# Patient Record
Sex: Male | Born: 1945 | Race: White | Hispanic: No | Marital: Married | Smoking: Current every day smoker
Health system: Southern US, Community
[De-identification: ages and names within clinical notes are randomized; demographics above are authoritative.]

## PROBLEM LIST (undated history)

## (undated) DIAGNOSIS — G4733 Obstructive sleep apnea (adult) (pediatric): Secondary | ICD-10-CM

## (undated) DIAGNOSIS — I10 Essential (primary) hypertension: Secondary | ICD-10-CM

## (undated) DIAGNOSIS — G250 Essential tremor: Secondary | ICD-10-CM

## (undated) DIAGNOSIS — E785 Hyperlipidemia, unspecified: Secondary | ICD-10-CM

## (undated) DIAGNOSIS — J441 Chronic obstructive pulmonary disease with (acute) exacerbation: Secondary | ICD-10-CM

## (undated) DIAGNOSIS — R809 Proteinuria, unspecified: Secondary | ICD-10-CM

## (undated) DIAGNOSIS — C159 Malignant neoplasm of esophagus, unspecified: Secondary | ICD-10-CM

## (undated) DIAGNOSIS — F32A Depression, unspecified: Secondary | ICD-10-CM

## (undated) DIAGNOSIS — M1990 Unspecified osteoarthritis, unspecified site: Secondary | ICD-10-CM

## (undated) DIAGNOSIS — C801 Malignant (primary) neoplasm, unspecified: Secondary | ICD-10-CM

## (undated) DIAGNOSIS — F329 Major depressive disorder, single episode, unspecified: Secondary | ICD-10-CM

## (undated) DIAGNOSIS — I2510 Atherosclerotic heart disease of native coronary artery without angina pectoris: Secondary | ICD-10-CM

## (undated) DIAGNOSIS — R0602 Shortness of breath: Secondary | ICD-10-CM

## (undated) DIAGNOSIS — F439 Reaction to severe stress, unspecified: Secondary | ICD-10-CM

## (undated) DIAGNOSIS — E291 Testicular hypofunction: Secondary | ICD-10-CM

## (undated) DIAGNOSIS — E119 Type 2 diabetes mellitus without complications: Secondary | ICD-10-CM

## (undated) DIAGNOSIS — J438 Other emphysema: Secondary | ICD-10-CM

## (undated) DIAGNOSIS — E7800 Pure hypercholesterolemia, unspecified: Secondary | ICD-10-CM

## (undated) HISTORY — DX: Obstructive sleep apnea (adult) (pediatric): G47.33

## (undated) HISTORY — DX: Type 2 diabetes mellitus without complications: E11.9

## (undated) HISTORY — DX: Depression, unspecified: F32.A

## (undated) HISTORY — DX: Testicular hypofunction: E29.1

## (undated) HISTORY — DX: Essential (primary) hypertension: I10

## (undated) HISTORY — DX: Hyperlipidemia, unspecified: E78.5

## (undated) HISTORY — PX: TONSILLECTOMY: SUR1361

## (undated) HISTORY — DX: Essential tremor: G25.0

## (undated) HISTORY — PX: CARDIAC CATHETERIZATION: SHX172

## (undated) HISTORY — DX: Shortness of breath: R06.02

## (undated) HISTORY — DX: Pure hypercholesterolemia, unspecified: E78.00

## (undated) HISTORY — PX: TONSILLECTOMY AND ADENOIDECTOMY: SHX28

## (undated) HISTORY — DX: Chronic obstructive pulmonary disease with (acute) exacerbation: J44.1

## (undated) HISTORY — DX: Atherosclerotic heart disease of native coronary artery without angina pectoris: I25.10

## (undated) HISTORY — PX: OTHER SURGICAL HISTORY: SHX169

## (undated) HISTORY — DX: Other emphysema: J43.8

## (undated) HISTORY — DX: Reaction to severe stress, unspecified: F43.9

---
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
## 2000-01-11 ENCOUNTER — Encounter: Admission: RE | Admit: 2000-01-11 | Discharge: 2000-01-11 | Payer: Self-pay | Admitting: Orthopedic Surgery

## 2000-01-11 ENCOUNTER — Encounter: Payer: Self-pay | Admitting: Orthopedic Surgery

## 2000-05-10 ENCOUNTER — Encounter: Payer: Self-pay | Admitting: Family Medicine

## 2000-05-10 ENCOUNTER — Encounter: Admission: RE | Admit: 2000-05-10 | Discharge: 2000-05-10 | Payer: Self-pay | Admitting: Family Medicine

## 2000-08-26 ENCOUNTER — Inpatient Hospital Stay (HOSPITAL_COMMUNITY): Admission: RE | Admit: 2000-08-26 | Discharge: 2000-08-29 | Payer: Self-pay | Admitting: Orthopedic Surgery

## 2000-09-13 ENCOUNTER — Ambulatory Visit (HOSPITAL_COMMUNITY): Admission: RE | Admit: 2000-09-13 | Discharge: 2000-09-13 | Payer: Self-pay | Admitting: Family Medicine

## 2002-07-03 ENCOUNTER — Inpatient Hospital Stay (HOSPITAL_COMMUNITY): Admission: EM | Admit: 2002-07-03 | Discharge: 2002-07-07 | Payer: Self-pay

## 2002-09-14 ENCOUNTER — Encounter: Admission: RE | Admit: 2002-09-14 | Discharge: 2002-09-14 | Payer: Self-pay | Admitting: Family Medicine

## 2002-09-14 ENCOUNTER — Encounter: Payer: Self-pay | Admitting: Family Medicine

## 2003-09-03 ENCOUNTER — Encounter: Admission: RE | Admit: 2003-09-03 | Discharge: 2003-09-03 | Payer: Self-pay | Admitting: Orthopedic Surgery

## 2003-10-02 ENCOUNTER — Inpatient Hospital Stay (HOSPITAL_COMMUNITY): Admission: EM | Admit: 2003-10-02 | Discharge: 2003-10-07 | Payer: Self-pay | Admitting: Emergency Medicine

## 2003-12-29 ENCOUNTER — Inpatient Hospital Stay (HOSPITAL_COMMUNITY): Admission: RE | Admit: 2003-12-29 | Discharge: 2004-01-01 | Payer: Self-pay | Admitting: Orthopedic Surgery

## 2005-12-17 ENCOUNTER — Observation Stay (HOSPITAL_COMMUNITY): Admission: EM | Admit: 2005-12-17 | Discharge: 2005-12-18 | Payer: Self-pay | Admitting: Emergency Medicine

## 2005-12-17 ENCOUNTER — Ambulatory Visit: Payer: Self-pay | Admitting: Cardiology

## 2005-12-31 ENCOUNTER — Ambulatory Visit: Payer: Self-pay | Admitting: Cardiology

## 2006-01-30 ENCOUNTER — Ambulatory Visit: Payer: Self-pay | Admitting: Gastroenterology

## 2006-02-13 ENCOUNTER — Ambulatory Visit (HOSPITAL_COMMUNITY): Admission: RE | Admit: 2006-02-13 | Discharge: 2006-02-13 | Payer: Self-pay | Admitting: Gastroenterology

## 2006-08-24 ENCOUNTER — Encounter: Admission: RE | Admit: 2006-08-24 | Discharge: 2006-08-24 | Payer: Self-pay | Admitting: Orthopedic Surgery

## 2006-11-06 ENCOUNTER — Encounter: Admission: RE | Admit: 2006-11-06 | Discharge: 2006-11-06 | Payer: Self-pay | Admitting: Family Medicine

## 2007-01-01 ENCOUNTER — Ambulatory Visit: Payer: Self-pay | Admitting: Cardiology

## 2008-02-24 ENCOUNTER — Ambulatory Visit: Payer: Self-pay

## 2008-03-31 ENCOUNTER — Ambulatory Visit: Payer: Self-pay | Admitting: Cardiology

## 2008-04-01 ENCOUNTER — Ambulatory Visit: Payer: Self-pay

## 2008-04-12 ENCOUNTER — Encounter: Admission: RE | Admit: 2008-04-12 | Discharge: 2008-05-10 | Payer: Self-pay | Admitting: Family Medicine

## 2008-07-07 ENCOUNTER — Encounter: Admission: RE | Admit: 2008-07-07 | Discharge: 2008-07-07 | Payer: Self-pay | Admitting: Family Medicine

## 2009-01-31 ENCOUNTER — Encounter (INDEPENDENT_AMBULATORY_CARE_PROVIDER_SITE_OTHER): Payer: Self-pay | Admitting: *Deleted

## 2009-08-08 ENCOUNTER — Encounter: Payer: Self-pay | Admitting: Pulmonary Disease

## 2009-08-31 ENCOUNTER — Ambulatory Visit: Payer: Self-pay | Admitting: Pulmonary Disease

## 2009-08-31 DIAGNOSIS — E119 Type 2 diabetes mellitus without complications: Secondary | ICD-10-CM | POA: Insufficient documentation

## 2009-08-31 DIAGNOSIS — I10 Essential (primary) hypertension: Secondary | ICD-10-CM | POA: Insufficient documentation

## 2009-08-31 DIAGNOSIS — E118 Type 2 diabetes mellitus with unspecified complications: Secondary | ICD-10-CM | POA: Insufficient documentation

## 2009-08-31 DIAGNOSIS — E785 Hyperlipidemia, unspecified: Secondary | ICD-10-CM | POA: Insufficient documentation

## 2009-08-31 DIAGNOSIS — J441 Chronic obstructive pulmonary disease with (acute) exacerbation: Secondary | ICD-10-CM | POA: Insufficient documentation

## 2009-08-31 DIAGNOSIS — G4733 Obstructive sleep apnea (adult) (pediatric): Secondary | ICD-10-CM | POA: Insufficient documentation

## 2009-08-31 DIAGNOSIS — R0602 Shortness of breath: Secondary | ICD-10-CM | POA: Insufficient documentation

## 2009-09-21 ENCOUNTER — Ambulatory Visit: Payer: Self-pay | Admitting: Pulmonary Disease

## 2009-09-21 ENCOUNTER — Encounter: Payer: Self-pay | Admitting: Pulmonary Disease

## 2009-09-21 DIAGNOSIS — J438 Other emphysema: Secondary | ICD-10-CM | POA: Insufficient documentation

## 2009-09-29 DIAGNOSIS — I2510 Atherosclerotic heart disease of native coronary artery without angina pectoris: Secondary | ICD-10-CM | POA: Insufficient documentation

## 2009-10-20 ENCOUNTER — Ambulatory Visit: Payer: Self-pay | Admitting: Cardiology

## 2009-10-20 DIAGNOSIS — F17200 Nicotine dependence, unspecified, uncomplicated: Secondary | ICD-10-CM | POA: Insufficient documentation

## 2009-12-28 ENCOUNTER — Telehealth (INDEPENDENT_AMBULATORY_CARE_PROVIDER_SITE_OTHER): Payer: Self-pay | Admitting: *Deleted

## 2009-12-28 ENCOUNTER — Telehealth: Payer: Self-pay | Admitting: Cardiology

## 2010-01-23 ENCOUNTER — Telehealth (INDEPENDENT_AMBULATORY_CARE_PROVIDER_SITE_OTHER): Payer: Self-pay | Admitting: *Deleted

## 2010-01-26 ENCOUNTER — Ambulatory Visit (HOSPITAL_COMMUNITY)
Admission: RE | Admit: 2010-01-26 | Discharge: 2010-01-27 | Payer: Self-pay | Source: Home / Self Care | Admitting: Orthopedic Surgery

## 2010-03-03 ENCOUNTER — Encounter: Admission: RE | Admit: 2010-03-03 | Discharge: 2010-05-10 | Payer: Self-pay | Admitting: Orthopedic Surgery

## 2010-07-04 ENCOUNTER — Telehealth: Payer: Self-pay | Admitting: Cardiology

## 2010-07-04 ENCOUNTER — Encounter: Payer: Self-pay | Admitting: Cardiology

## 2010-07-12 ENCOUNTER — Ambulatory Visit: Payer: Self-pay | Admitting: Cardiology

## 2010-07-12 DIAGNOSIS — R0789 Other chest pain: Secondary | ICD-10-CM | POA: Insufficient documentation

## 2010-07-17 ENCOUNTER — Telehealth (INDEPENDENT_AMBULATORY_CARE_PROVIDER_SITE_OTHER): Payer: Self-pay

## 2010-07-18 ENCOUNTER — Encounter: Payer: Self-pay | Admitting: Cardiovascular Disease

## 2010-07-18 ENCOUNTER — Encounter (HOSPITAL_COMMUNITY): Admission: RE | Admit: 2010-07-18 | Payer: Self-pay | Admitting: Cardiology

## 2010-07-18 ENCOUNTER — Ambulatory Visit: Payer: Self-pay | Admitting: Cardiovascular Disease

## 2010-07-18 ENCOUNTER — Ambulatory Visit: Payer: Self-pay

## 2010-07-20 ENCOUNTER — Telehealth: Payer: Self-pay | Admitting: Cardiology

## 2010-09-02 ENCOUNTER — Encounter: Payer: Self-pay | Admitting: Cardiology

## 2010-09-04 ENCOUNTER — Encounter: Payer: Self-pay | Admitting: Physician Assistant

## 2010-09-04 ENCOUNTER — Ambulatory Visit: Payer: Self-pay | Admitting: Physician Assistant

## 2010-09-05 ENCOUNTER — Ambulatory Visit: Payer: Self-pay | Admitting: Pulmonary Disease

## 2010-09-06 ENCOUNTER — Telehealth: Payer: Self-pay | Admitting: Cardiology

## 2010-09-12 LAB — CONVERTED CEMR LAB
BUN: 17 mg/dL (ref 6–23)
CO2: 27 meq/L (ref 19–32)
Calcium: 9.7 mg/dL (ref 8.4–10.5)
Chloride: 96 meq/L (ref 96–112)
Creatinine, Ser: 0.9 mg/dL (ref 0.4–1.5)
GFR calc non Af Amer: 93.44 mL/min (ref >60.00)
Glucose, Bld: 255 mg/dL — ABNORMAL HIGH (ref 70–99)
Potassium: 4.2 meq/L (ref 3.5–5.1)
Pro B Natriuretic peptide (BNP): 18.3 pg/mL (ref 0.0–100.0)
Sodium: 134 meq/L — ABNORMAL LOW (ref 135–145)

## 2010-09-13 ENCOUNTER — Ambulatory Visit (HOSPITAL_COMMUNITY)
Admission: RE | Admit: 2010-09-13 | Discharge: 2010-09-13 | Payer: Self-pay | Source: Home / Self Care | Attending: Cardiology | Admitting: Cardiology

## 2010-09-13 ENCOUNTER — Ambulatory Visit: Payer: Self-pay

## 2010-09-13 ENCOUNTER — Encounter: Payer: Self-pay | Admitting: Cardiology

## 2010-09-15 ENCOUNTER — Encounter: Payer: Self-pay | Admitting: Physician Assistant

## 2010-09-20 ENCOUNTER — Ambulatory Visit: Admit: 2010-09-20 | Payer: Self-pay | Admitting: Physician Assistant

## 2010-09-21 ENCOUNTER — Telehealth: Payer: Self-pay | Admitting: Cardiology

## 2010-09-26 ENCOUNTER — Ambulatory Visit
Admission: RE | Admit: 2010-09-26 | Discharge: 2010-09-26 | Payer: Self-pay | Source: Home / Self Care | Attending: Adult Health | Admitting: Adult Health

## 2010-10-08 ENCOUNTER — Encounter: Payer: Self-pay | Admitting: Gastroenterology

## 2010-10-19 NOTE — Progress Notes (Signed)
Summary: Nuc. Pre-Procedure  Phone Note Outgoing Call Call back at [facility] Phone [phone]   Call placed by: Irean Hong, RN,  July 17, 2010 2:39 PM Summary of Call: Left message with information on Myov[employer] Information Sheet (see scanned document for details).      Nuclear Med Background Indications for Stress Test: Evaluation for Ischemia   History: COPD, Emphysema, Heart Catheterization, Myocardial Perfusion Study  History Comments: 4/07 Cath: N/O CAD, EF NL. 7/09 MPS: Low risk, EF=57%.  Symptoms: Chest Pressure  Symptoms Comments: Radiates to neck.   Nuclear Pre-Procedure Cardiac Risk Factors: Family History - CAD, Hypertension, Lipids, NIDDM, Smoker Height (in): 67

## 2010-10-19 NOTE — Miscellaneous (Signed)
  Clinical Lists Changes  Observations: Added new observation of PAST MED HX: CAD, NATIVE VESSEL (ICD-414.01) EMPHYSEMA (ICD-492.8) DYSPNEA (ICD-786.05) CHRONIC OBSTRUCTIVE PULMONARY DISEASE, ACUTE EXACERBATION (ICD-491.21) OBSTRUCTIVE SLEEP APNEA (ICD-327.23) DM (ICD-250.00) HYPERLIPIDEMIA (ICD-272.4) HYPERTENSION (ICD-401.9) Echo 08/2010: EF 55-60%; Grade 1 Diast. Dysfxn; mild BAE  (09/15/2010 9:10)       Past History:  Past Medical History: CAD, NATIVE VESSEL (ICD-414.01) EMPHYSEMA (ICD-492.8) DYSPNEA (ICD-786.05) CHRONIC OBSTRUCTIVE PULMONARY DISEASE, ACUTE EXACERBATION (ICD-491.21) OBSTRUCTIVE SLEEP APNEA (ICD-327.23) DM (ICD-250.00) HYPERLIPIDEMIA (ICD-272.4) HYPERTENSION (ICD-401.9) Echo 08/2010: EF 55-60%; Grade 1 Diast. Dysfxn; mild BAE

## 2010-10-19 NOTE — Progress Notes (Signed)
Summary: surgical clearance  Phone Note Call from Patient Call back at Home Phone [phone] Call back at [phone]   Caller: Patient Call For: clance Reason for Call: Talk to Nurse Summary of Call: pt needs surgical clearance for shoulder surgery sent to [facility].  Dr. Rennis Chris  -  Fax # [phone] Initial call taken by: Eugene Gavia,  December 28, 2009 3:14 PM  Follow-up for Phone Call        Pt last seen in 09/2009. Pt was to return in 4 months. Please advise if pt will need a sooner appt to get surgical clearance.Michel Bickers [facility]  December 28, 2009 4:09 PM  Additional Follow-up for Phone Call Additional follow up Details #1::        pt has minimal lung disease, and no reason he can't have surgery.  needs to quit smoking 2 weeks prior to general anesthesia. doesn't neeed ov for clearance. Additional Follow-up by: Barbaraann Share MD,  December 28, 2009 5:34 PM    Additional Follow-up for Phone Call Additional follow up Details #2::    This phone note faxed to Dr Tami Lin with Dr Teddy Spike opinion. Follow-up by: Abigail Miyamoto RN,  December 29, 2009 9:21 AM

## 2010-10-19 NOTE — Assessment & Plan Note (Signed)
Summary: rov to review pfts for w/u of doe   Copy to:  Justin Trujillo Primary Provider/Referring Provider:  Nadyne Trujillo  CC:  Pt is here for a f/u appt to discuss PFT results.  Pt states breathing has improved but unsure if it's the Spiriva or Symbicort that has helped.  Pt states he is not needing to use his nebulizer as much.  Pt states he has decreased his smoking to 1/4 ppd or less. Justin Trujillo  History of Present Illness: The pt comes in today for review of his full pfts.  At the last visit, his bronchodilator regimen was intensifed, he was treated with a course of prednisone, and was urged to stop smoking.  He has cut back since the last visit, but not quit.  His pfts show no obstruction by FEV1%, but he did have mild airtrapping on his lung volumes.  There was no restriction, and his DLCO was normal.  I have gone over the study with him in detail, and answered all of his questions.  Current Medications (verified): 1)  Wellbutrin Sr 150 Mg Xr12h-Tab (Bupropion Hcl) .... Take 1 Tablet By Mouth Two Times A Day 2)  Hydrochlorothiazide 25 Mg Tabs (Hydrochlorothiazide) .... Take 1 Tablet By Mouth Once A Day 3)  Crestor 10 Mg  Tabs (Rosuvastatin Calcium) .... Take 1 Tablet By Mouth Once A Day 4)  Lotensin 10 Mg Tabs (Benazepril Hcl) .... Take 1 Tablet By Mouth Once A Day 5)  Actoplus Met 15-850 Mg Tabs (Pioglitazone Hcl-Metformin Hcl) .... Take 1 Tablet By Mouth Two Times A Day 6)  Spiriva Handihaler 18 Mcg  Caps (Tiotropium Bromide Monohydrate) .... Inhale Contents of 1 Capsule Once A Day 7)  Albuterol Sulfate (2.5 Mg/62ml) 0.083% Nebu (Albuterol Sulfate) .Justin Trujillo.. 1 Vial in Nebulizer As Needed 8)  Mucinex 600 Mg Xr12h-Tab (Guaifenesin) 9)  Flonase 50 Mcg/act  Susp (Fluticasone Propionate) .Justin Trujillo.. 1 Spray in Each Nostril Once Daily 10)  Vicodin .... As Needed 11)  Percocet .... As Needed 12)  Symbicort 160-4.5 Mcg/act  Aero (Budesonide-Formoterol Fumarate) .... Two Puffs Twice Daily 13)  Mobic 15 Mg Tabs  (Meloxicam) .... As Needed 14)  Flexeril 5 Mg Tabs (Cyclobenzaprine Hcl) .... As Needed 15)  Combivent 103-18 Mcg/act Aero (Ipratropium-Albuterol) .... As Needed  Allergies (verified): 1)  ! * Ivp Dye 2)  ! * Latex 3)  ! Adhesive Tape  Social History: Patient is a current smoker.  started 1966.  prev smoked 3 ppd.  currently at 1/4 ppd or less. pt is married. pt works as a Engineer, maintenance (IT).  Vital Signs:  Patient profile:   65 year old male Height:      67 inches Weight:      268 pounds BMI:     42.13 O2 Sat:      93 % on Room air Temp:     97.5 degrees F oral Pulse rate:   92 / minute BP sitting:   124 / 82  (left arm) Cuff size:   large  Vitals Entered By: Arman Filter LPN (September 21, 2009 10:14 AM)  O2 Flow:  Room air CC: Pt is here for a f/u appt to discuss PFT results.  Pt states breathing has improved but unsure if it's the Spiriva or Symbicort that has helped.  Pt states he is not needing to use his nebulizer as much.  Pt states he has decreased his smoking to 1/4 ppd or less.  Comments Medications reviewed with patient Aundra Millet  Reynolds LPN  September 21, 2009 10:15 AM    Physical Exam  General:  obese male in nad   Impression & Recommendations:  Problem # 1:  DYSPNEA (ICD-786.05)  the pt surprisingly has only mild airflow obstruction manifested as airtrapping.  I suspect he does have some emphysema, but the majority of the problem is ongoing airway inflammation from his smoking.  I have stressed to him the importance of total smoking cessation, and will keep him on symbicort for airway inflammation until he can quit smoking.  Regarding his sob, his symptoms are out of proportion to his objective findings on pfts.  Therefore, either his weight and conditioning are playing major roles, or could he have underlying cardiac disease that is contributing?  I have asked him to make an appt with Dr. Daleen Squibb for a checkup, and will leave to his discretion whether to pursue  noninvasive testing.  Time spent with pt today discussing the above was  Medications Added to Medication List This Visit: 1)  Albuterol Sulfate (2.5 Mg/66ml) 0.083% Nebu (Albuterol sulfate) .Justin Trujillo.. 1 vial in nebulizer as needed 2)  Symbicort 160-4.5 Mcg/act Aero (Budesonide-formoterol fumarate) .... Two puffs twice daily 3)  Mobic 15 Mg Tabs (Meloxicam) .... As needed 4)  Flexeril 5 Mg Tabs (Cyclobenzaprine hcl) .... As needed 5)  Combivent 103-18 Mcg/act Aero (Ipratropium-albuterol) .... As needed  Other Orders: Est. Patient Level III (16109)  Patient Instructions: 1)  stay on symbicort and combivent for rescue 2)  stop spiriva 3)  stop smoking 4)  work on weight loss and conditioning. 5)  make an apptm with Dr. Daleen Squibb to check on your cardiac condition 6)  Please schedule a follow-up appointment in 4 months.   Prescriptions: SYMBICORT 160-4.5 MCG/ACT  AERO (BUDESONIDE-FORMOTEROL FUMARATE) Two puffs twice daily  #1 x 6   Entered and Authorized by:   Barbaraann Share MD   Signed by:   Barbaraann Share MD on 09/21/2009   Method used:   Print then Give to Patient   RxID:   [id number]

## 2010-10-19 NOTE — Assessment & Plan Note (Signed)
Summary: Acute NP office visit - bronchitis   Copy to:  Nadyne Coombes Primary Provider/Referring Provider:  Nadyne Coombes  CC:  went to UC x3 w/ URI symptoms: laryngitis, prod cough with yellow/white mucus, wheezing, DOE, and tightness in chest x4-6weeks - was given zpak and pred taper from UC.  History of Present Illness: 64 yo with known hx of COPD   09/2009--The pt comes in today for review of his full pfts.  At the last visit, his bronchodilator regimen was intensifed, he was treated with a course of prednisone, and was urged to stop smoking.  He has cut back since the last visit, but not quit.  His pfts show no obstruction by FEV1%, but he did have mild airtrapping on his lung volumes.  There was no restriction, and his DLCO was normal.  I have gone over the study with him in detail, and answered all of his questions.  September 05, 2010--Presents for an acute office visit. Complains of cough x 6 weeks. Has  went to UC x3 w/ URI symptoms: laryngitis, prod cough with yellow/white mucus, wheezing, DOE, tightness in chest x4-6weeks - was given zpak and pred taper from UC. Reports he had 2 xray with no PNA or acute findings. No records are available.  Has couple of days left of steroids and zpack.  Spiriva was stopped in 09/2009, however pt restarted on own 1 month ago.  Has decreased smoking 3/4 ppd (prev. has been as much as 3 ppd).  Denies chest pain,  orthopnea, hemoptysis, fever, n/v/d, edema, headache.   Medications Prior to Update: 1)  Hydrochlorothiazide 25 Mg Tabs (Hydrochlorothiazide) .... Take 1 Tablet By Mouth Once A Day 2)  Crestor 10 Mg  Tabs (Rosuvastatin Calcium) .... Take 1 Tablet By Mouth Once A Day 3)  Lotensin 10 Mg Tabs (Benazepril Hcl) .... Take 1 Tablet By Mouth Once A Day 4)  Actoplus Met 15-850 Mg Tabs (Pioglitazone Hcl-Metformin Hcl) .... Take 1 Tablet By Mouth Two Times A Day 5)  Albuterol Sulfate (2.5 Mg/59ml) 0.083% Nebu (Albuterol Sulfate) .Marland Kitchen.. 1 Vial in Nebulizer  As Needed 6)  Vicodin Es 7.5-750 Mg Tabs (Hydrocodone-Acetaminophen) .... As Needed 7)  Percocet 10-325 Mg Tabs (Oxycodone-Acetaminophen) .... As Needed 8)  Mobic 15 Mg Tabs (Meloxicam) .... As Needed 9)  Flexeril 10 Mg Tabs (Cyclobenzaprine Hcl) .... As Needed 10)  Combivent 103-18 Mcg/act Aero (Ipratropium-Albuterol) .... As Needed 11)  Androgel Pump 1.25 Gm/act (1%) Gel (Testosterone) .... As Directed 12)  Cialis 5 Mg Tabs (Tadalafil) .Marland Kitchen.. 1 Tab Once Daily 13)  Symbicort 80-4.5 Mcg/act Aero (Budesonide-Formoterol Fumarate) .... Daily 14)  Spiriva Handihaler 18 Mcg Caps (Tiotropium Bromide Monohydrate) .... Daily 15)  Prednisone (Pak) 10 Mg Tabs (Prednisone) .... Uad  Current Medications (verified): 1)  Hydrochlorothiazide 25 Mg Tabs (Hydrochlorothiazide) .... Take 1 Tablet By Mouth Once A Day 2)  Crestor 10 Mg  Tabs (Rosuvastatin Calcium) .... Take 1 Tablet By Mouth Once A Day 3)  Lotensin 10 Mg Tabs (Benazepril Hcl) .... Take 1 Tablet By Mouth Once A Day 4)  Actoplus Met 15-850 Mg Tabs (Pioglitazone Hcl-Metformin Hcl) .... Take 1 Tablet By Mouth Two Times A Day 5)  Albuterol Sulfate (2.5 Mg/25ml) 0.083% Nebu (Albuterol Sulfate) .Marland Kitchen.. 1 Vial in Nebulizer As Needed 6)  Vicodin Es 7.5-750 Mg Tabs (Hydrocodone-Acetaminophen) .... As Needed 7)  Percocet 10-325 Mg Tabs (Oxycodone-Acetaminophen) .... As Needed 8)  Mobic 15 Mg Tabs (Meloxicam) .... As Needed 9)  Flexeril 10 Mg Tabs (Cyclobenzaprine  Hcl) .... As Needed 10)  Combivent 103-18 Mcg/act Aero (Ipratropium-Albuterol) .... As Needed 11)  Androgel Pump 1.25 Gm/act (1%) Gel (Testosterone) .... As Directed 12)  Symbicort 80-4.5 Mcg/act Aero (Budesonide-Formoterol Fumarate) .... Daily 13)  Spiriva Handihaler 18 Mcg Caps (Tiotropium Bromide Monohydrate) .... Daily 14)  Prednisone (Pak) 10 Mg Tabs (Prednisone) .... Uad  Allergies (verified): 1)  ! * Ivp Dye 2)  ! * Avelox 3)  ! * Latex 4)  ! Adhesive Tape  Past History:  Past Medical  History: Last updated: 09/29/2009 CAD, NATIVE VESSEL (ICD-414.01) EMPHYSEMA (ICD-492.8) DYSPNEA (ICD-786.05) CHRONIC OBSTRUCTIVE PULMONARY DISEASE, ACUTE EXACERBATION (ICD-491.21) OBSTRUCTIVE SLEEP APNEA (ICD-327.23) DM (ICD-250.00) HYPERLIPIDEMIA (ICD-272.4) HYPERTENSION (ICD-401.9)    Past Surgical History: Last updated: 09/29/2009 L lens implant vascectomy R knee replacement x 2 tonsillectomy and adenoids  Family History: Last updated: 08/31/2009 emphysema: father, mother heart disease: father (m.i.), paternal side of family clotting disorders: maternal side of family   Social History: Last updated: 09/21/2009 Patient is a current smoker.  started 1966.  prev smoked 3 ppd.  currently at 1/4 ppd or less. pt is married. pt works as a Engineer, maintenance (IT).  Risk Factors: Smoking Status: current (08/31/2009)  Review of Systems      See HPI  Vital Signs:  Patient profile:   65 year old male Height:      67 inches Weight:      265.13 pounds BMI:     41.68 O2 Sat:      96 % on Room air Temp:     97.1 degrees F oral Pulse rate:   111 / minute BP sitting:   140 / 90  (right arm) Cuff size:   large  Vitals Entered By: Boone Master CNA/MA (September 05, 2010 4:05 PM)  O2 Flow:  Room air CC: went to UC x3 w/ URI symptoms: laryngitis, prod cough with yellow/white mucus, wheezing, DOE, tightness in chest x4-6weeks - was given zpak and pred taper from UC Is Patient Diabetic? No Comments Medications reviewed with patient Daytime contact number verified with patient. Boone Master CNA/MA  September 05, 2010 4:07 PM    Physical Exam  Additional Exam:  GEN: A/Ox3; pleasant , NAD HEENT:  [place]/AT, , EACs-clear, TMs-wnl, NOSE-clear, THROAT-clear NECK:  Supple w/ fair ROM; no JVD; normal carotid impulses w/o bruits; no thyromegaly or nodules palpated; no lymphadenopathy. RESP  Coarse BS w/ upper airway psuedowheezing  CARD:  RRR, no m/r/g   GI:   Soft & nt; nml bowel  sounds; no organomegaly or masses detected. 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  ace inhibitor may be making the cough worse.  will hold for now, consider restart on return if cough free or refer back to PCP.  ACE inhibitors may not be good choice for him if he continues to smoke which increases  his risk for URI/bronchitis.  Plan:  Stop spiriva  Stop Lotensin  Begin Diovan 80mg  once daily  Mucinex DM two times a day for cough  Tessalon three times a day as needed cough  Zyrtec 10mg  at bedtime -this is for sinus drainge Begin Prilosec 20mg  once daily before meal-this protects upper airway from reflux.  Try not to cough, or clear throat. Use sugarless candy, ice chips, water---NO MINTS.  follow up  3 weeks and as needed  Please contact office for sooner follow up if symptoms do not improve or worsen  finiish antibitoics and prednisone as directed.   Orders: Est. Patient Level IV (04540)  Medications Added to Medication List This Visit: 1)  Diovan 80 Mg Tabs (Valsartan) .Marland Kitchen.. 1 by mouth once daily 2)  Tessalon 200 Mg Caps (Benzonatate) .Marland Kitchen.. 1 by mouth three times a day as needed cough  Patient Instructions: 1)  Stop Spiriva  2)  Stop Lotensin  3)  Begin Diovan 80mg  once daily  4)  Mucinex DM two times a day for cough  5)  Tessalon three times a day as needed cough  6)  Zyrtec 10mg  at bedtime -this is for sinus drainge 7)  Begin Prilosec 20mg  once daily before meal-this protects upper airway from reflux.  8)  Try not to cough, or clear throat. Use sugarless candy, ice chips, water---NO MINTS.  9)  follow up 3 weeks and as needed  10)  Please contact office for sooner follow up if symptoms do not improve or worsen  11)  finiish antibitoics and  prednisone as directed.  Prescriptions: TESSALON 200 MG CAPS (BENZONATATE) 1 by mouth three times a day as needed cough  #45 x 1   Entered and Authorized by:   Rubye Oaks NP   Signed by:   Christinia Lambeth NP on 09/05/2010   Method used:   Electronically to        [id number] (retail)       [address] [place], [place]  [address]       Ph: [phone]       Fax: [phone]   RxID:   [id number]    Immunization History:  Influenza Immunization History:    Influenza:  historical (06/17/2010)  Pneumovax Immunization History:    Pneumovax:  historical (06/17/2010)   Appended Document: Acute NP office visit - bronchitis advise on smoking cesstation

## 2010-10-19 NOTE — Assessment & Plan Note (Signed)
Summary: rov./ dod called on 10/18 by dr. Myrtis Ser. gd   Visit Type:  rov Referring Provider:  Nadyne Coombes Primary Provider:  Nadyne Coombes  CC:  pt states he has had some "unusual chest discomfort" says this is like a numbness at times and radiates to his carotid artery at times says this usually happens in the morning...denies any sob or edema.Marland KitchenMarland Kitchenpt has lost 14 lb since 10/2009.Marland Kitchen  History of Present Illness: Justin Trujillo with chest pressure radiating to his left carotid. He has a history of nonobstructive coronary disease by cath in 2003.  He was seen by primary care where his EKG showed no acute changes.  It is clearly not exertional. He describes a heaviness and can radiate to the left neck. No nausea vomiting or diaphoresis. He's has had a lot of stress which precipitates it.  Last stress test was in 2009 which was low risk. He is a high-risk candidate however for coronary event with his risk factors.  Clinical Reports Reviewed:  Cardiac Cath:  12/18/2005: Cardiac Cath Findings:   CONCLUSIONS:  Nonobstructive coronary artery disease with 20% narrowing of  the ostium of the left main coronary artery, 40% narrowing in the mid left  anterior descending artery with 40% narrowing of the first diagonal branch,  20% narrowing in the proximal circumflex artery and 50% narrowing in the  distal circumflex artery, no significant disease in the right coronary  artery, and normal left ventricular function.   RECOMMENDATIONS:  The patient has only mild nonobstructive coronary artery  disease which has not progressed from the prior study and in some areas  looks a little better.  We will plan on continuing secondary risk factor  modification and reassurance.         ______________________________  Charlies Constable, M.D. [facility]  07/06/2002: Cardiac Cath Findings:   CONCLUSIONS:  1. Normal left ventricular systolic function with evidence of elevated left     ventricular end-diastolic  pressure.  2. Hypertension in the laboratory.  3. Scattered coronary lesions with moderate mid left anterior descending     disease, significant disease of the small diagonal and significant distal     circumflex disease as noted above.   DISPOSITION:  I have discussed the case with Dr. Andrey Campanile and Dr. Daleen Squibb, who  will recommend aggressive medical therapy and the addition of a statin,  appropriate blood pressure control, discontinuation of smoking, and weight  reduction would all be warranted given the patient's risk factors.  CXR:  11/06/2006: CXR Results:    Clinical Data:  Cough.  Smoking history.   CHEST - 2 VIEW:   Comparison:  12/17/05.   Findings:  Two views of the chest show no pneumonia.  There is mild   peribronchial thickening.  The heart is within normal limits in size.    IMPRESSION:   No pneumonia.  Mild peribronchial thickening.    Read By:  Juline Patch,  M.D.   Released By:  Juline Patch,  M.D.  12/17/2005: CXR Results:    Clinical Data:  Cough.  Smoking history.   CHEST - 2 VIEW:   Comparison:  12/17/05.   Findings:  Two views of the chest show no pneumonia.  There is mild   peribronchial thickening.  The heart is within normal limits in size.    IMPRESSION:   No pneumonia.  Mild peribronchial thickening.    Read By:  Juline Patch,  M.D.   Released By:  Gery Pray,  Colon Flattery,  M.D.  Nuclear Study:  04/01/2008:  Excerise capacity: Adenosine study with no exercise  Blood Pressure response:  Clinical symptoms:  ECG impression: No significant ST segment change suggestive of ischemia  Overall impression: Low risk adenosine nuclear study with mild apical thinning and very mild apical ischemia.  Olga Millers, MD   Current Medications (verified): 1)  Hydrochlorothiazide 25 Mg Tabs (Hydrochlorothiazide) .... Take 1 Tablet By Mouth Once A Day 2)  Crestor 10 Mg  Tabs (Rosuvastatin Calcium) .... Take 1 Tablet By Mouth Once A Day 3)  Lotensin 10 Mg Tabs  (Benazepril Hcl) .... Take 1 Tablet By Mouth Once A Day 4)  Actoplus Met 15-850 Mg Tabs (Pioglitazone Hcl-Metformin Hcl) .... Take 1 Tablet By Mouth Two Times A Day 5)  Albuterol Sulfate (2.5 Mg/74ml) 0.083% Nebu (Albuterol Sulfate) .Marland Kitchen.. 1 Vial in Nebulizer As Needed 6)  Vicodin Es 7.5-750 Mg Tabs (Hydrocodone-Acetaminophen) .... As Needed 7)  Percocet 10-325 Mg Tabs (Oxycodone-Acetaminophen) .... As Needed 8)  Mobic 15 Mg Tabs (Meloxicam) .... As Needed 9)  Flexeril 10 Mg Tabs (Cyclobenzaprine Hcl) .... As Needed 10)  Combivent 103-18 Mcg/act Aero (Ipratropium-Albuterol) .... As Needed 11)  Androgel Pump 1.25 Gm/act (1%) Gel (Testosterone) .... As Directed 12)  Cialis 5 Mg Tabs (Tadalafil) .Marland Kitchen.. 1 Tab Once Daily  Allergies: 1)  ! * Ivp Dye 2)  ! * Latex 3)  ! Adhesive Tape  Past History:  Past Medical History: Last updated: 09/29/2009 CAD, NATIVE VESSEL (ICD-414.01) EMPHYSEMA (ICD-492.8) DYSPNEA (ICD-786.05) CHRONIC OBSTRUCTIVE PULMONARY DISEASE, ACUTE EXACERBATION (ICD-491.21) OBSTRUCTIVE SLEEP APNEA (ICD-327.23) DM (ICD-250.00) HYPERLIPIDEMIA (ICD-272.4) HYPERTENSION (ICD-401.9)    Past Surgical History: Last updated: 09/29/2009 L lens implant vascectomy R knee replacement x 2 tonsillectomy and adenoids  Family History: Last updated: 08/31/2009 emphysema: father, mother heart disease: father (m.i.), paternal side of family clotting disorders: maternal side of family   Social History: Last updated: 09/21/2009 Patient is a current smoker.  started 1966.  prev smoked 3 ppd.  currently at 1/4 ppd or less. pt is married. pt works as a Engineer, maintenance (IT).  Risk Factors: Smoking Status: current (08/31/2009)  Review of Systems       negative other than history of present illness  Vital Signs:  Patient profile:   65 year old male Height:      67 inches Weight:      258.4 pounds BMI:     40.62 Pulse rate:   92 / minute Pulse rhythm:   irregular BP sitting:    138 / 82  (left arm) Cuff size:   large  Vitals Entered By: Danielle Rankin, CMA (July 12, 2010 8:59 AM)  Physical Exam  General:  obese.   Head:  normocephalic and atraumatic Eyes:  PERRLA/EOM intact; conjunctiva and lids normal. Neck:  Neck supple, no JVD. No masses, thyromegaly or abnormal cervical nodes. Chest Wall:  no deformities or breast masses noted Lungs:  Clear bilaterally to auscultation and percussion. Heart:  MI nondisplaced, regular rate and rhythm, normal S1-S2, no carotid bruits Msk:  Back normal, normal gait. Muscle strength and tone normal. Pulses:  pulses normal in all 4 extremities Extremities:  No clubbing or cyanosis. Neurologic:  Alert and oriented x 3. Skin:  Intact without lesions or rashes. Psych:  Normal affect.   EKG  Procedure date:  07/12/2010  Findings:      sinus rhythm, left leg enlargement, and no ST segment changes.  Impression & Recommendations:  Problem # 1:  CHEST PAIN, ATYPICAL (ICD-786.59) Assessment New high risk for progressive coronary disease since 2009. We'll repeat stress Myoview His updated medication list for this problem includes:    Lotensin 10 Mg Tabs (Benazepril hcl) .Marland Kitchen... Take 1 tablet by mouth once a day  Orders: Nuclear Stress Test (Nuc Stress Test)  Other Orders: EKG w/ Interpretation (93000)  Patient Instructions: 1)  Your physician recommends that you schedule a follow-up appointment in: 1 year with Dr. wall 2)  Your physician recommends that you continue on your current medications as directed. Please refer to the Current Medication list given to you Trujillo. 3)  Your physician has requested that you have an lexiscan myoview.  For further information please visit [email or web address]  Please follow instruction sheet, as given.

## 2010-10-19 NOTE — Progress Notes (Signed)
Summary: returned call to debbie  Phone Note Call from Patient   Caller: Patient [phone] Reason for Call: Talk to Nurse Summary of Call: pt returning call to debbie Initial call taken by: Glynda Jaeger,  July 20, 2010 3:51 PM  Follow-up for Phone Call        Justin Trujillo is aware of stress test results. Mylo Red RN

## 2010-10-19 NOTE — Progress Notes (Signed)
Summary: cholesterol med increased per your call-fyi  Phone Note Call from Patient   Caller: Patient Reason for Call: Talk to Nurse Summary of Call: pt returned call-pt crestor has been increased to 40 mg per your call Initial call taken by: Glynda Jaeger,  September 21, 2010 4:25 PM  Follow-up for Phone Call        Mr. Gopal is aware of lab results.  He will call back to schedule lab work- fasting lipid & liver in 6 weeks. Mylo Red RN    New/Updated Medications: CRESTOR 40 MG TABS (ROSUVASTATIN CALCIUM) Take 1 tablet at bedtime Prescriptions: CRESTOR 40 MG TABS (ROSUVASTATIN CALCIUM) Take 1 tablet at bedtime  #30 x 11   Entered by:   Lisabeth Devoid RN   Authorized by:   Gaylord Shih, MD, [facility]   Signed by:   Lisabeth Devoid RN on 09/21/2010   Method used:   Electronically to        [facility] [id number]* (retail)       [address] [place], [place]  [address]       Ph: [phone]       Fax: [phone]   RxID:   [id number]   Appended Document: cholesterol med increased per your call-fyi  Reviewed Juanito Doom, MD

## 2010-10-19 NOTE — Assessment & Plan Note (Signed)
Summary: rov per pt call/jss   Visit Type:  rov Referring Provider:  Nadyne Coombes Primary Provider:  Nadyne Coombes  CC:  sob always, some cp when he was coughing, and edema/abdomen.  History of Present Illness: Mr Schiff times a day for evaluation and management of his coronary disease. He continues to suffer from dyspnea on exertion but this is clearly related to his smoking which is ongoing and to his weight. He recently hadt to go on steroids for acute bronchitis and gained 25 pounds. I pointed out to him today this is continuing to be a progressive cycle and will continue to be so as long as he smoking.  He's had no angina or ischemic symptoms otherwise. He did not return for his stress test on his last visit  Current Medications (verified): 1)  Wellbutrin Sr 150 Mg Xr12h-Tab (Bupropion Hcl) .... Take 1 Tablet By Mouth Two Times A Day 2)  Hydrochlorothiazide 25 Mg Tabs (Hydrochlorothiazide) .... Take 1 Tablet By Mouth Once A Day 3)  Crestor 10 Mg  Tabs (Rosuvastatin Calcium) .... Take 1 Tablet By Mouth Once A Day 4)  Lotensin 10 Mg Tabs (Benazepril Hcl) .... Take 1 Tablet By Mouth Once A Day 5)  Actoplus Met 15-850 Mg Tabs (Pioglitazone Hcl-Metformin Hcl) .... Take 1 Tablet By Mouth Two Times A Day 6)  Albuterol Sulfate (2.5 Mg/2ml) 0.083% Nebu (Albuterol Sulfate) .Marland Kitchen.. 1 Vial in Nebulizer As Needed 7)  Flonase 50 Mcg/act  Susp (Fluticasone Propionate) .Marland Kitchen.. 1 Spray in Each Nostril Once Daily 8)  Vicodin Es 7.5-750 Mg Tabs (Hydrocodone-Acetaminophen) .... As Needed 9)  Percocet 10-325 Mg Tabs (Oxycodone-Acetaminophen) .... As Needed 10)  Symbicort 160-4.5 Mcg/act  Aero (Budesonide-Formoterol Fumarate) .... Two Puffs Twice Daily 11)  Mobic 15 Mg Tabs (Meloxicam) .... As Needed 12)  Flexeril 10 Mg Tabs (Cyclobenzaprine Hcl) .... As Needed 13)  Combivent 103-18 Mcg/act Aero (Ipratropium-Albuterol) .... As Needed  Allergies: 1)  ! * Ivp Dye 2)  ! * Latex 3)  ! Adhesive Tape  Past  History:  Past Medical History: Last updated: 09/29/2009 CAD, NATIVE VESSEL (ICD-414.01) EMPHYSEMA (ICD-492.8) DYSPNEA (ICD-786.05) CHRONIC OBSTRUCTIVE PULMONARY DISEASE, ACUTE EXACERBATION (ICD-491.21) OBSTRUCTIVE SLEEP APNEA (ICD-327.23) DM (ICD-250.00) HYPERLIPIDEMIA (ICD-272.4) HYPERTENSION (ICD-401.9)    Past Surgical History: Last updated: 09/29/2009 L lens implant vascectomy R knee replacement x 2 tonsillectomy and adenoids  Family History: Last updated: 08/31/2009 emphysema: father, mother heart disease: father (m.i.), paternal side of family clotting disorders: maternal side of family   Social History: Last updated: 09/21/2009 Patient is a current smoker.  started 1966.  prev smoked 3 ppd.  currently at 1/4 ppd or less. pt is married. pt works as a Engineer, maintenance (IT).  Risk Factors: Smoking Status: current (08/31/2009)  Review of Systems       negative other than history of present illness  Vital Signs:  Patient profile:   65 year old male Height:      67 inches Weight:      272 pounds BMI:     42.76 Pulse rate:   88 / minute Pulse rhythm:   regular BP sitting:   118 / 80  (left arm) Cuff size:   large  Vitals Entered By: Danielle Rankin, CMA (October 20, 2009 9:39 AM)  Physical Exam  General:  obese, pleasant, humerous Head:  normocephalic and atraumatic Eyes:  PERRLA/EOM intact; conjunctiva and lids normal. Neck:  Neck supple, no JVD. No masses, thyromegaly or abnormal cervical nodes. Lungs:  decreased  breath sounds throughout with expiratory rhonchi Heart:  Non-displaced PMI, chest non-tender; regular rate and rhythm, S1, S2 without murmurs, rubs or gallops. Carotid upstroke normal, no bruit. Normal abdominal aortic size, no bruits. Femorals normal pulses, no bruits. Pedals normal pulses. No edema, no varicosities. Msk:  decreased ROM.   Pulses:  pulses normal in all 4 extremities Extremities:  No clubbing or cyanosis. Neurologic:  Alert and  oriented x 3. Skin:  Intact without lesions or rashes. Psych:  Normal affect.   Impression & Recommendations:  Problem # 1:  CAD, NATIVE VESSEL (ICD-414.01) He was again told to stop smoking, lose weight, and to followup closely with his primary care concerning his blood pressure and lipids. I have made no changes medical therapy today. He is having no symptoms of angina or ischemia but his dyspnea on exertion which is probably related to his weight and smoking. His updated medication list for this problem includes:    Lotensin 10 Mg Tabs (Benazepril hcl) .Marland Kitchen... Take 1 tablet by mouth once a day  Problem # 2:  TOBACCO USER (ICD-305.1) Assessment: Unchanged  Patient Instructions: 1)  Your physician recommends that you schedule a follow-up appointment in: 12 MONTHS WITH DR [place] Breeding 2)  Your physician recommends that you continue on your current medications as directed. Please refer to the Current Medication list given to you today.

## 2010-10-19 NOTE — Miscellaneous (Signed)
Summary: Orders Update-PFT CHARGES   Clinical Lists Changes  Orders: Added new Service order of Spirometry (Pre & Post) (94060) - Signed Added new Service order of Lung Volumes (94240) - Signed Added new Service order of Carbon Monoxide diffusing w/capacity (94720) - Signed 

## 2010-10-19 NOTE — Miscellaneous (Signed)
  Clinical Lists Changes  Observations: Added new observation of CARDIO HPI: Nadyne Coombes called today and I spoke to her and has the office DOD.  She explained that the patient had had a numb sensation in his neck that was fleeting over the last several weeks.  There was no EKG change.  It was her understanding that the patient does have some coronary artery disease.  Based on this she was concerned about his symptoms.  She wanted to be sure that we made plans for followup.  This was not urgent. (07/04/2010 13:49) Added new observation of REFERRING MD: Nadyne Coombes (07/04/2010 13:49) Added new observation of PRIMARY MD: Nadyne Coombes (07/04/2010 13:49)      Referring Provider:  Nadyne Coombes Primary Provider:  Nadyne Coombes   History of Present Illness: Nadyne Coombes called today and I spoke to her and has the office DOD.  She explained that the patient had had a numb sensation in his neck that was fleeting over the last several weeks.  There was no EKG change.  It was her understanding that the patient does have some coronary artery disease.  Based on this she was concerned about his symptoms.  She wanted to be sure that we made plans for followup.  This was not urgent.

## 2010-10-19 NOTE — Assessment & Plan Note (Signed)
Summary: rov   Referring Provider:  Nadyne Coombes Primary Provider:  Nadyne Coombes   History of Present Illness: Primary Cardiologist:  Dr. Valera Castle  Justin Trujillo is a 65 year old male with history of nonobstructive CAD, COPD, DM2, HTN and hyperlipidemia.  His last heart cath. in April 2007 demonstrated 40% LAD, 40% D1, 20% proximal and 50% distal CFX and 20% ostial LM stenosis with a normal EF.   His most recent Myoview study was done in November 2011 for chest pain.  This demonstrated an ejection fraction of 53% and no ischemia.  He was seen at urgent care over the weekend with shortness of breath and bronchitic symptoms.  He was placed on antibiotics and prednisone.  An EKG demonstrated T wave inversions in leads 3 and aVF.  He was added onto my schedule today for further evaluation.  The patient has had increased cough and shortness of breath over the last 4-6 weeks.  At some point in the past he was seen by pulmonology and placed on Spiriva and Symbicort.  He stopped using these medicines when his symptoms improved.  He started back on these medicines a couple of weeks ago with his current bout of coughing and shortness of breath.  He denies chest pain.  He's had some discomfort in his chest after coughing.  He does describe some throat tightness that  is constant.  He notes congestion as a cause for this.  He denies fevers.  His sputum production is scant and yellow.  He says that he's had a couple of chest x-rays which have not demonstrated pneumonia or congestive heart failure.  He denies syncope.  Since his symptoms began several weeks ago he's been sleeping in a recliner.  He denies PND.  He has not noticed any lower extremity edema.  His weight at home has been fairly stable over the last several weeks.  Current Medications (verified): 1)  Hydrochlorothiazide 25 Mg Tabs (Hydrochlorothiazide) .... Take 1 Tablet By Mouth Once A Day 2)  Crestor 10 Mg  Tabs (Rosuvastatin Calcium) .... Take  1 Tablet By Mouth Once A Day 3)  Lotensin 10 Mg Tabs (Benazepril Hcl) .... Take 1 Tablet By Mouth Once A Day 4)  Actoplus Met 15-850 Mg Tabs (Pioglitazone Hcl-Metformin Hcl) .... Take 1 Tablet By Mouth Two Times A Day 5)  Albuterol Sulfate (2.5 Mg/65ml) 0.083% Nebu (Albuterol Sulfate) .Marland Kitchen.. 1 Vial in Nebulizer As Needed 6)  Vicodin Es 7.5-750 Mg Tabs (Hydrocodone-Acetaminophen) .... As Needed 7)  Percocet 10-325 Mg Tabs (Oxycodone-Acetaminophen) .... As Needed 8)  Mobic 15 Mg Tabs (Meloxicam) .... As Needed 9)  Flexeril 10 Mg Tabs (Cyclobenzaprine Hcl) .... As Needed 10)  Combivent 103-18 Mcg/act Aero (Ipratropium-Albuterol) .... As Needed 11)  Androgel Pump 1.25 Gm/act (1%) Gel (Testosterone) .... As Directed 12)  Cialis 5 Mg Tabs (Tadalafil) .Marland Kitchen.. 1 Tab Once Daily 13)  Symbicort 80-4.5 Mcg/act Aero (Budesonide-Formoterol Fumarate) .... Daily 14)  Spiriva Handihaler 18 Mcg Caps (Tiotropium Bromide Monohydrate) .... Daily 15)  Prednisone (Pak) 10 Mg Tabs (Prednisone) .... Uad  Allergies (verified): 1)  ! * Ivp Dye 2)  ! * Latex 3)  ! Adhesive Tape  Past History:  Past Medical History: Last updated: 09/29/2009 CAD, NATIVE VESSEL (ICD-414.01) EMPHYSEMA (ICD-492.8) DYSPNEA (ICD-786.05) CHRONIC OBSTRUCTIVE PULMONARY DISEASE, ACUTE EXACERBATION (ICD-491.21) OBSTRUCTIVE SLEEP APNEA (ICD-327.23) DM (ICD-250.00) HYPERLIPIDEMIA (ICD-272.4) HYPERTENSION (ICD-401.9)    Social History: Reviewed history from 09/21/2009 and no changes required. Patient is a current smoker.  started 1966.  prev smoked 3 ppd.  currently at 1/4 ppd or less. pt is married. pt works as a Engineer, maintenance (IT).  Review of Systems       As per  the HPI.  All other systems reviewed and negative.   Vital Signs:  Patient profile:   65 year old male Height:      67 inches Weight:      261 pounds BMI:     41.03 Pulse rate:   108 / minute Resp:     18 per minute BP sitting:   143 / 77  (left arm)  Vitals  Entered By: Marrion Coy, CNA (September 04, 2010 11:03 AM)  Physical Exam  General:  Well nourished, well developed, in no acute distress HEENT: normal Neck: no JVD or HJR Cardiac:  normal S1, S2; rapid RRR; no murmur Lungs:  decreased breath sounds bilat.; exp wheezes throughout; no rales Abd: soft, nontender, no hepatomegaly Ext: trace edema Skin: warm and dry Neuro:  CNs 2-12 intact, no focal abnormalities noted    EKG  Procedure date:  09/04/2010  Findings:      NSR HR 95 right axis NSSTTW changes   Impression & Recommendations:  Problem # 1:  CHRONIC OBSTRUCTIVE PULMONARY DISEASE, ACUTE EXACERBATION (ICD-491.21)  Slow progress with several ICS, bronchodilators, neb tx. and antibiotics. Now on oral prednisone. Suspect all symptoms are related to this.   I suggest he go back and follow up with pulmonary.   We will set him up to follow up with Dr. Shelle Iron who he saw in the past.  Problem # 2:  CAD, NATIVE VESSEL (ICD-414.01)  Nonobs. CAD by cath in 07 and neg. myoview last month. Do not think his symptoms are related to CAD. EKG changes do not persist today. Discussed with Dr. Tenny Craw.  No further ischemic workup at this time. Continue ASA.  Orders: EKG w/ Interpretation (93000) TLB-BMP (Basic Metabolic Panel-BMET) (80048-METABOL) TLB-BNP (B-Natriuretic Peptide) (83880-BNPR) Echocardiogram (Echo)  Problem # 3:  DYSPNEA (ICD-786.05)  Suspect all related to COPD. See #1. Will get a BNP to r/o CHF. Will get an echo to assess for Diastolic dysfxn and to check pulmonary pressures.  Orders: EKG w/ Interpretation (93000) TLB-BMP (Basic Metabolic Panel-BMET) (80048-METABOL) TLB-BNP (B-Natriuretic Peptide) (83880-BNPR) Echocardiogram (Echo)  Problem # 4:  HYPERLIPIDEMIA (ICD-272.4) LDL 99 in Oct. 2011  His updated medication list for this problem includes:    Crestor 10 Mg Tabs (Rosuvastatin calcium) .Marland Kitchen... Take 1 tablet by mouth once a day  Problem # 5:   HYPERTENSION (ICD-401.9)  Slightly elevated. Will hold off on any adjustments for now.  Problem # 6:  TOBACCO USER (ICD-305.1) Discussed need for cessation. He is currently not willing to quit.  Patient Instructions: 1)  Labwork today: bmet/bnp (786.05;414.01). 2)  Your physician has requested that you have an echocardiogram.  Echocardiography is a painless test that uses sound waves to create images of your heart. It provides your doctor with information about the size and shape of your heart and how well your heart's chambers and valves are working.  This procedure takes approximately one hour. There are no restrictions for this procedure. 3)  We will call to get you an appointment to followup with Dr. Shelle Iron. 4)  Your physician recommends that you schedule a follow-up appointment in: 2 weeks with Dr. Daleen Squibb or Lilian Coma.

## 2010-10-19 NOTE — Letter (Signed)
Summary: [facility]  [facility]   Imported By: Marylou Mccoy 09/20/2010 10:11:16  _____________________________________________________________________  External Attachment:    Type:   Image     Comment:   External Document

## 2010-10-19 NOTE — Progress Notes (Signed)
Summary: Need clearance to have surgery on shoulder.  Phone Note Call from Patient Call back at Home Phone [phone]   Caller: Patient Summary of Call: Pt having surgery on his shoulder and meed clearance it can be faxed to Dr.Supple  at [facility]. [phone]  pt was last seen in 10/20/09 Initial call taken by: Judie Grieve,  December 28, 2009 3:09 PM  Follow-up for Phone Call        Cleared for surgery. Just saw in office 2/11 and not having any angina or instability. Follow-up by: Gaylord Shih, MD, [facility],  December 29, 2009 1:39 PM     Appended Document: Need clearance to have surgery on shoulder. Dr. Vern Claude surgical clearance faxed to Dr. Kirtland Bouchard. Supple MD. Pt. and wife aware.

## 2010-10-19 NOTE — Progress Notes (Signed)
  Faxed Stress over to Jean/Pre-Admission [phone] [facility]  Jan 23, 2010 10:31 AM

## 2010-10-19 NOTE — Progress Notes (Signed)
Summary: chest discomfort  Phone Note Call from Patient Call back at Home Phone [phone] Call back at [phone]   Caller: Patient Reason for Call: Talk to Nurse Complaint: Chest Pain Details of Complaint: discomfort.  Initial call taken by: Lorne Skeens,  July 04, 2010 10:37 AM  Follow-up for Phone Call        Justin Trujillo calls today b/c he has had "a little bit of left carotid tightness and almost a numbness in my left arm from time to time".  He describes on occasion a  chest tightness,numbness, or heavy feeling.  He says this is not exertional.  It usually happens in the morning and it has been going on for about 1 week.  He said that it also occured yesterday late afternoon.  He does smoke 3/4 ppd. He is having no discomfort at this time.  He is aware to call 911 if this reoccurs.  He plans to follow-up today with his pcp. I will forward this to Dr. Daleen Squibb. Mylo Red RN

## 2010-10-19 NOTE — Assessment & Plan Note (Signed)
Summary: Cardiology Nuclear Testing  Nuclear Med Background Indications for Stress Test: Evaluation for Ischemia   History: COPD, Emphysema, Heart Catheterization, Myocardial Perfusion Study  History Comments: 4/07 Cath: N/O CAD, EF NL. 7/09 MPS: Low risk, EF=57%.  Symptoms: Chest Pressure, DOE, Palpitations, SOB  Symptoms Comments: Radiates to neck.   Nuclear Pre-Procedure Cardiac Risk Factors: Family History - CAD, Hypertension, Lipids, NIDDM, Smoker Caffeine/Decaff Intake: none NPO After: 7:30 PM Lungs: clear IV 0.9% NS with Angio Cath: 20g     IV Site: R Antecubital IV Started by: Milana Na, EMT-P Chest Size (in) 56     Height (in): 67 Weight (lb): 256 BMI: 40.24  Nuclear Med Study 1 or 2 day study:  1 day     Stress Test Type:  Treadmill/Lexiscan Reading MD:  Kristeen Miss, MD     Referring MD:  T.Wall Resting Radionuclide:  Technetium 39m Tetrofosmin     Resting Radionuclide Dose:  11.0 mCi  Stress Radionuclide:  Technetium 48m Tetrofosmin     Stress Radionuclide Dose:  33.0 mCi   Stress Protocol  Max Systolic BP: 145 mm Hg Lexiscan: 0.4 mg   Stress Test Technologist:  Milana Na, EMT-P     Nuclear Technologist:  Domenic Polite, CNMT  Rest Procedure  Myocardial perfusion imaging was performed at rest 45 minutes following the intravenous administration of Technetium 4m Tetrofosmin.  Stress Procedure  The patient received IV Lexiscan 0.4 mg over 15-seconds with concurrent low level exercise and then Technetium 75m Tetrofosmin was injected at 30-seconds while the patient continued walking one more minute.  There was sob and abdominal pain but no significant changes with Lexiscan.  Quantitative spect images were obtained after a 45 minute delay.  QPS Raw Data Images:  Normal; no motion artifact; normal heart/lung ratio. Stress Images:  Normal homogeneous uptake in all areas of the myocardium. Rest Images:  Normal homogeneous uptake in all areas of the  myocardium. Subtraction (SDS):  No evidence of ischemia. Transient Ischemic Dilatation:  1.11  (Normal <1.22)  Lung/Heart Ratio:  .35  (Normal <0.45)  Quantitative Gated Spect Images QGS EDV:  114 ml QGS ESV:  54 ml QGS EF:  53 % QGS cine images:  The LV function is normal.  Findings Normal nuclear study      Overall Impression  Exercise Capacity: Lexiscan with low level exercise. BP Response: Normal blood pressure response. Clinical Symptoms: No chest pain ECG Impression: No significant ST segment change suggestive of ischemia. Overall Impression: Normal stress nuclear study. Overall Impression Comments: There is no evidence of ischemia.  The LV systolic function is normal.  Appended Document: Cardiology Nuclear Testing excellent, no change in treatment.  Appended Document: Cardiology Nuclear Testing [facility] DebbieGray RN  Appended Document: Cardiology Nuclear Testing Pt aware of results. Mylo Red RN

## 2010-10-19 NOTE — Progress Notes (Signed)
Summary: pt rtn call  Phone Note Call from Patient Call back at [phone]   Caller: Patient Reason for Call: Talk to Nurse, Talk to Doctor Summary of Call: rtn a call from [facility] Initial call taken by: Omer Jack,  September 06, 2010 8:17 AM  Follow-up for Phone Call        Pt aware of recommendations and will return for Echo 12/28 Mylo Red RN

## 2010-10-19 NOTE — Assessment & Plan Note (Signed)
Summary: NP follow up - COPD   Copy to:  Nadyne Coombes Primary Provider/Referring Provider:  Nadyne Coombes  CC:  3 week follow up for COPD - still having weak/raspy voice, wheezing, DOE, and PND that causes prod cough with yellow mucus.  states saw PCP yesterday and was told is mostly sinus related.Marland Kitchen  History of Present Illness: 65 yo with known hx of COPD   09/2009--The pt comes in today for review of his full pfts.  At the last visit, his bronchodilator regimen was intensifed, he was treated with a course of prednisone, and was urged to stop smoking.  He has cut back since the last visit, but not quit.  His pfts show no obstruction by FEV1%, but he did have mild airtrapping on his lung volumes.  There was no restriction, and his DLCO was normal.  I have gone over the study with him in detail, and answered all of his questions.  September 05, 2010--Presents for an acute office visit. Complains of cough x 6 weeks. Has  went to UC x3 w/ URI symptoms: laryngitis, prod cough with yellow/white mucus, wheezing, DOE, tightness in chest x4-6weeks - was given zpak and pred taper from UC. Reports he had 2 xray with no PNA or acute findings. No records are available.  Has couple of days left of steroids and zpack.  Spiriva was stopped in 09/2009, however pt restarted on own 1 month ago.  Has decreased smoking 3/4 ppd (prev. has been as much as 3 ppd).    September 26, 2010 --Presents for 3 week follow up for COPD . Last ov with slow to resolve bronchitis-he was tx for cyclical cough and ACE was stopped. He returns today improved. He is  still having weak/raspy voice. He is feeling much better, cough is better and dyspnea is resolved.  Unfortunately he is still smoking. He is tolerating Diovan. He does have alot of sinus drainage and trhoat clearing.  Denies chest pain, dyspnea, orthopnea, hemoptysis, fever, n/v/d, edema, headache.   Medications Prior to Update: 1)  Hydrochlorothiazide 25 Mg Tabs  (Hydrochlorothiazide) .... Take 1 Tablet By Mouth Once A Day 2)  Crestor 40 Mg Tabs (Rosuvastatin Calcium) .... Take 1 Tablet At Bedtime 3)  Diovan 80 Mg Tabs (Valsartan) .Marland Kitchen.. 1 By Mouth Once Daily 4)  Actoplus Met 15-850 Mg Tabs (Pioglitazone Hcl-Metformin Hcl) .... Take 1 Tablet By Mouth Two Times A Day 5)  Albuterol Sulfate (2.5 Mg/10ml) 0.083% Nebu (Albuterol Sulfate) .Marland Kitchen.. 1 Vial in Nebulizer As Needed 6)  Vicodin Es 7.5-750 Mg Tabs (Hydrocodone-Acetaminophen) .... As Needed 7)  Percocet 10-325 Mg Tabs (Oxycodone-Acetaminophen) .... As Needed 8)  Mobic 15 Mg Tabs (Meloxicam) .... As Needed 9)  Flexeril 10 Mg Tabs (Cyclobenzaprine Hcl) .... As Needed 10)  Combivent 103-18 Mcg/act Aero (Ipratropium-Albuterol) .... As Needed 11)  Androgel Pump 1.25 Gm/act (1%) Gel (Testosterone) .... As Directed 12)  Symbicort 80-4.5 Mcg/act Aero (Budesonide-Formoterol Fumarate) .... Daily 13)  Spiriva Handihaler 18 Mcg Caps (Tiotropium Bromide Monohydrate) .... Daily 14)  Prednisone (Pak) 10 Mg Tabs (Prednisone) .... Uad 15)  Tessalon 200 Mg Caps (Benzonatate) .Marland Kitchen.. 1 By Mouth Three Times A Day As Needed Cough  Current Medications (verified): 1)  Hydrochlorothiazide 25 Mg Tabs (Hydrochlorothiazide) .... Take 1 Tablet By Mouth Once A Day 2)  Crestor 40 Mg Tabs (Rosuvastatin Calcium) .... Take 1 Tablet At Bedtime 3)  Diovan 80 Mg Tabs (Valsartan) .Marland Kitchen.. 1 By Mouth Once Daily 4)  Actoplus Met 15-850 Mg Tabs (  Pioglitazone Hcl-Metformin Hcl) .... Take 1 Tablet By Mouth Two Times A Day 5)  Albuterol Sulfate (2.5 Mg/55ml) 0.083% Nebu (Albuterol Sulfate) .Marland Kitchen.. 1 Vial in Nebulizer As Needed 6)  Vicodin Es 7.5-750 Mg Tabs (Hydrocodone-Acetaminophen) .... As Needed 7)  Percocet 10-325 Mg Tabs (Oxycodone-Acetaminophen) .... As Needed 8)  Mobic 15 Mg Tabs (Meloxicam) .... As Needed 9)  Flexeril 10 Mg Tabs (Cyclobenzaprine Hcl) .... As Needed 10)  Combivent 103-18 Mcg/act Aero (Ipratropium-Albuterol) .... As Needed 11)   Symbicort 80-4.5 Mcg/act Aero (Budesonide-Formoterol Fumarate) .... Inhale 2 Puffs Two Times A Day 12)  Tessalon 200 Mg Caps (Benzonatate) .Marland Kitchen.. 1 By Mouth Three Times A Day As Needed Cough 13)  Mucinex Dm 30-600 Mg Xr12h-Tab (Dextromethorphan-Guaifenesin) .... Take 1-2 Tablets Every 12 Hours As Needed 14)  Zyrtec Allergy 10 Mg Tabs (Cetirizine Hcl) .... Take 1 Tab By Mouth At Bedtime As Needed Drainage 15)  Prilosec Otc 20 Mg Tbec (Omeprazole Magnesium) .... Take 1 Tablet By Mouth Once A Day Before First Meal of The Day  Allergies (verified): 1)  ! * Ivp Dye 2)  ! * Avelox 3)  ! * Latex 4)  ! Adhesive Tape  Past History:  Past Medical History: Last updated: 09/15/2010 CAD, NATIVE VESSEL (ICD-414.01) EMPHYSEMA (ICD-492.8) DYSPNEA (ICD-786.05) CHRONIC OBSTRUCTIVE PULMONARY DISEASE, ACUTE EXACERBATION (ICD-491.21) OBSTRUCTIVE SLEEP APNEA (ICD-327.23) DM (ICD-250.00) HYPERLIPIDEMIA (ICD-272.4) HYPERTENSION (ICD-401.9) Echo 08/2010: EF 55-60%; Grade 1 Diast. Dysfxn; mild BAE  Past Surgical History: Last updated: 09/29/2009 L lens implant vascectomy R knee replacement x 2 tonsillectomy and adenoids  Family History: Last updated: 08/31/2009 emphysema: father, mother heart disease: father (m.i.), paternal side of family clotting disorders: maternal side of family   Social History: Last updated: 09/21/2009 Patient is a current smoker.  started 1966.  prev smoked 3 ppd.  currently at 1/4 ppd or less. pt is married. pt works as a Engineer, maintenance (IT).  Risk Factors: Smoking Status: current (08/31/2009)  Review of Systems      See HPI  Vital Signs:  Patient profile:   65 year old male Height:      67 inches Weight:      265.38 pounds BMI:     41.71 O2 Sat:      97 % on Room air Temp:     97 degrees F oral Pulse rate:   80 / minute BP sitting:   134 / 82  (left arm) Cuff size:   large  Vitals Entered By: Boone Master CNA/MA (September 26, 2010 9:25  AM)  O2 Flow:  Room air CC: 3 week follow up for COPD - still having weak/raspy voice, wheezing, DOE, PND that causes prod cough with yellow mucus.  states saw PCP yesterday and was told is mostly sinus related. Is Patient Diabetic? No Comments Medications reviewed with patient Daytime contact number verified with patient. Boone Master CNA/MA  September 26, 2010 9:25 AM    Physical Exam  Additional Exam:  GEN: A/Ox3; pleasant , NAD HEENT:  [place]/AT, , EACs-clear, TMs-wnl, NOSE-clear, THROAT-clear NECK:  Supple w/ fair ROM; no JVD; normal carotid impulses w/o bruits; no thyromegaly or nodules palpated; no lymphadenopathy. RESP  CTA no wheezing (psuedowheezing resolved) CARD:  RRR, no m/r/g   GI:   Soft & nt; nml bowel sounds; no organomegaly or masses detected. [place]: Warm bil,  no calf tenderness edema, clubbing, pulses intact Neuro:intact w/ no focal deficits    Impression & Recommendations:  Problem # 1:  CHRONIC OBSTRUCTIVE PULMONARY DISEASE,  ACUTE EXACERBATION (ICD-491.21)  Slow to resolve flare suspect cough is multifactoral related to ACE inhibitor, GERD, rhinitis, smoking would avoid ace inhibitors in future Plan:  Remain off Lotensin.  Continue on  Diovan 80mg  once daily  Mucinex DM two times a day for cough  Tessalon three times a day as needed cough  Zyrtec 10mg  at bedtime -this is for sinus drainge Prilosec 20mg  once daily before meal-this protects upper airway from reflux. for 3 weeks  Try not to cough, or clear throat. Use sugarless candy, ice chips, water---NO MINTS.  Saline nasal rinse/spray two times a day  Patanase 2 puffs at bedtime  MOST IMPORTANT GOAL IS TO STOP SMOKING.  follow up Dr. Shelle Iron in 3-4 months   Orders: Est. Patient Level III ([address])  Problem # 2:  HYPERTENSION (ICD-401.9) controlled on changes  His updated medication list for this problem includes:    Hydrochlorothiazide 25 Mg Tabs (Hydrochlorothiazide) .Marland Kitchen... Take 1 tablet by mouth once a  day    Diovan 80 Mg Tabs (Valsartan) .Marland Kitchen... 1 by mouth once daily  Medications Added to Medication List This Visit: 1)  Symbicort 80-4.5 Mcg/act Aero (Budesonide-formoterol fumarate) .... Inhale 2 puffs two times a day 2)  Mucinex Dm 30-600 Mg Xr12h-tab (Dextromethorphan-guaifenesin) .... Take 1-2 tablets every 12 hours as needed 3)  Zyrtec Allergy 10 Mg Tabs (Cetirizine hcl) .... Take 1 tab by mouth at bedtime as needed drainage 4)  Prilosec Otc 20 Mg Tbec (Omeprazole magnesium) .... Take 1 tablet by mouth once a day before first meal of the day 5)  Patanase 0.6 % Soln (Olopatadine hcl) .... 2 puffs at bedtime  Patient Instructions: 1)  Remain off Lotensin.  2)  Continue on  Diovan 80mg  once daily  3)  Mucinex DM two times a day for cough  4)  Tessalon three times a day as needed cough  5)  Zyrtec 10mg  at bedtime -this is for sinus drainge 6)  Prilosec 20mg  once daily before meal-this protects upper airway from reflux. for 3 weeks  7)  Try not to cough, or clear throat. Use sugarless candy, ice chips, water---NO MINTS.  8)  Saline nasal rinse/spray two times a day  9)  Patanase 2 puffs at bedtime  10)  MOST IMPORTANT GOAL IS TO STOP SMOKING.  11)  follow up Dr. Shelle Iron in 3-4 months  Prescriptions: PATANASE 0.6 % SOLN (OLOPATADINE HCL) 2 puffs at bedtime  #1 x 5   Entered and Authorized by:   Rubye Oaks NP   Signed by:   Caitlen Worth NP on 09/26/2010   Method used:   Electronically to        [id number] (retail)       [address] [place], [place]  [address]       Ph: [phone]       Fax: [phone]   RxID:   [id number]   Appended Document: NP follow up - COPD ov note faxed to Dr. Nadyne Coombes, pt's PCP via biscom per TP's request.

## 2010-12-05 LAB — URINE MICROSCOPIC-ADD ON

## 2010-12-05 LAB — COMPREHENSIVE METABOLIC PANEL
ALT: 19 U/L (ref 0–53)
AST: 23 U/L (ref 0–37)
Albumin: 4.3 g/dL (ref 3.5–5.2)
Alkaline Phosphatase: 69 U/L (ref 39–117)
BUN: 11 mg/dL (ref 6–23)
CO2: 29 mEq/L (ref 19–32)
Calcium: 10 mg/dL (ref 8.4–10.5)
Chloride: 101 mEq/L (ref 96–112)
Creatinine, Ser: 0.85 mg/dL (ref 0.4–1.5)
GFR calc Af Amer: >60 mL/min (ref >60)
GFR calc non Af Amer: >60 mL/min (ref >60)
Glucose, Bld: 189 mg/dL — ABNORMAL HIGH (ref 70–99)
Potassium: 4.6 mEq/L (ref 3.5–5.1)
Sodium: 139 mEq/L (ref 135–145)
Total Bilirubin: 0.7 mg/dL (ref 0.3–1.2)
Total Protein: 6.7 g/dL (ref 6.0–8.3)

## 2010-12-05 LAB — URINALYSIS, ROUTINE W REFLEX MICROSCOPIC
Bilirubin Urine: NEGATIVE
Glucose, UA: 250 mg/dL — AB
Hgb urine dipstick: NEGATIVE
Ketones, ur: NEGATIVE mg/dL
Leukocytes, UA: NEGATIVE
Nitrite: NEGATIVE
Protein, ur: 100 mg/dL — AB
Specific Gravity, Urine: 1.027 (ref 1.005–1.030)
Urobilinogen, UA: 0.2 mg/dL (ref 0.0–1.0)
pH: 6 (ref 5.0–8.0)

## 2010-12-05 LAB — CBC
HCT: 44.4 % (ref 39.0–52.0)
Hemoglobin: 15.1 g/dL (ref 13.0–17.0)
MCHC: 34 g/dL (ref 30.0–36.0)
MCV: 87.4 fL (ref 78.0–100.0)
Platelets: 165 10*3/uL (ref 150–400)
RBC: 5.08 MIL/uL (ref 4.22–5.81)
RDW: 13.5 % (ref 11.5–15.5)
WBC: 8 10*3/uL (ref 4.0–10.5)

## 2010-12-05 LAB — PROTIME-INR
INR: 0.95 (ref 0.00–1.49)
Prothrombin Time: 12.6 seconds (ref 11.6–15.2)

## 2010-12-05 LAB — GLUCOSE, CAPILLARY
Glucose-Capillary: 184 mg/dL — ABNORMAL HIGH (ref 70–99)
Glucose-Capillary: 228 mg/dL — ABNORMAL HIGH (ref 70–99)
Glucose-Capillary: 238 mg/dL — ABNORMAL HIGH (ref 70–99)
Glucose-Capillary: 250 mg/dL — ABNORMAL HIGH (ref 70–99)

## 2010-12-05 LAB — APTT: aPTT: 24 seconds (ref 24–37)

## 2011-01-30 NOTE — Assessment & Plan Note (Signed)
[facility]                            CARDIOLOGY OFFICE NOTE   NAME:Flemmer, JARRIN STALEY                       MRN:          [id number]  DATE:03/31/2008                            DOB:          1946/08/29    Mr. Spease comes in today.  He has been having increased dyspnea on  exertion and some chest tightness over his left chest.  He says that the  chest tightness does not get worse with exertion, but is sort of  spontaneous.   He has nonobstructive coronary artery disease by cath in 2007.  He is a  high-risk individual this he and I have discussed because of his ongoing  risk factors with hypertension not well controlled, diabetes, tobacco  use, and hyperlipidemia.  He does take his medications.   MEDICATIONS:  He is currently on:  1. Enteric-coated aspirin 325 mg a day.  2. Crestor 10 mg a day.  3. Lotensin HCTZ 12.5/20 daily.  4. Avandamet 2/500 b.i.d.  5. He carries sublingual nitroglycerin.   PHYSICAL EXAMINATION:  His blood pressure today was elevated at 167/102,  his pulse is 91 and regular.  His weight is 255.  He is in his usual  jovial mood.  Alert and oriented.  Skin is warm and dry.  HEENT  unchanged.  Carotid upstrokes were equal bilaterally without bruits.  No  JVD.  Thyroid is not enlarged.  Trachea is midline.  Lungs are clear.  Heart reveals a nondisplaced PMI.  Normal S1 and S2.  Abdominal exam is  protuberant, good bowel sounds.  Organomegaly could not be assessed.  Extremities reveal no significant edema.  Pulses are intact.  Neuro exam  is intact.   Mr. Salceda symptoms are worrisome for progressive coronary artery  disease.  Again, he is a high-risk individual with his ongoing risk  factor particularly tobacco use.   RECOMMENDATIONS:  1. Adenosine stress Myoview to rule out obstructive coronary artery      disease.  2. I have asked him to talk to Dr. Artis Flock about switching his Avandamet      to Actos and metformin.  There has  been associated risk in      literature of increased myocardial infarction with Avandia.   Assuming that his stress test is stable, I will see him back in 6  months.  He knows the symptoms of an acute coronary syndrome and how to  react urgently.    Thomas C. Daleen Squibb, MD, [facility]  Electronically Signed   TCW/MedQ  DD: 03/31/2008  DT: 04/01/2008  Job #: [id number]   cc:   Quita Skye. Artis Flock, M.D.

## 2011-01-30 NOTE — Assessment & Plan Note (Signed)
[facility]                            CARDIOLOGY OFFICE NOTE   NAME:Justin Trujillo, Justin Trujillo                       MRN:          [id number]  DATE:02/24/2008                            DOB:          Dec 09, 1945    Justin Trujillo returns today for further management of the following issues:  1. Nonobstructive coronary disease.  He is having no angina or      ischemic symptoms.  2. Hyperlipidemia, followed by Dr. Artis Flock.  3. Type 2 diabetes.  4. Hypertension.  5. Sedentary lifestyle.  6. Obesity.   He continues to take very poor care of himself.  I think he is still  smoking.  He is still quite humorous and is extremely bright.  We had a  very lively conversation today.  It is always a delight to see him in  the office.   He denies any orthopnea, PND, peripheral edema, angina, or ischemic  equivalents.  He has had no palpitations, presyncope, or syncope.   CURRENT MEDICATIONS:  1. Enteric-coated aspirin 325 mg a day.  2. Avandia 4 mg a day.  3. Crestor 10 mg a day.  4. Lotensin/hydrochlorothiazide 12.5/20 daily.  5. Metformin 500 b.i.d.   PHYSICAL EXAMINATION:  VITAL SIGNS:  His blood pressure is 160/90 and  his pulse is 85 and regular.  HEENT:  He wears glasses.  Normocephalic and atraumatic.  PERRLA.  Extraocular movements are intact.  Sclerae are slightly injected.  Facial symmetry is normal.  Dentition is satisfactory.  NECK:  Supple.  Carotid upstrokes are equal without bilateral bruits.  No JVD.  Thyroid is not enlarged.  Trachea is midline.  LUNGS:  Clear.  HEART:  Soft S1 and S2.  PMI is poorly appreciated.  ABDOMEN:  Protuberant.  Good bowel sounds.  No midline bruit.  EXTREMITIES:  No cyanosis, clubbing, or edema.  Pulses are intact.  NEURO:  Intact.  SKIN:  Unremarkable.   His EKG shows sinus rhythm with a rightward axis.  No changes.   Justin Trujillo is stable from our standpoint.  I have encouraged him to  practice better secondary risk factor  modification.  I do think he takes  his meds, however.  I will plan on seeing him back in a year.     Thomas C. Daleen Squibb, MD, [facility]  Electronically Signed    TCW/MedQ  DD: 02/24/2008  DT: 02/25/2008  Job #: [id number]   cc:   Quita Skye. Artis Flock, M.D.

## 2011-02-02 NOTE — H&P (Signed)
NAME:  Justin Trujillo, Justin Trujillo NO.:  [id number]   MEDICAL RECORD NO.:  [id number]          PATIENT TYPE:  EMS   LOCATION:  MAJO                         FACILITY:  [facility]   PHYSICIAN:  Jonelle Sidle, M.D. LHCDATE OF BIRTH:  Mar 07, 1946   DATE OF ADMISSION:  12/17/2005  DATE OF DISCHARGE:                                HISTORY & PHYSICAL   PRIMARY CARE PHYSICIAN:  Dr. Margrett Rud   PRIMARY CARDIOLOGIST:  Dr. Valera Castle   PATIENT PROFILE:  A 65 year old married white male with prior history of non-  obstructive CAD who presents with approximately three-day history of  constant chest pain.   PROBLEM LIST:  1.  Chest pain.  2.  Non-obstructive coronary artery disease.      1.  July 06, 2002:  Cardiac catheterization, ejection fraction 57%,          left main 20%, left anterior descending 50%, mid D1 small vessel          with 80% ostial and mid disease, ramus intermedius normal, left          circumflex 30% proximal, 60-70% distal, right coronary artery          greater than 4 mm vessel which was normal.  3.  Hypertension.  4.  Hyperlipidemia.  5.  Tobacco abuse.  6.  Obesity.  7.  Borderline diabetes mellitus.  8.  Chronic obstructive pulmonary disease.  9.  Osteoarthritis status post right TKA in 2001.  10. History of right periprosthetic tib-fib fracture secondary to a fall in      January 2005.  11. History of headache.  12. Abscessed tooth.   HISTORY OF PRESENT ILLNESS:  65 year old married white male with history non-  obstructive CAD and risk factors including hypertension, hyperlipidemia,  diabetes, obesity, and tobacco abuse.  Over the past year he has noted  progressive decrease in exercise tolerance with dyspnea on exertion (100  yards walking on the flat surface or any inclines) and fatigue.  Since  Friday, March 30 he has had constant mid sternal 1-2/10 chest tightness with  greater than 20 episodes per day of 4-5/10 rest and exertional mid  sternal  stabbing/shooting pain without associated symptoms lasting less than five  seconds and resolving spontaneously.  These episodes prompted him to present  to the ED today where he has had at least 12 episodes since arriving.  Cardiac markers are currently pending.  ECG is without acute changes.   ALLERGIES:  IV CONTRAST and AMOXICILLIN.   HOME MEDICATIONS:  1.  Aspirin 325 mg daily.  2.  Crestor 10 mg q.h.s.  3.  Amoxicillin 875 mg b.i.d. (patient thinks he had an allergic reaction to      this with tightness in his throat and nausea and so stopped it early).   FAMILY HISTORY:  Mother is age 82, currently alive and well.  She has  suffered a stroke in her lifetime.  Father died of an MI at 5.  He has a  brother who died of homicide and sister who is alive and well.  SOCIAL HISTORY:  Lives in [place] with his wife.  He works as an Theatre stage manager.  He is married with three grown children.  He  has an 80-pack-year history of tobacco abuse smoking on average of two packs  a day over the past 40 years, currently smoking about three-quarters of a  pack a day.  He rarely has an alcoholic beverage and has not used any drugs  and does not routinely exercise.   REVIEW OF SYSTEMS:  Positive for fevers March 29 at which time he was  diagnosed with a tooth abscess and initiated on amoxicillin as above.  He  has had right tooth pain.  Positive for chest pain, shortness of breath,  dyspnea on exertion, mild lower extremity edema, nausea.  All other systems  reviewed and negative.   PHYSICAL EXAMINATION:  VITAL SIGNS:  Temperature 98.1, heart rate 79,  respirations 22, blood pressure 155/98, pulse ox 94% on room air.  GENERAL:  Pleasant white male in no acute distress.  Awake, alert, and  oriented x3.  NECK:  Obese with no bruits.  LUNGS:  Respirations regular and unlabored, clear to auscultation.  CARDIAC:  Regular S1, S2.  No S3, S4 with a 2/6 systolic ejection  murmur at  the right upper sternal border.  ABDOMEN:  Round, soft, nontender, nondistended.  Bowel sounds present x4.  EXTREMITIES:  Warm, dry, pink.  No clubbing, cyanosis, edema.  Dorsalis  pedis, posterior tibial pulses 2+ and equal bilaterally.  No femoral bruits  are noted.   Chest x-ray is pending.  EKG shows sinus rhythm with a rate of 78, normal  axis, and no acute ST-T changes.   LABORATORIES:  Hemoglobin 16, hematocrit 47.  Sodium 134, potassium 3.7,  chloride 104, CO2 26, BUN 12, creatinine 27, glucose 143.   ASSESSMENT/PLAN:  1.  Chest pain/dyspnea on exertion.  Chest pain is fairly atypical with a      constant component for the past three days as well as a sharp-shooting      component occurring more than 20 times per day.  Will continue to cycle      cardiac markers.  More concerning is his progressive dyspnea on exertion      and fatigue in setting of multiple risk factors.  Discussed this case      with Dr. Diona Browner.  Plan is to admit for cardiac catheterization.      Because of his prior history of questionable dye allergy will pre treat      with intravenous Benadryl, prednisone, and Pepcid.  Will continue      aspirin, Statin, and add low-dose beta blocker as well as heparin.  Will      have to watch for wheezing.  2.  Hypertension.  Patient says this has been borderline in the past and      never been on antihypertensives.  His pressure today is 155/98.  We will      add low-dose beta blocker.  If he does not have significant coronary      artery disease and does have normal left ventricular function, likely      could discontinue beta blocker in favor of an ACE inhibitor for      treatment of hypertension in this diabetic gentleman.  3.  Hyperlipidemia.  Will check lipids and LFTs.  Continue Crestor.  4.  Type 2 diabetes mellitus.  Per patient this is borderline, does not take  anything for it.  His non-fasting sugar is 143.  Will check CBGs and add       sliding scale insulin.  Also check hemoglobin A1c.  5.  Tobacco abuse.  Smoking cessation strongly advised.  We will obtain a      smoking cessation consult.  6.  Tooth abscess.  Questionable allergy to amoxicillin.  Will change this      to clindamycin.  Will follow up white      count.  He is currently afebrile.  7.  Chronic obstructive pulmonary disease.  He is not currently wheezing.      Will have to watch this closely as we initiate the low-dose beta      blocker.      Ok Anis, NP    ______________________________  Jonelle Sidle, M.D. [facility]    CRB/MEDQ  D:  12/17/2005  T:  12/18/2005  Job:  [id number]

## 2011-02-02 NOTE — Consult Note (Signed)
NAME:  Justin Trujillo, Justin Trujillo                          ACCOUNT NO.:  [id number]   MEDICAL RECORD NO.:  [id number]                   PATIENT TYPE:  INP   LOCATION:  0455                                 FACILITY:  [facility]   PHYSICIAN:  Luis A. Delaney Meigs, M.D.                DATE OF BIRTH:  08/04/1946   DATE OF CONSULTATION:  10/03/2003  DATE OF DISCHARGE:                                   CONSULTATION   REASON FOR CONSULTATION:  Management of medical issues.   CLINICAL HISTORY:  This is a pleasant 65 year old white male with a past  medical history of hypertension, diabetes mellitus, chronic pulmonary  obstructive disease, nonobstructive coronary artery disease, who is admitted  for fracture of right tibia and fibula.  The patient unfortunately does not  check blood sugars regularly and continues to smoke two packs of cigarettes  per day.  He denies any polyuria, polydipsia, chest pain, shortness of  breath or any other symptoms.   CURRENT MEDICATIONS:  He states is some type of anti-diabetes medication but  does not know the name of the dose.  He is also uncertain about his blood  pressure medications as far as names and doses.   ALLERGIES:  No known drug allergies.   SOCIAL HISTORY:  Married.  Smokes a pack of cigarettes a day.   PHYSICAL EXAMINATION:  GENERAL:  A moderately obese white male in moderate  distress.  VITAL SIGNS:  Blood pressure 170/102, heart rate of 84, respiratory rate of  18, temperature 97.  HEENT:  Pupils are equal and reactive to light.  Extraocular movements are  intact.  Oropharynx examination is unremarkable.  NECK:  Without bruits or JVD.  CARDIOVASCULAR:  Exam reveals S1, S2 without any murmurs, rubs, gallops or  clicks.  Heart sounds appear distant.  LUNGS:  There is bilateral rhonchi.  ABDOMEN:  Obese.  EXTREMITIES:  Are without clubbing, cyanosis or edema.   RECOMMENDATIONS:  1. Hypertension.  This patient has known coronary disease and diabetes.     Will  start on beta block, Metoprolol 25 mg p.o. b.i.d. and ACE inhibitor,     Enalapril 2.5 mg p.o. daily and titrate up as necessary.  2. Hypertension as described above.  3. Diabetes.  Will check a hemoglobin A1C, a fasting lipid panel, liver     function tests, urinalysis and     microalbumin.  Will start Metformin 500 mg p.o. b.i.d. and check blood     glucose q.6h.  4. Chronic pulmonary obstructive disease.  Albuterol and Atrovent     nebulizations p.r.n.   Thank you for the consult on this pleasant gentleman.  I will continue to  follow with you.  Luis A. Delaney Meigs, M.D.    LAT/MEDQ  D:  10/03/2003  T:  10/04/2003  Job:  [id number]   cc:   Almedia Balls. Ranell Patrick, M.D.  [address]  [place]  [place] [address]  Fax: [phone]

## 2011-02-02 NOTE — Cardiovascular Report (Signed)
NAME:  Justin Trujillo, Justin Trujillo NO.:  [id number]   MEDICAL RECORD NO.:  [id number]          PATIENT TYPE:  INP   LOCATION:  3733                         FACILITY:  [facility]   PHYSICIAN:  Charlies Constable, M.D. [facility] DATE OF BIRTH:  02/18/1946   DATE OF PROCEDURE:  12/18/2005  DATE OF DISCHARGE:                              CARDIAC CATHETERIZATION   CLINICAL HISTORY:  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  He has a documented nonobstructive coronary disease at  catheterization in 2003.  He is a smoker although he has cut back.  Recently, he has had symptoms of increasing dyspnea on exertion and  developed some substernal chest tightness which was persistent.  He is  admitted to the hospital for diagnosis of possible unstable angina.   PROCEDURE:  The procedure was performed via the right femoral artery using  an arterial sheath and 6-French preformed coronary catheters.  A femoral  wall arterial puncture was performed and Omnipaque contrast was used.  Distal aortogram was performed to rule out renal vascular causes for  hypertension.  The right femoral artery was closed with Angio-Seal at the  end of the procedure.  The patient tolerated the procedure well and left the  laboratory in satisfactory condition.   RESULTS:  The left main coronary artery had a 20% ostial lesion.   The left anterior descending artery gave rise to a very large optional  diagonal branch, a smaller second diagonal branch, and a septal perforator.  There was 40% narrowing in the mid LAD after the septal perforator which was  segmental.  There was 40% narrowing near the ostium of the second diagonal  branch.   The circumflex artery gave rise to a small and large posterolateral branch.  There was 20% narrowing in the proximal circumflex artery and 50% narrowing  in the distal circumflex artery.   The right coronary artery was a large dominant vessel that gave rise to a  posterior  descending branch and three  posterolateral branches.  These  vessels were free of significant disease.   The left ventriculogram performed in the RAO projection showed good wall  motion with no areas of hypokinesis.  The estimated ejection fraction was  60%.   A distal aortogram was performed which showed patent renal arteries and no  significant aortoiliac obstruction.   HEMODYNAMIC DATA:  The left ventricular pressure was 177/32 and the aortic  pressure was 177/98 with a mean of 132.   CONCLUSIONS:  Nonobstructive coronary artery disease with 20% narrowing of  the ostium of the left main coronary artery, 40% narrowing in the mid left  anterior descending artery with 40% narrowing of the first diagonal branch,  20% narrowing in the proximal circumflex artery and 50% narrowing in the  distal circumflex artery, no significant disease in the right coronary  artery, and normal left ventricular function.   RECOMMENDATIONS:  The patient has only mild nonobstructive coronary artery  disease which has not progressed from the prior study and in some areas  looks a little better.  We  will plan on continuing secondary risk factor  modification and reassurance.           ______________________________  Charlies Constable, M.D. [facility]     BB/MEDQ  D:  12/18/2005  T:  12/19/2005  Job:  [id number]   cc:   Vale Haven. Andrey Campanile, M.D.  Fax: [phone]   Jesse Sans. Wall, M.D.  [address]  [address]  [place]  [place] [address]   Cardiopulmonary Lab

## 2011-02-02 NOTE — Op Note (Signed)
NAME:  Justin, Trujillo                          ACCOUNT NO.:  [id number]   MEDICAL RECORD NO.:  [id number]                   PATIENT TYPE:  INP   LOCATION:  0467                                 FACILITY:  [facility]   PHYSICIAN:  Ollen Gross, M.D.                 DATE OF BIRTH:  Jan 12, 1946   DATE OF PROCEDURE:  12/29/2003  DATE OF DISCHARGE:                                 OPERATIVE REPORT   PREOPERATIVE DIAGNOSES:  1. Loose unstable right total knee arthroplasty.  2. Right shoulder impingement syndrome.   POSTOPERATIVE DIAGNOSES:  1. Loose unstable right total knee arthroplasty.  2. Right shoulder impingement syndrome.   PROCEDURE:  1. Right total knee arthroplasty revision.  2. Cortizone injection, right shoulder.   SURGEON:  Ollen Gross, M.D.   ASSISTANT:  Alexzandrew L. Julien Girt, P.A.   ANESTHESIA:  General.   ESTIMATED BLOOD LOSS:  Minimal.   DRAINS:  Hemovac x1.   TOURNIQUET TIME:  48 minutes at 300 mmHg and then down 12 minutes and up an  additional 22 minutes at 300 mmHg.   COMPLICATIONS:  None.   CONDITION:  Stable to recovery.   BRIEF CLINICAL NOTE:  Justin Trujillo is a 65 year old male who had a right total  knee arthroplasty performed in 2001.  He presented to me in the fall of 2004  with a loose unstable right total knee.  He was scheduled for total knee  arthroplasty revision in February but sustained a periprosthetic tibial  fracture a few weeks prior to then. We have allowed the periprosthetic  tibial fracture to heal and he subsequently presents now for total knee  arthroplasty revision.   DESCRIPTION OF PROCEDURE:  After successful administration of general  anesthetic, a tourniquet was placed high on the right thigh and right lower  extremity prepped and draped in the usual sterile fashion.  Extremity was  wrapped in esmarch, knee flexed, tourniquet inflated to 300 mmHg.  A  standard midline incision was made with a 10 blade through the subcutaneous  tissue to the level of the extensor mechanism. A fresh blade was used to  make a medial parapatellar arthrotomy. Upon entering the joint, there was  minimal fluid but there was a tremendous amount of hypertrophic synovial  tissue stained with apparent metal debris.  The synovium is excised in the  medial and lateral gutters and then the soft tissue over the proximal and  medial tibia subperiosteally elevated to the joint line with a knife and  into the semimembranosus bursa with a Cobb elevator. I was able to evert the  patella and I flexed the knee 90 degrees and removed the synovium in the  lateral gutter. Once we had thoroughly cleared the hypertrophic synovial  tissue, I subsequently used osteotomes to disrupt the interface between the  femoral prosthesis and bone and removed the femoral prosthesis with minimal  bone loss.  We then  subluxed the tibia forward and removed the LCS 17.5 mm  tibial polyethylene.  The tibial component was grossly loose and easily  removed.  The cement is then removed from the tibial canal to allow for  access to the __________ canal.  The reaming is then performed up to 14 mm  on the tibia and 18 mm on the femur to allow for a 14 mm x 115 Press-Fit  stem on the tibia and 18 x 125 Press-Fit stem on the femur.   The intramedullary alignment guide is placed into the tibia and the 3 degree  cutting block is placed. We pinned the block to remove about a millimeter  below the cement bone margin.  Once this is resected, we did have healthy  bone. There is a cyst present anteriorly which is curetted and all the  cystic contents removed.  There is also a small one posterolaterally and  this is removed. We placed the 14 x 115 stem extension with a size 3 tibial  component which was offset laterally such that the stem is medial and the  component lateral so it would fit centrally on the tibia.  We then placed  the 18 mm stem into the femoral canal and placed the distal  cutting block to  skin bone off of the distal femur.  It is in a 5 degree right valgus  alignment position.  We need to go up to 4 mm on each side and thus placed 4  mm augments distally, medially and laterally.  I placed a T cutting block  for the size 3.  With the spacer block in place, we marked the rotation  which corresponded with the epicondylar axis. The anterior and posterior  cuts are made in the plus 2 position to effectively raise the stem and lower  the components so they would rest on the anterior tibial cortex.  Posteromedially, we had to use a 4 mm augment to get to bone. We  intercondylar and chamfer cuts were then completed for the size 3. A trial  was placed on the femoral side, 18 x 125 stem, size 3 femur in the plus 2  position with a 5 degree right valgus and with 4 mm distal augments medially  and laterally and a 4 mm posteromedial augment.  Given that he had such a  large flexion and extension gap preoperatively, I decided to place 10 mm  medial and lateral augments under the tibial tray. We did that with the 14 x  115 stem.  The trials were placed and with a 10 mm size 3 TC3 fixed bearing  insert full extension is achieved with a slight bit of varus valgus  imbalance and I placed a 12.5 which had full extension with excellent varus  valgus balance throughout full range of motion.  At this time, I released  the tourniquet with a total time of 48 minutes. While the tourniquet was  down, we removed the patellar component, measured the thickness of the  patella to be 14 mm, did a free hand resection to approximately 12 mm,  placed a 28 template, lug holes were drilled, trial patella was placed, and  it tracks normally.  The composite thickness of the patella was subsequently  22 mm.  Once the tourniquet was down for 12 minutes, we rewrapped the leg in  esmarch and during that time assembled the prostheses on the back table. The sizes and augments are as listed above.   Once  the tourniquet was reinflated  then all the trials were removed and the cut bone surfaces are prepared with  pulsatile lavage. Cement is mixed and once ready for implantation, the  tibial component is cemented into placed proximally with the Press-Fit stem  distally.  It was excellent fit on the tibial component and we matched the  rotation such that the center of the tray would correspond to the medial  aspect of the tibial tubercle.  On the femoral side, we also cemented  distally with the Press-Fit stem.  The trial 12.5 mm insert is placed, knee  held in full extension, all extruded cement removed.  The 38 patella was  also cemented in place and held with a clamp.  Once the cement is fully  hardened then a permanent 12.5 mm TC3 insert is impacted into the tibial  tray. The knee is reduced with excellent stability throughout full range of  motion.  The wound is copiously irrigated with antibiotic solution and  extensor mechanism closed over a Hemovac drain with interrupted #1 PDS.  Tourniquet was released with a second time of 22 minutes.  Flexion against  gravity is about 130 degrees. The subcu is closed with interrupted 2-0  Vicryl, skin with staples. The incision is cleaned and dried, drain hooked  to suction and a bulky sterile dressing  applied.  I then sterilely prepped his right shoulder and did a subacromial  injection with lidocaine and Depo-Medrol, 4 mL of lidocaine and 40 mg of  Depo-Medrol.  The patient was subsequently awakened and transported to  recovery in stable condition.                                               Ollen Gross, M.D.    FA/MEDQ  D:  12/29/2003  T:  12/29/2003  Job:  [id number]

## 2011-02-02 NOTE — Procedures (Signed)
[place]  Patient:    Justin Trujillo, Justin Trujillo                         MRN: [id number] Proc. Date: 08/26/00 Adm. Date:  [id number] Attending:  Carolan Shiver Ii CC:         Carlisle Beers. Dorothyann Gibbs, M.D.   Procedure Report  PROCEDURE PERFORMED:  Intraoperative transesophageal echocardiogram.  ANESTHESIOLOGIST:  Edwin Cap. Zoila Shutter, M.D.  HISTORY:  Mr. Haggar is a 65 year old white male with a diagnosis of severe djd scheduled for total knee replacement and for whom epidural analgesia in the postoperative period has been requested, explained, understood and accepted preoperatively.  DESCRIPTION OF PROCEDURE:  At the termination of surgery while still under general anesthesia, Mr. Taney was turned to the left lateral decubitus position.  His back was prepped with Betadine and draped in the usual sterile fashion.  Using midline approach and loss of resistance technique, a peridural tap was accomplished at the L1-L2 interspace with a 17 gauge Tuohy needle. this was followed by the injection of a total of 15 cc of 1/3% lidocaine with 100 mcg of fentanyl.  This was followed in turn by the passage of a peridural catheter 10 cm cephalad which was then checked for patency and affixed to the patients back.  The patient was then returned to the supine position, awakened and brought to the post anesthesia care unit where his peridural catheter will be connected to the infusion pump with a mixture of fentanyl 5 mcg per cc and Marcaine 1/16% at an initial rate of 12 cc per hour to be adjusted as needed.  There were no complications.  He did well and will be followed in the usual fashion. DD:  08/26/00 TD:  08/26/00 Job: [id number]

## 2011-02-02 NOTE — Discharge Summary (Signed)
Justin Trujillo, KROTZER                ACCOUNT NO.:  [id number]   MEDICAL RECORD NO.:  [id number]          PATIENT TYPE:  INP   LOCATION:  3733                         FACILITY:  [facility]   PHYSICIAN:  Barth Kirks, M.D.  DATE OF BIRTH:  October 02, 1945   DATE OF ADMISSION:  12/17/2005  DATE OF DISCHARGE:                                 DISCHARGE SUMMARY   No dictation.      Barth Kirks, M.D.     MB/MEDQ  D:  12/18/2005  T:  12/19/2005  Job:  [id number]

## 2011-03-15 ENCOUNTER — Encounter: Payer: Self-pay | Admitting: Cardiology

## 2011-09-26 ENCOUNTER — Ambulatory Visit (INDEPENDENT_AMBULATORY_CARE_PROVIDER_SITE_OTHER): Payer: PRIVATE HEALTH INSURANCE

## 2011-09-26 DIAGNOSIS — I10 Essential (primary) hypertension: Secondary | ICD-10-CM

## 2011-09-26 DIAGNOSIS — E785 Hyperlipidemia, unspecified: Secondary | ICD-10-CM

## 2011-09-26 DIAGNOSIS — E119 Type 2 diabetes mellitus without complications: Secondary | ICD-10-CM

## 2011-10-02 ENCOUNTER — Encounter: Payer: Self-pay | Admitting: Family Medicine

## 2011-10-08 ENCOUNTER — Encounter (INDEPENDENT_AMBULATORY_CARE_PROVIDER_SITE_OTHER): Payer: PRIVATE HEALTH INSURANCE | Admitting: Family Medicine

## 2011-10-08 ENCOUNTER — Encounter: Payer: Self-pay | Admitting: Family Medicine

## 2011-10-08 DIAGNOSIS — J449 Chronic obstructive pulmonary disease, unspecified: Secondary | ICD-10-CM

## 2011-10-08 DIAGNOSIS — G4700 Insomnia, unspecified: Secondary | ICD-10-CM

## 2011-10-08 DIAGNOSIS — N529 Male erectile dysfunction, unspecified: Secondary | ICD-10-CM

## 2011-10-08 DIAGNOSIS — E109 Type 1 diabetes mellitus without complications: Secondary | ICD-10-CM

## 2011-11-07 ENCOUNTER — Telehealth: Payer: Self-pay

## 2011-11-07 NOTE — Telephone Encounter (Signed)
Dr. Hal Hope, please advise: Pt Was put on low dose of insulin. Pt takes if he loses weight, his glucose goes up. And vice versa. He can't get it leveled out. The more insulin he uses to get his sugar down, his weight increases. He wants to know if he should be referred to the diabetic nutrition center at [facility].

## 2011-11-07 NOTE — Telephone Encounter (Signed)
Pt would like to discuss with Dr Hal Hope to see if she thinks its beneficial to him to be referred to [place] [facility].

## 2011-11-12 ENCOUNTER — Ambulatory Visit (INDEPENDENT_AMBULATORY_CARE_PROVIDER_SITE_OTHER): Payer: MEDICARE | Admitting: Internal Medicine

## 2011-11-12 VITALS — BP 103/69 | HR 90 | Temp 98.5°F | Resp 16 | Ht 65.5 in | Wt 241.0 lb

## 2011-11-12 DIAGNOSIS — J45909 Unspecified asthma, uncomplicated: Secondary | ICD-10-CM

## 2011-11-12 DIAGNOSIS — J329 Chronic sinusitis, unspecified: Secondary | ICD-10-CM

## 2011-11-12 DIAGNOSIS — J9801 Acute bronchospasm: Secondary | ICD-10-CM

## 2011-11-12 MED ORDER — BENZONATATE 200 MG PO CAPS: 200.0000 mg | ORAL_CAPSULE | Freq: Three times a day (TID) | ORAL | Status: DC | PRN

## 2011-11-12 MED ORDER — AZITHROMYCIN 500 MG PO TABS: 500.0000 mg | ORAL_TABLET | Freq: Every day | ORAL | Status: AC

## 2011-11-12 NOTE — Progress Notes (Signed)
  Subjective:    Patient ID: ACEL NATZKE, male    DOB: 10/19/1945, 66 y.o.   MRN: [id number]  HPINext-year-old male well known to [place] who presents with a four-day history of increased cough and nasal congestion. No fever but occasional night sweats. Cough is occasionally productive. Sinus discharge is purulent. His long history of smoking and has COPD with bronchospasm frequently. He has not used his inhalers this week. He is trying to avoid them with a mistaken thought that that will help his diabetes control.    Review of SystemsSee last visit     Objective:   Physical ExamVital signs stable TMs clear Nares congested with purulent discharge/tender maxillary to percussion Throat is clear without anterior cervical nodes The lungs reveal bilateral wheezing on forced expiration but no areas of consolidation.        Assessment & Plan:  Problem #1 sinusitis Problem #2 COPD exacerbation  Zithromax 500 daily for 5 days Tessalon Perles refilled To followup in 5 days if not well 2 followup with Dr. Hal Hope as planned for diabetes

## 2011-11-12 NOTE — Patient Instructions (Signed)
Restart inhalers 

## 2012-01-14 ENCOUNTER — Encounter: Payer: Self-pay | Admitting: Family Medicine

## 2012-01-14 ENCOUNTER — Ambulatory Visit (INDEPENDENT_AMBULATORY_CARE_PROVIDER_SITE_OTHER): Payer: MEDICARE | Admitting: Family Medicine

## 2012-01-14 VITALS — BP 117/72 | HR 42 | Temp 97.7°F | Resp 16 | Ht 66.0 in | Wt 232.0 lb

## 2012-01-14 DIAGNOSIS — M12812 Other specific arthropathies, not elsewhere classified, left shoulder: Secondary | ICD-10-CM

## 2012-01-14 DIAGNOSIS — G4733 Obstructive sleep apnea (adult) (pediatric): Secondary | ICD-10-CM

## 2012-01-14 DIAGNOSIS — E291 Testicular hypofunction: Secondary | ICD-10-CM

## 2012-01-14 DIAGNOSIS — K589 Irritable bowel syndrome without diarrhea: Secondary | ICD-10-CM

## 2012-01-14 DIAGNOSIS — E785 Hyperlipidemia, unspecified: Secondary | ICD-10-CM

## 2012-01-14 DIAGNOSIS — E109 Type 1 diabetes mellitus without complications: Secondary | ICD-10-CM

## 2012-01-14 DIAGNOSIS — J449 Chronic obstructive pulmonary disease, unspecified: Secondary | ICD-10-CM

## 2012-01-14 DIAGNOSIS — Z0000 Encounter for general adult medical examination without abnormal findings: Secondary | ICD-10-CM

## 2012-01-14 DIAGNOSIS — I10 Essential (primary) hypertension: Secondary | ICD-10-CM

## 2012-01-14 DIAGNOSIS — M1990 Unspecified osteoarthritis, unspecified site: Secondary | ICD-10-CM

## 2012-01-14 DIAGNOSIS — E119 Type 2 diabetes mellitus without complications: Secondary | ICD-10-CM

## 2012-01-14 DIAGNOSIS — K219 Gastro-esophageal reflux disease without esophagitis: Secondary | ICD-10-CM

## 2012-01-14 DIAGNOSIS — Z6841 Body Mass Index (BMI) 40.0 and over, adult: Secondary | ICD-10-CM

## 2012-01-14 DIAGNOSIS — Z720 Tobacco use: Secondary | ICD-10-CM

## 2012-01-14 DIAGNOSIS — G4700 Insomnia, unspecified: Secondary | ICD-10-CM

## 2012-01-14 DIAGNOSIS — I2510 Atherosclerotic heart disease of native coronary artery without angina pectoris: Secondary | ICD-10-CM

## 2012-01-14 LAB — COMPREHENSIVE METABOLIC PANEL
ALT: 16 U/L (ref 0–53)
Albumin: 4.8 g/dL (ref 3.5–5.2)
CO2: 28 mEq/L (ref 19–32)
Chloride: 101 mEq/L (ref 96–112)
Potassium: 4.4 mEq/L (ref 3.5–5.3)
Sodium: 139 mEq/L (ref 135–145)
Total Bilirubin: 0.4 mg/dL (ref 0.3–1.2)
Total Protein: 6.7 g/dL (ref 6.0–8.3)

## 2012-01-14 LAB — TSH: TSH: 0.8 u[IU]/mL (ref 0.350–4.500)

## 2012-01-14 LAB — LIPID PANEL
Cholesterol: 129 mg/dL (ref 0–200)
VLDL: 18 mg/dL (ref 0–40)

## 2012-01-14 MED ORDER — HYOSCYAMINE SULFATE ER 0.375 MG PO TB12: 0.3750 mg | ORAL_TABLET | Freq: Two times a day (BID) | ORAL | Status: DC | PRN

## 2012-01-14 NOTE — Progress Notes (Signed)
  Subjective:    Patient ID: Justin Trujillo, male    DOB: Sep 26, 1945, 66 y.o.   MRN: [id number]  HPI  Patient presents to clinic for CPE  IDDM-  Blood sugars are fluctuating Exercising 2-3 times a week " I don't know whats causing my blood sugars to flucuate"  Missed medication last night Dietary indescretions Interested in referral to diabetologist and endocrinologist. 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 Ewing Schlein  Review of Systems   Arthralgias Objective:   Physical Exam  Constitutional: He appears well-developed.  HENT:  Head: Normocephalic and atraumatic.  Right Ear: External ear normal.  Left Ear: External ear normal.  Nose: Nose normal.  Mouth/Throat: Oropharynx is clear and moist.  Eyes: Conjunctivae and EOM are normal. Pupils are equal, round, and reactive to light.  Neck: Normal range of motion. Neck supple. No thyromegaly present.  Cardiovascular: Normal rate, regular rhythm, normal heart sounds and intact distal pulses.   Pulmonary/Chest: Effort normal. No respiratory distress. He has no wheezes.       bronchiolar BS  Abdominal: Soft. Bowel sounds are normal. There is no hepatosplenomegaly.  Genitourinary: Enlarged: declined exam.  Neurological:       Early neuropathic changes (B) LE  Skin: No lesion noted.           HgA1C- 5.9 Assessment & Plan:   1. DM type 2 (diabetes mellitus, type 2)  POCT glycosylated hemoglobin (Hb A1C), Referral to [facility], Ambulatory referral to Endocrinology  2. Routine general medical examination at a health care facility    3. CAD (coronary artery disease)    4. HTN (hypertension)  Comprehensive metabolic panel, TSH  5. Dyslipidemia  Lipid panel, POCT  glycosylated hemoglobin (Hb A1C)  6. COPD (chronic obstructive pulmonary disease); chronic bronchitis    7. Tobacco abuse    8. BMI 40.0-44.9, adult    9. OSA (obstructive sleep apnea)    10. Insomnia    11. IBS (irritable bowel syndrome)  hyoscyamine (LEVBID) 0.375 MG 12 hr tablet  12. Hypogonadism male    106. DJD (degenerative joint disease)    14. Rotator cuff arthropathy, left    15. GERD (gastroesophageal reflux disease)     Zostavax prescription provided

## 2012-01-15 ENCOUNTER — Encounter: Payer: Self-pay | Admitting: Family Medicine

## 2012-01-15 DIAGNOSIS — E119 Type 2 diabetes mellitus without complications: Secondary | ICD-10-CM | POA: Insufficient documentation

## 2012-01-15 DIAGNOSIS — E785 Hyperlipidemia, unspecified: Secondary | ICD-10-CM | POA: Insufficient documentation

## 2012-01-15 DIAGNOSIS — K219 Gastro-esophageal reflux disease without esophagitis: Secondary | ICD-10-CM | POA: Insufficient documentation

## 2012-01-15 DIAGNOSIS — K589 Irritable bowel syndrome without diarrhea: Secondary | ICD-10-CM | POA: Insufficient documentation

## 2012-01-15 DIAGNOSIS — I2510 Atherosclerotic heart disease of native coronary artery without angina pectoris: Secondary | ICD-10-CM | POA: Insufficient documentation

## 2012-01-15 DIAGNOSIS — M12812 Other specific arthropathies, not elsewhere classified, left shoulder: Secondary | ICD-10-CM | POA: Insufficient documentation

## 2012-01-15 DIAGNOSIS — I10 Essential (primary) hypertension: Secondary | ICD-10-CM | POA: Insufficient documentation

## 2012-01-15 DIAGNOSIS — Z720 Tobacco use: Secondary | ICD-10-CM | POA: Insufficient documentation

## 2012-01-15 DIAGNOSIS — M1990 Unspecified osteoarthritis, unspecified site: Secondary | ICD-10-CM | POA: Insufficient documentation

## 2012-01-15 DIAGNOSIS — Z6841 Body Mass Index (BMI) 40.0 and over, adult: Secondary | ICD-10-CM | POA: Insufficient documentation

## 2012-01-15 DIAGNOSIS — G4700 Insomnia, unspecified: Secondary | ICD-10-CM | POA: Insufficient documentation

## 2012-01-15 DIAGNOSIS — G4733 Obstructive sleep apnea (adult) (pediatric): Secondary | ICD-10-CM | POA: Insufficient documentation

## 2012-01-15 DIAGNOSIS — E291 Testicular hypofunction: Secondary | ICD-10-CM | POA: Insufficient documentation

## 2012-01-15 DIAGNOSIS — J449 Chronic obstructive pulmonary disease, unspecified: Secondary | ICD-10-CM | POA: Insufficient documentation

## 2012-01-17 ENCOUNTER — Encounter: Payer: Self-pay | Admitting: Internal Medicine

## 2012-01-30 ENCOUNTER — Encounter: Payer: MEDICARE | Attending: Family Medicine | Admitting: *Deleted

## 2012-01-30 VITALS — Ht 66.75 in | Wt 234.9 lb

## 2012-01-30 DIAGNOSIS — E119 Type 2 diabetes mellitus without complications: Secondary | ICD-10-CM | POA: Insufficient documentation

## 2012-01-30 DIAGNOSIS — Z713 Dietary counseling and surveillance: Secondary | ICD-10-CM | POA: Insufficient documentation

## 2012-01-31 ENCOUNTER — Encounter: Payer: Self-pay | Admitting: *Deleted

## 2012-01-31 NOTE — Patient Instructions (Signed)
Patient will attend Core Diabetes Courses II and III as scheduled or follow up prn.  

## 2012-01-31 NOTE — Progress Notes (Signed)
  Patient was seen on 01/30/12 for the first of a series of three diabetes self-management courses [facility]. The following learning objectives were met by the patient during this course:   Defines the role of glucose and insulin  Identifies type of diabetes and pathophysiology  Defines the diagnostic criteria for diabetes and prediabetes  States the risk factors for Type 2 Diabetes  States the symptoms of Type 2 Diabetes  Defines Type 2 Diabetes treatment goals  Defines Type 2 Diabetes treatment options  States the rationale for glucose monitoring  Identifies A1C, glucose targets, and testing times  Identifies proper sharps disposal  Defines the purpose of a diabetes food plan  Identifies carbohydrate food groups  Defines effects of carbohydrate foods on glucose levels  Identifies carbohydrate choices/grams/food labels  States benefits of physical activity and effect on glucose  Review of suggested activity guidelines  Lab Results  Component Value Date   HGBA1C 5.9 01/14/2012    Handouts given during class include:  Type 2 Diabetes: Basics Book  My Food Plan Book  Food and Activity Log  Patient has established the following initial goals:  Increase exercise.  Follow a diabetes meal plan.  Monitor blood glucose.  Lose weight.  Follow-Up Plan: Patient will attend Core Diabetes Courses II and III as scheduled or follow up prn.

## 2012-02-21 ENCOUNTER — Telehealth: Payer: Self-pay | Admitting: Family Medicine

## 2012-02-21 MED ORDER — TRAZODONE HCL 50 MG PO TABS: 25.0000 mg | ORAL_TABLET | Freq: Every evening | ORAL | Status: DC | PRN

## 2012-02-21 MED ORDER — SILDENAFIL CITRATE 50 MG PO TABS: 50.0000 mg | ORAL_TABLET | Freq: Every day | ORAL | Status: DC | PRN

## 2012-02-21 NOTE — Telephone Encounter (Signed)
Patient called and requested refill of Trazodone and Viagra. Medications e prescribed to [facility] on [facility]

## 2012-02-23 NOTE — Progress Notes (Signed)
This encounter was created in error - please disregard.

## 2012-02-28 ENCOUNTER — Ambulatory Visit: Payer: MEDICARE

## 2012-03-06 ENCOUNTER — Ambulatory Visit: Payer: MEDICARE

## 2012-06-29 ENCOUNTER — Other Ambulatory Visit: Payer: Self-pay | Admitting: Internal Medicine

## 2012-07-23 ENCOUNTER — Telehealth: Payer: Self-pay

## 2012-07-23 DIAGNOSIS — G4700 Insomnia, unspecified: Secondary | ICD-10-CM

## 2012-07-23 MED ORDER — ATORVASTATIN CALCIUM 40 MG PO TABS: 40.0000 mg | ORAL_TABLET | Freq: Every day | ORAL | Status: AC

## 2012-07-23 MED ORDER — METFORMIN HCL 1000 MG PO TABS: 1000.0000 mg | ORAL_TABLET | Freq: Two times a day (BID) | ORAL | Status: DC

## 2012-07-23 MED ORDER — INSULIN NPH (HUMAN) (ISOPHANE) 100 UNIT/ML ~~LOC~~ SUSP: 15.0000 [IU] | Freq: Every day | SUBCUTANEOUS | Status: DC

## 2012-07-23 MED ORDER — TRAZODONE HCL 50 MG PO TABS: 25.0000 mg | ORAL_TABLET | Freq: Every evening | ORAL | Status: DC | PRN

## 2012-07-23 MED ORDER — LISINOPRIL-HYDROCHLOROTHIAZIDE 20-12.5 MG PO TABS: 1.0000 | ORAL_TABLET | Freq: Every day | ORAL | Status: DC

## 2012-07-23 NOTE — Telephone Encounter (Signed)
Pt came into 102 with a list of medications he is taking and asked that we get RFs sent to his pharmacy. He stated that he had been in to see a provider at appt center in July/Aug/Sept after he saw Dr Hal Hope in April and the provider was going to send in Rxs for him w/RFs so that he wouldn't have to call each month. I advised pt that I do not see a record of that visit, but that I will check to see if I can find a record of that OV. Checked for duplicate records and in pt's paper chart to make sure it wasn't IA. Also spoke with Marylene Land in Billing and she verified that there have been no charges posted to pt's acct since April.  Pt requested RFs on lisinopril/HCTZ 20-12.5, lipitor 40, Metformin 1000 BID, Novolin 15 units QD, and Trazodone 50 mg. Can we RF these? How many times, just once and then pt needs OV?

## 2012-07-23 NOTE — Telephone Encounter (Signed)
Patient advised. He is going to call tomorrow and make appt.

## 2012-07-23 NOTE — Telephone Encounter (Signed)
I looked at [facility] and he has not had labs since 4/13, I wonder if he came in with his wife and that is what he remembers.  I can send in 1 month of meds and then we need to see patient.  If he makes an appt and he will not have enough meds to last until that appt I can extend refills until then.

## 2012-07-30 ENCOUNTER — Telehealth: Payer: Self-pay

## 2012-07-30 NOTE — Telephone Encounter (Signed)
Pt states he would like copies of his records to pick up, due to his health illnesses he is not able to set for long periods of time in our waiting room and we do not schedule appointments for illness,   Please call [phone]  When ready to pick up

## 2012-07-30 NOTE — Telephone Encounter (Signed)
Records ready for pickup. Patient notified. °

## 2013-05-26 ENCOUNTER — Ambulatory Visit (INDEPENDENT_AMBULATORY_CARE_PROVIDER_SITE_OTHER): Payer: MEDICARE | Admitting: Internal Medicine

## 2013-05-26 ENCOUNTER — Encounter: Payer: Self-pay | Admitting: Internal Medicine

## 2013-05-26 VITALS — BP 126/92 | HR 80 | Wt 244.0 lb

## 2013-05-26 DIAGNOSIS — F17200 Nicotine dependence, unspecified, uncomplicated: Secondary | ICD-10-CM

## 2013-05-26 DIAGNOSIS — I2510 Atherosclerotic heart disease of native coronary artery without angina pectoris: Secondary | ICD-10-CM

## 2013-05-26 DIAGNOSIS — R0789 Other chest pain: Secondary | ICD-10-CM

## 2013-05-26 DIAGNOSIS — Z720 Tobacco use: Secondary | ICD-10-CM

## 2013-05-26 DIAGNOSIS — Z6841 Body Mass Index (BMI) 40.0 and over, adult: Secondary | ICD-10-CM

## 2013-05-26 DIAGNOSIS — E119 Type 2 diabetes mellitus without complications: Secondary | ICD-10-CM

## 2013-05-26 NOTE — Assessment & Plan Note (Signed)
The patient continues to have chest pain which has both typical as well as atypical components. Because of his multiple cardiac risk factors, including diabetes, and because he is unable to exercise due to his prior knee replacement surgery x2, I've recommended that the patient undergo exercise stress testing utilizing lexiscan. Depending on the results, we will determine what other evaluation is warranted if any. No change in medical therapy today.

## 2013-05-26 NOTE — Assessment & Plan Note (Signed)
I discussed the importance of stopping smoking, the patient states that he will try.

## 2013-05-26 NOTE — Assessment & Plan Note (Signed)
He is morbidly obese, and we discussed the importance of weight loss.

## 2013-05-26 NOTE — Patient Instructions (Signed)
Your physician recommends that you schedule a follow-up appointment in: 3 months with Dr Ladona Ridgel  Your physician has requested that you have a lexiscan myoview. For further information please visit [email or web address] Please follow instruction sheet, as given.

## 2013-05-26 NOTE — Progress Notes (Signed)
HPI Justin Trujillo returns today to establish after the retirement of Dr. Daleen Squibb. Justin Trujillo is a very pleasant 67 year old man with multiple cardiac risk factors, hypertension, diabetes, dyslipidemia, and ongoing tobacco abuse. The patient has chest tightness which occurs with emotional stress. He has undergone catheterization in her low past, demonstrating nonobstructive coronary disease. The patient denies syncope. The patient is very sedentary. 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 Allergies  Allergen Reactions  . Latex   . Moxifloxacin     REACTION: GI upset, throat "tightened up"  . Penicillins      Current Outpatient Prescriptions  Medication Sig Dispense Refill  . albuterol (PROVENTIL) (2.5 MG/3ML) 0.083% nebulizer solution Take 2.5 mg by nebulization every 6 (six) hours as needed.        Marland Kitchen albuterol-ipratropium (COMBIVENT) 18-103 MCG/ACT inhaler Inhale 2 puffs into the lungs every 6 (six) hours as needed.        Marland Kitchen aspirin 81 MG tablet Take 81 mg by mouth daily.      Marland Kitchen atorvastatin (LIPITOR) 40 MG tablet Take 1 tablet (40 mg total) by mouth daily.  30 tablet  0  . benzonatate (TESSALON) 200 MG capsule Take 1 capsule (200 mg total) by mouth 3 (three) times daily as needed.  30 capsule  0  . budesonide-formoterol (SYMBICORT) 80-4.5 MCG/ACT inhaler Inhale 2 puffs into the lungs 2 (two) times daily.        . cetirizine (ZYRTEC) 10 MG tablet Take 10 mg by mouth at bedtime.        . Cholecalciferol (VITAMIN D-3) 5000 UNITS TABS Take 1 tablet by mouth daily.      . cyclobenzaprine (FLEXERIL) 10 MG tablet Take 10 mg by mouth 3 (three) times daily as needed.        Marland Kitchen dextromethorphan-guaiFENesin (MUCINEX DM) 30-600 MG per 12 hr tablet Take 1 tablet by mouth every 12 (twelve) hours.        . hydrochlorothiazide 25 MG tablet Take 25 mg by mouth daily.        Marland Kitchen HYDROcodone-acetaminophen (VICODIN ES) 7.5-750 MG per tablet Take 1 tablet by mouth every 6 (six)  hours as needed.        . insulin NPH (HUMULIN N,NOVOLIN N) 100 UNIT/ML injection Inject 15 Units into the skin daily before breakfast. Pt to increase insulin by 2 units every 4 days until fasting bs less than 120  1 vial  0  . lisinopril-hydrochlorothiazide (PRINZIDE,ZESTORETIC) 20-12.5 MG per tablet Take 1 tablet by mouth daily.  30 tablet  0  . meloxicam (MOBIC) 15 MG tablet Take 15 mg by mouth daily as needed.        . metFORMIN (GLUCOPHAGE) 1000 MG tablet Take 1 tablet (1,000 mg total) by mouth 2 (two) times daily with a meal.  60 tablet  0  . Multiple Vitamins-Minerals (CENTRUM SILVER PO) Take 1 tablet by mouth daily.      . Olopatadine HCl (PATANASE) 0.6 % SOLN Place 2 puffs into the nose at bedtime.        Marland Kitchen omeprazole (PRILOSEC OTC) 20 MG tablet Take 20 mg by mouth daily before breakfast.        . oxyCODONE-acetaminophen (PERCOCET) 10-325 MG per tablet Take 1 tablet by mouth every 4 (four) hours as needed.        . pioglitazone-metformin (ACTOPLUS MET) 15-850 MG per tablet Take 1 tablet by mouth 2 (two) times daily with a meal.        .  rosuvastatin (CRESTOR) 40 MG tablet Take 40 mg by mouth at bedtime.        . valsartan (DIOVAN) 80 MG tablet Take 80 mg by mouth daily.        . hyoscyamine (LEVBID) 0.375 MG 12 hr tablet Take 1 tablet (0.375 mg total) by mouth every 12 (twelve) hours as needed for cramping.  60 tablet  0  . sildenafil (VIAGRA) 50 MG tablet Take 1 tablet (50 mg total) by mouth daily as needed for erectile dysfunction.  10 tablet  0  . traZODone (DESYREL) 50 MG tablet Take 0.5-1 tablets (25-50 mg total) by mouth at bedtime as needed for sleep.  30 tablet  0   No current facility-administered medications for this visit.     Past Medical History  Diagnosis Date  . Coronary atherosclerosis of native coronary artery   . Other emphysema   . Shortness of breath   . Obstructive chronic bronchitis with exacerbation   . Obstructive sleep apnea (adult) (pediatric)   . Type II  or unspecified type diabetes mellitus without mention of complication, not stated as uncontrolled   . Other and unspecified hyperlipidemia   . Unspecified essential hypertension   . Hypogonadism male     ROS:   All systems reviewed and negative except as noted in the HPI.   Past Surgical History  Procedure Laterality Date  . L lens implant    . Vascectomy    . R knee replacement x2    . Tonsillectomy and adenoidectomy       Family History  Problem Relation Age of Onset  . Emphysema Father   . Heart disease Father   . Clotting disorder Mother      History   Social History  . Marital Status: Married    Spouse Name: N/A    Number of Children: N/A  . Years of Education: N/A   Occupational History  . legal Print production planner    Social History Main Topics  . Smoking status: Current Every Day Smoker -- 0.30 packs/day    Types: Cigarettes  . Smokeless tobacco: Not on file  . Alcohol Use: Not on file  . Drug Use: Not on file  . Sexual Activity: Not on file   Other Topics Concern  . Not on file   Social History Narrative  . No narrative on file     BP 126/92  Pulse 80  Wt 244 lb (110.678 kg)  BMI 38.52 kg/m2  Physical Exam:  obese appearing 68 year old man, NAD HEENT: Unremarkable Neck:  7 cm JVD, no thyromegally, no obvious bruit Lymphatics:  No adenopathy Back:  No CVA tenderness Lungs:  Clear with no wheezes, rales, or rhonchi. HEART:  Regular rate rhythm, no murmurs, no rubs, no clicks Abd:  soft, obese, positive bowel sounds, no organomegally, no rebound, no guarding Ext:  2 plus pulses, no edema, no cyanosis, no clubbing Skin:  No rashes no nodules Neuro:  CN II through XII intact, motor grossly intact  EKG Normal sinus rhythm with incomplete right bundle branch block, and left posterior fascicular block  Assess/Plan:

## 2013-05-27 ENCOUNTER — Encounter: Payer: MEDICARE | Admitting: Cardiology

## 2013-06-08 ENCOUNTER — Encounter: Payer: MEDICARE | Admitting: Cardiology

## 2013-06-08 ENCOUNTER — Ambulatory Visit (HOSPITAL_COMMUNITY): Payer: Medicare Other | Attending: Cardiology | Admitting: Radiology

## 2013-06-08 VITALS — BP 118/72 | Ht 67.0 in | Wt 241.0 lb

## 2013-06-08 DIAGNOSIS — E119 Type 2 diabetes mellitus without complications: Secondary | ICD-10-CM

## 2013-06-08 DIAGNOSIS — I2510 Atherosclerotic heart disease of native coronary artery without angina pectoris: Secondary | ICD-10-CM

## 2013-06-08 DIAGNOSIS — R0602 Shortness of breath: Secondary | ICD-10-CM

## 2013-06-08 DIAGNOSIS — F17200 Nicotine dependence, unspecified, uncomplicated: Secondary | ICD-10-CM | POA: Insufficient documentation

## 2013-06-08 DIAGNOSIS — Z8249 Family history of ischemic heart disease and other diseases of the circulatory system: Secondary | ICD-10-CM | POA: Insufficient documentation

## 2013-06-08 DIAGNOSIS — I4510 Unspecified right bundle-branch block: Secondary | ICD-10-CM | POA: Insufficient documentation

## 2013-06-08 DIAGNOSIS — R0789 Other chest pain: Secondary | ICD-10-CM

## 2013-06-08 DIAGNOSIS — Z794 Long term (current) use of insulin: Secondary | ICD-10-CM | POA: Insufficient documentation

## 2013-06-08 DIAGNOSIS — R0609 Other forms of dyspnea: Secondary | ICD-10-CM | POA: Insufficient documentation

## 2013-06-08 DIAGNOSIS — J438 Other emphysema: Secondary | ICD-10-CM | POA: Insufficient documentation

## 2013-06-08 DIAGNOSIS — R002 Palpitations: Secondary | ICD-10-CM | POA: Insufficient documentation

## 2013-06-08 DIAGNOSIS — I10 Essential (primary) hypertension: Secondary | ICD-10-CM | POA: Insufficient documentation

## 2013-06-08 DIAGNOSIS — R0989 Other specified symptoms and signs involving the circulatory and respiratory systems: Secondary | ICD-10-CM | POA: Insufficient documentation

## 2013-06-08 DIAGNOSIS — R5381 Other malaise: Secondary | ICD-10-CM | POA: Insufficient documentation

## 2013-06-08 DIAGNOSIS — R079 Chest pain, unspecified: Secondary | ICD-10-CM | POA: Insufficient documentation

## 2013-06-08 DIAGNOSIS — Z720 Tobacco use: Secondary | ICD-10-CM

## 2013-06-08 MED ORDER — TECHNETIUM TC 99M SESTAMIBI GENERIC - CARDIOLITE
33.0000 | Freq: Once | INTRAVENOUS | Status: AC | PRN
  Administered 2013-06-08: 33 via INTRAVENOUS

## 2013-06-08 MED ORDER — REGADENOSON 0.4 MG/5ML IV SOLN
0.4000 mg | Freq: Once | INTRAVENOUS | Status: AC
  Administered 2013-06-08: 0.4 mg via INTRAVENOUS

## 2013-06-08 MED ORDER — TECHNETIUM TC 99M SESTAMIBI GENERIC - CARDIOLITE
11.0000 | Freq: Once | INTRAVENOUS | Status: AC | PRN
  Administered 2013-06-08: 11 via INTRAVENOUS

## 2013-06-08 NOTE — Progress Notes (Signed)
 [facility] SITE 3 NUCLEAR MED [address] [place], [place] [address] [phone]    Cardiology Nuclear Med Study  Justin Trujillo is a 67 y.o. male     MRN : [id number]     DOB: 1946/04/11  Procedure Date: 06/08/2013  Nuclear Med Background Indication for Stress Test:  Evaluation for Ischemia History:  COPD, Emphysema and 4/07 Heart Cath: N/O CAD NL EF, 07/18/10 MPS: NL EF: 53%, 08/2010 ECHO: EF: 55-60%  Cardiac Risk Factors: Family History - CAD, Hypertension, IDDM Type 2, Lipids, RBBB and Smoker  Symptoms:  Chest Pain, Chest Pressure, DOE, Fatigue and Palpitations   Nuclear Pre-Procedure Caffeine/Decaff Intake:  None > 12 hrs NPO After: 7:00pm   Lungs:  clear O2 Sat: 96% on room air. IV 0.9% NS with Angio Cath:  22g  IV Site: R Hand x 1, tolerated well IV Started by:  Irean Hong, RN  Chest Size (in):  48 Cup Size: n/a  Height: 5\' 7"  (1.702 m)  Weight:  241 lb (109.317 kg)  BMI:  Body mass index is 37.74 kg/(m^2). Tech Comments:  Fasting CBG was 149 at 0500 today. No insulin for one week per patient. Irean Hong, RN    Nuclear Med Study 1 or 2 day study: 1 day  Stress Test Type:  Treadmill/Lexiscan  Reading MD: Marca Ancona, MD  Order Authorizing Provider:  Lewayne Bunting, MD  Resting Radionuclide: Technetium 53m Sestamibi  Resting Radionuclide Dose: 11.0 mCi   Stress Radionuclide:  Technetium 19m Sestamibi  Stress Radionuclide Dose: 33.0 mCi           Stress Protocol Rest HR: 79 Stress HR: 108  Rest BP: 118/72 Stress BP: 126/72  Exercise Time (min): n/a METS: n/a   Predicted Max HR: 152 bpm % Max HR: 71.05 bpm Rate Pressure Product: 25366   Dose of Adenosine (mg):  n/a Dose of Lexiscan: 0.4 mg  Dose of Atropine (mg): n/a Dose of Dobutamine: n/a mcg/kg/min (at max HR)  Stress Test Technologist: Milana Na, EMT-P  Nuclear Technologist:  Doyne Keel, CNMT     Rest Procedure:  Myocardial perfusion imaging was performed at rest 45 minutes  following the intravenous administration of Technetium 91m Sestamibi. Rest ECG: NSR - Normal EKG  Stress Procedure:  The patient received IV Lexiscan 0.4 mg over 15-seconds with concurrent low level exercise and then Technetium 68m Sestamibi was injected at 30-seconds while the patient continued walking one more minute. This patient was sob, occ pvcs, and warm the Lexiscan injection. Quantitative spect images were obtained after a 45-minute delay. Stress ECG: No significant change from baseline ECG  QPS Raw Data Images:  Normal; no motion artifact; normal heart/lung ratio. Stress Images:  Normal homogeneous uptake in all areas of the myocardium. Rest Images:  Normal homogeneous uptake in all areas of the myocardium. Subtraction (SDS):  There is no evidence of scar or ischemia. Transient Ischemic Dilatation (Normal <1.22):  n/a Lung/Heart Ratio (Normal <0.45):  0.45  Quantitative Gated Spect Images QGS EDV:  n/a QGS ESV:  n/a  Impression Exercise Capacity:  Lexiscan with no exercise. BP Response:  Normal blood pressure response. Clinical Symptoms:  Short of breath. ECG Impression:  No significant ST segment change suggestive of ischemia. Comparison with Prior Nuclear Study: No significant change from previous study  Overall Impression:  Normal stress nuclear study.  LV Ejection Fraction: Study not gated.  LV Wall Motion:  Study not gated  [employer] 06/08/2013

## 2013-06-15 ENCOUNTER — Telehealth: Payer: Self-pay | Admitting: *Deleted

## 2013-06-15 NOTE — Telephone Encounter (Signed)
Stress test is normal.  Patient is aware He will continue all his medications as prescribed and follow up in 3 months as directed

## 2013-08-25 ENCOUNTER — Encounter: Payer: Self-pay | Admitting: Internal Medicine

## 2013-08-25 ENCOUNTER — Ambulatory Visit (INDEPENDENT_AMBULATORY_CARE_PROVIDER_SITE_OTHER): Payer: Medicare Other | Admitting: Internal Medicine

## 2013-08-25 ENCOUNTER — Encounter (INDEPENDENT_AMBULATORY_CARE_PROVIDER_SITE_OTHER): Payer: Self-pay

## 2013-08-25 VITALS — BP 124/78 | HR 88 | Ht 67.0 in | Wt 233.1 lb

## 2013-08-25 DIAGNOSIS — I2510 Atherosclerotic heart disease of native coronary artery without angina pectoris: Secondary | ICD-10-CM

## 2013-08-25 DIAGNOSIS — I10 Essential (primary) hypertension: Secondary | ICD-10-CM

## 2013-08-25 DIAGNOSIS — F17200 Nicotine dependence, unspecified, uncomplicated: Secondary | ICD-10-CM

## 2013-08-25 NOTE — Progress Notes (Signed)
HPI [name] returns today to establish after the retirement of Dr. Daleen Squibb. [name] is a very pleasant 67 year old man with multiple cardiac risk factors, hypertension, diabetes, dyslipidemia, and ongoing tobacco abuse. The patient has chest tightness which occurs with emotional stress. He has undergone catheterization in the past, demonstrating nonobstructive coronary disease. The patient denies syncope. Approximately 3 months ago, he had had worsening chest tightness and he underwent stress testing with normal perfusion demonstrated. In the interim he has done well. He notes that his stress is improved. He has had only one episode of chest discomfort. Allergies  Allergen Reactions  . Latex   . Moxifloxacin     REACTION: GI upset, throat "tightened up"  . Penicillins      Current Outpatient Prescriptions  Medication Sig Dispense Refill  . albuterol (PROVENTIL) (2.5 MG/3ML) 0.083% nebulizer solution Take 2.5 mg by nebulization every 6 (six) hours as needed.        Marland Kitchen albuterol-ipratropium (COMBIVENT) 18-103 MCG/ACT inhaler Inhale 2 puffs into the lungs every 6 (six) hours as needed.        . ALPRAZolam (XANAX) 0.5 MG tablet Take 0.5 mg by mouth as needed for anxiety.      Marland Kitchen aspirin 81 MG tablet Take 81 mg by mouth daily.      Marland Kitchen atorvastatin (LIPITOR) 40 MG tablet Take 1 tablet (40 mg total) by mouth daily.  30 tablet  0  . insulin NPH (HUMULIN N,NOVOLIN N) 100 UNIT/ML injection Inject 15 Units into the skin daily before breakfast. Pt to increase insulin by 2 units every 4 days until fasting bs less than 120  1 vial  0  . lisinopril-hydrochlorothiazide (PRINZIDE,ZESTORETIC) 20-12.5 MG per tablet Take 1 tablet by mouth daily.  30 tablet  0  . metFORMIN (GLUCOPHAGE) 1000 MG tablet Take 1 tablet (1,000 mg total) by mouth 2 (two) times daily with a meal.  60 tablet  0  . Multiple Vitamins-Minerals (CENTRUM SILVER PO) Take 1 tablet by mouth daily.      Marland Kitchen NITROSTAT 0.3 MG SL tablet Place 0.3 mg under  the tongue every 5 (five) minutes as needed.       . traZODone (DESYREL) 50 MG tablet Take 0.5-1 tablets (25-50 mg total) by mouth at bedtime as needed for sleep.  30 tablet  0   No current facility-administered medications for this visit.     Past Medical History  Diagnosis Date  . Coronary atherosclerosis of native coronary artery   . Other emphysema   . Shortness of breath   . Obstructive chronic bronchitis with exacerbation   . Obstructive sleep apnea (adult) (pediatric)   . Type II or unspecified type diabetes mellitus without mention of complication, not stated as uncontrolled   . Other and unspecified hyperlipidemia   . Unspecified essential hypertension   . Hypogonadism male     ROS:   All systems reviewed and negative except as noted in the HPI.   Past Surgical History  Procedure Laterality Date  . L lens implant    . Vascectomy    . R knee replacement x2    . Tonsillectomy and adenoidectomy       Family History  Problem Relation Age of Onset  . Emphysema Father   . Heart disease Father   . Clotting disorder Mother      History   Social History  . Marital Status: Married    Spouse Name: N/A    Number of Children: N/A  .  Years of Education: N/A   Occupational History  . legal Print production planner    Social History Main Topics  . Smoking status: Current Every Day Smoker -- 0.30 packs/day    Types: Cigarettes  . Smokeless tobacco: Not on file  . Alcohol Use: Not on file  . Drug Use: Not on file  . Sexual Activity: Not on file   Other Topics Concern  . Not on file   Social History Narrative  . No narrative on file     BP 124/78  Pulse 88  Ht 5\' 7"  (1.702 m)  Wt 233 lb 1.9 oz (105.743 kg)  BMI 36.50 kg/m2  Physical Exam:  obese appearing 67 year old man, NAD HEENT: Unremarkable Neck:  7 cm JVD, no thyromegally, no obvious bruit Back:  No CVA tenderness Lungs:  Clear with no wheezes, rales, or rhonchi.  HEART:  Regular rate  rhythm, no murmurs, no rubs, no clicks Abd:  soft, obese, positive bowel sounds, no organomegally, no rebound, no guarding Ext:  2 plus pulses, no edema, no cyanosis, no clubbing Skin:  No rashes no nodules Neuro:  CN II through XII intact, motor grossly intact  EKG Normal sinus rhythm with incomplete right bundle branch block, and left posterior fascicular block  Assess/Plan:

## 2013-08-25 NOTE — Patient Instructions (Signed)
Your physician wants you to follow-up in: 12 months with Dr. Taylor. You will receive a reminder letter in the mail two months in advance. If you don't receive a letter, please call our office to schedule the follow-up appointment.    

## 2013-08-25 NOTE — Assessment & Plan Note (Signed)
He denies anginal symptoms at the current time. He'll continue his current medical therapy, and I've encouraged the patient to increase his physical activity.

## 2013-08-25 NOTE — Assessment & Plan Note (Signed)
I've encouraged the patient to stop smoking. He is down to three quarters pack of cigarettes daily. Hopefully he will continue to reduce his tobacco consumption.

## 2013-08-25 NOTE — Assessment & Plan Note (Signed)
His blood pressure is well controlled. No change in medical therapy. He'll maintain a low-sodium diet, and I've encouraged the patient to lose weight.

## 2014-08-25 ENCOUNTER — Encounter: Payer: Self-pay | Admitting: Internal Medicine

## 2014-08-25 ENCOUNTER — Ambulatory Visit (INDEPENDENT_AMBULATORY_CARE_PROVIDER_SITE_OTHER): Payer: Medicare Other | Admitting: Internal Medicine

## 2014-08-25 VITALS — BP 124/78 | HR 88 | Ht 67.0 in | Wt 232.4 lb

## 2014-08-25 DIAGNOSIS — Z720 Tobacco use: Secondary | ICD-10-CM

## 2014-08-25 DIAGNOSIS — I2510 Atherosclerotic heart disease of native coronary artery without angina pectoris: Secondary | ICD-10-CM

## 2014-08-25 DIAGNOSIS — I10 Essential (primary) hypertension: Secondary | ICD-10-CM

## 2014-08-25 NOTE — Progress Notes (Signed)
HPI Justin Trujillo returns today for followup. He is a 68 year old man with multiple cardiac risk factors, hypertension, diabetes, dyslipidemia, and ongoing tobacco abuse. The patient has chest tightness which occurs with emotional stress. He has undergone catheterization in the past, demonstrating nonobstructive coronary disease. The patient denies syncope. In the interim he has done well.  He denies chest pressure or peripheral edema. He gets rare discomfort in his neck with exertion which goes away with rest. He has complained of knee and back discomfort. The patient continues to smoke cigarettes, and drink caffeine in excess. He has lost approximately 10 pounds.  Allergies  Allergen Reactions  . Iodinated Diagnostic Agents Anaphylaxis  . Penicillins Anaphylaxis  . Shellfish-Derived Products Anaphylaxis  . Moxifloxacin     REACTION: GI upset, throat "tightened up"  . Latex Rash    Over long periods of time     Current Outpatient Prescriptions  Medication Sig Dispense Refill  . albuterol (PROVENTIL) (2.5 MG/3ML) 0.083% nebulizer solution Take 2.5 mg by nebulization every 6 (six) hours as needed for wheezing or shortness of breath.     Marland Kitchen albuterol-ipratropium (COMBIVENT) 18-103 MCG/ACT inhaler Inhale 2 puffs into the lungs every 6 (six) hours as needed for wheezing.     Marland Kitchen ALPRAZolam (XANAX) 0.5 MG tablet Take 0.5 mg by mouth daily as needed for anxiety.     Marland Kitchen aspirin 81 MG tablet Take 81 mg by mouth daily.    Marland Kitchen atorvastatin (LIPITOR) 40 MG tablet Take 1 tablet (40 mg total) by mouth daily. 30 tablet 0  . gabapentin (NEURONTIN) 300 MG capsule Take 1 tablet by mouth in the morning and take 2 tablets by mouth at night    . guaiFENesin-codeine (ROBITUSSIN AC) 100-10 MG/5ML syrup Take 10 mLs by mouth daily as needed. cough    . insulin NPH (HUMULIN N,NOVOLIN N) 100 UNIT/ML injection Inject 15 Units into the skin daily before breakfast. Pt to increase insulin by 2 units every 4 days until fasting bs  less than 120 (Patient taking differently: If fasting blood sugar is over 150 in the morning inject 10 units into the skin, if it goes over 200 inject 15 units into the skin) 1 vial 0  . lisinopril-hydrochlorothiazide (ZESTORETIC) 10-12.5 MG per tablet Take 1 tablet by mouth daily.    . metFORMIN (GLUCOPHAGE) 1000 MG tablet Take 1 tablet (1,000 mg total) by mouth 2 (two) times daily with a meal. 60 tablet 0  . NITROSTAT 0.3 MG SL tablet Place 0.3 mg under the tongue every 5 (five) minutes as needed for chest pain (MAX 3 TABLETS).     . traZODone (DESYREL) 50 MG tablet Take 50 mg by mouth at bedtime as needed for sleep.     No current facility-administered medications for this visit.     Past Medical History  Diagnosis Date  . Coronary atherosclerosis of native coronary artery   . Other emphysema   . Shortness of breath   . Obstructive chronic bronchitis with exacerbation   . Obstructive sleep apnea (adult) (pediatric)   . Type II or unspecified type diabetes mellitus without mention of complication, not stated as uncontrolled   . Other and unspecified hyperlipidemia   . Unspecified essential hypertension   . Hypogonadism male     ROS:   All systems reviewed and negative except as noted in the HPI.   Past Surgical History  Procedure Laterality Date  . L lens implant    . Vascectomy    . R  knee replacement x2    . Tonsillectomy and adenoidectomy       Family History  Problem Relation Age of Onset  . Emphysema Father   . Heart disease Father   . Clotting disorder Mother      History   Social History  . Marital Status: Married    Spouse Name: N/A    Number of Children: N/A  . Years of Education: N/A   Occupational History  . legal Therapist, nutritional    Social History Main Topics  . Smoking status: Current Every Day Smoker -- 0.30 packs/day    Types: Cigarettes  . Smokeless tobacco: Not on file  . Alcohol Use: Not on file  . Drug Use: Not on file  .  Sexual Activity: Not on file   Other Topics Concern  . Not on file   Social History Narrative  . No narrative on file     BP 124/78 mmHg  Pulse 88  Ht 5\' 7"  (1.702 m)  Wt 232 lb 6.4 oz (105.416 kg)  BMI 36.39 kg/m2  Physical Exam:  obese appearing 68 year old man, NAD HEENT: Unremarkable Neck:  7 cm JVD, no thyromegally, no obvious bruit Back:  No CVA tenderness Lungs:  Clear with no wheezes, rales, or rhonchi.  HEART:  Regular rate rhythm, no murmurs, no rubs, no clicks Abd:  soft, obese, positive bowel sounds, no organomegally, no rebound, no guarding Ext:  2 plus pulses, no edema, no cyanosis, no clubbing Skin:  No rashes no nodules Neuro:  CN II through XII intact, motor grossly intact  EKG Normal sinus rhythm with right axis deviation.  Assess/Plan:

## 2014-08-25 NOTE — Assessment & Plan Note (Signed)
The patient has had minimal if any anginal symptoms. I've asked that he continue to increase his physical activity, stop smoking, and lose weight. He will continue his current medications.

## 2014-08-25 NOTE — Assessment & Plan Note (Signed)
We discussed the importance of stopping smoking. He states that he will try to cut back. He is currently smoking a pack a day.

## 2014-08-25 NOTE — Patient Instructions (Signed)
Your physician wants you to follow-up in: 12 months with Dr. Lovena Le  reminder letter in the mail two months in advance. If you don't receive a letter, please call our office to schedule the follow-up appointment.

## 2014-08-25 NOTE — Assessment & Plan Note (Signed)
His blood pressure is well controlled. He will continue his current medication. I've encouraged the patient to lose weight and reduce his salt intake.

## 2016-08-03 ENCOUNTER — Encounter: Payer: Self-pay | Admitting: Internal Medicine

## 2016-08-03 ENCOUNTER — Ambulatory Visit (INDEPENDENT_AMBULATORY_CARE_PROVIDER_SITE_OTHER): Payer: Medicare Other | Admitting: Internal Medicine

## 2016-08-03 VITALS — BP 110/70 | HR 92 | Ht 67.0 in | Wt 237.2 lb

## 2016-08-03 DIAGNOSIS — I10 Essential (primary) hypertension: Secondary | ICD-10-CM | POA: Diagnosis not present

## 2016-08-03 DIAGNOSIS — I2510 Atherosclerotic heart disease of native coronary artery without angina pectoris: Secondary | ICD-10-CM

## 2016-08-03 NOTE — Patient Instructions (Addendum)

## 2016-08-03 NOTE — Progress Notes (Signed)
HPI [name] returns today for followup. He is a 70 year old man with multiple cardiac risk factors, hypertension, diabetes, dyslipidemia, and ongoing tobacco abuse. The patient has chest tightness which occurs with emotional stress. He has undergone catheterization in the past, demonstrating nonobstructive coronary disease. The patient denies syncope. In the interim he has done well.  He has reduced his tobacco use down to a half pack a day and has lost about 20 lbs. He denies sob. Allergies  Allergen Reactions  . Iodinated Diagnostic Agents Anaphylaxis  . Penicillins Anaphylaxis  . Shellfish-Derived Products Anaphylaxis  . Moxifloxacin     REACTION: GI upset, throat "tightened up"  . Latex Rash    Over long periods of time     Current Outpatient Prescriptions  Medication Sig Dispense Refill  . albuterol (PROVENTIL) (2.5 MG/3ML) 0.083% nebulizer solution Take 2.5 mg by nebulization every 6 (six) hours as needed for wheezing or shortness of breath.     Marland Kitchen albuterol-ipratropium (COMBIVENT) 18-103 MCG/ACT inhaler Inhale 2 puffs into the lungs every 6 (six) hours as needed for wheezing.     Marland Kitchen ALPRAZolam (XANAX) 0.5 MG tablet Take 0.5 mg by mouth daily as needed for anxiety.     Marland Kitchen aspirin 81 MG tablet Take 81 mg by mouth daily.    Marland Kitchen atorvastatin (LIPITOR) 40 MG tablet Take 1 tablet (40 mg total) by mouth daily. 30 tablet 0  . gabapentin (NEURONTIN) 300 MG capsule Take 1 tablet by mouth in the morning and take 2 tablets by mouth at night    . insulin aspart (NOVOLOG) 100 UNIT/ML injection Injection depends on SS    . metFORMIN (GLUCOPHAGE) 1000 MG tablet Take 1 tablet (1,000 mg total) by mouth 2 (two) times daily with a meal. 60 tablet 0  . naproxen sodium (ANAPROX) 220 MG tablet Take 220 mg by mouth 2 (two) times daily with a meal.    . oxyCODONE-acetaminophen (PERCOCET/ROXICET) 5-325 MG tablet Take 1-2 tablets by mouth 2 (two) times daily.    Marland Kitchen lisinopril-hydrochlorothiazide (ZESTORETIC)  10-12.5 MG per tablet Take 1 tablet by mouth daily.     No current facility-administered medications for this visit.      Past Medical History:  Diagnosis Date  . Coronary atherosclerosis of native coronary artery   . Hypogonadism male   . Obstructive chronic bronchitis with exacerbation ([place])   . Obstructive sleep apnea (adult) (pediatric)   . Other and unspecified hyperlipidemia   . Other emphysema ([place])   . Shortness of breath   . Type II or unspecified type diabetes mellitus without mention of complication, not stated as uncontrolled   . Unspecified essential hypertension     ROS:   All systems reviewed and negative except as noted in the HPI.   Past Surgical History:  Procedure Laterality Date  . L lens implant    . R knee replacement x2    . TONSILLECTOMY AND ADENOIDECTOMY    . vascectomy       Family History  Problem Relation Age of Onset  . Emphysema Father   . Heart disease Father   . Clotting disorder Mother      Social History   Social History  . Marital status: Married    Spouse name: N/A  . Number of children: N/A  . Years of education: N/A   Occupational History  . legal Therapist, nutritional    Social History Main Topics  . Smoking status: Current Every Day Smoker    Packs/day: 0.30  Types: Cigarettes  . Smokeless tobacco: Never Used  . Alcohol use Not on file  . Drug use: Unknown  . Sexual activity: Not on file   Other Topics Concern  . Not on file   Social History Narrative  . No narrative on file     BP 110/70   Pulse 92   Ht 5\' 7"  (1.702 m)   Wt 237 lb 3.2 oz (107.6 kg)   SpO2 96%   BMI 37.15 kg/m   Physical Exam:  obese appearing 70 year old man, NAD HEENT: Unremarkable Neck:  7 cm JVD, no thyromegally, no obvious bruit Back:  No CVA tenderness Lungs:  Clear with no wheezes, rales, or rhonchi.  HEART:  Regular rate rhythm, no murmurs, no rubs, no clicks Abd:  soft, obese, positive bowel sounds, no  organomegally, no rebound, no guarding Ext:  2 plus pulses, no edema, no cyanosis, no clubbing Skin:  No rashes no nodules Neuro:  CN II through XII intact, motor grossly intact  EKG Normal sinus rhythm with right axis deviation.  Assess/Plan: 1. HTN heart disease- his blood pressure is well controlled. Will follow. 2. Dyslipidemia - he needs to have his lipids rechecked  LFTS' and TSH. 3. CAD - he denies anginal symptoms.  4. Tobacco abuse - we discussed the importance of smoking cessation. He states he will try and stop.  Mikle Bosworth.D.

## 2016-11-26 ENCOUNTER — Ambulatory Visit: Payer: Medicare Other | Admitting: Podiatry

## 2016-12-12 ENCOUNTER — Ambulatory Visit (INDEPENDENT_AMBULATORY_CARE_PROVIDER_SITE_OTHER): Payer: Medicare Other | Admitting: Podiatry

## 2016-12-12 ENCOUNTER — Encounter: Payer: Self-pay | Admitting: Podiatry

## 2016-12-12 DIAGNOSIS — L608 Other nail disorders: Secondary | ICD-10-CM

## 2016-12-12 DIAGNOSIS — M79609 Pain in unspecified limb: Secondary | ICD-10-CM

## 2016-12-12 DIAGNOSIS — B351 Tinea unguium: Secondary | ICD-10-CM | POA: Diagnosis not present

## 2016-12-12 DIAGNOSIS — L603 Nail dystrophy: Secondary | ICD-10-CM | POA: Diagnosis not present

## 2016-12-12 DIAGNOSIS — E0843 Diabetes mellitus due to underlying condition with diabetic autonomic (poly)neuropathy: Secondary | ICD-10-CM | POA: Diagnosis not present

## 2016-12-15 NOTE — Progress Notes (Signed)
   SUBJECTIVE Patient with a history of diabetes mellitus presents to office today complaining of elongated, thickened nails. Pain while ambulating in shoes. Patient is unable to trim their own nails.   OBJECTIVE General Patient is awake, alert, and oriented x 3 and in no acute distress. Derm Skin is dry and supple bilateral. Negative open lesions or macerations. Remaining integument unremarkable. Nails are tender, long, thickened and dystrophic with subungual debris, consistent with onychomycosis, 1-5 bilateral. No signs of infection noted. Vasc  DP and PT pedal pulses palpable bilaterally. Temperature gradient within normal limits.  Neuro Epicritic and protective threshold sensation diminished bilaterally.  Musculoskeletal Exam No symptomatic pedal deformities noted bilateral. Muscular strength within normal limits.  ASSESSMENT 1. Diabetes Mellitus w/ peripheral neuropathy 2. Onychomycosis of nail due to dermatophyte bilateral 3. Pain in foot bilateral  PLAN OF CARE 1. Patient evaluated today. 2. Instructed to maintain good pedal hygiene and foot care. Stressed importance of controlling blood sugar.  3. Mechanical debridement of nails 1-5 bilaterally performed using a nail nipper. Filed with dremel without incident.  4. Return to clinic in 3 mos.     Brent M. Evans, DPM [facility]  Dr. Brent M. Evans, DPM    [address]                                        [place], [place] [address]                Office [phone]  Fax [phone]       

## 2017-08-20 ENCOUNTER — Encounter: Payer: Self-pay | Admitting: Internal Medicine

## 2017-08-20 ENCOUNTER — Ambulatory Visit: Payer: Medicare Other | Admitting: Internal Medicine

## 2017-08-20 VITALS — BP 136/92 | HR 90 | Ht 67.0 in | Wt 226.0 lb

## 2017-08-20 DIAGNOSIS — E785 Hyperlipidemia, unspecified: Secondary | ICD-10-CM

## 2017-08-20 DIAGNOSIS — I2510 Atherosclerotic heart disease of native coronary artery without angina pectoris: Secondary | ICD-10-CM | POA: Diagnosis not present

## 2017-08-20 DIAGNOSIS — I10 Essential (primary) hypertension: Secondary | ICD-10-CM | POA: Diagnosis not present

## 2017-08-20 MED ORDER — NITROGLYCERIN 0.4 MG SL SUBL: 0.4000 mg | SUBLINGUAL_TABLET | SUBLINGUAL | 3 refills | Status: DC | PRN

## 2017-08-20 NOTE — Patient Instructions (Signed)

## 2017-08-20 NOTE — Progress Notes (Signed)
HPI Justin Trujillo returns today for ongoing evaluation of coronary artery disease, hypertension, dyslipidemia, and ongoing tobacco abuse.  I saw him last a year ago.  In the interim, he has done well with no exertional chest pain. He is still smoking 3/4 pack a day. He has non-exertional chest pain occaisionally. He has gained over 10 lbs in the past month or two.  Allergies  Allergen Reactions  . Iodinated Diagnostic Agents Anaphylaxis  . Penicillins Anaphylaxis  . Shellfish-Derived Products Anaphylaxis  . Moxifloxacin     REACTION: GI upset, throat "tightened up"  . Latex Rash    Over long periods of time     Current Outpatient Medications  Medication Sig Dispense Refill  . ALPRAZolam (XANAX) 0.5 MG tablet Take 0.5 mg by mouth daily as needed for anxiety.     Marland Kitchen aspirin 81 MG tablet Take 81 mg by mouth daily.    Marland Kitchen atorvastatin (LIPITOR) 40 MG tablet Take 1 tablet (40 mg total) by mouth daily. 30 tablet 0  . gabapentin (NEURONTIN) 300 MG capsule Take 300 mg by mouth 2 (two) times daily.     . insulin aspart (NOVOLOG) 100 UNIT/ML injection Injection depends on SS    . lisinopril-hydrochlorothiazide (ZESTORETIC) 10-12.5 MG per tablet Take 1 tablet by mouth daily.    . metFORMIN (GLUCOPHAGE) 1000 MG tablet Take 1 tablet (1,000 mg total) by mouth 2 (two) times daily with a meal. 60 tablet 0  . naproxen sodium (ANAPROX) 220 MG tablet Take 220 mg by mouth 2 (two) times daily with a meal.    . oxyCODONE-acetaminophen (PERCOCET/ROXICET) 5-325 MG tablet Take 1-2 tablets by mouth 3 (three) times daily.     Marland Kitchen testosterone cypionate (DEPOTESTOSTERONE CYPIONATE) 200 MG/ML injection as directed.     Marland Kitchen tiZANidine (ZANAFLEX) 4 MG tablet as directed.  0  . nitroGLYCERIN (NITROSTAT) 0.4 MG SL tablet Place 1 tablet (0.4 mg total) under the tongue every 5 (five) minutes as needed for chest pain. 30 tablet 3   No current facility-administered medications for this visit.      Past Medical History:    Diagnosis Date  . Coronary atherosclerosis of native coronary artery   . Hypogonadism male   . Obstructive chronic bronchitis with exacerbation ([place])   . Obstructive sleep apnea (adult) (pediatric)   . Other and unspecified hyperlipidemia   . Other emphysema ([place])   . Shortness of breath   . Type II or unspecified type diabetes mellitus without mention of complication, not stated as uncontrolled   . Unspecified essential hypertension     ROS:   All systems reviewed and negative except as noted in the HPI.   Past Surgical History:  Procedure Laterality Date  . L lens implant    . R knee replacement x2    . TONSILLECTOMY AND ADENOIDECTOMY    . vascectomy       Family History  Problem Relation Age of Onset  . Emphysema Father   . Heart disease Father   . Clotting disorder Mother      Social History   Socioeconomic History  . Marital status: Married    Spouse name: Not on file  . Number of children: Not on file  . Years of education: Not on file  . Highest education level: Not on file  Social Needs  . Financial resource strain: Not on file  . Food insecurity - worry: Not on file  . Food insecurity - inability: Not  on file  . Transportation needs - medical: Not on file  . Transportation needs - non-medical: Not on file  Occupational History  . Occupation: Landscape architect  Tobacco Use  . Smoking status: Current Every Day Smoker    Packs/day: 0.30    Types: Cigarettes  . Smokeless tobacco: Never Used  Substance and Sexual Activity  . Alcohol use: Not on file  . Drug use: Not on file  . Sexual activity: Not on file  Other Topics Concern  . Not on file  Social History Narrative  . Not on file     BP (!) 136/92   Pulse 90   Ht 5\' 7"  (1.702 m)   Wt 226 lb (102.5 kg)   SpO2 97%   BMI 35.40 kg/m   Physical Exam:  Well appearing obese 71 year old man, NAD HEENT: Unremarkable Neck: 6 cm JVD, no thyromegally Lymphatics:  No  adenopathy Back:  No CVA tenderness Lungs:  Clear, with no wheezes, rales, or rhonchi HEART:  Regular rate rhythm, no murmurs, no rubs, no clicks Abd:  soft, positive bowel sounds, no organomegally, no rebound, no guarding Ext:  2 plus pulses, no edema, no cyanosis, no clubbing Skin:  No rashes no nodules Neuro:  CN II through XII intact, motor grossly intact  ECG -sinus rhythm with right axis deviation  Assess/Plan: 1.  Coronary artery disease -he denies anginal symptoms.  He does have nonexertional chest pain which is very minimal.  He is encouraged to lose weight, increase his physical activity, and stop smoking 2.  Tobacco abuse -he is down to less than a pack a day.  I have strongly encouraged the patient to stop smoking. 3.  Obesity -we discussed ways to lose weight.  He admits to dietary indiscretion. 4.  Hypertension -his blood pressure is elevated today.  The patient checks his blood pressure at home and notes that it is almost never over 130.  I encouraged the patient to lose weight and to reduce his salt intake.  Justin Sickles, MD

## 2017-09-17 DIAGNOSIS — R809 Proteinuria, unspecified: Secondary | ICD-10-CM

## 2017-09-17 HISTORY — DX: Proteinuria, unspecified: R80.9

## 2017-10-01 DIAGNOSIS — M5136 Other intervertebral disc degeneration, lumbar region: Secondary | ICD-10-CM | POA: Diagnosis not present

## 2017-10-01 DIAGNOSIS — M48061 Spinal stenosis, lumbar region without neurogenic claudication: Secondary | ICD-10-CM | POA: Diagnosis not present

## 2017-10-01 DIAGNOSIS — Z79891 Long term (current) use of opiate analgesic: Secondary | ICD-10-CM | POA: Diagnosis not present

## 2017-10-15 DIAGNOSIS — M5416 Radiculopathy, lumbar region: Secondary | ICD-10-CM | POA: Diagnosis not present

## 2017-11-07 DIAGNOSIS — N5201 Erectile dysfunction due to arterial insufficiency: Secondary | ICD-10-CM | POA: Diagnosis not present

## 2017-11-07 DIAGNOSIS — E291 Testicular hypofunction: Secondary | ICD-10-CM | POA: Diagnosis not present

## 2017-12-26 DIAGNOSIS — G894 Chronic pain syndrome: Secondary | ICD-10-CM | POA: Diagnosis not present

## 2018-01-09 DIAGNOSIS — E785 Hyperlipidemia, unspecified: Secondary | ICD-10-CM | POA: Diagnosis not present

## 2018-01-09 DIAGNOSIS — N182 Chronic kidney disease, stage 2 (mild): Secondary | ICD-10-CM | POA: Diagnosis not present

## 2018-01-09 DIAGNOSIS — Z720 Tobacco use: Secondary | ICD-10-CM | POA: Diagnosis not present

## 2018-01-09 DIAGNOSIS — R809 Proteinuria, unspecified: Secondary | ICD-10-CM | POA: Diagnosis not present

## 2018-01-09 DIAGNOSIS — E1122 Type 2 diabetes mellitus with diabetic chronic kidney disease: Secondary | ICD-10-CM | POA: Diagnosis not present

## 2018-01-09 DIAGNOSIS — I129 Hypertensive chronic kidney disease with stage 1 through stage 4 chronic kidney disease, or unspecified chronic kidney disease: Secondary | ICD-10-CM | POA: Diagnosis not present

## 2018-01-21 DIAGNOSIS — Z96651 Presence of right artificial knee joint: Secondary | ICD-10-CM | POA: Diagnosis not present

## 2018-01-21 DIAGNOSIS — Z471 Aftercare following joint replacement surgery: Secondary | ICD-10-CM | POA: Diagnosis not present

## 2018-01-21 DIAGNOSIS — M1711 Unilateral primary osteoarthritis, right knee: Secondary | ICD-10-CM | POA: Diagnosis not present

## 2018-01-23 DIAGNOSIS — M5416 Radiculopathy, lumbar region: Secondary | ICD-10-CM | POA: Diagnosis not present

## 2018-02-12 DIAGNOSIS — M48061 Spinal stenosis, lumbar region without neurogenic claudication: Secondary | ICD-10-CM | POA: Diagnosis not present

## 2018-02-12 DIAGNOSIS — Z79899 Other long term (current) drug therapy: Secondary | ICD-10-CM | POA: Diagnosis not present

## 2018-02-12 DIAGNOSIS — M545 Low back pain: Secondary | ICD-10-CM | POA: Diagnosis not present

## 2018-02-12 DIAGNOSIS — Z79891 Long term (current) use of opiate analgesic: Secondary | ICD-10-CM | POA: Diagnosis not present

## 2018-02-12 DIAGNOSIS — M5136 Other intervertebral disc degeneration, lumbar region: Secondary | ICD-10-CM | POA: Diagnosis not present

## 2018-02-12 DIAGNOSIS — M5416 Radiculopathy, lumbar region: Secondary | ICD-10-CM | POA: Diagnosis not present

## 2018-02-25 ENCOUNTER — Telehealth: Payer: Self-pay | Admitting: *Deleted

## 2018-02-25 NOTE — Telephone Encounter (Signed)
   [facility] Pre-operative Risk Assessment    Request for surgical clearance: TKA revision  1. What type of surgery is being performed? Right TKA revision  2. When is this surgery scheduled?  05/21/18  3. What type of clearance is required (medical clearance vs. Pharmacy clearance to hold med vs. Both)?  Medical clearance.  4. Are there any medications that need to be held prior to surgery and how long? none  5. Practice name and name of physician performing surgery? Dr. Wynelle Link  6. What is your office phone number [phone]    7.   What is your office fax number [phone] ATTN: Fabio Asa. 8.   Anesthesia type (None, local, MAC, general) ? None specified.   Carollee Sires 04/02/2018, 10:53 AM  _________________________________________________________________   (provider comments below)  See  Dr. Lovena Le note . "Yes. He may proceed with Surgery'.

## 2018-02-26 NOTE — Telephone Encounter (Signed)
Spoke with pt re: surgical clearance. Pt has been scheduled for Donnajean Lopes, PA-C 03/28/18.

## 2018-02-26 NOTE — Telephone Encounter (Signed)
   Primary Cardiologist:Gregg Lovena Le, MD  Chart reviewed as part of pre-operative protocol coverage. Because of Justin Trujillo's past medical history and time since last visit, he/she will require a follow-up visit in order to better assess preoperative cardiovascular risk.  Pre-op covering staff: - Please schedule appointment and call patient to inform them. - Please contact requesting surgeon's office via preferred method (i.e, phone, fax) to inform them of need for appointment prior to surgery.  Lyda Jester, PA-C  02/26/2018, 3:30 PM

## 2018-03-07 ENCOUNTER — Encounter: Payer: Self-pay | Admitting: Internal Medicine

## 2018-03-07 ENCOUNTER — Ambulatory Visit: Payer: PPO | Admitting: Internal Medicine

## 2018-03-07 VITALS — BP 124/74 | HR 85 | Ht 65.0 in | Wt 229.0 lb

## 2018-03-07 DIAGNOSIS — I10 Essential (primary) hypertension: Secondary | ICD-10-CM

## 2018-03-07 DIAGNOSIS — E785 Hyperlipidemia, unspecified: Secondary | ICD-10-CM | POA: Diagnosis not present

## 2018-03-07 DIAGNOSIS — I2510 Atherosclerotic heart disease of native coronary artery without angina pectoris: Secondary | ICD-10-CM

## 2018-03-07 NOTE — Patient Instructions (Signed)
Medication Instructions:  Your physician recommends that you continue on your current medications as directed. Please refer to the Current Medication list given to you today.  Labwork: None ordered.  Testing/Procedures: Your physician has requested that you have a lexiscan myoview. For further information please visit [email or web address]. Please follow instruction sheet, as given.  Please schedule for lexiscan myoview  Follow-Up: Your physician wants you to follow-up in: one year with Dr. Taylor.   You will receive a reminder letter in the mail two months in advance. If you don't receive a letter, please call our office to schedule the follow-up appointment.   Any Other Special Instructions Will Be Listed Below (If Applicable).  If you need a refill on your cardiac medications before your next appointment, please call your pharmacy.   

## 2018-03-07 NOTE — Progress Notes (Signed)
HPI Mr. Ohms returns today for followup. He is a 72 year old man with multiple cardiac risk factors, hypertension, diabetes, dyslipidemia, and ongoing tobacco abuse. The patient has chest tightness in the past which occurs with emotional stress. He has undergone catheterization in the past, demonstrating nonobstructive coronary disease. The patient has developed worsening pain in his knee and is pending knee surgery. He is very sedentary. He does not walk any significant distance. He is very sedentary.   Allergies  Allergen Reactions  . Iodinated Diagnostic Agents Anaphylaxis  . Penicillins Anaphylaxis  . Shellfish-Derived Products Anaphylaxis  . Moxifloxacin     REACTION: GI upset, throat "tightened up"  . Latex Rash    Over long periods of time     Current Outpatient Medications  Medication Sig Dispense Refill  . ALPRAZolam (XANAX) 0.5 MG tablet Take 0.5 mg by mouth daily as needed for anxiety.     Marland Kitchen aspirin 81 MG tablet Take 81 mg by mouth daily.    Marland Kitchen atorvastatin (LIPITOR) 40 MG tablet Take 1 tablet (40 mg total) by mouth daily. 30 tablet 0  . gabapentin (NEURONTIN) 300 MG capsule Take 300 mg by mouth 2 (two) times daily.     . insulin aspart (NOVOLOG) 100 UNIT/ML injection Injection depends on SS    . metFORMIN (GLUCOPHAGE) 1000 MG tablet Take 1 tablet (1,000 mg total) by mouth 2 (two) times daily with a meal. 60 tablet 0  . oxyCODONE-acetaminophen (PERCOCET/ROXICET) 5-325 MG tablet Take 1-2 tablets by mouth 3 (three) times daily.     Marland Kitchen testosterone cypionate (DEPOTESTOSTERONE CYPIONATE) 200 MG/ML injection as directed.     Marland Kitchen tiZANidine (ZANAFLEX) 4 MG tablet as directed.  0  . lisinopril-hydrochlorothiazide (ZESTORETIC) 10-12.5 MG per tablet Take 1 tablet by mouth daily.     No current facility-administered medications for this visit.      Past Medical History:  Diagnosis Date  . Coronary atherosclerosis of native coronary artery   . Hypogonadism male   .  Obstructive chronic bronchitis with exacerbation ([place])   . Obstructive sleep apnea (adult) (pediatric)   . Other and unspecified hyperlipidemia   . Other emphysema ([place])   . Shortness of breath   . Type II or unspecified type diabetes mellitus without mention of complication, not stated as uncontrolled   . Unspecified essential hypertension     ROS:   All systems reviewed and negative except as noted in the HPI.   Past Surgical History:  Procedure Laterality Date  . L lens implant    . R knee replacement x2    . TONSILLECTOMY AND ADENOIDECTOMY    . vascectomy       Family History  Problem Relation Age of Onset  . Emphysema Father   . Heart disease Father   . Clotting disorder Mother      Social History   Socioeconomic History  . Marital status: Married    Spouse name: Not on file  . Number of children: Not on file  . Years of education: Not on file  . Highest education level: Not on file  Occupational History  . Occupation: Landscape architect  Social Needs  . Financial resource strain: Not on file  . Food insecurity:    Worry: Not on file    Inability: Not on file  . Transportation needs:    Medical: Not on file    Non-medical: Not on file  Tobacco Use  . Smoking status: Current Every Day  Smoker    Packs/day: 0.30    Types: Cigarettes  . Smokeless tobacco: Never Used  Substance and Sexual Activity  . Alcohol use: Not on file  . Drug use: Not on file  . Sexual activity: Not on file  Lifestyle  . Physical activity:    Days per week: Not on file    Minutes per session: Not on file  . Stress: Not on file  Relationships  . Social connections:    Talks on phone: Not on file    Gets together: Not on file    Attends religious service: Not on file    Active member of club or organization: Not on file    Attends meetings of clubs or organizations: Not on file    Relationship status: Not on file  . Intimate partner violence:    Fear of current  or ex partner: Not on file    Emotionally abused: Not on file    Physically abused: Not on file    Forced sexual activity: Not on file  Other Topics Concern  . Not on file  Social History Narrative  . Not on file     BP 124/74   Pulse 85   Ht 5\' 5"  (1.651 m)   Wt 229 lb (103.9 kg)   SpO2 96%   BMI 38.11 kg/m   Physical Exam:  Well appearing 72 yo man, overweight but in NAD HEENT: Unremarkable Neck:  6 cm JVD, no thyromegally Lymphatics:  No adenopathy Back:  No CVA tenderness Lungs:  Clear with no wheezes HEART:  Regular rate rhythm, no murmurs, no rubs, no clicks Abd:  soft, positive bowel sounds, no organomegally, no rebound, no guarding Ext:  2 plus pulses, no edema, no cyanosis, no clubbing Skin:  No rashes no nodules Neuro:  CN II through XII intact, motor grossly intact  EKG - NSR with right axis   Assess/Plan: 1. Preoperative evaluation - the patient is moderate risk for major cardiac complications. We cannot assess his level of functional abilty because of his bad knee. He will undergo lexiscan myoview for preoperative risk stratification in this very sedentary individual.  2. Obesity - he has lost weight but remains morbidly obese.  3. CAD - he has known disease with a low risk stress test 5 years ago. He will repeat his lexiscan. 4. HTN - his blood pressure is well controlled. No change in meds.  Mikle Bosworth.D.

## 2018-03-12 ENCOUNTER — Telehealth (HOSPITAL_COMMUNITY): Payer: Self-pay | Admitting: *Deleted

## 2018-03-12 NOTE — Telephone Encounter (Signed)
Patient given detailed instructions per Myocardial Perfusion Study Information Sheet for the test on 03/18/18. Patient notified to arrive 15 minutes early and that it is imperative to arrive on time for appointment to keep from having the test rescheduled.  If you need to cancel or reschedule your appointment, please call the office within 24 hours of your appointment. . Patient verbalized understanding. Jericho Cieslik Jacqueline    

## 2018-03-18 ENCOUNTER — Ambulatory Visit (HOSPITAL_COMMUNITY): Payer: PPO | Attending: Internal Medicine

## 2018-03-18 DIAGNOSIS — R9439 Abnormal result of other cardiovascular function study: Secondary | ICD-10-CM | POA: Insufficient documentation

## 2018-03-18 DIAGNOSIS — Z794 Long term (current) use of insulin: Secondary | ICD-10-CM | POA: Diagnosis not present

## 2018-03-18 DIAGNOSIS — I10 Essential (primary) hypertension: Secondary | ICD-10-CM | POA: Diagnosis not present

## 2018-03-18 DIAGNOSIS — E785 Hyperlipidemia, unspecified: Secondary | ICD-10-CM | POA: Diagnosis not present

## 2018-03-18 DIAGNOSIS — Z01818 Encounter for other preprocedural examination: Secondary | ICD-10-CM | POA: Insufficient documentation

## 2018-03-18 DIAGNOSIS — I2510 Atherosclerotic heart disease of native coronary artery without angina pectoris: Secondary | ICD-10-CM | POA: Diagnosis not present

## 2018-03-18 DIAGNOSIS — Z6838 Body mass index (BMI) 38.0-38.9, adult: Secondary | ICD-10-CM | POA: Insufficient documentation

## 2018-03-18 DIAGNOSIS — E119 Type 2 diabetes mellitus without complications: Secondary | ICD-10-CM | POA: Insufficient documentation

## 2018-03-18 LAB — MYOCARDIAL PERFUSION IMAGING
CHL CUP NUCLEAR SDS: 2
CSEPPHR: 100 {beats}/min
LVDIAVOL: 104 mL (ref 62–150)
LVSYSVOL: 61 mL
RATE: 0.34
Rest HR: 85 {beats}/min
SRS: 3
SSS: 5
TID: 1.16

## 2018-03-18 MED ORDER — TECHNETIUM TC 99M TETROFOSMIN IV KIT
9.0000 | PACK | Freq: Once | INTRAVENOUS | Status: AC | PRN
  Administered 2018-03-18: 9 via INTRAVENOUS
  Filled 2018-03-18: qty 9

## 2018-03-18 MED ORDER — TECHNETIUM TC 99M TETROFOSMIN IV KIT
30.6000 | PACK | Freq: Once | INTRAVENOUS | Status: AC | PRN
  Administered 2018-03-18: 30.6 via INTRAVENOUS
  Filled 2018-03-18: qty 31

## 2018-03-18 MED ORDER — REGADENOSON 0.4 MG/5ML IV SOLN
0.4000 mg | Freq: Once | INTRAVENOUS | Status: AC
  Administered 2018-03-18: 0.4 mg via INTRAVENOUS

## 2018-03-25 DIAGNOSIS — Z79899 Other long term (current) drug therapy: Secondary | ICD-10-CM | POA: Diagnosis not present

## 2018-03-25 DIAGNOSIS — M5136 Other intervertebral disc degeneration, lumbar region: Secondary | ICD-10-CM | POA: Diagnosis not present

## 2018-03-25 DIAGNOSIS — Z79891 Long term (current) use of opiate analgesic: Secondary | ICD-10-CM | POA: Diagnosis not present

## 2018-03-25 DIAGNOSIS — M5416 Radiculopathy, lumbar region: Secondary | ICD-10-CM | POA: Diagnosis not present

## 2018-03-25 DIAGNOSIS — M545 Low back pain: Secondary | ICD-10-CM | POA: Diagnosis not present

## 2018-03-25 DIAGNOSIS — M48061 Spinal stenosis, lumbar region without neurogenic claudication: Secondary | ICD-10-CM | POA: Diagnosis not present

## 2018-03-26 DIAGNOSIS — M62838 Other muscle spasm: Secondary | ICD-10-CM | POA: Diagnosis not present

## 2018-03-26 DIAGNOSIS — I10 Essential (primary) hypertension: Secondary | ICD-10-CM | POA: Diagnosis not present

## 2018-03-26 DIAGNOSIS — T148XXA Other injury of unspecified body region, initial encounter: Secondary | ICD-10-CM | POA: Diagnosis not present

## 2018-03-26 DIAGNOSIS — G4700 Insomnia, unspecified: Secondary | ICD-10-CM | POA: Diagnosis not present

## 2018-03-26 DIAGNOSIS — E785 Hyperlipidemia, unspecified: Secondary | ICD-10-CM | POA: Diagnosis not present

## 2018-03-26 DIAGNOSIS — E1121 Type 2 diabetes mellitus with diabetic nephropathy: Secondary | ICD-10-CM | POA: Diagnosis not present

## 2018-03-26 DIAGNOSIS — Z6835 Body mass index (BMI) 35.0-35.9, adult: Secondary | ICD-10-CM | POA: Diagnosis not present

## 2018-03-26 DIAGNOSIS — Z794 Long term (current) use of insulin: Secondary | ICD-10-CM | POA: Diagnosis not present

## 2018-03-28 ENCOUNTER — Ambulatory Visit: Payer: Medicare Other | Admitting: Physician Assistant

## 2018-04-02 ENCOUNTER — Encounter: Payer: Self-pay | Admitting: *Deleted

## 2018-04-02 ENCOUNTER — Telehealth: Payer: Self-pay

## 2018-04-02 NOTE — Telephone Encounter (Signed)
error 

## 2018-04-02 NOTE — Telephone Encounter (Signed)
Pt completed a stress test for work up for surgical clearance.    Per Dr. Acquanetta Sit, Champ Mungo, MD sent to Damian Leavell, RN        Yes. He may proceed with surgery. GT    I will forward his office note and his stress test results.

## 2018-04-10 DIAGNOSIS — M5416 Radiculopathy, lumbar region: Secondary | ICD-10-CM | POA: Diagnosis not present

## 2018-04-10 DIAGNOSIS — Z79899 Other long term (current) drug therapy: Secondary | ICD-10-CM | POA: Diagnosis not present

## 2018-04-21 ENCOUNTER — Telehealth: Payer: Self-pay | Admitting: *Deleted

## 2018-04-21 ENCOUNTER — Telehealth: Payer: Self-pay | Admitting: Internal Medicine

## 2018-04-21 NOTE — Telephone Encounter (Signed)
Walk In pt Form-Surgical Clearance dropped off. Placed in Triage box to be addressed.

## 2018-04-21 NOTE — H&P (Signed)
TOTAL KNEE REVISION ADMISSION H&P  Patient is being admitted for right knee polyethylene versus total knee arthroplasty revision.  Subjective:  Chief Complaint:right knee pain.  HPI: Justin Trujillo, 72 y.o. male, has a history of pain and functional disability in the right knee(s) due to failed previous arthroplasty and patient has failed non-surgical conservative treatments for greater than 12 weeks to include NSAID's and/or analgesics, use of assistive devices and activity modification. The indications for the revision of the total knee arthroplasty are instability in the right knee. Onset of symptoms was abrupt starting 1 year ago with stable course since that time.  Prior procedures on the right knee(s) include arthroplasty.  Patient currently rates pain in the right knee(s) at 3 out of 10 with activity. There is pain that interferes with activities of daily living and instability.  Patient has evidence of prosthesis in good position with no signs of loosening by imaging studies. This condition presents safety issues increasing the risk of falls. There is no current active infection.  Patient Active Problem List   Diagnosis Date Noted  . DM type 2 (diabetes mellitus, type 2) ([place]) 01/15/2012  . CAD (coronary artery disease) 01/15/2012  . HTN (hypertension) 01/15/2012  . Dyslipidemia 01/15/2012  . COPD (chronic obstructive pulmonary disease); chronic bronchitis 01/15/2012  . Tobacco abuse 01/15/2012  . BMI 40.0-44.9, adult ([place]) 01/15/2012  . OSA (obstructive sleep apnea) 01/15/2012  . Insomnia 01/15/2012  . IBS (irritable bowel syndrome) 01/15/2012  . Hypogonadism male 01/15/2012  . DJD (degenerative joint disease) 01/15/2012  . Rotator cuff arthropathy, left 01/15/2012  . GERD (gastroesophageal reflux disease) 01/15/2012  . CHEST PAIN, ATYPICAL 07/12/2010  . TOBACCO USER 10/20/2009  . CAD, NATIVE VESSEL 09/29/2009  . EMPHYSEMA 09/21/2009  . DM 08/31/2009  . HYPERLIPIDEMIA  08/31/2009  . OBSTRUCTIVE SLEEP APNEA 08/31/2009  . Essential hypertension 08/31/2009  . CHRONIC OBSTRUCTIVE PULMONARY DISEASE, ACUTE EXACERBATION 08/31/2009  . DYSPNEA 08/31/2009   Past Medical History:  Diagnosis Date  . Coronary atherosclerosis of native coronary artery   . Hypogonadism male   . Obstructive chronic bronchitis with exacerbation ([place])   . Obstructive sleep apnea (adult) (pediatric)   . Other and unspecified hyperlipidemia   . Other emphysema ([place])   . Shortness of breath   . Type II or unspecified type diabetes mellitus without mention of complication, not stated as uncontrolled   . Unspecified essential hypertension     Past Surgical History:  Procedure Laterality Date  . L lens implant    . R knee replacement x2    . TONSILLECTOMY AND ADENOIDECTOMY    . vascectomy      No current facility-administered medications for this encounter.    Current Outpatient Medications  Medication Sig Dispense Refill Last Dose  . ALPRAZolam (XANAX) 0.5 MG tablet Take 0.5 mg by mouth daily as needed for anxiety.    Taking  . aspirin 81 MG tablet Take 81 mg by mouth daily.   Taking  . atorvastatin (LIPITOR) 40 MG tablet Take 1 tablet (40 mg total) by mouth daily. 30 tablet 0 Taking  . gabapentin (NEURONTIN) 300 MG capsule Take 300 mg by mouth 2 (two) times daily.    Taking  . insulin aspart (NOVOLOG) 100 UNIT/ML injection Injection depends on SS   Taking  . lisinopril-hydrochlorothiazide (ZESTORETIC) 10-12.5 MG per tablet Take 1 tablet by mouth daily.   Taking  . metFORMIN (GLUCOPHAGE) 1000 MG tablet Take 1 tablet (1,000 mg total) by mouth 2 (two)  times daily with a meal. 60 tablet 0 Taking  . oxyCODONE-acetaminophen (PERCOCET/ROXICET) 5-325 MG tablet Take 1-2 tablets by mouth 3 (three) times daily.    Taking  . testosterone cypionate (DEPOTESTOSTERONE CYPIONATE) 200 MG/ML injection as directed.    Taking  . tiZANidine (ZANAFLEX) 4 MG tablet as directed.  0 Taking   Allergies    Allergen Reactions  . Iodinated Diagnostic Agents Anaphylaxis  . Penicillins Anaphylaxis  . Shellfish-Derived Products Anaphylaxis  . Moxifloxacin     REACTION: GI upset, throat "tightened up"  . Latex Rash    Over long periods of time    Social History   Tobacco Use  . Smoking status: Current Every Day Smoker    Packs/day: 0.30    Types: Cigarettes  . Smokeless tobacco: Never Used  Substance Use Topics  . Alcohol use: Not on file    Family History  Problem Relation Age of Onset  . Emphysema Father   . Heart disease Father   . Clotting disorder Mother       Review of Systems  Constitutional: Negative for chills and fever.  HENT: Negative for congestion, sore throat and tinnitus.   Eyes: Negative for double vision, photophobia and pain.  Respiratory: Negative for cough, shortness of breath and wheezing.   Cardiovascular: Negative for chest pain, palpitations and orthopnea.  Gastrointestinal: Negative for heartburn, nausea and vomiting.  Genitourinary: Negative for dysuria, frequency and urgency.  Musculoskeletal: Positive for joint pain.  Neurological: Negative for dizziness, weakness and headaches.  Psychiatric/Behavioral: Negative for depression.     Objective:  Physical Exam  Overweight and well developed. General: Alert and oriented x3, cooperative and pleasant, no acute distress. Head: normocephalic, atraumatic, neck supple. Eyes: EOMI. Respiratory: breath sounds clear in all fields, no wheezing, rales, or rhonchi. Cardiovascular: Regular rate and rhythm, no murmurs, gallops or rubs.  Abdomen: non-tender to palpation and soft, normoactive bowel sounds. Musculoskeletal: Right Knee Exam: Mild tenderness to palpation about the medial and lateral joint line of the right knee. ROM right knee 5-125 degrees. No effusion noted in the right knee but moderate soft tissue swelling. AP instability about the right knee. Incision healed with no signs of infection.  Normal  painless motion of the hips. Calves soft and nontender. Motor function intact in LE. Strength 5/5 LE bilaterally. Neuro: Distal pulses 2+. Sensation to light touch intact in LE.  Vital signs in last 24 hours: Blood pressure: 130/86 mmHg Pulse: 84 bpm  Labs:  Estimated body mass index is 38.11 kg/m as calculated from the following:   Height as of 03/18/18: 5\' 5"  (1.651 m).   Weight as of 03/18/18: 103.9 kg (229 lb).  Imaging Review Plain radiographs demonstrates prosthesis in good position with no signs of loosening.There is no evidence of loosening of the femoral, tibial or patellar components. The bone quality appears to be adequate for age and reported activity level.    Preoperative templating of the joint replacement has been completed, documented, and submitted to the Operating Room personnel in order to optimize intra-operative equipment management.   Assessment/Plan:  End stage arthritis, right knee(s) with failed previous arthroplasty.   The patient history, physical examination, clinical judgment of the provider and imaging studies are consistent with end stage degenerative joint disease of the right knee(s), previous total knee arthroplasty. Revision total knee arthroplasty is deemed medically necessary. The treatment options including medical management, injection therapy, arthroscopy and revision arthroplasty were discussed at length. The risks and benefits of revision total  knee arthroplasty were presented and reviewed. The risks due to aseptic loosening, infection, stiffness, patella tracking problems, thromboembolic complications and other imponderables were discussed. The patient acknowledged the explanation, agreed to proceed with the plan and consent was signed. Patient is being admitted for inpatient treatment for surgery, pain control, PT, OT, prophylactic antibiotics, VTE prophylaxis, progressive ambulation and ADL's and discharge planning.The patient is planning to be  discharged home with outpatient physical therapy.   Therapy Plans: outpatient physical therapy at [facility] Disposition: Home with wife Planned DVT Prophylaxis: Aspirin 325 mg BID DME needed: None PCP: Dr. Jeremy Johann Cardiologist: Dr. Cristopher Peru  TXA: IV Allergies: PCN (anaphylaxis) Other: Pt has recently been seen by Dr. Lovena Le but does not have cardiac clearance. Form provided to patient to take by office.   - Patient was instructed on what medications to stop prior to surgery. - Follow-up visit in 2 weeks with Dr. Wynelle Link - Begin physical therapy following surgery - Pre-operative lab work as pre-surgical testing - Prescriptions will be provided in hospital at time of discharge  Theresa Duty, PA-C Orthopedic Surgery [facility] [place]

## 2018-04-21 NOTE — Telephone Encounter (Signed)
   [facility] Pre-operative Risk Assessment    Request for surgical clearance:  1. What type of surgery is being performed? Right Polyethylene Revision vs. TKA revision   2. When is this surgery scheduled? 05/21/18   3. What type of clearance is required (medical clearance vs. Pharmacy clearance to hold med vs. Both)? Medical  4. Are there any medications that need to be held prior to surgery and how long? ASA 81 mg  5. Practice name and name of physician performing surgery? [facility]   6. What is your office phone number [phone]    7.   What is your office fax number [phone] Atn: Claiborne Billings [place]  8.   Anesthesia type (None, local, MAC, general) ? Choice   Janan Halter 04/21/2018, 12:46 PM  _________________________________________________________________   (provider comments below)

## 2018-04-22 DIAGNOSIS — H25011 Cortical age-related cataract, right eye: Secondary | ICD-10-CM | POA: Diagnosis not present

## 2018-04-22 DIAGNOSIS — H2511 Age-related nuclear cataract, right eye: Secondary | ICD-10-CM | POA: Diagnosis not present

## 2018-04-22 DIAGNOSIS — H02831 Dermatochalasis of right upper eyelid: Secondary | ICD-10-CM | POA: Diagnosis not present

## 2018-04-22 DIAGNOSIS — H25041 Posterior subcapsular polar age-related cataract, right eye: Secondary | ICD-10-CM | POA: Diagnosis not present

## 2018-04-22 DIAGNOSIS — H18413 Arcus senilis, bilateral: Secondary | ICD-10-CM | POA: Diagnosis not present

## 2018-04-23 NOTE — Telephone Encounter (Signed)
Dr Lovena Le to address when he returns from vacation early next week

## 2018-04-23 NOTE — Telephone Encounter (Signed)
Routing to Dr. Lovena Le for discussion of the recent Myoview - intermediate risk and if cleared - how long to hold aspirin.  Dr. Lovena Le,  Please address the above and route back to the CV DIV PREOP pool.   Burtis Junes, RN, [facility] [address] [place], [place]  [phone]

## 2018-05-01 NOTE — Telephone Encounter (Signed)
Per Dr. Lovena Le -  "May proceed.  May hold ASA 5 days"  Will send to preop pool.

## 2018-05-01 NOTE — Telephone Encounter (Signed)
   Primary Cardiologist: Cristopher Peru, MD  Chart reviewed as part of pre-operative protocol coverage. Given past medical history and time since last visit, based on ACC/AHA guidelines, Justin Trujillo would be at acceptable risk for the planned procedure without further cardiovascular testing. Per Dr. Lovena Le, he may proceed with surgery and may hold aspirin for 5 days prior to surgery.  I will route this recommendation to the requesting party via Epic fax function and remove from pre-op pool.  Please call with questions.  Tami Lin Aaran Enberg, PA 05/01/2018, 1:40 PM

## 2018-05-07 DIAGNOSIS — M48061 Spinal stenosis, lumbar region without neurogenic claudication: Secondary | ICD-10-CM | POA: Diagnosis not present

## 2018-05-07 DIAGNOSIS — Z79899 Other long term (current) drug therapy: Secondary | ICD-10-CM | POA: Diagnosis not present

## 2018-05-07 DIAGNOSIS — M5416 Radiculopathy, lumbar region: Secondary | ICD-10-CM | POA: Diagnosis not present

## 2018-05-13 DIAGNOSIS — M545 Low back pain: Secondary | ICD-10-CM | POA: Diagnosis not present

## 2018-05-15 ENCOUNTER — Other Ambulatory Visit (HOSPITAL_COMMUNITY): Payer: PPO

## 2018-05-16 DIAGNOSIS — M4186 Other forms of scoliosis, lumbar region: Secondary | ICD-10-CM | POA: Diagnosis not present

## 2018-05-16 DIAGNOSIS — M48061 Spinal stenosis, lumbar region without neurogenic claudication: Secondary | ICD-10-CM | POA: Diagnosis not present

## 2018-05-16 DIAGNOSIS — M545 Low back pain: Secondary | ICD-10-CM | POA: Diagnosis not present

## 2018-05-16 DIAGNOSIS — M4310 Spondylolisthesis, site unspecified: Secondary | ICD-10-CM | POA: Diagnosis not present

## 2018-05-21 ENCOUNTER — Inpatient Hospital Stay (HOSPITAL_COMMUNITY): Admission: RE | Admit: 2018-05-21 | Payer: PPO | Source: Ambulatory Visit | Admitting: Orthopedic Surgery

## 2018-05-21 ENCOUNTER — Encounter (HOSPITAL_COMMUNITY): Admission: RE | Payer: Self-pay | Source: Ambulatory Visit

## 2018-05-21 SURGERY — TOTAL KNEE REVISION
Anesthesia: Choice | Site: Knee | Laterality: Right

## 2018-05-26 ENCOUNTER — Other Ambulatory Visit: Payer: Self-pay | Admitting: Family Medicine

## 2018-05-26 ENCOUNTER — Ambulatory Visit: Payer: PPO

## 2018-05-26 DIAGNOSIS — S32009D Unspecified fracture of unspecified lumbar vertebra, subsequent encounter for fracture with routine healing: Secondary | ICD-10-CM

## 2018-05-27 ENCOUNTER — Telehealth (HOSPITAL_COMMUNITY): Payer: Self-pay

## 2018-05-27 NOTE — Telephone Encounter (Signed)
Called to schedule consult for L4 fracture, no answer, left vm. AW

## 2018-05-28 ENCOUNTER — Ambulatory Visit
Admission: RE | Admit: 2018-05-28 | Discharge: 2018-05-28 | Disposition: A | Discharge status: Home / Self Care | Payer: PPO | Source: Ambulatory Visit | Attending: Family Medicine | Admitting: Family Medicine

## 2018-05-28 DIAGNOSIS — S32009D Unspecified fracture of unspecified lumbar vertebra, subsequent encounter for fracture with routine healing: Secondary | ICD-10-CM

## 2018-05-28 DIAGNOSIS — M8589 Other specified disorders of bone density and structure, multiple sites: Secondary | ICD-10-CM | POA: Diagnosis not present

## 2018-05-30 DIAGNOSIS — S32000A Wedge compression fracture of unspecified lumbar vertebra, initial encounter for closed fracture: Secondary | ICD-10-CM | POA: Diagnosis not present

## 2018-06-02 ENCOUNTER — Other Ambulatory Visit (HOSPITAL_COMMUNITY): Payer: Self-pay | Admitting: Interventional Radiology

## 2018-06-02 DIAGNOSIS — S32040A Wedge compression fracture of fourth lumbar vertebra, initial encounter for closed fracture: Secondary | ICD-10-CM

## 2018-06-05 ENCOUNTER — Ambulatory Visit (HOSPITAL_COMMUNITY)
Admission: RE | Admit: 2018-06-05 | Discharge: 2018-06-05 | Disposition: A | Discharge status: Home / Self Care | Payer: PPO | Source: Ambulatory Visit | Attending: Interventional Radiology | Admitting: Interventional Radiology

## 2018-06-05 DIAGNOSIS — S32040A Wedge compression fracture of fourth lumbar vertebra, initial encounter for closed fracture: Secondary | ICD-10-CM | POA: Diagnosis not present

## 2018-06-05 NOTE — Consult Note (Addendum)
Chief Complaint: Patient was seen in consultation today for back pain, L4 compression fracture  Referring Physician(s): Dr. Nelva Bush  Supervising Physician: Luanne Bras  Patient Status: [facility] - Out-pt  History of Present Illness: Justin Trujillo is a 72 y.o. male with past medical history of CAD, HTN, COPD, tobacco abuse, DM 2, HLD, DJD, spinal stenosis presents with acute-on-chronic back pain.  Patient states he has had severe, debilitating back pain for the past 3+ years, however has noticed an acute worsening over the past 2-3 months.  He and wife state there is no associated trauma or injury.  He does not remember a specific date or activity that lead to worsening of his back pain. However, he has progressively been able to do less of his usual activities and reports inability to perform simple tasks around the home due to pain.  He rates the pain as an 8-9/10 at its worst.  He has been taking dilaudid without relief. He presents to [facility] clinic today for discussion re: treatment of his acute back pain.  He did recently undergo MR of the spine which showed an acute compression fracture at L4.   Past Medical History:  Diagnosis Date  . Coronary atherosclerosis of native coronary artery   . Hypogonadism male   . Obstructive chronic bronchitis with exacerbation ([place])   . Obstructive sleep apnea (adult) (pediatric)   . Other and unspecified hyperlipidemia   . Other emphysema ([place])   . Shortness of breath   . Type II or unspecified type diabetes mellitus without mention of complication, not stated as uncontrolled   . Unspecified essential hypertension     Past Surgical History:  Procedure Laterality Date  . L lens implant    . R knee replacement x2    . TONSILLECTOMY AND ADENOIDECTOMY    . vascectomy      Allergies: Iodinated diagnostic agents; Penicillins; Shellfish-derived products; Moxifloxacin; and Latex  Medications: Prior to Admission medications   Medication Sig  Start Date End Date Taking? Authorizing Provider  ALPRAZolam Duanne Moron) 0.5 MG tablet Take 0.5 mg by mouth daily as needed for anxiety.     [provider]  aspirin 81 MG tablet Take 81 mg by mouth daily.    [provider]  atorvastatin (LIPITOR) 40 MG tablet Take 1 tablet (40 mg total) by mouth daily. 07/23/12   Weber, Damaris Hippo, PA-C  gabapentin (NEURONTIN) 300 MG capsule Take 300 mg by mouth 2 (two) times daily.  07/05/14   [provider]  insulin aspart (NOVOLOG) 100 UNIT/ML injection Injection depends on SS    [provider]  lisinopril-hydrochlorothiazide (ZESTORETIC) 10-12.5 MG per tablet Take 1 tablet by mouth daily. 07/05/14 08/20/17  [provider]  metFORMIN (GLUCOPHAGE) 1000 MG tablet Take 1 tablet (1,000 mg total) by mouth 2 (two) times daily with a meal. 07/23/12   Weber, Damaris Hippo, PA-C  oxyCODONE-acetaminophen (PERCOCET/ROXICET) 5-325 MG tablet Take 1-2 tablets by mouth 3 (three) times daily.     [provider]  testosterone cypionate (DEPOTESTOSTERONE CYPIONATE) 200 MG/ML injection as directed.  10/24/16   [provider]  tiZANidine (ZANAFLEX) 4 MG tablet as directed. 06/27/17   [provider]     Family History  Problem Relation Age of Onset  . Emphysema Father   . Heart disease Father   . Clotting disorder Mother     Social History   Socioeconomic History  . Marital status: Married    Spouse name: Not on file  .  Number of children: Not on file  . Years of education: Not on file  . Highest education level: Not on file  Occupational History  . Occupation: Landscape architect  Social Needs  . Financial resource strain: Not on file  . Food insecurity:    Worry: Not on file    Inability: Not on file  . Transportation needs:    Medical: Not on file    Non-medical: Not on file  Tobacco Use  . Smoking status: Current Every Day Smoker    Packs/day: 0.30    Types: Cigarettes  . Smokeless  tobacco: Never Used  Substance and Sexual Activity  . Alcohol use: Not on file  . Drug use: Not on file  . Sexual activity: Not on file  Lifestyle  . Physical activity:    Days per week: Not on file    Minutes per session: Not on file  . Stress: Not on file  Relationships  . Social connections:    Talks on phone: Not on file    Gets together: Not on file    Attends religious service: Not on file    Active member of club or organization: Not on file    Attends meetings of clubs or organizations: Not on file    Relationship status: Not on file  Other Topics Concern  . Not on file  Social History Narrative  . Not on file     Review of Systems: A 12 point ROS discussed and pertinent positives are indicated in the HPI above.  All other systems are negative.  Review of Systems  Constitutional: Negative for fatigue and fever.  Respiratory: Negative for cough, shortness of breath and wheezing.   Cardiovascular: Negative for chest pain.  Gastrointestinal: Negative for abdominal pain.  Musculoskeletal: Positive for back pain (lower back, right hip, right gluteal region, right leg to mid calf) and gait problem.  Psychiatric/Behavioral: Negative for behavioral problems and confusion.    Vital Signs: There were no vitals taken for this visit.  Physical Exam  Constitutional: He is oriented to person, place, and time. He appears well-developed. No distress.  Cardiovascular: Normal rate, regular rhythm and normal heart sounds. Exam reveals no gallop and no friction rub.  No murmur heard. Pulmonary/Chest: Effort normal and breath sounds normal. No respiratory distress.  Abdominal: Soft. He exhibits no distension. There is no tenderness.  Musculoskeletal: He exhibits tenderness.  No point tenderness, however generalized moderate pain with exam over lower back and tail bone. Pain lateralizes to the right and into the right gluteal region.   Neurological: He is alert and oriented to  person, place, and time.  Skin: Skin is warm and dry. He is not diaphoretic.  Psychiatric: He has a normal mood and affect. His behavior is normal. Judgment and thought content normal.  Nursing note and vitals reviewed.        Imaging: Dg Bone Density (dxa)  Result Date: 05/28/2018 EXAM: DUAL X-RAY ABSORPTIOMETRY (DXA) FOR BONE MINERAL DENSITY IMPRESSION: Referring Physician:  Carol Ada Your patient completed a BMD test using Lunar IDXA DXA system ( analysis version: 16 ) manufactured by [employer]. PATIENT: Name: Justin Trujillo, Justin Trujillo Patient ID: [id number] Birth Date: May 25, 1946 Height: 65.0 in. Sex: Male Measured: 05/28/2018 Weight: 208.4 lbs. Indications: Advanced Age, Caucasian, Depotestosterone shot, Height Loss (781.91), History of Fracture (Adult) (V15.51), Low Calcium Intake (269.3) Fractures: vertebrae Treatments: None ASSESSMENT: The BMD measured at Forearm Radius 33% is 0.858 g/cm2 with a T-score of -1.4. This  patient is considered osteopenic according to the [facility]) criteria. The scan quality is limited by patient limited mobility. Lumbar spine was not utilized due to advanced degenerative changes. Site Region Measured Date Measured Age YA BMD Significant CHANGE T-score Left Forearm Radius 33% 05/28/2018 72.2 -1.4 0.858 g/cm2 DualFemur Neck Right 05/28/2018 73.2 -1.1 0.887 g/cm2 DualFemur Total Mean 05/28/2018 73.2 -0.1 1.000 g/cm2 World Health Organization [facility]) criteria for post-menopausal, Caucasian Women: Normal       T-score at or above -1 SD Osteopenia   T-score between -1 and -2.5 SD Osteoporosis T-score at or below -2.5 SD RECOMMENDATION: 1. All patients should optimize calcium and vitamin D intake. 2. Consider FDA approved medical therapies in postmenopausal women and men aged 32 years and older, based on the following: a. A hip or vertebral (clinical or morphometric) fracture b. T- score < or = -2.5 at the femoral neck or spine after appropriate evaluation  to exclude secondary causes c. Low bone mass (T-score between -1.0 and -2.5 at the femoral neck or spine) and a 10 year probability of a hip fracture > or = 3% or a 10 year probability of a major osteoporosis-related fracture > or = 20% based on the [place]-adapted WHO algorithm d. Clinician judgment and/or patient preferences may indicate treatment for people with 10-year fracture probabilities above or below these levels FOLLOW-UP: People with diagnosed cases of osteoporosis or osteopenia should be regularly tested for bone mineral density. For patients eligible for Medicare, routine testing is allowed once every 2 years. The testing frequency can be increased to one year for patients who have rapidly progressing disease, or for those who are receiving medical therapy to restore bone mass. I have reviewed this report and agree with the above findings. [facility] FRAX* 10-year Probability of Fracture Based on femoral neck BMD: DualFemur (Right) Major Osteoporotic Fracture: 9.3% Hip Fracture:                2.1% Population:                  [place] (Caucasian) Risk Factors:                History of Fracture (Adult) (V15.51) *FRAX is a Materials engineer of the [employer] for Metabolic Bone Disease, a World Pharmacologist (WHO) [facility]. ASSESSMENT: The probability of a major osteoporotic fracture is 9.3 % within the next ten years. The probability of hip fracture is  2.1 % within the next 10 years. Electronically Signed   By: Rolm Baptise M.D.   On: 05/28/2018 11:18    Labs:  CBC: No results for input(s): WBC, HGB, HCT, PLT in the last 8760 hours.  COAGS: No results for input(s): INR, APTT in the last 8760 hours.  BMP: No results for input(s): NA, K, CL, CO2, GLUCOSE, BUN, CALCIUM, CREATININE, GFRNONAA, GFRAA in the last 8760 hours.  Invalid input(s): CMP  LIVER FUNCTION TESTS: No results for input(s): BILITOT, AST, ALT, ALKPHOS, PROT, ALBUMIN in the  last 8760 hours.  TUMOR MARKERS: No results for input(s): AFPTM, CEA, CA199, CHROMGRNA in the last 8760 hours.  Assessment and Plan: L4 compression fracture Patients to [facility] clinic today accompanied by his wife for discussion of treatment of his L4 compression fracture.  He does not know when the fracture occurred, but has noticed severe worsening of his pain for the past 2-3 months and progressive debility.  He is taking dilaudid up to 5 times  daily without relief.  Dr. Bronson Curb has met with patient and his wife to discuss vertebroplasty/kyphoplasty. All risks and potential benefits were clearly explained to the patient and his wife.  All questions answered to their satisfaction.  He does have a cardiac history, but is followed by a Cardiologist.  He was recently cleared for knee surgery but unfortunately was unable to undergo due to his back pain.  They elect to proceed.  Will have schedulers contact them for arranging date/time of the procedure.   Risks and benefits were discussed with the patient including, but not limited to education regarding the natural healing process of compression fractures without intervention, bleeding, infection, cement migration which may cause spinal cord damage, paralysis, pulmonary embolism or even death.  This interventional procedure involves the use of X-rays and because of the nature of the planned procedure, it is possible that we will have prolonged use of X-ray fluoroscopy.  Potential radiation risks to you include (but are not limited to) the following: - A slightly elevated risk for cancer several years later in life. This risk is typically less than 0.5% percent. This risk is low in comparison to the normal incidence of human cancer, which is 33% for women and 50% for men according to the [place]. - Radiation induced injury can include skin redness, resembling a rash, tissue breakdown / ulcers and hair loss (which can be temporary or  permanent).   The likelihood of either of these occurring depends on the difficulty of the procedure and whether you are sensitive to radiation due to previous procedures, disease, or genetic conditions.   IF your procedure requires a prolonged use of radiation, you will be notified and given written instructions for further action.  It is your responsibility to monitor the irradiated area for the 2 weeks following the procedure and to notify your physician if you are concerned that you have suffered a radiation induced injury.    All of the patient's questions were answered, patient is agreeable to proceed.  Consent signed and in chart.  Thank you for this interesting consult.  I greatly enjoyed meeting Justin Trujillo and look forward to participating in their care.  A copy of this report was sent to the requesting provider on this date.  Electronically Signed: Docia Barrier, PA 06/05/2018, 1:50 PM   I spent a total of  30 Minutes   in face to face in clinical consultation, greater than 50% of which was counseling/coordinating care for L4 compression fracture.

## 2018-06-09 ENCOUNTER — Other Ambulatory Visit (HOSPITAL_COMMUNITY): Payer: Self-pay | Admitting: Interventional Radiology

## 2018-06-09 DIAGNOSIS — S32040A Wedge compression fracture of fourth lumbar vertebra, initial encounter for closed fracture: Secondary | ICD-10-CM

## 2018-06-12 ENCOUNTER — Other Ambulatory Visit: Payer: Self-pay | Admitting: Physician Assistant

## 2018-06-12 ENCOUNTER — Other Ambulatory Visit: Payer: Self-pay | Admitting: Radiology

## 2018-06-13 ENCOUNTER — Ambulatory Visit (HOSPITAL_COMMUNITY)
Admission: RE | Admit: 2018-06-13 | Discharge: 2018-06-13 | Disposition: A | Discharge status: Home / Self Care | Payer: PPO | Source: Ambulatory Visit | Attending: Interventional Radiology | Admitting: Interventional Radiology

## 2018-06-13 DIAGNOSIS — Z881 Allergy status to other antibiotic agents status: Secondary | ICD-10-CM | POA: Diagnosis not present

## 2018-06-13 DIAGNOSIS — E785 Hyperlipidemia, unspecified: Secondary | ICD-10-CM | POA: Diagnosis not present

## 2018-06-13 DIAGNOSIS — Z880 Allergy status to penicillin: Secondary | ICD-10-CM | POA: Diagnosis not present

## 2018-06-13 DIAGNOSIS — Z91013 Allergy to seafood: Secondary | ICD-10-CM | POA: Diagnosis not present

## 2018-06-13 DIAGNOSIS — Z96651 Presence of right artificial knee joint: Secondary | ICD-10-CM | POA: Insufficient documentation

## 2018-06-13 DIAGNOSIS — X58XXXA Exposure to other specified factors, initial encounter: Secondary | ICD-10-CM | POA: Insufficient documentation

## 2018-06-13 DIAGNOSIS — I10 Essential (primary) hypertension: Secondary | ICD-10-CM | POA: Insufficient documentation

## 2018-06-13 DIAGNOSIS — S32040A Wedge compression fracture of fourth lumbar vertebra, initial encounter for closed fracture: Secondary | ICD-10-CM

## 2018-06-13 DIAGNOSIS — Z794 Long term (current) use of insulin: Secondary | ICD-10-CM | POA: Diagnosis not present

## 2018-06-13 DIAGNOSIS — J449 Chronic obstructive pulmonary disease, unspecified: Secondary | ICD-10-CM | POA: Insufficient documentation

## 2018-06-13 DIAGNOSIS — Z79899 Other long term (current) drug therapy: Secondary | ICD-10-CM | POA: Diagnosis not present

## 2018-06-13 DIAGNOSIS — Z836 Family history of other diseases of the respiratory system: Secondary | ICD-10-CM | POA: Diagnosis not present

## 2018-06-13 DIAGNOSIS — Z91040 Latex allergy status: Secondary | ICD-10-CM | POA: Diagnosis not present

## 2018-06-13 DIAGNOSIS — Z7982 Long term (current) use of aspirin: Secondary | ICD-10-CM | POA: Diagnosis not present

## 2018-06-13 DIAGNOSIS — Z9852 Vasectomy status: Secondary | ICD-10-CM | POA: Insufficient documentation

## 2018-06-13 DIAGNOSIS — E119 Type 2 diabetes mellitus without complications: Secondary | ICD-10-CM | POA: Insufficient documentation

## 2018-06-13 DIAGNOSIS — G4733 Obstructive sleep apnea (adult) (pediatric): Secondary | ICD-10-CM | POA: Diagnosis not present

## 2018-06-13 DIAGNOSIS — I2510 Atherosclerotic heart disease of native coronary artery without angina pectoris: Secondary | ICD-10-CM | POA: Diagnosis not present

## 2018-06-13 DIAGNOSIS — Z8249 Family history of ischemic heart disease and other diseases of the circulatory system: Secondary | ICD-10-CM | POA: Insufficient documentation

## 2018-06-13 DIAGNOSIS — M4856XA Collapsed vertebra, not elsewhere classified, lumbar region, initial encounter for fracture: Secondary | ICD-10-CM | POA: Diagnosis not present

## 2018-06-13 DIAGNOSIS — F17210 Nicotine dependence, cigarettes, uncomplicated: Secondary | ICD-10-CM | POA: Insufficient documentation

## 2018-06-13 HISTORY — PX: IR KYPHO LUMBAR INC FX REDUCE BONE BX UNI/BIL CANNULATION INC/IMAGING: IMG5519

## 2018-06-13 LAB — PROTIME-INR
INR: 1.04
Prothrombin Time: 13.5 seconds (ref 11.4–15.2)

## 2018-06-13 LAB — CBC
HCT: 45.6 % (ref 39.0–52.0)
HEMOGLOBIN: 14.9 g/dL (ref 13.0–17.0)
MCH: 29.3 pg (ref 26.0–34.0)
MCHC: 32.7 g/dL (ref 30.0–36.0)
MCV: 89.8 fL (ref 78.0–100.0)
PLATELETS: 202 10*3/uL (ref 150–400)
RBC: 5.08 MIL/uL (ref 4.22–5.81)
RDW: 12.9 % (ref 11.5–15.5)
WBC: 15.9 10*3/uL — ABNORMAL HIGH (ref 4.0–10.5)

## 2018-06-13 LAB — BASIC METABOLIC PANEL
Anion gap: 12 (ref 5–15)
BUN: 23 mg/dL (ref 8–23)
CHLORIDE: 101 mmol/L (ref 98–111)
CO2: 22 mmol/L (ref 22–32)
CREATININE: 1.18 mg/dL (ref 0.61–1.24)
Calcium: 9.7 mg/dL (ref 8.9–10.3)
GFR calc Af Amer: >60 mL/min (ref >60)
GFR calc non Af Amer: 59 mL/min — ABNORMAL LOW (ref >60)
Glucose, Bld: 188 mg/dL — ABNORMAL HIGH (ref 70–99)
Potassium: 4.2 mmol/L (ref 3.5–5.1)
SODIUM: 135 mmol/L (ref 135–145)

## 2018-06-13 MED ORDER — SODIUM CHLORIDE 0.9 % IV SOLN: INTRAVENOUS | Status: DC

## 2018-06-13 MED ORDER — GELATIN ABSORBABLE 12-7 MM EX MISC
CUTANEOUS | Status: AC
  Filled 2018-06-13: qty 1

## 2018-06-13 MED ORDER — FENTANYL CITRATE (PF) 100 MCG/2ML IJ SOLN
INTRAMUSCULAR | Status: AC | PRN
  Administered 2018-06-13 (×3): 25 ug via INTRAVENOUS

## 2018-06-13 MED ORDER — HYDRALAZINE HCL 20 MG/ML IJ SOLN: 5.0000 mg | INTRAMUSCULAR | Status: DC | PRN

## 2018-06-13 MED ORDER — VANCOMYCIN HCL IN DEXTROSE 1-5 GM/200ML-% IV SOLN
INTRAVENOUS | Status: AC
  Filled 2018-06-13: qty 200

## 2018-06-13 MED ORDER — MIDAZOLAM HCL 2 MG/2ML IJ SOLN
INTRAMUSCULAR | Status: AC
  Filled 2018-06-13: qty 2

## 2018-06-13 MED ORDER — SODIUM CHLORIDE 0.9 % IV SOLN
INTRAVENOUS | Status: AC | PRN
  Administered 2018-06-13: 10 mL/h via INTRAVENOUS

## 2018-06-13 MED ORDER — HYDRALAZINE HCL 20 MG/ML IJ SOLN: 5.0000 mg | Freq: Once | INTRAMUSCULAR | Status: DC

## 2018-06-13 MED ORDER — HYDROMORPHONE HCL 1 MG/ML IJ SOLN
INTRAMUSCULAR | Status: AC | PRN
  Administered 2018-06-13: 1 mg via INTRAVENOUS

## 2018-06-13 MED ORDER — FENTANYL CITRATE (PF) 100 MCG/2ML IJ SOLN
INTRAMUSCULAR | Status: AC
  Filled 2018-06-13: qty 2

## 2018-06-13 MED ORDER — VANCOMYCIN HCL IN DEXTROSE 1-5 GM/200ML-% IV SOLN: 1000.0000 mg | INTRAVENOUS | Status: DC

## 2018-06-13 MED ORDER — HYDROMORPHONE HCL 1 MG/ML IJ SOLN
INTRAMUSCULAR | Status: AC
  Filled 2018-06-13: qty 1

## 2018-06-13 MED ORDER — IOPAMIDOL (ISOVUE-300) INJECTION 61%
INTRAVENOUS | Status: AC
  Administered 2018-06-13: 5 mL
  Filled 2018-06-13: qty 50

## 2018-06-13 MED ORDER — SODIUM CHLORIDE 0.9 % IV SOLN: INTRAVENOUS | Status: AC

## 2018-06-13 MED ORDER — BUPIVACAINE HCL (PF) 0.5 % IJ SOLN
INTRAMUSCULAR | Status: AC | PRN
  Administered 2018-06-13: 20 mL

## 2018-06-13 MED ORDER — BUPIVACAINE HCL (PF) 0.5 % IJ SOLN
INTRAMUSCULAR | Status: AC
  Filled 2018-06-13: qty 30

## 2018-06-13 MED ORDER — TOBRAMYCIN SULFATE 1.2 G IJ SOLR
INTRAMUSCULAR | Status: AC
  Filled 2018-06-13: qty 1.2

## 2018-06-13 MED ORDER — HYDRALAZINE HCL 20 MG/ML IJ SOLN
INTRAMUSCULAR | Status: AC | PRN
  Administered 2018-06-13: 5 mg via INTRAVENOUS

## 2018-06-13 MED ORDER — HYDRALAZINE HCL 20 MG/ML IJ SOLN
INTRAMUSCULAR | Status: AC
  Filled 2018-06-13: qty 1

## 2018-06-13 MED ORDER — MIDAZOLAM HCL 2 MG/2ML IJ SOLN
INTRAMUSCULAR | Status: AC | PRN
  Administered 2018-06-13 (×3): 1 mg via INTRAVENOUS

## 2018-06-13 NOTE — H&P (Signed)
Chief Complaint: Patient was seen in consultation today for L4 compression fracture  Referring Physician(s): Dr. Nelva Bush  Supervising Physician: Luanne Bras  Patient Status: [facility] - Out-pt  History of Present Illness: Justin Trujillo is a 72 y.o. male with past medical history of CAD, HTN, COPD, tobacco abuse, DM 2, HLD, DJD, spinal stenosis presents with acute-on-chronic back pain.  He recently met with Dr. Estanislado Pandy in consultation to discuss treatment of his L4 compression fracture.  He has elected to proceed and presents to radiology for procedure today.    He has been NPO.  He presents in his usual state of health.  Denies changes to condition or new symptoms since last seen.  He does not take blood thinners.   Past Medical History:  Diagnosis Date  . Coronary atherosclerosis of native coronary artery   . Hypogonadism male   . Obstructive chronic bronchitis with exacerbation ([place])   . Obstructive sleep apnea (adult) (pediatric)   . Other and unspecified hyperlipidemia   . Other emphysema ([place])   . Shortness of breath   . Type II or unspecified type diabetes mellitus without mention of complication, not stated as uncontrolled   . Unspecified essential hypertension     Past Surgical History:  Procedure Laterality Date  . L lens implant    . R knee replacement x2    . TONSILLECTOMY AND ADENOIDECTOMY    . vascectomy      Allergies: Iodinated diagnostic agents; Penicillins; Shellfish-derived products; Moxifloxacin; and Latex  Medications: Prior to Admission medications   Medication Sig Start Date End Date Taking? Authorizing Provider  ALPRAZolam Duanne Moron) 0.5 MG tablet Take 0.5 mg by mouth daily as needed for anxiety.    Yes [provider]  aspirin 81 MG tablet Take 81 mg by mouth daily.   Yes [provider]  atorvastatin (LIPITOR) 40 MG tablet Take 1 tablet (40 mg total) by mouth daily. 07/23/12  Yes Weber, Damaris Hippo, PA-C    lisinopril-hydrochlorothiazide (ZESTORETIC) 20-12.5 MG tablet Take 1 tablet by mouth daily.  07/05/14  Yes [provider]  metFORMIN (GLUCOPHAGE) 1000 MG tablet Take 1 tablet (1,000 mg total) by mouth 2 (two) times daily with a meal. 07/23/12  Yes Weber, Sarah L, PA-C  naproxen (NAPROSYN) 500 MG tablet Take 500 mg by mouth 2 (two) times daily as needed for moderate pain.   Yes [provider]  rOPINIRole (REQUIP) 0.5 MG tablet Take 0.5 mg by mouth at bedtime as needed (LEG CRAMPING).   Yes [provider]  tapentadol (NUCYNTA) 50 MG tablet Take 50 mg by mouth every 4 (four) hours as needed for moderate pain.   Yes [provider]  testosterone cypionate (DEPOTESTOSTERONE CYPIONATE) 200 MG/ML injection Inject 200 mg into the muscle every 14 (fourteen) days.  10/24/16  Yes [provider]  tiZANidine (ZANAFLEX) 4 MG tablet Take 4 mg by mouth 3 (three) times daily as needed for muscle spasms.  06/27/17  Yes [provider]  trolamine salicylate (ASPERCREME) 10 % cream Apply 1 application topically as needed for muscle pain.   Yes [provider]  insulin NPH-regular Human (NOVOLIN 70/30) (70-30) 100 UNIT/ML injection Inject 10-15 Units into the skin daily as needed (ONLY IF BLOOD SUGAR GOES OVER 150).    [provider]     Family History  Problem Relation Age of Onset  . Emphysema Father   . Heart disease Father   . Clotting disorder Mother  Social History   Socioeconomic History  . Marital status: Married    Spouse name: Not on file  . Number of children: Not on file  . Years of education: Not on file  . Highest education level: Not on file  Occupational History  . Occupation: Landscape architect  Social Needs  . Financial resource strain: Not on file  . Food insecurity:    Worry: Not on file    Inability: Not on file  . Transportation needs:    Medical: Not on file    Non-medical: Not on file   Tobacco Use  . Smoking status: Current Every Day Smoker    Packs/day: 0.30    Types: Cigarettes  . Smokeless tobacco: Never Used  Substance and Sexual Activity  . Alcohol use: Not on file  . Drug use: Not on file  . Sexual activity: Not on file  Lifestyle  . Physical activity:    Days per week: Not on file    Minutes per session: Not on file  . Stress: Not on file  Relationships  . Social connections:    Talks on phone: Not on file    Gets together: Not on file    Attends religious service: Not on file    Active member of club or organization: Not on file    Attends meetings of clubs or organizations: Not on file    Relationship status: Not on file  Other Topics Concern  . Not on file  Social History Narrative  . Not on file    Review of Systems: A 12 point ROS discussed and pertinent positives are indicated in the HPI above.  All other systems are negative.  Review of Systems  Constitutional: Negative for fatigue and fever.  Respiratory: Negative for cough and shortness of breath.   Cardiovascular: Negative for chest pain.  Gastrointestinal: Negative for abdominal pain.  Genitourinary: Negative for dysuria, frequency and urgency.  Musculoskeletal: Positive for back pain.  Psychiatric/Behavioral: Negative for behavioral problems and confusion.    Vital Signs: BP (!) 156/90   Pulse 99   Temp 97.9 F (36.6 C) (Oral)   Resp 20   Ht _0  (1.676 m)   Wt 220 lb (99.8 kg)   SpO2 94%   BMI 35.51 kg/m   Physical Exam  Constitutional: He is oriented to person, place, and time. He appears well-developed. No distress.  Neck: Normal range of motion. Neck supple.  Cardiovascular: Normal rate, regular rhythm and normal heart sounds. Exam reveals no gallop and no friction rub.  No murmur heard. Pulmonary/Chest: Effort normal and breath sounds normal. No respiratory distress.  Abdominal: Soft.  Musculoskeletal: He exhibits tenderness (lower back pain).  Neurological: He  is alert and oriented to person, place, and time.  Skin: Skin is warm and dry. He is not diaphoretic.  Psychiatric: He has a normal mood and affect. His behavior is normal. Judgment and thought content normal.  Nursing note and vitals reviewed.    MD Evaluation Airway: WNL Heart: WNL Abdomen: WNL Chest/ Lungs: WNL ASA  Classification: 3 Mallampati/Airway Score: Two   Imaging: Dg Bone Density (dxa)  Result Date: 05/28/2018 EXAM: DUAL X-RAY ABSORPTIOMETRY (DXA) FOR BONE MINERAL DENSITY IMPRESSION: Referring Physician:  Carol Ada Your patient completed a BMD test using Lunar IDXA DXA system ( analysis version: 16 ) manufactured by [employer]. PATIENT: Name: Shenouda, Genova Patient ID: [id number] Birth Date: 1945/12/06 Height: 65.0 in. Sex: Male Measured: 05/28/2018 Weight: 208.4 lbs. Indications:  Advanced Age, Caucasian, Depotestosterone shot, Height Loss (781.91), History of Fracture (Adult) (V15.51), Low Calcium Intake (269.3) Fractures: vertebrae Treatments: None ASSESSMENT: The BMD measured at Forearm Radius 33% is 0.858 g/cm2 with a T-score of -1.4. This patient is considered osteopenic according to the [facility]) criteria. The scan quality is limited by patient limited mobility. Lumbar spine was not utilized due to advanced degenerative changes. Site Region Measured Date Measured Age [age]A BMD Significant CHANGE T-score Left Forearm Radius 33% 05/28/2018 [age] -1.4 0.858 g/cm2 DualFemur Neck Right 05/28/2018 72.2 -1.1 0.887 g/cm2 DualFemur Total Mean 05/28/2018 73.2 -0.1 1.000 g/cm2 World Health Organization [facility]) criteria for post-menopausal, Caucasian Women: Normal       T-score at or above -1 SD Osteopenia   T-score between -1 and -2.5 SD Osteoporosis T-score at or below -2.5 SD RECOMMENDATION: 1. All patients should optimize calcium and vitamin D intake. 2. Consider FDA approved medical therapies in postmenopausal women and men aged 88 years and older, based on the  following: a. A hip or vertebral (clinical or morphometric) fracture b. T- score < or = -2.5 at the femoral neck or spine after appropriate evaluation to exclude secondary causes c. Low bone mass (T-score between -1.0 and -2.5 at the femoral neck or spine) and a 10 year probability of a hip fracture > or = 3% or a 10 year probability of a major osteoporosis-related fracture > or = 20% based on the [place]-adapted WHO algorithm d. Clinician judgment and/or patient preferences may indicate treatment for people with 10-year fracture probabilities above or below these levels FOLLOW-UP: People with diagnosed cases of osteoporosis or osteopenia should be regularly tested for bone mineral density. For patients eligible for Medicare, routine testing is allowed once every 2 years. The testing frequency can be increased to one year for patients who have rapidly progressing disease, or for those who are receiving medical therapy to restore bone mass. I have reviewed this report and agree with the above findings. [place] [facility] FRAX* 10-year Probability of Fracture Based on femoral neck BMD: DualFemur (Right) Major Osteoporotic Fracture: 9.3% Hip Fracture:                2.1% Population:                  [place] (Caucasian) Risk Factors:                History of Fracture (Adult) (V15.51) *FRAX is a Materials engineer of the [employer] for Metabolic Bone Disease, a World Pharmacologist (WHO) [facility]. ASSESSMENT: The probability of a major osteoporotic fracture is 9.3 % within the next ten years. The probability of hip fracture is  2.1 % within the next 10 years. Electronically Signed   By: Rolm Baptise M.D.   On: 05/28/2018 11:18    Labs:  CBC: Recent Labs    06/13/18 0646  WBC 15.9*  HGB 14.9  HCT 45.6  PLT 202    COAGS: Recent Labs    06/13/18 0646  INR 1.04    BMP: Recent Labs    06/13/18 0646  NA 135  K 4.2  CL 101  CO2 22  GLUCOSE 188*  BUN 23    CALCIUM 9.7  CREATININE 1.18  GFRNONAA 59*  GFRAA >60    LIVER FUNCTION TESTS: No results for input(s): BILITOT, AST, ALT, ALKPHOS, PROT, ALBUMIN in the last 8760 hours.  TUMOR MARKERS: No results for input(s): AFPTM, CEA, CA199, CHROMGRNA  in the last 8760 hours.  Assessment and Plan: Patient with past medical history of chronic back pain presents with complaint of acute L4 compression fracture.  IR consulted for vertebroplasty/kyphoplasty at the request of Dr. Nelva Bush. Case reviewed by Dr. Estanislado Pandy who approves patient for procedure and has met with patient and wife to discuss risk, benefits, expectation.  Patient presents today in their usual state of health.  He has been NPO and is not currently on blood thinners.   Risks and benefits were discussed with the patient including, but not limited to education regarding the natural healing process of compression fractures without intervention, bleeding, infection, cement migration which may cause spinal cord damage, paralysis, pulmonary embolism or even death.  This interventional procedure involves the use of X-rays and because of the nature of the planned procedure, it is possible that we will have prolonged use of X-ray fluoroscopy.  Potential radiation risks to you include (but are not limited to) the following: - A slightly elevated risk for cancer  several years later in life. This risk is typically less than 0.5% percent. This risk is low in comparison to the normal incidence of human cancer, which is 33% for women and 50% for men according to the [place]. - Radiation induced injury can include skin redness, resembling a rash, tissue breakdown / ulcers and hair loss (which can be temporary or permanent).   The likelihood of either of these occurring depends on the difficulty of the procedure and whether you are sensitive to radiation due to previous procedures, disease, or genetic conditions.   IF your procedure  requires a prolonged use of radiation, you will be notified and given written instructions for further action.  It is your responsibility to monitor the irradiated area for the 2 weeks following the procedure and to notify your physician if you are concerned that you have suffered a radiation induced injury.    All of the patient's questions were answered, patient is agreeable to proceed.  Consent signed and in chart.  Thank you for this interesting consult.  I greatly enjoyed meeting ARCH METHOT and look forward to participating in their care.  A copy of this report was sent to the requesting provider on this date.  Electronically Signed: Docia Barrier, PA 06/13/2018, 7:52 AM   I spent a total of    15 Minutes in face to face in clinical consultation, greater than 50% of which was counseling/coordinating care for L4 compression fracture.

## 2018-06-13 NOTE — Procedures (Signed)
S/P L 5 balloon  KP

## 2018-06-13 NOTE — Discharge Instructions (Signed)
1. No stooping,bending or lifting more than 10 lbs for 2 weeks  2.Use walker to ambulate for 2 weeks  3. No driving for 2 weeks. 4.RTC PRN 2 weeks    Balloon Kyphoplasty, Care After Refer to this sheet in the next few weeks. These instructions provide you with information about caring for yourself after your procedure. Your health care provider may also give you more specific instructions. Your treatment has been planned according to current medical practices, but problems sometimes occur. Call your health care provider if you have any problems or questions after your procedure. What can I expect after the procedure? After your procedure, it is common to have back pain. Follow these instructions at home: Incision care  Follow instructions from your health care provider about how to take care of your incisions. Make sure you: ? Wash your hands with soap and water before you remove your bandage (dressing). If soap and water are not available, use hand sanitizer. ? Leave stitches (sutures), skin glue, or adhesive strips in place. These skin closures may need to be in place for 2 weeks or longer. If adhesive strip edges start to loosen and curl up, you may trim the loose edges. Do not remove adhesive strips completely unless your health care provider tells you to do that.  Check your incision area every day for signs of infection. Watch for: ? Redness, swelling, or pain. ? Fluid, blood, or pus.  Keep your dressing dry until your health care provider says that it can be removed.  Dressing may be removed in 24-48 hours. Wait until removal to shower.  Do not soak (baths, hot tubs, etc) until site has healed. Activity  Rest your back and avoid intense physical activity for as long as told by your health care provider.  Return to your normal activities as told by your health care provider. Ask your health care provider what activities are safe for you.  Do not lift anything that is heavier  than 10 lb (4.5 kg). This is about the weight of a gallon of milk.You may need to avoid heavy lifting for several weeks. General instructions  Take over-the-counter and prescription medicines only as told by your health care provider.  If directed, apply ice to the painful area: ? Put ice in a plastic bag. ? Place a towel between your skin and the bag. ? Leave the ice on for 20 minutes, 2-3 times per day.  Do not use tobacco products, including cigarettes, chewing tobacco, or e-cigarettes. If you need help quitting, ask your health care provider.  Keep all follow-up visits as told by your health care provider. This is important. Contact a health care provider if:  You have a fever.  You have redness, swelling, or pain at the site of your incisions.  You have fluid, blood, or pus coming from your incisions.  You have pain that gets worse or does not get better with medicine.  You develop numbness or weakness in any part of your body. Get help right away if:  You have chest pain.  You have difficulty breathing.  You cannot move your legs.  You cannot control your bladder or bowel movements.  You suddenly become weak or numb on one side of your body.  You become very confused.  You have trouble speaking or understanding, or both. This information is not intended to replace advice given to you by your health care provider. Make sure you discuss any questions you have with your  health care provider. Document Released: 05/25/2015 Document Revised: 02/09/2016 Document Reviewed: 12/27/2014 Elsevier Interactive Patient Education  [employer].

## 2018-06-16 ENCOUNTER — Encounter (HOSPITAL_COMMUNITY): Payer: Self-pay | Admitting: Interventional Radiology

## 2018-06-20 DIAGNOSIS — K5903 Drug induced constipation: Secondary | ICD-10-CM | POA: Diagnosis not present

## 2018-06-20 DIAGNOSIS — M48061 Spinal stenosis, lumbar region without neurogenic claudication: Secondary | ICD-10-CM | POA: Diagnosis not present

## 2018-06-20 DIAGNOSIS — Z79899 Other long term (current) drug therapy: Secondary | ICD-10-CM | POA: Diagnosis not present

## 2018-06-20 DIAGNOSIS — G894 Chronic pain syndrome: Secondary | ICD-10-CM | POA: Diagnosis not present

## 2018-06-30 DIAGNOSIS — S32000A Wedge compression fracture of unspecified lumbar vertebra, initial encounter for closed fracture: Secondary | ICD-10-CM | POA: Diagnosis not present

## 2018-06-30 DIAGNOSIS — Z79899 Other long term (current) drug therapy: Secondary | ICD-10-CM | POA: Diagnosis not present

## 2018-06-30 DIAGNOSIS — M5416 Radiculopathy, lumbar region: Secondary | ICD-10-CM | POA: Diagnosis not present

## 2018-06-30 DIAGNOSIS — M48061 Spinal stenosis, lumbar region without neurogenic claudication: Secondary | ICD-10-CM | POA: Diagnosis not present

## 2018-07-17 DIAGNOSIS — M48061 Spinal stenosis, lumbar region without neurogenic claudication: Secondary | ICD-10-CM | POA: Diagnosis not present

## 2018-07-17 DIAGNOSIS — M5136 Other intervertebral disc degeneration, lumbar region: Secondary | ICD-10-CM | POA: Diagnosis not present

## 2018-08-04 DIAGNOSIS — M4187 Other forms of scoliosis, lumbosacral region: Secondary | ICD-10-CM | POA: Diagnosis not present

## 2018-08-04 DIAGNOSIS — M4310 Spondylolisthesis, site unspecified: Secondary | ICD-10-CM | POA: Diagnosis not present

## 2018-08-04 DIAGNOSIS — M5416 Radiculopathy, lumbar region: Secondary | ICD-10-CM | POA: Diagnosis not present

## 2018-08-04 DIAGNOSIS — M5136 Other intervertebral disc degeneration, lumbar region: Secondary | ICD-10-CM | POA: Diagnosis not present

## 2018-08-04 DIAGNOSIS — M48061 Spinal stenosis, lumbar region without neurogenic claudication: Secondary | ICD-10-CM | POA: Diagnosis not present

## 2018-08-28 DIAGNOSIS — M4316 Spondylolisthesis, lumbar region: Secondary | ICD-10-CM | POA: Diagnosis not present

## 2018-08-28 DIAGNOSIS — M4150 Other secondary scoliosis, site unspecified: Secondary | ICD-10-CM | POA: Diagnosis not present

## 2018-08-28 DIAGNOSIS — M419 Scoliosis, unspecified: Secondary | ICD-10-CM | POA: Diagnosis not present

## 2018-09-03 ENCOUNTER — Other Ambulatory Visit: Payer: Self-pay | Admitting: Neurological Surgery

## 2018-09-03 ENCOUNTER — Other Ambulatory Visit (HOSPITAL_COMMUNITY): Payer: Self-pay | Admitting: Neurological Surgery

## 2018-09-03 DIAGNOSIS — M4150 Other secondary scoliosis, site unspecified: Secondary | ICD-10-CM

## 2018-09-08 MED ORDER — SODIUM CHLORIDE 0.9 % IV SOLN: 4.0000 mg | Freq: Four times a day (QID) | INTRAVENOUS | Status: DC | PRN

## 2018-09-22 DIAGNOSIS — Z0000 Encounter for general adult medical examination without abnormal findings: Secondary | ICD-10-CM | POA: Diagnosis not present

## 2018-09-22 DIAGNOSIS — G4700 Insomnia, unspecified: Secondary | ICD-10-CM | POA: Diagnosis not present

## 2018-09-22 DIAGNOSIS — E291 Testicular hypofunction: Secondary | ICD-10-CM | POA: Diagnosis not present

## 2018-09-22 DIAGNOSIS — I10 Essential (primary) hypertension: Secondary | ICD-10-CM | POA: Diagnosis not present

## 2018-09-22 DIAGNOSIS — Z794 Long term (current) use of insulin: Secondary | ICD-10-CM | POA: Diagnosis not present

## 2018-09-22 DIAGNOSIS — E785 Hyperlipidemia, unspecified: Secondary | ICD-10-CM | POA: Diagnosis not present

## 2018-09-22 DIAGNOSIS — Z1389 Encounter for screening for other disorder: Secondary | ICD-10-CM | POA: Diagnosis not present

## 2018-09-22 DIAGNOSIS — E1121 Type 2 diabetes mellitus with diabetic nephropathy: Secondary | ICD-10-CM | POA: Diagnosis not present

## 2018-09-29 ENCOUNTER — Ambulatory Visit (HOSPITAL_COMMUNITY)
Admission: RE | Admit: 2018-09-29 | Discharge: 2018-09-29 | Disposition: A | Discharge status: Home / Self Care | Payer: PPO | Source: Ambulatory Visit | Attending: Neurological Surgery | Admitting: Neurological Surgery

## 2018-09-29 ENCOUNTER — Other Ambulatory Visit: Payer: Self-pay

## 2018-09-29 ENCOUNTER — Ambulatory Visit (HOSPITAL_COMMUNITY): Payer: PPO

## 2018-09-29 ENCOUNTER — Encounter (HOSPITAL_COMMUNITY): Payer: Self-pay

## 2018-09-29 DIAGNOSIS — M5136 Other intervertebral disc degeneration, lumbar region: Secondary | ICD-10-CM | POA: Insufficient documentation

## 2018-09-29 DIAGNOSIS — M4804 Spinal stenosis, thoracic region: Secondary | ICD-10-CM | POA: Insufficient documentation

## 2018-09-29 DIAGNOSIS — M48062 Spinal stenosis, lumbar region with neurogenic claudication: Secondary | ICD-10-CM | POA: Insufficient documentation

## 2018-09-29 DIAGNOSIS — M9953 Intervertebral disc stenosis of neural canal of lumbar region: Secondary | ICD-10-CM | POA: Diagnosis not present

## 2018-09-29 DIAGNOSIS — M4150 Other secondary scoliosis, site unspecified: Secondary | ICD-10-CM | POA: Diagnosis not present

## 2018-09-29 DIAGNOSIS — M47894 Other spondylosis, thoracic region: Secondary | ICD-10-CM | POA: Diagnosis not present

## 2018-09-29 LAB — GLUCOSE, CAPILLARY: Glucose-Capillary: 191 mg/dL — ABNORMAL HIGH (ref 70–99)

## 2018-09-29 MED ORDER — LIDOCAINE HCL (PF) 1 % IJ SOLN
5.0000 mL | Freq: Once | INTRAMUSCULAR | Status: AC
  Administered 2018-09-29: 5 mL via INTRADERMAL

## 2018-09-29 MED ORDER — DIAZEPAM 5 MG PO TABS
10.0000 mg | ORAL_TABLET | Freq: Once | ORAL | Status: AC
  Administered 2018-09-29: 10 mg via ORAL

## 2018-09-29 MED ORDER — IOPAMIDOL (ISOVUE-M 300) INJECTION 61%
15.0000 mL | Freq: Once | INTRAMUSCULAR | Status: AC | PRN
  Administered 2018-09-29: 15 mL via INTRATHECAL

## 2018-09-29 MED ORDER — HYDROCODONE-ACETAMINOPHEN 5-325 MG PO TABS
1.0000 | ORAL_TABLET | ORAL | Status: DC | PRN
  Administered 2018-09-29: 1 via ORAL

## 2018-09-29 MED ORDER — ONDANSETRON HCL 4 MG/2ML IJ SOLN: 4.0000 mg | Freq: Four times a day (QID) | INTRAMUSCULAR | Status: DC | PRN

## 2018-09-29 MED ORDER — HYDROCODONE-ACETAMINOPHEN 5-325 MG PO TABS
ORAL_TABLET | ORAL | Status: AC
  Filled 2018-09-29: qty 1

## 2018-09-29 MED ORDER — DIAZEPAM 5 MG PO TABS
ORAL_TABLET | ORAL | Status: AC
  Administered 2018-09-29: 10 mg via ORAL
  Filled 2018-09-29: qty 2

## 2018-09-29 NOTE — Procedures (Signed)
Justin Trujillo is a 73 year old individual whose had severe progressive weakness in his legs with unrelenting back pain that has been causing him increasing problems he has evidence of a degenerative scoliosis on his plain x-rays and a suggestion of a high-grade stenosis at the level of L3-4 and perhaps at L4-5 is been advised regarding myelography to better evaluate this in the thoracolumbar junction.  Pre op Dx: Degenerative scoliosis, lumbar stenosis Post op Dx: Same Procedure: Thoracic and lumbar myelogram Surgeon: Stamatia Masri Puncture level: Isovue-300, 7 mL Fluid color: Clear colorless Injection: L2-L3 Findings: Severe stenosis L3-4 and L4-5 with degenerative scoliosis relative stenosis at the L1-L2 level.  Further evaluation with CT scanning.

## 2018-09-29 NOTE — Discharge Instructions (Signed)
Hold Metformin for 48 hours  Myelogram, Care After Refer to this sheet in the next few weeks. These instructions provide you with information about caring for yourself after your procedure. Your health care provider may also give you more specific instructions. Your treatment has been planned according to current medical practices, but problems sometimes occur. Call your health care provider if you have any problems or questions after your procedure. What can I expect after the procedure? After the procedure, it is common to have:  Soreness at your injection site.  A mild headache. Follow these instructions at home:  Drink enough fluid to keep your urine clear or pale yellow. This will help flush out the dye (contrast material) from your spine.  Rest as told by your health care provider. Lie flat with your head slightly raised (elevated) to reduce the risk of headache.  Do not bend, lift, or do any strenuous activity for 24-48 hours or as told by your health care provider.  Take over-the-counter and prescription medicines only as told by your health care provider.  Take care of and remove your bandage (dressing) as told by your health care provider.  Bathe or shower as told by your health care provider. Contact a health care provider if:  You have a fever.  You have a headache that lasts longer than 24 hours.  You feel nauseous or vomit.  You have a stiff neck or numbness in your legs.  You are unable to urinate or have a bowel movement.  You develop a rash, itching, or sneezing. Get help right away if:  You have new symptoms or your symptoms get worse.  You have a seizure.  You have trouble breathing. This information is not intended to replace advice given to you by your health care provider. Make sure you discuss any questions you have with your health care provider. Document Released: 09/30/2015 Document Revised: 02/09/2016 Document Reviewed: 06/16/2015 Elsevier  Interactive Patient Education  2019 [employer].

## 2018-09-29 NOTE — Progress Notes (Signed)
Pt took his premeds at 2000 09-28-2018 and 0200 and 0800 Benedryl and prednisone

## 2018-10-01 DIAGNOSIS — M4150 Other secondary scoliosis, site unspecified: Secondary | ICD-10-CM | POA: Diagnosis not present

## 2018-10-01 DIAGNOSIS — M48062 Spinal stenosis, lumbar region with neurogenic claudication: Secondary | ICD-10-CM | POA: Diagnosis not present

## 2018-10-02 DIAGNOSIS — M545 Low back pain: Secondary | ICD-10-CM | POA: Diagnosis not present

## 2018-10-02 DIAGNOSIS — Z79891 Long term (current) use of opiate analgesic: Secondary | ICD-10-CM | POA: Diagnosis not present

## 2018-10-06 ENCOUNTER — Other Ambulatory Visit: Payer: Self-pay | Admitting: Neurological Surgery

## 2018-10-17 NOTE — Pre-Procedure Instructions (Signed)
JARRETTE DEHNER  10/17/2018      [facility] - [place], [name] - [address] [name] [address] Phone: [phone] Fax: [phone]    Your procedure is scheduled on February 11  Report to [facility] Admitting at 0900 A.M.  Call this number if you have problems the morning of surgery:  [phone]   Remember:  Do not eat or drink after midnight.    Take these medicines the morning of surgery with A SIP OF WATER  ALPRAZolam (XANAX)  gabapentin (NEURONTIN) linaclotide (LINZESS)  oxyCODONE (OXYCONTIN)  tiZANidine (ZANAFLEX)  7 days prior to surgery STOP taking any Aspirin (unless otherwise instructed by your surgeon), Aleve, Naproxen, Ibuprofen, Motrin, Advil, Goody's, BC's, all herbal medications, fish oil, and all vitamins.  Follow your surgeon's instructions on when to stop Asprin.  If no instructions were given by your surgeon then you will need to call the office to get those instructions.     WHAT DO I DO ABOUT MY DIABETES MEDICATION?   Marland Kitchen Do not take oral diabetes medicines (pills) the morning of surgery. metFORMIN (GLUCOPHAGE)   . If your CBG is greater than 220 mg/dL, you may take  of your sliding scale (correction) dose of insulin. insulin NPH-regular Human (NOVOLIN 70/30)   How to Manage Your Diabetes Before and After Surgery  Why is it important to control my blood sugar before and after surgery? . Improving blood sugar levels before and after surgery helps healing and can limit problems. . A way of improving blood sugar control is eating a healthy diet by: o  Eating less sugar and carbohydrates o  Increasing activity/exercise o  Talking with your doctor about reaching your blood sugar goals . High blood sugars (greater than 180 mg/dL) can raise your risk of infections and slow your recovery, so you will need to focus on controlling your diabetes during the weeks before surgery. . Make sure that the  doctor who takes care of your diabetes knows about your planned surgery including the date and location.  How do I manage my blood sugar before surgery? . Check your blood sugar at least 4 times a day, starting 2 days before surgery, to make sure that the level is not too high or low. o Check your blood sugar the morning of your surgery when you wake up and every 2 hours until you get to the Short Stay unit. . If your blood sugar is less than 70 mg/dL, you will need to treat for low blood sugar: o Do not take insulin. o Treat a low blood sugar (less than 70 mg/dL) with  cup of clear juice (cranberry or apple), 4 glucose tablets, OR glucose gel. o Recheck blood sugar in 15 minutes after treatment (to make sure it is greater than 70 mg/dL). If your blood sugar is not greater than 70 mg/dL on recheck, call [phone] for further instructions. . Report your blood sugar to the short stay nurse when you get to Short Stay.  . If you are admitted to the hospital after surgery: o Your blood sugar will be checked by the staff and you will probably be given insulin after surgery (instead of oral diabetes medicines) to make sure you have good blood sugar levels. o The goal for blood sugar control after surgery is 80-180 mg/dL.      Do not wear jewelry  Do not wear lotions, powders, or cologne, or deodorant.  Men may shave face and neck.  Do not bring valuables to the hospital.  [facility] is not responsible for any belongings or valuables.  Contacts, dentures or bridgework may not be worn into surgery.  Leave your suitcase in the car.  After surgery it may be brought to your room.  For patients admitted to the hospital, discharge time will be determined by your treatment team.  Patients discharged the day of surgery will not be allowed to drive home.    Special instructions:   [place]- Preparing For Surgery  Before surgery, you can play an important role. Because skin is not sterile,  your skin needs to be as free of germs as possible. You can reduce the number of germs on your skin by washing with CHG (chlorahexidine gluconate) Soap before surgery.  CHG is an antiseptic cleaner which kills germs and bonds with the skin to continue killing germs even after washing.    Oral Hygiene is also important to reduce your risk of infection.  Remember - BRUSH YOUR TEETH THE MORNING OF SURGERY WITH YOUR REGULAR TOOTHPASTE  Please do not use if you have an allergy to CHG or antibacterial soaps. If your skin becomes reddened/irritated stop using the CHG.  Do not shave (including legs and underarms) for at least 48 hours prior to first CHG shower. It is OK to shave your face.  Please follow these instructions carefully.   1. Shower the NIGHT BEFORE SURGERY and the MORNING OF SURGERY with CHG.   2. If you chose to wash your hair, wash your hair first as usual with your normal shampoo.  3. After you shampoo, rinse your hair and body thoroughly to remove the shampoo.  4. Use CHG as you would any other liquid soap. You can apply CHG directly to the skin and wash gently with a scrungie or a clean washcloth.   5. Apply the CHG Soap to your body ONLY FROM THE NECK DOWN.  Do not use on open wounds or open sores. Avoid contact with your eyes, ears, mouth and genitals (private parts). Wash Face and genitals (private parts)  with your normal soap.  6. Wash thoroughly, paying special attention to the area where your surgery will be performed.  7. Thoroughly rinse your body with warm water from the neck down.  8. DO NOT shower/wash with your normal soap after using and rinsing off the CHG Soap.  9. Pat yourself dry with a CLEAN TOWEL.  10. Wear CLEAN PAJAMAS to bed the night before surgery, wear comfortable clothes the morning of surgery  11. Place CLEAN SHEETS on your bed the night of your first shower and DO NOT SLEEP WITH PETS.    Day of Surgery:  Do not apply any deodorants/lotions.   Please wear clean clothes to the hospital/surgery center.   Remember to brush your teeth WITH YOUR REGULAR TOOTHPASTE.    Please read over the following fact sheets that you were given.

## 2018-10-20 ENCOUNTER — Other Ambulatory Visit: Payer: Self-pay

## 2018-10-20 ENCOUNTER — Encounter (HOSPITAL_COMMUNITY)
Admission: RE | Admit: 2018-10-20 | Discharge: 2018-10-20 | Disposition: A | Discharge status: Home / Self Care | Payer: PPO | Source: Ambulatory Visit | Attending: Neurological Surgery | Admitting: Neurological Surgery

## 2018-10-20 ENCOUNTER — Encounter (HOSPITAL_COMMUNITY): Payer: Self-pay

## 2018-10-20 DIAGNOSIS — Z01812 Encounter for preprocedural laboratory examination: Secondary | ICD-10-CM | POA: Diagnosis not present

## 2018-10-20 HISTORY — DX: Proteinuria, unspecified: R80.9

## 2018-10-20 HISTORY — DX: Unspecified osteoarthritis, unspecified site: M19.90

## 2018-10-20 LAB — HEMOGLOBIN A1C
Hgb A1c MFr Bld: 6.8 % — ABNORMAL HIGH (ref 4.8–5.6)
Mean Plasma Glucose: 148.46 mg/dL

## 2018-10-20 LAB — TYPE AND SCREEN
ABO/RH(D): A POS
ANTIBODY SCREEN: NEGATIVE

## 2018-10-20 LAB — BASIC METABOLIC PANEL
ANION GAP: 16 — AB (ref 5–15)
BUN: 22 mg/dL (ref 8–23)
CALCIUM: 9.8 mg/dL (ref 8.9–10.3)
CO2: 19 mmol/L — ABNORMAL LOW (ref 22–32)
Chloride: 103 mmol/L (ref 98–111)
Creatinine, Ser: 1.03 mg/dL (ref 0.61–1.24)
GFR calc Af Amer: >60 mL/min (ref >60)
GFR calc non Af Amer: >60 mL/min (ref >60)
Glucose, Bld: 90 mg/dL (ref 70–99)
Potassium: 4.9 mmol/L (ref 3.5–5.1)
Sodium: 138 mmol/L (ref 135–145)

## 2018-10-20 LAB — GLUCOSE, CAPILLARY: Glucose-Capillary: 120 mg/dL — ABNORMAL HIGH (ref 70–99)

## 2018-10-20 LAB — SURGICAL PCR SCREEN
MRSA, PCR: NEGATIVE
Staphylococcus aureus: NEGATIVE

## 2018-10-20 LAB — CBC
HCT: 46.7 % (ref 39.0–52.0)
HEMOGLOBIN: 14.7 g/dL (ref 13.0–17.0)
MCH: 29.2 pg (ref 26.0–34.0)
MCHC: 31.5 g/dL (ref 30.0–36.0)
MCV: 92.7 fL (ref 80.0–100.0)
Platelets: 208 10*3/uL (ref 150–400)
RBC: 5.04 MIL/uL (ref 4.22–5.81)
RDW: 12.7 % (ref 11.5–15.5)
WBC: 11.3 10*3/uL — ABNORMAL HIGH (ref 4.0–10.5)
nRBC: 0 % (ref 0.0–0.2)

## 2018-10-20 LAB — ABO/RH: ABO/RH(D): A POS

## 2018-10-20 NOTE — Progress Notes (Signed)
PCP - Dr. Carol Ada Cardiologist - Dr. Crissie Sickles; cardiac clearance in Epic 04/21/18.   Chest x-ray - N/A EKG - 03/07/18 Stress Test - 03/18/18 ECHO - 2011 Cardiac Cath -2003? No stents placed.    Sleep Study - OSA +; denies using a CPAP.  Fasting Blood Sugar - 90-110 Checks Blood Sugar 1 time a day  Aspirin Instructions:Stop ASA 10/21/18 until after surgery.   Anesthesia review: Yes, hx of CAD  Patient denies shortness of breath, fever, cough and chest pain at PAT appointment   Patient verbalized understanding of instructions that were given to them at the PAT appointment. Patient was also instructed that they will need to review over the PAT instructions again at home before surgery.

## 2018-10-21 NOTE — Progress Notes (Signed)
Anesthesia Chart Review:  Case:  [id number] Date/Time:  10/28/18 1045   Procedure:  Lumbar 3-4 Posterior lumbar interbody fusion with Infuse (N/A Back) - Lumbar 3-4 Posterior lumbar interbody fusion with Infuse   Anesthesia type:  General   Pre-op diagnosis:  Lumbar stenosis with neurogenic claudication   Location:  [facility] OR ROOM 20 / [facility]   Surgeon:  Kristeen Miss, MD      DISCUSSION:  73 yo male current smoker. Pertinent hx includes COPD, DOE, OSA not on cpap IDDMII.  Pt was seen 03/07/2018 by Dr. Lovena Le for preop risk assessment for TKA. Per his note the pt has multiple cardiac risk factors and a history of chest tightness with emotional stress. Prior cardiac cath with nonobstructive coronary disease. Due to pt being sedentary 2/2 knee pain, it was recommended he undergo nuclear stress.   Nuclear stress 03/18/2018 showed: Small size, mild intensity fixed apical perfusion defect, likely attenuation artifact. No reversible ischemia. LVEF 42% with moderate global hypokinesis. This is an intermediate risk study.  Pt was cleared by Dr. Lovena Le to undergo TKA.  It appears that the pt then developed progressive low back pain and LE weakness secondary to lumbar compression fracture and progressive spinal stenosis and is now scheduled for lumbar fusion.  Given recent cardiac clearance and nonischemic nuclear stress test, anticipate he can proceed as planned barring acute status change  VS: BP 120/69   Pulse 90   Temp 36.7 C (Oral)   Resp 20   Ht 5\' 5"  (1.651 m)   Wt 99.3 kg   SpO2 100%   BMI 36.43 kg/m   PROVIDERS: Carol Ada, MD is PCP  Cristopher Peru, MD is Cardiologist  LABS: Labs reviewed: Acceptable for surgery. (all labs ordered are listed, but only abnormal results are displayed)  Labs Reviewed  GLUCOSE, CAPILLARY - Abnormal; Notable for the following components:      Result Value   Glucose-Capillary 120 (*)    All other components within normal limits  BASIC METABOLIC PANEL -  Abnormal; Notable for the following components:   CO2 19 (*)    Anion gap 16 (*)    All other components within normal limits  HEMOGLOBIN A1C - Abnormal; Notable for the following components:   Hgb A1c MFr Bld 6.8 (*)    All other components within normal limits  CBC - Abnormal; Notable for the following components:   WBC 11.3 (*)    All other components within normal limits  SURGICAL PCR SCREEN  TYPE AND SCREEN  ABO/RH     IMAGES: TL Myelogram 09/29/2018: IMPRESSION: 1. Severe lower thoracic facet arthrosis with moderate neural foraminal stenosis on the left at T10-11 and on the right at T11-12. 2. Borderline spinal stenosis at T11-12 with a small right-sided synovial cyst contributing. 3. Advanced lumbar disc and facet degeneration, most notable at L3-4 where there is severe spinal stenosis and moderate bilateral neural foraminal stenosis. 4. Mild spinal stenosis and moderate to severe right neural foraminal stenosis at L4-5. 5. Mild spinal stenosis and moderate left neural foraminal stenosis at L1-2 and L2-3. 6.  Aortic Atherosclerosis (ICD10-I70.0).   EKG: 03/07/2018: NSR. RAD. Rate 85.  CV: Nuclear stress 03/18/2018:  Nuclear stress EF: 42%.  No T wave inversion was noted during stress.  There was no ST segment deviation noted during stress.  Defect 1: There is a small defect of mild severity.  This is an intermediate risk study.   Small size, mild intensity fixed apical perfusion  defect, likely attenuation artifact. No reversible ischemia. LVEF 42% with moderate global hypokinesis. This is an intermediate risk study.  Past Medical History:  Diagnosis Date  . Arthritis   . Coronary atherosclerosis of native coronary artery   . Hypogonadism male   . Obstructive chronic bronchitis with exacerbation ([place])   . Obstructive sleep apnea (adult) (pediatric)   . Other and unspecified hyperlipidemia   . Other emphysema ([place])   . Proteinuria 2019   has seen a  nephrologist  . Shortness of breath   . Type II or unspecified type diabetes mellitus without mention of complication, not stated as uncontrolled   . Unspecified essential hypertension     Past Surgical History:  Procedure Laterality Date  . CARDIAC CATHETERIZATION    . IR KYPHO LUMBAR INC FX REDUCE BONE BX UNI/BIL CANNULATION INC/IMAGING  06/13/2018  . L lens implant    . R knee replacement x2    . TONSILLECTOMY    . TONSILLECTOMY AND ADENOIDECTOMY    . vascectomy      MEDICATIONS: . atorvastatin (LIPITOR) 40 MG tablet  . gabapentin (NEURONTIN) 300 MG capsule  . insulin NPH-regular Human (NOVOLIN 70/30) (70-30) 100 UNIT/ML injection  . linaclotide (LINZESS) 145 MCG CAPS capsule  . lisinopril-hydrochlorothiazide (ZESTORETIC) 20-12.5 MG tablet  . magnesium oxide (MAG-OX) 400 MG tablet  . metFORMIN (GLUCOPHAGE) 1000 MG tablet  . naproxen (NAPROSYN) 500 MG tablet  . oxyCODONE (OXYCONTIN) 30 MG 12 hr tablet  . rOPINIRole (REQUIP) 0.5 MG tablet  . testosterone cypionate (DEPOTESTOSTERONE CYPIONATE) 200 MG/ML injection  . tiZANidine (ZANAFLEX) 4 MG tablet  . trolamine salicylate (ASPERCREME) 10 % cream   No current facility-administered medications for this encounter.      Wynonia Musty [facility] Short Stay Center/Anesthesiology Phone [phone] 10/21/2018 4:58 PM

## 2018-10-21 NOTE — Anesthesia Preprocedure Evaluation (Addendum)
Anesthesia Evaluation  Patient identified by MRN, date of birth, ID band Patient awake    Reviewed: Allergy & Precautions, NPO status , Patient's Chart, lab work & pertinent test results  Airway Mallampati: II  TM Distance: >3 FB     Dental  (+) Dental Advisory Given, Teeth Intact   Pulmonary shortness of breath, sleep apnea , COPD, Current Smoker,    breath sounds clear to auscultation       Cardiovascular hypertension, + CAD   Rhythm:Regular Rate:Normal     Neuro/Psych    GI/Hepatic Neg liver ROS, GERD  ,  Endo/Other  diabetes  Renal/GU      Musculoskeletal   Abdominal   Peds  Hematology   Anesthesia Other Findings   Reproductive/Obstetrics                          Anesthesia Physical Anesthesia Plan  ASA: III  Anesthesia Plan: General   Post-op Pain Management:    Induction: Intravenous  PONV Risk Score and Plan: Ondansetron  Airway Management Planned: Oral ETT  Additional Equipment:   Intra-op Plan:   Post-operative Plan:   Informed Consent: I have reviewed the patients History and Physical, chart, labs and discussed the procedure including the risks, benefits and alternatives for the proposed anesthesia with the patient or authorized representative who has indicated his/her understanding and acceptance.     Dental advisory given  Plan Discussed with: CRNA and Anesthesiologist  Anesthesia Plan Comments: (See PAT note by Karoline Caldwell, PA-C )       Anesthesia Quick Evaluation

## 2018-10-27 MED ORDER — VANCOMYCIN HCL 10 G IV SOLR
1500.0000 mg | INTRAVENOUS | Status: AC
  Administered 2018-10-28: 1500 mg via INTRAVENOUS
  Filled 2018-10-27: qty 1500

## 2018-10-28 ENCOUNTER — Inpatient Hospital Stay (HOSPITAL_COMMUNITY): Payer: PPO

## 2018-10-28 ENCOUNTER — Encounter (HOSPITAL_COMMUNITY): Payer: Self-pay | Admitting: *Deleted

## 2018-10-28 ENCOUNTER — Encounter (HOSPITAL_COMMUNITY)
Admission: RE | Disposition: A | Discharge status: Home / Self Care | Payer: Self-pay | Source: Home / Self Care | Attending: Neurological Surgery

## 2018-10-28 ENCOUNTER — Inpatient Hospital Stay (HOSPITAL_COMMUNITY)
Admission: RE | Admit: 2018-10-28 | Discharge: 2018-11-01 | DRG: 455 | Disposition: A | Discharge status: Home / Self Care | Payer: PPO | Attending: Neurological Surgery | Admitting: Neurological Surgery

## 2018-10-28 ENCOUNTER — Inpatient Hospital Stay (HOSPITAL_COMMUNITY): Payer: PPO | Admitting: Certified Registered Nurse Anesthetist

## 2018-10-28 ENCOUNTER — Inpatient Hospital Stay (HOSPITAL_COMMUNITY): Payer: PPO | Admitting: Physician Assistant

## 2018-10-28 DIAGNOSIS — M47816 Spondylosis without myelopathy or radiculopathy, lumbar region: Secondary | ICD-10-CM | POA: Diagnosis not present

## 2018-10-28 DIAGNOSIS — M4150 Other secondary scoliosis, site unspecified: Secondary | ICD-10-CM | POA: Diagnosis present

## 2018-10-28 DIAGNOSIS — F17210 Nicotine dependence, cigarettes, uncomplicated: Secondary | ICD-10-CM | POA: Diagnosis not present

## 2018-10-28 DIAGNOSIS — G4733 Obstructive sleep apnea (adult) (pediatric): Secondary | ICD-10-CM | POA: Diagnosis not present

## 2018-10-28 DIAGNOSIS — M4156 Other secondary scoliosis, lumbar region: Secondary | ICD-10-CM | POA: Diagnosis not present

## 2018-10-28 DIAGNOSIS — M5116 Intervertebral disc disorders with radiculopathy, lumbar region: Secondary | ICD-10-CM | POA: Diagnosis not present

## 2018-10-28 DIAGNOSIS — I2510 Atherosclerotic heart disease of native coronary artery without angina pectoris: Secondary | ICD-10-CM | POA: Diagnosis present

## 2018-10-28 DIAGNOSIS — Z91041 Radiographic dye allergy status: Secondary | ICD-10-CM

## 2018-10-28 DIAGNOSIS — Z880 Allergy status to penicillin: Secondary | ICD-10-CM | POA: Diagnosis not present

## 2018-10-28 DIAGNOSIS — Z6836 Body mass index (BMI) 36.0-36.9, adult: Secondary | ICD-10-CM

## 2018-10-28 DIAGNOSIS — Z09 Encounter for follow-up examination after completed treatment for conditions other than malignant neoplasm: Secondary | ICD-10-CM

## 2018-10-28 DIAGNOSIS — K5900 Constipation, unspecified: Secondary | ICD-10-CM | POA: Diagnosis not present

## 2018-10-28 DIAGNOSIS — Z96651 Presence of right artificial knee joint: Secondary | ICD-10-CM | POA: Diagnosis present

## 2018-10-28 DIAGNOSIS — M48062 Spinal stenosis, lumbar region with neurogenic claudication: Secondary | ICD-10-CM | POA: Diagnosis not present

## 2018-10-28 DIAGNOSIS — E785 Hyperlipidemia, unspecified: Secondary | ICD-10-CM | POA: Diagnosis present

## 2018-10-28 DIAGNOSIS — M48061 Spinal stenosis, lumbar region without neurogenic claudication: Secondary | ICD-10-CM | POA: Diagnosis not present

## 2018-10-28 DIAGNOSIS — Z825 Family history of asthma and other chronic lower respiratory diseases: Secondary | ICD-10-CM

## 2018-10-28 DIAGNOSIS — M4316 Spondylolisthesis, lumbar region: Secondary | ICD-10-CM | POA: Diagnosis present

## 2018-10-28 DIAGNOSIS — M4726 Other spondylosis with radiculopathy, lumbar region: Secondary | ICD-10-CM | POA: Diagnosis not present

## 2018-10-28 DIAGNOSIS — M5416 Radiculopathy, lumbar region: Secondary | ICD-10-CM | POA: Diagnosis not present

## 2018-10-28 DIAGNOSIS — Z981 Arthrodesis status: Secondary | ICD-10-CM | POA: Diagnosis not present

## 2018-10-28 DIAGNOSIS — J439 Emphysema, unspecified: Secondary | ICD-10-CM | POA: Diagnosis present

## 2018-10-28 DIAGNOSIS — Z794 Long term (current) use of insulin: Secondary | ICD-10-CM

## 2018-10-28 DIAGNOSIS — Z888 Allergy status to other drugs, medicaments and biological substances status: Secondary | ICD-10-CM | POA: Diagnosis not present

## 2018-10-28 DIAGNOSIS — E119 Type 2 diabetes mellitus without complications: Secondary | ICD-10-CM | POA: Diagnosis present

## 2018-10-28 DIAGNOSIS — Z8249 Family history of ischemic heart disease and other diseases of the circulatory system: Secondary | ICD-10-CM

## 2018-10-28 DIAGNOSIS — Z79899 Other long term (current) drug therapy: Secondary | ICD-10-CM | POA: Diagnosis not present

## 2018-10-28 DIAGNOSIS — Z87892 Personal history of anaphylaxis: Secondary | ICD-10-CM

## 2018-10-28 DIAGNOSIS — M1990 Unspecified osteoarthritis, unspecified site: Secondary | ICD-10-CM | POA: Diagnosis present

## 2018-10-28 DIAGNOSIS — I10 Essential (primary) hypertension: Secondary | ICD-10-CM | POA: Diagnosis not present

## 2018-10-28 DIAGNOSIS — Z419 Encounter for procedure for purposes other than remedying health state, unspecified: Secondary | ICD-10-CM

## 2018-10-28 DIAGNOSIS — Z91040 Latex allergy status: Secondary | ICD-10-CM

## 2018-10-28 DIAGNOSIS — Z91013 Allergy to seafood: Secondary | ICD-10-CM

## 2018-10-28 LAB — GLUCOSE, CAPILLARY
Glucose-Capillary: 91 mg/dL (ref 70–99)
Glucose-Capillary: 87 mg/dL (ref 70–99)
Glucose-Capillary: 79 mg/dL (ref 70–99)
Glucose-Capillary: 206 mg/dL — ABNORMAL HIGH (ref 70–99)

## 2018-10-28 SURGERY — POSTERIOR LUMBAR FUSION 1 LEVEL
Anesthesia: General | Site: Back

## 2018-10-28 MED ORDER — GABAPENTIN 300 MG PO CAPS
300.0000 mg | ORAL_CAPSULE | Freq: Three times a day (TID) | ORAL | Status: DC
  Administered 2018-10-28 – 2018-10-31 (×8): 300 mg via ORAL
  Filled 2018-10-28 (×8): qty 1

## 2018-10-28 MED ORDER — LIDOCAINE-EPINEPHRINE 1 %-1:100000 IJ SOLN
INTRAMUSCULAR | Status: DC | PRN
  Administered 2018-10-28: 10 mL

## 2018-10-28 MED ORDER — ATORVASTATIN CALCIUM 40 MG PO TABS
40.0000 mg | ORAL_TABLET | Freq: Every day | ORAL | Status: DC
  Administered 2018-10-28 – 2018-10-31 (×4): 40 mg via ORAL
  Filled 2018-10-28 (×4): qty 1

## 2018-10-28 MED ORDER — PROPOFOL 10 MG/ML IV BOLUS
INTRAVENOUS | Status: AC
  Filled 2018-10-28: qty 40

## 2018-10-28 MED ORDER — THROMBIN 5000 UNITS EX SOLR
OROMUCOSAL | Status: DC | PRN
  Administered 2018-10-28: 11:00:00 via TOPICAL

## 2018-10-28 MED ORDER — ACETAMINOPHEN 650 MG RE SUPP: 650.0000 mg | RECTAL | Status: DC | PRN

## 2018-10-28 MED ORDER — METFORMIN HCL 500 MG PO TABS: 1000.0000 mg | ORAL_TABLET | Freq: Two times a day (BID) | ORAL | Status: DC

## 2018-10-28 MED ORDER — POLYETHYLENE GLYCOL 3350 17 G PO PACK
17.0000 g | PACK | Freq: Every day | ORAL | Status: DC | PRN
  Administered 2018-10-31: 17 g via ORAL
  Filled 2018-10-28: qty 1

## 2018-10-28 MED ORDER — ONDANSETRON HCL 4 MG PO TABS: 4.0000 mg | ORAL_TABLET | Freq: Four times a day (QID) | ORAL | Status: DC | PRN

## 2018-10-28 MED ORDER — ROCURONIUM BROMIDE 50 MG/5ML IV SOSY
PREFILLED_SYRINGE | INTRAVENOUS | Status: AC
  Filled 2018-10-28: qty 5

## 2018-10-28 MED ORDER — OXYCODONE-ACETAMINOPHEN 5-325 MG PO TABS
1.0000 | ORAL_TABLET | ORAL | Status: DC | PRN
  Administered 2018-10-28 – 2018-11-01 (×16): 2 via ORAL
  Filled 2018-10-28 (×17): qty 2

## 2018-10-28 MED ORDER — 0.9 % SODIUM CHLORIDE (POUR BTL) OPTIME
TOPICAL | Status: DC | PRN
  Administered 2018-10-28: 1000 mL

## 2018-10-28 MED ORDER — PHENYLEPHRINE 40 MCG/ML (10ML) SYRINGE FOR IV PUSH (FOR BLOOD PRESSURE SUPPORT)
PREFILLED_SYRINGE | INTRAVENOUS | Status: DC | PRN
  Administered 2018-10-28 (×2): 120 ug via INTRAVENOUS

## 2018-10-28 MED ORDER — CHLORHEXIDINE GLUCONATE CLOTH 2 % EX PADS: 6.0000 | MEDICATED_PAD | Freq: Once | CUTANEOUS | Status: DC

## 2018-10-28 MED ORDER — SODIUM CHLORIDE 0.9 % IV SOLN
INTRAVENOUS | Status: DC | PRN
  Administered 2018-10-28: 25 ug/min via INTRAVENOUS

## 2018-10-28 MED ORDER — OXYCODONE HCL ER 15 MG PO T12A
30.0000 mg | EXTENDED_RELEASE_TABLET | Freq: Two times a day (BID) | ORAL | Status: DC
  Administered 2018-10-29 – 2018-10-31 (×6): 30 mg via ORAL
  Filled 2018-10-28 (×6): qty 2

## 2018-10-28 MED ORDER — PHENOL 1.4 % MT LIQD: 1.0000 | OROMUCOSAL | Status: DC | PRN

## 2018-10-28 MED ORDER — THROMBIN 5000 UNITS EX SOLR
CUTANEOUS | Status: AC
  Filled 2018-10-28: qty 5000

## 2018-10-28 MED ORDER — LACTATED RINGERS IV SOLN
INTRAVENOUS | Status: DC
  Administered 2018-10-28: 10:00:00 via INTRAVENOUS

## 2018-10-28 MED ORDER — SODIUM CHLORIDE 0.9% FLUSH: 3.0000 mL | INTRAVENOUS | Status: DC | PRN

## 2018-10-28 MED ORDER — ONDANSETRON HCL 4 MG/2ML IJ SOLN
INTRAMUSCULAR | Status: AC
  Filled 2018-10-28: qty 2

## 2018-10-28 MED ORDER — PROPOFOL 10 MG/ML IV BOLUS
INTRAVENOUS | Status: DC | PRN
  Administered 2018-10-28: 200 mg via INTRAVENOUS

## 2018-10-28 MED ORDER — FENTANYL CITRATE (PF) 250 MCG/5ML IJ SOLN
INTRAMUSCULAR | Status: AC
  Filled 2018-10-28: qty 5

## 2018-10-28 MED ORDER — FENTANYL CITRATE (PF) 100 MCG/2ML IJ SOLN
25.0000 ug | INTRAMUSCULAR | Status: DC | PRN
  Administered 2018-10-28 (×2): 50 ug via INTRAVENOUS

## 2018-10-28 MED ORDER — TIZANIDINE HCL 4 MG PO TABS
4.0000 mg | ORAL_TABLET | Freq: Three times a day (TID) | ORAL | Status: DC | PRN
  Administered 2018-10-30 – 2018-10-31 (×4): 4 mg via ORAL
  Filled 2018-10-28 (×4): qty 1

## 2018-10-28 MED ORDER — LIDOCAINE-EPINEPHRINE 1 %-1:100000 IJ SOLN
INTRAMUSCULAR | Status: AC
  Filled 2018-10-28: qty 1

## 2018-10-28 MED ORDER — MORPHINE SULFATE (PF) 4 MG/ML IV SOLN
4.0000 mg | INTRAVENOUS | Status: DC | PRN
  Administered 2018-10-29: 4 mg via INTRAVENOUS
  Filled 2018-10-28: qty 1

## 2018-10-28 MED ORDER — ACETAMINOPHEN 325 MG PO TABS: 650.0000 mg | ORAL_TABLET | ORAL | Status: DC | PRN

## 2018-10-28 MED ORDER — LINACLOTIDE 145 MCG PO CAPS
145.0000 ug | ORAL_CAPSULE | Freq: Every day | ORAL | Status: DC | PRN
  Administered 2018-10-31: 145 ug via ORAL
  Filled 2018-10-28 (×3): qty 1

## 2018-10-28 MED ORDER — LISINOPRIL-HYDROCHLOROTHIAZIDE 20-12.5 MG PO TABS: 1.0000 | ORAL_TABLET | Freq: Every day | ORAL | Status: DC

## 2018-10-28 MED ORDER — BUPIVACAINE HCL (PF) 0.5 % IJ SOLN
INTRAMUSCULAR | Status: DC | PRN
  Administered 2018-10-28: 25 mL

## 2018-10-28 MED ORDER — METHOCARBAMOL 500 MG PO TABS
ORAL_TABLET | ORAL | Status: AC
  Filled 2018-10-28: qty 1

## 2018-10-28 MED ORDER — METHOCARBAMOL 1000 MG/10ML IJ SOLN
500.0000 mg | Freq: Four times a day (QID) | INTRAVENOUS | Status: DC | PRN
  Filled 2018-10-28: qty 5

## 2018-10-28 MED ORDER — FENTANYL CITRATE (PF) 100 MCG/2ML IJ SOLN
INTRAMUSCULAR | Status: DC | PRN
  Administered 2018-10-28: 100 ug via INTRAVENOUS
  Administered 2018-10-28 (×4): 50 ug via INTRAVENOUS
  Administered 2018-10-28: 100 ug via INTRAVENOUS

## 2018-10-28 MED ORDER — ONDANSETRON HCL 4 MG/2ML IJ SOLN
INTRAMUSCULAR | Status: DC | PRN
  Administered 2018-10-28: 4 mg via INTRAVENOUS

## 2018-10-28 MED ORDER — DOCUSATE SODIUM 100 MG PO CAPS
100.0000 mg | ORAL_CAPSULE | Freq: Two times a day (BID) | ORAL | Status: DC
  Administered 2018-10-28 – 2018-11-01 (×8): 100 mg via ORAL
  Filled 2018-10-28 (×8): qty 1

## 2018-10-28 MED ORDER — LIDOCAINE 2% (20 MG/ML) 5 ML SYRINGE
INTRAMUSCULAR | Status: AC
  Filled 2018-10-28: qty 5

## 2018-10-28 MED ORDER — ALUM & MAG HYDROXIDE-SIMETH 200-200-20 MG/5ML PO SUSP: 30.0000 mL | Freq: Four times a day (QID) | ORAL | Status: DC | PRN

## 2018-10-28 MED ORDER — LISINOPRIL 20 MG PO TABS
20.0000 mg | ORAL_TABLET | Freq: Every day | ORAL | Status: DC
  Administered 2018-10-29 – 2018-11-01 (×4): 20 mg via ORAL
  Filled 2018-10-28 (×4): qty 1

## 2018-10-28 MED ORDER — ROPINIROLE HCL 1 MG PO TABS
0.5000 mg | ORAL_TABLET | Freq: Every evening | ORAL | Status: DC | PRN
  Administered 2018-10-31: 0.5 mg via ORAL
  Filled 2018-10-28: qty 1

## 2018-10-28 MED ORDER — BISACODYL 10 MG RE SUPP
10.0000 mg | Freq: Every day | RECTAL | Status: DC | PRN
  Administered 2018-10-31: 10 mg via RECTAL
  Filled 2018-10-28: qty 1

## 2018-10-28 MED ORDER — LIDOCAINE 2% (20 MG/ML) 5 ML SYRINGE
INTRAMUSCULAR | Status: DC | PRN
  Administered 2018-10-28: 60 mg via INTRAVENOUS

## 2018-10-28 MED ORDER — ROCURONIUM BROMIDE 10 MG/ML (PF) SYRINGE
PREFILLED_SYRINGE | INTRAVENOUS | Status: DC | PRN
  Administered 2018-10-28: 20 mg via INTRAVENOUS
  Administered 2018-10-28 (×3): 10 mg via INTRAVENOUS
  Administered 2018-10-28: 20 mg via INTRAVENOUS
  Administered 2018-10-28: 50 mg via INTRAVENOUS

## 2018-10-28 MED ORDER — OXYCODONE-ACETAMINOPHEN 5-325 MG PO TABS
ORAL_TABLET | ORAL | Status: AC
  Filled 2018-10-28: qty 2

## 2018-10-28 MED ORDER — FENTANYL CITRATE (PF) 100 MCG/2ML IJ SOLN
INTRAMUSCULAR | Status: AC
  Filled 2018-10-28: qty 2

## 2018-10-28 MED ORDER — SUGAMMADEX SODIUM 200 MG/2ML IV SOLN
INTRAVENOUS | Status: DC | PRN
  Administered 2018-10-28: 200 mg via INTRAVENOUS

## 2018-10-28 MED ORDER — HYDROCHLOROTHIAZIDE 12.5 MG PO CAPS
12.5000 mg | ORAL_CAPSULE | Freq: Every day | ORAL | Status: DC
  Administered 2018-10-29 – 2018-11-01 (×4): 12.5 mg via ORAL
  Filled 2018-10-28 (×6): qty 1

## 2018-10-28 MED ORDER — INSULIN ASPART 100 UNIT/ML ~~LOC~~ SOLN: 0.0000 [IU] | Freq: Three times a day (TID) | SUBCUTANEOUS | Status: DC

## 2018-10-28 MED ORDER — METHOCARBAMOL 500 MG PO TABS
500.0000 mg | ORAL_TABLET | Freq: Four times a day (QID) | ORAL | Status: DC | PRN
  Administered 2018-10-28 – 2018-10-31 (×5): 500 mg via ORAL
  Filled 2018-10-28 (×4): qty 1

## 2018-10-28 MED ORDER — ONDANSETRON HCL 4 MG/2ML IJ SOLN: 4.0000 mg | Freq: Four times a day (QID) | INTRAMUSCULAR | Status: DC | PRN

## 2018-10-28 MED ORDER — LACTATED RINGERS IV SOLN
INTRAVENOUS | Status: DC | PRN
  Administered 2018-10-28 (×2): via INTRAVENOUS

## 2018-10-28 MED ORDER — SODIUM CHLORIDE 0.9 % IV SOLN
INTRAVENOUS | Status: DC | PRN
  Administered 2018-10-28: 11:00:00

## 2018-10-28 MED ORDER — DEXAMETHASONE SODIUM PHOSPHATE 10 MG/ML IJ SOLN
INTRAMUSCULAR | Status: AC
  Filled 2018-10-28: qty 1

## 2018-10-28 MED ORDER — KETOROLAC TROMETHAMINE 15 MG/ML IJ SOLN
INTRAMUSCULAR | Status: AC
  Filled 2018-10-28: qty 1

## 2018-10-28 MED ORDER — BUPIVACAINE HCL (PF) 0.5 % IJ SOLN
INTRAMUSCULAR | Status: AC
  Filled 2018-10-28: qty 30

## 2018-10-28 MED ORDER — VANCOMYCIN HCL IN DEXTROSE 1-5 GM/200ML-% IV SOLN
1000.0000 mg | Freq: Once | INTRAVENOUS | Status: AC
  Administered 2018-10-28: 1000 mg via INTRAVENOUS
  Filled 2018-10-28: qty 200

## 2018-10-28 MED ORDER — MENTHOL 3 MG MT LOZG: 1.0000 | LOZENGE | OROMUCOSAL | Status: DC | PRN

## 2018-10-28 MED ORDER — MAGNESIUM OXIDE 400 (241.3 MG) MG PO TABS
400.0000 mg | ORAL_TABLET | Freq: Every day | ORAL | Status: DC | PRN
  Administered 2018-10-31: 400 mg via ORAL
  Filled 2018-10-28: qty 1

## 2018-10-28 MED ORDER — SODIUM CHLORIDE 0.9% FLUSH
3.0000 mL | Freq: Two times a day (BID) | INTRAVENOUS | Status: DC
  Administered 2018-10-29 – 2018-10-30 (×2): 3 mL via INTRAVENOUS

## 2018-10-28 MED ORDER — KETOROLAC TROMETHAMINE 15 MG/ML IJ SOLN
7.5000 mg | Freq: Four times a day (QID) | INTRAMUSCULAR | Status: AC
  Administered 2018-10-28 – 2018-10-29 (×4): 7.5 mg via INTRAVENOUS
  Filled 2018-10-28 (×3): qty 1

## 2018-10-28 MED ORDER — SENNA 8.6 MG PO TABS
1.0000 | ORAL_TABLET | Freq: Two times a day (BID) | ORAL | Status: DC
  Administered 2018-10-28 – 2018-11-01 (×8): 8.6 mg via ORAL
  Filled 2018-10-28 (×8): qty 1

## 2018-10-28 MED ORDER — SODIUM CHLORIDE 0.9 % IV SOLN: 250.0000 mL | INTRAVENOUS | Status: DC

## 2018-10-28 MED ORDER — FLEET ENEMA 7-19 GM/118ML RE ENEM: 1.0000 | ENEMA | Freq: Once | RECTAL | Status: DC | PRN

## 2018-10-28 MED ORDER — LACTATED RINGERS IV SOLN
INTRAVENOUS | Status: DC
  Administered 2018-10-28 – 2018-10-29 (×3): via INTRAVENOUS

## 2018-10-28 SURGICAL SUPPLY — 66 items
BAG DECANTER FOR FLEXI CONT (MISCELLANEOUS) ×2 IMPLANT
BASKET BONE COLLECTION (BASKET) ×2 IMPLANT
BLADE CLIPPER SURG (BLADE) IMPLANT
BONE CANC CHIPS 20CC PCAN1/4 (Bone Implant) ×2 IMPLANT
BUR MATCHSTICK NEURO 3.0 LAGG (BURR) ×2 IMPLANT
CAGE COROENT 10X9X23 (Cage) ×2 IMPLANT
CANISTER SUCT 3000ML PPV (MISCELLANEOUS) ×2 IMPLANT
CHIPS CANC BONE 20CC PCAN1/4 (Bone Implant) ×1 IMPLANT
CONT SPEC 4OZ CLIKSEAL STRL BL (MISCELLANEOUS) ×2 IMPLANT
COVER BACK TABLE 60X90IN (DRAPES) ×2 IMPLANT
COVER WAND RF STERILE (DRAPES) ×2 IMPLANT
DECANTER SPIKE VIAL GLASS SM (MISCELLANEOUS) ×2 IMPLANT
DERMABOND ADVANCED (GAUZE/BANDAGES/DRESSINGS) ×1
DERMABOND ADVANCED .7 DNX12 (GAUZE/BANDAGES/DRESSINGS) ×1 IMPLANT
DEVICE DISSECT PLASMABLAD 3.0S (MISCELLANEOUS) ×1 IMPLANT
DRAPE C-ARM 42X72 X-RAY (DRAPES) ×4 IMPLANT
DRAPE HALF SHEET 40X57 (DRAPES) IMPLANT
DRAPE LAPAROTOMY 100X72X124 (DRAPES) ×2 IMPLANT
DRSG OPSITE POSTOP 4X6 (GAUZE/BANDAGES/DRESSINGS) ×2 IMPLANT
DURAPREP 26ML APPLICATOR (WOUND CARE) ×2 IMPLANT
DURASEAL APPLICATOR TIP (TIP) IMPLANT
DURASEAL SPINE SEALANT 3ML (MISCELLANEOUS) IMPLANT
ELECT REM PT RETURN 9FT ADLT (ELECTROSURGICAL) ×2
ELECTRODE REM PT RTRN 9FT ADLT (ELECTROSURGICAL) ×1 IMPLANT
GAUZE 4X4 16PLY RFD (DISPOSABLE) IMPLANT
GAUZE SPONGE 4X4 12PLY STRL (GAUZE/BANDAGES/DRESSINGS) ×2 IMPLANT
GLOVE BIOGEL PI IND STRL 8.5 (GLOVE) IMPLANT
GLOVE BIOGEL PI INDICATOR 8.5 (GLOVE)
GLOVE ECLIPSE 8.5 STRL (GLOVE) ×4 IMPLANT
GLOVE SS PI 9.0 STRL (GLOVE) ×2 IMPLANT
GLOVE SURG SS PI 8.0 STRL IVOR (GLOVE) ×6 IMPLANT
GLOVE SURG SS PI 8.5 STRL IVOR (GLOVE) ×2
GLOVE SURG SS PI 8.5 STRL STRW (GLOVE) ×2 IMPLANT
GOWN STRL REUS W/ TWL LRG LVL3 (GOWN DISPOSABLE) ×1 IMPLANT
GOWN STRL REUS W/ TWL XL LVL3 (GOWN DISPOSABLE) ×1 IMPLANT
GOWN STRL REUS W/TWL 2XL LVL3 (GOWN DISPOSABLE) ×4 IMPLANT
GOWN STRL REUS W/TWL LRG LVL3 (GOWN DISPOSABLE) ×1
GOWN STRL REUS W/TWL XL LVL3 (GOWN DISPOSABLE) ×1
HEMOSTAT POWDER KIT SURGIFOAM (HEMOSTASIS) ×2 IMPLANT
KIT BASIN OR (CUSTOM PROCEDURE TRAY) ×2 IMPLANT
KIT INFUSE SMALL (Orthopedic Implant) ×2 IMPLANT
KIT TURNOVER KIT B (KITS) ×2 IMPLANT
MILL MEDIUM DISP (BLADE) ×2 IMPLANT
NEEDLE HYPO 22GX1.5 SAFETY (NEEDLE) ×2 IMPLANT
NS IRRIG 1000ML POUR BTL (IV SOLUTION) ×2 IMPLANT
PACK LAMINECTOMY NEURO (CUSTOM PROCEDURE TRAY) ×2 IMPLANT
PAD ARMBOARD 7.5X6 YLW CONV (MISCELLANEOUS) ×6 IMPLANT
PATTIES SURGICAL .5 X1 (DISPOSABLE) ×2 IMPLANT
PLASMABLADE 3.0S (MISCELLANEOUS) ×2
ROD RELINE-O LORD 5.5X40 (Rod) ×4 IMPLANT
SCREW LOCK RELINE 5.5 TULIP (Screw) ×8 IMPLANT
SCREW RELINE-O POLY 6.5X45 (Screw) ×8 IMPLANT
SPONGE LAP 4X18 RFD (DISPOSABLE) IMPLANT
SPONGE SURGIFOAM ABS GEL 100 (HEMOSTASIS) IMPLANT
SUT PROLENE 6 0 BV (SUTURE) IMPLANT
SUT VIC AB 1 CT1 18XBRD ANBCTR (SUTURE) ×1 IMPLANT
SUT VIC AB 1 CT1 8-18 (SUTURE) ×1
SUT VIC AB 2-0 CP2 18 (SUTURE) ×2 IMPLANT
SUT VIC AB 3-0 SH 8-18 (SUTURE) ×4 IMPLANT
SYR 3ML LL SCALE MARK (SYRINGE) ×8 IMPLANT
TOWEL GREEN STERILE (TOWEL DISPOSABLE) ×2 IMPLANT
TOWEL GREEN STERILE FF (TOWEL DISPOSABLE) ×2 IMPLANT
TRAY FOL W/BAG SLVR 16FR STRL (SET/KITS/TRAYS/PACK) ×1 IMPLANT
TRAY FOLEY MTR SLVR 16FR STAT (SET/KITS/TRAYS/PACK) IMPLANT
TRAY FOLEY W/BAG SLVR 16FR LF (SET/KITS/TRAYS/PACK) ×1
WATER STERILE IRR 1000ML POUR (IV SOLUTION) ×2 IMPLANT

## 2018-10-28 NOTE — Progress Notes (Signed)
Pharmacy Antibiotic Note  EMAURI KRYGIER is a 73 y.o. male admitted on 10/28/2018 for planned spinal surgery.  Pharmacy has been consulted for Vancomycin dosing post-op.  Pre-op dose of 1500 mg given at 0951 today. Per discussion with RN, there is NO drain in place.   Plan: - Vancomycin 1g IV x 1 dose post-op at 2200 - Pharmacy will sign off as no further doses are expected at this time  Height: 5\' 5"  (165.1 cm) Weight: 220 lb (99.8 kg) IBW/kg (Calculated) : 61.5  Temp (24hrs), Avg:97.9 F (36.6 C), Min:97.8 F (36.6 C), Max:98 F (36.7 C)  No results for input(s): WBC, CREATININE, LATICACIDVEN, VANCOTROUGH, VANCOPEAK, VANCORANDOM, GENTTROUGH, GENTPEAK, GENTRANDOM, TOBRATROUGH, TOBRAPEAK, TOBRARND, AMIKACINPEAK, AMIKACINTROU, AMIKACIN in the last 168 hours.  Estimated Creatinine Clearance: 69.4 mL/min (by C-G formula based on SCr of 1.03 mg/dL).    Allergies  Allergen Reactions  . Iodinated Diagnostic Agents Anaphylaxis  . Moxifloxacin Swelling    REACTION: GI upset, throat "tightened up"  . Penicillins Anaphylaxis  . Shellfish-Derived Products Anaphylaxis  . Latex Rash    Over long periods of time    Alycia Rossetti, PharmD, BCPS Pager: [phone] 7:25 PM

## 2018-10-28 NOTE — Anesthesia Procedure Notes (Signed)
Procedure Name: Intubation Date/Time: 10/28/2018 12:32 PM Performed by: Leonor Liv, CRNA Pre-anesthesia Checklist: Patient identified, Emergency Drugs available, Suction available and Patient being monitored Patient Re-evaluated:Patient Re-evaluated prior to induction Oxygen Delivery Method: Circle System Utilized Preoxygenation: Pre-oxygenation with 100% oxygen Induction Type: IV induction Ventilation: Mask ventilation without difficulty Laryngoscope Size: Mac and 4 Grade View: Grade II Tube type: Oral Tube size: 7.5 mm Number of attempts: 1 Airway Equipment and Method: Stylet and Oral airway Placement Confirmation: ETT inserted through vocal cords under direct vision,  positive ETCO2 and breath sounds checked- equal and bilateral Secured at: 23 cm Tube secured with: Tape Dental Injury: Teeth and Oropharynx as per pre-operative assessment

## 2018-10-28 NOTE — Transfer of Care (Signed)
Immediate Anesthesia Transfer of Care Note  Patient: Justin Trujillo  Procedure(s) Performed: Lumbar three-four  Posterior lumbar interbody fusion with Infuse (N/A Back)  Patient Location: PACU  Anesthesia Type:General  Level of Consciousness: awake, alert  and oriented  Airway & Oxygen Therapy: Patient Spontanous Breathing and Patient connected to nasal cannula oxygen  Post-op Assessment: Report given to RN, Post -op Vital signs reviewed and stable and Patient moving all extremities  Post vital signs: Reviewed and stable  Last Vitals:  Vitals Value Taken Time  BP 118/79 10/28/2018  4:07 PM  Temp    Pulse 87 10/28/2018  4:10 PM  Resp 13 10/28/2018  4:10 PM  SpO2 98 % 10/28/2018  4:10 PM  Vitals shown include unvalidated device data.  Last Pain:  Vitals:   10/28/18 0941  TempSrc:   PainSc: 7          Complications: No apparent anesthesia complications

## 2018-10-28 NOTE — H&P (Signed)
Justin Trujillo is an 73 y.o. male.   Chief Complaint: back bilateral leg pain and weakness HPI: Justin Trujillo is a 73 year old individual who has had significant back and bilateral leg pain with weakness.  He was seen and evaluated and having undergone considerable efforts at conservative treatment myelogram was performed.  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  Is been advised regarding the need for surgical decompression and stabilization at the level of L3-L4.  He is now admitted for that procedure.  Past Medical History:  Diagnosis Date  . Arthritis   . Coronary atherosclerosis of native coronary artery   . Hypogonadism male   . Obstructive chronic bronchitis with exacerbation ([place])   . Obstructive sleep apnea (adult) (pediatric)   . Other and unspecified hyperlipidemia   . Other emphysema ([place])   . Proteinuria 2019   has seen a nephrologist  . Shortness of breath   . Type II or unspecified type diabetes mellitus without mention of complication, not stated as uncontrolled   . Unspecified essential hypertension     Past Surgical History:  Procedure Laterality Date  . CARDIAC CATHETERIZATION    . IR KYPHO LUMBAR INC FX REDUCE BONE BX UNI/BIL CANNULATION INC/IMAGING  06/13/2018  . L lens implant    . R knee replacement x2    . TONSILLECTOMY    . TONSILLECTOMY AND ADENOIDECTOMY    . vascectomy      Family History  Problem Relation Age of Onset  . Emphysema Father   . Heart disease Father   . Clotting disorder Mother    Social History:  reports that he has been smoking cigarettes. He has been smoking about 0.50 packs per day. He has never used smokeless tobacco. He reports that he does not drink alcohol or use drugs.  Allergies:  Allergies  Allergen Reactions  . Iodinated Diagnostic Agents Anaphylaxis  . Moxifloxacin Swelling    REACTION: GI upset, throat "tightened up"  . Penicillins Anaphylaxis   . Shellfish-Derived Products Anaphylaxis  . Latex Rash    Over long periods of time    Medications Prior to Admission  Medication Sig Dispense Refill  . atorvastatin (LIPITOR) 40 MG tablet Take 1 tablet (40 mg total) by mouth daily. 30 tablet 0  . gabapentin (NEURONTIN) 300 MG capsule Take 300 mg by mouth 3 (three) times daily.    Marland Kitchen linaclotide (LINZESS) 145 MCG CAPS capsule Take 145-290 mcg by mouth daily as needed (constipation).     Marland Kitchen lisinopril-hydrochlorothiazide (ZESTORETIC) 20-12.5 MG tablet Take 1 tablet by mouth daily.     . magnesium oxide (MAG-OX) 400 MG tablet Take 400-1,200 mg by mouth daily as needed (cramps).    . metFORMIN (GLUCOPHAGE) 1000 MG tablet Take 1 tablet (1,000 mg total) by mouth 2 (two) times daily with a meal. 60 tablet 0  . naproxen (NAPROSYN) 500 MG tablet Take 500 mg by mouth daily as needed for moderate pain.     Marland Kitchen oxyCODONE (OXYCONTIN) 30 MG 12 hr tablet Take 30 mg by mouth 2 (two) times daily.    Marland Kitchen rOPINIRole (REQUIP) 0.5 MG tablet Take 0.5 mg by mouth at bedtime as needed (LEG CRAMPING).    Marland Kitchen testosterone cypionate (DEPOTESTOSTERONE CYPIONATE) 200 MG/ML injection Inject 200 mg into the muscle every 14 (fourteen) days.     Marland Kitchen tiZANidine (ZANAFLEX) 4 MG tablet Take 4 mg by mouth 3 (three) times daily  as needed for muscle spasms.   0  . trolamine salicylate (ASPERCREME) 10 % cream Apply 1 application topically as needed for muscle pain.    Marland Kitchen insulin NPH-regular Human (NOVOLIN 70/30) (70-30) 100 UNIT/ML injection Inject 10-15 Units into the skin daily as needed (ONLY WHEN FASTING BLOOD SUGAR 150 OR MORE).       Results for orders placed or performed during the hospital encounter of 10/28/18 (from the past 48 hour(s))  Glucose, capillary     Status: None   Collection Time: 10/28/18  9:10 AM  Result Value Ref Range   Glucose-Capillary 91 70 - 99 mg/dL   Comment 1 Notify RN    Comment 2 Document in Chart    No results found.  Review of Systems   Constitutional: Negative.   HENT: Negative.   Eyes: Negative.   Respiratory: Negative.   Cardiovascular: Negative.   Gastrointestinal: Negative.   Genitourinary: Negative.   Musculoskeletal: Negative.        Extreme back pain with leg weakness  Skin: Negative.   Neurological: Positive for tingling and weakness.  Endo/Heme/Allergies: Negative.   Psychiatric/Behavioral: Negative.     Blood pressure 132/81, pulse 85, temperature 97.8 F (36.6 C), temperature source Oral, resp. rate 18, height 5\' 5"  (1.651 m), weight 99.8 kg, SpO2 95 %. Physical Exam  Constitutional: He is oriented to person, place, and time. He appears well-developed and well-nourished.  Moderately obese  HENT:  Head: Normocephalic and atraumatic.  Eyes: Pupils are equal, round, and reactive to light. Conjunctivae and EOM are normal.  Neck: Normal range of motion. Neck supple.  Cardiovascular: Normal rate and regular rhythm.  Respiratory: Effort normal and breath sounds normal.  GI: Soft. Bowel sounds are normal.  Musculoskeletal:     Comments: Positive straight leg raising bilaterally.  Patrick's maneuver is negative bilaterally.  Straight leg raising positive at 15  Neurological: He is alert and oriented to person, place, and time.  Weakness in both tibialis anterior gastrocs at 4 out of 5 weakness in both quadriceps at 4-5 absent reflexes in patella and Achilles.  Cranial nerve examination upper strength examination is within limits of normal.  Patient walks with a wide-based gait and significant antalgia.  Skin: Skin is warm and dry.  Psychiatric: He has a normal mood and affect. His behavior is normal. Judgment and thought content normal.     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  Plan: Patient is admitted to undergo surgical decompression and stabilization at L3-L4.  Earleen Newport, MD 10/28/2018, 10:22 AM

## 2018-10-28 NOTE — Op Note (Signed)
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  Degenerative scoliosis Postoperative diagnosis: Same Procedure: Bilateral laminectomy L3-L4 with decompression of the L3 and L4 nerve roots.  Posterior lumbar interbody arthrodesis L3-L4 with peek spacer local autograft allograft and infuse.  Pedicle screw fixation L3-L4 with posterior lateral arthrodesis L3-L4. Surgeon: Kristeen Miss First assistant: Deri Fuelling, MD Anesthesia: General endotracheal Indications: Justin Trujillo is a 73 year old individuals had significant back and substantial leg pain with high-grade stenosis due to a centrally herniated disc at L3-L4 there is advanced spondylosis with a degenerative scoliosis also that is present.  He was advised regarding the need for surgical intervention.  Procedure: Patient was brought to the operating room supine on a stretcher.  After the smooth induction of general endotracheal anesthesia he was carefully turned prone.  The bony prominences were appropriately padded and protected.  The back was prepped with alcohol DuraPrep and draped in a sterile fashion.  A midline incision was created in the center of the low back and carried down to the lumbodorsal fascia.  For spinous processes were noted to be that of L3 and L4 on the radiograph.  In the interlaminar spaces were cleared in the subperiosteal fashion and this allowed placement of a McCullough retractor.  The facets at L3-L4 were uncovered and a transverse process at L2-3 and L4 were decorticated and packed away for  later use in grafting.  Laminectomy was then created removing the inferior margin lamina of L3 out to and including the entirety of the facet at L3-4.  This was done on the right side and then on the left side.  There is noted to be a fair amount of rotational force between L3 and L4.  The right side was close down  completely.  Carefully the thickened redundant yellow ligament and hypertrophied facet was taken down to expose the common dural tube and the takeoff of the L3 nerve root superiorly and the L4 nerve root inferiorly this was first done on the right side and then on done on the left side.  On the left side then a discectomy was performed at L3-4 removing a substantial quantity of severely degenerated disc material the side was much more open than the other side.  Ultimately the right side had a discectomy performed and the side was decorticated both medially and laterally superiorly and inferiorly.  A self-retaining disc spreader placed on one side and then the other allowed opening of the space to a 9 mm height ultimately a 10 mm long by 9 mm wide and 23 mm long 4 degree lordotic spacer was placed into the interspace.  This was packed with autograft allograft and infuse.  This was done only on the right side the left side was wide open and here a total of 9 cc of bone graft was packed into the interspace both medially and laterally after decorticating the bony endplates.  Once the bone was packed pedicle entry sites were chosen at L3 and L4 and 6.5 x 45 mm pedicle screws were placed into each of the 4 pedicles.  The lateral gutters were then packed with bone graft particularly on the right side.  Total of 6 cc on the right was packed 3 cc on the left.  Then short precontoured 40 mm rods were used to connect the pedicle screws a bit of compression was placed on the left-sided screws.  This reduced the degree of rotatory component  in the scoliosis that was present.  With this the system was locked down to final position final radiographs were obtained.  Hemostasis in the soft tissues was obtained meticulously and care was taken to make sure that the L3 and the L4 nerve roots bilaterally were decompressed.  Lumbodorsal fascia was then closed and then a total of 25 cc of half percent Marcaine was injected into the  paraspinous fascia.  The subcutaneous tissue was closed with 2-0 Vicryl and 3-0 Vicryl was used subcuticularly.  Dermabond was placed on the skin.

## 2018-10-28 NOTE — Anesthesia Postprocedure Evaluation (Signed)
Anesthesia Post Note  Patient: Justin Trujillo  Procedure(s) Performed: Lumbar three-four  Posterior lumbar interbody fusion with Infuse (N/A Back)     Patient location during evaluation: PACU Anesthesia Type: General Level of consciousness: awake Pain management: pain level controlled Vital Signs Assessment: post-procedure vital signs reviewed and stable Respiratory status: spontaneous breathing Cardiovascular status: stable Postop Assessment: no apparent nausea or vomiting Anesthetic complications: no    Last Vitals:  Vitals:   10/28/18 1715 10/28/18 1737  BP:  (!) 156/86  Pulse:  87  Resp:  18  Temp: 36.7 C 36.7 C  SpO2:      Last Pain:  Vitals:   10/28/18 1737  TempSrc: Oral  PainSc: 4                  [name]

## 2018-10-28 NOTE — Progress Notes (Signed)
Patient ID: Justin Trujillo, male   DOB: 11/13/1945, 73 y.o.   MRN: [id number] Vital signs are stable Patient complains of moderate back pain Motor function appears intact Dressing is clean and dry Stable

## 2018-10-29 ENCOUNTER — Other Ambulatory Visit: Payer: Self-pay

## 2018-10-29 LAB — GLUCOSE, CAPILLARY
Glucose-Capillary: 96 mg/dL (ref 70–99)
Glucose-Capillary: 133 mg/dL — ABNORMAL HIGH (ref 70–99)
Glucose-Capillary: 119 mg/dL — ABNORMAL HIGH (ref 70–99)
Glucose-Capillary: 163 mg/dL — ABNORMAL HIGH (ref 70–99)

## 2018-10-29 LAB — BASIC METABOLIC PANEL
Anion gap: 7 (ref 5–15)
BUN: 15 mg/dL (ref 8–23)
CO2: 29 mmol/L (ref 22–32)
Calcium: 8.9 mg/dL (ref 8.9–10.3)
Chloride: 102 mmol/L (ref 98–111)
Creatinine, Ser: 0.92 mg/dL (ref 0.61–1.24)
GFR calc Af Amer: >60 mL/min (ref >60)
GFR calc non Af Amer: >60 mL/min (ref >60)
Glucose, Bld: 99 mg/dL (ref 70–99)
POTASSIUM: 4 mmol/L (ref 3.5–5.1)
Sodium: 138 mmol/L (ref 135–145)

## 2018-10-29 LAB — CBC
HCT: 36.2 % — ABNORMAL LOW (ref 39.0–52.0)
Hemoglobin: 11.6 g/dL — ABNORMAL LOW (ref 13.0–17.0)
MCH: 28.8 pg (ref 26.0–34.0)
MCHC: 32 g/dL (ref 30.0–36.0)
MCV: 89.8 fL (ref 80.0–100.0)
Platelets: 159 10*3/uL (ref 150–400)
RBC: 4.03 MIL/uL — ABNORMAL LOW (ref 4.22–5.81)
RDW: 12.7 % (ref 11.5–15.5)
WBC: 10.3 10*3/uL (ref 4.0–10.5)
nRBC: 0 % (ref 0.0–0.2)

## 2018-10-29 NOTE — Evaluation (Signed)
Physical Therapy Evaluation Patient Details Name: Justin Trujillo MRN: [id number] DOB: June 30, 1946 Today's Date: 10/29/2018   History of Present Illness  Justin Trujillo is a 73 year old individual who has had significant back and bilateral leg pain with weakness.  He was seen and evaluated and having undergone considerable efforts at conservative treatment myelogram was performed.  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  Is been advised regarding the need for surgical decompression and stabilization at the level of L3-L4.  He is now admitted for that procedure.     Clinical Impression  Patient is s/p above surgery resulting in functional limitations due to the deficits listed below (see PT Problem List). Patient agreeable to participating in PT evaluation. Wife present throughout session. Patient slow to roll and transition from supine to sitting, each with use of bedrail. Patient required an elevated surface for sit to stand transfers with min assist. Patient was able to transfer stand to sit to standard height chair with min assist using UEs on armrests. Patient ambulated with RW in a trunk flexed position and short equal length steps with complaints of bilateral calf tightness. Patient does have 5 stairs with left handrail to enter his home. He does have an alternate entrance with 2 stairs but would need to be able to walk a further distance to reach that set of stairs to enter house. Patient needs to practice stairs next session if able.  Patient will benefit from skilled PT to increase their independence and safety with mobility to allow discharge to the venue listed below.       Follow Up Recommendations SNF;Supervision for mobility/OOB;Home health PT    Equipment Recommendations  None recommended by PT    Recommendations for Other Services       Precautions / Restrictions Precautions Precautions:  Fall Restrictions Weight Bearing Restrictions: No      Mobility  Bed Mobility Overal bed mobility: Needs Assistance Bed Mobility: Rolling;Sidelying to Sit Rolling: Modified independent (Device/Increase time) Sidelying to sit: Min guard       General bed mobility comments: utilized bed rail to push up; extra time  Transfers Overall transfer level: Needs assistance Equipment used: Rolling walker (2 wheeled) Transfers: Sit to/from [facility] Sit to Stand: Min assist;From elevated surface Stand pivot transfers: Min guard       General transfer comment: unable to stand from standard height bed  Ambulation/Gait Ambulation/Gait assistance: Min guard Gait Distance (Feet): 20 Feet Assistive device: Rolling walker (2 wheeled) Gait Pattern/deviations: Step-through pattern;Decreased step length - right;Decreased step length - left;Decreased stride length;Trunk flexed;Antalgic Gait velocity: decreased   General Gait Details: limited by pain, weakness and fatigue; complained of tightness in bilateral calves upon standing; wife reported patient standing and walking up straighter than prior to surgery.  Stairs            Wheelchair Mobility    Modified Rankin (Stroke Patients Only)       Balance Overall balance assessment: Needs assistance Sitting-balance support: Bilateral upper extremity supported;Feet supported Sitting balance-Leahy Scale: Fair     Standing balance support: Bilateral upper extremity supported;During functional activity Standing balance-Leahy Scale: Fair                               Pertinent Vitals/Pain Pain Assessment: 0-10 Pain Score: 4  Pain Location: low back  Pain Descriptors /  Indicators: Aching Pain Intervention(s): Limited activity within patient's tolerance;Monitored during session;Repositioned    Home Living Family/patient expects to be discharged to:: Private residence Living Arrangements:  Spouse/significant other Available Help at Discharge: Family;Available 24 hours/day Type of Home: House Home Access: Stairs to enter Entrance Stairs-Rails: Left Entrance Stairs-Number of Steps: 5 Home Layout: Two level;Able to live on main level with bedroom/bathroom Home Equipment: Shower seat - built in;Shower seat;Bedside commode;Walker - 4 wheels;Walker - standard;Crutches;Cane - single point;Grab bars - tub/shower;Hand held shower head      Prior Function Level of Independence: Needs assistance   Gait / Transfers Assistance Needed: could stnad 3-5 minutes limited by pain in low back and legs; some weakness; has walked with Rollator for last 8 months prior to surgery.   ADL's / Homemaking Assistance Needed: a little help washing back        Hand Dominance   Dominant Hand: Left    Extremity/Trunk Assessment   Upper Extremity Assessment Upper Extremity Assessment: Overall WFL for tasks assessed    Lower Extremity Assessment Lower Extremity Assessment: Generalized weakness    Cervical / Trunk Assessment Cervical / Trunk Assessment: Kyphotic  Communication   Communication: No difficulties  Cognition Arousal/Alertness: Awake/alert Behavior During Therapy: WFL for tasks assessed/performed Overall Cognitive Status: Within Functional Limits for tasks assessed                                        General Comments      Exercises     Assessment/Plan    PT Assessment Patient needs continued PT services  PT Problem List Decreased strength;Decreased mobility;Decreased activity tolerance;Decreased balance;Pain;Decreased knowledge of use of DME       PT Treatment Interventions DME instruction;Therapeutic activities;Therapeutic exercise;Gait training;Patient/family education;Stair training;Balance training    PT Goals (Current goals can be found in the Care Plan section)  Acute Rehab PT Goals Patient Stated Goal: Go home today or tomorrow. PT Goal  Formulation: With patient/family Time For Goal Achievement: 11/12/18 Potential to Achieve Goals: Fair    Frequency Min 5X/week   Barriers to discharge        Co-evaluation               AM-PAC PT "6 Clicks" Mobility  Outcome Measure Help needed turning from your back to your side while in a flat bed without using bedrails?: A Little Help needed moving from lying on your back to sitting on the side of a flat bed without using bedrails?: A Lot Help needed moving to and from a bed to a chair (including a wheelchair)?: A Lot Help needed standing up from a chair using your arms (e.g., wheelchair or bedside chair)?: A Lot Help needed to walk in hospital room?: A Little Help needed climbing 3-5 steps with a railing? : A Lot 6 Click Score: 14    End of Session Equipment Utilized During Treatment: Gait belt Activity Tolerance: Patient tolerated treatment well;Patient limited by pain;Patient limited by fatigue Patient left: in chair;with family/visitor present;with call bell/phone within reach Nurse Communication: Mobility status PT Visit Diagnosis: Muscle weakness (generalized) (M62.81);Other abnormalities of gait and mobility (R26.89);Unsteadiness on feet (R26.81);Difficulty in walking, not elsewhere classified (R26.2)    Time: 7124-5809 PT Time Calculation (min) (ACUTE ONLY): 30 min   Charges:   PT Evaluation $PT Eval Moderate Complexity: 1 Mod PT Treatments $Gait Training: 8-22 mins  Floria Raveling. Hartnett-Rands, MS, PT Per [facility] #[id number] 10/29/2018, 12:11 PM

## 2018-10-29 NOTE — Care Management Note (Signed)
Case Management Note  Patient Details  Name: Justin Trujillo MRN: [id number] Date of Birth: 12-Jul-1946  Subjective/Objective:       Pt s/p lumbar surgery. He is from home with his spouse. DME: rollator, walker, 3 in 1, shower seat, cane No issues with obtaining home meds.  Wife can provide transportation.             Action/Plan: PT recommending SNF. CM following for d/c disposition.  Expected Discharge Date:                  Expected Discharge Plan:  Skilled Nursing Facility  In-House Referral:  Clinical Social Work  Discharge planning Services     Post Acute Care Choice:    Choice offered to:     DME Arranged:    DME Agency:     HH Arranged:    [facility] Agency:     Status of Service:  In process, will continue to follow  If discussed at Long Length of Stay Meetings, dates discussed:    Additional Comments:  Pollie Friar, RN 10/29/2018, 2:08 PM

## 2018-10-29 NOTE — Progress Notes (Signed)
Patient ID: Justin Trujillo, male   DOB: 02-08-1946, 73 y.o.   MRN: [id number] Vital signs are stable Motor function appears good in lower extremities Dressing is clean and intact We will ambulate today Stable postop

## 2018-10-29 NOTE — Evaluation (Signed)
Occupational Therapy Evaluation Patient Details Name: Justin Trujillo MRN: [id number] DOB: [birth year] Today's Date: 10/29/2018    History of Present Illness Justin Trujillo is a 73 year old individual who has had significant back and bilateral leg pain with weakness.  He was seen and evaluated and having undergone considerable efforts at conservative treatment myelogram was performed.  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  Is been advised regarding the need for surgical decompression and stabilization at the level of L3-L4.  He is now admitted for that procedure.    Clinical Impression   Pt was using a rollator for ambulation and receiving help to wash his back and feet prior to admission. He presents with post operative pain and impaired standing balance. He requires set up to max assist for ADL and min to min guard assist for mobility. Educated pt in back precautions and compensatory strategies for ADL. Pt is unsure if he wants further education in use of AE or to rely on his wife's assist. Will follow acutely.    Follow Up Recommendations  No OT follow up    Equipment Recommendations  None recommended by OT    Recommendations for Other Services       Precautions / Restrictions Precautions Precautions: Fall Restrictions Weight Bearing Restrictions: No      Mobility Bed Mobility Overal bed mobility: Needs Assistance Bed Mobility: Rolling;Sidelying to Sit Rolling: Min assist Sidelying to sit: Min assist       General bed mobility comments: min assist for trunk, cues for log roll, increased time  Transfers Overall transfer level: Needs assistance Equipment used: Rolling walker (2 wheeled) Transfers: Sit to/from Stand Sit to Stand: Min guard Stand pivot transfers: Min guard       General transfer comment: cues for hand placement, elevated bed    Balance Overall balance assessment: Needs  assistance Sitting-balance support: Bilateral upper extremity supported;Feet supported Sitting balance-Leahy Scale: Fair     Standing balance support: Bilateral upper extremity supported;During functional activity Standing balance-Leahy Scale: Fair Standing balance comment: able to release walker in static standing                           ADL either performed or assessed with clinical judgement   ADL Overall ADL's : Needs assistance/impaired Eating/Feeding: Independent;Sitting   Grooming: Wash/dry hands;Standing;Min guard Grooming Details (indicate cue type and reason): instructed in 2 cup method for oral care Upper Body Bathing: Minimal assistance;Sitting Upper Body Bathing Details (indicate cue type and reason): recommended long handled bath sponge for back Lower Body Bathing: Maximal assistance;Sit to/from stand Lower Body Bathing Details (indicate cue type and reason): recommended use of long handled bath sponge and reacher Upper Body Dressing : Set up;Sitting Upper Body Dressing Details (indicate cue type and reason): including back brace Lower Body Dressing: Maximal assistance;Sit to/from stand   Toilet Transfer: Min guard;Ambulation;RW   Toileting- Clothing Manipulation and Hygiene: Moderate assistance;Sit to/from stand Toileting - Clothing Manipulation Details (indicate cue type and reason): educated in avoiding twisting with pericare     Functional mobility during ADLs: Min guard;Rolling walker General ADL Comments: Pt unsure if he wants education in AE or to rely on his wife.     Vision Baseline Vision/History: Wears glasses Patient Visual Report: No change from baseline       Perception     Praxis  Pertinent Vitals/Pain Pain Assessment: Faces Faces Pain Scale: Hurts even more Pain Location: low back  Pain Descriptors / Indicators: Aching Pain Intervention(s): Monitored during session;Repositioned     Hand Dominance     Extremity/Trunk  Assessment Upper Extremity Assessment Upper Extremity Assessment: Overall WFL for tasks assessed   Lower Extremity Assessment Lower Extremity Assessment: Generalized weakness   Cervical / Trunk Assessment Cervical / Trunk Assessment: Kyphotic   Communication Communication Communication: No difficulties   Cognition Arousal/Alertness: Awake/alert Behavior During Therapy: WFL for tasks assessed/performed Overall Cognitive Status: Within Functional Limits for tasks assessed                                     General Comments       Exercises     Shoulder Instructions      Home Living Family/patient expects to be discharged to:: Private residence Living Arrangements: Spouse/significant other Available Help at Discharge: Family;Available 24 hours/day Type of Home: House Home Access: Stairs to enter [employer] of Steps: 5 Entrance Stairs-Rails: Left Home Layout: Two level;Able to live on main level with bedroom/bathroom Alternate Level Stairs-Number of Steps: 2 - alternate route 2 steps but long distance to walk to get there Alternate Level Stairs-Rails: Left Bathroom Shower/Tub: Occupational psychologist: Handicapped height     Home Equipment: Shower seat - built in;Shower seat;Bedside commode;Walker - 4 wheels;Walker - standard;Crutches;Cane - single point;Grab bars - tub/shower;Hand held shower head          Prior Functioning/Environment Level of Independence: Needs assistance  Gait / Transfers Assistance Needed: could stnad 3-5 minutes limited by pain in low back and legs; some weakness; has walked with Rollator for last 8 months prior to surgery.  ADL's / Homemaking Assistance Needed: a little help washing back and feet            OT Problem List: Impaired balance (sitting and/or standing);Decreased knowledge of use of DME or AE;Decreased knowledge of precautions;Pain;Obesity      OT Treatment/Interventions: Self-care/ADL  training;DME and/or AE instruction;Therapeutic activities;Patient/family education    OT Goals(Current goals can be found in the care plan section) Acute Rehab OT Goals Patient Stated Goal: Go home today or tomorrow. OT Goal Formulation: With patient Time For Goal Achievement: 11/12/18 Potential to Achieve Goals: Good ADL Goals Pt Will Perform Grooming: with supervision;standing Pt Will Perform Lower Body Bathing: with supervision;with adaptive equipment;with caregiver independent in assisting;sit to/from stand Pt Will Perform Lower Body Dressing: with supervision;with caregiver independent in assisting;with adaptive equipment;sit to/from stand Pt Will Transfer to Toilet: with supervision;ambulating;bedside commode(over toilet) Pt Will Perform Toileting - Clothing Manipulation and hygiene: with supervision;sit to/from stand Pt Will Perform Tub/Shower Transfer: Shower transfer;with supervision;ambulating;shower seat;rolling walker Additional ADL Goal #1: Pt will generalize back precautions in ADL and mobility independently.  OT Frequency: Min 2X/week   Barriers to D/C:            Co-evaluation              AM-PAC OT "6 Clicks" Daily Activity     Outcome Measure Help from another person eating meals?: None Help from another person taking care of personal grooming?: A Little Help from another person toileting, which includes using toliet, bedpan, or urinal?: A Lot Help from another person bathing (including washing, rinsing, drying)?: A Lot Help from another person to put on and taking off regular upper body clothing?: A  Little Help from another person to put on and taking off regular lower body clothing?: A Lot 6 Click Score: 16   End of Session Equipment Utilized During Treatment: Rolling walker;Back brace;Gait belt  Activity Tolerance: Patient tolerated treatment well Patient left: in chair;with call bell/phone within reach  OT Visit Diagnosis: Unsteadiness on feet  (R26.81);Other abnormalities of gait and mobility (R26.89);Pain                Time: 1510-1545 OT Time Calculation (min): 35 min Charges:  OT General Charges $OT Visit: 1 Visit OT Evaluation $OT Eval Moderate Complexity: 1 Mod OT Treatments $Self Care/Home Management : 8-22 mins  Justin Trujillo 10/29/2018, 4:02 PM  Nestor Lewandowsky, OTR/L Acute Rehabilitation Services Pager: [phone] Office: [phone]

## 2018-10-29 NOTE — Anesthesia Postprocedure Evaluation (Signed)
Anesthesia Post Note  Patient: Justin Trujillo  Procedure(s) Performed: Lumbar three-four  Posterior lumbar interbody fusion with Infuse (N/A Back)     Patient location during evaluation: PACU Anesthesia Type: General Level of consciousness: awake Pain management: pain level controlled Vital Signs Assessment: post-procedure vital signs reviewed and stable Cardiovascular status: stable Postop Assessment: no apparent nausea or vomiting    Last Vitals:  Vitals:   10/29/18 0759 10/29/18 1232  BP: (!) 147/92 139/84  Pulse: 94 97  Resp: 16   Temp: 36.9 C 37.2 C  SpO2: 97% 97%    Last Pain:  Vitals:   10/29/18 1232  TempSrc: Oral  PainSc:                  Gavyn Zoss

## 2018-10-30 ENCOUNTER — Inpatient Hospital Stay (HOSPITAL_COMMUNITY): Payer: PPO

## 2018-10-30 LAB — GLUCOSE, CAPILLARY
Glucose-Capillary: 119 mg/dL — ABNORMAL HIGH (ref 70–99)
Glucose-Capillary: 128 mg/dL — ABNORMAL HIGH (ref 70–99)
Glucose-Capillary: 167 mg/dL — ABNORMAL HIGH (ref 70–99)
Glucose-Capillary: 179 mg/dL — ABNORMAL HIGH (ref 70–99)
Glucose-Capillary: 160 mg/dL — ABNORMAL HIGH (ref 70–99)

## 2018-10-30 NOTE — Progress Notes (Signed)
PT Cancellation Note  Patient Details Name: WINNER VALERIANO MRN: [id number] DOB: 24-Sep-1945   Cancelled Treatment:    Reason Eval/Treat Not Completed: Other (comment); checked back twice with pt for another session for ambulation in hallway; first time, pt just out of bathroom and needing pain meds reporting difficulty getting into bathroom with walker.  Demonstrated side stepping with walker due to limited size between toilet and door frame.  Second time, pt in bed reports had just walked with wife in hallway.  Will attempt tomorrow and try to initiate stair training.    Reginia Naas 10/30/2018, 4:09 PM  Magda Kiel, [place] [phone] 10/30/2018

## 2018-10-30 NOTE — Progress Notes (Signed)
Patient ID: Justin Trujillo, male   DOB: [birth year], 73 y.o.   MRN: [id number] Vital signs are stable Motor function appears good not complaining of much leg pain Thought his mobility would be better with less back pain but given his situation (morbid obesity and preoperative deconditioning) I am not surprised by his perceptions. Patient would like to be discharged home when ready is not at the point where he would like to accept skilled nursing facility as an option We will see how he does with mobility today and if possible he may be a candidate for home discharge with home health physical therapy, otherwise he may have to accept short-term SNF placement

## 2018-10-30 NOTE — Progress Notes (Signed)
Physical Therapy Treatment Patient Details Name: Justin Trujillo MRN: [id number] DOB: 06/27/1946 Today's Date: 10/30/2018    History of Present Illness Justin Trujillo is a 73 year old individual who has had significant back and bilateral leg pain with weakness.  He was seen and evaluated and having undergone considerable efforts at conservative treatment myelogram was performed.  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  Is been advised regarding the need for surgical decompression and stabilization at the level of L3-L4.  He is now admitted for that procedure.     PT Comments    Patient mobilizing better than yesterday, but with full of complaints about mobilizing too much in his opinion as staff not responding when he needs assistance.  Feel he is progressing despite pain and note he is not interested in SNF so feel he will need follow up HHPT at d.c.  Wife states has rollator and standard walker so will need RW for home.    Follow Up Recommendations  Home health PT;Supervision for mobility/OOB     Equipment Recommendations  Rolling walker with 5" wheels    Recommendations for Other Services       Precautions / Restrictions Precautions Precautions: Fall    Mobility  Bed Mobility Overal bed mobility: Needs Assistance Bed Mobility: Sit to Sidelying Rolling: Supervision Sidelying to sit: Supervision     Sit to sidelying: Min assist General bed mobility comments: use of rails, educated not to use elevated HOB if able since not available at home, to side guided legs in bed  Transfers Overall transfer level: Needs assistance Equipment used: Rolling walker (2 wheeled) Transfers: Sit to/from Stand Sit to Stand: Min guard         General transfer comment: elevated height of bed to simulate home equipment  Ambulation/Gait Ambulation/Gait assistance: Min guard Gait Distance (Feet): 80 Feet Assistive  device: Rolling walker (2 wheeled) Gait Pattern/deviations: Step-through pattern;Decreased stride length;Antalgic;Trunk flexed     General Gait Details: cues for posture, trunk extension, less UE support on walker    Stairs             Wheelchair Mobility    Modified Rankin (Stroke Patients Only)       Balance Overall balance assessment: Needs assistance   Sitting balance-Leahy Scale: Fair     Standing balance support: Bilateral upper extremity supported Standing balance-Leahy Scale: Poor Standing balance comment: needs UE support due to pain more than balance                            Cognition Arousal/Alertness: Awake/alert Behavior During Therapy: WFL for tasks assessed/performed Overall Cognitive Status: Within Functional Limits for tasks assessed                                 General Comments: frustrated with multiple complaints about having to move a lot to take care of himself and staff not responsive enough. RN asst director informed      Exercises      General Comments General comments (skin integrity, edema, etc.): wife in room       Pertinent Vitals/Pain Pain Assessment: Faces Pain Score: 3  Pain Location: R leg in sidelying, increased with mobility Pain Descriptors / Indicators: Sharp;Sore Pain Intervention(s): Monitored during session;Repositioned;Ice applied    Home Living  Prior Function            PT Goals (current goals can now be found in the care plan section) Progress towards PT goals: Progressing toward goals    Frequency    Min 5X/week      PT Plan Discharge plan needs to be updated    Co-evaluation              AM-PAC PT "6 Clicks" Mobility   Outcome Measure  Help needed turning from your back to your side while in a flat bed without using bedrails?: A Little Help needed moving from lying on your back to sitting on the side of a flat bed without using  bedrails?: A Little Help needed moving to and from a bed to a chair (including a wheelchair)?: A Little Help needed standing up from a chair using your arms (e.g., wheelchair or bedside chair)?: A Little Help needed to walk in hospital room?: A Little Help needed climbing 3-5 steps with a railing? : A Little 6 Click Score: 18    End of Session Equipment Utilized During Treatment: Back brace Activity Tolerance: Patient limited by pain Patient left: in bed;with call bell/phone within reach;with family/visitor present   PT Visit Diagnosis: Pain;Other abnormalities of gait and mobility (R26.89) Pain - Right/Left: Right Pain - part of body: Hip     Time: 1130-1147 PT Time Calculation (min) (ACUTE ONLY): 17 min  Charges:  $Gait Training: 8-22 mins                     Magda Kiel, [place] [phone] 10/30/2018    Reginia Naas 10/30/2018, 12:57 PM

## 2018-10-30 NOTE — Progress Notes (Signed)
Patient pulled out IV in his sleep.  Dr. Ellene Route is aware and approved the patient to go without an IV.

## 2018-10-30 NOTE — Anesthesia Postprocedure Evaluation (Signed)
Anesthesia Post Note  Patient: Justin Trujillo  Procedure(s) Performed: Lumbar three-four  Posterior lumbar interbody fusion with Infuse (N/A Back)     Patient location during evaluation: PACU Anesthesia Type: General Level of consciousness: awake Pain management: pain level controlled Vital Signs Assessment: post-procedure vital signs reviewed and stable Respiratory status: spontaneous breathing Cardiovascular status: stable Anesthetic complications: no    Last Vitals:  Vitals:   10/30/18 1457 10/30/18 2014  BP: 116/85 (!) 142/81  Pulse: 93 (!) 104  Resp: 18 20  Temp:  36.8 C  SpO2:  96%    Last Pain:  Vitals:   10/30/18 2014  TempSrc: Oral  PainSc:                  [name]

## 2018-10-31 LAB — GLUCOSE, CAPILLARY
GLUCOSE-CAPILLARY: 140 mg/dL — AB (ref 70–99)
Glucose-Capillary: 107 mg/dL — ABNORMAL HIGH (ref 70–99)
Glucose-Capillary: 162 mg/dL — ABNORMAL HIGH (ref 70–99)
Glucose-Capillary: 165 mg/dL — ABNORMAL HIGH (ref 70–99)

## 2018-10-31 MED ORDER — OXYCODONE-ACETAMINOPHEN 10-325 MG PO TABS: 1.0000 | ORAL_TABLET | ORAL | 0 refills | Status: DC | PRN

## 2018-10-31 MED ORDER — GABAPENTIN 300 MG PO CAPS: 600.0000 mg | ORAL_CAPSULE | Freq: Three times a day (TID) | ORAL | 5 refills | Status: DC

## 2018-10-31 MED ORDER — OXYCODONE HCL ER 30 MG PO T12A: 30.0000 mg | EXTENDED_RELEASE_TABLET | Freq: Three times a day (TID) | ORAL | 0 refills | Status: DC

## 2018-10-31 MED ORDER — OXYCODONE HCL ER 15 MG PO T12A
30.0000 mg | EXTENDED_RELEASE_TABLET | Freq: Three times a day (TID) | ORAL | Status: DC
  Administered 2018-10-31 – 2018-11-01 (×2): 30 mg via ORAL
  Filled 2018-10-31 (×3): qty 2

## 2018-10-31 MED ORDER — GABAPENTIN 300 MG PO CAPS
600.0000 mg | ORAL_CAPSULE | Freq: Three times a day (TID) | ORAL | Status: DC
  Administered 2018-10-31 – 2018-11-01 (×4): 600 mg via ORAL
  Filled 2018-10-31 (×4): qty 2

## 2018-10-31 NOTE — Care Management Note (Signed)
Case Management Note  Patient Details  Name: Justin Trujillo MRN: [id number] Date of Birth: [birth year]  Subjective/Objective:                    Action/Plan: Pt discharging home with orders for [facility] services. CM provided him and wife choice and they selected [place]. Butch Penny with [facility] notified and accepted the referral.  Pt with orders for walker. Jermaine with [facility] DME notified and equipment delivered to the room. Wife to provide transport home when medically ready.  Expected Discharge Date:  10/31/18               Expected Discharge Plan:  [place]  In-House Referral:  Clinical Social Work  Discharge planning Services  CM Consult  Post Acute Care Choice:  Home Health, Durable Medical Equipment Choice offered to:  Patient, Spouse  DME Arranged:  Walker rolling DME Agency:  [place] Arranged:  PT, OT [place] Agency:  [place]  Status of Service:  Completed, signed off  If discussed at [place] of Stay Meetings, dates discussed:    Additional Comments:  Pollie Friar, RN 10/31/2018, 12:59 PM

## 2018-10-31 NOTE — Care Management Important Message (Signed)
Important Message  Patient Details  Name: Justin Trujillo MRN: [id number] Date of Birth: 04-28-1946   Medicare Important Message Given:  Yes    Orbie Pyo 10/31/2018, 4:03 PM

## 2018-10-31 NOTE — Progress Notes (Signed)
Physical Therapy Treatment Patient Details Name: Justin Trujillo MRN: [id number] DOB: 05-22-1946 Today's Date: 10/31/2018    History of Present Illness Cahlil Psalm Schappell is a 73 year old individual who has had significant back and bilateral leg pain with weakness.  He was seen and evaluated and having undergone considerable efforts at conservative treatment myelogram was performed.  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  Is been advised regarding the need for surgical decompression and stabilization at the level of L3-L4.  He is now admitted for that procedure.     PT Comments    Pt performed gait training and stair training to prepare for return home with support from spouse. On arrival patient with RW delivered and PTA adjusted RW for height and fit.  Pt performed 120 ft of gait and continues to present with flexed posture.  Pt is slow and guarded and as activity increased he complained of pain in B HS with L>R.  Plan for return home remains appropriate with HHPT follow up.  Pt able to recall 3/3 precautions and spouse educated on cueing patient to slow down as he remains impulsive and unsafe with pain increases.    Follow Up Recommendations  Home health PT;Supervision for mobility/OOB     Equipment Recommendations  Rolling walker with 5" wheels    Recommendations for Other Services       Precautions / Restrictions Precautions Precautions: Back;Fall Precaution Booklet Issued: Yes (comment) Precaution Comments: educated wife in back precautions, reinforced with pt Required Braces or Orthoses: Spinal Brace Restrictions Weight Bearing Restrictions: No Other Position/Activity Restrictions: Pt able to recall 3/3 precautions    Mobility  Bed Mobility Overal bed mobility: Needs Assistance Bed Mobility: Rolling;Sidelying to Sit Rolling: Supervision Sidelying to sit: Supervision     Sit to sidelying: Min  assist General bed mobility comments: No assistance need, + rail and HOB elevated.    Transfers Overall transfer level: Needs assistance Equipment used: Rolling walker (2 wheeled) Transfers: Sit to/from Stand Sit to Stand: Min guard Stand pivot transfers: Min guard       General transfer comment: Pt unable to achieve standing from low bed.  But once bed height was elevated he was able to rise from bed.  Poor hand placement noted despite cues to correct.  Pt performed additional transfer from standard chair with armrests and able to perform with min gaurd.    Ambulation/Gait Ambulation/Gait assistance: Min guard Gait Distance (Feet): 120 Feet(after trialing stairs he was in too much pain to ambulate back to room.  ) Assistive device: Rolling walker (2 wheeled) Gait Pattern/deviations: Step-through pattern;Decreased stride length;Antalgic;Trunk flexed Gait velocity: decreased   General Gait Details: cues for posture, trunk extension, less UE support on walker.  Adjusted RW height for improved fit.  Pt fatigues quickly with increased pain.    Stairs Stairs: Yes Stairs assistance: Min guard Stair Management: One rail Left Number of Stairs: 2 General stair comments: Cues for sequencing and hand placement on railing.  Pt flexed over railing and refused to climb past 2 stairs due to pain.     Wheelchair Mobility    Modified Rankin (Stroke Patients Only)       Balance Overall balance assessment: Needs assistance   Sitting balance-Leahy Scale: Fair     Standing balance support: Bilateral upper extremity supported Standing balance-Leahy Scale: Poor Standing balance comment: needs UE support due to pain more than balance  Cognition Arousal/Alertness: Awake/alert Behavior During Therapy: WFL for tasks assessed/performed;Agitated Overall Cognitive Status: Within Functional Limits for tasks assessed                                  General Comments: Pt agitated once in pain but apologetic upin return to his room.        Exercises      General Comments        Pertinent Vitals/Pain Pain Assessment: Faces Faces Pain Scale: Hurts even more Pain Location: B hamstrings L>R Pain Descriptors / Indicators: Tightness Pain Intervention(s): Monitored during session;Repositioned;Heat applied    Home Living                      Prior Function            PT Goals (current goals can now be found in the care plan section) Acute Rehab PT Goals Patient Stated Goal: Go home today or tomorrow. Potential to Achieve Goals: Fair Progress towards PT goals: Progressing toward goals    Frequency    Min 5X/week      PT Plan Discharge plan needs to be updated    Co-evaluation              AM-PAC PT "6 Clicks" Mobility   Outcome Measure  Help needed turning from your back to your side while in a flat bed without using bedrails?: A Little Help needed moving from lying on your back to sitting on the side of a flat bed without using bedrails?: A Little Help needed moving to and from a bed to a chair (including a wheelchair)?: A Little Help needed standing up from a chair using your arms (e.g., wheelchair or bedside chair)?: A Little Help needed to walk in hospital room?: A Little Help needed climbing 3-5 steps with a railing? : A Little 6 Click Score: 18    End of Session Equipment Utilized During Treatment: Back brace Activity Tolerance: Patient limited by pain Patient left: in bed;with call bell/phone within reach;with family/visitor present Nurse Communication: Mobility status PT Visit Diagnosis: Pain;Other abnormalities of gait and mobility (R26.89) Pain - Right/Left: Right Pain - part of body: Hip     Time: 1191-4782 PT Time Calculation (min) (ACUTE ONLY): 17 min  Charges:  $Gait Training: 8-22 mins                     Governor Rooks, PTA Acute Rehabilitation Services Pager  [phone] Office [phone]     Debroh Sieloff Eli Hose 10/31/2018, 1:55 PM

## 2018-10-31 NOTE — Progress Notes (Signed)
Occupational Therapy Treatment Patient Details Name: Justin Trujillo MRN: [id number] DOB: 07-16-1946 Today's Date: 10/31/2018    History of present illness Justin Trujillo is a 73 year old individual who has had significant back and bilateral leg pain with weakness.  He was seen and evaluated and having undergone considerable efforts at conservative treatment myelogram was performed.  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  Is been advised regarding the need for surgical decompression and stabilization at the level of L3-L4.  He is now admitted for that procedure.    OT comments  Wife available for education in back precautions related to ADL and IADL. Instructed in compensatory strategies and use of reacher. Wife will assist with socks and washing feet and back as she did prior to admission. Educated in how to transport items safely with RW. Wife verbalizing understanding. Pt declined remaining up in chair.  Follow Up Recommendations  No OT follow up    Equipment Recommendations  None recommended by OT    Recommendations for Other Services      Precautions / Restrictions Precautions Precautions: Back;Fall Precaution Booklet Issued: Yes (comment) Precaution Comments: educated wife in back precautions, reinforced with pt Required Braces or Orthoses: Spinal Brace       Mobility Bed Mobility   Bed Mobility: Sidelying to Sit;Sit to Sidelying   Sidelying to sit: Supervision     Sit to sidelying: Min assist General bed mobility comments: assisted for LEs back into bed only  Transfers Overall transfer level: Needs assistance Equipment used: Rolling walker (2 wheeled) Transfers: Sit to/from Stand Sit to Stand: Min guard         General transfer comment: slow to rise, cues for hand placement    Balance Overall balance assessment: Needs assistance   Sitting balance-Leahy Scale: Fair     Standing  balance support: Bilateral upper extremity supported Standing balance-Leahy Scale: Poor                             ADL either performed or assessed with clinical judgement   ADL Overall ADL's : Needs assistance/impaired     Grooming: Wash/dry hands;Oral care;Standing;Min guard Grooming Details (indicate cue type and reason): educated wife in two cup method and to use chair at sink for activities requiring extended standing         Upper Body Dressing : Set up;Sitting   Lower Body Dressing: Maximal assistance;Sit to/from stand Lower Body Dressing Details (indicate cue type and reason): educated in use of reacher for LB dressing, will rely on wife for socks, aware of availability of sock aid Toilet Transfer: Min guard;Ambulation;RW   Toileting- Clothing Manipulation and Hygiene: Minimal assistance(standing) Toileting - Clothing Manipulation Details (indicate cue type and reason): educated wife in use of tongs to extend reach and avoid twisting     Functional mobility during ADLs: Min Youth worker     Praxis      Cognition Arousal/Alertness: Awake/alert Behavior During Therapy: WFL for tasks assessed/performed Overall Cognitive Status: Within Functional Limits for tasks assessed                                          Exercises  Shoulder Instructions       General Comments      Pertinent Vitals/ Pain       Pain Assessment: Faces Faces Pain Scale: Hurts even more Pain Location: back Pain Descriptors / Indicators: Operative site guarding;Grimacing;Guarding Pain Intervention(s): RN gave pain meds during session  Home Living                                          Prior Functioning/Environment              Frequency  Min 2X/week        Progress Toward Goals  OT Goals(current goals can now be found in the care plan section)  Progress towards OT goals:  Progressing toward goals  Acute Rehab OT Goals Patient Stated Goal: Go home today or tomorrow. OT Goal Formulation: With patient Time For Goal Achievement: 11/12/18 Potential to Achieve Goals: Good  Plan Discharge plan remains appropriate    Co-evaluation                 AM-PAC OT "6 Clicks" Daily Activity     Outcome Measure   Help from another person eating meals?: None Help from another person taking care of personal grooming?: A Little Help from another person toileting, which includes using toliet, bedpan, or urinal?: A Little Help from another person bathing (including washing, rinsing, drying)?: A Lot Help from another person to put on and taking off regular upper body clothing?: None Help from another person to put on and taking off regular lower body clothing?: A Lot 6 Click Score: 18    End of Session Equipment Utilized During Treatment: Rolling walker;Back brace;Gait belt  OT Visit Diagnosis: Unsteadiness on feet (R26.81);Other abnormalities of gait and mobility (R26.89);Pain   Activity Tolerance Patient limited by pain   Patient Left in bed;with call bell/phone within reach;with family/visitor present   Nurse Communication          Time: 1001-1033 OT Time Calculation (min): 32 min  Charges: OT General Charges $OT Visit: 1 Visit OT Treatments $Self Care/Home Management : 23-37 mins  Nestor Lewandowsky, OTR/L Acute Rehabilitation Services Pager: [phone] Office: [phone]   Malka So 10/31/2018, 10:41 AM

## 2018-10-31 NOTE — Discharge Summary (Signed)
Physician Discharge Summary  Patient ID: DAMIONE ROBIDEAU MRN: [id number] DOB/AGE: [birth year] 73 y.o.  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  Morbid obesity.  Diabetes mellitus.  Moderate deconditioning.  Complex pain management, severe constipation Active Problems:   Lumbar stenosis with neurogenic claudication   Discharged Condition: fair  Hospital Course: Patient was admitted to undergo surgical decompression and stabilization at L3-L4 which she initially tolerated well.  Is been having some difficulty with mobilization.  Pain control has been also an issue and he has had to have some increase in the degree of OxyContin that he is taking in addition to oxycodone.  He has had constipation which is been overcome.  Consults: None  Significant Diagnostic Studies: None  Treatments: surgery: Laminectomy and decompression L3-L4 with posterior lumbar interbody arthrodesis.  Pedicle screw fixation L3-L4.  Discharge Exam: Blood pressure (!) 151/95, pulse 93, temperature 98.1 F (36.7 C), temperature source Oral, resp. rate 20, height 5\' 5"  (1.651 m), weight 99.8 kg, SpO2 97 %. Incision is clean and dry.  Station and gait are intact with an assistive device such as a walker.  Patient's mobility is hampered by morbid obesity.  He is also severely deconditioned and becomes easily short of breath.  Disposition: Discharge disposition: 01-Home or Self Care       Discharge Instructions    Call MD for:  redness, tenderness, or signs of infection (pain, swelling, redness, odor or green/yellow discharge around incision site)   Complete by:  As directed    Call MD for:  severe uncontrolled pain   Complete by:  As directed    Call MD for:  temperature >100.4   Complete by:  As  directed    Diet - low sodium heart healthy   Complete by:  As directed    Diet - low sodium heart healthy   Complete by:  As directed    Incentive spirometry RT   Complete by:  As directed    Increase activity slowly   Complete by:  As directed    Increase activity slowly   Complete by:  As directed      Allergies as of 10/31/2018      Reactions   Iodinated Diagnostic Agents Anaphylaxis   Moxifloxacin Swelling   REACTION: GI upset, throat "tightened up"   Penicillins Anaphylaxis   Shellfish-derived Products Anaphylaxis   Latex Rash   Over long periods of time      Medication List    TAKE these medications   atorvastatin 40 MG tablet Commonly known as:  LIPITOR Take 1 tablet (40 mg total) by mouth daily.   gabapentin 300 MG capsule Commonly known as:  NEURONTIN Take 2 capsules (600 mg total) by mouth 3 (three) times daily. What changed:  how much to take   insulin NPH-regular Human (70-30) 100 UNIT/ML injection Inject 10-15 Units into the skin daily as needed (ONLY WHEN FASTING BLOOD SUGAR 150 OR MORE).   LINZESS 145 MCG Caps capsule Generic drug:  linaclotide Take 145-290 mcg by mouth daily as needed (constipation).   magnesium oxide 400 MG tablet Commonly known as:  MAG-OX Take 400-1,200 mg by mouth daily as needed (cramps).   metFORMIN 1000 MG tablet Commonly known as:  GLUCOPHAGE Take 1 tablet (1,000 mg total) by mouth 2 (two) times daily with a meal.   naproxen  500 MG tablet Commonly known as:  NAPROSYN Take 500 mg by mouth daily as needed for moderate pain.   oxyCODONE 30 MG 12 hr tablet Take 1 tablet (30 mg total) by mouth every 8 (eight) hours. What changed:  when to take this   oxyCODONE-acetaminophen 10-325 MG tablet Commonly known as:  PERCOCET Take 1 tablet by mouth every 4 (four) hours as needed for pain.   rOPINIRole 0.5 MG tablet Commonly known as:  REQUIP Take 0.5 mg by mouth at bedtime as needed (LEG CRAMPING).   testosterone  cypionate 200 MG/ML injection Commonly known as:  DEPOTESTOSTERONE CYPIONATE Inject 200 mg into the muscle every 14 (fourteen) days.   tiZANidine 4 MG tablet Commonly known as:  ZANAFLEX Take 4 mg by mouth 3 (three) times daily as needed for muscle spasms.   trolamine salicylate 10 % cream Commonly known as:  ASPERCREME Apply 1 application topically as needed for muscle pain.   ZESTORETIC 20-12.5 MG tablet Generic drug:  lisinopril-hydrochlorothiazide Take 1 tablet by mouth daily.        Signed: Blanchie Dessert Naylene Foell 10/31/2018, 11:28 AM

## 2018-11-01 LAB — GLUCOSE, CAPILLARY: Glucose-Capillary: 109 mg/dL — ABNORMAL HIGH (ref 70–99)

## 2018-11-01 NOTE — Plan of Care (Signed)
   Problem: Clinical Measurements: Goal: Respiratory complications will improve Outcome: Progressing   Problem: Clinical Measurements: Goal: Will remain free from infection 11/01/2018 0204 by Marcos Eke, RN Outcome: Progressing 11/01/2018 0057 by Carin Hock I, RN Outcome: Progressing   Problem: Clinical Measurements: Goal: Ability to maintain clinical measurements within normal limits will improve Outcome: Progressing

## 2018-11-01 NOTE — Plan of Care (Signed)
  Problem: Clinical Measurements: Goal: Will remain free from infection 11/01/2018 0204 by Marcos Eke, RN Outcome: Progressing 11/01/2018 0057 by Carin Hock I, RN Outcome: Progressing   Problem: Clinical Measurements: Goal: Ability to maintain clinical measurements within normal limits will improve Outcome: Progressing   Problem: Health Behavior/Discharge Planning: Goal: Ability to manage health-related needs will improve 11/01/2018 0330 by Marcos Eke, RN Outcome: Progressing 11/01/2018 0057 by Marcos Eke, RN Outcome: Progressing

## 2018-11-01 NOTE — Progress Notes (Signed)
Subjective: The patient is alert and pleasant.  He wants to go home.  His wife is at the bedside.  Objective: Vital signs in last 24 hours: Temp:  [97.9 F (36.6 C)-99.1 F (37.3 C)] 98 F (36.7 C) (02/15 0737) Pulse Rate:  [78-102] 78 (02/15 0737) Resp:  [16-20] 20 (02/15 0737) BP: (117-155)/(68-87) 138/84 (02/15 0737) SpO2:  [96 %-98 %] 96 % (02/15 0737) Estimated body mass index is 36.61 kg/m as calculated from the following:   Height as of this encounter: 5\' 5"  (1.651 m).   Weight as of this encounter: 99.8 kg.   Intake/Output from previous day: 02/14 0701 - 02/15 0700 In: 120 [P.O.:120] Out: 400 [Urine:400] Intake/Output this shift: Total I/O In: 240 [P.O.:240] Out: 280 [Urine:280]  Physical exam the patient is alert and pleasant.  His wound is healing well.  He is moving his lower extremities well.  Lab Results: No results for input(s): WBC, HGB, HCT, PLT in the last 72 hours. BMET No results for input(s): NA, K, CL, CO2, GLUCOSE, BUN, CREATININE, CALCIUM in the last 72 hours.  Studies/Results: Dg Lumbar Spine 2-3 Views  Result Date: 10/30/2018 CLINICAL DATA:  Post-op check. Hx of lumbar fusion, laminectomy, kyphoplasty. Pt c/o back pain since surgery on 2/11. Pain worse with standing. EXAM: LUMBAR SPINE - 2-3 VIEW COMPARISON:  Operative images, 10/28/2018. FINDINGS: Status post L3-L4 posterior lumbar spine fusion. The bilateral pedicle screws appear well seated well-positioned. There is radiolucent disc spacer along the central the posterior aspect of the L3-L4 disc interspace. Chronic fracture of L4 with previous treatment with vertebroplasty, stable prior exams. No other fractures. Slight retrolisthesis, L2 on L3 and anterolisthesis, L1 and L2, stable. Marked loss of disc height at L1-L2. Moderate to marked loss of disc height at L2-L3. Mild loss of disc height at L4-L5. 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 Soft tissues are unremarkable. IMPRESSION: 1. Well-positioned  orthopedic hardware fusing L3-L5. 2. No evidence of an operative complication. 3. No acute findings. 4. Degenerative changes and dextroscoliosis as described. Electronically Signed   By: Lajean Manes M.D.   On: 10/30/2018 11:40    Assessment/Plan: Postop day #4: The patient is doing well.  I gave them his discharge instructions and answered all their questions.  LOS: 4 days     Ophelia Charter 11/01/2018, 9:35 AM

## 2018-11-01 NOTE — Progress Notes (Signed)
Occupational Therapy Treatment Patient Details Name: Justin Trujillo MRN: [id number] DOB: 02/27/1946 Today's Date: 11/01/2018    History of present illness Justin Trujillo is a 73 year old individual who has had significant back and bilateral leg pain with weakness.  He was seen and evaluated and having undergone considerable efforts at conservative treatment myelogram was performed.  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  Is been advised regarding the need for surgical decompression and stabilization at the level of L3-L4.  He is now admitted for that procedure.    OT comments  Upon arrival, pt sidelying in bed with pillow between knees. Pt agreeable to attempt dressing and OOB for breakfast. Once at EOB, pt reporting significant increase in pain "jumping from 4 to 6 and now it is a 8", and pt leaning side to side. Pt donning underwear at EOB with Min Guard A, and then returning to supine due to pain. RN arriving and providing pain medication. Educating pt on "no twisting" as he turns and twists with pain while in bed; educating to either position himself in supine or sidelying for back precautions. Reviewed back precautions. Pt planning for dc home today. Will continue to follow acutely to continue reviewing back precautions and compensatory techniques for ADLs.    Follow Up Recommendations  No OT follow up    Equipment Recommendations  None recommended by OT    Recommendations for Other Services      Precautions / Restrictions Precautions Precautions: Back;Fall Precaution Booklet Issued: Yes (comment) Precaution Comments: Pt requiring cues throughout on no twist as pt has tendency to twist when in pain and change positions Required Braces or Orthoses: Spinal Brace Spinal Brace: Lumbar corset Restrictions Weight Bearing Restrictions: No Other Position/Activity Restrictions: Pt able to recall 3/3 precautions        Mobility Bed Mobility Overal bed mobility: Needs Assistance Bed Mobility: Rolling;Sidelying to Sit;Sit to Sidelying Rolling: Supervision Sidelying to sit: Supervision     Sit to sidelying: Min guard General bed mobility comments: Supervision-Min Guard A for safety  Transfers                 General transfer comment: Declined due to pain    Balance Overall balance assessment: Needs assistance Sitting-balance support: Bilateral upper extremity supported;Feet supported Sitting balance-Leahy Scale: Fair                                     ADL either performed or assessed with clinical judgement   ADL Overall ADL's : Needs assistance/impaired                     Lower Body Dressing: Min guard;Sitting/lateral leans;Cueing for back precautions Lower Body Dressing Details (indicate cue type and reason): Pt able to bring LEs up to underwear and then laterally lean to slide over hips. During session, pt leaning side to side and reporting sudden increase in pain.                General ADL Comments: Pt agreeable to dressing and then OOB for breakfast. once EOB, pt reporting a increrase in pain level and began leaning side to side and attempting to twist for repositioning with pain. Requiring cues to no twist. Pt returning to bed and saying he will need to be in a "horizontal position" for awhile.  RN providing pain med during session.      Vision       Perception     Praxis      Cognition Arousal/Alertness: Awake/alert Behavior During Therapy: WFL for tasks assessed/performed;Agitated Overall Cognitive Status: Within Functional Limits for tasks assessed                                 General Comments: Agitated by pain and stating "it was a 4 then quickly moved to a 6 and now it is an 8. And I just can't do anthying at an 8." Focused on pain level. RN providing pain meds during session        Exercises     Shoulder  Instructions       General Comments RN present    Pertinent Vitals/ Pain       Pain Assessment: 0-10 Pain Score: 8  Pain Location: Back Pain Descriptors / Indicators: Tightness;Constant;Discomfort;Grimacing;Guarding;Moaning Pain Intervention(s): Monitored during session;Limited activity within patient's tolerance;Repositioned;RN gave pain meds during session  Home Living                                          Prior Functioning/Environment              Frequency  Min 2X/week        Progress Toward Goals  OT Goals(current goals can now be found in the care plan section)  Progress towards OT goals: Not progressing toward goals - comment(Limted by pain this session)  Acute Rehab OT Goals Patient Stated Goal: Go home today or tomorrow. OT Goal Formulation: With patient Time For Goal Achievement: 11/12/18 Potential to Achieve Goals: Good ADL Goals Pt Will Perform Grooming: with supervision;standing Pt Will Perform Lower Body Bathing: with supervision;with adaptive equipment;with caregiver independent in assisting;sit to/from stand Pt Will Perform Lower Body Dressing: with supervision;with caregiver independent in assisting;with adaptive equipment;sit to/from stand Pt Will Transfer to Toilet: with supervision;ambulating;bedside commode(over toilet) Pt Will Perform Toileting - Clothing Manipulation and hygiene: with supervision;sit to/from stand Pt Will Perform Tub/Shower Transfer: Shower transfer;with supervision;ambulating;shower seat;rolling walker Additional ADL Goal #1: Pt will generalize back precautions in ADL and mobility independently.  Plan Discharge plan remains appropriate    Co-evaluation                 AM-PAC OT "6 Clicks" Daily Activity     Outcome Measure   Help from another person eating meals?: None Help from another person taking care of personal grooming?: A Little Help from another person toileting, which includes using  toliet, bedpan, or urinal?: A Little Help from another person bathing (including washing, rinsing, drying)?: A Lot Help from another person to put on and taking off regular upper body clothing?: None Help from another person to put on and taking off regular lower body clothing?: A Little 6 Click Score: 19    End of Session    OT Visit Diagnosis: Unsteadiness on feet (R26.81);Other abnormalities of gait and mobility (R26.89);Pain Pain - part of body: (Back)   Activity Tolerance Patient limited by pain   Patient Left in bed;with call bell/phone within reach   Nurse Communication Mobility status;Precautions        Time: 6503-5465 OT Time Calculation (min): 20 min  Charges: OT General Charges $OT Visit: 1 Visit OT Treatments $  Self Care/Home Management : 8-22 mins  [place], OTR/L Acute Rehab Pager: [phone] Office: [place] 11/01/2018, 8:42 AM

## 2018-11-01 NOTE — Progress Notes (Signed)
NURSING PROGRESS NOTE  AVYON HERENDEEN [id number] Discharge Data: 11/01/2018 9:53 AM Attending Provider: Kristeen Miss, MD YJE:HUDJS, Hal Hope, MD     Durene Fruits to be D/C'd Home per MD order.  Discussed with the patient the After Visit Summary and all questions fully answered. All IV's discontinued with no bleeding noted. All belongings returned to patient for patient to take home.   Last Vital Signs:  Blood pressure 138/84, pulse 78, temperature 98 F (36.7 C), temperature source Oral, resp. rate 20, height 5\' 5"  (1.651 m), weight 99.8 kg, SpO2 96 %.  Discharge Medication List Allergies as of 11/01/2018      Reactions   Iodinated Diagnostic Agents Anaphylaxis   Moxifloxacin Swelling   REACTION: GI upset, throat "tightened up"   Penicillins Anaphylaxis   Shellfish-derived Products Anaphylaxis   Latex Rash   Over long periods of time      Medication List    TAKE these medications   atorvastatin 40 MG tablet Commonly known as:  LIPITOR Take 1 tablet (40 mg total) by mouth daily.   gabapentin 300 MG capsule Commonly known as:  NEURONTIN Take 2 capsules (600 mg total) by mouth 3 (three) times daily. What changed:  how much to take   insulin NPH-regular Human (70-30) 100 UNIT/ML injection Inject 10-15 Units into the skin daily as needed (ONLY WHEN FASTING BLOOD SUGAR 150 OR MORE).   LINZESS 145 MCG Caps capsule Generic drug:  linaclotide Take 145-290 mcg by mouth daily as needed (constipation).   magnesium oxide 400 MG tablet Commonly known as:  MAG-OX Take 400-1,200 mg by mouth daily as needed (cramps).   metFORMIN 1000 MG tablet Commonly known as:  GLUCOPHAGE Take 1 tablet (1,000 mg total) by mouth 2 (two) times daily with a meal.   naproxen 500 MG tablet Commonly known as:  NAPROSYN Take 500 mg by mouth daily as needed for moderate pain.   oxyCODONE 30 MG 12 hr tablet Take 1 tablet (30 mg total) by mouth every 8 (eight) hours. What changed:  when to take  this   oxyCODONE-acetaminophen 10-325 MG tablet Commonly known as:  PERCOCET Take 1 tablet by mouth every 4 (four) hours as needed for pain.   rOPINIRole 0.5 MG tablet Commonly known as:  REQUIP Take 0.5 mg by mouth at bedtime as needed (LEG CRAMPING).   testosterone cypionate 200 MG/ML injection Commonly known as:  DEPOTESTOSTERONE CYPIONATE Inject 200 mg into the muscle every 14 (fourteen) days.   tiZANidine 4 MG tablet Commonly known as:  ZANAFLEX Take 4 mg by mouth 3 (three) times daily as needed for muscle spasms.   trolamine salicylate 10 % cream Commonly known as:  ASPERCREME Apply 1 application topically as needed for muscle pain.   ZESTORETIC 20-12.5 MG tablet Generic drug:  lisinopril-hydrochlorothiazide Take 1 tablet by mouth daily.

## 2018-11-01 NOTE — Plan of Care (Signed)
  Problem: Health Behavior/Discharge Planning: Goal: Ability to manage health-related needs will improve Outcome: Progressing   Problem: Clinical Measurements: Goal: Will remain free from infection Outcome: Progressing   Problem: Education: Goal: Knowledge of General Education information will improve Description Including pain rating scale, medication(s)/side effects and non-pharmacologic comfort measures Outcome: Progressing

## 2018-11-03 DIAGNOSIS — I10 Essential (primary) hypertension: Secondary | ICD-10-CM | POA: Diagnosis not present

## 2018-11-03 DIAGNOSIS — M48062 Spinal stenosis, lumbar region with neurogenic claudication: Secondary | ICD-10-CM | POA: Diagnosis not present

## 2018-11-03 DIAGNOSIS — M1990 Unspecified osteoarthritis, unspecified site: Secondary | ICD-10-CM | POA: Diagnosis not present

## 2018-11-03 DIAGNOSIS — Z79891 Long term (current) use of opiate analgesic: Secondary | ICD-10-CM | POA: Diagnosis not present

## 2018-11-03 DIAGNOSIS — Z96651 Presence of right artificial knee joint: Secondary | ICD-10-CM | POA: Diagnosis not present

## 2018-11-03 DIAGNOSIS — E785 Hyperlipidemia, unspecified: Secondary | ICD-10-CM | POA: Diagnosis not present

## 2018-11-03 DIAGNOSIS — Z4789 Encounter for other orthopedic aftercare: Secondary | ICD-10-CM | POA: Diagnosis not present

## 2018-11-03 DIAGNOSIS — Z981 Arthrodesis status: Secondary | ICD-10-CM | POA: Diagnosis not present

## 2018-11-03 DIAGNOSIS — E119 Type 2 diabetes mellitus without complications: Secondary | ICD-10-CM | POA: Diagnosis not present

## 2018-11-03 DIAGNOSIS — Z794 Long term (current) use of insulin: Secondary | ICD-10-CM | POA: Diagnosis not present

## 2018-11-03 DIAGNOSIS — F17210 Nicotine dependence, cigarettes, uncomplicated: Secondary | ICD-10-CM | POA: Diagnosis not present

## 2018-11-03 DIAGNOSIS — G8918 Other acute postprocedural pain: Secondary | ICD-10-CM | POA: Diagnosis not present

## 2018-11-03 DIAGNOSIS — I2510 Atherosclerotic heart disease of native coronary artery without angina pectoris: Secondary | ICD-10-CM | POA: Diagnosis not present

## 2018-11-03 DIAGNOSIS — J438 Other emphysema: Secondary | ICD-10-CM | POA: Diagnosis not present

## 2018-11-04 MED FILL — Heparin Sodium (Porcine) Inj 1000 Unit/ML: INTRAMUSCULAR | Qty: 30 | Status: AC

## 2018-11-04 MED FILL — Sodium Chloride IV Soln 0.9%: INTRAVENOUS | Qty: 1000 | Status: AC

## 2018-11-13 DIAGNOSIS — M48062 Spinal stenosis, lumbar region with neurogenic claudication: Secondary | ICD-10-CM | POA: Diagnosis not present

## 2018-11-14 DIAGNOSIS — E785 Hyperlipidemia, unspecified: Secondary | ICD-10-CM | POA: Diagnosis not present

## 2018-11-14 DIAGNOSIS — G8918 Other acute postprocedural pain: Secondary | ICD-10-CM | POA: Diagnosis not present

## 2018-11-14 DIAGNOSIS — Z794 Long term (current) use of insulin: Secondary | ICD-10-CM | POA: Diagnosis not present

## 2018-11-14 DIAGNOSIS — E119 Type 2 diabetes mellitus without complications: Secondary | ICD-10-CM | POA: Diagnosis not present

## 2018-11-14 DIAGNOSIS — Z981 Arthrodesis status: Secondary | ICD-10-CM | POA: Diagnosis not present

## 2018-11-14 DIAGNOSIS — Z4789 Encounter for other orthopedic aftercare: Secondary | ICD-10-CM | POA: Diagnosis not present

## 2018-11-14 DIAGNOSIS — M1990 Unspecified osteoarthritis, unspecified site: Secondary | ICD-10-CM | POA: Diagnosis not present

## 2018-11-20 DIAGNOSIS — Z96651 Presence of right artificial knee joint: Secondary | ICD-10-CM | POA: Diagnosis not present

## 2018-11-20 DIAGNOSIS — J438 Other emphysema: Secondary | ICD-10-CM | POA: Diagnosis not present

## 2018-11-20 DIAGNOSIS — E785 Hyperlipidemia, unspecified: Secondary | ICD-10-CM | POA: Diagnosis not present

## 2018-11-20 DIAGNOSIS — I2510 Atherosclerotic heart disease of native coronary artery without angina pectoris: Secondary | ICD-10-CM | POA: Diagnosis not present

## 2018-11-20 DIAGNOSIS — Z794 Long term (current) use of insulin: Secondary | ICD-10-CM | POA: Diagnosis not present

## 2018-11-20 DIAGNOSIS — G8918 Other acute postprocedural pain: Secondary | ICD-10-CM | POA: Diagnosis not present

## 2018-11-20 DIAGNOSIS — M48062 Spinal stenosis, lumbar region with neurogenic claudication: Secondary | ICD-10-CM | POA: Diagnosis not present

## 2018-11-20 DIAGNOSIS — Z981 Arthrodesis status: Secondary | ICD-10-CM | POA: Diagnosis not present

## 2018-11-20 DIAGNOSIS — Z79891 Long term (current) use of opiate analgesic: Secondary | ICD-10-CM | POA: Diagnosis not present

## 2018-11-20 DIAGNOSIS — F17210 Nicotine dependence, cigarettes, uncomplicated: Secondary | ICD-10-CM | POA: Diagnosis not present

## 2018-11-20 DIAGNOSIS — I10 Essential (primary) hypertension: Secondary | ICD-10-CM | POA: Diagnosis not present

## 2018-11-20 DIAGNOSIS — E119 Type 2 diabetes mellitus without complications: Secondary | ICD-10-CM | POA: Diagnosis not present

## 2018-11-20 DIAGNOSIS — M1990 Unspecified osteoarthritis, unspecified site: Secondary | ICD-10-CM | POA: Diagnosis not present

## 2018-11-20 DIAGNOSIS — Z4789 Encounter for other orthopedic aftercare: Secondary | ICD-10-CM | POA: Diagnosis not present

## 2018-12-03 DIAGNOSIS — M6281 Muscle weakness (generalized): Secondary | ICD-10-CM | POA: Diagnosis not present

## 2018-12-03 DIAGNOSIS — M2560 Stiffness of unspecified joint, not elsewhere classified: Secondary | ICD-10-CM | POA: Diagnosis not present

## 2018-12-03 DIAGNOSIS — M48061 Spinal stenosis, lumbar region without neurogenic claudication: Secondary | ICD-10-CM | POA: Diagnosis not present

## 2018-12-03 DIAGNOSIS — M545 Low back pain: Secondary | ICD-10-CM | POA: Diagnosis not present

## 2018-12-05 DIAGNOSIS — M6281 Muscle weakness (generalized): Secondary | ICD-10-CM | POA: Diagnosis not present

## 2018-12-05 DIAGNOSIS — M545 Low back pain: Secondary | ICD-10-CM | POA: Diagnosis not present

## 2018-12-05 DIAGNOSIS — M2560 Stiffness of unspecified joint, not elsewhere classified: Secondary | ICD-10-CM | POA: Diagnosis not present

## 2018-12-05 DIAGNOSIS — M48061 Spinal stenosis, lumbar region without neurogenic claudication: Secondary | ICD-10-CM | POA: Diagnosis not present

## 2018-12-10 DIAGNOSIS — M48062 Spinal stenosis, lumbar region with neurogenic claudication: Secondary | ICD-10-CM | POA: Diagnosis not present

## 2019-01-07 DIAGNOSIS — M2560 Stiffness of unspecified joint, not elsewhere classified: Secondary | ICD-10-CM | POA: Diagnosis not present

## 2019-01-07 DIAGNOSIS — M48061 Spinal stenosis, lumbar region without neurogenic claudication: Secondary | ICD-10-CM | POA: Diagnosis not present

## 2019-01-07 DIAGNOSIS — M545 Low back pain: Secondary | ICD-10-CM | POA: Diagnosis not present

## 2019-01-07 DIAGNOSIS — M6281 Muscle weakness (generalized): Secondary | ICD-10-CM | POA: Diagnosis not present

## 2019-01-12 DIAGNOSIS — M2560 Stiffness of unspecified joint, not elsewhere classified: Secondary | ICD-10-CM | POA: Diagnosis not present

## 2019-01-12 DIAGNOSIS — M48061 Spinal stenosis, lumbar region without neurogenic claudication: Secondary | ICD-10-CM | POA: Diagnosis not present

## 2019-01-12 DIAGNOSIS — M545 Low back pain: Secondary | ICD-10-CM | POA: Diagnosis not present

## 2019-01-12 DIAGNOSIS — M6281 Muscle weakness (generalized): Secondary | ICD-10-CM | POA: Diagnosis not present

## 2019-01-15 DIAGNOSIS — M6281 Muscle weakness (generalized): Secondary | ICD-10-CM | POA: Diagnosis not present

## 2019-01-15 DIAGNOSIS — M545 Low back pain: Secondary | ICD-10-CM | POA: Diagnosis not present

## 2019-01-15 DIAGNOSIS — M48061 Spinal stenosis, lumbar region without neurogenic claudication: Secondary | ICD-10-CM | POA: Diagnosis not present

## 2019-01-15 DIAGNOSIS — M2560 Stiffness of unspecified joint, not elsewhere classified: Secondary | ICD-10-CM | POA: Diagnosis not present

## 2019-01-19 DIAGNOSIS — M7542 Impingement syndrome of left shoulder: Secondary | ICD-10-CM | POA: Diagnosis not present

## 2019-01-19 DIAGNOSIS — M25812 Other specified joint disorders, left shoulder: Secondary | ICD-10-CM | POA: Diagnosis not present

## 2019-01-22 DIAGNOSIS — M6281 Muscle weakness (generalized): Secondary | ICD-10-CM | POA: Diagnosis not present

## 2019-01-22 DIAGNOSIS — M48061 Spinal stenosis, lumbar region without neurogenic claudication: Secondary | ICD-10-CM | POA: Diagnosis not present

## 2019-01-22 DIAGNOSIS — M545 Low back pain: Secondary | ICD-10-CM | POA: Diagnosis not present

## 2019-01-22 DIAGNOSIS — M2560 Stiffness of unspecified joint, not elsewhere classified: Secondary | ICD-10-CM | POA: Diagnosis not present

## 2019-01-26 DIAGNOSIS — M48061 Spinal stenosis, lumbar region without neurogenic claudication: Secondary | ICD-10-CM | POA: Diagnosis not present

## 2019-01-26 DIAGNOSIS — M545 Low back pain: Secondary | ICD-10-CM | POA: Diagnosis not present

## 2019-01-26 DIAGNOSIS — M6281 Muscle weakness (generalized): Secondary | ICD-10-CM | POA: Diagnosis not present

## 2019-01-26 DIAGNOSIS — M2560 Stiffness of unspecified joint, not elsewhere classified: Secondary | ICD-10-CM | POA: Diagnosis not present

## 2019-02-02 DIAGNOSIS — M6281 Muscle weakness (generalized): Secondary | ICD-10-CM | POA: Diagnosis not present

## 2019-02-02 DIAGNOSIS — M48061 Spinal stenosis, lumbar region without neurogenic claudication: Secondary | ICD-10-CM | POA: Diagnosis not present

## 2019-02-02 DIAGNOSIS — M545 Low back pain: Secondary | ICD-10-CM | POA: Diagnosis not present

## 2019-02-02 DIAGNOSIS — M2560 Stiffness of unspecified joint, not elsewhere classified: Secondary | ICD-10-CM | POA: Diagnosis not present

## 2019-02-16 DIAGNOSIS — M545 Low back pain: Secondary | ICD-10-CM | POA: Diagnosis not present

## 2019-02-16 DIAGNOSIS — M2560 Stiffness of unspecified joint, not elsewhere classified: Secondary | ICD-10-CM | POA: Diagnosis not present

## 2019-02-16 DIAGNOSIS — M48061 Spinal stenosis, lumbar region without neurogenic claudication: Secondary | ICD-10-CM | POA: Diagnosis not present

## 2019-02-16 DIAGNOSIS — M6281 Muscle weakness (generalized): Secondary | ICD-10-CM | POA: Diagnosis not present

## 2019-02-19 DIAGNOSIS — M545 Low back pain: Secondary | ICD-10-CM | POA: Diagnosis not present

## 2019-02-19 DIAGNOSIS — M6281 Muscle weakness (generalized): Secondary | ICD-10-CM | POA: Diagnosis not present

## 2019-02-19 DIAGNOSIS — M2560 Stiffness of unspecified joint, not elsewhere classified: Secondary | ICD-10-CM | POA: Diagnosis not present

## 2019-02-19 DIAGNOSIS — M48061 Spinal stenosis, lumbar region without neurogenic claudication: Secondary | ICD-10-CM | POA: Diagnosis not present

## 2019-03-04 DIAGNOSIS — M25812 Other specified joint disorders, left shoulder: Secondary | ICD-10-CM | POA: Diagnosis not present

## 2019-03-04 DIAGNOSIS — M25512 Pain in left shoulder: Secondary | ICD-10-CM | POA: Diagnosis not present

## 2019-03-11 DIAGNOSIS — M48062 Spinal stenosis, lumbar region with neurogenic claudication: Secondary | ICD-10-CM | POA: Diagnosis not present

## 2019-03-11 DIAGNOSIS — Z6835 Body mass index (BMI) 35.0-35.9, adult: Secondary | ICD-10-CM | POA: Diagnosis not present

## 2019-03-11 DIAGNOSIS — M4150 Other secondary scoliosis, site unspecified: Secondary | ICD-10-CM | POA: Diagnosis not present

## 2019-03-19 NOTE — Patient Instructions (Addendum)
YOU NEED TO HAVE A COVID 19 TEST ON_______ @_______ , THIS TEST MUST BE DONE BEFORE SURGERY, COME TO [place] ENTRANCE. ONCE YOUR COVID TEST IS COMPLETED, PLEASE BEGIN THE QUARANTINE INSTRUCTIONS AS OUTLINED IN YOUR HANDOUT.                Justin Trujillo    Your procedure is scheduled on: 03-26-2019  Report to [facility] Main  Entrance  Report to admitting at 735  AM      Call this number if you have problems the morning of surgery [phone]    Remember: [place], NO [place].   NO SOLID FOOD AFTER MIDNIGHT THE NIGHT PRIOR TO SURGERY. NOTHING BY MOUTH EXCEPT CLEAR LIQUIDS UNTIL 705 AM. PLEASE FINISH G2 DRINK PER SURGEON ORDER 3 HOURS PRIOR TO SCHEDULED SURGERY TIME WHICH NEEDS TO BE COMPLETED AT 705 AM.   CLEAR LIQUID DIET   Foods Allowed                                                                     Foods Excluded  Coffee and tea, regular and decaf                             liquids that you cannot  Plain Jell-O in any flavor                                             see through such as: Fruit ices (not with fruit pulp)                                     milk, soups, orange juice  Iced Popsicles                                    All solid food Carbonated beverages, regular and diet                                    Cranberry, grape and apple juices Sports drinks like Gatorade Lightly seasoned clear broth or consume(fat free) Sugar, honey syrup  Sample Menu Breakfast                                Lunch                                     Supper Cranberry juice                    Beef broth  Chicken broth Jell-O                                     Grape juice                           Apple juice Coffee or tea                        Jell-O                                      Popsicle  Coffee or tea                        Coffee or tea  _____________________________________________________________________     Take these medicines the morning of surgery with A SIP OF WATER: zanaflex if needed, gabapentin, lipitor, tylenol  DO NOT TAKE ANY DIABETIC MEDICATIONS DAY OF YOUR SURGERY     How to Manage Your Diabetes Before and After Surgery  Why is it important to control my blood sugar before and after surgery? . Improving blood sugar levels before and after surgery helps healing and can limit problems. . A way of improving blood sugar control is eating a healthy diet by: o  Eating less sugar and carbohydrates o  Increasing activity/exercise o  Talking with your doctor about reaching your blood sugar goals . High blood sugars (greater than 180 mg/dL) can raise your risk of infections and slow your recovery, so you will need to focus on controlling your diabetes during the weeks before surgery. . Make sure that the doctor who takes care of your diabetes knows about your planned surgery including the date and location.  How do I manage my blood sugar before surgery? . Check your blood sugar at least 4 times a day, starting 2 days before surgery, to make sure that the level is not too high or low. o Check your blood sugar the morning of your surgery when you wake up and every 2 hours until you get to the Short Stay unit. . If your blood sugar is less than 70 mg/dL, you will need to treat for low blood sugar: o Do not take insulin. o Treat a low blood sugar (less than 70 mg/dL) with  cup of clear juice (cranberry or apple), 4 glucose tablets, OR glucose gel. o Recheck blood sugar in 15 minutes after treatment (to make sure it is greater than 70 mg/dL). If your blood sugar is not greater than 70 mg/dL on recheck, call [phone] for further instructions. . Report your blood sugar to the short stay nurse when you get to Short Stay.  . If you are admitted to the hospital after  surgery: o Your blood sugar will be checked by the staff and you will probably be given insulin after surgery (instead of oral diabetes medicines) to make sure you have good blood sugar levels. o The goal for blood sugar control after surgery is 80-180 mg/dL.   WHAT DO I DO ABOUT MY DIABETES MEDICATION?  Marland Kitchen Do not take oral diabetes medicines (pills) the morning of surgery.  . THE  BEFORE SURGERY, take 70/30 if needed am of surgery        . THE MORNING  OF SURGERY, take 0  units of   insulin.  . The day of surgery, do not take other diabetes injectables, including Byetta (exenatide), Bydureon (exenatide ER), Victoza (liraglutide), or Trulicity (dulaglutide).  . If your CBG is greater than 220 mg/dL, you may take  of your sliding scale  . (correction) dose of insulin.   Reviewed and Endorsed by [facility] Patient Education Committee, August 2015                           You may not have any metal on your body including hair pins and              piercings  Do not wear jewelry, make-up, lotions, powders or perfumes, deodorant                        Men may shave face and neck.   Do not bring valuables to the hospital. [place].  Contacts, dentures or bridgework may not be worn into surgery. .    _____________________________________________________________________             [facility] - Preparing for Surgery Before surgery, you can play an important role.  Because skin is not sterile, your skin needs to be as free of germs as possible.  You can reduce the number of germs on your skin by washing with CHG (chlorahexidine gluconate) soap before surgery.  CHG is an antiseptic cleaner which kills germs and bonds with the skin to continue killing germs even after washing. Please DO NOT use if you have an allergy to CHG or antibacterial soaps.  If your skin becomes reddened/irritated stop using the CHG and inform your nurse when you  arrive at Short Stay. Do not shave (including legs and underarms) for at least 48 hours prior to the first CHG shower.  You may shave your face/neck. Please follow these instructions carefully:  1.  Shower with CHG Soap the night before surgery and the  morning of Surgery.  2.  If you choose to wash your hair, wash your hair first as usual with your  normal  shampoo.  3.  After you shampoo, rinse your hair and body thoroughly to remove the  shampoo.                           4.  Use CHG as you would any other liquid soap.  You can apply chg directly  to the skin and wash                       Gently with a scrungie or clean washcloth.  5.  Apply the CHG Soap to your body ONLY FROM THE NECK DOWN.   Do not use on face/ open                           Wound or open sores. Avoid contact with eyes, ears mouth and genitals (private parts).                       Wash face,  Genitals (private parts) with your normal soap.             6.  Wash thoroughly, paying special attention to the area where your surgery  will be performed.  7.  Thoroughly rinse your body with warm water from the neck down.  8.  DO NOT shower/wash with your normal soap after using and rinsing off  the CHG Soap.                9.  Pat yourself dry with a clean towel.            10.  Wear clean pajamas.            11.  Place clean sheets on your bed the night of your first shower and do not  sleep with pets. Day of Surgery : Do not apply any lotions/deodorants the morning of surgery.  Please wear clean clothes to the hospital/surgery center.  FAILURE TO FOLLOW THESE INSTRUCTIONS MAY RESULT IN THE CANCELLATION OF YOUR SURGERY PATIENT SIGNATURE_________________________________  NURSE SIGNATURE__________________________________  ________________________________________________________________________   Adam Phenix  An incentive spirometer is a tool that can help keep your lungs clear and active. This tool measures how  well you are filling your lungs with each breath. Taking long deep breaths may help reverse or decrease the chance of developing breathing (pulmonary) problems (especially infection) following:  A long period of time when you are unable to move or be active. BEFORE THE PROCEDURE   If the spirometer includes an indicator to show your best effort, your nurse or respiratory therapist will set it to a desired goal.  If possible, sit up straight or lean slightly forward. Try not to slouch.  Hold the incentive spirometer in an upright position. INSTRUCTIONS FOR USE  1. Sit on the edge of your bed if possible, or sit up as far as you can in bed or on a chair. 2. Hold the incentive spirometer in an upright position. 3. Breathe out normally. 4. Place the mouthpiece in your mouth and seal your lips tightly around it. 5. Breathe in slowly and as deeply as possible, raising the piston or the ball toward the top of the column. 6. Hold your breath for 3-5 seconds or for as long as possible. Allow the piston or ball to fall to the bottom of the column. 7. Remove the mouthpiece from your mouth and breathe out normally. 8. Rest for a few seconds and repeat Steps 1 through 7 at least 10 times every 1-2 hours when you are awake. Take your time and take a few normal breaths between deep breaths. 9. The spirometer may include an indicator to show your best effort. Use the indicator as a goal to work toward during each repetition. 10. After each set of 10 deep breaths, practice coughing to be sure your lungs are clear. If you have an incision (the cut made at the time of surgery), support your incision when coughing by placing a pillow or rolled up towels firmly against it. Once you are able to get out of bed, walk around indoors and cough well. You may stop using the incentive spirometer when instructed by your caregiver.  RISKS AND COMPLICATIONS  Take your time so you do not get dizzy or light-headed.  If you  are in pain, you may need to take or ask for pain medication before doing incentive spirometry. It is harder to take a deep breath if you are having pain. AFTER USE  Rest and breathe slowly and easily.  It can be helpful to keep track of a log of your progress.  Your caregiver can provide you with a simple table to help with this. If you are using the spirometer at home, follow these instructions: [place] IF:   You are having difficultly using the spirometer.  You have trouble using the spirometer as often as instructed.  Your pain medication is not giving enough relief while using the spirometer.  You develop fever of 100.5 F (38.1 C) or higher. SEEK IMMEDIATE MEDICAL CARE IF:   You cough up bloody sputum that had not been present before.  You develop fever of 102 F (38.9 C) or greater.  You develop worsening pain at or near the incision site. MAKE SURE YOU:   Understand these instructions.  Will watch your condition.  Will get help right away if you are not doing well or get worse. Document Released: 01/14/2007 Document Revised: 11/26/2011 Document Reviewed: 03/17/2007 [facility] Patient Information 2014 [place], [place].   ________________________________________________________________________

## 2019-03-23 ENCOUNTER — Encounter (HOSPITAL_COMMUNITY)
Admission: RE | Admit: 2019-03-23 | Discharge: 2019-03-23 | Disposition: A | Discharge status: Home / Self Care | Payer: PPO | Source: Ambulatory Visit | Attending: Orthopedic Surgery | Admitting: Orthopedic Surgery

## 2019-03-23 ENCOUNTER — Other Ambulatory Visit: Payer: Self-pay

## 2019-03-23 ENCOUNTER — Encounter (HOSPITAL_COMMUNITY): Payer: Self-pay

## 2019-03-23 ENCOUNTER — Other Ambulatory Visit (HOSPITAL_COMMUNITY)
Admission: RE | Admit: 2019-03-23 | Discharge: 2019-03-23 | Disposition: A | Discharge status: Home / Self Care | Payer: PPO | Source: Ambulatory Visit | Attending: Orthopedic Surgery | Admitting: Orthopedic Surgery

## 2019-03-23 DIAGNOSIS — E669 Obesity, unspecified: Secondary | ICD-10-CM | POA: Diagnosis present

## 2019-03-23 DIAGNOSIS — F17200 Nicotine dependence, unspecified, uncomplicated: Secondary | ICD-10-CM | POA: Diagnosis not present

## 2019-03-23 DIAGNOSIS — M545 Low back pain: Secondary | ICD-10-CM | POA: Diagnosis not present

## 2019-03-23 DIAGNOSIS — Z794 Long term (current) use of insulin: Secondary | ICD-10-CM | POA: Diagnosis not present

## 2019-03-23 DIAGNOSIS — Z91040 Latex allergy status: Secondary | ICD-10-CM | POA: Diagnosis not present

## 2019-03-23 DIAGNOSIS — Z91041 Radiographic dye allergy status: Secondary | ICD-10-CM | POA: Diagnosis not present

## 2019-03-23 DIAGNOSIS — E119 Type 2 diabetes mellitus without complications: Secondary | ICD-10-CM | POA: Diagnosis present

## 2019-03-23 DIAGNOSIS — G8918 Other acute postprocedural pain: Secondary | ICD-10-CM | POA: Diagnosis not present

## 2019-03-23 DIAGNOSIS — Z880 Allergy status to penicillin: Secondary | ICD-10-CM | POA: Diagnosis not present

## 2019-03-23 DIAGNOSIS — Z96651 Presence of right artificial knee joint: Secondary | ICD-10-CM | POA: Diagnosis present

## 2019-03-23 DIAGNOSIS — Z791 Long term (current) use of non-steroidal anti-inflammatories (NSAID): Secondary | ICD-10-CM | POA: Diagnosis not present

## 2019-03-23 DIAGNOSIS — Z881 Allergy status to other antibiotic agents status: Secondary | ICD-10-CM | POA: Diagnosis not present

## 2019-03-23 DIAGNOSIS — F17210 Nicotine dependence, cigarettes, uncomplicated: Secondary | ICD-10-CM | POA: Diagnosis present

## 2019-03-23 DIAGNOSIS — I2510 Atherosclerotic heart disease of native coronary artery without angina pectoris: Secondary | ICD-10-CM | POA: Diagnosis present

## 2019-03-23 DIAGNOSIS — M1990 Unspecified osteoarthritis, unspecified site: Secondary | ICD-10-CM | POA: Diagnosis present

## 2019-03-23 DIAGNOSIS — M25512 Pain in left shoulder: Secondary | ICD-10-CM | POA: Diagnosis present

## 2019-03-23 DIAGNOSIS — M25812 Other specified joint disorders, left shoulder: Secondary | ICD-10-CM | POA: Diagnosis not present

## 2019-03-23 DIAGNOSIS — I10 Essential (primary) hypertension: Secondary | ICD-10-CM | POA: Diagnosis present

## 2019-03-23 DIAGNOSIS — Z91013 Allergy to seafood: Secondary | ICD-10-CM | POA: Diagnosis not present

## 2019-03-23 DIAGNOSIS — Z7984 Long term (current) use of oral hypoglycemic drugs: Secondary | ICD-10-CM | POA: Diagnosis not present

## 2019-03-23 DIAGNOSIS — E1121 Type 2 diabetes mellitus with diabetic nephropathy: Secondary | ICD-10-CM | POA: Diagnosis not present

## 2019-03-23 DIAGNOSIS — Z8249 Family history of ischemic heart disease and other diseases of the circulatory system: Secondary | ICD-10-CM | POA: Diagnosis not present

## 2019-03-23 DIAGNOSIS — Z7982 Long term (current) use of aspirin: Secondary | ICD-10-CM | POA: Diagnosis not present

## 2019-03-23 DIAGNOSIS — J438 Other emphysema: Secondary | ICD-10-CM | POA: Diagnosis present

## 2019-03-23 DIAGNOSIS — Z6837 Body mass index (BMI) 37.0-37.9, adult: Secondary | ICD-10-CM | POA: Diagnosis not present

## 2019-03-23 DIAGNOSIS — Z825 Family history of asthma and other chronic lower respiratory diseases: Secondary | ICD-10-CM | POA: Diagnosis not present

## 2019-03-23 DIAGNOSIS — G4733 Obstructive sleep apnea (adult) (pediatric): Secondary | ICD-10-CM | POA: Diagnosis present

## 2019-03-23 DIAGNOSIS — Z1159 Encounter for screening for other viral diseases: Secondary | ICD-10-CM | POA: Diagnosis not present

## 2019-03-23 DIAGNOSIS — E785 Hyperlipidemia, unspecified: Secondary | ICD-10-CM | POA: Diagnosis present

## 2019-03-23 DIAGNOSIS — E291 Testicular hypofunction: Secondary | ICD-10-CM | POA: Diagnosis present

## 2019-03-23 DIAGNOSIS — M75102 Unspecified rotator cuff tear or rupture of left shoulder, not specified as traumatic: Secondary | ICD-10-CM | POA: Diagnosis present

## 2019-03-23 LAB — BASIC METABOLIC PANEL
Anion gap: 8 (ref 5–15)
BUN: 23 mg/dL (ref 8–23)
CO2: 26 mmol/L (ref 22–32)
Calcium: 9.5 mg/dL (ref 8.9–10.3)
Chloride: 104 mmol/L (ref 98–111)
Creatinine, Ser: 0.91 mg/dL (ref 0.61–1.24)
GFR calc Af Amer: >60 mL/min (ref >60)
GFR calc non Af Amer: >60 mL/min (ref >60)
Glucose, Bld: 94 mg/dL (ref 70–99)
Potassium: 4.7 mmol/L (ref 3.5–5.1)
Sodium: 138 mmol/L (ref 135–145)

## 2019-03-23 LAB — CBC
HCT: 45 % (ref 39.0–52.0)
Hemoglobin: 14.8 g/dL (ref 13.0–17.0)
MCH: 30.3 pg (ref 26.0–34.0)
MCHC: 32.9 g/dL (ref 30.0–36.0)
MCV: 92.2 fL (ref 80.0–100.0)
Platelets: 194 10*3/uL (ref 150–400)
RBC: 4.88 MIL/uL (ref 4.22–5.81)
RDW: 13.8 % (ref 11.5–15.5)
WBC: 10.7 10*3/uL — ABNORMAL HIGH (ref 4.0–10.5)
nRBC: 0 % (ref 0.0–0.2)

## 2019-03-23 LAB — SURGICAL PCR SCREEN
MRSA, PCR: NEGATIVE
Staphylococcus aureus: NEGATIVE

## 2019-03-23 LAB — HEMOGLOBIN A1C
Hgb A1c MFr Bld: 7.5 % — ABNORMAL HIGH (ref 4.8–5.6)
Mean Plasma Glucose: 168.55 mg/dL

## 2019-03-23 LAB — GLUCOSE, CAPILLARY: Glucose-Capillary: 110 mg/dL — ABNORMAL HIGH (ref 70–99)

## 2019-03-23 NOTE — Progress Notes (Signed)
Dr. Smitty Cords 03-07-18   ekg 03-08-19  Stress 03-18-18

## 2019-03-24 LAB — SARS CORONAVIRUS 2 (TAT 6-24 HRS): SARS Coronavirus 2: NEGATIVE

## 2019-03-24 NOTE — Progress Notes (Signed)
Anesthesia Chart Review   Case: [id number] Date/Time: 03/26/19 0953   Procedure: REVERSE SHOULDER ARTHROPLASTY (Left ) - 168min   Anesthesia type: General   Pre-op diagnosis: left shoulder rotator cuff tear arthropathy   Location: [facility] ROOM 06 / [facility]   Surgeon: Justice Britain, MD      DISCUSSION:73 yo male current smoker. Pertinent hx includes non obstructive CAD, HTN, HLD, COPD, DOE, OSA not on cpap, IDDMII, left shoulder rotator cuff tear scheduled for above proceudre 03/26/2019 with Dr. Justice Britain.    Pt was seen 03/07/2018 by Dr. Lovena Le for preop risk assessment for TKA. Per his note the pt has multiple cardiac risk factors and a history of chest tightness with emotional stress. Prior cardiac cath with nonobstructive coronary disease. Due to pt being sedentary 2/2 knee pain, it was recommended he undergo nuclear stress.   Nuclear stress 03/18/2018 showed: Small size, mild intensity fixed apical perfusion defect, likely attenuation artifact. No reversible ischemia. LVEF 42% with moderate global hypokinesis. This is an intermediate risk study.  Pt was cleared by Dr. Lovena Le to undergo TKA.  S/p L 3-4 fusion 10/28/2018 with no anesthesia complications noted.  Discussed with Dr. Kalman Shan.  Anticipate pt can proceed with planned procedure barring acute status change.   VS: BP 136/85 (BP Location: Right Arm)   Pulse 96   Temp 37.2 C (Oral)   Resp 18   Ht 5\' 6"  (1.676 m)   Wt 103.1 kg   SpO2 100%   BMI 36.70 kg/m   PROVIDERS: Carol Ada, MD  Cristopher Peru, MD is Cardiologist  LABS: Labs reviewed: Acceptable for surgery. (all labs ordered are listed, but only abnormal results are displayed)  Labs Reviewed  GLUCOSE, CAPILLARY - Abnormal; Notable for the following components:      Result Value   Glucose-Capillary 110 (*)    All other components within normal limits  HEMOGLOBIN A1C - Abnormal; Notable for the following components:   Hgb A1c MFr Bld 7.5 (*)    All other components  within normal limits  CBC - Abnormal; Notable for the following components:   WBC 10.7 (*)    All other components within normal limits  SURGICAL PCR SCREEN  BASIC METABOLIC PANEL     IMAGES:   EKG: 03/23/2019 Rate 89 bpm Unusual P axis, possible ectopic atrial rhythm Right axis deviation Incomplete right bundle branch block Abnormal ECG  CV: Stress Test 03/18/2018  Nuclear stress EF: 42%.  No T wave inversion was noted during stress.  There was no ST segment deviation noted during stress.  Defect 1: There is a small defect of mild severity.  This is an intermediate risk study.   Small size, mild intensity fixed apical perfusion defect, likely attenuation artifact. No reversible ischemia. LVEF 42% with moderate global hypokinesis. This is an intermediate risk study. Past Medical History:  Diagnosis Date  . Arthritis   . Coronary atherosclerosis of native coronary artery   . Hypogonadism male   . Obstructive chronic bronchitis with exacerbation ([place])   . Obstructive sleep apnea (adult) (pediatric)    no cpap  . Other and unspecified hyperlipidemia   . Other emphysema ([place])   . Proteinuria 2019   has seen a nephrologist  . Shortness of breath    with excertion  . Type II or unspecified type diabetes mellitus without mention of complication, not stated as uncontrolled    type 2  . Unspecified essential hypertension     Past Surgical History:  Procedure Laterality Date  . CARDIAC CATHETERIZATION    . IR KYPHO LUMBAR INC FX REDUCE BONE BX UNI/BIL CANNULATION INC/IMAGING  06/13/2018  . L lens implant    . R knee replacement x2    . TONSILLECTOMY    . TONSILLECTOMY AND ADENOIDECTOMY    . vascectomy      MEDICATIONS: . acetaminophen (TYLENOL) 500 MG tablet  . ALPRAZolam (XANAX) 0.5 MG tablet  . aspirin EC 81 MG tablet  . atorvastatin (LIPITOR) 40 MG tablet  . Calcium Carb-Cholecalciferol (CALCIUM 600+D3 PO)  . celecoxib (CELEBREX) 200 MG capsule  .  Cholecalciferol (VITAMIN D) 50 MCG (2000 UT) tablet  . gabapentin (NEURONTIN) 300 MG capsule  . insulin NPH-regular Human (NOVOLIN 70/30) (70-30) 100 UNIT/ML injection  . linaclotide (LINZESS) 145 MCG CAPS capsule  . lisinopril-hydrochlorothiazide (ZESTORETIC) 20-12.5 MG tablet  . metFORMIN (GLUCOPHAGE) 1000 MG tablet  . oxyCODONE 30 MG 12 hr tablet  . oxyCODONE-acetaminophen (PERCOCET) 10-325 MG tablet  . rOPINIRole (REQUIP) 0.5 MG tablet  . testosterone cypionate (DEPOTESTOSTERONE CYPIONATE) 200 MG/ML injection  . tiZANidine (ZANAFLEX) 4 MG tablet  . trolamine salicylate (ASPERCREME) 10 % cream   No current facility-administered medications for this encounter.     Maia Plan [facility] Pre-Surgical Testing [phone] 03/24/19  3:13 PM

## 2019-03-24 NOTE — Anesthesia Preprocedure Evaluation (Addendum)
Anesthesia Evaluation  Patient identified by MRN, date of birth, ID band Patient awake    Reviewed: Allergy & Precautions, NPO status , Patient's Chart, lab work & pertinent test results  Airway Mallampati: IV  TM Distance: >3 FB Neck ROM: Full    Dental no notable dental hx.    Pulmonary sleep apnea , COPD, Current Smoker,    Pulmonary exam normal breath sounds clear to auscultation       Cardiovascular hypertension, Pt. on medications + CAD  Normal cardiovascular exam Rhythm:Regular Rate:Normal  ECG: rate 89. Unusual P axis, possible ectopic atrial rhythm. Right axis deviation Incomplete right bundle branch block   Nuclear stress EF: 42%. No T wave inversion was noted during stress. There was no ST segment deviation noted during stress. Defect 1: There is a small defect of mild severity. This is an intermediate risk study.   Small size, mild intensity fixed apical perfusion defect, likely attenuation artifact. No reversible ischemia. LVEF 42% with moderate global hypokinesis. This is an intermediate risk study.  Sees cardiologist Lovena Le)   Neuro/Psych negative neurological ROS  negative psych ROS   GI/Hepatic negative GI ROS, Neg liver ROS,   Endo/Other  diabetes, Oral Hypoglycemic Agents  Renal/GU negative Renal ROS     Musculoskeletal negative musculoskeletal ROS (+)   Abdominal (+) + obese,   Peds  Hematology HLD   Anesthesia Other Findings left shoulder rotator cuff tear arthropathy  Reproductive/Obstetrics                           Anesthesia Physical Anesthesia Plan  ASA: III  Anesthesia Plan: General and Regional   Post-op Pain Management: GA combined w/ Regional for post-op pain   Induction: Intravenous  PONV Risk Score and Plan: 1 and Ondansetron, Dexamethasone and Treatment may vary due to age or medical condition  Airway Management Planned: Oral ETT  Additional  Equipment:   Intra-op Plan:   Post-operative Plan: Extubation in OR  Informed Consent: I have reviewed the patients History and Physical, chart, labs and discussed the procedure including the risks, benefits and alternatives for the proposed anesthesia with the patient or authorized representative who has indicated his/her understanding and acceptance.     Dental advisory given  Plan Discussed with: CRNA  Anesthesia Plan Comments: (Reviewed PAT note 03/23/2019 by Konrad Felix, PA-C)       Anesthesia Quick Evaluation

## 2019-03-26 ENCOUNTER — Inpatient Hospital Stay (HOSPITAL_COMMUNITY): Payer: PPO | Admitting: Certified Registered Nurse Anesthetist

## 2019-03-26 ENCOUNTER — Other Ambulatory Visit: Payer: Self-pay

## 2019-03-26 ENCOUNTER — Inpatient Hospital Stay (HOSPITAL_COMMUNITY)
Admission: RE | Admit: 2019-03-26 | Discharge: 2019-03-27 | DRG: 483 | Disposition: A | Discharge status: Home / Self Care | Payer: PPO | Attending: Orthopedic Surgery | Admitting: Orthopedic Surgery

## 2019-03-26 ENCOUNTER — Encounter (HOSPITAL_COMMUNITY): Payer: Self-pay | Admitting: *Deleted

## 2019-03-26 ENCOUNTER — Inpatient Hospital Stay (HOSPITAL_COMMUNITY): Payer: PPO | Admitting: Physician Assistant

## 2019-03-26 ENCOUNTER — Encounter (HOSPITAL_COMMUNITY)
Admission: RE | Disposition: A | Discharge status: Home / Self Care | Payer: Self-pay | Source: Home / Self Care | Attending: Orthopedic Surgery

## 2019-03-26 DIAGNOSIS — Z79899 Other long term (current) drug therapy: Secondary | ICD-10-CM

## 2019-03-26 DIAGNOSIS — Z880 Allergy status to penicillin: Secondary | ICD-10-CM

## 2019-03-26 DIAGNOSIS — M75102 Unspecified rotator cuff tear or rupture of left shoulder, not specified as traumatic: Principal | ICD-10-CM | POA: Diagnosis present

## 2019-03-26 DIAGNOSIS — E669 Obesity, unspecified: Secondary | ICD-10-CM | POA: Diagnosis present

## 2019-03-26 DIAGNOSIS — Z91040 Latex allergy status: Secondary | ICD-10-CM

## 2019-03-26 DIAGNOSIS — I2510 Atherosclerotic heart disease of native coronary artery without angina pectoris: Secondary | ICD-10-CM | POA: Diagnosis present

## 2019-03-26 DIAGNOSIS — F17210 Nicotine dependence, cigarettes, uncomplicated: Secondary | ICD-10-CM | POA: Diagnosis present

## 2019-03-26 DIAGNOSIS — Z96651 Presence of right artificial knee joint: Secondary | ICD-10-CM | POA: Diagnosis present

## 2019-03-26 DIAGNOSIS — Z7982 Long term (current) use of aspirin: Secondary | ICD-10-CM | POA: Diagnosis not present

## 2019-03-26 DIAGNOSIS — I10 Essential (primary) hypertension: Secondary | ICD-10-CM | POA: Diagnosis present

## 2019-03-26 DIAGNOSIS — Z91013 Allergy to seafood: Secondary | ICD-10-CM

## 2019-03-26 DIAGNOSIS — Z794 Long term (current) use of insulin: Secondary | ICD-10-CM | POA: Diagnosis not present

## 2019-03-26 DIAGNOSIS — Z91041 Radiographic dye allergy status: Secondary | ICD-10-CM | POA: Diagnosis not present

## 2019-03-26 DIAGNOSIS — Z1159 Encounter for screening for other viral diseases: Secondary | ICD-10-CM | POA: Diagnosis not present

## 2019-03-26 DIAGNOSIS — M1990 Unspecified osteoarthritis, unspecified site: Secondary | ICD-10-CM | POA: Diagnosis present

## 2019-03-26 DIAGNOSIS — G4733 Obstructive sleep apnea (adult) (pediatric): Secondary | ICD-10-CM | POA: Diagnosis present

## 2019-03-26 DIAGNOSIS — Z881 Allergy status to other antibiotic agents status: Secondary | ICD-10-CM

## 2019-03-26 DIAGNOSIS — J438 Other emphysema: Secondary | ICD-10-CM | POA: Diagnosis present

## 2019-03-26 DIAGNOSIS — Z791 Long term (current) use of non-steroidal anti-inflammatories (NSAID): Secondary | ICD-10-CM

## 2019-03-26 DIAGNOSIS — Z8249 Family history of ischemic heart disease and other diseases of the circulatory system: Secondary | ICD-10-CM

## 2019-03-26 DIAGNOSIS — E291 Testicular hypofunction: Secondary | ICD-10-CM | POA: Diagnosis present

## 2019-03-26 DIAGNOSIS — Z6837 Body mass index (BMI) 37.0-37.9, adult: Secondary | ICD-10-CM | POA: Diagnosis not present

## 2019-03-26 DIAGNOSIS — E785 Hyperlipidemia, unspecified: Secondary | ICD-10-CM | POA: Diagnosis present

## 2019-03-26 DIAGNOSIS — Z825 Family history of asthma and other chronic lower respiratory diseases: Secondary | ICD-10-CM

## 2019-03-26 DIAGNOSIS — M25512 Pain in left shoulder: Secondary | ICD-10-CM | POA: Diagnosis present

## 2019-03-26 DIAGNOSIS — Z96612 Presence of left artificial shoulder joint: Secondary | ICD-10-CM

## 2019-03-26 DIAGNOSIS — E119 Type 2 diabetes mellitus without complications: Secondary | ICD-10-CM | POA: Diagnosis present

## 2019-03-26 HISTORY — PX: REVERSE SHOULDER ARTHROPLASTY: SHX5054

## 2019-03-26 LAB — GLUCOSE, CAPILLARY
Glucose-Capillary: 146 mg/dL — ABNORMAL HIGH (ref 70–99)
Glucose-Capillary: 147 mg/dL — ABNORMAL HIGH (ref 70–99)
Glucose-Capillary: 208 mg/dL — ABNORMAL HIGH (ref 70–99)
Glucose-Capillary: 171 mg/dL — ABNORMAL HIGH (ref 70–99)

## 2019-03-26 SURGERY — ARTHROPLASTY, SHOULDER, TOTAL, REVERSE
Anesthesia: Regional | Laterality: Left

## 2019-03-26 MED ORDER — BUPIVACAINE HCL (PF) 0.5 % IJ SOLN
INTRAMUSCULAR | Status: DC | PRN
  Administered 2019-03-26: 15 mL via PERINEURAL

## 2019-03-26 MED ORDER — FENTANYL CITRATE (PF) 100 MCG/2ML IJ SOLN: 25.0000 ug | INTRAMUSCULAR | Status: DC | PRN

## 2019-03-26 MED ORDER — FENTANYL CITRATE (PF) 100 MCG/2ML IJ SOLN
50.0000 ug | Freq: Once | INTRAMUSCULAR | Status: AC
  Administered 2019-03-26: 50 ug via INTRAVENOUS
  Filled 2019-03-26: qty 2

## 2019-03-26 MED ORDER — ONDANSETRON HCL 4 MG/2ML IJ SOLN: 4.0000 mg | Freq: Once | INTRAMUSCULAR | Status: DC | PRN

## 2019-03-26 MED ORDER — INSULIN ASPART 100 UNIT/ML ~~LOC~~ SOLN
0.0000 [IU] | Freq: Three times a day (TID) | SUBCUTANEOUS | Status: DC
  Administered 2019-03-26: 5 [IU] via SUBCUTANEOUS

## 2019-03-26 MED ORDER — OXYCODONE HCL 5 MG PO TABS: 10.0000 mg | ORAL_TABLET | ORAL | Status: DC | PRN

## 2019-03-26 MED ORDER — ASPIRIN EC 81 MG PO TBEC
81.0000 mg | DELAYED_RELEASE_TABLET | Freq: Every day | ORAL | Status: DC
  Administered 2019-03-27: 81 mg via ORAL
  Filled 2019-03-26: qty 1

## 2019-03-26 MED ORDER — LIDOCAINE 2% (20 MG/ML) 5 ML SYRINGE
INTRAMUSCULAR | Status: DC | PRN
  Administered 2019-03-26: 100 mg via INTRAVENOUS

## 2019-03-26 MED ORDER — CLINDAMYCIN PHOSPHATE 900 MG/50ML IV SOLN
900.0000 mg | INTRAVENOUS | Status: AC
  Administered 2019-03-26: 900 mg via INTRAVENOUS
  Filled 2019-03-26: qty 50

## 2019-03-26 MED ORDER — METOCLOPRAMIDE HCL 5 MG PO TABS: 5.0000 mg | ORAL_TABLET | Freq: Three times a day (TID) | ORAL | Status: DC | PRN

## 2019-03-26 MED ORDER — LISINOPRIL 20 MG PO TABS
20.0000 mg | ORAL_TABLET | Freq: Every day | ORAL | Status: DC
  Filled 2019-03-26: qty 1

## 2019-03-26 MED ORDER — ACETAMINOPHEN 325 MG PO TABS: 325.0000 mg | ORAL_TABLET | Freq: Four times a day (QID) | ORAL | Status: DC | PRN

## 2019-03-26 MED ORDER — MENTHOL 3 MG MT LOZG: 1.0000 | LOZENGE | OROMUCOSAL | Status: DC | PRN

## 2019-03-26 MED ORDER — HYDROCHLOROTHIAZIDE 12.5 MG PO CAPS
12.5000 mg | ORAL_CAPSULE | Freq: Every day | ORAL | Status: DC
  Filled 2019-03-26: qty 1

## 2019-03-26 MED ORDER — ALPRAZOLAM 0.5 MG PO TABS
0.5000 mg | ORAL_TABLET | Freq: Every day | ORAL | Status: DC
  Administered 2019-03-26: 0.5 mg via ORAL
  Filled 2019-03-26: qty 1

## 2019-03-26 MED ORDER — MIDAZOLAM HCL 2 MG/2ML IJ SOLN
1.0000 mg | Freq: Once | INTRAMUSCULAR | Status: AC
  Administered 2019-03-26: 2 mg via INTRAVENOUS
  Filled 2019-03-26: qty 2

## 2019-03-26 MED ORDER — SUCCINYLCHOLINE CHLORIDE 200 MG/10ML IV SOSY
PREFILLED_SYRINGE | INTRAVENOUS | Status: AC
  Filled 2019-03-26: qty 10

## 2019-03-26 MED ORDER — GABAPENTIN 300 MG PO CAPS
600.0000 mg | ORAL_CAPSULE | Freq: Three times a day (TID) | ORAL | Status: DC
  Administered 2019-03-26 – 2019-03-27 (×3): 600 mg via ORAL
  Filled 2019-03-26 (×4): qty 2

## 2019-03-26 MED ORDER — CHLORHEXIDINE GLUCONATE 4 % EX LIQD: 60.0000 mL | Freq: Once | CUTANEOUS | Status: DC

## 2019-03-26 MED ORDER — OXYCODONE HCL 5 MG PO TABS: 5.0000 mg | ORAL_TABLET | ORAL | Status: DC | PRN

## 2019-03-26 MED ORDER — ALBUTEROL SULFATE HFA 108 (90 BASE) MCG/ACT IN AERS
INHALATION_SPRAY | RESPIRATORY_TRACT | Status: AC
  Filled 2019-03-26: qty 6.7

## 2019-03-26 MED ORDER — SUCCINYLCHOLINE CHLORIDE 200 MG/10ML IV SOSY
PREFILLED_SYRINGE | INTRAVENOUS | Status: DC | PRN
  Administered 2019-03-26: 130 mg via INTRAVENOUS

## 2019-03-26 MED ORDER — LACTATED RINGERS IV SOLN
INTRAVENOUS | Status: DC
  Administered 2019-03-26: 08:00:00 via INTRAVENOUS

## 2019-03-26 MED ORDER — LIDOCAINE 2% (20 MG/ML) 5 ML SYRINGE
INTRAMUSCULAR | Status: AC
  Filled 2019-03-26: qty 5

## 2019-03-26 MED ORDER — ALBUTEROL SULFATE HFA 108 (90 BASE) MCG/ACT IN AERS
INHALATION_SPRAY | RESPIRATORY_TRACT | Status: DC | PRN
  Administered 2019-03-26: 4 via RESPIRATORY_TRACT

## 2019-03-26 MED ORDER — SODIUM CHLORIDE 0.9 % IR SOLN
Status: DC | PRN
  Administered 2019-03-26: 1000 mL

## 2019-03-26 MED ORDER — ROPINIROLE HCL 0.5 MG PO TABS
0.5000 mg | ORAL_TABLET | Freq: Every day | ORAL | Status: DC
  Administered 2019-03-26: 0.5 mg via ORAL
  Filled 2019-03-26: qty 1

## 2019-03-26 MED ORDER — TRANEXAMIC ACID-NACL 1000-0.7 MG/100ML-% IV SOLN
1000.0000 mg | INTRAVENOUS | Status: AC
  Administered 2019-03-26: 1000 mg via INTRAVENOUS
  Filled 2019-03-26: qty 100

## 2019-03-26 MED ORDER — LACTATED RINGERS IV SOLN
INTRAVENOUS | Status: DC
  Administered 2019-03-26 – 2019-03-27 (×2): via INTRAVENOUS

## 2019-03-26 MED ORDER — LISINOPRIL 20 MG PO TABS
20.0000 mg | ORAL_TABLET | Freq: Every day | ORAL | Status: DC
  Administered 2019-03-26: 22:00:00 20 mg via ORAL
  Filled 2019-03-26: qty 1

## 2019-03-26 MED ORDER — SODIUM CHLORIDE 0.9 % IV SOLN
INTRAVENOUS | Status: DC | PRN
  Administered 2019-03-26: 40 ug/min via INTRAVENOUS

## 2019-03-26 MED ORDER — BUPIVACAINE LIPOSOME 1.3 % IJ SUSP
INTRAMUSCULAR | Status: DC | PRN
  Administered 2019-03-26: 10 mL via PERINEURAL

## 2019-03-26 MED ORDER — METHOCARBAMOL 500 MG IVPB - SIMPLE MED
500.0000 mg | Freq: Four times a day (QID) | INTRAVENOUS | Status: DC | PRN
  Filled 2019-03-26: qty 50

## 2019-03-26 MED ORDER — DIPHENHYDRAMINE HCL 12.5 MG/5ML PO ELIX: 12.5000 mg | ORAL_SOLUTION | ORAL | Status: DC | PRN

## 2019-03-26 MED ORDER — ONDANSETRON HCL 4 MG/2ML IJ SOLN: 4.0000 mg | Freq: Four times a day (QID) | INTRAMUSCULAR | Status: DC | PRN

## 2019-03-26 MED ORDER — METOCLOPRAMIDE HCL 5 MG/ML IJ SOLN: 5.0000 mg | Freq: Three times a day (TID) | INTRAMUSCULAR | Status: DC | PRN

## 2019-03-26 MED ORDER — SUGAMMADEX SODIUM 200 MG/2ML IV SOLN
INTRAVENOUS | Status: DC | PRN
  Administered 2019-03-26: 200 mg via INTRAVENOUS

## 2019-03-26 MED ORDER — LISINOPRIL-HYDROCHLOROTHIAZIDE 20-12.5 MG PO TABS: 1.0000 | ORAL_TABLET | Freq: Every day | ORAL | Status: DC

## 2019-03-26 MED ORDER — ROCURONIUM BROMIDE 50 MG/5ML IV SOSY
PREFILLED_SYRINGE | INTRAVENOUS | Status: DC | PRN
  Administered 2019-03-26: 60 mg via INTRAVENOUS

## 2019-03-26 MED ORDER — ONDANSETRON HCL 4 MG PO TABS: 4.0000 mg | ORAL_TABLET | Freq: Four times a day (QID) | ORAL | Status: DC | PRN

## 2019-03-26 MED ORDER — HYDROMORPHONE HCL 1 MG/ML IJ SOLN: 0.5000 mg | INTRAMUSCULAR | Status: DC | PRN

## 2019-03-26 MED ORDER — PROPOFOL 10 MG/ML IV BOLUS
INTRAVENOUS | Status: DC | PRN
  Administered 2019-03-26: 190 mg via INTRAVENOUS

## 2019-03-26 MED ORDER — ONDANSETRON HCL 4 MG/2ML IJ SOLN
INTRAMUSCULAR | Status: DC | PRN
  Administered 2019-03-26: 4 mg via INTRAVENOUS

## 2019-03-26 MED ORDER — POLYETHYLENE GLYCOL 3350 17 G PO PACK: 17.0000 g | PACK | Freq: Every day | ORAL | Status: DC | PRN

## 2019-03-26 MED ORDER — PROPOFOL 10 MG/ML IV BOLUS
INTRAVENOUS | Status: AC
  Filled 2019-03-26: qty 20

## 2019-03-26 MED ORDER — KETOROLAC TROMETHAMINE 15 MG/ML IJ SOLN
7.5000 mg | Freq: Four times a day (QID) | INTRAMUSCULAR | Status: AC
  Administered 2019-03-26 – 2019-03-27 (×4): 7.5 mg via INTRAVENOUS
  Filled 2019-03-26 (×4): qty 1

## 2019-03-26 MED ORDER — FENTANYL CITRATE (PF) 100 MCG/2ML IJ SOLN
INTRAMUSCULAR | Status: DC | PRN
  Administered 2019-03-26 (×2): 50 ug via INTRAVENOUS

## 2019-03-26 MED ORDER — FENTANYL CITRATE (PF) 100 MCG/2ML IJ SOLN
INTRAMUSCULAR | Status: AC
  Filled 2019-03-26: qty 2

## 2019-03-26 MED ORDER — BISACODYL 5 MG PO TBEC: 5.0000 mg | DELAYED_RELEASE_TABLET | Freq: Every day | ORAL | Status: DC | PRN

## 2019-03-26 MED ORDER — ROCURONIUM BROMIDE 10 MG/ML (PF) SYRINGE
PREFILLED_SYRINGE | INTRAVENOUS | Status: AC
  Filled 2019-03-26: qty 10

## 2019-03-26 MED ORDER — METHOCARBAMOL 500 MG PO TABS: 500.0000 mg | ORAL_TABLET | Freq: Four times a day (QID) | ORAL | Status: DC | PRN

## 2019-03-26 MED ORDER — ACETAMINOPHEN 500 MG PO TABS
1000.0000 mg | ORAL_TABLET | Freq: Once | ORAL | Status: DC
  Filled 2019-03-26: qty 2

## 2019-03-26 MED ORDER — ONDANSETRON HCL 4 MG/2ML IJ SOLN
INTRAMUSCULAR | Status: AC
  Filled 2019-03-26: qty 2

## 2019-03-26 MED ORDER — PHENYLEPHRINE HCL (PRESSORS) 10 MG/ML IV SOLN
INTRAVENOUS | Status: AC
  Filled 2019-03-26: qty 1

## 2019-03-26 MED ORDER — DEXAMETHASONE SODIUM PHOSPHATE 10 MG/ML IJ SOLN
INTRAMUSCULAR | Status: DC | PRN
  Administered 2019-03-26: 4 mg via INTRAVENOUS

## 2019-03-26 MED ORDER — PHENOL 1.4 % MT LIQD
1.0000 | OROMUCOSAL | Status: DC | PRN
  Filled 2019-03-26: qty 177

## 2019-03-26 MED ORDER — DOCUSATE SODIUM 100 MG PO CAPS
100.0000 mg | ORAL_CAPSULE | Freq: Two times a day (BID) | ORAL | Status: DC
  Administered 2019-03-26 – 2019-03-27 (×2): 100 mg via ORAL
  Filled 2019-03-26 (×2): qty 1

## 2019-03-26 MED ORDER — MAGNESIUM CITRATE PO SOLN: 1.0000 | Freq: Once | ORAL | Status: DC | PRN

## 2019-03-26 MED ORDER — ALBUTEROL SULFATE (2.5 MG/3ML) 0.083% IN NEBU: 2.5000 mg | INHALATION_SOLUTION | Freq: Four times a day (QID) | RESPIRATORY_TRACT | Status: DC | PRN

## 2019-03-26 MED ORDER — HYDROCHLOROTHIAZIDE 12.5 MG PO CAPS
12.5000 mg | ORAL_CAPSULE | Freq: Every day | ORAL | Status: DC
  Administered 2019-03-26: 12.5 mg via ORAL
  Filled 2019-03-26: qty 1

## 2019-03-26 MED ORDER — ALUM & MAG HYDROXIDE-SIMETH 200-200-20 MG/5ML PO SUSP: 30.0000 mL | ORAL | Status: DC | PRN

## 2019-03-26 SURGICAL SUPPLY — 62 items
BAG ZIPLOCK 12X15 (MISCELLANEOUS) ×2 IMPLANT
BLADE SAW SGTL 83.5X18.5 (BLADE) ×2 IMPLANT
COOLER ICEMAN CLASSIC (MISCELLANEOUS) IMPLANT
COVER SURGICAL LIGHT HANDLE (MISCELLANEOUS) ×2 IMPLANT
COVER WAND RF STERILE (DRAPES) IMPLANT
CUP SUT UNIV REVERS 39 NEU (Shoulder) ×1 IMPLANT
DERMABOND ADVANCED (GAUZE/BANDAGES/DRESSINGS) ×1
DERMABOND ADVANCED .7 DNX12 (GAUZE/BANDAGES/DRESSINGS) ×1 IMPLANT
DRAPE INCISE IOBAN 66X45 STRL (DRAPES) IMPLANT
DRAPE ORTHO SPLIT 77X108 STRL (DRAPES) ×2
DRAPE SURG 17X11 SM STRL (DRAPES) ×2 IMPLANT
DRAPE SURG ORHT 6 SPLT 77X108 (DRAPES) ×2 IMPLANT
DRAPE U-SHAPE 47X51 STRL (DRAPES) ×2 IMPLANT
DRESSING AQUACEL AG SP 3.5X10 (GAUZE/BANDAGES/DRESSINGS) IMPLANT
DRSG AQUACEL AG ADV 3.5X10 (GAUZE/BANDAGES/DRESSINGS) ×2 IMPLANT
DRSG AQUACEL AG SP 3.5X10 (GAUZE/BANDAGES/DRESSINGS) ×2
DURAPREP 26ML APPLICATOR (WOUND CARE) ×2 IMPLANT
ELECT BLADE TIP CTD 4 INCH (ELECTRODE) ×2 IMPLANT
ELECT REM PT RETURN 15FT ADLT (MISCELLANEOUS) ×2 IMPLANT
FACESHIELD WRAPAROUND (MASK) ×8 IMPLANT
FACESHIELD WRAPAROUND OR TEAM (MASK) ×4 IMPLANT
GLENOID UNI REV MOD 24 +2 LAT (Joint) ×1 IMPLANT
GLENOSPHERE 39+4 LAT/24 UNI RV (Joint) ×1 IMPLANT
GLOVE BIO SURGEON STRL SZ7.5 (GLOVE) ×2 IMPLANT
GLOVE BIO SURGEON STRL SZ8 (GLOVE) ×2 IMPLANT
GLOVE SS BIOGEL STRL SZ 7 (GLOVE) ×1 IMPLANT
GLOVE SS BIOGEL STRL SZ 7.5 (GLOVE) ×1 IMPLANT
GLOVE SUPERSENSE BIOGEL SZ 7 (GLOVE) ×1
GLOVE SUPERSENSE BIOGEL SZ 7.5 (GLOVE) ×1
GOWN STRL REUS W/TWL LRG LVL3 (GOWN DISPOSABLE) ×4 IMPLANT
INSERT HUMERAL 39/+6 (Insert) ×1 IMPLANT
KIT BASIN OR (CUSTOM PROCEDURE TRAY) ×2 IMPLANT
KIT TURNOVER KIT A (KITS) IMPLANT
MANIFOLD NEPTUNE II (INSTRUMENTS) ×2 IMPLANT
NDL TAPERED W/ NITINOL LOOP (MISCELLANEOUS) ×1 IMPLANT
NEEDLE TAPERED W/ NITINOL LOOP (MISCELLANEOUS) ×2 IMPLANT
NS IRRIG 1000ML POUR BTL (IV SOLUTION) ×2 IMPLANT
PACK SHOULDER (CUSTOM PROCEDURE TRAY) ×2 IMPLANT
PAD ARMBOARD 7.5X6 YLW CONV (MISCELLANEOUS) ×2 IMPLANT
PAD COLD SHLDR WRAP-ON (PAD) ×1 IMPLANT
PIN SET MODULAR GLENOID SYSTEM (PIN) ×1 IMPLANT
PROTECTOR NERVE ULNAR (MISCELLANEOUS) ×2 IMPLANT
RESTRAINT HEAD UNIVERSAL NS (MISCELLANEOUS) ×2 IMPLANT
SCREW CENTRAL MOD 30MM (Screw) ×1 IMPLANT
SCREW PERI LOCK 5.5X16 (Screw) ×1 IMPLANT
SCREW PERI LOCK 5.5X32 (Screw) ×1 IMPLANT
SCREW PERIPHERAL 5.5X20 LOCK (Screw) ×2 IMPLANT
SLING ARM FOAM STRAP LRG (SOFTGOODS) ×2 IMPLANT
SLING ARM FOAM STRAP MED (SOFTGOODS) IMPLANT
SPONGE LAP 18X18 RF (DISPOSABLE) IMPLANT
STEM HUMERAL UNI REVERS SZ9 (Stem) ×1 IMPLANT
SUCTION FRAZIER HANDLE 12FR (TUBING) ×1
SUCTION TUBE FRAZIER 12FR DISP (TUBING) ×1 IMPLANT
SUT MNCRL AB 3-0 PS2 18 (SUTURE) ×2 IMPLANT
SUT MON AB 2-0 CT1 36 (SUTURE) ×2 IMPLANT
SUT VIC AB 1 CT1 36 (SUTURE) ×2 IMPLANT
SUTURE TAPE 1.3 40 TPR END (SUTURE) ×2 IMPLANT
SUTURETAPE 1.3 40 TPR END (SUTURE) ×4
SYR BULB IRRIGATION 50ML (SYRINGE) ×1 IMPLANT
TOWEL OR 17X26 10 PK STRL BLUE (TOWEL DISPOSABLE) ×2 IMPLANT
TOWEL OR NON WOVEN STRL DISP B (DISPOSABLE) ×2 IMPLANT
WATER STERILE IRR 1000ML POUR (IV SOLUTION) ×4 IMPLANT

## 2019-03-26 NOTE — H&P (Signed)
Justin Trujillo    Chief Complaint: left shoulder rotator cuff tear arthropathy HPI: The patient is a 73 y.o. male with chronic and progressively increasing left shoulder pain and functional limitations related to end stage rotator cuff tear arthropathy  Past Medical History:  Diagnosis Date  . Arthritis   . Coronary atherosclerosis of native coronary artery   . Hypogonadism male   . Obstructive chronic bronchitis with exacerbation ([place])   . Obstructive sleep apnea (adult) (pediatric)    no cpap  . Other and unspecified hyperlipidemia   . Other emphysema ([place])   . Proteinuria 2019   has seen a nephrologist  . Shortness of breath    with excertion  . Type II or unspecified type diabetes mellitus without mention of complication, not stated as uncontrolled    type 2  . Unspecified essential hypertension     Past Surgical History:  Procedure Laterality Date  . CARDIAC CATHETERIZATION    . IR KYPHO LUMBAR INC FX REDUCE BONE BX UNI/BIL CANNULATION INC/IMAGING  06/13/2018  . L lens implant    . R knee replacement x2    . TONSILLECTOMY    . TONSILLECTOMY AND ADENOIDECTOMY    . vascectomy      Family History  Problem Relation Age of Onset  . Emphysema Father   . Heart disease Father   . Clotting disorder Mother     Social History:  reports that he has been smoking cigarettes. He has been smoking about 1.00 pack per day. He has never used smokeless tobacco. He reports previous alcohol use of about 1.0 standard drinks of alcohol per week. He reports that he does not use drugs.   Medications Prior to Admission  Medication Sig Dispense Refill  . acetaminophen (TYLENOL) 500 MG tablet Take 1,000 mg by mouth every 8 (eight) hours as needed (pain).    Marland Kitchen ALPRAZolam (XANAX) 0.5 MG tablet Take 0.5 mg by mouth at bedtime.    Marland Kitchen aspirin EC 81 MG tablet Take 81 mg by mouth daily.    Marland Kitchen atorvastatin (LIPITOR) 40 MG tablet Take 1 tablet (40 mg total) by mouth daily. 30 tablet 0  . Calcium  Carb-Cholecalciferol (CALCIUM 600+D3 PO) Take 1 tablet by mouth daily.    . celecoxib (CELEBREX) 200 MG capsule Take 200 mg by mouth daily.    . Cholecalciferol (VITAMIN D) 50 MCG (2000 UT) tablet Take 2,000 Units by mouth daily.    Marland Kitchen gabapentin (NEURONTIN) 300 MG capsule Take 2 capsules (600 mg total) by mouth 3 (three) times daily. 90 capsule 5  . lisinopril-hydrochlorothiazide (ZESTORETIC) 20-12.5 MG tablet Take 1 tablet by mouth daily.     . metFORMIN (GLUCOPHAGE) 1000 MG tablet Take 1 tablet (1,000 mg total) by mouth 2 (two) times daily with a meal. 60 tablet 0  . oxyCODONE-acetaminophen (PERCOCET) 10-325 MG tablet Take 1 tablet by mouth every 4 (four) hours as needed for pain. 60 tablet 0  . rOPINIRole (REQUIP) 0.5 MG tablet Take 0.5 mg by mouth at bedtime.     Marland Kitchen tiZANidine (ZANAFLEX) 4 MG tablet Take 4 mg by mouth 3 (three) times daily as needed for muscle spasms.   0  . insulin NPH-regular Human (NOVOLIN 70/30) (70-30) 100 UNIT/ML injection Inject 10-15 Units into the skin daily as needed (ONLY WHEN FASTING BLOOD SUGAR 150 OR MORE).     Marland Kitchen linaclotide (LINZESS) 145 MCG CAPS capsule Take 145-290 mcg by mouth daily as needed (constipation).     Marland Kitchen  oxyCODONE 30 MG 12 hr tablet Take 1 tablet (30 mg total) by mouth every 8 (eight) hours. (Patient not taking: Reported on 03/16/2019) 90 tablet 0  . testosterone cypionate (DEPOTESTOSTERONE CYPIONATE) 200 MG/ML injection Inject 200 mg into the muscle every 28 (twenty-eight) days.     Marland Kitchen trolamine salicylate (ASPERCREME) 10 % cream Apply 1 application topically 2 (two) times daily as needed for muscle pain.        Physical Exam: Left shoulder demonstrates painful and guarded motion with restrictions in strength and mobility as noted at previous office visits  Vitals  Temp:  [97.6 F (36.4 C)] 97.6 F (36.4 C) (07/09 0803) Pulse Rate:  [89-94] 92 (07/09 0921) Resp:  [13-19] 17 (07/09 0921) BP: (148-160)/(89-104) 154/96 (07/09 0919) SpO2:  [97  %-99 %] 98 % (07/09 0921)  Assessment/Plan  Impression: left shoulder rotator cuff tear arthropathy  Plan of Action: Procedure(s): REVERSE SHOULDER ARTHROPLASTY  Justin Trujillo M Baili Stang 03/26/2019, 9:47 AM Contact # [phone]

## 2019-03-26 NOTE — Anesthesia Postprocedure Evaluation (Signed)
Anesthesia Post Note  Patient: Justin Trujillo  Procedure(s) Performed: REVERSE SHOULDER ARTHROPLASTY (Left )     Patient location during evaluation: PACU Anesthesia Type: Regional and General Level of consciousness: awake and alert Pain management: pain level controlled Vital Signs Assessment: post-procedure vital signs reviewed and stable Respiratory status: spontaneous breathing, nonlabored ventilation, respiratory function stable and patient connected to nasal cannula oxygen Cardiovascular status: blood pressure returned to baseline and stable Postop Assessment: no apparent nausea or vomiting Anesthetic complications: no    Last Vitals:  Vitals:   03/26/19 1443 03/26/19 1609  BP: (!) 144/89 (!) 158/93  Pulse: 92 100  Resp: 16 16  Temp: 36.6 C 36.5 C  SpO2: 99% 95%    Last Pain:  Vitals:   03/26/19 1609  TempSrc: Oral  PainSc:                  Chessa Barrasso P Salam Micucci

## 2019-03-26 NOTE — Anesthesia Procedure Notes (Signed)
Anesthesia Regional Block: Interscalene brachial plexus block   Pre-Anesthetic Checklist: ,, timeout performed, Correct Patient, Correct Site, Correct Laterality, Correct Procedure, Correct Position, site marked, Risks and benefits discussed,  Surgical consent,  Pre-op evaluation,  At surgeon's request and post-op pain management  Laterality: Left  Prep: chloraprep       Needles:  Injection technique: Single-shot  Needle Type: Echogenic Stimulator Needle     Needle Length: 9cm  Needle Gauge: 21     Additional Needles:   Procedures:,,,, ultrasound used (permanent image in chart),,,,  Narrative:  Start time: 03/26/2019 9:05 AM End time: 03/26/2019 9:15 AM Injection made incrementally with aspirations every 5 mL.  Performed by: Personally  Anesthesiologist: Murvin Natal, MD  Additional Notes: Functioning IV was confirmed and monitors were applied.  A timeout was performed. Sterile prep, hand hygiene and sterile gloves were used. A 44mm 21ga Arrow echogenic stimulator needle was used. Negative aspiration and negative test dose prior to incremental administration of local anesthetic. The patient tolerated the procedure well.  Ultrasound guidance: relevent anatomy identified, needle position confirmed, local anesthetic spread visualized around nerve(s), vascular puncture avoided.  Image printed for medical record.

## 2019-03-26 NOTE — Progress Notes (Signed)
AssistedDr. Ellender with left, ultrasound guided, interscalene  block. Side rails up, monitors on throughout procedure. See vital signs in flow sheet. Tolerated Procedure well.  

## 2019-03-26 NOTE — Discharge Instructions (Signed)
° °Kevin M. Supple, M.D., F.A.A.O.S. °Orthopaedic Surgery °Specializing in Arthroscopic and Reconstructive °Surgery of the Shoulder °[phone] °[address] - [place], [place] [address] - Fax [phone] ° ° °POST-OP TOTAL SHOULDER REPLACEMENT INSTRUCTIONS ° °1. Call the office at [phone] to schedule your first post-op appointment 10-14 days from the date of your surgery. ° °2. The bandage over your incision is waterproof. You may begin showering with this dressing on. You may leave this dressing on until first follow up appointment within 2 weeks. We prefer you leave this dressing in place until follow up however after 5-7 days if you are having itching or skin irritation and would like to remove it you may do so. Go slow and tug at the borders gently to break the bond the dressing has with the skin. At this point if there is no drainage it is okay to go without a bandage or you may cover it with a light guaze and tape. You can also expect significant bruising around your shoulder that will drift down your arm and into your chest wall. This is very normal and should resolve over several days. ° ° 3. Wear your sling/immobilizer at all times except to perform the exercises below or to occasionally let your arm dangle by your side to stretch your elbow. You also need to sleep in your sling immobilizer until instructed otherwise. It is ok to remove your sling if you are sitting in a controlled environment and allow your arm to rest in a position of comfort by your side or on your lap with pillows to give your neck and skin a break from the sling. You may remove it to allow arm to dangle by side to shower. If you are up walking around and when you go to sleep at night you need to wear it. ° °4. Range of motion to your elbow, wrist, and hand are encouraged 3-5 times daily. Exercise to your hand and fingers helps to reduce swelling you may experience. ° °5. Utilize ice to the shoulder 3-5 times  minimum a day and additionally if you are experiencing pain. ° °6. Prescriptions for a pain medication and a muscle relaxant are provided for you. It is recommended that if you are experiencing pain that you pain medication alone is not controlling, add the muscle relaxant along with the pain medication which can give additional pain relief. The first 1-2 days is generally the most severe of your pain and then should gradually decrease. As your pain lessens it is recommended that you decrease your use of the pain medications to an "as needed basis'" only and to always comply with the recommended dosages of the pain medications. ° °7. Pain medications can produce constipation along with their use. If you experience this, the use of an over the counter stool softener or laxative daily is recommended.  ° °8. For additional questions or concerns, please do not hesitate to call the office. If after hours there is an answering service to forward your concerns to the physician on call. ° °9.Pain control following an exparel block ° °To help control your post-operative pain you received a nerve block  performed with Exparel which is a long acting anesthetic (numbing agent) which can provide pain relief and sensations of numbness (and relief of pain) in the operative shoulder and arm for up to 3 days. Sometimes it provides mixed relief, meaning you may still have numbness in certain areas of the arm but can still   be able to move  parts of that arm, hand, and fingers. We recommend that your prescribed pain medications  be used as needed. We do not feel it is necessary to "pre medicate" and "stay ahead" of pain.  Taking narcotic pain medications when you are not having any pain can lead to unnecessary and potentially dangerous side effects.    10. Use the ice machine as much as possible in the first 5-7 days from surgery, then you can wean its use to as needed. The ice typically needs to be replaced every 6 hours, instead of  ice you can actually freeze water bottles to put in the cooler and then fill water around them to avoid having to purchase ice. You can have spare water bottles freezing to allow you to rotate them once they have melted. Try to have a thin shirt or light cloth or towel under the ice wrap to protect your skin.   POST-OP EXERCISES  Pendulum Exercises  Perform pendulum exercises while standing and bending at the waist. Support your uninvolved arm on a table or chair and allow your operated arm to hang freely. Make sure to do these exercises passively - not using you shoulder muscles.  Repeat 20 times. Do 3 sessions per day.

## 2019-03-26 NOTE — Transfer of Care (Signed)
Immediate Anesthesia Transfer of Care Note  Patient: Justin Trujillo  Procedure(s) Performed: REVERSE SHOULDER ARTHROPLASTY (Left )  Patient Location: PACU  Anesthesia Type:GA combined with regional for post-op pain  Level of Consciousness: drowsy and patient cooperative  Airway & Oxygen Therapy: Patient Spontanous Breathing and Patient connected to face mask oxygen  Post-op Assessment: Report given to RN and Post -op Vital signs reviewed and stable  Post vital signs: Reviewed and stable  Last Vitals:  Vitals Value Taken Time  BP    Temp    Pulse    Resp    SpO2      Last Pain:  Vitals:   03/26/19 0803  TempSrc: Oral         Complications: No apparent anesthesia complications

## 2019-03-26 NOTE — Op Note (Signed)
03/26/2019  11:44 AM  PATIENT:   Justin Trujillo  73 y.o. male  PRE-OPERATIVE DIAGNOSIS:  left shoulder rotator cuff tear arthropathy  POST-OPERATIVE DIAGNOSIS: Same  PROCEDURE: Left reverse shoulder arthroplasty utilizing a press-fit size 9 Arthrex stem with a +6 polyethylene insert, small/+2 baseplate, 39/+4 glenosphere  SURGEON:  Champ Keetch, Metta Clines M.D.  ASSISTANTS: Jenetta Loges, PA-C  ANESTHESIA:   General endotracheal and interscalene block with Exparel  EBL: 200 cc  SPECIMEN: None  Drains: None   PATIENT DISPOSITION:  PACU - hemodynamically stable.    PLAN OF CARE: Admit for overnight observation  Brief history:  Justin Trujillo is a 72 year old gentleman whose had chronic and progressively increasing left shoulder pain and restricted mobility related to end stage rotator cuff tear arthropathy.  He is brought to the operating this time for planned left shoulder reverse arthroplasty  Preoperatively I counseled Justin Trujillo regarding treatment options as well as the potential risks versus benefits thereof.  Possible surgical complications were reviewed including bleeding, infection, neurovascular injury, persistent pain, loss of motion, anesthetic complication, and possible failure the implant.  He understands, and accepts, and agrees with our planned procedure.  Procedure in detail:  After undergoing routine preop evaluation patient received prophylactic antibiotics and an interscalene block with Exparel was established in the holding area by the anesthesia department.  Placed supine on the operating table and underwent the smooth induction of a general endotracheal anesthesia.  Placed in the beachchair position and appropriately padded and protected.  Left shoulder girdle region was sterilely prepped and draped in standard fashion.  Timeout was called.  An anterior deltopectoral approach to the left shoulder was made through a 10 cm incision.  Skin flaps were elevated dissection  carried deeply and the deltopectoral interval was then developed from proximal to distal with the vein taken laterally upper centimeter half pectoralis major tenotomized for exposure conjoined tendon mobilized retracted medially and adhesions divided beneath the deltoid.  Remnant of the long head biceps tendon was identified and this was tenodesed at the upper border the pectoralis major and the proximal segment was elevated and excised.  There is very little tissue left of the subscapularis insertion and this appeared nonfunctional so the subscap remnant was divided and allowed to retract medially and the capsular attachments were then divided from the anterior inferior margins of the humeral neck and humeral head was then delivered through the wound.  We outlined our proposed humeral head resection using the extra medullary guide and the head was then resected with an oscillating saw.  A metal cap was placed over the cut proximal humeral surface and at this point we exposed the glenoid with appropriate retractors and performed a circumferential labral resection gaining complete visualization of the periphery of the glenoid.  A guidepin was then directed into the center of the glenoid with an approximate 10 degree inferior tilt and the glenoid was then prepared using the central followed by peripheral reamer central drill and tapped and our small baseplate with a 30 mm lag screw was then inserted achieving excellent purchase and fixation.  The peripheral locking screws were all then placed again achieving excellent purchase and fixation.  The 39/+4 glenosphere was then impacted onto the baseplate and the central locking screw was transfixed.  We then returned attention back to the proximal humerus where the canal was hand reamed to size 7 and broached to size 9 and the metaphysis was then reamed with a neutral reamer.  Trial reduction was  then performed off the trial implant the showed good soft tissue balance.   The trial was then removed and the final implant was assembled on the back table and this was then impacted into the humeral canal with excellent fit fixation.  Trial reductions then showed the +6 poly-with excellent soft tissue balance and stability.  The final +6 polyethylene was then impacted into the implant and the final reduction was then performed after the joint was copiously irrigated and hemostasis was obtained.  At this point the deltopectoral interval was then reapproximated with a series of figure-of-eight #1 Vicryl sutures.  2-0 Vicryl used for subcu layer and intracuticular 3 Monocryl for the skin followed by Dermabond and Aquasol dressing.  Patient was then awakened, extubated, taken to recovery room in stable condition with the left arm in a sling.  Jenetta Loges, PA-C was used as an Environmental consultant throughout this case essential for help with positioning the patient, position extremity, tissue manipulation, implantation of the prosthesis, wound closure, and intraoperative decision-making.  Metta Clines Hadden Steig MD   Contact # [phone]

## 2019-03-26 NOTE — Anesthesia Procedure Notes (Addendum)
Procedure Name: Intubation Date/Time: 03/26/2019 10:30 AM Performed by: Montel Clock, CRNA Pre-anesthesia Checklist: Patient identified, Emergency Drugs available, Suction available, Patient being monitored and Timeout performed Patient Re-evaluated:Patient Re-evaluated prior to induction Oxygen Delivery Method: Circle system utilized Preoxygenation: Pre-oxygenation with 100% oxygen Induction Type: IV induction Ventilation: Mask ventilation without difficulty and Oral airway inserted - appropriate to patient size Laryngoscope Size: Glidescope and 4 Grade View: Grade II Tube type: Parker flex tip Tube size: 7.5 mm Number of attempts: 2 Airway Equipment and Method: Video-laryngoscopy and Rigid stylet Placement Confirmation: ETT inserted through vocal cords under direct vision,  positive ETCO2 and breath sounds checked- equal and bilateral Secured at: 23 cm Tube secured with: Tape Dental Injury: Teeth and Oropharynx as per pre-operative assessment  Comments: First attempt MAC 3 grade 3 view. Glidescope 4 ETT easily passed through cords.

## 2019-03-26 NOTE — Progress Notes (Signed)
Patient belongings sent upstairs with patient. Patient called wife on cell phone to update.

## 2019-03-27 ENCOUNTER — Encounter (HOSPITAL_COMMUNITY): Payer: Self-pay | Admitting: Orthopedic Surgery

## 2019-03-27 LAB — GLUCOSE, CAPILLARY: Glucose-Capillary: 116 mg/dL — ABNORMAL HIGH (ref 70–99)

## 2019-03-27 MED ORDER — OXYCODONE-ACETAMINOPHEN 10-325 MG PO TABS: 1.0000 | ORAL_TABLET | ORAL | 0 refills | Status: DC | PRN

## 2019-03-27 NOTE — Plan of Care (Signed)
  Problem: Education: °Goal: Knowledge of the prescribed therapeutic regimen will improve °Outcome: Adequate for Discharge °Goal: Understanding of activity limitations/precautions following surgery will improve °Outcome: Adequate for Discharge °Goal: Individualized Educational Video(s) °Outcome: Adequate for Discharge °  °Problem: Activity: °Goal: Ability to tolerate increased activity will improve °Outcome: Adequate for Discharge °  °Problem: Pain Management: °Goal: Pain level will decrease with appropriate interventions °Outcome: Adequate for Discharge °  °

## 2019-03-27 NOTE — Evaluation (Addendum)
Occupational Therapy Evaluation Patient Details Name: Justin Trujillo MRN: [id number] DOB: 02-22-1946 Today's Date: 03/27/2019    History of Present Illness Pt is a 73 y/o male now s/p L reverse TSA. PMHx includes DM, HTN, hx of R TKA, spinal sx, and hx cardiac catheterization    Clinical Impression   This 73 y/o male presents with the above. PTA pt reports independent with ADL and functional mobility within the home, reports occasional mod independent using AD when in community. Pt does report x1 fall in the past 2 weeks. He currently requires minguard assist for functional mobility within room; able to perform seated UB ADL with minguard assist and mod cues for technique/maintaining precautions, minA for toileting and LB ADL. Education provided of shoulder precautions, sling management, HEP, safety and compensatory strategies for performing ADL and functional transfers at home. Pt both verbalizing and return demonstrating during this session with intermittent cueing provided to ensure proper carryover. Pt reports he will return home with spouse and two daughters who are able to assist PRN. Questions answered throughout with no further acute OT needs identified. Recommend pt follow up with therapies as recommended per MD. Acute OT to sign off. Thank you for this referral.     Follow Up Recommendations  Follow surgeon's recommendation for DC plan and follow-up therapies;Supervision/Assistance - 24 hour    Equipment Recommendations  None recommended by OT           Precautions / Restrictions Precautions Precautions: Shoulder Type of Shoulder Precautions: sling on at atll times except for ADL/exercise; okay to come out of sling if sitting in controlled environment, sleep in sling until follow-up. NWB UE.  okay for pendulum, lapslides, and AROM to e/w/h. Okay to use operative UE for feeding, ADL; okay for P/AA/AROM within following for ADL: ER 0-20, ABD 0-45, FF 0-60.  Shoulder Interventions:  Shoulder sling/immobilizer;At all times;Off for dressing/bathing/exercises Precaution Booklet Issued: Yes (comment) Precaution Comments: issued and reviewed with pt Required Braces or Orthoses: Sling Restrictions Weight Bearing Restrictions: Yes LUE Weight Bearing: Non weight bearing      Mobility Bed Mobility               General bed mobility comments: pt seated in chair upon arrival  Transfers Overall transfer level: Needs assistance Equipment used: None Transfers: Sit to/from Stand Sit to Stand: Min guard         General transfer comment: for general safety    Balance Overall balance assessment: No apparent balance deficits (not formally assessed)                                         ADL either performed or assessed with clinical judgement   ADL Overall ADL's : Needs assistance/impaired Eating/Feeding: Modified independent;Sitting   Grooming: Set up;Sitting   Upper Body Bathing: Min guard;Sitting   Lower Body Bathing: Min guard;Sit to/from stand   Upper Body Dressing : Min guard;Sitting Upper Body Dressing Details (indicate cue type and reason): pt was able to don overhead shirt, sling with mod cues for technique, no physical assist required Lower Body Dressing: Minimal assistance;Sit to/from stand Lower Body Dressing Details (indicate cue type and reason): assist for advancing underwear/pants over L hip Toilet Transfer: Min guard;Ambulation;Regular Toilet   Toileting- Clothing Manipulation and Hygiene: Minimal assistance;Sit to/from stand Toileting - Clothing Manipulation Details (indicate cue type and reason): assist for L side of  pants when advancing over hips     Functional mobility during ADLs: Min guard General ADL Comments: reviewed shoulder precuations, safety and compensatory techniques for completing ADL and functional transfers                         Pertinent Vitals/Pain Pain Assessment: No/denies pain(still  feeling nerve block)     Hand Dominance Left   Extremity/Trunk Assessment Upper Extremity Assessment Upper Extremity Assessment: LUE deficits/detail LUE Deficits / Details: s/p reverse L TSA LUE: Unable to fully assess due to immobilization LUE Sensation: WNL LUE Coordination: decreased gross motor   Lower Extremity Assessment Lower Extremity Assessment: Overall WFL for tasks assessed       Communication Communication Communication: No difficulties   Cognition Arousal/Alertness: Awake/alert Behavior During Therapy: WFL for tasks assessed/performed Overall Cognitive Status: Within Functional Limits for tasks assessed                                     General Comments       Exercises Exercises: General Upper Extremity;Shoulder;Hand exercises;Other exercises General Exercises - Upper Extremity Elbow Flexion: AAROM;Left;10 reps;Seated Elbow Extension: AAROM;Left;10 reps;Seated Wrist Flexion: AROM;Left;10 reps;Seated Wrist Extension: AROM;Left;10 reps;Seated Digit Composite Flexion: AROM;Left;10 reps;Seated Composite Extension: AROM;Left;10 reps;Seated Shoulder Exercises Pendulum Exercise: PROM;Left;5 reps;Standing Neck Flexion: AROM;Seated Neck Extension: AROM;Seated Neck Lateral Flexion - Right: AROM;Seated Neck Lateral Flexion - Left: AROM;Seated Hand Exercises Forearm Supination: AROM;Left;5 reps;Seated Forearm Pronation: AROM;Left;5 reps;Seated Other Exercises Other Exercises: gentle lap slides, LUE, x10 seated   Shoulder Instructions Shoulder Instructions Donning/doffing shirt without moving shoulder: Min-guard Method for sponge bathing under operated UE: Min-guard Donning/doffing sling/immobilizer: Min-guard Correct positioning of sling/immobilizer: Modified independent Pendulum exercises (written home exercise program): Min-guard ROM for elbow, wrist and digits of operated UE: Modified independent Sling wearing schedule (on at all times/off  for ADL's): Modified independent Proper positioning of operated UE when showering: Supervision/safety Positioning of UE while sleeping: Min-guard    Home Living Family/patient expects to be discharged to:: Private residence Living Arrangements: Spouse/significant other Available Help at Discharge: Family;Available 24 hours/day Type of Home: House Home Access: Stairs to enter [employer] of Steps: 5 Entrance Stairs-Rails: Left Home Layout: Two level;Able to live on main level with bedroom/bathroom   Alternate Level Stairs-Rails: Left Bathroom Shower/Tub: Occupational psychologist: Handicapped height Bathroom Accessibility: Yes   Home Equipment: Shower seat - built in;Shower seat;Bedside commode;Walker - 4 wheels;Walker - standard;Crutches;Cane - single point;Grab bars - tub/shower;Hand held shower head;Adaptive equipment Adaptive Equipment: Sock aid;Long-handled shoe horn        Prior Functioning/Environment Level of Independence: Independent                 OT Problem List: Decreased strength;Decreased range of motion;Decreased activity tolerance;Decreased knowledge of use of DME or AE;Decreased knowledge of precautions;Impaired UE functional use      OT Treatment/Interventions:      OT Goals(Current goals can be found in the care plan section) Acute Rehab OT Goals Patient Stated Goal: home today OT Goal Formulation: All assessment and education complete, DC therapy Potential to Achieve Goals: Good  OT Frequency:     Barriers to D/C:            Co-evaluation              AM-PAC OT "6 Clicks" Daily Activity     Outcome Measure  Help from another person eating meals?: None Help from another person taking care of personal grooming?: A Little Help from another person toileting, which includes using toliet, bedpan, or urinal?: A Little Help from another person bathing (including washing, rinsing, drying)?: A Little Help from another person  to put on and taking off regular upper body clothing?: A Little Help from another person to put on and taking off regular lower body clothing?: A Little 6 Click Score: 19   End of Session Equipment Utilized During Treatment: Other (comment)(sling) Nurse Communication: Mobility status  Activity Tolerance: Patient tolerated treatment well Patient left: in chair;with call bell/phone within reach  OT Visit Diagnosis: Muscle weakness (generalized) (M62.81);Other abnormalities of gait and mobility (R26.89)                Time: 3094-0768 OT Time Calculation (min): 39 min Charges:  OT General Charges $OT Visit: 1 Visit OT Evaluation $OT Eval Moderate Complexity: 1 Mod OT Treatments $Self Care/Home Management : 23-37 mins  Lou Cal, OT [facility] Pager [phone] Office [place] 03/27/2019, 10:09 AM

## 2019-03-27 NOTE — Discharge Summary (Signed)
PATIENT ID:      Justin Trujillo  MRN:     [id number] DOB/AGE:    [birth year] / 73 y.o.     DISCHARGE SUMMARY  ADMISSION DATE:    03/26/2019 DISCHARGE DATE:    ADMISSION DIAGNOSIS: left shoulder rotator cuff tear arthropathy Past Medical History:  Diagnosis Date  . Arthritis   . Coronary atherosclerosis of native coronary artery   . Hypogonadism male   . Obstructive chronic bronchitis with exacerbation ([place])   . Obstructive sleep apnea (adult) (pediatric)    no cpap  . Other and unspecified hyperlipidemia   . Other emphysema ([place])   . Proteinuria 2019   has seen a nephrologist  . Shortness of breath    with excertion  . Type II or unspecified type diabetes mellitus without mention of complication, not stated as uncontrolled    type 2  . Unspecified essential hypertension     DISCHARGE DIAGNOSIS:   Active Problems:   S/P reverse total shoulder arthroplasty, left   PROCEDURE: Procedure(s): REVERSE SHOULDER ARTHROPLASTY on 03/26/2019  CONSULTS:    HISTORY:  See H&P in chart.  HOSPITAL COURSE:  GILAD DUGGER is a 73 y.o. admitted on 03/26/2019 with a diagnosis of left shoulder rotator cuff tear arthropathy.  They were brought to the operating room on 03/26/2019 and underwent Procedure(s): [place].    They were given perioperative antibiotics:  Anti-infectives (From admission, onward)   Start     Dose/Rate Route Frequency Ordered Stop   03/26/19 0745  clindamycin (CLEOCIN) IVPB 900 mg     900 mg 100 mL/hr over 30 Minutes Intravenous On call to O.R. 03/26/19 0741 03/26/19 1045    .  Patient underwent the above named procedure and tolerated it well. The following day they were hemodynamically stable and pain was controlled on oral analgesics. They were neurovascularly intact to the operative extremity. OT was ordered and worked with patient per protocol. They were medically and orthopaedically stable for discharge on .    DIAGNOSTIC STUDIES:  RECENT  RADIOGRAPHIC STUDIES :  No results found.  RECENT VITAL SIGNS:   Patient Vitals for the past 24 hrs:  BP Temp Temp src Pulse Resp SpO2 Height Weight  03/27/19 0428 (!) 168/94 97.7 F (36.5 C) Oral 94 20 99 % - -  03/27/19 0033 (!) 155/94 (!) 97.5 F (36.4 C) Oral 92 14 98 % - -  03/26/19 2136 (!) 152/111 98 F (36.7 C) Oral 95 16 97 % - -  03/26/19 1649 (!) 153/84 98.4 F (36.9 C) Oral (!) 102 16 97 % - -  03/26/19 1609 (!) 158/93 97.7 F (36.5 C) Oral 100 16 95 % - -  03/26/19 1443 (!) 144/89 97.8 F (36.6 C) Oral 92 16 99 % - -  03/26/19 1350 - - - - - - 5\' 6"  (1.676 m) 104.3 kg  03/26/19 1348 (!) 130/99 98.1 F (36.7 C) - 93 18 96 % - -  03/26/19 1330 122/72 98.4 F (36.9 C) - 93 17 97 % - -  03/26/19 1230 138/69 97.7 F (36.5 C) - 96 15 96 % - -  03/26/19 1215 122/84 - - 94 16 100 % - -  03/26/19 1204 138/83 (!) 97.4 F (36.3 C) - 95 18 100 % - -  03/26/19 0949 - - - 91 16 99 % - -  03/26/19 0948 - - - 93 (!) 25 98 % - -  03/26/19 0962 - - -  91 15 98 % - -  03/26/19 0946 - - - 91 16 98 % - -  03/26/19 0945 - - - 92 16 98 % - -  03/26/19 0944 - - - 92 19 98 % - -  03/26/19 0943 - - - 94 20 98 % - -  03/26/19 0942 - - - 91 14 98 % - -  03/26/19 0941 - - - 91 15 98 % - -  03/26/19 0940 - - - 95 20 98 % - -  03/26/19 0939 (!) 126/91 - - 95 13 95 % - -  03/26/19 0938 - - - 91 14 98 % - -  03/26/19 0937 - - - 92 15 98 % - -  03/26/19 0930 - - - 92 14 98 % - -  03/26/19 0929 - - - 91 14 99 % - -  03/26/19 0928 - - - 92 (!) 27 99 % - -  03/26/19 0927 - - - 93 15 98 % - -  03/26/19 0926 - - - 94 (!) 27 99 % - -  03/26/19 0921 - - - 92 17 98 % - -  03/26/19 0920 - - - 91 19 98 % - -  03/26/19 0919 (!) 154/96 - - 93 17 97 % - -  03/26/19 0918 - - - 89 16 98 % - -  03/26/19 0917 - - - 89 14 98 % - -  03/26/19 0916 - - - 90 14 98 % - -  03/26/19 0915 - - - 90 13 98 % - -  03/26/19 0914 (!) 148/94 - - 89 17 98 % - -  03/26/19 0913 - - - 91 17 98 % - -  03/26/19 0912 - - -  89 15 98 % - -  03/26/19 0911 - - - 92 17 98 % - -  03/26/19 0910 - - - 89 15 98 % - -  03/26/19 0909 - - - 92 18 98 % - -  03/26/19 0908 (!) 150/104 - - 92 14 99 % - -  03/26/19 0907 - - - 91 16 98 % - -  .  RECENT EKG RESULTS:    Orders placed or performed during the hospital encounter of 03/23/19  . EKG 12 lead  . EKG 12 lead    DISCHARGE INSTRUCTIONS:    DISCHARGE MEDICATIONS:   Allergies as of 03/27/2019      Reactions   Iodinated Diagnostic Agents Anaphylaxis   Moxifloxacin Swelling   REACTION: GI upset, throat "tightened up"   Penicillins Anaphylaxis   Did it involve swelling of the face/tongue/throat, SOB, or low BP? Yes Did it involve sudden or severe rash/hives, skin peeling, or any reaction on the inside of your mouth or nose? No Did you need to seek medical attention at a hospital or doctor's office? Yes When did it last happen?been a while  If all above answers are "NO", may proceed with cephalosporin use.   Shellfish-derived Products Anaphylaxis   Latex Rash   Over long periods of time      Medication List    STOP taking these medications   oxyCODONE 30 MG 12 hr tablet     TAKE these medications   acetaminophen 500 MG tablet Commonly known as: TYLENOL Take 1,000 mg by mouth every 8 (eight) hours as needed (pain).   ALPRAZolam 0.5 MG tablet Commonly known as: XANAX Take 0.5 mg by mouth at bedtime.  aspirin EC 81 MG tablet Take 81 mg by mouth daily.   atorvastatin 40 MG tablet Commonly known as: LIPITOR Take 1 tablet (40 mg total) by mouth daily.   CALCIUM 600+D3 PO Take 1 tablet by mouth daily.   celecoxib 200 MG capsule Commonly known as: CELEBREX Take 200 mg by mouth daily.   gabapentin 300 MG capsule Commonly known as: NEURONTIN Take 2 capsules (600 mg total) by mouth 3 (three) times daily.   insulin NPH-regular Human (70-30) 100 UNIT/ML injection Inject 10-15 Units into the skin daily as needed (ONLY WHEN FASTING BLOOD SUGAR  150 OR MORE).   Linzess 145 MCG Caps capsule Generic drug: linaclotide Take 145-290 mcg by mouth daily as needed (constipation).   metFORMIN 1000 MG tablet Commonly known as: GLUCOPHAGE Take 1 tablet (1,000 mg total) by mouth 2 (two) times daily with a meal.   oxyCODONE-acetaminophen 10-325 MG tablet Commonly known as: Percocet Take 1 tablet by mouth every 4 (four) hours as needed for pain.   rOPINIRole 0.5 MG tablet Commonly known as: REQUIP Take 0.5 mg by mouth at bedtime.   testosterone cypionate 200 MG/ML injection Commonly known as: DEPOTESTOSTERONE CYPIONATE Inject 200 mg into the muscle every 28 (twenty-eight) days.   tiZANidine 4 MG tablet Commonly known as: ZANAFLEX Take 4 mg by mouth 3 (three) times daily as needed for muscle spasms.   trolamine salicylate 10 % cream Commonly known as: ASPERCREME Apply 1 application topically 2 (two) times daily as needed for muscle pain.   Vitamin D 50 MCG (2000 UT) tablet Take 2,000 Units by mouth daily.   Zestoretic 20-12.5 MG tablet Generic drug: lisinopril-hydrochlorothiazide Take 1 tablet by mouth daily.       FOLLOW UP VISIT:   Follow-up Information    Justice Britain, MD.   Specialty: Orthopedic Surgery Why: call to be seen in 10-14 days Contact information: [address] [place] [address] [phone]           DISCHARGE TO: Home   DISCHARGE CONDITION:  Thereasa Parkin Inri Sobieski for Dr. Justice Britain 03/27/2019, 8:36 AM

## 2019-04-06 DIAGNOSIS — Z96612 Presence of left artificial shoulder joint: Secondary | ICD-10-CM | POA: Diagnosis not present

## 2019-04-06 DIAGNOSIS — Z471 Aftercare following joint replacement surgery: Secondary | ICD-10-CM | POA: Diagnosis not present

## 2019-04-07 ENCOUNTER — Telehealth: Payer: Self-pay | Admitting: Physical Therapy

## 2019-04-07 NOTE — Telephone Encounter (Signed)
The patient has been informed of current processes in place at Outpatient Rehab to protect patients from Covid-19 exposure including social distancing, schedule modifications, and new cleaning procedures. After discussing their particular risk with a therapist based on the patient's personal risk factors, the patient has decided to proceed with in-person therapy  

## 2019-04-08 ENCOUNTER — Other Ambulatory Visit: Payer: Self-pay

## 2019-04-08 ENCOUNTER — Encounter: Payer: Self-pay | Admitting: Physical Therapy

## 2019-04-08 ENCOUNTER — Ambulatory Visit: Payer: PPO | Attending: Orthopedic Surgery | Admitting: Physical Therapy

## 2019-04-08 DIAGNOSIS — M6281 Muscle weakness (generalized): Secondary | ICD-10-CM

## 2019-04-08 DIAGNOSIS — M25512 Pain in left shoulder: Secondary | ICD-10-CM | POA: Diagnosis not present

## 2019-04-08 DIAGNOSIS — R29898 Other symptoms and signs involving the musculoskeletal system: Secondary | ICD-10-CM

## 2019-04-08 DIAGNOSIS — Z96612 Presence of left artificial shoulder joint: Secondary | ICD-10-CM | POA: Diagnosis not present

## 2019-04-08 NOTE — Therapy (Signed)
[place], [name], [address] Phone: [phone]   Fax:  [phone]  Physical Therapy Evaluation  Patient Details  Name: Justin Trujillo MRN: [id number] Date of Birth: 1946/08/28 Referring Provider (PT): Dr. Justice Britain    Encounter Date: 04/08/2019  PT End of Session - 04/08/19 1828    Visit Number  1    Number of Visits  16    Date for PT Re-Evaluation  06/03/19    Authorization Type  [employer]    PT Start Time  1535    PT Stop Time  1618    PT Time Calculation (min)  43 min    Activity Tolerance  Patient tolerated treatment well    Behavior During Therapy  [facility] for tasks assessed/performed       Past Medical History:  Diagnosis Date  . Arthritis   . Coronary atherosclerosis of native coronary artery   . Hypogonadism male   . Obstructive chronic bronchitis with exacerbation ([place])   . Obstructive sleep apnea (adult) (pediatric)    no cpap  . Other and unspecified hyperlipidemia   . Other emphysema ([place])   . Proteinuria 2019   has seen a nephrologist  . Shortness of breath    with excertion  . Type II or unspecified type diabetes mellitus without mention of complication, not stated as uncontrolled    type 2  . Unspecified essential hypertension     Past Surgical History:  Procedure Laterality Date  . CARDIAC CATHETERIZATION    . IR KYPHO LUMBAR INC FX REDUCE BONE BX UNI/BIL CANNULATION INC/IMAGING  06/13/2018  . L lens implant    . R knee replacement x2    . REVERSE SHOULDER ARTHROPLASTY Left 03/26/2019   Procedure: REVERSE SHOULDER ARTHROPLASTY;  Surgeon: Justice Britain, MD;  Location: [facility];  Service: Orthopedics;  Laterality: Left;  142min  . TONSILLECTOMY    . TONSILLECTOMY AND ADENOIDECTOMY    . vascectomy      There were no vitals filed for this visit.   Subjective Assessment - 04/08/19 1539    Subjective  Pt underwent L RTSA  on 03/26/19.  He is doing well overall.  He  wears the sling most of time.  He has been working his elbow a bit.  He has good pain control overall but it is very difficult as it is his dominant arm.    Pertinent History  Lumbar fusion Feb. 2020.  Rt knee surgeries, Rt shoulder surgery, CODP, emphysema, CAD, obesity , diabetes    Limitations  Lifting;Other (comment)   using arm, sleep   How long can you stand comfortably?  only 5-10 min due to back pain    Patient Stated Goals  Regain strength in LUE    Currently in Pain?  Yes    Pain Score  3     Pain Location  Shoulder    Pain Orientation  Left;Lateral    Pain Descriptors / Indicators  Aching    Pain Type  Surgical pain    Pain Onset  1 to 4 weeks ago    Pain Frequency  Intermittent    Aggravating Factors   moving it wrong, malposition    Pain Relieving Factors  sling,         [facility] PT Assessment - 04/08/19 0001      Assessment   Medical Diagnosis  L Reverse TSA     Referring Provider (PT)  Dr. Justice Britain  Onset Date/Surgical Date  03/26/19    Hand Dominance  Left    Next MD Visit  4 weeks post op     Prior Therapy  not for shoulder       Precautions   Precautions  Shoulder    Type of Shoulder Precautions  no internal rotation for now, avoid extension, add and IR for 1 st 6 weeks or more     Precaution Comments  sling unless resting seated       Restrictions   Weight Bearing Restrictions  No      Balance Screen   Has the patient fallen in the past 6 months  Yes    How many times?  2    Has the patient had a decrease in activity level because of a fear of falling?   Yes    Is the patient reluctant to leave their home because of a fear of falling?   No      Home Film/video editor residence    Living Arrangements  Spouse/significant other    Type of [place]    Additional Comments  spouse with memory issues, anxiety but independent       Prior Function   Level of Independence  --   needs help with home tasks and dressing ,  driving    Vocation  Retired    Tax inspector     Leisure  woodworking and Engineer, civil (consulting)   Overall Cognitive Status  Within Functional Limits for tasks assessed      Observation/Other Assessments   Focus on Therapeutic Outcomes (FOTO)   68%      Sensation   Light Touch  Appears Intact      Posture/Postural Control   Posture/Postural Control  Postural limitations    Posture Comments  guarded LUE       PROM   Overall PROM Comments  limited by pain, not end feel     Left Shoulder Flexion  95 Degrees    Left Shoulder ABduction  88 Degrees    Left Shoulder External Rotation  35 Degrees      Palpation   Palpation comment  tender grossly along anterior lateral shoulder           Objective measurements completed on examination: See above findings.       PT Education - 04/08/19 1828    Education Details  PT/POC, HEP, see education    Person(s) Educated  Patient    Methods  Explanation;Demonstration;Handout    Comprehension  Verbalized understanding;Returned demonstration;Need further instruction;Verbal cues required       PT Short Term Goals - 04/08/19 1829      PT SHORT TERM GOAL #1   Title  Pt will be I with initial HEP    Time  4    Period  Weeks    Status  New    Target Date  05/06/19      PT SHORT TERM GOAL #2   Title  Pt will be able utilize RICE for home, self care and pain mgmt.    Time  4    Period  Weeks    Status  New    Target Date  05/06/19      PT SHORT TERM GOAL #3   Title  Pt will be able to perform AAROM flexion and abduction to 110 deg in supine to work towards full  function.    Time  4    Period  Weeks    Status  New    Target Date  05/06/19      PT SHORT TERM GOAL #4   Title  Pt will wean out of sling as able per MD for >75% of the time    Time  4    Period  Weeks    Status  New    Target Date  05/06/19        PT Long Term Goals - 04/08/19 1832      PT LONG TERM GOAL #1   Title  Pt will score <  40% impaired on FOTO to demonstrate functional mobility and use of dominant arm.    Time  8    Period  Weeks    Status  New    Target Date  06/03/19      PT LONG TERM GOAL #2   Title  Pt will be able to show 4+/5 strength in L UE or more for maximal function.    Time  8    Period  Weeks    Status  New    Target Date  06/03/19      PT LONG TERM GOAL #3   Title  Pt will complete ADLs without lasting increased pain in L UE, dressing and overhead    Time  8    Period  Weeks    Status  New    Target Date  06/03/19      PT LONG TERM GOAL #4   Title  Pt will be able to safely lift bowl, pot/pan with LUE for cooking, baking and use L UE to stir.    Time  8    Period  Weeks    Status  New    Target Date  06/03/19      PT LONG TERM GOAL #5   Title  Pt will be I with final HEP for L UE ROM and strengthening    Time  8    Period  Weeks    Status  New    Target Date  06/03/19             Plan - 04/08/19 1617    Clinical Impression Statement  Patient presents for low complexity eval of L shoulder s/p Reverse total shoulder arthroscopy. He tolerated PROM well, avoiding IR and extension/adduction. He has expected limitations in strength, ROM and pain. Will follow protocol and add in AAROM beginning at 6 weeks.  He should do well with skilled PT intervention and is motivated to improve.    Personal Factors and Comorbidities  Age;Comorbidity 1;Comorbidity 2    Comorbidities  multiple surgeries, COPD, diabetes    Examination-Activity Limitations  Bathing;Reach Overhead;Dressing;Bed Mobility;Hygiene/Grooming;Toileting;Lift;Sleep;Carry    Examination-Participation Restrictions  Driving;Yard Work;Laundry;Meal Prep;Cleaning    Stability/Clinical Decision Making  Stable/Uncomplicated    Clinical Decision Making  Low    Rehab Potential  Excellent    PT Frequency  2x / week    PT Duration  8 weeks    PT Treatment/Interventions  ADLs/Self Care Home Management;Cryotherapy;Electrical  Stimulation;Moist Heat;Ultrasound;Functional mobility training;Manual techniques;Passive range of motion;Vasopneumatic Device;Taping;Therapeutic exercise;Iontophoresis 4mg /ml Dexamethasone;Therapeutic activities;Neuromuscular re-education;Patient/family education    PT Next Visit Plan  PROM, vaso, manual    PT Home Exercise Plan  table slides, pendulums, isometric deltoid    Consulted and Agree with Plan of Care  Patient       Patient will benefit from skilled  therapeutic intervention in order to improve the following deficits and impairments:  Decreased range of motion, Decreased strength, Hypomobility, Decreased mobility, Decreased balance, Obesity, Postural dysfunction, Impaired flexibility, Impaired UE functional use, Pain  Visit Diagnosis: 1. S/P reverse total shoulder arthroplasty, left   2. Acute pain of left shoulder   3. Muscle weakness (generalized)   4. Other symptoms and signs involving the musculoskeletal system        Problem List Patient Active Problem List   Diagnosis Date Noted  . 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 Lumbar stenosis with neurogenic claudication 10/28/2018  . DM type 2 (diabetes mellitus, type 2) ([place]) 01/15/2012  . CAD (coronary artery disease) 01/15/2012  . HTN (hypertension) 01/15/2012  . Dyslipidemia 01/15/2012  . COPD (chronic obstructive pulmonary disease); chronic bronchitis 01/15/2012  . Tobacco abuse 01/15/2012  . BMI 40.0-44.9, adult ([place]) 01/15/2012  . OSA (obstructive sleep apnea) 01/15/2012  . Insomnia 01/15/2012  . IBS (irritable bowel syndrome) 01/15/2012  . Hypogonadism male 01/15/2012  . DJD (degenerative joint disease) 01/15/2012  . Rotator cuff arthropathy, left 01/15/2012  . GERD (gastroesophageal reflux disease) 01/15/2012  . CHEST PAIN, ATYPICAL 07/12/2010  . TOBACCO USER 10/20/2009  . CAD, NATIVE VESSEL 09/29/2009  . EMPHYSEMA 09/21/2009  . DM 08/31/2009  . HYPERLIPIDEMIA 08/31/2009  .  OBSTRUCTIVE SLEEP APNEA 08/31/2009  . Essential hypertension 08/31/2009  . CHRONIC OBSTRUCTIVE PULMONARY DISEASE, ACUTE EXACERBATION 08/31/2009  . DYSPNEA 08/31/2009    Ennis Heavner 04/08/2019, 7:03 PM  [facility] [address] [place], [name], [address] Phone: [phone]   Fax:  [phone]  Name: Justin Trujillo MRN: [id number] Date of Birth: Jun 14, 1946  Raeford Razor, PT 04/08/19 7:03 PM Phone: [phone] Fax: [phone]

## 2019-04-08 NOTE — Patient Instructions (Signed)
Seated table slides PROM flexion and scaption x 2-3 times per day, 10 sec x 10 reps, 2 sets  Pendulums Elbow flexion, ext Forearm supination, pronation Grip Isometric deltoid x 10 x 5 sec x 1 per day standing ICE x 2-4 times per day  Sling most of time Ask MD about driving

## 2019-04-20 ENCOUNTER — Other Ambulatory Visit: Payer: Self-pay

## 2019-04-20 ENCOUNTER — Encounter: Payer: Self-pay | Admitting: Physical Therapy

## 2019-04-20 ENCOUNTER — Ambulatory Visit: Payer: PPO | Attending: Orthopedic Surgery | Admitting: Physical Therapy

## 2019-04-20 DIAGNOSIS — R29898 Other symptoms and signs involving the musculoskeletal system: Secondary | ICD-10-CM | POA: Diagnosis not present

## 2019-04-20 DIAGNOSIS — Z96612 Presence of left artificial shoulder joint: Secondary | ICD-10-CM | POA: Diagnosis not present

## 2019-04-20 DIAGNOSIS — M6281 Muscle weakness (generalized): Secondary | ICD-10-CM | POA: Diagnosis not present

## 2019-04-20 DIAGNOSIS — M25512 Pain in left shoulder: Secondary | ICD-10-CM | POA: Diagnosis not present

## 2019-04-20 NOTE — Therapy (Addendum)
[place], [name], [address] Phone: [phone]   Fax:  [phone]  Physical Therapy Treatment  Patient Details  Name: Justin Trujillo MRN: [id number] Date of Birth: Jan 23, 1946 Referring Provider (PT): Dr. Justice Britain    Encounter Date: 04/20/2019  PT End of Session - 04/20/19 1221    Visit Number  2    Number of Visits  16    Date for PT Re-Evaluation  06/03/19    Authorization Type  healt[employer] advantage    PT Start Time  1218    PT Stop Time  1309    PT Time Calculation (min)  51 min    Activity Tolerance  Patient tolerated treatment well    Behavior During Therapy  [facility] for tasks assessed/performed       Past Medical History:  Diagnosis Date  . Arthritis   . Coronary atherosclerosis of native coronary artery   . Hypogonadism male   . Obstructive chronic bronchitis with exacerbation ([place])   . Obstructive sleep apnea (adult) (pediatric)    no cpap  . Other and unspecified hyperlipidemia   . Other emphysema ([place])   . Proteinuria 2019   has seen a nephrologist  . Shortness of breath    with excertion  . Type II or unspecified type diabetes mellitus without mention of complication, not stated as uncontrolled    type 2  . Unspecified essential hypertension     Past Surgical History:  Procedure Laterality Date  . CARDIAC CATHETERIZATION    . IR KYPHO LUMBAR INC FX REDUCE BONE BX UNI/BIL CANNULATION INC/IMAGING  06/13/2018  . L lens implant    . R knee replacement x2    . REVERSE SHOULDER ARTHROPLASTY Left 03/26/2019   Procedure: REVERSE SHOULDER ARTHROPLASTY;  Surgeon: Justice Britain, MD;  Location: [facility];  Service: Orthopedics;  Laterality: Left;  124min  . TONSILLECTOMY    . TONSILLECTOMY AND ADENOIDECTOMY    . vascectomy      There were no vitals filed for this visit.  Subjective Assessment - 04/20/19 1219    Subjective  Ive been doing my exercises and I added a few.  Its hard to get my  arm back to pull up my pants.    Currently in Pain?  Yes    Pain Score  2     Pain Location  Shoulder    Pain Orientation  Left    Pain Descriptors / Indicators  Aching    Pain Type  Surgical pain    Pain Onset  More than a month ago    Pain Frequency  Intermittent         [facility] PT Assessment - 04/20/19 0001      PROM   Left Shoulder Flexion  115 Degrees   115 to 120 deg    Left Shoulder ABduction  100 Degrees        [facility] Adult PT Treatment/Exercise - 04/20/19 0001      Shoulder Exercises: Supine   External Rotation  AAROM;Left;15 reps    External Rotation Weight (lbs)  UE ranger     Flexion  AAROM;Right;Left;15 reps    Shoulder Flexion Weight (lbs)  UE ranger     Other Supine Exercises  scapular squeeze x 10     Other Supine Exercises  bicep curls 2 lbs x 20    supination/pronation 2 lbs x 10 mod cues      Shoulder Exercises: Seated   Flexion  PROM;Both;10 reps    Abduction  PROM;Both;10 reps    ABduction Weight (lbs)  scaption     Other Seated Exercises  UE Ranger circles      Shoulder Exercises: Pulleys   Flexion  1 minute    Flexion Limitations  to 112 deg     Scaption  1 minute    Scaption Limitations  4/10 pain       Cryotherapy   Number Minutes Cryotherapy  8 Minutes    Cryotherapy Location  Shoulder    Type of Cryotherapy  Ice pack      Manual Therapy   Manual Therapy  Passive ROM    Manual therapy comments  ER to tolerance in plane of scapula, flexion and elevation to 120 deg in supine     Passive ROM  within protocol                PT Short Term Goals - 04/08/19 1829      PT SHORT TERM GOAL #1   Title  Pt will be I with initial HEP    Time  4    Period  Weeks    Status  New    Target Date  05/06/19      PT SHORT TERM GOAL #2   Title  Pt will be able utilize RICE for home, self care and pain mgmt.    Time  4    Period  Weeks    Status  New    Target Date  05/06/19      PT SHORT TERM GOAL #3   Title  Pt will be able to perform  AAROM flexion and abduction to 110 deg in supine to work towards full function.    Time  4    Period  Weeks    Status  New    Target Date  05/06/19      PT SHORT TERM GOAL #4   Title  Pt will wean out of sling as able per MD for >75% of the time    Time  4    Period  Weeks    Status  New    Target Date  05/06/19        PT Long Term Goals - 04/08/19 1832      PT LONG TERM GOAL #1   Title  Pt will score < 40% impaired on FOTO to demonstrate functional mobility and use of dominant arm.    Time  8    Period  Weeks    Status  New    Target Date  06/03/19      PT LONG TERM GOAL #2   Title  Pt will be able to show 4+/5 strength in L UE or more for maximal function.    Time  8    Period  Weeks    Status  New    Target Date  06/03/19      PT LONG TERM GOAL #3   Title  Pt will complete ADLs without lasting increased pain in L UE, dressing and overhead    Time  8    Period  Weeks    Status  New    Target Date  06/03/19      PT LONG TERM GOAL #4   Title  Pt will be able to safely lift bowl, pot/pan with LUE for cooking, baking and use L UE to stir.    Time  8    Period  Weeks    Status  New    Target Date  06/03/19      PT LONG TERM GOAL #5   Title  Pt will be I with final HEP for L UE ROM and strengthening    Time  8    Period  Weeks    Status  New    Target Date  06/03/19            Plan - 04/20/19 1259    Clinical Impression Statement  Pt doing well, works hard and does need cues to avoid AROM until 6 weeks.  He will push past pain >5/10 with AAROM.  Doing HEP and improved tolerance for PROM.    Personal Factors and Comorbidities  Age;Comorbidity 1;Comorbidity 2    Comorbidities  multiple surgeries, COPD, diabetes    Examination-Activity Limitations  Bathing;Reach Overhead;Dressing;Bed Mobility;Hygiene/Grooming;Toileting;Lift;Sleep;Carry    Examination-Participation Restrictions  Driving;Yard Work;Laundry;Meal Prep;Cleaning    Stability/Clinical Decision  Making  Stable/Uncomplicated    PT Frequency  2x / week    PT Duration  8 weeks    PT Treatment/Interventions  ADLs/Self Care Home Management;Cryotherapy;Electrical Stimulation;Moist Heat;Ultrasound;Functional mobility training;Manual techniques;Passive range of motion;Vasopneumatic Device;Taping;Therapeutic exercise;Iontophoresis 4mg /ml Dexamethasone;Therapeutic activities;Neuromuscular re-education;Patient/family education    PT Next Visit Plan  PROM, vaso, manual    PT Home Exercise Plan  table slides, pendulums, isometric deltoid    Consulted and Agree with Plan of Care  Patient       Patient will benefit from skilled therapeutic intervention in order to improve the following deficits and impairments:  Decreased range of motion, Decreased strength, Hypomobility, Decreased mobility, Decreased balance, Obesity, Postural dysfunction, Impaired flexibility, Impaired UE functional use, Pain  Visit Diagnosis: 1. S/P reverse total shoulder arthroplasty, left   2. Acute pain of left shoulder   3. Muscle weakness (generalized)   4. Other symptoms and signs involving the musculoskeletal system        Problem List Patient Active Problem List   Diagnosis Date Noted  . 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 Lumbar stenosis with neurogenic claudication 10/28/2018  . DM type 2 (diabetes mellitus, type 2) ([place]) 01/15/2012  . CAD (coronary artery disease) 01/15/2012  . HTN (hypertension) 01/15/2012  . Dyslipidemia 01/15/2012  . COPD (chronic obstructive pulmonary disease); chronic bronchitis 01/15/2012  . Tobacco abuse 01/15/2012  . BMI 40.0-44.9, adult ([place]) 01/15/2012  . OSA (obstructive sleep apnea) 01/15/2012  . Insomnia 01/15/2012  . IBS (irritable bowel syndrome) 01/15/2012  . Hypogonadism male 01/15/2012  . DJD (degenerative joint disease) 01/15/2012  . Rotator cuff arthropathy, left 01/15/2012  . GERD (gastroesophageal reflux disease) 01/15/2012  . CHEST PAIN,  ATYPICAL 07/12/2010  . TOBACCO USER 10/20/2009  . CAD, NATIVE VESSEL 09/29/2009  . EMPHYSEMA 09/21/2009  . DM 08/31/2009  . HYPERLIPIDEMIA 08/31/2009  . OBSTRUCTIVE SLEEP APNEA 08/31/2009  . Essential hypertension 08/31/2009  . CHRONIC OBSTRUCTIVE PULMONARY DISEASE, ACUTE EXACERBATION 08/31/2009  . DYSPNEA 08/31/2009    , 04/20/2019, 1:04 PM  [facility] [address] [place], [name], [address] Phone: [phone]   Fax:  [phone]  Name: CAMEO SHEWELL MRN: [id number] Date of Birth: [birth year]  Raeford Razor, PT 04/20/19 1:04 PM Phone: [phone] Fax: [phone]

## 2019-04-22 ENCOUNTER — Ambulatory Visit: Payer: PPO | Admitting: Physical Therapy

## 2019-04-22 ENCOUNTER — Other Ambulatory Visit: Payer: Self-pay

## 2019-04-22 DIAGNOSIS — Z96612 Presence of left artificial shoulder joint: Secondary | ICD-10-CM

## 2019-04-22 DIAGNOSIS — M25512 Pain in left shoulder: Secondary | ICD-10-CM

## 2019-04-22 DIAGNOSIS — M6281 Muscle weakness (generalized): Secondary | ICD-10-CM

## 2019-04-22 DIAGNOSIS — R29898 Other symptoms and signs involving the musculoskeletal system: Secondary | ICD-10-CM

## 2019-04-22 NOTE — Patient Instructions (Signed)
  Strengthening: Isometric Flexion  Using wall for resistance, press right fist into ball using light pressure. Hold ___5_ seconds. Repeat ___10_ times per set. Do __1__ sets per session. Do __2__ sessions per day.  SHOULDER: Abduction (Isometric)  Use wall as resistance. Press arm against pillow. Keep elbow straight. Hold _5__ seconds. _10__ reps per set, __2_ sets per day, __7_ days per week  Extension (Isometric)  Place left bent elbow and back of arm against wall. Press elbow against wall. Hold __5__ seconds. Repeat ___10_ times. Do __2__ sessions per day.  Internal Rotation (Isometric)  Place palm of right fist against door frame, with elbow bent. Press fist against door frame. Hold ____ seconds. Repeat ____ times. Do ____ sessions per day.  External Rotation (Isometric)  Place back of left fist against door frame, with elbow bent. Press fist against door frame. Hold __5__ seconds. Repeat _10___ times. Do __2__ sessions per day.  Copyright  VHI. All rights reserved.     Cane Exercise: Flexion    Lie on back, holding cane above chest. Keeping arms as straight as possible, lower cane toward floor beyond head. Hold _10___ seconds. Repeat ____10 times. Do ___1_ sessions per day. STOP IF PAIN IS TOO SEVERE (>5/10)   http://gt2.exer.us/91   Copyright  VHI. All rights reserved.

## 2019-04-22 NOTE — Therapy (Signed)
[place], [name], [address] Phone: [phone]   Fax:  [phone]  Physical Therapy Treatment  Patient Details  Name: Justin Trujillo MRN: [id number] Date of Birth: June 20, 1946 Referring Provider (PT): Dr. Justice Britain    Encounter Date: 04/22/2019  PT End of Session - 04/22/19 1132    Visit Number  3    Number of Visits  16    Date for PT Re-Evaluation  06/03/19    Authorization Type  [employer]    PT Start Time  1132    PT Stop Time  1222    PT Time Calculation (min)  50 min    Activity Tolerance  Patient tolerated treatment well    Behavior During Therapy  [facility] for tasks assessed/performed       Past Medical History:  Diagnosis Date  . Arthritis   . Coronary atherosclerosis of native coronary artery   . Hypogonadism male   . Obstructive chronic bronchitis with exacerbation ([place])   . Obstructive sleep apnea (adult) (pediatric)    no cpap  . Other and unspecified hyperlipidemia   . Other emphysema ([place])   . Proteinuria 2019   has seen a nephrologist  . Shortness of breath    with excertion  . Type II or unspecified type diabetes mellitus without mention of complication, not stated as uncontrolled    type 2  . Unspecified essential hypertension     Past Surgical History:  Procedure Laterality Date  . CARDIAC CATHETERIZATION    . IR KYPHO LUMBAR INC FX REDUCE BONE BX UNI/BIL CANNULATION INC/IMAGING  06/13/2018  . L lens implant    . R knee replacement x2    . REVERSE SHOULDER ARTHROPLASTY Left 03/26/2019   Procedure: REVERSE SHOULDER ARTHROPLASTY;  Surgeon: Justice Britain, MD;  Location: [facility];  Service: Orthopedics;  Laterality: Left;  157min  . TONSILLECTOMY    . TONSILLECTOMY AND ADENOIDECTOMY    . vascectomy      There were no vitals filed for this visit.  Subjective Assessment - 04/22/19 1135    Subjective  I am sore I felt like I could try the pulley at home but it hurt  really bad.    Currently in Pain?  Yes    Pain Score  4     Pain Location  Shoulder    Pain Orientation  Left    Pain Type  Surgical pain    Pain Onset  More than a month ago    Pain Frequency  Intermittent         [facility] Adult PT Treatment/Exercise - 04/22/19 0001      Self-Care   Other Self-Care Comments   HEP, no AROM pre-6 weeks, isometrics gentle.        Shoulder Exercises: Supine   Flexion  AAROM;Right;Left;15 reps    Shoulder Flexion Weight (lbs)  cane       Shoulder Exercises: Pulleys   Flexion  2 minutes    Scaption  2 minutes      Shoulder Exercises: ROM/Strengthening   Pendulum  A/P, lateral 3 min       Shoulder Exercises: Isometric Strengthening   Flexion  5X5"    Extension  5X5"    External Rotation  5X5"    ABduction  5X5"      Cryotherapy   Number Minutes Cryotherapy  12 Minutes    Cryotherapy Location  Shoulder    Type of Cryotherapy  Ice  pack      Electrical Stimulation   Electrical Stimulation Location  L shoulder     Electrical Stimulation Action  IFC     Electrical Stimulation Parameters  to tol (12)     Electrical Stimulation Goals  Pain      Manual Therapy   Manual Therapy  Passive ROM    Manual therapy comments  ER to tolerance in plane of scapula, flexion and elevation to 120 deg in supine     Passive ROM  within protocol                PT Short Term Goals - 04/08/19 1829      PT SHORT TERM GOAL #1   Title  Pt will be I with initial HEP    Time  4    Period  Weeks    Status  New    Target Date  05/06/19      PT SHORT TERM GOAL #2   Title  Pt will be able utilize RICE for home, self care and pain mgmt.    Time  4    Period  Weeks    Status  New    Target Date  05/06/19      PT SHORT TERM GOAL #3   Title  Pt will be able to perform AAROM flexion and abduction to 110 deg in supine to work towards full function.    Time  4    Period  Weeks    Status  New    Target Date  05/06/19      PT SHORT TERM GOAL #4   Title   Pt will wean out of sling as able per MD for >75% of the time    Time  4    Period  Weeks    Status  New    Target Date  05/06/19        PT Long Term Goals - 04/08/19 1832      PT LONG TERM GOAL #1   Title  Pt will score < 40% impaired on FOTO to demonstrate functional mobility and use of dominant arm.    Time  8    Period  Weeks    Status  New    Target Date  06/03/19      PT LONG TERM GOAL #2   Title  Pt will be able to show 4+/5 strength in L UE or more for maximal function.    Time  8    Period  Weeks    Status  New    Target Date  06/03/19      PT LONG TERM GOAL #3   Title  Pt will complete ADLs without lasting increased pain in L UE, dressing and overhead    Time  8    Period  Weeks    Status  New    Target Date  06/03/19      PT LONG TERM GOAL #4   Title  Pt will be able to safely lift bowl, pot/pan with LUE for cooking, baking and use L UE to stir.    Time  8    Period  Weeks    Status  New    Target Date  06/03/19      PT LONG TERM GOAL #5   Title  Pt will be I with final HEP for L UE ROM and strengthening    Time  8    Period  Weeks  Status  New    Target Date  06/03/19            Plan - 04/22/19 1218    Clinical Impression Statement  Patient with increased pain today, reinforced 6 weeks as the mark to increase active ROM.  Utilized IFC as he has TENS at home which he has used in the past for his lumbar spine. He shows improved PROM in all planes today.    PT Treatment/Interventions  ADLs/Self Care Home Management;Cryotherapy;Electrical Stimulation;Moist Heat;Ultrasound;Functional mobility training;Manual techniques;Passive range of motion;Vasopneumatic Device;Taping;Therapeutic exercise;Iontophoresis 4mg /ml Dexamethasone;Therapeutic activities;Neuromuscular re-education;Patient/family education    PT Next Visit Plan  PROM, AAROM, vaso, manual/ROM    PT Home Exercise Plan  table slides, pendulums, isometrics, cane flexion OK    Consulted and Agree  with Plan of Care  Patient       Patient will benefit from skilled therapeutic intervention in order to improve the following deficits and impairments:  Decreased range of motion, Decreased strength, Hypomobility, Decreased mobility, Decreased balance, Obesity, Postural dysfunction, Impaired flexibility, Impaired UE functional use, Pain  Visit Diagnosis: 1. S/P reverse total shoulder arthroplasty, left   2. Acute pain of left shoulder   3. Muscle weakness (generalized)   4. Other symptoms and signs involving the musculoskeletal system        Problem List Patient Active Problem List   Diagnosis Date Noted  . 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 Lumbar stenosis with neurogenic claudication 10/28/2018  . DM type 2 (diabetes mellitus, type 2) ([place]) 01/15/2012  . CAD (coronary artery disease) 01/15/2012  . HTN (hypertension) 01/15/2012  . Dyslipidemia 01/15/2012  . COPD (chronic obstructive pulmonary disease); chronic bronchitis 01/15/2012  . Tobacco abuse 01/15/2012  . BMI 40.0-44.9, adult ([place]) 01/15/2012  . OSA (obstructive sleep apnea) 01/15/2012  . Insomnia 01/15/2012  . IBS (irritable bowel syndrome) 01/15/2012  . Hypogonadism male 01/15/2012  . DJD (degenerative joint disease) 01/15/2012  . Rotator cuff arthropathy, left 01/15/2012  . GERD (gastroesophageal reflux disease) 01/15/2012  . CHEST PAIN, ATYPICAL 07/12/2010  . TOBACCO USER 10/20/2009  . CAD, NATIVE VESSEL 09/29/2009  . EMPHYSEMA 09/21/2009  . DM 08/31/2009  . HYPERLIPIDEMIA 08/31/2009  . OBSTRUCTIVE SLEEP APNEA 08/31/2009  . Essential hypertension 08/31/2009  . CHRONIC OBSTRUCTIVE PULMONARY DISEASE, ACUTE EXACERBATION 08/31/2009  . DYSPNEA 08/31/2009    PAA,JENNIFER 04/22/2019, 12:21 PM  [facility] [address] [place], [name], [address] Phone: [phone]   Fax:  [phone]  Name: Justin Trujillo MRN: [id number] Date of  Birth: 04/12/1946  Raeford Razor, PT 04/22/19 12:21 PM Phone: [phone] Fax: [phone]

## 2019-05-04 ENCOUNTER — Other Ambulatory Visit: Payer: Self-pay

## 2019-05-04 ENCOUNTER — Ambulatory Visit: Payer: PPO | Admitting: Physical Therapy

## 2019-05-04 ENCOUNTER — Encounter: Payer: Self-pay | Admitting: Physical Therapy

## 2019-05-04 ENCOUNTER — Encounter: Payer: PPO | Admitting: Physical Therapy

## 2019-05-04 DIAGNOSIS — Z96612 Presence of left artificial shoulder joint: Secondary | ICD-10-CM

## 2019-05-04 DIAGNOSIS — M25512 Pain in left shoulder: Secondary | ICD-10-CM

## 2019-05-04 DIAGNOSIS — R29898 Other symptoms and signs involving the musculoskeletal system: Secondary | ICD-10-CM

## 2019-05-04 DIAGNOSIS — M6281 Muscle weakness (generalized): Secondary | ICD-10-CM

## 2019-05-04 NOTE — Patient Instructions (Signed)
Cane CHEST PRESS X 10 x 2-3   Overhead - Supine  Hold cane at thighs with both hands, extend arms straight over head. Hold _10-15__ seconds. Repeat _10__ times. Do _2-3__ times per day.  External Rotation (Eccentric), Active-Assist - Supine (Cane)  Lie on back, affected arm out from side, elbow at 90, forearm forward. Use cane to assist in lifting forearm of affected arm to neutral. Slowly lower for 3-5 seconds. __10_ reps per set, _3__ sets per day, __7_ days per week.

## 2019-05-04 NOTE — Therapy (Signed)
[place], [name], [address] Phone: [phone]   Fax:  [phone]  Physical Therapy Treatment  Patient Details  Name: Justin Trujillo MRN: [id number] Date of Birth: Oct 24, 1945 Referring Provider (PT): Dr. Justice Britain    Encounter Date: 05/04/2019  PT End of Session - 05/04/19 0847    Visit Number  4    Number of Visits  16    Date for PT Re-Evaluation  06/03/19    Authorization Type  [employer]alt[employer]    PT Start Time  0835    PT Stop Time  0920    PT Time Calculation (min)  45 min    Activity Tolerance  Patient tolerated treatment well    Behavior During Therapy  [facility] for tasks assessed/performed       Past Medical History:  Diagnosis Date  . Arthritis   . Coronary atherosclerosis of native coronary artery   . Hypogonadism male   . Obstructive chronic bronchitis with exacerbation ([place])   . Obstructive sleep apnea (adult) (pediatric)    no cpap  . Other and unspecified hyperlipidemia   . Other emphysema ([place])   . Proteinuria 2019   has seen a nephrologist  . Shortness of breath    with excertion  . Type II or unspecified type diabetes mellitus without mention of complication, not stated as uncontrolled    type 2  . Unspecified essential hypertension     Past Surgical History:  Procedure Laterality Date  . CARDIAC CATHETERIZATION    . IR KYPHO LUMBAR INC FX REDUCE BONE BX UNI/BIL CANNULATION INC/IMAGING  06/13/2018  . L lens implant    . R knee replacement x2    . REVERSE SHOULDER ARTHROPLASTY Left 03/26/2019   Procedure: REVERSE SHOULDER ARTHROPLASTY;  Surgeon: Justice Britain, MD;  Location: [facility];  Service: Orthopedics;  Laterality: Left;  135min  . TONSILLECTOMY    . TONSILLECTOMY AND ADENOIDECTOMY    . vascectomy      There were no vitals filed for this visit.  Subjective Assessment - 05/04/19 0841    Subjective  I am stiff and sore, did not rate pain.    Currently in Pain?   Yes    Pain Score  4     Pain Location  Shoulder    Pain Orientation  Left    Pain Descriptors / Indicators  Aching;Sore    Pain Type  Surgical pain    Pain Onset  More than a month ago    Pain Frequency  Intermittent    Aggravating Factors   moving it wrong    Pain Relieving Factors  ice, sling, rest         [facility] PT Assessment - 05/04/19 0001      AROM   Left Shoulder Flexion  122 Degrees    Left Shoulder ABduction  105 Degrees    Left Shoulder Internal Rotation  55 Degrees   elbow at 90 deg    Left Shoulder External Rotation  50 Degrees      PROM   Left Shoulder Flexion  --    Left Shoulder ABduction  --        [facility] Adult PT Treatment/Exercise - 05/04/19 0001      Shoulder Exercises: Supine   External Rotation  AAROM;Left;15 reps    External Rotation Weight (lbs)  cane     Flexion  AAROM;Right;Left;15 reps    Shoulder Flexion Weight (lbs)  cane  Shoulder Exercises: Seated   Flexion Weight (lbs)  chest press x 10, no pain, just weakness    ABduction Weight (lbs)  scaption to 90 deg       Shoulder Exercises: Pulleys   Flexion  3 minutes    Scaption  2 minutes      Shoulder Exercises: Isometric Strengthening   Flexion  5X10"    Extension  5X10"    External Rotation  5X10"    External Rotation Limitations  used hand     ABduction  5X10"      Manual Therapy   Manual Therapy  Passive ROM    Manual therapy comments  ER to tolerance in plane of scapula, flexion and elevation to 120 deg in supine     Passive ROM  within protocol              PT Education - 05/04/19 0857    Education Details  HEP for supine cane    Person(s) Educated  Patient    Methods  Explanation;Handout    Comprehension  Verbalized understanding;Tactile cues required       PT Short Term Goals - 05/04/19 0848      PT SHORT TERM GOAL #1   Title  Pt will be I with initial HEP    Status  Achieved      PT SHORT TERM GOAL #2   Title  Pt will be able utilize RICE for home,  self care and pain mgmt.    Status  Achieved      PT SHORT TERM GOAL #3   Title  Pt will be able to perform AAROM flexion and abduction to 110 deg in supine to work towards full function.    Status  Achieved      PT SHORT TERM GOAL #4   Title  Pt will wean out of sling as able per MD for >75% of the time    Baseline  out for 2 weeks    Status  Achieved        PT Long Term Goals - 04/08/19 1832      PT LONG TERM GOAL #1   Title  Pt will score < 40% impaired on FOTO to demonstrate functional mobility and use of dominant arm.    Time  8    Period  Weeks    Status  New    Target Date  06/03/19      PT LONG TERM GOAL #2   Title  Pt will be able to show 4+/5 strength in L UE or more for maximal function.    Time  8    Period  Weeks    Status  New    Target Date  06/03/19      PT LONG TERM GOAL #3   Title  Pt will complete ADLs without lasting increased pain in L UE, dressing and overhead    Time  8    Period  Weeks    Status  New    Target Date  06/03/19      PT LONG TERM GOAL #4   Title  Pt will be able to safely lift bowl, pot/pan with LUE for cooking, baking and use L UE to stir.    Time  8    Period  Weeks    Status  New    Target Date  06/03/19      PT LONG TERM GOAL #5   Title  Pt will be  I with final HEP for L UE ROM and strengthening    Time  8    Period  Weeks    Status  New    Target Date  06/03/19            Plan - 05/04/19 0911    Clinical Impression Statement  Patient is doing well his AROM is improved and he needs only min cues for isometric techniques. He is limited in some exercises due to back pain    PT Treatment/Interventions  ADLs/Self Care Home Management;Cryotherapy;Electrical Stimulation;Moist Heat;Ultrasound;Functional mobility training;Manual techniques;Passive range of motion;Vasopneumatic Device;Taping;Therapeutic exercise;Iontophoresis 4mg /ml Dexamethasone;Therapeutic activities;Neuromuscular re-education;Patient/family education     PT Next Visit Plan  what did MD say 6 weeks post  AAROM, vaso, manual/ROM    PT Home Exercise Plan  table slides, pendulums, isometrics, cane flexion OK    Consulted and Agree with Plan of Care  Patient       Patient will benefit from skilled therapeutic intervention in order to improve the following deficits and impairments:  Decreased range of motion, Decreased strength, Hypomobility, Decreased mobility, Decreased balance, Obesity, Postural dysfunction, Impaired flexibility, Impaired UE functional use, Pain  Visit Diagnosis: 1. S/P reverse total shoulder arthroplasty, left   2. Acute pain of left shoulder   3. Muscle weakness (generalized)   4. Other symptoms and signs involving the musculoskeletal system        Problem List Patient Active Problem List   Diagnosis Date Noted  . 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 Lumbar stenosis with neurogenic claudication 10/28/2018  . DM type 2 (diabetes mellitus, type 2) ([place]) 01/15/2012  . CAD (coronary artery disease) 01/15/2012  . HTN (hypertension) 01/15/2012  . Dyslipidemia 01/15/2012  . COPD (chronic obstructive pulmonary disease); chronic bronchitis 01/15/2012  . Tobacco abuse 01/15/2012  . BMI 40.0-44.9, adult ([place]) 01/15/2012  . OSA (obstructive sleep apnea) 01/15/2012  . Insomnia 01/15/2012  . IBS (irritable bowel syndrome) 01/15/2012  . Hypogonadism male 01/15/2012  . DJD (degenerative joint disease) 01/15/2012  . Rotator cuff arthropathy, left 01/15/2012  . GERD (gastroesophageal reflux disease) 01/15/2012  . CHEST PAIN, ATYPICAL 07/12/2010  . TOBACCO USER 10/20/2009  . CAD, NATIVE VESSEL 09/29/2009  . EMPHYSEMA 09/21/2009  . DM 08/31/2009  . HYPERLIPIDEMIA 08/31/2009  . OBSTRUCTIVE SLEEP APNEA 08/31/2009  . Essential hypertension 08/31/2009  . CHRONIC OBSTRUCTIVE PULMONARY DISEASE, ACUTE EXACERBATION 08/31/2009  . DYSPNEA 08/31/2009    Margo Lama 05/04/2019, 1:02 PM  [facility] [address] [place], [name], [address] Phone: [phone]   Fax:  [phone]  Name: VANNIE HOCHSTETLER MRN: [id number] Date of Birth: 02/11/1946  Raeford Razor, PT 05/04/19 1:02 PM Phone: [phone] Fax: [phone]

## 2019-05-07 ENCOUNTER — Other Ambulatory Visit: Payer: Self-pay

## 2019-05-07 ENCOUNTER — Ambulatory Visit: Payer: PPO | Admitting: Physical Therapy

## 2019-05-07 ENCOUNTER — Encounter: Payer: Self-pay | Admitting: Physical Therapy

## 2019-05-07 DIAGNOSIS — Z96612 Presence of left artificial shoulder joint: Secondary | ICD-10-CM | POA: Diagnosis not present

## 2019-05-07 DIAGNOSIS — R29898 Other symptoms and signs involving the musculoskeletal system: Secondary | ICD-10-CM

## 2019-05-07 DIAGNOSIS — M6281 Muscle weakness (generalized): Secondary | ICD-10-CM

## 2019-05-07 DIAGNOSIS — M25512 Pain in left shoulder: Secondary | ICD-10-CM

## 2019-05-07 NOTE — Therapy (Signed)
[place], [name], [address] Phone: [phone]   Fax:  [phone]  Physical Therapy Treatment  Patient Details  Name: Justin Trujillo MRN: [id number] Date of Birth: [birth year] Referring Provider (PT): Dr. Justice Britain    Encounter Date: 05/07/2019  PT End of Session - 05/07/19 1240    Visit Number  5    Number of Visits  16    Date for PT Re-Evaluation  06/03/19    Authorization Type  healt[employer]    PT Start Time  1215    PT Stop Time  1305    PT Time Calculation (min)  50 min    Activity Tolerance  Patient tolerated treatment well    Behavior During Therapy  [facility] for tasks assessed/performed       Past Medical History:  Diagnosis Date  . Arthritis   . Coronary atherosclerosis of native coronary artery   . Hypogonadism male   . Obstructive chronic bronchitis with exacerbation ([place])   . Obstructive sleep apnea (adult) (pediatric)    no cpap  . Other and unspecified hyperlipidemia   . Other emphysema ([place])   . Proteinuria 2019   has seen a nephrologist  . Shortness of breath    with excertion  . Type II or unspecified type diabetes mellitus without mention of complication, not stated as uncontrolled    type 2  . Unspecified essential hypertension     Past Surgical History:  Procedure Laterality Date  . CARDIAC CATHETERIZATION    . IR KYPHO LUMBAR INC FX REDUCE BONE BX UNI/BIL CANNULATION INC/IMAGING  06/13/2018  . L lens implant    . R knee replacement x2    . REVERSE SHOULDER ARTHROPLASTY Left 03/26/2019   Procedure: REVERSE SHOULDER ARTHROPLASTY;  Surgeon: Justice Britain, MD;  Location: [facility];  Service: Orthopedics;  Laterality: Left;  139min  . TONSILLECTOMY    . TONSILLECTOMY AND ADENOIDECTOMY    . vascectomy      There were no vitals filed for this visit.      [facility] PT Assessment - 05/07/19 0001      PROM   Left Shoulder Flexion  120 Degrees    Left Shoulder  ABduction  100 Degrees    Left Shoulder Internal Rotation  60 Degrees    Left Shoulder External Rotation  50 Degrees      Strength   Left Shoulder Flexion  3+/5    Left Shoulder Extension  4/5    Left Shoulder Internal Rotation  4/5    Left Shoulder External Rotation  3/5           [facility] Adult PT Treatment/Exercise - 05/07/19 0001      Shoulder Exercises: Supine   Flexion  AAROM;Right;Left;15 reps    Shoulder Flexion Weight (lbs)  cane 1 lb and chest press x 10 , 1 lb      Shoulder Exercises: Seated   External Rotation  AAROM;Left;15 reps    Flexion  AAROM;Left;20 reps    Flexion Weight (lbs)  UE ranger     Abduction  AAROM;Left;20 reps    ABduction Weight (lbs)  UE ranger       Shoulder Exercises: Standing   External Rotation  Strengthening;Both;10 reps    Theraband Level (Shoulder External Rotation)  Level 2 (Red)    External Rotation Weight (lbs)  very difficult    Extension  Strengthening;Both;15 reps    Theraband Level (Shoulder Extension)  Level 2 (  Red)    Row  Strengthening;Both;20 reps;Theraband    Theraband Level (Shoulder Row)  Level 3 (Green)      Shoulder Exercises: Pulleys   Flexion  3 minutes    Scaption  2 minutes      Cryotherapy   Number Minutes Cryotherapy  10 Minutes    Cryotherapy Location  Shoulder    Type of Cryotherapy  Ice pack      Manual Therapy   Manual Therapy  Passive ROM    Manual therapy comments  Er/IR light alternating resistance isometrics     Passive ROM  all planes              PT Education - 05/07/19 1300    Education Details  protocol, safety, HEP, smoking cessation    Person(s) Educated  Patient    Methods  Explanation;Demonstration;Handout    Comprehension  Verbalized understanding;Verbal cues required       PT Short Term Goals - 05/04/19 0848      PT SHORT TERM GOAL #1   Title  Pt will be I with initial HEP    Status  Achieved      PT SHORT TERM GOAL #2   Title  Pt will be able utilize RICE for home,  self care and pain mgmt.    Status  Achieved      PT SHORT TERM GOAL #3   Title  Pt will be able to perform AAROM flexion and abduction to 110 deg in supine to work towards full function.    Status  Achieved      PT SHORT TERM GOAL #4   Title  Pt will wean out of sling as able per MD for >75% of the time    Baseline  out for 2 weeks    Status  Achieved        PT Long Term Goals - 05/07/19 1302      PT LONG TERM GOAL #1   Title  Pt will score < 40% impaired on FOTO to demonstrate functional mobility and use of dominant arm.    Status  On-going      PT LONG TERM GOAL #2   Title  Pt will be able to show 4+/5 strength in L UE or more for maximal function.    Status  On-going      PT LONG TERM GOAL #3   Title  Pt will complete ADLs without lasting increased pain in L UE, dressing and overhead    Status  On-going      PT LONG TERM GOAL #4   Title  Pt will be able to safely lift bowl, pot/pan with LUE for cooking, baking and use L UE to stir.    Status  On-going      PT LONG TERM GOAL #5   Title  Pt will be I with final HEP for L UE ROM and strengthening    Status  On-going            Plan - 05/07/19 1240    Clinical Impression Statement  Patient with pain today, woke lying on L shoulder.  He reports using his own band at home to work on external rotation for a few weeks.  Explained to him te difference of ROM and strengthening and the purpose of a protocol.  He has trouble reaching back in particular  to pull up his pants and for hygiene , limited by protocol as well as pain. He is  now 6 weekds post op and will be working towards sligtly more aggressive ROM and strength.    PT Treatment/Interventions  ADLs/Self Care Home Management;Cryotherapy;Electrical Stimulation;Moist Heat;Ultrasound;Functional mobility training;Manual techniques;Passive range of motion;Vasopneumatic Device;Taping;Therapeutic exercise;Iontophoresis 4mg /ml Dexamethasone;Therapeutic activities;Neuromuscular  re-education;Patient/family education    PT Next Visit Plan  OK for PROM of IR to 50 deg in scapular plane,    PT Home Exercise Plan  table slides, pendulums, isometrics, cane flexion OK.  red band for ER       Patient will benefit from skilled therapeutic intervention in order to improve the following deficits and impairments:     Visit Diagnosis: S/P reverse total shoulder arthroplasty, left  Acute pain of left shoulder  Muscle weakness (generalized)  Other symptoms and signs involving the musculoskeletal system     Problem List Patient Active Problem List   Diagnosis Date Noted  . 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 Lumbar stenosis with neurogenic claudication 10/28/2018  . DM type 2 (diabetes mellitus, type 2) ([place]) 01/15/2012  . CAD (coronary artery disease) 01/15/2012  . HTN (hypertension) 01/15/2012  . Dyslipidemia 01/15/2012  . COPD (chronic obstructive pulmonary disease); chronic bronchitis 01/15/2012  . Tobacco abuse 01/15/2012  . BMI 40.0-44.9, adult ([place]) 01/15/2012  . OSA (obstructive sleep apnea) 01/15/2012  . Insomnia 01/15/2012  . IBS (irritable bowel syndrome) 01/15/2012  . Hypogonadism male 01/15/2012  . DJD (degenerative joint disease) 01/15/2012  . Rotator cuff arthropathy, left 01/15/2012  . GERD (gastroesophageal reflux disease) 01/15/2012  . CHEST PAIN, ATYPICAL 07/12/2010  . TOBACCO USER 10/20/2009  . CAD, NATIVE VESSEL 09/29/2009  . EMPHYSEMA 09/21/2009  . DM 08/31/2009  . HYPERLIPIDEMIA 08/31/2009  . OBSTRUCTIVE SLEEP APNEA 08/31/2009  . Essential hypertension 08/31/2009  . CHRONIC OBSTRUCTIVE PULMONARY DISEASE, ACUTE EXACERBATION 08/31/2009  . DYSPNEA 08/31/2009    Shalamar Crays 05/07/2019, 1:05 PM  [facility] [address] [place], [name], [address] Phone: [phone]   Fax:  [phone]  Name: JOSYAH ACHOR MRN: [id number] Date of Birth:  1946/01/04  Raeford Razor, PT 05/07/19 1:05 PM Phone: [phone] Fax: [phone]

## 2019-05-07 NOTE — Patient Instructions (Signed)
Rotation: External (Single Arm)    Side toward anchor in shoulder width stance with elbow bent to 90, arm across mid-section. Thumb up, pull arm away from body, keeping elbow bent. Repeat __ times per set. Repeat with other arm. Do __ sets per session. Do __ sessions per week. Anchor Height: Waist  [email or web address]   Copyright  VHI. All rights reserved.

## 2019-05-11 ENCOUNTER — Ambulatory Visit: Payer: PPO | Admitting: Physical Therapy

## 2019-05-11 ENCOUNTER — Encounter: Payer: Self-pay | Admitting: Physical Therapy

## 2019-05-11 ENCOUNTER — Other Ambulatory Visit: Payer: Self-pay

## 2019-05-11 DIAGNOSIS — R29898 Other symptoms and signs involving the musculoskeletal system: Secondary | ICD-10-CM

## 2019-05-11 DIAGNOSIS — M6281 Muscle weakness (generalized): Secondary | ICD-10-CM

## 2019-05-11 DIAGNOSIS — Z96612 Presence of left artificial shoulder joint: Secondary | ICD-10-CM

## 2019-05-11 DIAGNOSIS — M25512 Pain in left shoulder: Secondary | ICD-10-CM

## 2019-05-11 NOTE — Therapy (Signed)
[place], [name], [address] Phone: [phone]   Fax:  [phone]  Physical Therapy Treatment  Patient Details  Name: Justin Trujillo MRN: [id number] Date of Birth: 24-Apr-1946 Referring Provider (PT): Dr. Justice Britain    Encounter Date: 05/11/2019  PT End of Session - 05/11/19 1236    Visit Number  6    Number of Visits  16    Date for PT Re-Evaluation  06/03/19    Authorization Type  healt[employer]    PT Start Time  1217    PT Stop Time  1306    PT Time Calculation (min)  49 min    Activity Tolerance  Patient tolerated treatment well    Behavior During Therapy  [facility] for tasks assessed/performed       Past Medical History:  Diagnosis Date  . Arthritis   . Coronary atherosclerosis of native coronary artery   . Hypogonadism male   . Obstructive chronic bronchitis with exacerbation ([place])   . Obstructive sleep apnea (adult) (pediatric)    no cpap  . Other and unspecified hyperlipidemia   . Other emphysema ([place])   . Proteinuria 2019   has seen a nephrologist  . Shortness of breath    with excertion  . Type II or unspecified type diabetes mellitus without mention of complication, not stated as uncontrolled    type 2  . Unspecified essential hypertension     Past Surgical History:  Procedure Laterality Date  . CARDIAC CATHETERIZATION    . IR KYPHO LUMBAR INC FX REDUCE BONE BX UNI/BIL CANNULATION INC/IMAGING  06/13/2018  . L lens implant    . R knee replacement x2    . REVERSE SHOULDER ARTHROPLASTY Left 03/26/2019   Procedure: REVERSE SHOULDER ARTHROPLASTY;  Surgeon: Justice Britain, MD;  Location: [facility];  Service: Orthopedics;  Laterality: Left;  155min  . TONSILLECTOMY    . TONSILLECTOMY AND ADENOIDECTOMY    . vascectomy      There were no vitals filed for this visit.  Subjective Assessment - 05/11/19 1222    Subjective  Pt exhausted today, had company this weekend and did too much.   Pain 4/10-5/10.    Currently in Pain?  Yes    Pain Score  5     Pain Location  Shoulder    Pain Orientation  Left    Pain Descriptors / Indicators  Aching    Pain Type  Surgical pain    Pain Onset  More than a month ago    Pain Frequency  Intermittent         [facility] PT Assessment - 05/11/19 0001      PROM   Left Shoulder Flexion  130 Degrees    Left Shoulder ABduction  105 Degrees        [facility] Adult PT Treatment/Exercise - 05/11/19 0001      Shoulder Exercises: Supine   External Rotation  Strengthening;Both;20 reps;Theraband    Theraband Level (Shoulder External Rotation)  Level 1 (Yellow)    Flexion  Strengthening;Left;15 reps    Shoulder Flexion Weight (lbs)  1 lbs 2 sets     Other Supine Exercises  small ROM circles LUE for scap stab.  x 10 each direction     Other Supine Exercises  triceps ext  1 lbs x 10       Shoulder Exercises: Sidelying   External Rotation  Strengthening;Left;10 reps    External Rotation Weight (lbs)  NO wgt, needed PT assist       Shoulder Exercises: Pulleys   Flexion  2 minutes    Scaption  2 minutes    Other Pulley Exercises  horizontal abduction x 1 min       Shoulder Exercises: ROM/Strengthening   Nustep  L2 UE and light LE for 5 min    [place]  seated circles, forward flexion       Shoulder Exercises: Isometric Strengthening   ABduction Limitations  deltoid with PT x 10       Cryotherapy   Number Minutes Cryotherapy  10 Minutes    Cryotherapy Location  Shoulder    Type of Cryotherapy  Ice pack      Manual Therapy   Manual Therapy  Joint mobilization;Passive ROM    Manual therapy comments  --    Joint Mobilization  Gr I inferior glides     Passive ROM  all planes              PT Education - 05/11/19 1235    Education Details  reducing work through [employer], rest, [employer]) Educated  Patient    Methods  Explanation    Comprehension  Verbalized understanding       PT Short Term Goals - 05/04/19 0848      PT SHORT  TERM GOAL #1   Title  Pt will be I with initial HEP    Status  Achieved      PT SHORT TERM GOAL #2   Title  Pt will be able utilize RICE for home, self care and pain mgmt.    Status  Achieved      PT SHORT TERM GOAL #3   Title  Pt will be able to perform AAROM flexion and abduction to 110 deg in supine to work towards full function.    Status  Achieved      PT SHORT TERM GOAL #4   Title  Pt will wean out of sling as able per MD for >75% of the time    Baseline  out for 2 weeks    Status  Achieved        PT Long Term Goals - 05/07/19 1302      PT LONG TERM GOAL #1   Title  Pt will score < 40% impaired on FOTO to demonstrate functional mobility and use of dominant arm.    Status  On-going      PT LONG TERM GOAL #2   Title  Pt will be able to show 4+/5 strength in L UE or more for maximal function.    Status  On-going      PT LONG TERM GOAL #3   Title  Pt will complete ADLs without lasting increased pain in L UE, dressing and overhead    Status  On-going      PT LONG TERM GOAL #4   Title  Pt will be able to safely lift bowl, pot/pan with LUE for cooking, baking and use L UE to stir.    Status  On-going      PT LONG TERM GOAL #5   Title  Pt will be I with final HEP for L UE ROM and strengthening    Status  On-going            Plan - 05/11/19 1236    Clinical Impression Statement  Increased pain and fatigue today, including low back due to weekend activity.Pt subjectively reports  increased ROM but still very weak in flexion, ER, abduction.  Good deltoid contraction.    PT Treatment/Interventions  ADLs/Self Care Home Management;Cryotherapy;Electrical Stimulation;Moist Heat;Ultrasound;Functional mobility training;Manual techniques;Passive range of motion;Vasopneumatic Device;Taping;Therapeutic exercise;Iontophoresis 4mg /ml Dexamethasone;Therapeutic activities;Neuromuscular re-education;Patient/family education    PT Next Visit Plan  6 weeks, cont ROM, strength , manual as  needed , scap Rhythmic stab, 1 lbs weights    PT Home Exercise Plan  table slides, pendulums, isometrics, cane flexion OK.  red band for ER    Consulted and Agree with Plan of Care  Patient       Patient will benefit from skilled therapeutic intervention in order to improve the following deficits and impairments:  Decreased range of motion, Decreased strength, Hypomobility, Decreased mobility, Decreased balance, Obesity, Postural dysfunction, Impaired flexibility, Impaired UE functional use, Pain  Visit Diagnosis: S/P reverse total shoulder arthroplasty, left  Acute pain of left shoulder  Muscle weakness (generalized)  Other symptoms and signs involving the musculoskeletal system     Problem List Patient Active Problem List   Diagnosis Date Noted  . 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 Lumbar stenosis with neurogenic claudication 10/28/2018  . DM type 2 (diabetes mellitus, type 2) ([place]) 01/15/2012  . CAD (coronary artery disease) 01/15/2012  . HTN (hypertension) 01/15/2012  . Dyslipidemia 01/15/2012  . COPD (chronic obstructive pulmonary disease); chronic bronchitis 01/15/2012  . Tobacco abuse 01/15/2012  . BMI 40.0-44.9, adult ([place]) 01/15/2012  . OSA (obstructive sleep apnea) 01/15/2012  . Insomnia 01/15/2012  . IBS (irritable bowel syndrome) 01/15/2012  . Hypogonadism male 01/15/2012  . DJD (degenerative joint disease) 01/15/2012  . Rotator cuff arthropathy, left 01/15/2012  . GERD (gastroesophageal reflux disease) 01/15/2012  . CHEST PAIN, ATYPICAL 07/12/2010  . TOBACCO USER 10/20/2009  . CAD, NATIVE VESSEL 09/29/2009  . EMPHYSEMA 09/21/2009  . DM 08/31/2009  . HYPERLIPIDEMIA 08/31/2009  . OBSTRUCTIVE SLEEP APNEA 08/31/2009  . Essential hypertension 08/31/2009  . CHRONIC OBSTRUCTIVE PULMONARY DISEASE, ACUTE EXACERBATION 08/31/2009  . DYSPNEA 08/31/2009    Dailen Mcclish 05/11/2019, 1:04 PM  [facility] [address] [place], [name], [address] Phone: [phone]   Fax:  [phone]  Name: Justin Trujillo MRN: [id number] Date of Birth: [birth year]  Raeford Razor, PT 05/11/19 1:04 PM Phone: [phone] Fax: [phone]

## 2019-05-14 ENCOUNTER — Encounter: Payer: Self-pay | Admitting: Physical Therapy

## 2019-05-14 ENCOUNTER — Other Ambulatory Visit: Payer: Self-pay

## 2019-05-14 ENCOUNTER — Ambulatory Visit: Payer: PPO | Admitting: Physical Therapy

## 2019-05-14 DIAGNOSIS — R29898 Other symptoms and signs involving the musculoskeletal system: Secondary | ICD-10-CM

## 2019-05-14 DIAGNOSIS — M6281 Muscle weakness (generalized): Secondary | ICD-10-CM

## 2019-05-14 DIAGNOSIS — Z96612 Presence of left artificial shoulder joint: Secondary | ICD-10-CM | POA: Diagnosis not present

## 2019-05-14 DIAGNOSIS — M25512 Pain in left shoulder: Secondary | ICD-10-CM

## 2019-05-14 NOTE — Therapy (Signed)
[place], [name], [address] Phone: [phone]   Fax:  [phone]  Physical Therapy Treatment  Patient Details  Name: Justin Trujillo MRN: [id number] Date of Birth: [birth year] Referring Provider (PT): Dr. Justice Britain    Encounter Date: 05/14/2019  PT End of Session - 05/14/19 1228    Visit Number  7    Number of Visits  16    Date for PT Re-Evaluation  06/03/19    Authorization Type  healthteam advantage    PT Start Time  1216    PT Stop Time  1310    PT Time Calculation (min)  54 min    Activity Tolerance  Patient tolerated treatment well    Behavior During Therapy  [facility] for tasks assessed/performed       Past Medical History:  Diagnosis Date  . Arthritis   . Coronary atherosclerosis of native coronary artery   . Hypogonadism male   . Obstructive chronic bronchitis with exacerbation ([place])   . Obstructive sleep apnea (adult) (pediatric)    no cpap  . Other and unspecified hyperlipidemia   . Other emphysema ([place])   . Proteinuria 2019   has seen a nephrologist  . Shortness of breath    with excertion  . Type II or unspecified type diabetes mellitus without mention of complication, not stated as uncontrolled    type 2  . Unspecified essential hypertension     Past Surgical History:  Procedure Laterality Date  . CARDIAC CATHETERIZATION    . IR KYPHO LUMBAR INC FX REDUCE BONE BX UNI/BIL CANNULATION INC/IMAGING  06/13/2018  . L lens implant    . R knee replacement x2    . REVERSE SHOULDER ARTHROPLASTY Left 03/26/2019   Procedure: REVERSE SHOULDER ARTHROPLASTY;  Surgeon: Justice Britain, MD;  Location: [facility];  Service: Orthopedics;  Laterality: Left;  162min  . TONSILLECTOMY    . TONSILLECTOMY AND ADENOIDECTOMY    . vascectomy      There were no vitals filed for this visit.  Subjective Assessment - 05/14/19 1214    Subjective  Doing better physically but he is having some issues at home.     Currently in Pain?  Yes    Pain Score  3     Pain Location  Shoulder    Pain Orientation  Left    Pain Descriptors / Indicators  Sore    Pain Type  Surgical pain    Pain Onset  More than a month ago    Aggravating Factors   overdoing it    Pain Relieving Factors  ice, sling, rest             [facility] Adult PT Treatment/Exercise - 05/14/19 0001      Shoulder Exercises: Seated   External Rotation  Strengthening;Left;10 reps   seated    External Rotation Weight (lbs)  1      Shoulder Exercises: Standing   External Rotation  Strengthening;Both;10 reps    External Rotation Weight (lbs)  isometric with PIlates circle     Flexion  AAROM;Both;10 reps    Shoulder Flexion Weight (lbs)  cane     Other Standing Exercises  wall for reaching, posture and overhead    Other Standing Exercises  core press with black ball x 10       Shoulder Exercises: Pulleys   Flexion  2 minutes    Scaption  2 minutes      Shoulder Exercises:  ROM/Strengthening   Ball on Wall  1 min each direction at shoulder hgt.     Other ROM/Strengthening Exercises  seated ER stretch x 10     Other ROM/Strengthening Exercises  ball roll outx flexion and abduction x 10       Cryotherapy   Number Minutes Cryotherapy  10 Minutes    Cryotherapy Location  Shoulder    Type of Cryotherapy  Ice pack      Manual Therapy   Joint Mobilization  Gr I-II  inferior glides , seated with elbow on bolster     Passive ROM  all planes                PT Short Term Goals - 05/04/19 0848      PT SHORT TERM GOAL #1   Title  Pt will be I with initial HEP    Status  Achieved      PT SHORT TERM GOAL #2   Title  Pt will be able utilize RICE for home, self care and pain mgmt.    Status  Achieved      PT SHORT TERM GOAL #3   Title  Pt will be able to perform AAROM flexion and abduction to 110 deg in supine to work towards full function.    Status  Achieved      PT SHORT TERM GOAL #4   Title  Pt will wean out of sling as  able per MD for >75% of the time    Baseline  out for 2 weeks    Status  Achieved        PT Long Term Goals - 05/07/19 1302      PT LONG TERM GOAL #1   Title  Pt will score < 40% impaired on FOTO to demonstrate functional mobility and use of dominant arm.    Status  On-going      PT LONG TERM GOAL #2   Title  Pt will be able to show 4+/5 strength in L UE or more for maximal function.    Status  On-going      PT LONG TERM GOAL #3   Title  Pt will complete ADLs without lasting increased pain in L UE, dressing and overhead    Status  On-going      PT LONG TERM GOAL #4   Title  Pt will be able to safely lift bowl, pot/pan with LUE for cooking, baking and use L UE to stir.    Status  On-going      PT LONG TERM GOAL #5   Title  Pt will be I with final HEP for L UE ROM and strengthening    Status  On-going            Plan - 05/14/19 1305    Clinical Impression Statement  Noticeable increase in shoulder flexion, abduction and ER today, both active and passive. Working towards goals.  May get a Referral from Dr. Ellene Route for treating his low back pain . Needs core work as standing still is painful to him.    PT Treatment/Interventions  ADLs/Self Care Home Management;Cryotherapy;Electrical Stimulation;Moist Heat;Ultrasound;Functional mobility training;Manual techniques;Passive range of motion;Vasopneumatic Device;Taping;Therapeutic exercise;Iontophoresis 4mg /ml Dexamethasone;Therapeutic activities;Neuromuscular re-education;Patient/family education    PT Next Visit Plan  6 weeks, cont ROM, strength , manual as needed , scap Rhythmic stab, 1 lbs weights    PT Home Exercise Plan  table slides, pendulums, isometrics, cane flexion OK.  red band for ER  Patient will benefit from skilled therapeutic intervention in order to improve the following deficits and impairments:  Decreased range of motion, Decreased strength, Hypomobility, Decreased mobility, Decreased balance, Obesity,  Postural dysfunction, Impaired flexibility, Impaired UE functional use, Pain  Visit Diagnosis: S/P reverse total shoulder arthroplasty, left  Acute pain of left shoulder  Muscle weakness (generalized)  Other symptoms and signs involving the musculoskeletal system     Problem List Patient Active Problem List   Diagnosis Date Noted  . 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 Lumbar stenosis with neurogenic claudication 10/28/2018  . DM type 2 (diabetes mellitus, type 2) ([place]) 01/15/2012  . CAD (coronary artery disease) 01/15/2012  . HTN (hypertension) 01/15/2012  . Dyslipidemia 01/15/2012  . COPD (chronic obstructive pulmonary disease); chronic bronchitis 01/15/2012  . Tobacco abuse 01/15/2012  . BMI 40.0-44.9, adult ([place]) 01/15/2012  . OSA (obstructive sleep apnea) 01/15/2012  . Insomnia 01/15/2012  . IBS (irritable bowel syndrome) 01/15/2012  . Hypogonadism male 01/15/2012  . DJD (degenerative joint disease) 01/15/2012  . Rotator cuff arthropathy, left 01/15/2012  . GERD (gastroesophageal reflux disease) 01/15/2012  . CHEST PAIN, ATYPICAL 07/12/2010  . TOBACCO USER 10/20/2009  . CAD, NATIVE VESSEL 09/29/2009  . EMPHYSEMA 09/21/2009  . DM 08/31/2009  . HYPERLIPIDEMIA 08/31/2009  . OBSTRUCTIVE SLEEP APNEA 08/31/2009  . Essential hypertension 08/31/2009  . CHRONIC OBSTRUCTIVE PULMONARY DISEASE, ACUTE EXACERBATION 08/31/2009  . DYSPNEA 08/31/2009    Anshul Meddings 05/14/2019, 1:10 PM  [facility] [address] [place], [name], [address] Phone: [phone]   Fax:  [phone]  Name: SHADDAI PUGLIA MRN: [id number] Date of Birth: October 29, 1945  Raeford Razor, PT 05/14/19 1:10 PM Phone: [phone] Fax: [phone]

## 2019-05-19 ENCOUNTER — Ambulatory Visit: Payer: PPO | Attending: Orthopedic Surgery

## 2019-05-19 ENCOUNTER — Other Ambulatory Visit: Payer: Self-pay

## 2019-05-19 DIAGNOSIS — M25512 Pain in left shoulder: Secondary | ICD-10-CM | POA: Diagnosis not present

## 2019-05-19 DIAGNOSIS — Z96612 Presence of left artificial shoulder joint: Secondary | ICD-10-CM | POA: Diagnosis not present

## 2019-05-19 DIAGNOSIS — M6281 Muscle weakness (generalized): Secondary | ICD-10-CM

## 2019-05-19 DIAGNOSIS — R29898 Other symptoms and signs involving the musculoskeletal system: Secondary | ICD-10-CM | POA: Diagnosis not present

## 2019-05-19 NOTE — Therapy (Signed)
[place], [name], [address] Phone: [phone]   Fax:  [phone]  Physical Therapy Treatment  Patient Details  Name: Justin Trujillo MRN: [id number] Date of Birth: [birth year] Referring Provider (PT): Dr. Justice Britain    Encounter Date: 05/19/2019  PT End of Session - 05/19/19 0921    Visit Number  8    Number of Visits  16    Date for PT Re-Evaluation  06/03/19    Authorization Type  healthteam advantage    PT Start Time  0915    PT Stop Time  0953    PT Time Calculation (min)  38 min    Activity Tolerance  Patient tolerated treatment well    Behavior During Therapy  [facility] for tasks assessed/performed       Past Medical History:  Diagnosis Date  . Arthritis   . Coronary atherosclerosis of native coronary artery   . Hypogonadism male   . Obstructive chronic bronchitis with exacerbation ([place])   . Obstructive sleep apnea (adult) (pediatric)    no cpap  . Other and unspecified hyperlipidemia   . Other emphysema ([place])   . Proteinuria 2019   has seen a nephrologist  . Shortness of breath    with excertion  . Type II or unspecified type diabetes mellitus without mention of complication, not stated as uncontrolled    type 2  . Unspecified essential hypertension     Past Surgical History:  Procedure Laterality Date  . CARDIAC CATHETERIZATION    . IR KYPHO LUMBAR INC FX REDUCE BONE BX UNI/BIL CANNULATION INC/IMAGING  06/13/2018  . L lens implant    . R knee replacement x2    . REVERSE SHOULDER ARTHROPLASTY Left 03/26/2019   Procedure: REVERSE SHOULDER ARTHROPLASTY;  Surgeon: Justice Britain, MD;  Location: [facility];  Service: Orthopedics;  Laterality: Left;  17min  . TONSILLECTOMY    . TONSILLECTOMY AND ADENOIDECTOMY    . vascectomy      There were no vitals filed for this visit.  Subjective Assessment - 05/19/19 0919    Subjective  Pain 2.5/10 today    Pain Score  3     Pain Location  Shoulder     Pain Orientation  Left    Pain Descriptors / Indicators  Sore    Pain Type  Surgical pain    Pain Frequency  Intermittent    Aggravating Factors   using Lt arm    Pain Relieving Factors  cold rest                       [facility] Adult PT Treatment/Exercise - 05/19/19 0001      Shoulder Exercises: Seated   Other Seated Exercises  large ball roll x 20 flexion and hor abduction x 20    Other Seated Exercises  cane stretch abduction and flexion      Shoulder Exercises: Standing   External Rotation Limitations  isometrics x 15 with blue band    Other Standing Exercises  wall slies x 15 reps active      Other Standing Exercises  ball on wall with light support of Lt arm x 2 min with brief rests.       Shoulder Exercises: Pulleys   Flexion  2 minutes    Scaption  2 minutes      Shoulder Exercises: ROM/Strengthening   Nustep  L2 UE and light LE for 4 min  Ball on Wall  1 min higher than  shoulder height    Other ROM/Strengthening Exercises  seated ER stretch x 10     Other ROM/Strengthening Exercises  ball roll outx flexion and abduction x 15      Cryotherapy   Number Minutes Cryotherapy  --    Cryotherapy Location  --    Type of Cryotherapy  --               PT Short Term Goals - 05/04/19 0848      PT SHORT TERM GOAL #1   Title  Pt will be I with initial HEP    Status  Achieved      PT SHORT TERM GOAL #2   Title  Pt will be able utilize RICE for home, self care and pain mgmt.    Status  Achieved      PT SHORT TERM GOAL #3   Title  Pt will be able to perform AAROM flexion and abduction to 110 deg in supine to work towards full function.    Status  Achieved      PT SHORT TERM GOAL #4   Title  Pt will wean out of sling as able per MD for >75% of the time    Baseline  out for 2 weeks    Status  Achieved        PT Long Term Goals - 05/07/19 1302      PT LONG TERM GOAL #1   Title  Pt will score < 40% impaired on FOTO to demonstrate functional  mobility and use of dominant arm.    Status  On-going      PT LONG TERM GOAL #2   Title  Pt will be able to show 4+/5 strength in L UE or more for maximal function.    Status  On-going      PT LONG TERM GOAL #3   Title  Pt will complete ADLs without lasting increased pain in L UE, dressing and overhead    Status  On-going      PT LONG TERM GOAL #4   Title  Pt will be able to safely lift bowl, pot/pan with LUE for cooking, baking and use L UE to stir.    Status  On-going      PT LONG TERM GOAL #5   Title  Pt will be I with final HEP for L UE ROM and strengthening    Status  On-going            Plan - 05/19/19 0921    Clinical Impression Statement  Declined modalities to get back home to assist spouse. He is limited with ROM and strength but makes good effort.  Continue strength and ROm  per protocol. He repoorts LBP post fusion he will need PT for in future.    Examination-Activity Limitations  Bathing;Reach Overhead;Dressing;Bed Mobility;Hygiene/Grooming;Toileting;Lift;Sleep;Carry    PT Treatment/Interventions  ADLs/Self Care Home Management;Cryotherapy;Electrical Stimulation;Moist Heat;Ultrasound;Functional mobility training;Manual techniques;Passive range of motion;Vasopneumatic Device;Taping;Therapeutic exercise;Iontophoresis 4mg /ml Dexamethasone;Therapeutic activities;Neuromuscular re-education;Patient/family education    PT Next Visit Plan  6 weeks, cont ROM, strength , manual as needed , scap Rhythmic stab, 1 lbs weights    PT Home Exercise Plan  table slides, pendulums, isometrics, cane flexion OK.  red band for ER    Consulted and Agree with Plan of Care  Patient       Patient will benefit from skilled therapeutic intervention in order to improve the following deficits and  impairments:  Decreased range of motion, Decreased strength, Hypomobility, Decreased mobility, Decreased balance, Obesity, Postural dysfunction, Impaired flexibility, Impaired UE functional use,  Pain  Visit Diagnosis: S/P reverse total shoulder arthroplasty, left  Acute pain of left shoulder  Muscle weakness (generalized)  Other symptoms and signs involving the musculoskeletal system     Problem List Patient Active Problem List   Diagnosis Date Noted  . 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 Lumbar stenosis with neurogenic claudication 10/28/2018  . DM type 2 (diabetes mellitus, type 2) ([place]) 01/15/2012  . CAD (coronary artery disease) 01/15/2012  . HTN (hypertension) 01/15/2012  . Dyslipidemia 01/15/2012  . COPD (chronic obstructive pulmonary disease); chronic bronchitis 01/15/2012  . Tobacco abuse 01/15/2012  . BMI 40.0-44.9, adult ([place]) 01/15/2012  . OSA (obstructive sleep apnea) 01/15/2012  . Insomnia 01/15/2012  . IBS (irritable bowel syndrome) 01/15/2012  . Hypogonadism male 01/15/2012  . DJD (degenerative joint disease) 01/15/2012  . Rotator cuff arthropathy, left 01/15/2012  . GERD (gastroesophageal reflux disease) 01/15/2012  . CHEST PAIN, ATYPICAL 07/12/2010  . TOBACCO USER 10/20/2009  . CAD, NATIVE VESSEL 09/29/2009  . EMPHYSEMA 09/21/2009  . DM 08/31/2009  . HYPERLIPIDEMIA 08/31/2009  . OBSTRUCTIVE SLEEP APNEA 08/31/2009  . Essential hypertension 08/31/2009  . CHRONIC OBSTRUCTIVE PULMONARY DISEASE, ACUTE EXACERBATION 08/31/2009  . DYSPNEA 08/31/2009    Darrel Hoover PT 05/19/2019, 9:56 AM  [facility] [address] [place], [name], [address] Phone: [phone]   Fax:  [phone]  Name: Justin Trujillo MRN: [id number] Date of Birth: 1946/05/25

## 2019-05-26 ENCOUNTER — Ambulatory Visit: Payer: PPO

## 2019-05-29 ENCOUNTER — Encounter

## 2019-06-01 ENCOUNTER — Other Ambulatory Visit: Payer: Self-pay

## 2019-06-01 ENCOUNTER — Ambulatory Visit: Payer: PPO

## 2019-06-01 DIAGNOSIS — R29898 Other symptoms and signs involving the musculoskeletal system: Secondary | ICD-10-CM

## 2019-06-01 DIAGNOSIS — Z96612 Presence of left artificial shoulder joint: Secondary | ICD-10-CM

## 2019-06-01 DIAGNOSIS — M25512 Pain in left shoulder: Secondary | ICD-10-CM

## 2019-06-01 DIAGNOSIS — M6281 Muscle weakness (generalized): Secondary | ICD-10-CM

## 2019-06-01 NOTE — Therapy (Signed)
[place], [name], [address] Phone: [phone]   Fax:  [phone]  Physical Therapy Treatment  Patient Details  Name: Justin Trujillo MRN: [id number] Date of Birth: Jan 26, 1946 Referring Provider (PT): Dr. Justice Trujillo    Encounter Date: 06/01/2019  PT End of Session - 06/01/19 0915    Visit Number  9    Number of Visits  18    Date for PT Re-Evaluation  07/02/19    Authorization Type  [employer]    PT Start Time  0915    PT Stop Time  1000    PT Time Calculation (min)  45 min    Activity Tolerance  Patient tolerated treatment well    Behavior During Therapy  Justin Trujillo for tasks assessed/performed       Past Medical History:  Diagnosis Date  . Arthritis   . Coronary atherosclerosis of native coronary artery   . Hypogonadism male   . Obstructive chronic bronchitis with exacerbation ([place])   . Obstructive sleep apnea (adult) (pediatric)    no cpap  . Other and unspecified hyperlipidemia   . Other emphysema ([place])   . Proteinuria 2019   has seen a nephrologist  . Shortness of breath    with excertion  . Type II or unspecified type diabetes mellitus without mention of complication, not stated as uncontrolled    type 2  . Unspecified essential hypertension     Past Surgical History:  Procedure Laterality Date  . CARDIAC CATHETERIZATION    . IR KYPHO LUMBAR INC FX REDUCE BONE BX UNI/BIL CANNULATION INC/IMAGING  06/13/2018  . L lens implant    . R knee replacement x2    . REVERSE SHOULDER ARTHROPLASTY Left 03/26/2019   Procedure: REVERSE SHOULDER ARTHROPLASTY;  Surgeon: Justin Britain, MD;  Location: [facility];  Service: Orthopedics;  Laterality: Left;  164min  . TONSILLECTOMY    . TONSILLECTOMY AND ADENOIDECTOMY    . vascectomy      There were no vitals filed for this visit.  Subjective Assessment - 06/01/19 0925    Subjective  Weaker than earlier today.    Pain Score  3     Pain Location   Shoulder    Pain Orientation  Left    Pain Descriptors / Indicators  Sore    Pain Type  Surgical pain    Pain Onset  More than a month ago    Pain Frequency  Intermittent    Aggravating Factors   use LT arm    Pain Relieving Factors  cold rest         [facility] PT Assessment - 06/01/19 0001      AROM   Left Shoulder Flexion  65 Degrees   in saggital plane, in scaption 90 sitting   Left Shoulder ABduction  80 Degrees    Left Shoulder Internal Rotation  55 Degrees   elbow 90 degrees at side   Left Shoulder External Rotation  42 Degrees      PROM   Left Shoulder Flexion  130 Degrees    Left Shoulder ABduction  108 Degrees                   [facility] Adult PT Treatment/Exercise - 06/01/19 0001      Shoulder Exercises: Seated   External Rotation  AROM;Left;20 reps    Flexion  AAROM;Left;20 reps    Flexion Limitations  2 sets    Abduction  AAROM;Left;20  reps    ABduction Limitations  2 sets    Other Seated Exercises  sometrics all planes 6-8 sec hold with ER  giving the least resistance.       Shoulder Exercises: Pulleys   Flexion  3 minutes               PT Short Term Goals - 05/04/19 0848      PT SHORT TERM GOAL #1   Title  Pt will be I with initial HEP    Status  Achieved      PT SHORT TERM GOAL #2   Title  Pt will be able utilize RICE for home, self care and pain mgmt.    Status  Achieved      PT SHORT TERM GOAL #3   Title  Pt will be able to perform AAROM flexion and abduction to 110 deg in supine to work towards full function.    Status  Achieved      PT SHORT TERM GOAL #4   Title  Pt will wean out of sling as able per MD for >75% of the time    Baseline  out for 2 weeks    Status  Achieved        PT Long Term Goals - 06/01/19 [id number]      PT LONG TERM GOAL #2   Title  Pt will be able to show 4+/5 strength in L UE or more for maximal function.    Baseline  3- flexion and abduction    Time  8    Period  Weeks    Status  On-going      PT  LONG TERM GOAL #3   Title  Pt will complete ADLs without lasting increased pain in L UE, dressing and overhead    Status  On-going      PT LONG TERM GOAL #4   Title  Pt will be able to safely lift bowl, pot/pan with LUE for cooking, baking and use L UE to stir.    Status  On-going      PT LONG TERM GOAL #5   Title  Pt will be I with final HEP for L UE ROM and strengthening    Status  On-going            Plan - 06/01/19 0915    Clinical Impression Statement  Justin Trujillo is improving but wakness continues . He would benefit from extension opf Pt to improve strenght for functional use of Lt arm    PT Treatment/Interventions  ADLs/Self Care Home Management;Cryotherapy;Electrical Stimulation;Moist Heat;Ultrasound;Functional mobility training;Manual techniques;Passive range of motion;Vasopneumatic Device;Taping;Therapeutic exercise;Iontophoresis 4mg /ml Dexamethasone;Therapeutic activities;Neuromuscular re-education;Patient/family education    PT Next Visit Plan  , cont ROM, strength , manual as needed , scap Rhythmic stab, 1 lbs weights                 FOTO    PT Home Exercise Plan  table slides, pendulums, isometrics, cane flexion OK.  red band for ER    Consulted and Agree with Plan of Care  Patient       Patient will benefit from skilled therapeutic intervention in order to improve the following deficits and impairments:  Decreased range of motion, Decreased strength, Hypomobility, Decreased mobility, Decreased balance, Obesity, Postural dysfunction, Impaired flexibility, Impaired UE functional use, Pain  Visit Diagnosis: S/P reverse total shoulder arthroplasty, left - Plan: PT plan of care cert/re-cert  Acute pain of left shoulder - Plan: PT  plan of care cert/re-cert  Muscle weakness (generalized) - Plan: PT plan of care cert/re-cert  Other symptoms and signs involving the musculoskeletal system - Plan: PT plan of care cert/re-cert     Problem List Patient Active Problem List    Diagnosis Date Noted  . 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 Lumbar stenosis with neurogenic claudication 10/28/2018  . DM type 2 (diabetes mellitus, type 2) ([place]) 01/15/2012  . CAD (coronary artery disease) 01/15/2012  . HTN (hypertension) 01/15/2012  . Dyslipidemia 01/15/2012  . COPD (chronic obstructive pulmonary disease); chronic bronchitis 01/15/2012  . Tobacco abuse 01/15/2012  . BMI 40.0-44.9, adult ([place]) 01/15/2012  . OSA (obstructive sleep apnea) 01/15/2012  . Insomnia 01/15/2012  . IBS (irritable bowel syndrome) 01/15/2012  . Hypogonadism male 01/15/2012  . DJD (degenerative joint disease) 01/15/2012  . Rotator cuff arthropathy, left 01/15/2012  . GERD (gastroesophageal reflux disease) 01/15/2012  . CHEST PAIN, ATYPICAL 07/12/2010  . TOBACCO USER 10/20/2009  . CAD, NATIVE VESSEL 09/29/2009  . EMPHYSEMA 09/21/2009  . DM 08/31/2009  . HYPERLIPIDEMIA 08/31/2009  . OBSTRUCTIVE SLEEP APNEA 08/31/2009  . Essential hypertension 08/31/2009  . CHRONIC OBSTRUCTIVE PULMONARY DISEASE, ACUTE EXACERBATION 08/31/2009  . DYSPNEA 08/31/2009    Darrel Hoover 06/01/2019, 10:01 AM  PT  [facility] [address] [place], [name], [address] Phone: [phone]   Fax:  [phone]  Name: Justin Trujillo MRN: [id number] Date of Birth: 1946-08-05

## 2019-06-04 ENCOUNTER — Other Ambulatory Visit: Payer: Self-pay

## 2019-06-04 ENCOUNTER — Ambulatory Visit: Payer: PPO

## 2019-06-04 DIAGNOSIS — M25512 Pain in left shoulder: Secondary | ICD-10-CM

## 2019-06-04 DIAGNOSIS — Z96612 Presence of left artificial shoulder joint: Secondary | ICD-10-CM | POA: Diagnosis not present

## 2019-06-04 DIAGNOSIS — R29898 Other symptoms and signs involving the musculoskeletal system: Secondary | ICD-10-CM

## 2019-06-04 DIAGNOSIS — M6281 Muscle weakness (generalized): Secondary | ICD-10-CM

## 2019-06-04 NOTE — Therapy (Signed)
[place], [name], [address] Phone: [phone]   Fax:  [phone]  Physical Therapy Treatment  Patient Details  Name: Justin Trujillo MRN: [id number] Date of Birth: September 20, 1945 Referring Provider (PT): Dr. Justice Britain   Progress Note Reporting Period 04/08/19 to 06/04/19  See note below for Objective Data and Assessment of Progress/Goals.      Encounter Date: 06/04/2019  PT End of Session - 06/04/19 0959    Visit Number  10    Number of Visits  18    Date for PT Re-Evaluation  07/02/19    Authorization Type  [employer]    PT Start Time  1000    PT Stop Time  1045    PT Time Calculation (min)  45 min    Activity Tolerance  Patient tolerated treatment well    Behavior During Therapy  WFL for tasks assessed/performed       Past Medical History:  Diagnosis Date  . Arthritis   . Coronary atherosclerosis of native coronary artery   . Hypogonadism male   . Obstructive chronic bronchitis with exacerbation ([place])   . Obstructive sleep apnea (adult) (pediatric)    no cpap  . Other and unspecified hyperlipidemia   . Other emphysema ([place])   . Proteinuria 2019   has seen a nephrologist  . Shortness of breath    with excertion  . Type II or unspecified type diabetes mellitus without mention of complication, not stated as uncontrolled    type 2  . Unspecified essential hypertension     Past Surgical History:  Procedure Laterality Date  . CARDIAC CATHETERIZATION    . IR KYPHO LUMBAR INC FX REDUCE BONE BX UNI/BIL CANNULATION INC/IMAGING  06/13/2018  . L lens implant    . R knee replacement x2    . REVERSE SHOULDER ARTHROPLASTY Left 03/26/2019   Procedure: REVERSE SHOULDER ARTHROPLASTY;  Surgeon: Justice Britain, MD;  Location: [facility];  Service: Orthopedics;  Laterality: Left;  15min  . TONSILLECTOMY    . TONSILLECTOMY AND ADENOIDECTOMY    . vascectomy      There were no vitals filed for  this visit.  Subjective Assessment - 06/04/19 1041    Subjective  Last session helped the most so far.    Pain Score  3     Pain Location  Shoulder    Pain Orientation  Left                       [facility] Adult PT Treatment/Exercise - 06/04/19 0001      Shoulder Exercises: Seated   External Rotation  AROM;Left;20 reps    Flexion  AAROM;Left;20 reps    Flexion Limitations  2 sets    Abduction  AAROM;Left;20 reps    ABduction Limitations  2 sets    Other Seated Exercises  isometrics all planes 6-8 sec hold with ER  giving the least resistance.     Other Seated Exercises  reaching overhead with sustaining end range and short arcs at end range.       Shoulder Exercises: Pulleys   Flexion  3 minutes    Scaption  2 minutes      Manual Therapy   Manual Therapy  Soft tissue mobilization    Soft tissue mobilization  pectorla and upper 1/4 LT             PT Education - 06/04/19 1042    Education  Details  Need to keep rotation planes clean by decr effoet so elbow flex /ext does not occur    Person(s) Educated  Patient    Methods  Explanation    Comprehension  Verbalized understanding       PT Short Term Goals - 05/04/19 0848      PT SHORT TERM GOAL #1   Title  Pt will be I with initial HEP    Status  Achieved      PT SHORT TERM GOAL #2   Title  Pt will be able utilize RICE for home, self care and pain mgmt.    Status  Achieved      PT SHORT TERM GOAL #3   Title  Pt will be able to perform AAROM flexion and abduction to 110 deg in supine to work towards full function.    Status  Achieved      PT SHORT TERM GOAL #4   Title  Pt will wean out of sling as able per MD for >75% of the time    Baseline  out for 2 weeks    Status  Achieved        PT Long Term Goals - 06/01/19 [id number]      PT LONG TERM GOAL #2   Title  Pt will be able to show 4+/5 strength in L UE or more for maximal function.    Baseline  3- flexion and abduction    Time  8    Period   Weeks    Status  On-going      PT LONG TERM GOAL #3   Title  Pt will complete ADLs without lasting increased pain in L UE, dressing and overhead    Status  On-going      PT LONG TERM GOAL #4   Title  Pt will be able to safely lift bowl, pot/pan with LUE for cooking, baking and use L UE to stir.    Status  On-going      PT LONG TERM GOAL #5   Title  Pt will be I with final HEP for L UE ROM and strengthening    Status  On-going            Plan - 06/04/19 1042    Clinical Impression Statement  he ws able to lift arm overhead independently. Need sto keep effort and directions more clean and he appeared to understand this. Woild benefit for continued PT    Examination-Activity Limitations  Bathing;Reach Overhead;Dressing;Bed Mobility;Hygiene/Grooming;Toileting;Lift;Sleep;Carry    PT Treatment/Interventions  ADLs/Self Care Home Management;Cryotherapy;Electrical Stimulation;Moist Heat;Ultrasound;Functional mobility training;Manual techniques;Passive range of motion;Vasopneumatic Device;Taping;Therapeutic exercise;Iontophoresis 4mg /ml Dexamethasone;Therapeutic activities;Neuromuscular re-education;Patient/family education    PT Next Visit Plan  , cont ROM, strength , manual as needed , scap Rhythmic stab, 1 lbs weights                 FOTO    PT Home Exercise Plan  table slides, pendulums, isometrics, cane flexion OK.  red band for ER    Consulted and Agree with Plan of Care  Patient       Patient will benefit from skilled therapeutic intervention in order to improve the following deficits and impairments:  Decreased range of motion, Decreased strength, Hypomobility, Decreased mobility, Decreased balance, Obesity, Postural dysfunction, Impaired flexibility, Impaired UE functional use, Pain  Visit Diagnosis: Acute pain of left shoulder  Muscle weakness (generalized)  Other symptoms and signs involving the musculoskeletal system  S/P reverse total shoulder arthroplasty,  left     Problem List Patient Active Problem List   Diagnosis Date Noted  . 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 Lumbar stenosis with neurogenic claudication 10/28/2018  . DM type 2 (diabetes mellitus, type 2) ([place]) 01/15/2012  . CAD (coronary artery disease) 01/15/2012  . HTN (hypertension) 01/15/2012  . Dyslipidemia 01/15/2012  . COPD (chronic obstructive pulmonary disease); chronic bronchitis 01/15/2012  . Tobacco abuse 01/15/2012  . BMI 40.0-44.9, adult ([place]) 01/15/2012  . OSA (obstructive sleep apnea) 01/15/2012  . Insomnia 01/15/2012  . IBS (irritable bowel syndrome) 01/15/2012  . Hypogonadism male 01/15/2012  . DJD (degenerative joint disease) 01/15/2012  . Rotator cuff arthropathy, left 01/15/2012  . GERD (gastroesophageal reflux disease) 01/15/2012  . CHEST PAIN, ATYPICAL 07/12/2010  . TOBACCO USER 10/20/2009  . CAD, NATIVE VESSEL 09/29/2009  . EMPHYSEMA 09/21/2009  . DM 08/31/2009  . HYPERLIPIDEMIA 08/31/2009  . OBSTRUCTIVE SLEEP APNEA 08/31/2009  . Essential hypertension 08/31/2009  . CHRONIC OBSTRUCTIVE PULMONARY DISEASE, ACUTE EXACERBATION 08/31/2009  . DYSPNEA 08/31/2009    Darrel Hoover  PT 06/04/2019, 10:46 AM  [facility] [address] [place], [name], [address] Phone: [phone]   Fax:  [phone]  Name: Justin Trujillo MRN: [id number] Date of Birth: 02/11/1946

## 2019-06-08 ENCOUNTER — Other Ambulatory Visit: Payer: Self-pay

## 2019-06-08 ENCOUNTER — Ambulatory Visit: Payer: PPO

## 2019-06-08 DIAGNOSIS — Z96612 Presence of left artificial shoulder joint: Secondary | ICD-10-CM | POA: Diagnosis not present

## 2019-06-08 DIAGNOSIS — M25512 Pain in left shoulder: Secondary | ICD-10-CM

## 2019-06-08 DIAGNOSIS — R29898 Other symptoms and signs involving the musculoskeletal system: Secondary | ICD-10-CM

## 2019-06-08 DIAGNOSIS — M6281 Muscle weakness (generalized): Secondary | ICD-10-CM

## 2019-06-08 NOTE — Therapy (Signed)
[place], [name], [address] Phone: [phone]   Fax:  [phone]  Physical Therapy Treatment  Patient Details  Name: Justin Trujillo MRN: [id number] Date of Birth: March 26, 1946 Referring Provider (PT): Dr. Justice Britain    Encounter Date: 06/08/2019  PT End of Session - 06/08/19 0916    Visit Number  11    Number of Visits  18    Date for PT Re-Evaluation  07/02/19    PT Start Time  0915    PT Stop Time  0955    PT Time Calculation (min)  40 min    Activity Tolerance  Patient tolerated treatment well;No increased pain    Behavior During Therapy  WFL for tasks assessed/performed       Past Medical History:  Diagnosis Date  . Arthritis   . Coronary atherosclerosis of native coronary artery   . Hypogonadism male   . Obstructive chronic bronchitis with exacerbation ([place])   . Obstructive sleep apnea (adult) (pediatric)    no cpap  . Other and unspecified hyperlipidemia   . Other emphysema ([place])   . Proteinuria 2019   has seen a nephrologist  . Shortness of breath    with excertion  . Type II or unspecified type diabetes mellitus without mention of complication, not stated as uncontrolled    type 2  . Unspecified essential hypertension     Past Surgical History:  Procedure Laterality Date  . CARDIAC CATHETERIZATION    . IR KYPHO LUMBAR INC FX REDUCE BONE BX UNI/BIL CANNULATION INC/IMAGING  06/13/2018  . L lens implant    . R knee replacement x2    . REVERSE SHOULDER ARTHROPLASTY Left 03/26/2019   Procedure: REVERSE SHOULDER ARTHROPLASTY;  Surgeon: Justice Britain, MD;  Location: [facility];  Service: Orthopedics;  Laterality: Left;  168min  . TONSILLECTOMY    . TONSILLECTOMY AND ADENOIDECTOMY    . vascectomy      There were no vitals filed for this visit.  Subjective Assessment - 06/08/19 0919    Subjective  Bad couple of days doing yard work and dog pulled Lt arm and slept on LT side  all causing  increased pain. in shoulder . Also LBP  incr    Pain Score  4     Pain Location  Shoulder    Pain Orientation  Left    Pain Descriptors / Indicators  Sore    Pain Type  Surgical pain;Acute pain    Pain Onset  More than a month ago    Pain Frequency  Constant         [facility] PT Assessment - 06/08/19 0001      Observation/Other Assessments   Focus on Therapeutic Outcomes (FOTO)   45% limited improved                   [facility] Adult PT Treatment/Exercise - 06/08/19 0001      Shoulder Exercises: Seated   External Rotation  AROM;Left;20 reps    Flexion  AAROM;Left;20 reps    Abduction  AAROM;Left;20 reps    ABduction Limitations  to 90 degrees    Other Seated Exercises  isometrics all planes 6-8 sec hold with ER  giving the least resistance.     Other Seated Exercises  reaching overhead with sustaining end range and short arcs at end range.       Shoulder Exercises: Pulleys   Flexion  3 minutes  Scaption  2 minutes      Manual Therapy   Soft tissue mobilization  pectorla and upper 1/4 LT             PT Education - 06/08/19 0955    Education Details  ice and heat 2-3x/day and avoid any activity that causes any significant incr pain.    Person(s) Educated  Patient    Methods  Explanation    Comprehension  Verbalized understanding       PT Short Term Goals - 05/04/19 0848      PT SHORT TERM GOAL #1   Title  Pt will be I with initial HEP    Status  Achieved      PT SHORT TERM GOAL #2   Title  Pt will be able utilize RICE for home, self care and pain mgmt.    Status  Achieved      PT SHORT TERM GOAL #3   Title  Pt will be able to perform AAROM flexion and abduction to 110 deg in supine to work towards full function.    Status  Achieved      PT SHORT TERM GOAL #4   Title  Pt will wean out of sling as able per MD for >75% of the time    Baseline  out for 2 weeks    Status  Achieved        PT Long Term Goals - 06/08/19 0926      PT LONG TERM  GOAL #1   Title  Pt will score < 40% impaired on FOTO to demonstrate functional mobility and use of dominant arm.    Baseline  45% limited  significant improvement    Status  On-going            Plan - 06/08/19 0916    Clinical Impression Statement  Even with incr pain FOTO improved to 45% limited. icreased LKT shoulder pain from activity over weekend. Asked him to rest arm and we were easy with arm today Will be careful this week to not aggravate pain  and hope incr pain witll calm down.    Examination-Participation Restrictions  Driving;Yard Work;Laundry;Meal Prep;Cleaning    PT Treatment/Interventions  ADLs/Self Care Home Management;Cryotherapy;Electrical Stimulation;Moist Heat;Ultrasound;Functional mobility training;Manual techniques;Passive range of motion;Vasopneumatic Device;Taping;Therapeutic exercise;Iontophoresis 4mg /ml Dexamethasone;Therapeutic activities;Neuromuscular re-education;Patient/family education    PT Next Visit Plan  , cont ROM, strength , manual as needed , scap Rhythmic stab, 1 lbs weights             Easy for this week  no incr pain.    PT Home Exercise Plan  table slides, pendulums, isometrics, cane flexion OK.  red band for ER    Consulted and Agree with Plan of Care  Patient       Patient will benefit from skilled therapeutic intervention in order to improve the following deficits and impairments:  Decreased range of motion, Decreased strength, Hypomobility, Decreased mobility, Decreased balance, Obesity, Postural dysfunction, Impaired flexibility, Impaired UE functional use, Pain  Visit Diagnosis: Acute pain of left shoulder  Muscle weakness (generalized)  Other symptoms and signs involving the musculoskeletal system  S/P reverse total shoulder arthroplasty, left     Problem List Patient Active Problem List   Diagnosis Date Noted  . 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 Lumbar stenosis with neurogenic claudication 10/28/2018   . DM type 2 (diabetes mellitus, type 2) ([place]) 01/15/2012  . CAD (coronary artery disease) 01/15/2012  .  HTN (hypertension) 01/15/2012  . Dyslipidemia 01/15/2012  . COPD (chronic obstructive pulmonary disease); chronic bronchitis 01/15/2012  . Tobacco abuse 01/15/2012  . BMI 40.0-44.9, adult ([place]) 01/15/2012  . OSA (obstructive sleep apnea) 01/15/2012  . Insomnia 01/15/2012  . IBS (irritable bowel syndrome) 01/15/2012  . Hypogonadism male 01/15/2012  . DJD (degenerative joint disease) 01/15/2012  . Rotator cuff arthropathy, left 01/15/2012  . GERD (gastroesophageal reflux disease) 01/15/2012  . CHEST PAIN, ATYPICAL 07/12/2010  . TOBACCO USER 10/20/2009  . CAD, NATIVE VESSEL 09/29/2009  . EMPHYSEMA 09/21/2009  . DM 08/31/2009  . HYPERLIPIDEMIA 08/31/2009  . OBSTRUCTIVE SLEEP APNEA 08/31/2009  . Essential hypertension 08/31/2009  . CHRONIC OBSTRUCTIVE PULMONARY DISEASE, ACUTE EXACERBATION 08/31/2009  . DYSPNEA 08/31/2009    Darrel Hoover  PT 06/08/2019, 9:58 AM  [facility] [address] [place], [name], [address] Phone: [phone]   Fax:  [phone]  Name: JAHMARI SAMEC MRN: [id number] Date of Birth: 10/12/1945

## 2019-06-10 DIAGNOSIS — M549 Dorsalgia, unspecified: Secondary | ICD-10-CM | POA: Diagnosis not present

## 2019-06-11 ENCOUNTER — Ambulatory Visit: Payer: PPO

## 2019-06-11 ENCOUNTER — Other Ambulatory Visit: Payer: Self-pay

## 2019-06-11 DIAGNOSIS — Z96612 Presence of left artificial shoulder joint: Secondary | ICD-10-CM

## 2019-06-11 DIAGNOSIS — M25512 Pain in left shoulder: Secondary | ICD-10-CM

## 2019-06-11 DIAGNOSIS — M6281 Muscle weakness (generalized): Secondary | ICD-10-CM

## 2019-06-11 DIAGNOSIS — R29898 Other symptoms and signs involving the musculoskeletal system: Secondary | ICD-10-CM

## 2019-06-11 NOTE — Therapy (Signed)
[place], [name], [address] Phone: [phone]   Fax:  [phone]  Physical Therapy Treatment  Patient Details  Name: Justin Trujillo MRN: [id number] Date of Birth: May 20, 1946 Referring Provider (PT): Dr. Justice Britain    Encounter Date: 06/11/2019  PT End of Session - 06/11/19 1049    Visit Number  12    Number of Visits  22    Date for PT Re-Evaluation  08/07/19    Authorization Type  [employer]    PT Start Time  1048    PT Stop Time  1130    PT Time Calculation (min)  42 min    Activity Tolerance  Patient tolerated treatment well;No increased pain    Behavior During Therapy  WFL for tasks assessed/performed       Past Medical History:  Diagnosis Date  . Arthritis   . Coronary atherosclerosis of native coronary artery   . Hypogonadism male   . Obstructive chronic bronchitis with exacerbation ([place])   . Obstructive sleep apnea (adult) (pediatric)    no cpap  . Other and unspecified hyperlipidemia   . Other emphysema ([place])   . Proteinuria 2019   has seen a nephrologist  . Shortness of breath    with excertion  . Type II or unspecified type diabetes mellitus without mention of complication, not stated as uncontrolled    type 2  . Unspecified essential hypertension     Past Surgical History:  Procedure Laterality Date  . CARDIAC CATHETERIZATION    . IR KYPHO LUMBAR INC FX REDUCE BONE BX UNI/BIL CANNULATION INC/IMAGING  06/13/2018  . L lens implant    . R knee replacement x2    . REVERSE SHOULDER ARTHROPLASTY Left 03/26/2019   Procedure: REVERSE SHOULDER ARTHROPLASTY;  Surgeon: Justice Britain, MD;  Location: [facility];  Service: Orthopedics;  Laterality: Left;  148min  . TONSILLECTOMY    . TONSILLECTOMY AND ADENOIDECTOMY    . vascectomy      There were no vitals filed for this visit.  Subjective Assessment - 06/11/19 1131    Subjective  Much better than the other day. Myback is an  issue and Md may do anotherCT scan to assess bone healing with fusion.    Pain Score  2     Pain Location  Shoulder    Pain Orientation  Left    Pain Descriptors / Indicators  Sore    Pain Type  Surgical pain;Acute pain    Pain Onset  More than a month ago    Pain Frequency  Constant    Aggravating Factors   using LT arm         [facility] PT Assessment - 06/11/19 0001      AROM   Left Shoulder Flexion  125 Degrees    Left Shoulder ABduction  130 Degrees    Left Shoulder Internal Rotation  60 Degrees    Left Shoulder External Rotation  55 Degrees   arm suppported at 90 degrees abducted     Strength   Right/Left Shoulder  Left    Left Shoulder Flexion  3+/5    Left Shoulder Extension  5/5    Left Shoulder ABduction  3/5    Left Shoulder Internal Rotation  4/5    Left Shoulder External Rotation  4-/5                   [facility] Adult PT Treatment/Exercise - 06/11/19 0001  Shoulder Exercises: Seated   External Rotation  AROM;Left;20 reps    Theraband Level (Shoulder External Rotation)  Level 1 (Yellow)    Internal Rotation  Left;20 reps    Theraband Level (Shoulder Internal Rotation)  Level 1 (Yellow)    Flexion  AROM;Right;15 reps    Theraband Level (Shoulder Flexion)  Level 1 (Yellow)    Flexion Limitations  punching motion    Abduction  Left;20 reps;AROM    ABduction Limitations  to 90 degrees      Shoulder Exercises: Pulleys   Flexion  3 minutes    Scaption  3 minutes      Shoulder Exercises: ROM/Strengthening   Ranger  20 reps overhead and 10 rep each side of V               PT Short Term Goals - 05/04/19 0848      PT SHORT TERM GOAL #1   Title  Pt will be I with initial HEP    Status  Achieved      PT SHORT TERM GOAL #2   Title  Pt will be able utilize RICE for home, self care and pain mgmt.    Status  Achieved      PT SHORT TERM GOAL #3   Title  Pt will be able to perform AAROM flexion and abduction to 110 deg in supine to work  towards full function.    Status  Achieved      PT SHORT TERM GOAL #4   Title  Pt will wean out of sling as able per MD for >75% of the time    Baseline  out for 2 weeks    Status  Achieved        PT Long Term Goals - 06/11/19 1128      PT LONG TERM GOAL #1   Title  Pt will score < 40% impaired on FOTO to demonstrate functional mobility and use of dominant arm.    Status  Unable to assess      PT LONG TERM GOAL #2   Title  Pt will be able to show 4+/5 strength in L UE or more for maximal function.    Baseline  3/5 abduction    Status  On-going      PT LONG TERM GOAL #3   Title  Pt will complete ADLs without lasting increased pain in L UE, dressing and overhead    Baseline  limited over shoulder height with weight    Status  On-going      PT LONG TERM GOAL #4   Title  Pt will be able to safely lift bowl, pot/pan with LUE for cooking, baking and use L UE to stir.    Status  On-going      PT LONG TERM GOAL #5   Title  Pt will be I with final HEP for L UE ROM and strengthening    Status  On-going            Plan - 06/11/19 1116    Clinical Impression Statement  Improved AROM and tolerated light resistance .  He appears ready to cafefully work on strength with reisitance    PT Treatment/Interventions  ADLs/Self Care Home Management;Cryotherapy;Electrical Stimulation;Moist Heat;Ultrasound;Functional mobility training;Manual techniques;Passive range of motion;Vasopneumatic Device;Taping;Therapeutic exercise;Iontophoresis 4mg /ml Dexamethasone;Therapeutic activities;Neuromuscular re-education;Patient/family education    PT Next Visit Plan  , cont ROM, strength , manual as needed , scap Rhythmic stab, 1 lbs weights  Progress strength per Dr Susie Cassette restrictions    PT Home Exercise Plan  table slides, pendulums, isometrics, cane flexion OK.  red band for ER    Consulted and Agree with Plan of Care  Patient       Patient will benefit from skilled therapeutic  intervention in order to improve the following deficits and impairments:  Decreased range of motion, Decreased strength, Hypomobility, Decreased mobility, Decreased balance, Obesity, Postural dysfunction, Impaired flexibility, Impaired UE functional use, Pain  Visit Diagnosis: Acute pain of left shoulder  Muscle weakness (generalized)  Other symptoms and signs involving the musculoskeletal system  S/P reverse total shoulder arthroplasty, left     Problem List Patient Active Problem List   Diagnosis Date Noted  . 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 Lumbar stenosis with neurogenic claudication 10/28/2018  . DM type 2 (diabetes mellitus, type 2) ([place]) 01/15/2012  . CAD (coronary artery disease) 01/15/2012  . HTN (hypertension) 01/15/2012  . Dyslipidemia 01/15/2012  . COPD (chronic obstructive pulmonary disease); chronic bronchitis 01/15/2012  . Tobacco abuse 01/15/2012  . BMI 40.0-44.9, adult ([place]) 01/15/2012  . OSA (obstructive sleep apnea) 01/15/2012  . Insomnia 01/15/2012  . IBS (irritable bowel syndrome) 01/15/2012  . Hypogonadism male 01/15/2012  . DJD (degenerative joint disease) 01/15/2012  . Rotator cuff arthropathy, left 01/15/2012  . GERD (gastroesophageal reflux disease) 01/15/2012  . CHEST PAIN, ATYPICAL 07/12/2010  . TOBACCO USER 10/20/2009  . CAD, NATIVE VESSEL 09/29/2009  . EMPHYSEMA 09/21/2009  . DM 08/31/2009  . HYPERLIPIDEMIA 08/31/2009  . OBSTRUCTIVE SLEEP APNEA 08/31/2009  . Essential hypertension 08/31/2009  . CHRONIC OBSTRUCTIVE PULMONARY DISEASE, ACUTE EXACERBATION 08/31/2009  . DYSPNEA 08/31/2009    Darrel Hoover  PT 06/11/2019, 11:33 AM  [facility] [address] [place], [name], [address] Phone: [phone]   Fax:  [phone]  Name: JAVAE Trujillo MRN: [id number] Date of Birth: 04-Aug-1946

## 2019-06-15 ENCOUNTER — Other Ambulatory Visit: Payer: Self-pay | Admitting: Neurological Surgery

## 2019-06-15 DIAGNOSIS — Z9889 Other specified postprocedural states: Secondary | ICD-10-CM

## 2019-06-15 DIAGNOSIS — Z96612 Presence of left artificial shoulder joint: Secondary | ICD-10-CM | POA: Diagnosis not present

## 2019-06-15 DIAGNOSIS — Z471 Aftercare following joint replacement surgery: Secondary | ICD-10-CM | POA: Diagnosis not present

## 2019-06-15 DIAGNOSIS — M549 Dorsalgia, unspecified: Secondary | ICD-10-CM

## 2019-06-16 ENCOUNTER — Ambulatory Visit: Payer: PPO

## 2019-06-16 ENCOUNTER — Other Ambulatory Visit: Payer: Self-pay

## 2019-06-16 DIAGNOSIS — Z96612 Presence of left artificial shoulder joint: Secondary | ICD-10-CM | POA: Diagnosis not present

## 2019-06-16 DIAGNOSIS — M6281 Muscle weakness (generalized): Secondary | ICD-10-CM

## 2019-06-16 DIAGNOSIS — R29898 Other symptoms and signs involving the musculoskeletal system: Secondary | ICD-10-CM

## 2019-06-16 NOTE — Therapy (Signed)
[place], [name], [address] Phone: [phone]   Fax:  [phone]  Physical Therapy Treatment  Patient Details  Name: Justin Trujillo MRN: [id number] Date of Birth: Aug 14, 1946 Referring Provider (PT): Dr. Justice Britain    Encounter Date: 06/16/2019  PT End of Session - 06/16/19 1227    Visit Number  13    Number of Visits  22    Authorization Type  [employer]    PT Start Time  [id number]    PT Stop Time  1233    PT Time Calculation (min)  48 min    Activity Tolerance  Patient tolerated treatment well;No increased pain    Behavior During Therapy  WFL for tasks assessed/performed       Past Medical History:  Diagnosis Date  . Arthritis   . Coronary atherosclerosis of native coronary artery   . Hypogonadism male   . Obstructive chronic bronchitis with exacerbation ([place])   . Obstructive sleep apnea (adult) (pediatric)    no cpap  . Other and unspecified hyperlipidemia   . Other emphysema ([place])   . Proteinuria 2019   has seen a nephrologist  . Shortness of breath    with excertion  . Type II or unspecified type diabetes mellitus without mention of complication, not stated as uncontrolled    type 2  . Unspecified essential hypertension     Past Surgical History:  Procedure Laterality Date  . CARDIAC CATHETERIZATION    . IR KYPHO LUMBAR INC FX REDUCE BONE BX UNI/BIL CANNULATION INC/IMAGING  06/13/2018  . L lens implant    . R knee replacement x2    . REVERSE SHOULDER ARTHROPLASTY Left 03/26/2019   Procedure: REVERSE SHOULDER ARTHROPLASTY;  Surgeon: Justice Britain, MD;  Location: [facility];  Service: Orthopedics;  Laterality: Left;  112min  . TONSILLECTOMY    . TONSILLECTOMY AND ADENOIDECTOMY    . vascectomy      There were no vitals filed for this visit.  Subjective Assessment - 06/16/19 1144    Subjective  MD was pleased and released me and I am cleared to do as much as I can comfortably.   Want to be able to stand with hands behind back.  Still has pain when dog bumps into Lt arm.    Pain Score  2     Pain Location  Shoulder                       [facility] Adult PT Treatment/Exercise - 06/16/19 0001      Shoulder Exercises: Standing   Extension  Left;15 reps    Theraband Level (Shoulder Extension)  Level 4 (Blue)    Row  Left;15 reps    Theraband Level (Shoulder Row)  Level 4 (Blue)    Other Standing Exercises  ball on wall x 5 4 sets      Shoulder Exercises: Pulleys   Flexion  3 minutes      Shoulder Exercises: ROM/Strengthening   UBE (Upper Arm Bike)  L1 5 min    Other ROM/Strengthening Exercises  seated bench press 10 pounds x 15. Can overhead press x 15 , biceps curls  x 15 7#         Shoulder Exercises: Stretch   Other Shoulder Stretches  behind backstretch across back with stick x5 20 sec               PT Short Term Goals -  05/04/19 0848      PT SHORT TERM GOAL #1   Title  Pt will be I with initial HEP    Status  Achieved      PT SHORT TERM GOAL #2   Title  Pt will be able utilize RICE for home, self care and pain mgmt.    Status  Achieved      PT SHORT TERM GOAL #3   Title  Pt will be able to perform AAROM flexion and abduction to 110 deg in supine to work towards full function.    Status  Achieved      PT SHORT TERM GOAL #4   Title  Pt will wean out of sling as able per MD for >75% of the time    Baseline  out for 2 weeks    Status  Achieved        PT Long Term Goals - 06/11/19 1128      PT LONG TERM GOAL #1   Title  Pt will score < 40% impaired on FOTO to demonstrate functional mobility and use of dominant arm.    Status  Unable to assess      PT LONG TERM GOAL #2   Title  Pt will be able to show 4+/5 strength in L UE or more for maximal function.    Baseline  3/5 abduction    Status  On-going      PT LONG TERM GOAL #3   Title  Pt will complete ADLs without lasting increased pain in L UE, dressing and overhead     Baseline  limited over shoulder height with weight    Status  On-going      PT LONG TERM GOAL #4   Title  Pt will be able to safely lift bowl, pot/pan with LUE for cooking, baking and use L UE to stir.    Status  On-going      PT LONG TERM GOAL #5   Title  Pt will be I with final HEP for L UE ROM and strengthening    Status  On-going            Plan - 06/16/19 1227    Clinical Impression Statement  Released by MD. Benny Lennert on behindback stretch and active overhead with cand.   Still struggles with stab exer against wall.   PAin generally under control.  Encouraged to finish POc if able    PT Treatment/Interventions  ADLs/Self Care Home Management;Cryotherapy;Electrical Stimulation;Moist Heat;Ultrasound;Functional mobility training;Manual techniques;Passive range of motion;Vasopneumatic Device;Taping;Therapeutic exercise;Iontophoresis 4mg /ml Dexamethasone;Therapeutic activities;Neuromuscular re-education;Patient/family education    PT Next Visit Plan  , cont ROM, strength , manual as needed , scap Rhythmic stab, weights  as tolerated       ROM for behind the back reaching    PT Home Exercise Plan  table slides, pendulums, isometrics, cane flexion OK.  red band for ER,      behind back  stretch    Consulted and Agree with Plan of Care  Patient       Patient will benefit from skilled therapeutic intervention in order to improve the following deficits and impairments:  Decreased range of motion, Decreased strength, Hypomobility, Decreased mobility, Decreased balance, Obesity, Postural dysfunction, Impaired flexibility, Impaired UE functional use, Pain  Visit Diagnosis: Muscle weakness (generalized)  Other symptoms and signs involving the musculoskeletal system  S/P reverse total shoulder arthroplasty, left     Problem List Patient Active Problem List   Diagnosis Date Noted  .  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 Lumbar stenosis with neurogenic claudication  10/28/2018  . DM type 2 (diabetes mellitus, type 2) ([place]) 01/15/2012  . CAD (coronary artery disease) 01/15/2012  . HTN (hypertension) 01/15/2012  . Dyslipidemia 01/15/2012  . COPD (chronic obstructive pulmonary disease); chronic bronchitis 01/15/2012  . Tobacco abuse 01/15/2012  . BMI 40.0-44.9, adult ([place]) 01/15/2012  . OSA (obstructive sleep apnea) 01/15/2012  . Insomnia 01/15/2012  . IBS (irritable bowel syndrome) 01/15/2012  . Hypogonadism male 01/15/2012  . DJD (degenerative joint disease) 01/15/2012  . Rotator cuff arthropathy, left 01/15/2012  . GERD (gastroesophageal reflux disease) 01/15/2012  . CHEST PAIN, ATYPICAL 07/12/2010  . TOBACCO USER 10/20/2009  . CAD, NATIVE VESSEL 09/29/2009  . EMPHYSEMA 09/21/2009  . DM 08/31/2009  . HYPERLIPIDEMIA 08/31/2009  . OBSTRUCTIVE SLEEP APNEA 08/31/2009  . Essential hypertension 08/31/2009  . CHRONIC OBSTRUCTIVE PULMONARY DISEASE, ACUTE EXACERBATION 08/31/2009  . DYSPNEA 08/31/2009    Darrel Hoover  PT 06/16/2019, 12:31 PM  [place] [facility] [address] [place], [name], [address] Phone: [phone]   Fax:  [phone]  Name: Justin Trujillo MRN: [id number] Date of Birth: 22-Jun-1946

## 2019-06-18 ENCOUNTER — Ambulatory Visit: Payer: PPO | Attending: Orthopedic Surgery

## 2019-06-18 ENCOUNTER — Other Ambulatory Visit: Payer: Self-pay

## 2019-06-18 DIAGNOSIS — M25512 Pain in left shoulder: Secondary | ICD-10-CM

## 2019-06-18 DIAGNOSIS — M6281 Muscle weakness (generalized): Secondary | ICD-10-CM | POA: Diagnosis not present

## 2019-06-18 DIAGNOSIS — Z96612 Presence of left artificial shoulder joint: Secondary | ICD-10-CM

## 2019-06-18 DIAGNOSIS — R29898 Other symptoms and signs involving the musculoskeletal system: Secondary | ICD-10-CM | POA: Diagnosis not present

## 2019-06-18 NOTE — Therapy (Signed)
[place], [name], [address] Phone: [phone]   Fax:  [phone]  Physical Therapy Treatment  Patient Details  Name: Justin Trujillo MRN: [id number] Date of Birth: 09/02/1946 Referring Provider (PT): Dr. Justice Britain    Encounter Date: 06/18/2019  PT End of Session - 06/18/19 0954    Visit Number  14    Number of Visits  22    Date for PT Re-Evaluation  08/07/19    Authorization Type  [employer]alt[employer]    PT Start Time  1015    PT Stop Time  1045    PT Time Calculation (min)  30 min    Activity Tolerance  Patient tolerated treatment well;No increased pain    Behavior During Therapy  WFL for tasks assessed/performed       Past Medical History:  Diagnosis Date  . Arthritis   . Coronary atherosclerosis of native coronary artery   . Hypogonadism male   . Obstructive chronic bronchitis with exacerbation ([place])   . Obstructive sleep apnea (adult) (pediatric)    no cpap  . Other and unspecified hyperlipidemia   . Other emphysema ([place])   . Proteinuria 2019   has seen a nephrologist  . Shortness of breath    with excertion  . Type II or unspecified type diabetes mellitus without mention of complication, not stated as uncontrolled    type 2  . Unspecified essential hypertension     Past Surgical History:  Procedure Laterality Date  . CARDIAC CATHETERIZATION    . IR KYPHO LUMBAR INC FX REDUCE BONE BX UNI/BIL CANNULATION INC/IMAGING  06/13/2018  . L lens implant    . R knee replacement x2    . REVERSE SHOULDER ARTHROPLASTY Left 03/26/2019   Procedure: REVERSE SHOULDER ARTHROPLASTY;  Surgeon: Justice Britain, MD;  Location: [facility];  Service: Orthopedics;  Laterality: Left;  12min  . TONSILLECTOMY    . TONSILLECTOMY AND ADENOIDECTOMY    . vascectomy      There were no vitals filed for this visit.  Subjective Assessment - 06/18/19 0951    Subjective  Shoulder is sore due to helping a neighbor who  fell. back RT leg and LT shoulder are sore/painful. Was reaching further behind back.    Patient Stated Goals  Regain strength in LUE    Pain Score  5     Pain Location  Shoulder    Pain Orientation  Left    Pain Descriptors / Indicators  Aching;Sore;Sharp    Pain Type  Acute pain    Pain Onset  Yesterday    Pain Frequency  Constant    Aggravating Factors   moving Lt arm overhead and behind    Pain Relieving Factors  cold rest                       [facility] Adult PT Treatment/Exercise - 06/18/19 0001      Shoulder Exercises: Prone   Other Prone Exercises  over head press x 15 with dowel      Shoulder Exercises: Pulleys   Flexion  5 minutes      Shoulder Exercises: ROM/Strengthening   Ranger  10 reps assisted but stopped due to pain.       Manual Therapy   Soft tissue mobilization  pectoral LT  and upper 1/4 LT             PT Education - 06/18/19 [id number]  Education Details  rest and ice LT shoulder over next 3-4 days  checking how ROM feels each day and not stretch anything that has significant pain    Person(s) Educated  Patient    Methods  Explanation    Comprehension  Verbalized understanding       PT Short Term Goals - 05/04/19 0848      PT SHORT TERM GOAL #1   Title  Pt will be I with initial HEP    Status  Achieved      PT SHORT TERM GOAL #2   Title  Pt will be able utilize RICE for home, self care and pain mgmt.    Status  Achieved      PT SHORT TERM GOAL #3   Title  Pt will be able to perform AAROM flexion and abduction to 110 deg in supine to work towards full function.    Status  Achieved      PT SHORT TERM GOAL #4   Title  Pt will wean out of sling as able per MD for >75% of the time    Baseline  out for 2 weeks    Status  Achieved        PT Long Term Goals - 06/11/19 1128      PT LONG TERM GOAL #1   Title  Pt will score < 40% impaired on FOTO to demonstrate functional mobility and use of dominant arm.    Status  Unable to  assess      PT LONG TERM GOAL #2   Title  Pt will be able to show 4+/5 strength in L UE or more for maximal function.    Baseline  3/5 abduction    Status  On-going      PT LONG TERM GOAL #3   Title  Pt will complete ADLs without lasting increased pain in L UE, dressing and overhead    Baseline  limited over shoulder height with weight    Status  On-going      PT LONG TERM GOAL #4   Title  Pt will be able to safely lift bowl, pot/pan with LUE for cooking, baking and use L UE to stir.    Status  On-going      PT LONG TERM GOAL #5   Title  Pt will be I with final HEP for L UE ROM and strengthening    Status  On-going            Plan - 06/18/19 0918    Clinical Impression Statement  Increased pain with movement from asssitsing neightbor yesterday. He reported feeling better after STW and I suggested he rest his arm trhe next few days    PT Treatment/Interventions  ADLs/Self Care Home Management;Cryotherapy;Electrical Stimulation;Moist Heat;Ultrasound;Functional mobility training;Manual techniques;Passive range of motion;Vasopneumatic Device;Taping;Therapeutic exercise;Iontophoresis 4mg /ml Dexamethasone;Therapeutic activities;Neuromuscular re-education;Patient/family education    PT Next Visit Plan  , cont ROM, strength , manual as needed , scap Rhythmic stab, weights  as tolerated       ROM for behind the back reaching    PT Home Exercise Plan  table slides, pendulums, isometrics, cane flexion OK.  red band for ER,      behind back  stretch    Consulted and Agree with Plan of Care  Patient       Patient will benefit from skilled therapeutic intervention in order to improve the following deficits and impairments:  Decreased range of motion, Decreased strength, Hypomobility, Decreased mobility,  Decreased balance, Obesity, Postural dysfunction, Impaired flexibility, Impaired UE functional use, Pain  Visit Diagnosis: Muscle weakness (generalized)  S/P reverse total shoulder  arthroplasty, left  Acute pain of left shoulder     Problem List Patient Active Problem List   Diagnosis Date Noted  . 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 Lumbar stenosis with neurogenic claudication 10/28/2018  . DM type 2 (diabetes mellitus, type 2) ([place]) 01/15/2012  . CAD (coronary artery disease) 01/15/2012  . HTN (hypertension) 01/15/2012  . Dyslipidemia 01/15/2012  . COPD (chronic obstructive pulmonary disease); chronic bronchitis 01/15/2012  . Tobacco abuse 01/15/2012  . BMI 40.0-44.9, adult ([place]) 01/15/2012  . OSA (obstructive sleep apnea) 01/15/2012  . Insomnia 01/15/2012  . IBS (irritable bowel syndrome) 01/15/2012  . Hypogonadism male 01/15/2012  . DJD (degenerative joint disease) 01/15/2012  . Rotator cuff arthropathy, left 01/15/2012  . GERD (gastroesophageal reflux disease) 01/15/2012  . CHEST PAIN, ATYPICAL 07/12/2010  . TOBACCO USER 10/20/2009  . CAD, NATIVE VESSEL 09/29/2009  . EMPHYSEMA 09/21/2009  . DM 08/31/2009  . HYPERLIPIDEMIA 08/31/2009  . OBSTRUCTIVE SLEEP APNEA 08/31/2009  . Essential hypertension 08/31/2009  . CHRONIC OBSTRUCTIVE PULMONARY DISEASE, ACUTE EXACERBATION 08/31/2009  . DYSPNEA 08/31/2009    Darrel Hoover  PT 06/18/2019, 9:56 AM  [facility] [address] [place], [name], [address] Phone: [phone]   Fax:  [phone]  Name: Justin Trujillo MRN: [id number] Date of Birth: 10/27/1945

## 2019-06-22 ENCOUNTER — Other Ambulatory Visit: Payer: Self-pay | Admitting: Neurological Surgery

## 2019-06-22 ENCOUNTER — Ambulatory Visit: Payer: PPO

## 2019-06-22 ENCOUNTER — Other Ambulatory Visit: Payer: Self-pay

## 2019-06-22 VITALS — HR 107

## 2019-06-22 DIAGNOSIS — R29898 Other symptoms and signs involving the musculoskeletal system: Secondary | ICD-10-CM

## 2019-06-22 DIAGNOSIS — M549 Dorsalgia, unspecified: Secondary | ICD-10-CM

## 2019-06-22 DIAGNOSIS — M6281 Muscle weakness (generalized): Secondary | ICD-10-CM

## 2019-06-22 DIAGNOSIS — Z9889 Other specified postprocedural states: Secondary | ICD-10-CM

## 2019-06-22 DIAGNOSIS — Z96612 Presence of left artificial shoulder joint: Secondary | ICD-10-CM

## 2019-06-22 DIAGNOSIS — M25512 Pain in left shoulder: Secondary | ICD-10-CM

## 2019-06-22 NOTE — Therapy (Signed)
[place], [name], [address] Phone: [phone]   Fax:  [phone]  Physical Therapy Treatment  Patient Details  Name: Justin Trujillo MRN: [id number] Date of Birth: [birth year] Referring Provider (PT): Dr. Justice Britain    Encounter Date: 06/22/2019  PT End of Session - 06/22/19 1047    Visit Number  15    Number of Visits  22    Date for PT Re-Evaluation  08/07/19    Authorization Type  [employer]    PT Start Time  0915    PT Stop Time  1000    PT Time Calculation (min)  45 min    Activity Tolerance  Patient tolerated treatment well    Behavior During Therapy  [facility] for tasks assessed/performed       Past Medical History:  Diagnosis Date  . Arthritis   . Coronary atherosclerosis of native coronary artery   . Hypogonadism male   . Obstructive chronic bronchitis with exacerbation ([place])   . Obstructive sleep apnea (adult) (pediatric)    no cpap  . Other and unspecified hyperlipidemia   . Other emphysema ([place])   . Proteinuria 2019   has seen a nephrologist  . Shortness of breath    with excertion  . Type II or unspecified type diabetes mellitus without mention of complication, not stated as uncontrolled    type 2  . Unspecified essential hypertension     Past Surgical History:  Procedure Laterality Date  . CARDIAC CATHETERIZATION    . IR KYPHO LUMBAR INC FX REDUCE BONE BX UNI/BIL CANNULATION INC/IMAGING  06/13/2018  . L lens implant    . R knee replacement x2    . REVERSE SHOULDER ARTHROPLASTY Left 03/26/2019   Procedure: REVERSE SHOULDER ARTHROPLASTY;  Surgeon: Justice Britain, MD;  Location: [facility];  Service: Orthopedics;  Laterality: Left;  126min  . TONSILLECTOMY    . TONSILLECTOMY AND ADENOIDECTOMY    . vascectomy      Vitals:   06/22/19 0922  Pulse: (!) 107  SpO2: 98%    Subjective Assessment - 06/22/19 0922    Subjective  Dooing better than last session Pain less. Back to  about normal    Pain Score  3     Pain Location  Shoulder    Pain Orientation  Left    Pain Descriptors / Indicators  Aching;Sore    Pain Type  Surgical pain    Pain Frequency  Constant    Aggravating Factors   moving Lt arm overhead and behind back                       [facility] Adult PT Treatment/Exercise - 06/22/19 0001      Shoulder Exercises: Seated   Flexion  Both;12 reps    Flexion Weight (lbs)  2    Flexion Limitations  weight on dowel  flexion into scaption, also overhead press x 10      Shoulder Exercises: Standing   Horizontal ABduction  Left;10 reps    Theraband Level (Shoulder Horizontal ABduction)  Level 4 (Blue)    Internal Rotation  Left;15 reps    Theraband Level (Shoulder Internal Rotation)  Level 4 (Blue)    Extension  Left;15 reps    Theraband Level (Shoulder Extension)  Level 4 (Blue)    Row  Left;15 reps    Theraband Level (Shoulder Row)  Level 4 (Blue)      Shoulder  Exercises: Pulleys   Flexion  5 minutes      Shoulder Exercises: ROM/Strengthening   UBE (Upper Arm Bike)  L1 3 min forward 3 min back    [place]  15 reps CW/CCW shoulder height    Other ROM/Strengthening Exercises  seated bench press 10 pounds x 15. , biceps curls  x 15 7#         Manual Therapy   Passive ROM  scapula retraction  and depression  stretch 20 sec x 4 and  reaching behind back able to get thumb to sacrum and back of hand over LT buttock.               PT Short Term Goals - 05/04/19 0848      PT SHORT TERM GOAL #1   Title  Pt will be I with initial HEP    Status  Achieved      PT SHORT TERM GOAL #2   Title  Pt will be able utilize RICE for home, self care and pain mgmt.    Status  Achieved      PT SHORT TERM GOAL #3   Title  Pt will be able to perform AAROM flexion and abduction to 110 deg in supine to work towards full function.    Status  Achieved      PT SHORT TERM GOAL #4   Title  Pt will wean out of sling as able per MD for >75% of the time     Baseline  out for 2 weeks    Status  Achieved        PT Long Term Goals - 06/11/19 1128      PT LONG TERM GOAL #1   Title  Pt will score < 40% impaired on FOTO to demonstrate functional mobility and use of dominant arm.    Status  Unable to assess      PT LONG TERM GOAL #2   Title  Pt will be able to show 4+/5 strength in L UE or more for maximal function.    Baseline  3/5 abduction    Status  On-going      PT LONG TERM GOAL #3   Title  Pt will complete ADLs without lasting increased pain in L UE, dressing and overhead    Baseline  limited over shoulder height with weight    Status  On-going      PT LONG TERM GOAL #4   Title  Pt will be able to safely lift bowl, pot/pan with LUE for cooking, baking and use L UE to stir.    Status  On-going      PT LONG TERM GOAL #5   Title  Pt will be I with final HEP for L UE ROM and strengthening    Status  On-going            Plan - 06/22/19 0911    Clinical Impression Statement  Decrease dpain today alloweing full session and he pushed himself  with all activity.   improved reach behind back stretch.     overhead with weight oday    PT Treatment/Interventions  ADLs/Self Care Home Management;Cryotherapy;Electrical Stimulation;Moist Heat;Ultrasound;Functional mobility training;Manual techniques;Passive range of motion;Vasopneumatic Device;Taping;Therapeutic exercise;Iontophoresis 4mg /ml Dexamethasone;Therapeutic activities;Neuromuscular re-education;Patient/family education    PT Next Visit Plan  , cont ROM, strength , manual as needed , scap Rhythmic stab, weights  as tolerated       ROM for behind the back reaching  PT Home Exercise Plan  table slides, pendulums, isometrics, cane flexion OK.  red band for ER,      behind back  stretch    Consulted and Agree with Plan of Care  Patient       Patient will benefit from skilled therapeutic intervention in order to improve the following deficits and impairments:  Decreased range of  motion, Decreased strength, Hypomobility, Decreased mobility, Decreased balance, Obesity, Postural dysfunction, Impaired flexibility, Impaired UE functional use, Pain  Visit Diagnosis: Muscle weakness (generalized)  S/P reverse total shoulder arthroplasty, left  Acute pain of left shoulder  Other symptoms and signs involving the musculoskeletal system     Problem List Patient Active Problem List   Diagnosis Date Noted  . 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 Lumbar stenosis with neurogenic claudication 10/28/2018  . DM type 2 (diabetes mellitus, type 2) ([place]) 01/15/2012  . CAD (coronary artery disease) 01/15/2012  . HTN (hypertension) 01/15/2012  . Dyslipidemia 01/15/2012  . COPD (chronic obstructive pulmonary disease); chronic bronchitis 01/15/2012  . Tobacco abuse 01/15/2012  . BMI 40.0-44.9, adult ([place]) 01/15/2012  . OSA (obstructive sleep apnea) 01/15/2012  . Insomnia 01/15/2012  . IBS (irritable bowel syndrome) 01/15/2012  . Hypogonadism male 01/15/2012  . DJD (degenerative joint disease) 01/15/2012  . Rotator cuff arthropathy, left 01/15/2012  . GERD (gastroesophageal reflux disease) 01/15/2012  . CHEST PAIN, ATYPICAL 07/12/2010  . TOBACCO USER 10/20/2009  . CAD, NATIVE VESSEL 09/29/2009  . EMPHYSEMA 09/21/2009  . DM 08/31/2009  . HYPERLIPIDEMIA 08/31/2009  . OBSTRUCTIVE SLEEP APNEA 08/31/2009  . Essential hypertension 08/31/2009  . CHRONIC OBSTRUCTIVE PULMONARY DISEASE, ACUTE EXACERBATION 08/31/2009  . DYSPNEA 08/31/2009    Darrel Hoover  PT 06/22/2019, 10:48 AM  [facility] [address] [place], [name], [address] Phone: [phone]   Fax:  [phone]  Name: BURNIE POULTER MRN: [id number] Date of Birth: 19-Aug-1946

## 2019-06-25 ENCOUNTER — Other Ambulatory Visit: Payer: Self-pay

## 2019-06-25 ENCOUNTER — Ambulatory Visit: Payer: PPO

## 2019-06-25 DIAGNOSIS — Z96612 Presence of left artificial shoulder joint: Secondary | ICD-10-CM

## 2019-06-25 DIAGNOSIS — M6281 Muscle weakness (generalized): Secondary | ICD-10-CM

## 2019-06-25 NOTE — Therapy (Signed)
[place], [name], [address] Phone: [phone]   Fax:  [phone]  Physical Therapy Treatment  Patient Details  Name: Justin Trujillo MRN: [id number] Date of Birth: 1946-02-12 Referring Provider (PT): Dr. Justice Britain    Encounter Date: 06/25/2019  PT End of Session - 06/25/19 0907    Visit Number  16    Number of Visits  22    Date for PT Re-Evaluation  08/07/19    Authorization Type  [employer]alt[employer]    PT Start Time  0910    PT Stop Time  1000    PT Time Calculation (min)  50 min    Activity Tolerance  Patient tolerated treatment well    Behavior During Therapy  [facility] for tasks assessed/performed       Past Medical History:  Diagnosis Date  . Arthritis   . Coronary atherosclerosis of native coronary artery   . Hypogonadism male   . Obstructive chronic bronchitis with exacerbation ([place])   . Obstructive sleep apnea (adult) (pediatric)    no cpap  . Other and unspecified hyperlipidemia   . Other emphysema ([place])   . Proteinuria 2019   has seen a nephrologist  . Shortness of breath    with excertion  . Type II or unspecified type diabetes mellitus without mention of complication, not stated as uncontrolled    type 2  . Unspecified essential hypertension     Past Surgical History:  Procedure Laterality Date  . CARDIAC CATHETERIZATION    . IR KYPHO LUMBAR INC FX REDUCE BONE BX UNI/BIL CANNULATION INC/IMAGING  06/13/2018  . L lens implant    . R knee replacement x2    . REVERSE SHOULDER ARTHROPLASTY Left 03/26/2019   Procedure: REVERSE SHOULDER ARTHROPLASTY;  Surgeon: Justice Britain, MD;  Location: [facility];  Service: Orthopedics;  Laterality: Left;  174min  . TONSILLECTOMY    . TONSILLECTOMY AND ADENOIDECTOMY    . vascectomy      There were no vitals filed for this visit.  Subjective Assessment - 06/25/19 0913    Subjective  Busy wek and did not do as mucxh exercise as normal.  Today  noticed  I was able to reach coffee reaching to Lt past a point it is better.  Can reach RT arm pit now. Can reach better and lift 40 # bag of food  easier.    Pain Score  2     Pain Location  Shoulder    Pain Orientation  Left    Pain Descriptors / Indicators  Aching;Sore    Pain Type  Surgical pain    Pain Onset  More than a month ago    Pain Frequency  Constant    Aggravating Factors   Reaching, stretch , lifting    Pain Relieving Factors  cold , rest                       [facility] Adult PT Treatment/Exercise - 06/25/19 0001      Shoulder Exercises: Seated   Flexion  Both;20 reps    Flexion Weight (lbs)  3    Flexion Limitations  weight on dowel  flexion into scaption, also overhead press x 10      Shoulder Exercises: Standing   Horizontal ABduction  Left;20 reps    Theraband Level (Shoulder Horizontal ABduction)  Level 4 (Blue)    External Rotation  Left;15 reps    Internal  Rotation  Left;20 reps    Theraband Level (Shoulder Internal Rotation)  Level 4 (Blue)    Extension  Left;20 reps    Theraband Level (Shoulder Extension)  Level 4 (Blue)    Row  Left;20 reps    Theraband Level (Shoulder Row)  Level 4 (Blue)      Shoulder Exercises: Pulleys   Flexion  3 minutes      Shoulder Exercises: ROM/Strengthening   UBE (Upper Arm Bike)  L1.5   3 min forward 3 min back    Other ROM/Strengthening Exercises  seated bench press 20 pounds x 20. ,   then 20 # pull downs x 20      Manual Therapy   Passive ROM  scapula retraction  and depression  stretch 20 sec x 4 and  reaching behind back able to get thumb to sacrum and back of hand over LT buttock.               PT Short Term Goals - 05/04/19 0848      PT SHORT TERM GOAL #1   Title  Pt will be I with initial HEP    Status  Achieved      PT SHORT TERM GOAL #2   Title  Pt will be able utilize RICE for home, self care and pain mgmt.    Status  Achieved      PT SHORT TERM GOAL #3   Title  Pt will be able  to perform AAROM flexion and abduction to 110 deg in supine to work towards full function.    Status  Achieved      PT SHORT TERM GOAL #4   Title  Pt will wean out of sling as able per MD for >75% of the time    Baseline  out for 2 weeks    Status  Achieved        PT Long Term Goals - 06/25/19 0956      PT LONG TERM GOAL #1   Title  Pt will score < 40% impaired on FOTO to demonstrate functional mobility and use of dominant arm.    Status  On-going      PT LONG TERM GOAL #2   Title  Pt will be able to show 4+/5 strength in L UE or more for maximal function.    Baseline  Still weak overhead nad with ER.    Status  On-going      PT LONG TERM GOAL #3   Title  Pt will complete ADLs without lasting increased pain in L UE, dressing and overhead    Baseline  limited over shoulder height with weight, pain varies but generally never severe      PT LONG TERM GOAL #4   Title  Pt will be able to safely lift bowl, pot/pan with LUE for cooking, baking and use L UE to stir.    Baseline  needs some asssit with rT hand for safety    Status  On-going      PT LONG TERM GOAL #5   Title  Pt will be I with final HEP for L UE ROM and strengthening    Status  On-going      Additional Long Term Goals   Additional Long Term Goals  Yes      PT LONG TERM GOAL #6   Title  He will be able to reach behind duttok and hole LT hand with RT.    Time  3    Period  Weeks    Status  New            Plan - 06/25/19 0908    Clinical Impression Statement  I was able to stretch Lt hand to RT buttock so incr ROM.   He is working hard on strength.   Will continue to push strength and keep behnind back loose so maybe he can reach this by himself    PT Treatment/Interventions  ADLs/Self Care Home Management;Cryotherapy;Electrical Stimulation;Moist Heat;Ultrasound;Functional mobility training;Manual techniques;Passive range of motion;Vasopneumatic Device;Taping;Therapeutic exercise;Iontophoresis 4mg /ml  Dexamethasone;Therapeutic activities;Neuromuscular re-education;Patient/family education    PT Next Visit Plan  , cont ROM, strength , manual as needed , scap Rhythmic stab, weights  as tolerated       ROM for behind the back reaching    PT Home Exercise Plan  table slides, pendulums, isometrics, cane flexion OK.  red band for ER,      behind back  stretch    Consulted and Agree with Plan of Care  Patient       Patient will benefit from skilled therapeutic intervention in order to improve the following deficits and impairments:  Decreased range of motion, Decreased strength, Hypomobility, Decreased mobility, Decreased balance, Obesity, Postural dysfunction, Impaired flexibility, Impaired UE functional use, Pain  Visit Diagnosis: Muscle weakness (generalized)  S/P reverse total shoulder arthroplasty, left     Problem List Patient Active Problem List   Diagnosis Date Noted  . 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 Lumbar stenosis with neurogenic claudication 10/28/2018  . DM type 2 (diabetes mellitus, type 2) ([place]) 01/15/2012  . CAD (coronary artery disease) 01/15/2012  . HTN (hypertension) 01/15/2012  . Dyslipidemia 01/15/2012  . COPD (chronic obstructive pulmonary disease); chronic bronchitis 01/15/2012  . Tobacco abuse 01/15/2012  . BMI 40.0-44.9, adult ([place]) 01/15/2012  . OSA (obstructive sleep apnea) 01/15/2012  . Insomnia 01/15/2012  . IBS (irritable bowel syndrome) 01/15/2012  . Hypogonadism male 01/15/2012  . DJD (degenerative joint disease) 01/15/2012  . Rotator cuff arthropathy, left 01/15/2012  . GERD (gastroesophageal reflux disease) 01/15/2012  . CHEST PAIN, ATYPICAL 07/12/2010  . TOBACCO USER 10/20/2009  . CAD, NATIVE VESSEL 09/29/2009  . EMPHYSEMA 09/21/2009  . DM 08/31/2009  . HYPERLIPIDEMIA 08/31/2009  . OBSTRUCTIVE SLEEP APNEA 08/31/2009  . Essential hypertension 08/31/2009  . CHRONIC OBSTRUCTIVE PULMONARY DISEASE, ACUTE EXACERBATION  08/31/2009  . DYSPNEA 08/31/2009    Darrel Hoover  PT 06/25/2019, 9:58 AM  [facility] [address] [place], [name], [address] Phone: [phone]   Fax:  [phone]  Name: Justin Trujillo MRN: [id number] Date of Birth: May 12, 1946

## 2019-06-26 ENCOUNTER — Ambulatory Visit
Admission: RE | Admit: 2019-06-26 | Discharge: 2019-06-26 | Disposition: A | Discharge status: Home / Self Care | Payer: PPO | Source: Ambulatory Visit | Attending: Neurological Surgery | Admitting: Neurological Surgery

## 2019-06-26 DIAGNOSIS — M48061 Spinal stenosis, lumbar region without neurogenic claudication: Secondary | ICD-10-CM | POA: Diagnosis not present

## 2019-06-26 DIAGNOSIS — M4150 Other secondary scoliosis, site unspecified: Secondary | ICD-10-CM | POA: Diagnosis not present

## 2019-06-26 DIAGNOSIS — M549 Dorsalgia, unspecified: Secondary | ICD-10-CM

## 2019-06-26 DIAGNOSIS — Z9889 Other specified postprocedural states: Secondary | ICD-10-CM

## 2019-06-30 ENCOUNTER — Ambulatory Visit: Payer: PPO

## 2019-06-30 ENCOUNTER — Other Ambulatory Visit: Payer: Self-pay

## 2019-06-30 DIAGNOSIS — Z96612 Presence of left artificial shoulder joint: Secondary | ICD-10-CM

## 2019-06-30 DIAGNOSIS — M6281 Muscle weakness (generalized): Secondary | ICD-10-CM

## 2019-06-30 DIAGNOSIS — M25512 Pain in left shoulder: Secondary | ICD-10-CM

## 2019-06-30 DIAGNOSIS — R29898 Other symptoms and signs involving the musculoskeletal system: Secondary | ICD-10-CM

## 2019-06-30 NOTE — Therapy (Signed)
[place], [name], [address] Phone: [phone]   Fax:  [phone]  Physical Therapy Treatment  Patient Details  Name: Justin Trujillo MRN: [id number] Date of Birth: [birth year] Referring Provider (PT): Dr. Justice Britain    Encounter Date: 06/30/2019  PT End of Session - 06/30/19 0924    Visit Number  17    Number of Visits  22    Date for PT Re-Evaluation  08/07/19    Authorization Type  [employer]alt[employer]    PT Start Time  0915    PT Stop Time  1000    PT Time Calculation (min)  45 min    Activity Tolerance  Patient tolerated treatment well    Behavior During Therapy  [facility] for tasks assessed/performed       Past Medical History:  Diagnosis Date  . Arthritis   . Coronary atherosclerosis of native coronary artery   . Hypogonadism male   . Obstructive chronic bronchitis with exacerbation ([place])   . Obstructive sleep apnea (adult) (pediatric)    no cpap  . Other and unspecified hyperlipidemia   . Other emphysema ([place])   . Proteinuria 2019   has seen a nephrologist  . Shortness of breath    with excertion  . Type II or unspecified type diabetes mellitus without mention of complication, not stated as uncontrolled    type 2  . Unspecified essential hypertension     Past Surgical History:  Procedure Laterality Date  . CARDIAC CATHETERIZATION    . IR KYPHO LUMBAR INC FX REDUCE BONE BX UNI/BIL CANNULATION INC/IMAGING  06/13/2018  . L lens implant    . R knee replacement x2    . REVERSE SHOULDER ARTHROPLASTY Left 03/26/2019   Procedure: REVERSE SHOULDER ARTHROPLASTY;  Surgeon: Justice Britain, MD;  Location: [facility];  Service: Orthopedics;  Laterality: Left;  156min  . TONSILLECTOMY    . TONSILLECTOMY AND ADENOIDECTOMY    . vascectomy      There were no vitals filed for this visit.  Subjective Assessment - 06/30/19 0922    Subjective  Hectic morning. Wife had worse depression lately. Shoulder  doing [place] . Moved it wrong at times with pain. Overall shoulder Ok.    Pain Score  2     Pain Location  Shoulder    Pain Orientation  Left    Pain Descriptors / Indicators  Aching;Sore    Pain Type  Surgical pain    Pain Onset  More than a month ago    Pain Frequency  Constant    Aggravating Factors   using Lt arm    Pain Relieving Factors  cold , rest                       [facility] Adult PT Treatment/Exercise - 06/30/19 0001      Shoulder Exercises: Seated   Flexion  Both;20 reps    Flexion Weight (lbs)  4    Flexion Limitations  weight on dowel  flexion into scaption, also overhead press x 10      Shoulder Exercises: Standing   Theraband Level (Shoulder Horizontal ABduction)  Level 4 (Blue)    Horizontal ABduction Limitations  LT x 25 and adduction    External Rotation  Left;20 reps    External Rotation Limitations  isometrics  with blue band    Theraband Level (Shoulder Internal Rotation)  Level 4 (Blue)    Internal Rotation  Limitations  LT x 25    Theraband Level (Shoulder Extension)  Level 4 (Blue)    Extension Limitations  LT x 25    Theraband Level (Shoulder Row)  Level 4 (Blue)    Row Limitations  Lt x 25      Shoulder Exercises: Pulleys   Flexion  3 minutes      Shoulder Exercises: Body Blade   Other Body Blade Exercises  ER/IR /abduction / press down and up x 30 sec       Manual Therapy   Soft tissue mobilization  pectoral LT  and upper 1/4 LT      Arm bike 3 min for 3 back L2         PT Short Term Goals - 05/04/19 0848      PT SHORT TERM GOAL #1   Title  Pt will be I with initial HEP    Status  Achieved      PT SHORT TERM GOAL #2   Title  Pt will be able utilize RICE for home, self care and pain mgmt.    Status  Achieved      PT SHORT TERM GOAL #3   Title  Pt will be able to perform AAROM flexion and abduction to 110 deg in supine to work towards full function.    Status  Achieved      PT SHORT TERM GOAL #4   Title  Pt will wean  out of sling as able per MD for >75% of the time    Baseline  out for 2 weeks    Status  Achieved        PT Long Term Goals - 06/25/19 0956      PT LONG TERM GOAL #1   Title  Pt will score < 40% impaired on FOTO to demonstrate functional mobility and use of dominant arm.    Status  On-going      PT LONG TERM GOAL #2   Title  Pt will be able to show 4+/5 strength in L UE or more for maximal function.    Baseline  Still weak overhead nad with ER.    Status  On-going      PT LONG TERM GOAL #3   Title  Pt will complete ADLs without lasting increased pain in L UE, dressing and overhead    Baseline  limited over shoulder height with weight, pain varies but generally never severe      PT LONG TERM GOAL #4   Title  Pt will be able to safely lift bowl, pot/pan with LUE for cooking, baking and use L UE to stir.    Baseline  needs some asssit with rT hand for safety    Status  On-going      PT LONG TERM GOAL #5   Title  Pt will be I with final HEP for L UE ROM and strengthening    Status  On-going      Additional Long Term Goals   Additional Long Term Goals  Yes      PT LONG TERM GOAL #6   Title  He will be able to reach behind duttok and hole LT hand with RT.    Time  3    Period  Weeks    Status  New            Plan - 06/30/19 0930    Clinical Impression Statement  Tolerated increased reps and weight overhead. STW  decreased soreness for band work.  Will work with weight to Lt arm without dowel. His back became sore withUBE so he stopped with 1 min lef. He reported he was fine on leaving    PT Treatment/Interventions  ADLs/Self Care Home Management;Cryotherapy;Electrical Stimulation;Moist Heat;Ultrasound;Functional mobility training;Manual techniques;Passive range of motion;Vasopneumatic Device;Taping;Therapeutic exercise;Iontophoresis 4mg /ml Dexamethasone;Therapeutic activities;Neuromuscular re-education;Patient/family education    PT Next Visit Plan  , cont ROM, strength ,  manual as needed , scap Rhythmic stab, weights  as tolerated       ROM for behind the back reaching    PT Home Exercise Plan  table slides, pendulums, isometrics, cane flexion OK.  red band for ER,      behind back  stretch    Consulted and Agree with Plan of Care  Patient       Patient will benefit from skilled therapeutic intervention in order to improve the following deficits and impairments:  Decreased range of motion, Decreased strength, Hypomobility, Decreased mobility, Decreased balance, Obesity, Postural dysfunction, Impaired flexibility, Impaired UE functional use, Pain  Visit Diagnosis: Muscle weakness (generalized)  S/P reverse total shoulder arthroplasty, left  Acute pain of left shoulder  Other symptoms and signs involving the musculoskeletal system     Problem List Patient Active Problem List   Diagnosis Date Noted  . 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 Lumbar stenosis with neurogenic claudication 10/28/2018  . DM type 2 (diabetes mellitus, type 2) ([place]) 01/15/2012  . CAD (coronary artery disease) 01/15/2012  . HTN (hypertension) 01/15/2012  . Dyslipidemia 01/15/2012  . COPD (chronic obstructive pulmonary disease); chronic bronchitis 01/15/2012  . Tobacco abuse 01/15/2012  . BMI 40.0-44.9, adult ([place]) 01/15/2012  . OSA (obstructive sleep apnea) 01/15/2012  . Insomnia 01/15/2012  . IBS (irritable bowel syndrome) 01/15/2012  . Hypogonadism male 01/15/2012  . DJD (degenerative joint disease) 01/15/2012  . Rotator cuff arthropathy, left 01/15/2012  . GERD (gastroesophageal reflux disease) 01/15/2012  . CHEST PAIN, ATYPICAL 07/12/2010  . TOBACCO USER 10/20/2009  . CAD, NATIVE VESSEL 09/29/2009  . EMPHYSEMA 09/21/2009  . DM 08/31/2009  . HYPERLIPIDEMIA 08/31/2009  . OBSTRUCTIVE SLEEP APNEA 08/31/2009  . Essential hypertension 08/31/2009  . CHRONIC OBSTRUCTIVE PULMONARY DISEASE, ACUTE EXACERBATION 08/31/2009  . DYSPNEA 08/31/2009     Darrel Hoover  PT 06/30/2019, 10:05 AM  [facility] [address] [place], [name], [address] Phone: [phone]   Fax:  [phone]  Name: ANTWUAN RUEFF MRN: [id number] Date of Birth: [birth year]

## 2019-07-02 ENCOUNTER — Other Ambulatory Visit: Payer: Self-pay

## 2019-07-02 ENCOUNTER — Ambulatory Visit: Payer: PPO

## 2019-07-02 DIAGNOSIS — M6281 Muscle weakness (generalized): Secondary | ICD-10-CM

## 2019-07-02 DIAGNOSIS — Z96612 Presence of left artificial shoulder joint: Secondary | ICD-10-CM

## 2019-07-02 NOTE — Therapy (Signed)
[place], [name], [address] Phone: [phone]   Fax:  [phone]  Physical Therapy Treatment  Patient Details  Name: Justin Trujillo MRN: [id number] Date of Birth: December 25, 1945 Referring Provider (PT): Dr. Justice Britain    Encounter Date: 07/02/2019  PT End of Session - 07/02/19 1057    Visit Number  18    Number of Visits  22    Date for PT Re-Evaluation  08/07/19    Authorization Type  [employer]    PT Start Time  1100    PT Stop Time  1144    PT Time Calculation (min)  44 min    Activity Tolerance  Patient tolerated treatment well    Behavior During Therapy  [facility] for tasks assessed/performed       Past Medical History:  Diagnosis Date  . Arthritis   . Coronary atherosclerosis of native coronary artery   . Hypogonadism male   . Obstructive chronic bronchitis with exacerbation ([place])   . Obstructive sleep apnea (adult) (pediatric)    no cpap  . Other and unspecified hyperlipidemia   . Other emphysema ([place])   . Proteinuria 2019   has seen a nephrologist  . Shortness of breath    with excertion  . Type II or unspecified type diabetes mellitus without mention of complication, not stated as uncontrolled    type 2  . Unspecified essential hypertension     Past Surgical History:  Procedure Laterality Date  . CARDIAC CATHETERIZATION    . IR KYPHO LUMBAR INC FX REDUCE BONE BX UNI/BIL CANNULATION INC/IMAGING  06/13/2018  . L lens implant    . R knee replacement x2    . REVERSE SHOULDER ARTHROPLASTY Left 03/26/2019   Procedure: REVERSE SHOULDER ARTHROPLASTY;  Surgeon: Justice Britain, MD;  Location: [facility];  Service: Orthopedics;  Laterality: Left;  121min  . TONSILLECTOMY    . TONSILLECTOMY AND ADENOIDECTOMY    . vascectomy      There were no vitals filed for this visit.  Subjective Assessment - 07/02/19 1103    Subjective  Shoulder felt fine.  Still can't get hand behind back.    Pain  Score  2     Pain Location  Shoulder    Pain Orientation  Left    Pain Descriptors / Indicators  Aching    Pain Type  Surgical pain    Pain Onset  More than a month ago    Pain Frequency  Constant    Aggravating Factors   use of LT arm    Pain Relieving Factors  rest , cold         [facility] PT Assessment - 07/02/19 0001      AROM   Left Shoulder Flexion  125 Degrees    Left Shoulder ABduction  135 Degrees    Left Shoulder External Rotation  75 Degrees   arm supported at 90 degrees sitting                  [facility] Adult PT Treatment/Exercise - 07/02/19 0001      Shoulder Exercises: Supine   Horizontal ABduction  Left;20 reps    Horizontal ABduction Weight (lbs)  2    Internal Rotation  Left;20 reps    Internal Rotation Weight (lbs)  2    Diagonals  Left;20 reps      Shoulder Exercises: Seated   Elevation  Left;10 reps    Elevation Limitations  overhead 1# x 10      Shoulder Exercises: Sidelying   External Rotation  Left;20 reps    External Rotation Weight (lbs)  2    ABduction  Left;20 reps    ABduction Weight (lbs)  3      Shoulder Exercises: Pulleys   Flexion  3 minutes      Shoulder Exercises: ROM/Strengthening   UBE (Upper Arm Bike)  L1 2 minforw and 2 back                PT Short Term Goals - 05/04/19 0848      PT SHORT TERM GOAL #1   Title  Pt will be I with initial HEP    Status  Achieved      PT SHORT TERM GOAL #2   Title  Pt will be able utilize RICE for home, self care and pain mgmt.    Status  Achieved      PT SHORT TERM GOAL #3   Title  Pt will be able to perform AAROM flexion and abduction to 110 deg in supine to work towards full function.    Status  Achieved      PT SHORT TERM GOAL #4   Title  Pt will wean out of sling as able per MD for >75% of the time    Baseline  out for 2 weeks    Status  Achieved        PT Long Term Goals - 07/02/19 1135      PT LONG TERM GOAL #1   Title  Pt will score < 40% impaired on FOTO  to demonstrate functional mobility and use of dominant arm.    Baseline  45% limited  significant improvement    Status  On-going      PT LONG TERM GOAL #2   Title  Pt will be able to show 4+/5 strength in L UE or more for maximal function.    Baseline  does not appear he will reach 4+/5  for any groups    Status  On-going      PT LONG TERM GOAL #3   Title  Pt will complete ADLs without lasting increased pain in L UE, dressing and overhead    Baseline  never severe    Status  On-going      PT LONG TERM GOAL #4   Title  Pt will be able to safely lift bowl, pot/pan with LUE for cooking, baking and use L UE to stir.    Baseline  needs some asssit with rT hand for safety    Status  On-going      PT LONG TERM GOAL #5   Title  Pt will be I with final HEP for L UE ROM and strengthening    Status  On-going      PT LONG TERM GOAL #6   Title  He will be able to reach behind buttok and hold LT hand with RT.    Status  On-going            Plan - 07/02/19 1057    Clinical Impression Statement  ROM improved with abduct and ER.   Continues weak  but generally improving . Cont to work Lt arm independnet of RT.    PT Treatment/Interventions  ADLs/Self Care Home Management;Cryotherapy;Electrical Stimulation;Moist Heat;Ultrasound;Functional mobility training;Manual techniques;Passive range of motion;Vasopneumatic Device;Taping;Therapeutic exercise;Iontophoresis 4mg /ml Dexamethasone;Therapeutic activities;Neuromuscular re-education;Patient/family education    PT Next Visit Plan  , cont ROM,  strength , manual as needed ,  weights  as tolerated, try pots and pans in kitchen    PT Home Exercise Plan  table slides, pendulums, isometrics, cane flexion OK.  red band for ER,      behind back  stretch    Consulted and Agree with Plan of Care  Patient       Patient will benefit from skilled therapeutic intervention in order to improve the following deficits and impairments:  Decreased range of motion,  Decreased strength, Hypomobility, Decreased mobility, Decreased balance, Obesity, Postural dysfunction, Impaired flexibility, Impaired UE functional use, Pain  Visit Diagnosis: Muscle weakness (generalized)  S/P reverse total shoulder arthroplasty, left     Problem List Patient Active Problem List   Diagnosis Date Noted  . 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 Lumbar stenosis with neurogenic claudication 10/28/2018  . DM type 2 (diabetes mellitus, type 2) ([place]) 01/15/2012  . CAD (coronary artery disease) 01/15/2012  . HTN (hypertension) 01/15/2012  . Dyslipidemia 01/15/2012  . COPD (chronic obstructive pulmonary disease); chronic bronchitis 01/15/2012  . Tobacco abuse 01/15/2012  . BMI 40.0-44.9, adult ([place]) 01/15/2012  . OSA (obstructive sleep apnea) 01/15/2012  . Insomnia 01/15/2012  . IBS (irritable bowel syndrome) 01/15/2012  . Hypogonadism male 01/15/2012  . DJD (degenerative joint disease) 01/15/2012  . Rotator cuff arthropathy, left 01/15/2012  . GERD (gastroesophageal reflux disease) 01/15/2012  . CHEST PAIN, ATYPICAL 07/12/2010  . TOBACCO USER 10/20/2009  . CAD, NATIVE VESSEL 09/29/2009  . EMPHYSEMA 09/21/2009  . DM 08/31/2009  . HYPERLIPIDEMIA 08/31/2009  . OBSTRUCTIVE SLEEP APNEA 08/31/2009  . Essential hypertension 08/31/2009  . CHRONIC OBSTRUCTIVE PULMONARY DISEASE, ACUTE EXACERBATION 08/31/2009  . DYSPNEA 08/31/2009    Darrel Hoover  PT 07/02/2019, 11:38 AM  [facility] [address] [place], [name], [address] Phone: [phone]   Fax:  [phone]  Name: Justin Trujillo MRN: [id number] Date of Birth: 1946-04-21

## 2019-07-07 ENCOUNTER — Ambulatory Visit: Payer: PPO

## 2019-07-07 ENCOUNTER — Other Ambulatory Visit: Payer: Self-pay

## 2019-07-07 DIAGNOSIS — M6281 Muscle weakness (generalized): Secondary | ICD-10-CM | POA: Diagnosis not present

## 2019-07-07 DIAGNOSIS — Z96612 Presence of left artificial shoulder joint: Secondary | ICD-10-CM

## 2019-07-07 DIAGNOSIS — R29898 Other symptoms and signs involving the musculoskeletal system: Secondary | ICD-10-CM

## 2019-07-07 NOTE — Therapy (Signed)
[place], [name], [address] Phone: [phone]   Fax:  [phone]  Physical Therapy Treatment  Patient Details  Name: Justin Trujillo MRN: [id number] Date of Birth: 08/13/1946 Referring Provider (PT): Dr. Justice Britain    Encounter Date: 07/07/2019  PT End of Session - 07/07/19 1005    Visit Number  19    Number of Visits  22    Date for PT Re-Evaluation  08/07/19    Authorization Type  [employer]alt[employer]    PT Start Time  1000    PT Stop Time  1045    PT Time Calculation (min)  45 min    Activity Tolerance  Patient tolerated treatment well    Behavior During Therapy  [facility] for tasks assessed/performed       Past Medical History:  Diagnosis Date  . Arthritis   . Coronary atherosclerosis of native coronary artery   . Hypogonadism male   . Obstructive chronic bronchitis with exacerbation ([place])   . Obstructive sleep apnea (adult) (pediatric)    no cpap  . Other and unspecified hyperlipidemia   . Other emphysema ([place])   . Proteinuria 2019   has seen a nephrologist  . Shortness of breath    with excertion  . Type II or unspecified type diabetes mellitus without mention of complication, not stated as uncontrolled    type 2  . Unspecified essential hypertension     Past Surgical History:  Procedure Laterality Date  . CARDIAC CATHETERIZATION    . IR KYPHO LUMBAR INC FX REDUCE BONE BX UNI/BIL CANNULATION INC/IMAGING  06/13/2018  . L lens implant    . R knee replacement x2    . REVERSE SHOULDER ARTHROPLASTY Left 03/26/2019   Procedure: REVERSE SHOULDER ARTHROPLASTY;  Surgeon: Justice Britain, MD;  Location: [facility];  Service: Orthopedics;  Laterality: Left;  141min  . TONSILLECTOMY    . TONSILLECTOMY AND ADENOIDECTOMY    . vascectomy      There were no vitals filed for this visit.  Subjective Assessment - 07/07/19 1010    Subjective  Back has been giving me a fit.   Shoulder sore form using it     Pain Score  3     Pain Location  Shoulder    Pain Orientation  Left    Pain Descriptors / Indicators  Aching    Pain Type  Surgical pain    Pain Onset  More than a month ago    Pain Frequency  Constant                       [facility] Adult PT Treatment/Exercise - 07/07/19 0001      Therapeutic Activites    Therapeutic Activities  Lifting    Lifting  plates and dishes ino upper cabinet to head level  x 20       Shoulder Exercises: Seated   Elevation  Left;15 reps    Elevation Limitations  2# and 1#      Shoulder Exercises: Standing   Other Standing Exercises  assisted reaching to back and when able he grasped LT with rT hand and gently pulled , also did scapula retraction x 5-7 reps 3 sets       Shoulder Exercises: Pulleys   Flexion  3 minutes      Shoulder Exercises: ROM/Strengthening   UBE (Upper Arm Bike)  l2 3 min for 3 min back  PT Short Term Goals - 05/04/19 0848      PT SHORT TERM GOAL #1   Title  Pt will be I with initial HEP    Status  Achieved      PT SHORT TERM GOAL #2   Title  Pt will be able utilize RICE for home, self care and pain mgmt.    Status  Achieved      PT SHORT TERM GOAL #3   Title  Pt will be able to perform AAROM flexion and abduction to 110 deg in supine to work towards full function.    Status  Achieved      PT SHORT TERM GOAL #4   Title  Pt will wean out of sling as able per MD for >75% of the time    Baseline  out for 2 weeks    Status  Achieved        PT Long Term Goals - 07/02/19 1135      PT LONG TERM GOAL #1   Title  Pt will score < 40% impaired on FOTO to demonstrate functional mobility and use of dominant arm.    Baseline  45% limited  significant improvement    Status  On-going      PT LONG TERM GOAL #2   Title  Pt will be able to show 4+/5 strength in L UE or more for maximal function.    Baseline  does not appear he will reach 4+/5  for any groups    Status  On-going      PT LONG  TERM GOAL #3   Title  Pt will complete ADLs without lasting increased pain in L UE, dressing and overhead    Baseline  never severe    Status  On-going      PT LONG TERM GOAL #4   Title  Pt will be able to safely lift bowl, pot/pan with LUE for cooking, baking and use L UE to stir.    Baseline  needs some asssit with rT hand for safety    Status  On-going      PT LONG TERM GOAL #5   Title  Pt will be I with final HEP for L UE ROM and strengthening    Status  On-going      PT LONG TERM GOAL #6   Title  He will be able to reach behind buttok and hold LT hand with RT.    Status  On-going            Plan - 07/07/19 1005    Clinical Impression Statement  Weakness limitis weight lifted voerhead and to head height . 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   We did not have pots and pans so was not able to assess ability for this  Will use a dowel and weight next time    PT Treatment/Interventions  ADLs/Self Care Home Management;Cryotherapy;Electrical Stimulation;Moist Heat;Ultrasound;Functional mobility training;Manual techniques;Passive range of motion;Vasopneumatic Device;Taping;Therapeutic exercise;Iontophoresis 4mg /ml Dexamethasone;Therapeutic activities;Neuromuscular re-education;Patient/family education    PT Next Visit Plan  , cont ROM, strength , manual as needed ,  weights  as tolerated,    PT Home Exercise Plan  table slides, pendulums, isometrics, cane flexion OK.  red band for ER,      behind back  stretch    Consulted and Agree with Plan of Care  Patient  Patient will benefit from skilled therapeutic intervention in order to improve the following deficits and impairments:  Decreased range of motion, Decreased strength, Hypomobility, Decreased mobility, Decreased balance, Obesity, Postural dysfunction, Impaired flexibility, Impaired UE functional use, Pain  Visit  Diagnosis: Muscle weakness (generalized)  S/P reverse total shoulder arthroplasty, left  Other symptoms and signs involving the musculoskeletal system     Problem List Patient Active Problem List   Diagnosis Date Noted  . 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 Lumbar stenosis with neurogenic claudication 10/28/2018  . DM type 2 (diabetes mellitus, type 2) ([place]) 01/15/2012  . CAD (coronary artery disease) 01/15/2012  . HTN (hypertension) 01/15/2012  . Dyslipidemia 01/15/2012  . COPD (chronic obstructive pulmonary disease); chronic bronchitis 01/15/2012  . Tobacco abuse 01/15/2012  . BMI 40.0-44.9, adult ([place]) 01/15/2012  . OSA (obstructive sleep apnea) 01/15/2012  . Insomnia 01/15/2012  . IBS (irritable bowel syndrome) 01/15/2012  . Hypogonadism male 01/15/2012  . DJD (degenerative joint disease) 01/15/2012  . Rotator cuff arthropathy, left 01/15/2012  . GERD (gastroesophageal reflux disease) 01/15/2012  . CHEST PAIN, ATYPICAL 07/12/2010  . TOBACCO USER 10/20/2009  . CAD, NATIVE VESSEL 09/29/2009  . EMPHYSEMA 09/21/2009  . DM 08/31/2009  . HYPERLIPIDEMIA 08/31/2009  . OBSTRUCTIVE SLEEP APNEA 08/31/2009  . Essential hypertension 08/31/2009  . CHRONIC OBSTRUCTIVE PULMONARY DISEASE, ACUTE EXACERBATION 08/31/2009  . DYSPNEA 08/31/2009    Darrel Hoover  PT 07/07/2019, 10:44 AM  [facility] [address] [place], [name], [address] Phone: [phone]   Fax:  [phone]  Name: PAISLEY MARCIN MRN: [id number] Date of Birth: 1946-08-01

## 2019-07-09 ENCOUNTER — Ambulatory Visit: Payer: PPO

## 2019-07-09 ENCOUNTER — Other Ambulatory Visit: Payer: Self-pay

## 2019-07-09 DIAGNOSIS — M6281 Muscle weakness (generalized): Secondary | ICD-10-CM

## 2019-07-09 DIAGNOSIS — Z96612 Presence of left artificial shoulder joint: Secondary | ICD-10-CM

## 2019-07-09 DIAGNOSIS — R29898 Other symptoms and signs involving the musculoskeletal system: Secondary | ICD-10-CM

## 2019-07-09 DIAGNOSIS — M25512 Pain in left shoulder: Secondary | ICD-10-CM

## 2019-07-09 NOTE — Therapy (Addendum)
[place], [name], [address] Phone: [phone]   Fax:  [phone]  Physical Therapy Treatment  Patient Details  Name: Justin Trujillo MRN: [id number] Date of Birth: Jan 10, 1946 Referring Provider (PT): Dr. Justice Britain   Progress Note Reporting Period  06/08/19 to 07/09/19  See note below for Objective Data and Assessment of Progress/Goals.      Encounter Date: 07/09/2019  PT End of Session - 07/09/19 1025    Visit Number  20    Number of Visits  28    Date for PT Re-Evaluation  08/07/19    Authorization Type  healt[employer] advantage    PT Start Time  1010    PT Stop Time  1043    PT Time Calculation (min)  33 min    Activity Tolerance  Patient tolerated treatment well;Patient limited by pain    Behavior During Therapy  [facility] for tasks assessed/performed       Past Medical History:  Diagnosis Date  . Arthritis   . Coronary atherosclerosis of native coronary artery   . Hypogonadism male   . Obstructive chronic bronchitis with exacerbation ([place])   . Obstructive sleep apnea (adult) (pediatric)    no cpap  . Other and unspecified hyperlipidemia   . Other emphysema ([place])   . Proteinuria 2019   has seen a nephrologist  . Shortness of breath    with excertion  . Type II or unspecified type diabetes mellitus without mention of complication, not stated as uncontrolled    type 2  . Unspecified essential hypertension     Past Surgical History:  Procedure Laterality Date  . CARDIAC CATHETERIZATION    . IR KYPHO LUMBAR INC FX REDUCE BONE BX UNI/BIL CANNULATION INC/IMAGING  06/13/2018  . L lens implant    . R knee replacement x2    . REVERSE SHOULDER ARTHROPLASTY Left 03/26/2019   Procedure: REVERSE SHOULDER ARTHROPLASTY;  Surgeon: Justice Britain, MD;  Location: [facility];  Service: Orthopedics;  Laterality: Left;  142min  . TONSILLECTOMY    . TONSILLECTOMY AND ADENOIDECTOMY    . vascectomy      There  were no vitals filed for this visit.  Subjective Assessment - 07/09/19 1030    Subjective  LT arm pain due to lifting a pot and having dog pull on Lt arm.    Pain Score  6     Pain Location  Shoulder    Pain Orientation  Left;Anterior    Pain Descriptors / Indicators  Aching    Pain Type  Acute pain;Surgical pain    Pain Onset  In the past 7 days    Pain Frequency  Constant    Aggravating Factors   using LT arm    Pain Relieving Factors  rest / heat / cold                       [facility] Adult PT Treatment/Exercise - 07/09/19 0001      Shoulder Exercises: Pulleys   Flexion  5 minutes      Modalities   Modalities  Moist Heat      Moist Heat Therapy   Number Minutes Moist Heat  10 Minutes    Moist Heat Location  Shoulder   LT     Manual Therapy   Soft tissue mobilization  Lt upper 1/4 including LT biceps .  PT Short Term Goals - 05/04/19 0848      PT SHORT TERM GOAL #1   Title  Pt will be I with initial HEP    Status  Achieved      PT SHORT TERM GOAL #2   Title  Pt will be able utilize RICE for home, self care and pain mgmt.    Status  Achieved      PT SHORT TERM GOAL #3   Title  Pt will be able to perform AAROM flexion and abduction to 110 deg in supine to work towards full function.    Status  Achieved      PT SHORT TERM GOAL #4   Title  Pt will wean out of sling as able per MD for >75% of the time    Baseline  out for 2 weeks    Status  Achieved        PT Long Term Goals - 07/02/19 1135      PT LONG TERM GOAL #1   Title  Pt will score < 40% impaired on FOTO to demonstrate functional mobility and use of dominant arm.    Baseline  45% limited  significant improvement    Status  On-going      PT LONG TERM GOAL #2   Title  Pt will be able to show 4+/5 strength in L UE or more for maximal function.    Baseline  does not appear he will reach 4+/5  for any groups    Status  On-going      PT LONG TERM GOAL #3   Title  Pt  will complete ADLs without lasting increased pain in L UE, dressing and overhead    Baseline  never severe    Status  On-going      PT LONG TERM GOAL #4   Title  Pt will be able to safely lift bowl, pot/pan with LUE for cooking, baking and use L UE to stir.    Baseline  needs some asssit with rT hand for safety    Status  On-going      PT LONG TERM GOAL #5   Title  Pt will be I with final HEP for L UE ROM and strengthening    Status  On-going      PT LONG TERM GOAL #6   Title  He will be able to reach behind buttok and hold LT hand with RT.    Status  On-going            Plan - 07/09/19 1027    Clinical Impression Statement  Sore and tender generally Lt shoulder but most tender LT bicep and tendon.    More comfortable post session . Asked him to rest for the next 2-3 days nad not to lift anything  heavy.    PT Treatment/Interventions  ADLs/Self Care Home Management;Cryotherapy;Electrical Stimulation;Moist Heat;Ultrasound;Functional mobility training;Manual techniques;Passive range of motion;Vasopneumatic Device;Taping;Therapeutic exercise;Iontophoresis 4mg /ml Dexamethasone;Therapeutic activities;Neuromuscular re-education;Patient/family education    PT Next Visit Plan  , cont ROM, strength , manual as needed ,  weights  as tolerated as long as pain eased next week,    PT Home Exercise Plan  table slides, pendulums, isometrics, cane flexion OK.  red band for ER,      behind back  stretch    Consulted and Agree with Plan of Care  Patient       Patient will benefit from skilled therapeutic intervention in order to improve the following deficits  and impairments:  Decreased range of motion, Decreased strength, Hypomobility, Decreased mobility, Decreased balance, Obesity, Postural dysfunction, Impaired flexibility, Impaired UE functional use, Pain  Visit Diagnosis: Muscle weakness (generalized)  Other symptoms and signs involving the musculoskeletal system  Acute pain of left  shoulder  S/P reverse total shoulder arthroplasty, left     Problem List Patient Active Problem List   Diagnosis Date Noted  . 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 Lumbar stenosis with neurogenic claudication 10/28/2018  . DM type 2 (diabetes mellitus, type 2) ([place]) 01/15/2012  . CAD (coronary artery disease) 01/15/2012  . HTN (hypertension) 01/15/2012  . Dyslipidemia 01/15/2012  . COPD (chronic obstructive pulmonary disease); chronic bronchitis 01/15/2012  . Tobacco abuse 01/15/2012  . BMI 40.0-44.9, adult ([place]) 01/15/2012  . OSA (obstructive sleep apnea) 01/15/2012  . Insomnia 01/15/2012  . IBS (irritable bowel syndrome) 01/15/2012  . Hypogonadism male 01/15/2012  . DJD (degenerative joint disease) 01/15/2012  . Rotator cuff arthropathy, left 01/15/2012  . GERD (gastroesophageal reflux disease) 01/15/2012  . CHEST PAIN, ATYPICAL 07/12/2010  . TOBACCO USER 10/20/2009  . CAD, NATIVE VESSEL 09/29/2009  . EMPHYSEMA 09/21/2009  . DM 08/31/2009  . HYPERLIPIDEMIA 08/31/2009  . OBSTRUCTIVE SLEEP APNEA 08/31/2009  . Essential hypertension 08/31/2009  . CHRONIC OBSTRUCTIVE PULMONARY DISEASE, ACUTE EXACERBATION 08/31/2009  . DYSPNEA 08/31/2009    Darrel Hoover  PT 07/09/2019, 10:31 AM  [facility] [address] [place], [name], [address] Phone: [phone]   Fax:  [phone]  Name: EULIS AHLQUIST MRN: [id number] Date of Birth: 1945/12/09

## 2019-07-14 ENCOUNTER — Ambulatory Visit: Payer: PPO

## 2019-07-14 ENCOUNTER — Other Ambulatory Visit: Payer: Self-pay

## 2019-07-14 DIAGNOSIS — M6281 Muscle weakness (generalized): Secondary | ICD-10-CM | POA: Diagnosis not present

## 2019-07-14 DIAGNOSIS — R29898 Other symptoms and signs involving the musculoskeletal system: Secondary | ICD-10-CM

## 2019-07-14 DIAGNOSIS — Z96612 Presence of left artificial shoulder joint: Secondary | ICD-10-CM

## 2019-07-14 NOTE — Therapy (Signed)
[place], [name], [address] Phone: [phone]   Fax:  [phone]  Physical Therapy Treatment  Patient Details  Name: Justin FRANCKE MRN: [id number] Date of Birth: [birth year] Referring Provider (PT): Dr. Justice Britain    Encounter Date: 07/14/2019  PT End of Session - 07/14/19 1005    Visit Number  21    Number of Visits  28    Date for PT Re-Evaluation  08/07/19    Authorization Type  healt[employer] advantage    PT Start Time  1000    PT Stop Time  1043    PT Time Calculation (min)  43 min    Activity Tolerance  Patient tolerated treatment well;Patient limited by pain    Behavior During Therapy  [facility] for tasks assessed/performed       Past Medical History:  Diagnosis Date  . Arthritis   . Coronary atherosclerosis of native coronary artery   . Hypogonadism male   . Obstructive chronic bronchitis with exacerbation ([place])   . Obstructive sleep apnea (adult) (pediatric)    no cpap  . Other and unspecified hyperlipidemia   . Other emphysema ([place])   . Proteinuria 2019   has seen a nephrologist  . Shortness of breath    with excertion  . Type II or unspecified type diabetes mellitus without mention of complication, not stated as uncontrolled    type 2  . Unspecified essential hypertension     Past Surgical History:  Procedure Laterality Date  . CARDIAC CATHETERIZATION    . IR KYPHO LUMBAR INC FX REDUCE BONE BX UNI/BIL CANNULATION INC/IMAGING  06/13/2018  . L lens implant    . R knee replacement x2    . REVERSE SHOULDER ARTHROPLASTY Left 03/26/2019   Procedure: REVERSE SHOULDER ARTHROPLASTY;  Surgeon: Justice Britain, MD;  Location: [facility];  Service: Orthopedics;  Laterality: Left;  17min  . TONSILLECTOMY    . TONSILLECTOMY AND ADENOIDECTOMY    . vascectomy      There were no vitals filed for this visit.  Subjective Assessment - 07/14/19 1040    Subjective  Pain much better .   Feel  can lift  more than I can.    Pain Score  2     Pain Location  Shoulder    Pain Orientation  Left;Anterior    Pain Descriptors / Indicators  Aching    Pain Type  Chronic pain    Pain Onset  More than a month ago    Pain Frequency  Intermittent    Aggravating Factors   using Lt arm    Pain Relieving Factors  reat/ heat [place]         [facility] PT Assessment - 07/14/19 0001      AROM   Left Shoulder Flexion  125 Degrees    Left Shoulder ABduction  145 Degrees    Left Shoulder External Rotation  80 Degrees   arm supported at 90 degrees  abduction                  [facility] Adult PT Treatment/Exercise - 07/14/19 0001      Shoulder Exercises: Seated   Elevation  Left;15 reps    Elevation Limitations  overhead press x 10 2#  x10 1#  then 3 sets of 5 with 1#    Abduction  Left;10 reps    ABduction Weight (lbs)  1    Other Seated Exercises  Spent 15 min  working on isometrics adn 90 degrees trying to keep scapula from upward rotation to assist. deltoids.    did  in abduction/scaption and flexion and as fatigued  did with bent elbow to 90 degrees      Shoulder Exercises: ROM/Strengthening   UBE (Upper Arm Bike)  L2 3 min for 3 min back               PT Short Term Goals - 05/04/19 0848      PT SHORT TERM GOAL #1   Title  Pt will be I with initial HEP    Status  Achieved      PT SHORT TERM GOAL #2   Title  Pt will be able utilize RICE for home, self care and pain mgmt.    Status  Achieved      PT SHORT TERM GOAL #3   Title  Pt will be able to perform AAROM flexion and abduction to 110 deg in supine to work towards full function.    Status  Achieved      PT SHORT TERM GOAL #4   Title  Pt will wean out of sling as able per MD for >75% of the time    Baseline  out for 2 weeks    Status  Achieved        PT Long Term Goals - 07/02/19 1135      PT LONG TERM GOAL #1   Title  Pt will score < 40% impaired on FOTO to demonstrate functional mobility and use of dominant arm.     Baseline  45% limited  significant improvement    Status  On-going      PT LONG TERM GOAL #2   Title  Pt will be able to show 4+/5 strength in L UE or more for maximal function.    Baseline  does not appear he will reach 4+/5  for any groups    Status  On-going      PT LONG TERM GOAL #3   Title  Pt will complete ADLs without lasting increased pain in L UE, dressing and overhead    Baseline  never severe    Status  On-going      PT LONG TERM GOAL #4   Title  Pt will be able to safely lift bowl, pot/pan with LUE for cooking, baking and use L UE to stir.    Baseline  needs some asssit with rT hand for safety    Status  On-going      PT LONG TERM GOAL #5   Title  Pt will be I with final HEP for L UE ROM and strengthening    Status  On-going      PT LONG TERM GOAL #6   Title  He will be able to reach behind buttok and hold LT hand with RT.    Status  On-going            Plan - 07/14/19 1006    Clinical Impression Statement  Active  ROM is good . Weakness still a big problem affeting function. Shifted back to isometrics  and AAROM for strength .    PT Treatment/Interventions  ADLs/Self Care Home Management;Cryotherapy;Electrical Stimulation;Moist Heat;Ultrasound;Functional mobility training;Manual techniques;Passive range of motion;Vasopneumatic Device;Taping;Therapeutic exercise;Iontophoresis 4mg /ml Dexamethasone;Therapeutic activities;Neuromuscular re-education;Patient/family education    PT Next Visit Plan  , cont strength , manual as needed ,  weights  as tolerated    isometrics  placed in space  PT Home Exercise Plan  table slides, pendulums, isometrics, cane flexion OK.  red band for ER,      behind back  stretch    Consulted and Agree with Plan of Care  Patient       Patient will benefit from skilled therapeutic intervention in order to improve the following deficits and impairments:  Decreased range of motion, Decreased strength, Hypomobility, Decreased mobility,  Decreased balance, Obesity, Postural dysfunction, Impaired flexibility, Impaired UE functional use, Pain  Visit Diagnosis: Muscle weakness (generalized)  Other symptoms and signs involving the musculoskeletal system  S/P reverse total shoulder arthroplasty, left     Problem List Patient Active Problem List   Diagnosis Date Noted  . 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 Lumbar stenosis with neurogenic claudication 10/28/2018  . DM type 2 (diabetes mellitus, type 2) ([place]) 01/15/2012  . CAD (coronary artery disease) 01/15/2012  . HTN (hypertension) 01/15/2012  . Dyslipidemia 01/15/2012  . COPD (chronic obstructive pulmonary disease); chronic bronchitis 01/15/2012  . Tobacco abuse 01/15/2012  . BMI 40.0-44.9, adult ([place]) 01/15/2012  . OSA (obstructive sleep apnea) 01/15/2012  . Insomnia 01/15/2012  . IBS (irritable bowel syndrome) 01/15/2012  . Hypogonadism male 01/15/2012  . DJD (degenerative joint disease) 01/15/2012  . Rotator cuff arthropathy, left 01/15/2012  . GERD (gastroesophageal reflux disease) 01/15/2012  . CHEST PAIN, ATYPICAL 07/12/2010  . TOBACCO USER 10/20/2009  . CAD, NATIVE VESSEL 09/29/2009  . EMPHYSEMA 09/21/2009  . DM 08/31/2009  . HYPERLIPIDEMIA 08/31/2009  . OBSTRUCTIVE SLEEP APNEA 08/31/2009  . Essential hypertension 08/31/2009  . CHRONIC OBSTRUCTIVE PULMONARY DISEASE, ACUTE EXACERBATION 08/31/2009  . DYSPNEA 08/31/2009    Darrel Hoover   PT 07/14/2019, 10:42 AM  [facility] [address] [place], [name], [address] Phone: [phone]   Fax:  [phone]  Name: SHAWNDELL MALLERNEE MRN: [id number] Date of Birth: Dec 02, 1945

## 2019-07-16 ENCOUNTER — Ambulatory Visit: Payer: PPO

## 2019-07-21 ENCOUNTER — Other Ambulatory Visit: Payer: Self-pay

## 2019-07-21 ENCOUNTER — Ambulatory Visit: Payer: PPO | Attending: Orthopedic Surgery

## 2019-07-21 DIAGNOSIS — M25512 Pain in left shoulder: Secondary | ICD-10-CM | POA: Diagnosis not present

## 2019-07-21 DIAGNOSIS — Z96612 Presence of left artificial shoulder joint: Secondary | ICD-10-CM | POA: Insufficient documentation

## 2019-07-21 DIAGNOSIS — R29898 Other symptoms and signs involving the musculoskeletal system: Secondary | ICD-10-CM | POA: Diagnosis not present

## 2019-07-21 DIAGNOSIS — M6281 Muscle weakness (generalized): Secondary | ICD-10-CM | POA: Diagnosis not present

## 2019-07-21 NOTE — Therapy (Signed)
[place], [name], [address] Phone: [phone]   Fax:  [phone]  Physical Therapy Treatment  Patient Details  Name: Justin Trujillo MRN: [id number] Date of Birth: 10/28/1945 Referring Provider (PT): Dr. Justice Britain    Encounter Date: 07/21/2019  PT End of Session - 07/21/19 1008    Visit Number  22    Number of Visits  28    Date for PT Re-Evaluation  08/07/19    Authorization Type  healt[employer]    PT Start Time  1007   late   PT Stop Time  1045    PT Time Calculation (min)  38 min    Activity Tolerance  Patient tolerated treatment well;Patient limited by pain       Past Medical History:  Diagnosis Date  . Arthritis   . Coronary atherosclerosis of native coronary artery   . Hypogonadism male   . Obstructive chronic bronchitis with exacerbation ([place])   . Obstructive sleep apnea (adult) (pediatric)    no cpap  . Other and unspecified hyperlipidemia   . Other emphysema ([place])   . Proteinuria 2019   has seen a nephrologist  . Shortness of breath    with excertion  . Type II or unspecified type diabetes mellitus without mention of complication, not stated as uncontrolled    type 2  . Unspecified essential hypertension     Past Surgical History:  Procedure Laterality Date  . CARDIAC CATHETERIZATION    . IR KYPHO LUMBAR INC FX REDUCE BONE BX UNI/BIL CANNULATION INC/IMAGING  06/13/2018  . L lens implant    . R knee replacement x2    . REVERSE SHOULDER ARTHROPLASTY Left 03/26/2019   Procedure: REVERSE SHOULDER ARTHROPLASTY;  Surgeon: Justice Britain, MD;  [facility];  Service: Orthopedics;  Laterality: Left;  122min  . TONSILLECTOMY    . TONSILLECTOMY AND ADENOIDECTOMY    . vascectomy      There were no vitals filed for this visit.  Subjective Assessment - 07/21/19 1010    Subjective  Shoulder pain minimal today    Pain Score  2     Pain Location  Shoulder    Pain Orientation   Left;Anterior    Pain Descriptors / Indicators  Aching    Pain Type  Chronic pain    Pain Onset  More than a month ago    Pain Frequency  Intermittent    Aggravating Factors   using LT arm    Pain Relieving Factors  heat/ cold/ rest                       [facility] Adult PT Treatment/Exercise - 07/21/19 0001      Shoulder Exercises: Seated   Elevation Limitations  overhead press 2 x 10     Abduction  Left;10 reps    ABduction Limitations  Woked more in scaption and did 20 reps with more punching movement with 1# x 20    Other Seated Exercises  Spent 10 min working on isometrics at 90 degrees trying to keep scapula from upward rotation to assist. deltoids.    did  in abduction/scaption and flexion and as fatigued  did with bent elbow to 90 degrees   Bestr hold time was 12 sec in flexion and 7-8 sec in scaption and abduciton    Other Seated Exercises  bicep curls 6# x 40 reps then blue  band downward press for  triceps x 50      Shoulder Exercises: Pulleys   Flexion  3 minutes               PT Short Term Goals - 05/04/19 0848      PT SHORT TERM GOAL #1   Title  Pt will be I with initial HEP    Status  Achieved      PT SHORT TERM GOAL #2   Title  Pt will be able utilize RICE for home, self care and pain mgmt.    Status  Achieved      PT SHORT TERM GOAL #3   Title  Pt will be able to perform AAROM flexion and abduction to 110 deg in supine to work towards full function.    Status  Achieved      PT SHORT TERM GOAL #4   Title  Pt will wean out of sling as able per MD for >75% of the time    Baseline  out for 2 weeks    Status  Achieved        PT Long Term Goals - 07/02/19 1135      PT LONG TERM GOAL #1   Title  Pt will score < 40% impaired on FOTO to demonstrate functional mobility and use of dominant arm.    Baseline  45% limited  significant improvement    Status  On-going      PT LONG TERM GOAL #2   Title  Pt will be able to show 4+/5 strength in L  UE or more for maximal function.    Baseline  does not appear he will reach 4+/5  for any groups    Status  On-going      PT LONG TERM GOAL #3   Title  Pt will complete ADLs without lasting increased pain in L UE, dressing and overhead    Baseline  never severe    Status  On-going      PT LONG TERM GOAL #4   Title  Pt will be able to safely lift bowl, pot/pan with LUE for cooking, baking and use L UE to stir.    Baseline  needs some asssit with rT hand for safety    Status  On-going      PT LONG TERM GOAL #5   Title  Pt will be I with final HEP for L UE ROM and strengthening    Status  On-going      PT LONG TERM GOAL #6   Title  He will be able to reach behind buttok and hold LT hand with RT.    Status  On-going            Plan - 07/21/19 1008    Clinical Impression Statement  Actually appeared tro hold longer and tolerated 2# overhead for increased reps.    PT Treatment/Interventions  ADLs/Self Care Home Management;Cryotherapy;Electrical Stimulation;Moist Heat;Ultrasound;Functional mobility training;Manual techniques;Passive range of motion;Vasopneumatic Device;Taping;Therapeutic exercise;Iontophoresis 4mg /ml Dexamethasone;Therapeutic activities;Neuromuscular re-education;Patient/family education    PT Next Visit Plan  , cont strength , manual as needed ,  weights  as tolerated    isometrics  placed in space    PT Home Exercise Plan  table slides, pendulums, isometrics, cane flexion OK.  red band for ER,      behind back  stretch    Consulted and Agree with Plan of Care  Patient       Patient will benefit from skilled therapeutic intervention in  order to improve the following deficits and impairments:  Decreased range of motion, Decreased strength, Hypomobility, Decreased mobility, Decreased balance, Obesity, Postural dysfunction, Impaired flexibility, Impaired UE functional use, Pain  Visit Diagnosis: Muscle weakness (generalized)  S/P reverse total shoulder arthroplasty,  left     Problem List Patient Active Problem List   Diagnosis Date Noted  . 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 Lumbar stenosis with neurogenic claudication 10/28/2018  . DM type 2 (diabetes mellitus, type 2) ([place]) 01/15/2012  . CAD (coronary artery disease) 01/15/2012  . HTN (hypertension) 01/15/2012  . Dyslipidemia 01/15/2012  . COPD (chronic obstructive pulmonary disease); chronic bronchitis 01/15/2012  . Tobacco abuse 01/15/2012  . BMI 40.0-44.9, adult ([place]) 01/15/2012  . OSA (obstructive sleep apnea) 01/15/2012  . Insomnia 01/15/2012  . IBS (irritable bowel syndrome) 01/15/2012  . Hypogonadism male 01/15/2012  . DJD (degenerative joint disease) 01/15/2012  . Rotator cuff arthropathy, left 01/15/2012  . GERD (gastroesophageal reflux disease) 01/15/2012  . CHEST PAIN, ATYPICAL 07/12/2010  . TOBACCO USER 10/20/2009  . CAD, NATIVE VESSEL 09/29/2009  . EMPHYSEMA 09/21/2009  . DM 08/31/2009  . HYPERLIPIDEMIA 08/31/2009  . OBSTRUCTIVE SLEEP APNEA 08/31/2009  . Essential hypertension 08/31/2009  . CHRONIC OBSTRUCTIVE PULMONARY DISEASE, ACUTE EXACERBATION 08/31/2009  . DYSPNEA 08/31/2009    Darrel Hoover  PT 07/21/2019, 10:48 AM  [facility] [address] [place], [name], [address] Phone: [phone]   Fax:  [phone]  Name: BOAZ MOSKO MRN: [id number] Date of Birth: Dec 13, 1945

## 2019-07-24 ENCOUNTER — Ambulatory Visit: Payer: PPO | Admitting: Physical Therapy

## 2019-07-24 ENCOUNTER — Other Ambulatory Visit: Payer: Self-pay

## 2019-07-24 ENCOUNTER — Encounter: Payer: Self-pay | Admitting: Physical Therapy

## 2019-07-24 DIAGNOSIS — M25512 Pain in left shoulder: Secondary | ICD-10-CM

## 2019-07-24 DIAGNOSIS — M6281 Muscle weakness (generalized): Secondary | ICD-10-CM | POA: Diagnosis not present

## 2019-07-24 DIAGNOSIS — R29898 Other symptoms and signs involving the musculoskeletal system: Secondary | ICD-10-CM

## 2019-07-24 DIAGNOSIS — Z96612 Presence of left artificial shoulder joint: Secondary | ICD-10-CM

## 2019-07-24 NOTE — Therapy (Addendum)
[place], [name], [address] Phone: [phone]   Fax:  [phone]  Physical Therapy Treatment/Discharge  Patient Details  Name: Justin Trujillo MRN: [id number] Date of Birth: July 07, 1946 Referring Provider (PT): Dr. Justice Britain    Encounter Date: 07/24/2019  PT End of Session - 07/24/19 0923    Visit Number  23    Number of Visits  28    Date for PT Re-Evaluation  08/07/19    Authorization Type  healthteam advantage    PT Start Time  0920    PT Stop Time  0958    PT Time Calculation (min)  38 min    Activity Tolerance  Patient tolerated treatment well;Patient limited by pain    Behavior During Therapy  [facility] for tasks assessed/performed       Past Medical History:  Diagnosis Date  . Arthritis   . Coronary atherosclerosis of native coronary artery   . Hypogonadism male   . Obstructive chronic bronchitis with exacerbation ([place])   . Obstructive sleep apnea (adult) (pediatric)    no cpap  . Other and unspecified hyperlipidemia   . Other emphysema ([place])   . Proteinuria 2019   has seen a nephrologist  . Shortness of breath    with excertion  . Type II or unspecified type diabetes mellitus without mention of complication, not stated as uncontrolled    type 2  . Unspecified essential hypertension     Past Surgical History:  Procedure Laterality Date  . CARDIAC CATHETERIZATION    . IR KYPHO LUMBAR INC FX REDUCE BONE BX UNI/BIL CANNULATION INC/IMAGING  06/13/2018  . L lens implant    . R knee replacement x2    . REVERSE SHOULDER ARTHROPLASTY Left 03/26/2019   Procedure: REVERSE SHOULDER ARTHROPLASTY;  Surgeon: Justice Britain, MD;  Location: [facility];  Service: Orthopedics;  Laterality: Left;  148mn  . TONSILLECTOMY    . TONSILLECTOMY AND ADENOIDECTOMY    . vascectomy      There were no vitals filed for this visit.  Subjective Assessment - 07/24/19 0923    Subjective  Sore today. Its been a rough  week.  My wife is sick.  Ive had to do some more things than Im used to. Have been cooking and cleaning and taking care of dogs.    Currently in Pain?  Yes    Pain Score  4     Pain Location  Shoulder    Pain Orientation  Left;Anterior    Pain Descriptors / Indicators  Aching;Sore    Pain Type  Chronic pain    Pain Onset  More than a month ago    Pain Frequency  Intermittent    Aggravating Factors   using L Arm    Pain Relieving Factors  heat, cold , rest    Multiple Pain Sites  No         [facility] PT Assessment - 07/24/19 0001      Strength   Left Shoulder External Rotation  4-/5        [facility] Adult PT Treatment/Exercise - 07/24/19 0001      Elbow Exercises   Elbow Flexion  Strengthening;Left;20 reps   4 lb     Shoulder Exercises: Supine   External Rotation  Strengthening;Left;10 reps    Theraband Level (Shoulder External Rotation)  Level 3 (Green)    External Rotation Weight (lbs)  cues for form     Flexion  Strengthening;Left;10  reps;Limitations;Other (comment)    Shoulder Flexion Weight (lbs)  2   2 sets punch and then elbow ext      Shoulder Exercises: Seated   Elevation Limitations  overhead press 1 x 10    2 lbs , 5 sec hold    Flexion  Strengthening;Left;10 reps    Flexion Weight (lbs)  2 (bent elbow press)     Abduction  Strengthening;Left;10 reps    ABduction Weight (lbs)  2   hold 5 sec    Other Seated Exercises  chest press 4 lbs x 10       Shoulder Exercises: Pulleys   Flexion  3 minutes    Other Pulley Exercises  horizontal abd and scaption 1 min each       Manual Therapy   Passive ROM  all planes             PT Short Term Goals - 05/04/19 0848      PT SHORT TERM GOAL #1   Title  Pt will be I with initial HEP    Status  Achieved      PT SHORT TERM GOAL #2   Title  Pt will be able utilize RICE for home, self care and pain mgmt.    Status  Achieved      PT SHORT TERM GOAL #3   Title  Pt will be able to perform AAROM flexion and abduction  to 110 deg in supine to work towards full function.    Status  Achieved      PT SHORT TERM GOAL #4   Title  Pt will wean out of sling as able per MD for >75% of the time    Baseline  out for 2 weeks    Status  Achieved        PT Long Term Goals - 07/02/19 1135      PT LONG TERM GOAL #1   Title  Pt will score < 40% impaired on FOTO to demonstrate functional mobility and use of dominant arm.    Baseline  45% limited  significant improvement    Status  On-going      PT LONG TERM GOAL #2   Title  Pt will be able to show 4+/5 strength in L UE or more for maximal function.    Baseline  does not appear he will reach 4+/5  for any groups    Status  On-going      PT LONG TERM GOAL #3   Title  Pt will complete ADLs without lasting increased pain in L UE, dressing and overhead    Baseline  never severe    Status  On-going      PT LONG TERM GOAL #4   Title  Pt will be able to safely lift bowl, pot/pan with LUE for cooking, baking and use L UE to stir.    Baseline  needs some asssit with rT hand for safety    Status  On-going      PT LONG TERM GOAL #5   Title  Pt will be I with final HEP for L UE ROM and strengthening    Status  On-going      PT LONG TERM GOAL #6   Title  He will be able to reach behind buttok and hold LT hand with RT.    Status  On-going            Plan - 07/24/19 1417    Clinical  Impression Statement  Patient had increased pain today, has overdone things around the house this week.  He knows he needed to pace himself but admits to doing it anyway.  Session was short becaused he needed trhe bathroom and then requested to leave.  Declined ice.  Hopeful that he can resume normal UE activity next week after some rest.       Patient will benefit from skilled therapeutic intervention in order to improve the following deficits and impairments:  Decreased range of motion, Decreased strength, Hypomobility, Decreased mobility, Decreased balance, Obesity, Postural  dysfunction, Impaired flexibility, Impaired UE functional use, Pain  Visit Diagnosis: Muscle weakness (generalized)  S/P reverse total shoulder arthroplasty, left  Other symptoms and signs involving the musculoskeletal system  Acute pain of left shoulder     Problem List Patient Active Problem List   Diagnosis Date Noted  . 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 Lumbar stenosis with neurogenic claudication 10/28/2018  . DM type 2 (diabetes mellitus, type 2) ([place]) 01/15/2012  . CAD (coronary artery disease) 01/15/2012  . HTN (hypertension) 01/15/2012  . Dyslipidemia 01/15/2012  . COPD (chronic obstructive pulmonary disease); chronic bronchitis 01/15/2012  . Tobacco abuse 01/15/2012  . BMI 40.0-44.9, adult ([place]) 01/15/2012  . OSA (obstructive sleep apnea) 01/15/2012  . Insomnia 01/15/2012  . IBS (irritable bowel syndrome) 01/15/2012  . Hypogonadism male 01/15/2012  . DJD (degenerative joint disease) 01/15/2012  . Rotator cuff arthropathy, left 01/15/2012  . GERD (gastroesophageal reflux disease) 01/15/2012  . CHEST PAIN, ATYPICAL 07/12/2010  . TOBACCO USER 10/20/2009  . CAD, NATIVE VESSEL 09/29/2009  . EMPHYSEMA 09/21/2009  . DM 08/31/2009  . HYPERLIPIDEMIA 08/31/2009  . OBSTRUCTIVE SLEEP APNEA 08/31/2009  . Essential hypertension 08/31/2009  . CHRONIC OBSTRUCTIVE PULMONARY DISEASE, ACUTE EXACERBATION 08/31/2009  . DYSPNEA 08/31/2009    Blake Vetrano 07/24/2019, 2:32 PM  [facility] [address] [place], [name], [address] Phone: [phone]   Fax:  [phone]  Name: VIRGIL LIGHTNER MRN: [id number] Date of Birth: 04-21-1946   PHYSICAL THERAPY DISCHARGE SUMMARY  Visits from Start of Care: 23  Current functional level related to goals / functional outcomes: See above for most recent    Remaining deficits: See above    Education / Equipment: HEP, pain control, RICE , posture   Plan: Patient agrees to discharge.  Patient goals were partially met. Patient is being discharged due to not returning since the last visit.  ?????     Raeford Razor, PT 07/24/19 2:32 PM Phone: [phone] Fax: [phone]

## 2019-07-27 ENCOUNTER — Ambulatory Visit: Payer: PPO

## 2019-07-30 ENCOUNTER — Ambulatory Visit: Payer: PPO

## 2019-08-03 ENCOUNTER — Ambulatory Visit: Payer: PPO

## 2019-08-04 ENCOUNTER — Other Ambulatory Visit: Payer: Self-pay

## 2019-08-04 DIAGNOSIS — Z20822 Contact with and (suspected) exposure to covid-19: Secondary | ICD-10-CM

## 2019-08-05 LAB — NOVEL CORONAVIRUS, NAA: SARS-CoV-2, NAA: NOT DETECTED

## 2019-08-06 ENCOUNTER — Ambulatory Visit: Payer: PPO

## 2019-09-07 DIAGNOSIS — Z471 Aftercare following joint replacement surgery: Secondary | ICD-10-CM | POA: Diagnosis not present

## 2019-09-07 DIAGNOSIS — Z96612 Presence of left artificial shoulder joint: Secondary | ICD-10-CM | POA: Diagnosis not present

## 2019-09-08 DIAGNOSIS — Z23 Encounter for immunization: Secondary | ICD-10-CM | POA: Diagnosis not present

## 2019-09-25 DIAGNOSIS — M549 Dorsalgia, unspecified: Secondary | ICD-10-CM | POA: Diagnosis not present

## 2019-09-29 DIAGNOSIS — M549 Dorsalgia, unspecified: Secondary | ICD-10-CM | POA: Diagnosis not present

## 2019-10-02 DIAGNOSIS — M549 Dorsalgia, unspecified: Secondary | ICD-10-CM | POA: Diagnosis not present

## 2019-10-05 DIAGNOSIS — G4700 Insomnia, unspecified: Secondary | ICD-10-CM | POA: Diagnosis not present

## 2019-10-05 DIAGNOSIS — E785 Hyperlipidemia, unspecified: Secondary | ICD-10-CM | POA: Diagnosis not present

## 2019-10-05 DIAGNOSIS — Z0000 Encounter for general adult medical examination without abnormal findings: Secondary | ICD-10-CM | POA: Diagnosis not present

## 2019-10-05 DIAGNOSIS — G8929 Other chronic pain: Secondary | ICD-10-CM | POA: Diagnosis not present

## 2019-10-05 DIAGNOSIS — E1121 Type 2 diabetes mellitus with diabetic nephropathy: Secondary | ICD-10-CM | POA: Diagnosis not present

## 2019-10-05 DIAGNOSIS — Z1389 Encounter for screening for other disorder: Secondary | ICD-10-CM | POA: Diagnosis not present

## 2019-10-05 DIAGNOSIS — F17200 Nicotine dependence, unspecified, uncomplicated: Secondary | ICD-10-CM | POA: Diagnosis not present

## 2019-10-05 DIAGNOSIS — I10 Essential (primary) hypertension: Secondary | ICD-10-CM | POA: Diagnosis not present

## 2019-10-06 DIAGNOSIS — M549 Dorsalgia, unspecified: Secondary | ICD-10-CM | POA: Diagnosis not present

## 2019-10-08 DIAGNOSIS — M549 Dorsalgia, unspecified: Secondary | ICD-10-CM | POA: Diagnosis not present

## 2019-10-15 DIAGNOSIS — R350 Frequency of micturition: Secondary | ICD-10-CM | POA: Diagnosis not present

## 2019-10-15 DIAGNOSIS — Z125 Encounter for screening for malignant neoplasm of prostate: Secondary | ICD-10-CM | POA: Diagnosis not present

## 2019-10-15 DIAGNOSIS — R3915 Urgency of urination: Secondary | ICD-10-CM | POA: Diagnosis not present

## 2019-10-15 DIAGNOSIS — N401 Enlarged prostate with lower urinary tract symptoms: Secondary | ICD-10-CM | POA: Diagnosis not present

## 2019-10-15 DIAGNOSIS — E291 Testicular hypofunction: Secondary | ICD-10-CM | POA: Diagnosis not present

## 2019-10-16 DIAGNOSIS — M1712 Unilateral primary osteoarthritis, left knee: Secondary | ICD-10-CM | POA: Diagnosis not present

## 2019-10-16 DIAGNOSIS — M25562 Pain in left knee: Secondary | ICD-10-CM | POA: Diagnosis not present

## 2019-10-16 DIAGNOSIS — M549 Dorsalgia, unspecified: Secondary | ICD-10-CM | POA: Diagnosis not present

## 2019-10-19 DIAGNOSIS — M25562 Pain in left knee: Secondary | ICD-10-CM | POA: Diagnosis not present

## 2019-10-20 DIAGNOSIS — Z961 Presence of intraocular lens: Secondary | ICD-10-CM | POA: Diagnosis not present

## 2019-10-20 DIAGNOSIS — H18413 Arcus senilis, bilateral: Secondary | ICD-10-CM | POA: Diagnosis not present

## 2019-10-20 DIAGNOSIS — H2511 Age-related nuclear cataract, right eye: Secondary | ICD-10-CM | POA: Diagnosis not present

## 2019-10-20 DIAGNOSIS — H25011 Cortical age-related cataract, right eye: Secondary | ICD-10-CM | POA: Diagnosis not present

## 2019-10-23 ENCOUNTER — Ambulatory Visit (HOSPITAL_COMMUNITY)
Admission: RE | Admit: 2019-10-23 | Discharge: 2019-10-23 | Disposition: A | Discharge status: Home / Self Care | Payer: PPO | Source: Ambulatory Visit | Attending: Specialist | Admitting: Specialist

## 2019-10-23 ENCOUNTER — Other Ambulatory Visit (HOSPITAL_COMMUNITY): Payer: Self-pay | Admitting: Specialist

## 2019-10-23 ENCOUNTER — Other Ambulatory Visit: Payer: Self-pay

## 2019-10-23 DIAGNOSIS — M79605 Pain in left leg: Secondary | ICD-10-CM | POA: Diagnosis not present

## 2019-10-23 DIAGNOSIS — M79662 Pain in left lower leg: Secondary | ICD-10-CM | POA: Diagnosis not present

## 2019-10-23 DIAGNOSIS — M79604 Pain in right leg: Secondary | ICD-10-CM | POA: Diagnosis not present

## 2019-10-23 DIAGNOSIS — M79661 Pain in right lower leg: Secondary | ICD-10-CM | POA: Diagnosis not present

## 2019-10-23 DIAGNOSIS — M7989 Other specified soft tissue disorders: Secondary | ICD-10-CM | POA: Insufficient documentation

## 2019-10-23 DIAGNOSIS — M25562 Pain in left knee: Secondary | ICD-10-CM | POA: Diagnosis not present

## 2019-10-23 NOTE — Progress Notes (Signed)
Left lower extremity venous duplex has been completed. Preliminary results can be found in CV Proc through chart review.  Results were faxed to Dr. Tonita Cong.  10/23/19 4:55 PM Carlos Levering RVT

## 2019-11-02 DIAGNOSIS — H2511 Age-related nuclear cataract, right eye: Secondary | ICD-10-CM | POA: Diagnosis not present

## 2019-11-02 DIAGNOSIS — Z961 Presence of intraocular lens: Secondary | ICD-10-CM | POA: Diagnosis not present

## 2019-11-18 DIAGNOSIS — M4150 Other secondary scoliosis, site unspecified: Secondary | ICD-10-CM | POA: Diagnosis not present

## 2019-11-26 DIAGNOSIS — E291 Testicular hypofunction: Secondary | ICD-10-CM | POA: Diagnosis not present

## 2019-11-30 DIAGNOSIS — M25562 Pain in left knee: Secondary | ICD-10-CM | POA: Diagnosis not present

## 2019-11-30 DIAGNOSIS — M549 Dorsalgia, unspecified: Secondary | ICD-10-CM | POA: Diagnosis not present

## 2019-12-03 DIAGNOSIS — R350 Frequency of micturition: Secondary | ICD-10-CM | POA: Diagnosis not present

## 2019-12-03 DIAGNOSIS — E291 Testicular hypofunction: Secondary | ICD-10-CM | POA: Diagnosis not present

## 2019-12-03 DIAGNOSIS — R8279 Other abnormal findings on microbiological examination of urine: Secondary | ICD-10-CM | POA: Diagnosis not present

## 2019-12-04 DIAGNOSIS — M25562 Pain in left knee: Secondary | ICD-10-CM | POA: Diagnosis not present

## 2019-12-04 DIAGNOSIS — M549 Dorsalgia, unspecified: Secondary | ICD-10-CM | POA: Diagnosis not present

## 2019-12-07 DIAGNOSIS — M549 Dorsalgia, unspecified: Secondary | ICD-10-CM | POA: Diagnosis not present

## 2019-12-07 DIAGNOSIS — M25562 Pain in left knee: Secondary | ICD-10-CM | POA: Diagnosis not present

## 2019-12-11 DIAGNOSIS — M25562 Pain in left knee: Secondary | ICD-10-CM | POA: Diagnosis not present

## 2019-12-11 DIAGNOSIS — M549 Dorsalgia, unspecified: Secondary | ICD-10-CM | POA: Diagnosis not present

## 2019-12-14 ENCOUNTER — Ambulatory Visit: Payer: PPO | Attending: Internal Medicine

## 2019-12-14 DIAGNOSIS — Z23 Encounter for immunization: Secondary | ICD-10-CM

## 2019-12-14 NOTE — Progress Notes (Signed)
   Covid-19 Vaccination Clinic  Name:  Justin Trujillo    MRN: [id number] DOB: 1945-12-25  12/14/2019  Justin Trujillo was observed post Covid-19 immunization for 30 minutes based on pre-vaccination screening without incident. He was provided with Vaccine Information Sheet and instruction to access the V-Safe system.   Justin Trujillo was instructed to call 911 with any severe reactions post vaccine: Marland Kitchen Difficulty breathing  . Swelling of face and throat  . A fast heartbeat  . A bad rash all over body  . Dizziness and weakness   Immunizations Administered    Name Date Dose VIS Date Route   Pfizer COVID-19 Vaccine 12/14/2019 11:56 AM 0.3 mL 08/28/2019 Intramuscular   Manufacturer: [place]   Lot: [id number]   [place]: [id number]

## 2019-12-15 DIAGNOSIS — I10 Essential (primary) hypertension: Secondary | ICD-10-CM | POA: Diagnosis not present

## 2019-12-15 DIAGNOSIS — F324 Major depressive disorder, single episode, in partial remission: Secondary | ICD-10-CM | POA: Diagnosis not present

## 2019-12-15 DIAGNOSIS — G8929 Other chronic pain: Secondary | ICD-10-CM | POA: Diagnosis not present

## 2019-12-15 DIAGNOSIS — F17200 Nicotine dependence, unspecified, uncomplicated: Secondary | ICD-10-CM | POA: Diagnosis not present

## 2019-12-15 DIAGNOSIS — E785 Hyperlipidemia, unspecified: Secondary | ICD-10-CM | POA: Diagnosis not present

## 2019-12-15 DIAGNOSIS — E1121 Type 2 diabetes mellitus with diabetic nephropathy: Secondary | ICD-10-CM | POA: Diagnosis not present

## 2019-12-15 DIAGNOSIS — G250 Essential tremor: Secondary | ICD-10-CM | POA: Diagnosis not present

## 2019-12-17 DIAGNOSIS — M549 Dorsalgia, unspecified: Secondary | ICD-10-CM | POA: Diagnosis not present

## 2019-12-17 DIAGNOSIS — M25562 Pain in left knee: Secondary | ICD-10-CM | POA: Diagnosis not present

## 2019-12-24 DIAGNOSIS — M25562 Pain in left knee: Secondary | ICD-10-CM | POA: Diagnosis not present

## 2019-12-24 DIAGNOSIS — M549 Dorsalgia, unspecified: Secondary | ICD-10-CM | POA: Diagnosis not present

## 2019-12-28 ENCOUNTER — Ambulatory Visit: Payer: PPO

## 2019-12-29 DIAGNOSIS — M25562 Pain in left knee: Secondary | ICD-10-CM | POA: Diagnosis not present

## 2019-12-29 DIAGNOSIS — M549 Dorsalgia, unspecified: Secondary | ICD-10-CM | POA: Diagnosis not present

## 2020-01-01 DIAGNOSIS — M25562 Pain in left knee: Secondary | ICD-10-CM | POA: Diagnosis not present

## 2020-01-01 DIAGNOSIS — M549 Dorsalgia, unspecified: Secondary | ICD-10-CM | POA: Diagnosis not present

## 2020-01-05 DIAGNOSIS — M25562 Pain in left knee: Secondary | ICD-10-CM | POA: Diagnosis not present

## 2020-01-05 DIAGNOSIS — M549 Dorsalgia, unspecified: Secondary | ICD-10-CM | POA: Diagnosis not present

## 2020-01-06 ENCOUNTER — Ambulatory Visit: Payer: PPO | Attending: Internal Medicine

## 2020-01-06 DIAGNOSIS — Z23 Encounter for immunization: Secondary | ICD-10-CM

## 2020-01-06 NOTE — Progress Notes (Signed)
   Covid-19 Vaccination Clinic  Name:  KHA ORBIN    MRN: [id number] DOB: [birth year]  01/06/2020  Mr. Mcphearson was observed post Covid-19 immunization for 30 minutes based on pre-vaccination screening without incident. He was provided with Vaccine Information Sheet and instruction to access the V-Safe system.   Mr. Yudin was instructed to call 911 with any severe reactions post vaccine: Marland Kitchen Difficulty breathing  . Swelling of face and throat  . A fast heartbeat  . A bad rash all over body  . Dizziness and weakness   Immunizations Administered    Name Date Dose VIS Date Route   Pfizer COVID-19 Vaccine 01/06/2020  9:54 AM 0.3 mL 11/11/2018 Intramuscular   Manufacturer: [place]   Lot: [id number]   [place]: [id number]

## 2020-01-12 DIAGNOSIS — M25562 Pain in left knee: Secondary | ICD-10-CM | POA: Diagnosis not present

## 2020-01-12 DIAGNOSIS — M549 Dorsalgia, unspecified: Secondary | ICD-10-CM | POA: Diagnosis not present

## 2020-01-14 ENCOUNTER — Ambulatory Visit: Payer: PPO | Admitting: Neurology

## 2020-01-14 ENCOUNTER — Other Ambulatory Visit: Payer: Self-pay

## 2020-01-14 ENCOUNTER — Encounter: Payer: Self-pay | Admitting: Neurology

## 2020-01-14 VITALS — BP 140/86 | HR 102 | Temp 97.6°F | Ht 66.5 in | Wt 233.3 lb

## 2020-01-14 DIAGNOSIS — R251 Tremor, unspecified: Secondary | ICD-10-CM | POA: Diagnosis not present

## 2020-01-14 NOTE — Patient Instructions (Signed)
You have a mild tremor of both hands.  I do not see any signs or symptoms of parkinson's like disease or what we call parkinsonism.   For your tremor, I would not recommend any new medication for fear of side effects (especially sleepiness and balance problems) or medication interactions, especially in light of you taking several medications, including potentially sedating medications. We do not have to make a follow up appointment.   Please remember, that any kind of tremor may be exacerbated by anxiety, anger, nervousness, excitement, dehydration, sleep deprivation, by caffeine, and low blood sugar values or blood sugar fluctuations, as well as thyroid dysfunction.  Please try to reduce your caffeine/coffee intake and eventually limit yourself to 2 cups per day. Increase your water intake to about 6-8 cups per day, 8 oz each.   I can see you back as needed.

## 2020-01-14 NOTE — Progress Notes (Signed)
Subjective:    Patient ID: Justin Trujillo is a 74 y.o. male.  HPI     Star Age, MD, PhD [facility] [address], [address] [place], [place] [address]  Dear Dr. Tamala Julian,  I saw your patient, Justin Trujillo, upon your kind request, in my Neurologic clinic today for initial consultation of his tremors.  The patient is unaccompanied today.  As you know, Justin Trujillo is a 74 year old left-handed gentleman with an underlying complex medical history of hypertension, hyperlipidemia, emphysema, sleep apnea, hypogonadism, arthritis, status post multiple surgeries, chronic pain, type 2 diabetes, smoking, chronic kidney disease, insomnia, and obesity, who reports a bilateral hand tremor, more so on the left for the past year or 2.  He feels it has been progressive, he denies any lower extremity tremor.  He has no family history of Parkinson's disease or tremor. The tremor is particularly noticeable when he holds something such as a drink with his left hand and it tends to spill. I reviewed your office note from 10/05/2019. Of note, he is on multiple medications including potentially sedating medications including oxycodone, alprazolam, ropinirole, tizanidine and gabapentin. He had blood work through your office on 10/05/2019 and I reviewed the results: A1c was 8.0, CBC with differential was unremarkable, lipid panel showed cholesterol of 146, triglycerides 208, LDL 64. Of note, he has had multiple surgeries including left reverse shoulder arthroplasty on 03/26/2019, knee replacement surgeries, right shoulder replacement in 2011, kyphoplasty in 2019, lumbar spine surgery in February 2020, tonsillectomy, adenoidectomy, vasectomy, and left cataract surgery. He smokes a little over 1 pack/day.  He is not ready to quit.  He drinks alcohol infrequently, in the form of beer every 2 or 3 months.  He drinks a lot of caffeine in the form of coffee, by self admission 2 pots per day which he  believes should be 8 to 10 cups/day and he does not drink a whole lot of water.  He uses a cane for safety and for longer distances.  His right knee still bothers him.  His Past Medical History Is Significant For: Past Medical History:  Diagnosis Date  . Arthritis   . Coronary atherosclerosis of native coronary artery   . Depression   . High cholesterol   . Hypertension   . Hypogonadism male   . Obstructive chronic bronchitis with exacerbation ([place])   . Obstructive sleep apnea (adult) (pediatric)    no cpap  . Other and unspecified hyperlipidemia   . Other emphysema ([place])   . Proteinuria 2019   has seen a nephrologist  . Shortness of breath    with excertion  . Type II or unspecified type diabetes mellitus without mention of complication, not stated as uncontrolled    type 2  . Unspecified essential hypertension     His Past Surgical History Is Significant For: Past Surgical History:  Procedure Laterality Date  . CARDIAC CATHETERIZATION    . IR KYPHO LUMBAR INC FX REDUCE BONE BX UNI/BIL CANNULATION INC/IMAGING  06/13/2018  . L lens implant    . R knee replacement x2    . REVERSE SHOULDER ARTHROPLASTY Left 03/26/2019   Procedure: REVERSE SHOULDER ARTHROPLASTY;  Surgeon: Justice Britain, MD;  Location: [facility];  Service: Orthopedics;  Laterality: Left;  174min  . TONSILLECTOMY    . TONSILLECTOMY AND ADENOIDECTOMY    . vascectomy      His Family History Is Significant For: Family History  Problem Relation Age of Onset  .  Emphysema Father   . Heart disease Father   . Clotting disorder Mother     His Social History Is Significant For: Social History   Socioeconomic History  . Marital status: Married    Spouse name: Not on file  . Number of children: Not on file  . Years of education: Not on file  . Highest education level: Not on file  Occupational History  . Occupation: Landscape architect  Tobacco Use  . Smoking status: Current Every Day Smoker     Packs/day: 1.00    Types: Cigarettes  . Smokeless tobacco: Never Used  Substance and Sexual Activity  . Alcohol use: Not Currently    Alcohol/week: 1.0 standard drinks    Types: 1 Cans of beer per week    Comment: occasional beer  . Drug use: Never  . Sexual activity: Not Currently  Other Topics Concern  . Not on file  Social History Narrative  . Not on file   Social Determinants of Health   Financial Resource Strain:   . Difficulty of Paying Living Expenses:   Food Insecurity:   . Worried About Charity fundraiser in the Last Year:   . Arboriculturist in the Last Year:   Transportation Needs:   . Film/video editor (Medical):   Marland Kitchen Lack of Transportation (Non-Medical):   Physical Activity:   . Days of Exercise per Week:   . Minutes of Exercise per Session:   Stress:   . Feeling of Stress :   Social Connections:   . Frequency of Communication with Friends and Family:   . Frequency of Social Gatherings with Friends and Family:   . Attends Religious Services:   . Active Member of Clubs or Organizations:   . Attends Archivist Meetings:   Marland Kitchen Marital Status:     His Allergies Are:  Allergies  Allergen Reactions  . Iodinated Diagnostic Agents Anaphylaxis  . Moxifloxacin Swelling    REACTION: GI upset, throat "tightened up"  . Penicillins Anaphylaxis    Did it involve swelling of the face/tongue/throat, SOB, or low BP? Yes Did it involve sudden or severe rash/hives, skin peeling, or any reaction on the inside of your mouth or nose? No Did you need to seek medical attention at a hospital or doctor's office? Yes When did it last happen?been a while  If all above answers are "NO", may proceed with cephalosporin use.   Marland Kitchen Shellfish-Derived Products Anaphylaxis  . Latex Rash    Over long periods of time  :   His Current Medications Are:  Outpatient Encounter Medications as of 01/14/2020  Medication Sig  . acetaminophen (TYLENOL) 500 MG tablet Take 1,000  mg by mouth every 8 (eight) hours as needed (pain).  Marland Kitchen ALPRAZolam (XANAX) 0.5 MG tablet Take 0.5 mg by mouth at bedtime.  Marland Kitchen aspirin EC 81 MG tablet Take 81 mg by mouth daily.  Marland Kitchen atorvastatin (LIPITOR) 40 MG tablet Take 1 tablet (40 mg total) by mouth daily.  . Calcium Carb-Cholecalciferol (CALCIUM 600+D3 PO) Take 1 tablet by mouth daily.  . celecoxib (CELEBREX) 200 MG capsule Take 200 mg by mouth daily.  . Cholecalciferol (VITAMIN D) 50 MCG (2000 UT) tablet Take 2,000 Units by mouth daily.  Marland Kitchen gabapentin (NEURONTIN) 300 MG capsule Take 2 capsules (600 mg total) by mouth 3 (three) times daily.  . insulin NPH-regular Human (NOVOLIN 70/30) (70-30) 100 UNIT/ML injection Inject 10-15 Units into the skin daily as needed (ONLY  WHEN FASTING BLOOD SUGAR 150 OR MORE).   Marland Kitchen linaclotide (LINZESS) 145 MCG CAPS capsule Take 145-290 mcg by mouth daily as needed (constipation).   Marland Kitchen lisinopril-hydrochlorothiazide (ZESTORETIC) 20-12.5 MG tablet Take 1 tablet by mouth daily.   . metFORMIN (GLUCOPHAGE) 1000 MG tablet Take 1 tablet (1,000 mg total) by mouth 2 (two) times daily with a meal.  . oxyCODONE-acetaminophen (PERCOCET) 10-325 MG tablet Take 1 tablet by mouth every 4 (four) hours as needed for pain.  Marland Kitchen rOPINIRole (REQUIP) 0.5 MG tablet Take 0.5 mg by mouth at bedtime.   Marland Kitchen testosterone cypionate (DEPOTESTOSTERONE CYPIONATE) 200 MG/ML injection Inject 200 mg into the muscle every 28 (twenty-eight) days.   Marland Kitchen tiZANidine (ZANAFLEX) 4 MG tablet Take 4 mg by mouth 3 (three) times daily as needed for muscle spasms.   Marland Kitchen trolamine salicylate (ASPERCREME) 10 % cream Apply 1 application topically 2 (two) times daily as needed for muscle pain.    No facility-administered encounter medications on file as of 01/14/2020.  :   Review of Systems:  Out of a complete 14 point review of systems, all are reviewed and negative with the exception of these symptoms as listed below:  Review of Systems  Neurological:       Here for  consultation on worsening bilateral hand tremors. Pt reports he has not tried meds for this issue in the past.     Objective:  Neurological Exam  Physical Exam Physical Examination:   Vitals:   01/14/20 1410  BP: 140/86  Pulse: (!) 102  Temp: 97.6 F (36.4 C)  SpO2: 94%    General Examination: The patient is a very pleasant 73 y.o. male in no acute distress. He appears well-developed and well-nourished and well groomed.   HEENT: Normocephalic, atraumatic, pupils are equal, round and reactive to light and accommodation.  Corrective eyeglasses in place, status post left cataract repair.  Face is symmetric with no facial masking noted, neck is supple with full range of motion, hearing is mildly impaired, no lip, neck or jaw tremor.  No dysarthria or hypophonia.  Airway examination reveals moderate mouth dryness, tongue protrudes centrally in palate elevates symmetrically.   Chest: Clear to auscultation without wheezing, rhonchi or crackles noted.  Heart: S1+S2+0, regular and normal without murmurs, rubs or gallops noted.   Abdomen: Soft, non-tender and non-distended with normal bowel sounds appreciated on auscultation.  Extremities: There is trace edema in the distal lower extremities bilaterally.  Skin: Warm and dry with chronic appearing bruising noted L>R forearms.   Musculoskeletal: exam reveals decreased range of motion and the right knee, somewhat decreased range of motion in left shoulder, some lower back discomfort reported.    Neurologically:  Mental status: The patient is awake, alert and oriented in all 4 spheres. His immediate and remote memory, attention, language skills and fund of knowledge are appropriate. There is no evidence of aphasia, agnosia, apraxia or anomia. Speech is clear with normal prosody and enunciation. Thought process is linear. Mood is normal and affect is normal.  Cranial nerves II - XII are as described above under HEENT exam. In addition: shoulder  shrug is normal with equal shoulder height noted. Motor exam: Normal bulk, strength and tone is noted. There is no drift, or rebound.   On 01/14/2020: On Archimedes spiral drawing he has mild insecurity with the right hand, slight trembling with the left hand, handwriting is slightly tremulous, legible, not micrographic. He has a mild postural tremor in both upper extremities, left side  a little worse than right, slight action tremor in both upper extremities, no intention tremor, no resting tremor.  No lower extremity tremor noted.  [place]berg is not tested for safety concerns. Reflexes are 1+ in the UEs and absent in the LEs. Fine motor skills and coordination: intact with normal finger taps, normal hand movements, normal rapid alternating patting, normal foot taps and normal foot agility.  Cerebellar testing: No dysmetria or intention tremor on finger to nose testing. Heel to shin is difficult on the R, due to decreased ROM, fairly good on the L. There is no truncal or gait ataxia.  Sensory exam: intact to light touch in the upper and lower extremities.  Gait, station and balance: He stands with mild difficulty and pushes himself up.  He has a mild increase in lumbar kyphosis, walks with a limp on the right side.  He has no shuffling, preserved arm swing is noted.   Assessment and Plan:   In summary, Justin Trujillo is a very pleasant 74 y.o.-year old male with an underlying complex medical history of hypertension, hyperlipidemia, emphysema, sleep apnea, hypogonadism, arthritis, status post multiple surgeries, chronic pain, type 2 diabetes, smoking, chronic kidney disease, insomnia, and obesity, who presents for evaluation of his hand tremors of approximately 1 to 2 years duration, more noticeable with the left hand.  On examination, he has a mild tremor in both hands, left more than right.  It is possible that he has manifestation of essential tremor, certainly no evidence of parkinsonism and he is very  reassured in that regard.  He is advised that I would not recommend any new medication for symptomatic control of his tremor, especially since the tremor is on the mild side but he is already on several medications including potentially sedating medications.  Symptomatic treatment with primidone or Mysoline may cause sedation and balance problem and I advised him that we should not take the risk.  He is advised of certain tremor triggers including caffeine and thyroid dysfunction and dehydration, stress and sleep deprivation.  He reports that he was diagnosed with mild sleep apnea many years ago but eventually stopped using his CPAP machine.  He is advised to stay better hydrated with water and reduce his caffeine intake.  He reports that he drinks 2 pots of coffee per day on average.  He is advised to gradually reduce this and try to limit himself to 2 to 3 cups/day at the most.  We talked about the importance of healthy lifestyle, weight management, and fall prevention.  He uses a cane which he brought today.  He is advised to follow-up with me as needed.  I answered all his questions today and he was in agreement with the plan.   Thank you very much for allowing me to participate in the care of this nice patient. If I can be of any further assistance to you please do not hesitate to call me at [phone].  Sincerely,   Star Age, MD, PhD

## 2020-01-26 DIAGNOSIS — M549 Dorsalgia, unspecified: Secondary | ICD-10-CM | POA: Diagnosis not present

## 2020-01-26 DIAGNOSIS — M25562 Pain in left knee: Secondary | ICD-10-CM | POA: Diagnosis not present

## 2020-02-04 DIAGNOSIS — E1121 Type 2 diabetes mellitus with diabetic nephropathy: Secondary | ICD-10-CM | POA: Diagnosis not present

## 2020-02-04 DIAGNOSIS — F324 Major depressive disorder, single episode, in partial remission: Secondary | ICD-10-CM | POA: Diagnosis not present

## 2020-02-04 DIAGNOSIS — G4700 Insomnia, unspecified: Secondary | ICD-10-CM | POA: Diagnosis not present

## 2020-02-04 DIAGNOSIS — I10 Essential (primary) hypertension: Secondary | ICD-10-CM | POA: Diagnosis not present

## 2020-02-04 DIAGNOSIS — E785 Hyperlipidemia, unspecified: Secondary | ICD-10-CM | POA: Diagnosis not present

## 2020-02-04 DIAGNOSIS — N181 Chronic kidney disease, stage 1: Secondary | ICD-10-CM | POA: Diagnosis not present

## 2020-02-04 DIAGNOSIS — E7800 Pure hypercholesterolemia, unspecified: Secondary | ICD-10-CM | POA: Diagnosis not present

## 2020-04-08 DIAGNOSIS — E291 Testicular hypofunction: Secondary | ICD-10-CM | POA: Diagnosis not present

## 2020-04-08 DIAGNOSIS — R948 Abnormal results of function studies of other organs and systems: Secondary | ICD-10-CM | POA: Diagnosis not present

## 2020-04-14 DIAGNOSIS — E291 Testicular hypofunction: Secondary | ICD-10-CM | POA: Diagnosis not present

## 2020-04-14 DIAGNOSIS — R350 Frequency of micturition: Secondary | ICD-10-CM | POA: Diagnosis not present

## 2020-04-14 DIAGNOSIS — N401 Enlarged prostate with lower urinary tract symptoms: Secondary | ICD-10-CM | POA: Diagnosis not present

## 2020-04-18 DIAGNOSIS — Z7984 Long term (current) use of oral hypoglycemic drugs: Secondary | ICD-10-CM | POA: Diagnosis not present

## 2020-04-18 DIAGNOSIS — I10 Essential (primary) hypertension: Secondary | ICD-10-CM | POA: Diagnosis not present

## 2020-04-18 DIAGNOSIS — F17200 Nicotine dependence, unspecified, uncomplicated: Secondary | ICD-10-CM | POA: Diagnosis not present

## 2020-04-18 DIAGNOSIS — N200 Calculus of kidney: Secondary | ICD-10-CM | POA: Diagnosis not present

## 2020-04-18 DIAGNOSIS — E1121 Type 2 diabetes mellitus with diabetic nephropathy: Secondary | ICD-10-CM | POA: Diagnosis not present

## 2020-04-18 DIAGNOSIS — G250 Essential tremor: Secondary | ICD-10-CM | POA: Diagnosis not present

## 2020-04-18 DIAGNOSIS — E291 Testicular hypofunction: Secondary | ICD-10-CM | POA: Diagnosis not present

## 2020-04-18 DIAGNOSIS — E785 Hyperlipidemia, unspecified: Secondary | ICD-10-CM | POA: Diagnosis not present

## 2020-04-25 DIAGNOSIS — G4700 Insomnia, unspecified: Secondary | ICD-10-CM | POA: Diagnosis not present

## 2020-04-25 DIAGNOSIS — E785 Hyperlipidemia, unspecified: Secondary | ICD-10-CM | POA: Diagnosis not present

## 2020-04-25 DIAGNOSIS — E1121 Type 2 diabetes mellitus with diabetic nephropathy: Secondary | ICD-10-CM | POA: Diagnosis not present

## 2020-04-25 DIAGNOSIS — F324 Major depressive disorder, single episode, in partial remission: Secondary | ICD-10-CM | POA: Diagnosis not present

## 2020-04-25 DIAGNOSIS — I10 Essential (primary) hypertension: Secondary | ICD-10-CM | POA: Diagnosis not present

## 2020-04-25 DIAGNOSIS — N181 Chronic kidney disease, stage 1: Secondary | ICD-10-CM | POA: Diagnosis not present

## 2020-04-25 DIAGNOSIS — E7800 Pure hypercholesterolemia, unspecified: Secondary | ICD-10-CM | POA: Diagnosis not present

## 2020-04-29 DIAGNOSIS — M4150 Other secondary scoliosis, site unspecified: Secondary | ICD-10-CM | POA: Diagnosis not present

## 2020-05-25 DIAGNOSIS — Z6836 Body mass index (BMI) 36.0-36.9, adult: Secondary | ICD-10-CM | POA: Diagnosis not present

## 2020-05-25 DIAGNOSIS — M5136 Other intervertebral disc degeneration, lumbar region: Secondary | ICD-10-CM | POA: Diagnosis not present

## 2020-05-25 DIAGNOSIS — M62838 Other muscle spasm: Secondary | ICD-10-CM | POA: Diagnosis not present

## 2020-05-25 DIAGNOSIS — M47816 Spondylosis without myelopathy or radiculopathy, lumbar region: Secondary | ICD-10-CM | POA: Diagnosis not present

## 2020-06-21 DIAGNOSIS — M47816 Spondylosis without myelopathy or radiculopathy, lumbar region: Secondary | ICD-10-CM | POA: Diagnosis not present

## 2020-06-21 DIAGNOSIS — M5136 Other intervertebral disc degeneration, lumbar region: Secondary | ICD-10-CM | POA: Diagnosis not present

## 2020-06-28 ENCOUNTER — Ambulatory Visit: Payer: PPO | Attending: Internal Medicine

## 2020-06-28 DIAGNOSIS — Z23 Encounter for immunization: Secondary | ICD-10-CM

## 2020-06-28 NOTE — Progress Notes (Signed)
   Covid-19 Vaccination Clinic  Name:  HEINZ ECKERT    MRN: [id number] DOB: 06/08/1946  06/28/2020  Mr. Guzzetta was observed post Covid-19 immunization for 15 minutes without incident. He was provided with Vaccine Information Sheet and instruction to access the V-Safe system.   Mr. Lehrmann was instructed to call 911 with any severe reactions post vaccine: Marland Kitchen Difficulty breathing  . Swelling of face and throat  . A fast heartbeat  . A bad rash all over body  . Dizziness and weakness

## 2020-07-13 DIAGNOSIS — G4700 Insomnia, unspecified: Secondary | ICD-10-CM | POA: Diagnosis not present

## 2020-07-13 DIAGNOSIS — I10 Essential (primary) hypertension: Secondary | ICD-10-CM | POA: Diagnosis not present

## 2020-07-13 DIAGNOSIS — E1121 Type 2 diabetes mellitus with diabetic nephropathy: Secondary | ICD-10-CM | POA: Diagnosis not present

## 2020-07-13 DIAGNOSIS — E7800 Pure hypercholesterolemia, unspecified: Secondary | ICD-10-CM | POA: Diagnosis not present

## 2020-07-13 DIAGNOSIS — E785 Hyperlipidemia, unspecified: Secondary | ICD-10-CM | POA: Diagnosis not present

## 2020-07-13 DIAGNOSIS — N181 Chronic kidney disease, stage 1: Secondary | ICD-10-CM | POA: Diagnosis not present

## 2020-07-13 DIAGNOSIS — F324 Major depressive disorder, single episode, in partial remission: Secondary | ICD-10-CM | POA: Diagnosis not present

## 2020-07-20 DIAGNOSIS — M47816 Spondylosis without myelopathy or radiculopathy, lumbar region: Secondary | ICD-10-CM | POA: Diagnosis not present

## 2020-07-20 DIAGNOSIS — M62838 Other muscle spasm: Secondary | ICD-10-CM | POA: Diagnosis not present

## 2020-07-20 DIAGNOSIS — M5136 Other intervertebral disc degeneration, lumbar region: Secondary | ICD-10-CM | POA: Diagnosis not present

## 2020-08-31 DIAGNOSIS — M47816 Spondylosis without myelopathy or radiculopathy, lumbar region: Secondary | ICD-10-CM | POA: Diagnosis not present

## 2020-08-31 DIAGNOSIS — M5136 Other intervertebral disc degeneration, lumbar region: Secondary | ICD-10-CM | POA: Diagnosis not present

## 2020-09-06 DIAGNOSIS — N181 Chronic kidney disease, stage 1: Secondary | ICD-10-CM | POA: Diagnosis not present

## 2020-09-06 DIAGNOSIS — I10 Essential (primary) hypertension: Secondary | ICD-10-CM | POA: Diagnosis not present

## 2020-09-06 DIAGNOSIS — G8929 Other chronic pain: Secondary | ICD-10-CM | POA: Diagnosis not present

## 2020-09-06 DIAGNOSIS — E7800 Pure hypercholesterolemia, unspecified: Secondary | ICD-10-CM | POA: Diagnosis not present

## 2020-09-06 DIAGNOSIS — G4700 Insomnia, unspecified: Secondary | ICD-10-CM | POA: Diagnosis not present

## 2020-09-06 DIAGNOSIS — E1121 Type 2 diabetes mellitus with diabetic nephropathy: Secondary | ICD-10-CM | POA: Diagnosis not present

## 2020-09-06 DIAGNOSIS — E785 Hyperlipidemia, unspecified: Secondary | ICD-10-CM | POA: Diagnosis not present

## 2020-09-06 DIAGNOSIS — F324 Major depressive disorder, single episode, in partial remission: Secondary | ICD-10-CM | POA: Diagnosis not present

## 2020-10-11 DIAGNOSIS — E785 Hyperlipidemia, unspecified: Secondary | ICD-10-CM | POA: Diagnosis not present

## 2020-10-11 DIAGNOSIS — I10 Essential (primary) hypertension: Secondary | ICD-10-CM | POA: Diagnosis not present

## 2020-10-11 DIAGNOSIS — E291 Testicular hypofunction: Secondary | ICD-10-CM | POA: Diagnosis not present

## 2020-10-11 DIAGNOSIS — Z1389 Encounter for screening for other disorder: Secondary | ICD-10-CM | POA: Diagnosis not present

## 2020-10-11 DIAGNOSIS — Z23 Encounter for immunization: Secondary | ICD-10-CM | POA: Diagnosis not present

## 2020-10-11 DIAGNOSIS — E1121 Type 2 diabetes mellitus with diabetic nephropathy: Secondary | ICD-10-CM | POA: Diagnosis not present

## 2020-10-11 DIAGNOSIS — G250 Essential tremor: Secondary | ICD-10-CM | POA: Diagnosis not present

## 2020-10-11 DIAGNOSIS — F324 Major depressive disorder, single episode, in partial remission: Secondary | ICD-10-CM | POA: Diagnosis not present

## 2020-10-11 DIAGNOSIS — Z7984 Long term (current) use of oral hypoglycemic drugs: Secondary | ICD-10-CM | POA: Diagnosis not present

## 2020-10-11 DIAGNOSIS — Z0000 Encounter for general adult medical examination without abnormal findings: Secondary | ICD-10-CM | POA: Diagnosis not present

## 2020-10-12 DIAGNOSIS — M62838 Other muscle spasm: Secondary | ICD-10-CM | POA: Diagnosis not present

## 2020-10-12 DIAGNOSIS — M5136 Other intervertebral disc degeneration, lumbar region: Secondary | ICD-10-CM | POA: Diagnosis not present

## 2020-10-12 DIAGNOSIS — M47816 Spondylosis without myelopathy or radiculopathy, lumbar region: Secondary | ICD-10-CM | POA: Diagnosis not present

## 2020-10-18 DIAGNOSIS — E7800 Pure hypercholesterolemia, unspecified: Secondary | ICD-10-CM | POA: Diagnosis not present

## 2020-10-18 DIAGNOSIS — G8929 Other chronic pain: Secondary | ICD-10-CM | POA: Diagnosis not present

## 2020-10-18 DIAGNOSIS — N181 Chronic kidney disease, stage 1: Secondary | ICD-10-CM | POA: Diagnosis not present

## 2020-10-18 DIAGNOSIS — I10 Essential (primary) hypertension: Secondary | ICD-10-CM | POA: Diagnosis not present

## 2020-10-18 DIAGNOSIS — E785 Hyperlipidemia, unspecified: Secondary | ICD-10-CM | POA: Diagnosis not present

## 2020-10-18 DIAGNOSIS — G4700 Insomnia, unspecified: Secondary | ICD-10-CM | POA: Diagnosis not present

## 2020-10-18 DIAGNOSIS — E1121 Type 2 diabetes mellitus with diabetic nephropathy: Secondary | ICD-10-CM | POA: Diagnosis not present

## 2020-10-18 DIAGNOSIS — F324 Major depressive disorder, single episode, in partial remission: Secondary | ICD-10-CM | POA: Diagnosis not present

## 2020-10-25 DIAGNOSIS — E291 Testicular hypofunction: Secondary | ICD-10-CM | POA: Diagnosis not present

## 2020-10-31 DIAGNOSIS — M5136 Other intervertebral disc degeneration, lumbar region: Secondary | ICD-10-CM | POA: Diagnosis not present

## 2020-10-31 DIAGNOSIS — M5416 Radiculopathy, lumbar region: Secondary | ICD-10-CM | POA: Diagnosis not present

## 2020-11-04 DIAGNOSIS — R3915 Urgency of urination: Secondary | ICD-10-CM | POA: Diagnosis not present

## 2020-11-04 DIAGNOSIS — R350 Frequency of micturition: Secondary | ICD-10-CM | POA: Diagnosis not present

## 2020-11-04 DIAGNOSIS — E291 Testicular hypofunction: Secondary | ICD-10-CM | POA: Diagnosis not present

## 2020-11-04 DIAGNOSIS — N401 Enlarged prostate with lower urinary tract symptoms: Secondary | ICD-10-CM | POA: Diagnosis not present

## 2020-12-01 DIAGNOSIS — R948 Abnormal results of function studies of other organs and systems: Secondary | ICD-10-CM | POA: Diagnosis not present

## 2020-12-01 DIAGNOSIS — R3915 Urgency of urination: Secondary | ICD-10-CM | POA: Diagnosis not present

## 2020-12-01 DIAGNOSIS — N401 Enlarged prostate with lower urinary tract symptoms: Secondary | ICD-10-CM | POA: Diagnosis not present

## 2020-12-01 DIAGNOSIS — R350 Frequency of micturition: Secondary | ICD-10-CM | POA: Diagnosis not present

## 2020-12-01 DIAGNOSIS — E291 Testicular hypofunction: Secondary | ICD-10-CM | POA: Diagnosis not present

## 2020-12-02 DIAGNOSIS — M62838 Other muscle spasm: Secondary | ICD-10-CM | POA: Diagnosis not present

## 2020-12-02 DIAGNOSIS — M4150 Other secondary scoliosis, site unspecified: Secondary | ICD-10-CM | POA: Diagnosis not present

## 2020-12-02 DIAGNOSIS — M47816 Spondylosis without myelopathy or radiculopathy, lumbar region: Secondary | ICD-10-CM | POA: Diagnosis not present

## 2020-12-13 DIAGNOSIS — I10 Essential (primary) hypertension: Secondary | ICD-10-CM | POA: Diagnosis not present

## 2020-12-13 DIAGNOSIS — F324 Major depressive disorder, single episode, in partial remission: Secondary | ICD-10-CM | POA: Diagnosis not present

## 2020-12-13 DIAGNOSIS — G4700 Insomnia, unspecified: Secondary | ICD-10-CM | POA: Diagnosis not present

## 2020-12-13 DIAGNOSIS — E7800 Pure hypercholesterolemia, unspecified: Secondary | ICD-10-CM | POA: Diagnosis not present

## 2020-12-13 DIAGNOSIS — G8929 Other chronic pain: Secondary | ICD-10-CM | POA: Diagnosis not present

## 2020-12-13 DIAGNOSIS — E785 Hyperlipidemia, unspecified: Secondary | ICD-10-CM | POA: Diagnosis not present

## 2020-12-13 DIAGNOSIS — N181 Chronic kidney disease, stage 1: Secondary | ICD-10-CM | POA: Diagnosis not present

## 2020-12-13 DIAGNOSIS — E1121 Type 2 diabetes mellitus with diabetic nephropathy: Secondary | ICD-10-CM | POA: Diagnosis not present

## 2020-12-15 ENCOUNTER — Encounter: Payer: Self-pay | Admitting: Neurology

## 2020-12-15 ENCOUNTER — Ambulatory Visit: Payer: PPO | Admitting: Neurology

## 2020-12-15 VITALS — BP 104/62 | HR 98 | Ht 66.0 in | Wt 222.0 lb

## 2020-12-15 DIAGNOSIS — R251 Tremor, unspecified: Secondary | ICD-10-CM

## 2020-12-15 NOTE — Progress Notes (Signed)
Subjective:    Patient ID: Justin Trujillo is a 75 y.o. male.  HPI     Interim history:   Justin Trujillo is a 75 year old left-handed gentleman with an underlying complex medical history of hypertension, hyperlipidemia, emphysema, sleep apnea, hypogonadism, arthritis, status post multiple surgeries, chronic pain, type 2 diabetes, smoking, chronic kidney disease, insomnia, and obesity, who presents for reevaluation of his hand tremors.  The patient is unaccompanied today.  I first met him at the request of his primary care physician on 01/14/2020, at which time he reported a 1 to 2-year history of hand tremors.  He was noted to have a mild tremor in both hands, left side more noticeable than right.  He did not have any obvious parkinsonism.  He was advised that symptomatic medications for tremor control were limited and given that he was already on several medications including potentially sedating medications, I advised him against additional medication for his tremor.  We talked about tremor triggers.  He was advised to reduce his coffee intake and stay well-hydrated with water.  At the time, he was on alprazolam, gabapentin, oxycodone, Requip, tizanidine.  Today, 12/15/2020: He reports that his tremor has become worse.  It is more noticeable on the left side.  Sometimes it is in both hands.  He has difficulty as the day progresses, by the evening his anxiety flares up and his tremor gets much worse.  He has not had any recent falls.  He tries to hydrate well with water but continues to drink quite a bit of caffeine in the form of coffee, 3 pots of coffee per day on average, each part is about 50 ounces.  He continues to be on several sedating and high risk medications, he is on high-dose Celebrex, Xanax every day, gabapentin daily, Zanaflex, and Percocet as needed.  He is also on ropinirole.  The patient's allergies, current medications, family history, past medical history, past social history, past  surgical history and problem list were reviewed and updated as appropriate.   Previously:   01/14/20: (He) reports a bilateral hand tremor, more so on the left for the past year or 2.  He feels it has been progressive, he denies any lower extremity tremor.  He has no family history of Parkinson's disease or tremor. The tremor is particularly noticeable when he holds something such as a drink with his left hand and it tends to spill. I reviewed your office note from 10/05/2019. Of note, he is on multiple medications including potentially sedating medications including oxycodone, alprazolam, ropinirole, tizanidine and gabapentin. He had blood work through your office on 10/05/2019 and I reviewed the results: A1c was 8.0, CBC with differential was unremarkable, lipid panel showed cholesterol of 146, triglycerides 208, LDL 64. Of note, he has had multiple surgeries including left reverse shoulder arthroplasty on 03/26/2019, knee replacement surgeries, right shoulder replacement in 2011, kyphoplasty in 2019, lumbar spine surgery in February 2020, tonsillectomy, adenoidectomy, vasectomy, and left cataract surgery. He smokes a little over 1 pack/day.  He is not ready to quit.  He drinks alcohol infrequently, in the form of beer every 2 or 3 months.  He drinks a lot of caffeine in the form of coffee, by self admission 2 pots per day which he believes should be 8 to 10 cups/day and he does not drink a whole lot of water.  He uses a cane for safety and for longer distances.  His right knee still bothers him.  His Past Medical  History Is Significant For: Past Medical History:  Diagnosis Date  . Arthritis   . Coronary atherosclerosis of native coronary artery   . Depression   . High cholesterol   . Hypertension   . Hypogonadism male   . Obstructive chronic bronchitis with exacerbation (HCC)   . Obstructive sleep apnea (adult) (pediatric)    no cpap  . Other and unspecified hyperlipidemia   . Other emphysema  (HCC)   . Proteinuria 2019   has seen a nephrologist  . Shortness of breath    with excertion  . Type II or unspecified type diabetes mellitus without mention of complication, not stated as uncontrolled    type 2  . Unspecified essential hypertension     His Past Surgical History Is Significant For: Past Surgical History:  Procedure Laterality Date  . CARDIAC CATHETERIZATION    . IR KYPHO LUMBAR INC FX REDUCE BONE BX UNI/BIL CANNULATION INC/IMAGING  06/13/2018  . L lens implant    . R knee replacement x2    . REVERSE SHOULDER ARTHROPLASTY Left 03/26/2019   Procedure: REVERSE SHOULDER ARTHROPLASTY;  Surgeon: Francena Hanly, MD;  Location: [facility];  Service: Orthopedics;  Laterality: Left;   . TONSILLECTOMY    . TONSILLECTOMY AND ADENOIDECTOMY    . vascectomy      His Family History Is Significant For: Family History  Problem Relation Age of Onset  . Emphysema Father   . Heart disease Father   . Clotting disorder Mother     His Social History Is Significant For: Social History   Socioeconomic History  . Marital status: Married    Spouse name: Not on file  . Number of children: Not on file  . Years of education: Not on file  . Highest education level: Not on file  Occupational History  . Occupation: Architect  Tobacco Use  . Smoking status: Current Every Day Smoker    Packs/day: 1.00    Types: Cigarettes  . Smokeless tobacco: Never Used  Vaping Use  . Vaping Use: Never used  Substance and Sexual Activity  . Alcohol use: Not Currently    Alcohol/week: 1.0 standard drink    Types: 1 Cans of beer per week    Comment: occasional beer  . Drug use: Never  . Sexual activity: Not Currently  Other Topics Concern  . Not on file  Social History Narrative  . Not on file   Social Determinants of Health   Financial Resource Strain: Not on file  Food Insecurity: Not on file  Transportation Needs: Not on file  Physical Activity: Not on file   Stress: Not on file  Social Connections: Not on file    His Allergies Are:  Allergies  Allergen Reactions  . Iodinated Diagnostic Agents Anaphylaxis  . Moxifloxacin Swelling    REACTION: GI upset, throat "tightened up"  . Penicillins Anaphylaxis    Did it involve swelling of the face/tongue/throat, SOB, or low BP? Yes Did it involve sudden or severe rash/hives, skin peeling, or any reaction on the inside of your mouth or nose? No Did you need to seek medical attention at a hospital or doctor's office? Yes When did it last happen?been a while  If all above answers are "NO", may proceed with cephalosporin use.   Marland Kitchen Shellfish-Derived Products Anaphylaxis  . Latex Rash    Over long periods of time  :   His Current Medications Are:  Outpatient Encounter Medications as of 12/15/2020  Medication  Sig  . acetaminophen (TYLENOL) 500 MG tablet Take 650 mg by mouth every 8 (eight) hours as needed (pain).  Marland Kitchen ALPRAZolam (XANAX) 0.5 MG tablet Take 0.5 mg by mouth at bedtime.  Marland Kitchen aspirin EC 81 MG tablet Take 81 mg by mouth daily.  Marland Kitchen atorvastatin (LIPITOR) 40 MG tablet Take 1 tablet (40 mg total) by mouth daily.  . Calcium Carb-Cholecalciferol (CALCIUM 600+D3 PO) Take 1 tablet by mouth daily.  . celecoxib (CELEBREX) 200 MG capsule Take 200 mg by mouth daily. 11/2 per day 300 mg per day  . Cholecalciferol (VITAMIN D) 50 MCG (2000 UT) tablet Take 2,000 Units by mouth daily.  Marland Kitchen gabapentin (NEURONTIN) 300 MG capsule Take 2 capsules (600 mg total) by mouth 3 (three) times daily. (Patient taking differently: Take 300 mg by mouth 3 (three) times daily.)  . insulin NPH-regular Human (NOVOLIN 70/30) (70-30) 100 UNIT/ML injection Inject 10-15 Units into the skin daily as needed (ONLY WHEN FASTING BLOOD SUGAR 150 OR MORE).   Marland Kitchen linaclotide (LINZESS) 145 MCG CAPS capsule Take 145-290 mcg by mouth daily as needed (constipation).   Marland Kitchen lisinopril-hydrochlorothiazide (ZESTORETIC) 20-12.5 MG tablet Take 1 tablet  by mouth daily. 12.5 mg only  . metFORMIN (GLUCOPHAGE) 1000 MG tablet Take 1 tablet (1,000 mg total) by mouth 2 (two) times daily with a meal.  . oxyCODONE-acetaminophen (PERCOCET) 10-325 MG tablet Take 1 tablet by mouth every 4 (four) hours as needed for pain.  Marland Kitchen rOPINIRole (REQUIP) 0.5 MG tablet Take 0.5 mg by mouth at bedtime.  . Semaglutide (OZEMPIC, 1 MG/DOSE, [place]) Inject into the skin.  Marland Kitchen testosterone cypionate (DEPOTESTOSTERONE CYPIONATE) 200 MG/ML injection Inject 200 mg into the muscle every 28 (twenty-eight) days. Every 2 weeks  . tiZANidine (ZANAFLEX) 4 MG tablet Take 4 mg by mouth 3 (three) times daily as needed for muscle spasms.   Marland Kitchen trolamine salicylate (ASPERCREME) 10 % cream Apply 1 application topically 2 (two) times daily as needed for muscle pain.    No facility-administered encounter medications on file as of 12/15/2020.  :  Review of Systems:  Out of a complete 14 point review of systems, all are reviewed and negative with the exception of these symptoms as listed below: Review of Systems  Neurological:       Here for f/u on bilateral hand tremors. Pt reports tremors have increased since last visit.  Pt sts left hand is worse than right and at night time sx are worse and typically takes a xanax to help with sx.     Objective:  Neurological Exam  Physical Exam Physical Examination:   Vitals:   12/15/20 1347  BP: 104/62  Pulse: 98  SpO2: 96%    General Examination: The patient is a very pleasant 75 y.o. male in no acute distress. He appears well-developed and well-nourished and well groomed.   HEENT: Normocephalic, atraumatic, pupils are equal, round and reactive to light and accommodation.  Corrective eyeglasses in place, face is symmetric with no facial masking noted, neck is supple with full range of motion, hearing is mildly impaired, no lip, neck or jaw tremor.  No dysarthria or hypophonia.  Airway examination reveals moderate mouth dryness, tongue protrudes  centrally in palate elevates symmetrically.   Chest: Clear to auscultation without wheezing, rhonchi or crackles noted.  Heart: S1+S2+0, regular and normal without murmurs, rubs or gallops noted.   Abdomen: Soft, non-tender and non-distended.  Extremities: There is trace edema in the distal lower extremities bilaterally.  Skin: Warm and  dry with chronic appearing bruising.   Musculoskeletal: exam reveals decreased range of motion and the right knee, more lumbar stoop.   Neurologically:  Mental status: The patient is awake, alert and oriented in all 4 spheres. His immediate and remote memory, attention, language skills and fund of knowledge are appropriate. There is no evidence of aphasia, agnosia, apraxia or anomia. Speech is clear with normal prosody and enunciation. Thought process is linear. Mood is normal and affect is normal.  Cranial nerves II - XII are as described above under HEENT exam. Motor exam: Normal bulk, strength and tone is noted. There is no drift, or rebound.   (On 01/14/2020: On Archimedes spiral drawing he has mild insecurity with the right hand, slight trembling with the left hand, handwriting is slightly tremulous, legible, not micrographic.)  He has a mild postural tremor in both upper extremities, left side a little worse than right, slight action tremor in both upper extremities, no intention tremor, no resting tremor.  No lower extremity tremor noted, all stable.  Romberg is not tested for safety concerns. Reflexes are 1+ in the UEs and absent in the LEs. Fine motor skills and coordination: intact with normal finger taps, normal hand movements, normal rapid alternating patting, normal foot taps and normal foot agility.  Cerebellar testing: No dysmetria or intention tremor on finger to nose testing.  Sensory exam: intact to light touch in the upper and lower extremities.  Gait, station and balance: He stands with mild to moderate difficulty and pushes  himself up.  He has a mild to moderate increase in lumbar kyphosis, walks with a limp on the right side.  He has no shuffling, preserved arm swing is noted.   Assessment and Plan:   In summary, Justin Trujillo is a very pleasant 75 year old male with an underlying complex medical history of hypertension, hyperlipidemia, emphysema, sleep apnea, hypogonadism, arthritis, status post multiple surgeries, chronic pain, on chronic pain medication, including Celebrex and narcotic pain medication, also on gabapentin and Zanaflex, type 2 diabetes, smoking, chronic kidney disease, insomnia, and obesity, who presents for re-evaluation of his hand tremors of about 2-3 years' duration, more noticeable with the left hand.    Exam is stable compared to last year.  He continues to drink quite a bit of caffeine in the form of coffee.  He is advised to reduce his caffeine intake and stay better hydrated with water.  I do not feel comfortable adding yet another medication, a beta-blocker would be at risk of masking low blood sugar values in a diabetic and also he has a lower blood pressure, he would be at risk for low blood pressure.  He is advised that I would not recommend any new medication for symptomatic control of his tremor, for fear of side effects and interactions with his current medications. Symptomatic treatment with primidone or Mysoline may cause sedation and balance problems.  He is advised to talk to his primary care physician about a referral to another neurological practice, preferably academic practice, we talked about nonpharmacological treatment options including surgical options, he may or may not be a candidate for deep brain stimulation.  Focused ultrasound treatment has been a more notable treatment approach.  He is advised to talk to his primary care physician about a referral.  He is advised to follow-up with Dr. Tamala Julian for this.  I answered all his questions today and he was in agreement.  I spent 30  minutes in total face-to-face time and in  reviewing records during pre-charting, more than 50% of which was spent in counseling and coordination of care, reviewing test results, reviewing medications and treatment regimen and/or in discussing or reviewing the diagnosis of tremor, the prognosis and treatment options. Pertinent laboratory and imaging test results that were available during this visit with the patient were reviewed by me and considered in my medical decision making (see chart for details).

## 2020-12-15 NOTE — Patient Instructions (Addendum)
It was good to see you again today.  Unfortunately, I have no additional suggestions for your tremor and do not feel comfortable adding any medications to your list as you are already on several high risk medications.  Please talk to your primary care physician about a referral to another neurologist for another opinion and treatment options for tremor control.    Please try to hydrate well with water and try to reduce your caffeine intake, drinking 3 pots of coffee can certainly be a contributor to tremor and also feeding to your anxiety which in turn can exacerbate your tremors.  At this juncture, I recommend that you follow-up with your primary care physician.

## 2020-12-19 DIAGNOSIS — F439 Reaction to severe stress, unspecified: Secondary | ICD-10-CM | POA: Diagnosis not present

## 2020-12-19 DIAGNOSIS — G250 Essential tremor: Secondary | ICD-10-CM | POA: Diagnosis not present

## 2020-12-19 DIAGNOSIS — I10 Essential (primary) hypertension: Secondary | ICD-10-CM | POA: Diagnosis not present

## 2020-12-20 ENCOUNTER — Other Ambulatory Visit: Payer: Self-pay

## 2020-12-20 ENCOUNTER — Ambulatory Visit: Payer: PPO | Admitting: Neurology

## 2020-12-20 ENCOUNTER — Encounter: Payer: Self-pay | Admitting: Neurology

## 2020-12-20 VITALS — BP 98/64 | HR 104 | Ht 66.0 in | Wt 221.0 lb

## 2020-12-20 DIAGNOSIS — G894 Chronic pain syndrome: Secondary | ICD-10-CM | POA: Diagnosis not present

## 2020-12-20 DIAGNOSIS — G250 Essential tremor: Secondary | ICD-10-CM | POA: Diagnosis not present

## 2020-12-20 DIAGNOSIS — Z716 Tobacco abuse counseling: Secondary | ICD-10-CM | POA: Diagnosis not present

## 2020-12-20 DIAGNOSIS — F411 Generalized anxiety disorder: Secondary | ICD-10-CM | POA: Diagnosis not present

## 2020-12-20 DIAGNOSIS — F4310 Post-traumatic stress disorder, unspecified: Secondary | ICD-10-CM

## 2020-12-20 MED ORDER — PRIMIDONE 50 MG PO TABS: 50.0000 mg | ORAL_TABLET | Freq: Every day | ORAL | 1 refills | Status: DC

## 2020-12-20 NOTE — Progress Notes (Signed)
Assessment/Plan:   1.    Essential Tremor.  -This is evidenced by the symmetrical nature and longstanding hx of gradually getting worse.  We discussed nature and pathophysiology.  We discussed that this can continue to gradually get worse with time.  We discussed that some medications can worsen this, as can caffeine use.  We discussed medication therapy as well as surgical therapy.  Ultimately, the patient decided to start primidone, 50 mg nightly.  R/B/SE were discussed.  The opportunity to ask questions was given and they were answered to the best of my ability.  The patient expressed understanding and willingness to follow the outlined treatment protocols.  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  We discussed this in detail.    2.  Chronic pain  -Following with pain management and on chronic opioid medications  3.  Anxiety  -On Xanax twice daily.  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  4.  Tobacco abuse  -Currently smoking 1 packs/day    - Patient was informed of the dangers of tobacco abuse including stroke, cancer, and MI, as well as benefits of tobacco cessation.  - Patient not willing to quit at this time.  - Approximately 3 mins were spent counseling patient cessation techniques. We discussed various methods to help quit smoking, including deciding on a date to quit, joining a support group, pharmacological agents- nicotine gum/patch/lozenges, chantix,   - I will reassess his progress at future visit.   Subjective:   Justin Trujillo was seen today in the movement disorders clinic for neurologic consultation at the request of Carol Ada, MD.   The consultation is for the evaluation of second opinion for tremor.  Patient has seen [facility] neurology for the same, last seen Dr. Rexene Alberts within the last week.  Those medical records have been reviewed.  Diagnosis was "tremor of both hands."  Medications were not recommended due to the fact that the patient was already on multiple medications.  It was recommended that he seek care at an academic center and decrease his use of caffeine.  Tremor: Yes.     How long has it been going on? 1.5 years  At rest or with activation?  Activation mostly  When is it noted the most?  Worse if arm is extended - if elbow flexed it is better.  Hard to carry coffee.  Worse in afternoon and will take xanax for it because he gets panic/anxiety attack because of it  Fam hx of tremor?  No.  Located where?  Started in L hand (dominant) but now in the R  Affected by caffeine: doesn't think so(drinks 1/2 pot of coffee in the AM and 1 pot in the afternoon and may have another later)  Affected by alcohol: has not had enough to know  Affected by stress:  Yes.    Affected by fatigue:  Yes.    Spills soup if on spoon:  May later in the afternoon  Spills glass of liquid if full:  Yes.    Tremor inducing meds:  No.  Tremor improving meds:  On no meds specifically for tremor.  Takes xanax bid (verified via [facility]).  Dr. Rexene Alberts expressed concern about this with his age and percocet (#90/month)  Other Specific Symptoms:  Voice: unknown Sleep:   Vivid Dreams:  Yes.    Acting out dreams:  Has in the past Postural symptoms:  Yes.  , has back issues and knee issues (sees Dr Maureen Ralphs)  Falls?  No. Loss of smell:  No. Loss of taste:  No. Urinary Incontinence:  No. but has urgency and frequency Difficulty Swallowing:  Yes.  , just recently over the last few weeks - only with solids and noting if drinks water it will help him Trouble with ADL's:  No.  Trouble buttoning clothing: No. N/V:  No. Lightheaded:  No. but  admits to vertigo if looks up "especially if on a ladder and looking up"  Syncope: No. Diplopia:  No. Dyskinesia:  No.  Neuroimaging of the brain has not previously been performed in the previous years.     ALLERGIES:   Allergies  Allergen Reactions  . Iodinated Diagnostic Agents Anaphylaxis  . Moxifloxacin Swelling    REACTION: GI upset, throat "tightened up"  . Penicillins Anaphylaxis    Did it involve swelling of the face/tongue/throat, SOB, or low BP? Yes Did it involve sudden or severe rash/hives, skin peeling, or any reaction on the inside of your mouth or nose? No Did you need to seek medical attention at a hospital or doctor's office? Yes When did it last happen?been a while  If all above answers are "NO", may proceed with cephalosporin use.   Marland Kitchen Shellfish-Derived Products Anaphylaxis  . Latex Rash    Over long periods of time    CURRENT MEDICATIONS:  Current Outpatient Medications  Medication Instructions  . acetaminophen (TYLENOL) 650 mg, Oral, Every 8 hours PRN  . ALPRAZolam (XANAX) 0.5 mg, Oral, 2 times daily  . aspirin EC 81 mg, Oral, Daily  . atorvastatin (LIPITOR) 40 mg, Oral, Daily  . Calcium Carb-Cholecalciferol (CALCIUM 600+D3 PO) 1 tablet, Oral, Daily  . escitalopram (LEXAPRO) 10 mg, Oral, Daily  . gabapentin (NEURONTIN) 600 mg, Oral, 3 times daily  . lisinopril-hydrochlorothiazide (ZESTORETIC) 20-12.5 MG tablet 1 tablet, Oral, Daily, 12.5 mg only  . metFORMIN (GLUCOPHAGE) 1,000 mg, Oral, 2 times daily with meals  . oxyCODONE-acetaminophen (PERCOCET) 10-325 MG tablet 1 tablet, Oral, Every 4 hours PRN  . rOPINIRole (REQUIP) 0.5 mg, Oral, Daily at bedtime  . Semaglutide (OZEMPIC, 1 MG/DOSE, [place]) 1 each, Subcutaneous, Weekly  . testosterone cypionate (DEPOTESTOSTERONE CYPIONATE) 200 mg, Intramuscular, Every 28 days, Every 2 weeks  . trolamine salicylate (ASPERCREME) 10 % cream 1 application, Topical, 2 times daily PRN  . Vitamin D 2,000 Units, Oral,  Daily    Objective:   PHYSICAL EXAMINATION:    VITALS:   Vitals:   12/20/20 0900  BP: 98/64  Pulse: (!) 104  SpO2: 98%  Weight: 221 lb (100.2 kg)  Height: 5\' 6"  (1.676 m)    GEN:  The patient appears stated age and is in NAD. HEENT:  Normocephalic, atraumatic.  The mucous membranes are moist. The superficial temporal arteries are without ropiness or tenderness. CV:  Tachy.  regular Lungs:  CTAB Neck/HEME:  There are no carotid bruits bilaterally.  Neurological examination:  Orientation: The patient is alert and oriented x3.  Cranial nerves: There is good facial symmetry.  Extraocular muscles are intact. The visual fields are full to confrontational testing. The speech is fluent  and clear. Soft palate rises symmetrically and there is no tongue deviation. Hearing is decreased to conversational tone. Sensation: Sensation is intact to light touch throughout (facial, trunk, extremities). Vibration is intact at the bilateral big toe. There is no extinction with double simultaneous stimulation.  Motor: Strength is 5/5 in the bilateral upper and lower extremities.   Shoulder shrug is equal and symmetric.  There is no pronator drift. Deep tendon reflexes: Deep tendon reflexes are 1/4 at the bilateral biceps, triceps, brachioradialis, absent at the bilateral patella and achilles. Plantar responses are downgoing bilaterally.  Movement examination: Tone: There is nl tone in the bilateral upper extremities.  The tone in the lower extremities is nl.  Abnormal movements: no rest tremor.  Mild postural tremor.  Mild intention tremor bilaterally.  Mild trouble with archimedes spirals bilaterally Coordination:  There is no decremation with RAM's, with any form of RAMS, including alternating supination and pronation of the forearm, hand opening and closing, finger taps, heel taps and toe taps. Gait and Station: The patient has mild difficulty arising out of a deep-seated chair without the use of the  hands. The patient's stride length is good but wide based and ataxic (has back and knee issues).   I have reviewed and interpreted the following labs independently   Chemistry      Component Value Date/Time   NA 138 03/23/2019 1540   K 4.7 03/23/2019 1540   CL 104 03/23/2019 1540   CO2 26 03/23/2019 1540   BUN 23 03/23/2019 1540   CREATININE 0.91 03/23/2019 1540   CREATININE 0.76 01/14/2012 0750      Component Value Date/Time   CALCIUM 9.5 03/23/2019 1540   ALKPHOS 65 01/14/2012 0750   AST 18 01/14/2012 0750   ALT 16 01/14/2012 0750   BILITOT 0.4 01/14/2012 0750      Lab Results  Component Value Date   TSH 0.800 01/14/2012   Lab Results  Component Value Date   WBC 10.7 (H) 03/23/2019   HGB 14.8 03/23/2019   HCT 45.0 03/23/2019   MCV 92.2 03/23/2019   PLT 194 03/23/2019      Total time spent on today's visit was 67 minutes, including both face-to-face time and nonface-to-face time.  Time included that spent on review of records (prior notes available to me/labs/imaging if pertinent), discussing treatment and goals, answering patient's questions and coordinating care.  Cc:  Carol Ada, MD

## 2020-12-20 NOTE — Patient Instructions (Signed)
Start primidone 50 mg - 1/2 tablet at bedtime for 1 week and then increase to 1 tablet at bedtime thereafter.   Essential Tremor A tremor is trembling or shaking that a person cannot control. Most tremors affect the hands or arms. Tremors can also affect the head, vocal cords, legs, and other parts of the body. Essential tremor is a tremor without a known cause. Usually, it occurs while a person is trying to perform an action. It tends to get worse gradually as a person ages. What are the causes? The cause of this condition is not known. What increases the risk? You are more likely to develop this condition if:  You have a family member with essential tremor.  You are age 70 or older.  You take certain medicines. What are the signs or symptoms? The main sign of a tremor is a rhythmic shaking of certain parts of your body that is uncontrolled and unintentional. You may:  Have difficulty eating with a spoon or fork.  Have difficulty writing.  Nod your head up and down or side to side.  Have a quivering voice. The shaking may:  Get worse over time.  Come and go.  Be more noticeable on one side of your body.  Get worse due to stress, fatigue, caffeine, and extreme heat or cold. How is this diagnosed? This condition may be diagnosed based on:  Your symptoms and medical history.  A physical exam. There is no single test to diagnose an essential tremor. However, your health care provider may order tests to rule out other causes of your condition. These may include:  Blood and urine tests.  Imaging studies of your brain, such as CT scan and MRI.  A test that measures involuntary muscle movement (electromyogram).   How is this treated? Treatment for essential tremor depends on the severity of the condition.  Some tremors may go away without treatment.  Mild tremors may not need treatment if they do not affect your day-to-day life.  Severe tremors may need to be treated  using one or more of the following options: ? Medicines. ? Lifestyle changes. ? Occupational or physical therapy. Follow these instructions at home: Lifestyle  Do not use any products that contain nicotine or tobacco, such as cigarettes and e-cigarettes. If you need help quitting, ask your health care provider.  Limit your caffeine intake as told by your health care provider.  Try to get 8 hours of sleep each night.  Find ways to manage your stress that fits your lifestyle and personality. Consider trying meditation or yoga.  Try to anticipate stressful situations and allow extra time to manage them.  If you are struggling emotionally with the effects of your tremor, consider working with a mental health provider.   General instructions  Take over-the-counter and prescription medicines only as told by your health care provider.  Avoid extreme heat and extreme cold.  Keep all follow-up visits as told by your health care provider. This is important. Visits may include physical therapy visits. Contact a health care provider if:  You experience any changes in the location or intensity of your tremors.  You start having a tremor after starting a new medicine.  You have tremor with other symptoms, such as: ? Numbness. ? Tingling. ? Pain. ? Weakness.  Your tremor gets worse.  Your tremor interferes with your daily life.  You feel down, blue, or sad for at least 2 weeks in a row.  Worrying about your  tremor and what other people think about you interferes with your everyday life functions, including relationships, work, or school. Summary  Essential tremor is a tremor without a known cause. Usually, it occurs when you are trying to perform an action.  You are more likely to develop this condition if you have a family member with essential tremor.  The main sign of a tremor is a rhythmic shaking of certain parts of your body that is uncontrolled and  unintentional.  Treatment for essential tremor depends on the severity of the condition. This information is not intended to replace advice given to you by your health care provider. Make sure you discuss any questions you have with your health care provider. Document Revised: 05/27/2020 Document Reviewed: 05/27/2020 Elsevier Patient Education  2021 [employer].

## 2020-12-27 DIAGNOSIS — M47816 Spondylosis without myelopathy or radiculopathy, lumbar region: Secondary | ICD-10-CM | POA: Diagnosis not present

## 2021-01-10 ENCOUNTER — Encounter: Payer: Self-pay | Admitting: Student

## 2021-01-10 ENCOUNTER — Other Ambulatory Visit: Payer: Self-pay

## 2021-01-10 ENCOUNTER — Ambulatory Visit (INDEPENDENT_AMBULATORY_CARE_PROVIDER_SITE_OTHER): Payer: PPO | Admitting: Student

## 2021-01-10 VITALS — BP 98/68 | HR 97 | Ht 67.0 in | Wt 211.0 lb

## 2021-01-10 DIAGNOSIS — I2510 Atherosclerotic heart disease of native coronary artery without angina pectoris: Secondary | ICD-10-CM | POA: Diagnosis not present

## 2021-01-10 DIAGNOSIS — R0789 Other chest pain: Secondary | ICD-10-CM | POA: Diagnosis not present

## 2021-01-10 DIAGNOSIS — I10 Essential (primary) hypertension: Secondary | ICD-10-CM | POA: Diagnosis not present

## 2021-01-10 DIAGNOSIS — Z720 Tobacco use: Secondary | ICD-10-CM

## 2021-01-10 LAB — BASIC METABOLIC PANEL
BUN/Creatinine Ratio: 14 (ref 10–24)
BUN: 16 mg/dL (ref 8–27)
CO2: 24 mmol/L (ref 20–29)
Calcium: 10.2 mg/dL (ref 8.6–10.2)
Chloride: 93 mmol/L — ABNORMAL LOW (ref 96–106)
Creatinine, Ser: 1.15 mg/dL (ref 0.76–1.27)
Glucose: 99 mg/dL (ref 65–99)
Potassium: 4.8 mmol/L (ref 3.5–5.2)
Sodium: 133 mmol/L — ABNORMAL LOW (ref 134–144)
eGFR: 66 mL/min/{1.73_m2} (ref >59)

## 2021-01-10 NOTE — Progress Notes (Signed)
PCP:  Carol Ada, MD Primary Cardiologist: Cristopher Peru, MD  Justin Trujillo is a 75 y.o. male with HTN, DM2, HLD, and tobacco abuse seen today for Dr. Lovena Le for routine electrophysiology followup.  Since last being seen in our clinic the patient reports doing about the same. He continues to have intermittent chest discomfort / epigastric pain. No clear aggravating or relieving factors. Not related to exertion. Happens at rest. He is mostly sedentary due to his back. He can stand unsupported for only 5-10 minutes before he is in too much pain to continue standing. Easily fatigues. He smokes 1.5 ppd. Denies SOB with ADLs. His back gives out prior to exertion causing SOB. No syncope. Pain has not awoken him from sleep. Denies edema.   Past Medical History:  Diagnosis Date  . Arthritis   . Coronary atherosclerosis of native coronary artery   . Depression   . Essential tremor   . High cholesterol   . Hypertension   . Hypertension   . Hypogonadism male   . Obstructive chronic bronchitis with exacerbation ([place])   . Obstructive sleep apnea (adult) (pediatric)    no cpap  . Other and unspecified hyperlipidemia   . Other emphysema ([place])   . Proteinuria 2019   has seen a nephrologist  . Shortness of breath    with excertion  . Situational stress   . Type II or unspecified type diabetes mellitus without mention of complication, not stated as uncontrolled    type 2  . Unspecified essential hypertension    Past Surgical History:  Procedure Laterality Date  . CARDIAC CATHETERIZATION    . IR KYPHO LUMBAR INC FX REDUCE BONE BX UNI/BIL CANNULATION INC/IMAGING  06/13/2018  . L lens implant    . R knee replacement x2    . REVERSE SHOULDER ARTHROPLASTY Left 03/26/2019   Procedure: REVERSE SHOULDER ARTHROPLASTY;  Surgeon: Justice Britain, MD;  Location: [facility];  Service: Orthopedics;  Laterality: Left;  159min  . TONSILLECTOMY    . TONSILLECTOMY AND ADENOIDECTOMY    . vascectomy      Current  Outpatient Medications  Medication Sig Dispense Refill  . acetaminophen (TYLENOL) 500 MG tablet Take 650 mg by mouth every 8 (eight) hours as needed (pain).    Marland Kitchen ALPRAZolam (XANAX) 0.5 MG tablet Take 0.5 mg by mouth 2 (two) times daily.    Marland Kitchen aspirin EC 81 MG tablet Take 81 mg by mouth daily.    Marland Kitchen atorvastatin (LIPITOR) 40 MG tablet Take 1 tablet (40 mg total) by mouth daily. 30 tablet 0  . Calcium Carb-Cholecalciferol (CALCIUM 600+D3 PO) Take 1 tablet by mouth daily.    . calcium citrate-vitamin D (CITRACAL+D) 315-200 MG-UNIT tablet daily in the afternoon.    . Cholecalciferol (VITAMIN D) 50 MCG (2000 UT) tablet Take 2,000 Units by mouth daily.    Marland Kitchen escitalopram (LEXAPRO) 10 MG tablet Take 10 mg by mouth daily.    Marland Kitchen gabapentin (NEURONTIN) 300 MG capsule Take 2 capsules (600 mg total) by mouth 3 (three) times daily. 90 capsule 5  . insulin isophane & regular human (NOVOLIN 70/30 FLEXPEN) (70-30) 100 UNIT/ML KwikPen as needed.    . Lidocaine 4 % PTCH as needed.    Marland Kitchen lisinopril-hydrochlorothiazide (ZESTORETIC) 20-12.5 MG tablet Take 1 tablet by mouth daily. 12.5 mg only    . metFORMIN (GLUCOPHAGE) 1000 MG tablet Take 1 tablet (1,000 mg total) by mouth 2 (two) times daily with a meal. 60 tablet 0  .  Multiple Vitamins-Minerals (CENTRUM SILVER) tablet daily in the afternoon.    Marland Kitchen MYRBETRIQ 50 MG TB24 tablet Take 50 mg by mouth daily.    . Oxycodone HCl 10 MG TABS daily as needed.    Marland Kitchen oxyCODONE-acetaminophen (PERCOCET) 10-325 MG tablet Take 1 tablet by mouth every 4 (four) hours as needed for pain. 20 tablet 0  . primidone (MYSOLINE) 50 MG tablet Take 1 tablet (50 mg total) by mouth at bedtime. 90 tablet 1  . rOPINIRole (REQUIP) 0.5 MG tablet Take 0.5 mg by mouth at bedtime.    . Semaglutide (OZEMPIC, 1 MG/DOSE, [place]) Inject 1 each into the skin once a week.    . tamsulosin (FLOMAX) 0.4 MG CAPS capsule Take 0.4 mg by mouth daily.    Marland Kitchen testosterone cypionate (DEPOTESTOSTERONE CYPIONATE) 200 MG/ML  injection Inject 200 mg into the muscle every 28 (twenty-eight) days. Every 2 weeks    . trolamine salicylate (ASPERCREME) 10 % cream Apply 1 application topically 2 (two) times daily as needed for muscle pain.      No current facility-administered medications for this visit.    Allergies  Allergen Reactions  . Iodinated Diagnostic Agents Anaphylaxis  . Moxifloxacin Swelling    REACTION: GI upset, throat "tightened up"  . Penicillins Anaphylaxis    Did it involve swelling of the face/tongue/throat, SOB, or low BP? Yes Did it involve sudden or severe rash/hives, skin peeling, or any reaction on the inside of your mouth or nose? No Did you need to seek medical attention at a hospital or doctor's office? Yes When did it last happen?been a while  If all above answers are "NO", may proceed with cephalosporin use.   Marland Kitchen Shellfish-Derived Products Anaphylaxis  . Latex Rash    Over long periods of time    Social History   Socioeconomic History  . Marital status: Married    Spouse name: Not on file  . Number of children: Not on file  . Years of education: Not on file  . Highest education level: Not on file  Occupational History  . Occupation: Landscape architect  Tobacco Use  . Smoking status: Current Every Day Smoker    Packs/day: 1.00    Years: 55.00    Pack years: 55.00    Types: Cigarettes  . Smokeless tobacco: Never Used  Vaping Use  . Vaping Use: Never used  Substance and Sexual Activity  . Alcohol use: Yes    Alcohol/week: 1.0 standard drink    Types: 1 Cans of beer per week    Comment: occasional beer  . Drug use: Never  . Sexual activity: Not Currently  Other Topics Concern  . Not on file  Social History Narrative  . Not on file   Social Determinants of Health   Financial Resource Strain: Not on file  Food Insecurity: Not on file  Transportation Needs: Not on file  Physical Activity: Not on file  Stress: Not on file  Social Connections: Not  on file  Intimate Partner Violence: Not on file     Review of Systems: General: No chills, fever, night sweats or weight changes  Cardiovascular:  No chest pain, dyspnea on exertion, edema, orthopnea, palpitations, paroxysmal nocturnal dyspnea Dermatological: No rash, lesions or masses Respiratory: No cough, dyspnea Urologic: No hematuria, dysuria Abdominal: No nausea, vomiting, diarrhea, bright red blood per rectum, melena, or hematemesis Neurologic: No visual changes, weakness, changes in mental status All other systems reviewed and are otherwise negative except as noted above.  Physical Exam: Vitals:   01/10/21 0926  BP: 98/68  Pulse: 97  SpO2: 93%  Weight: 211 lb (95.7 kg)  Height: 5\' 7"  (1.702 m)    GEN- The patient is well appearing, alert and oriented x 3 today.   HEENT: normocephalic, atraumatic; sclera clear, conjunctiva pink; hearing intact; oropharynx clear; neck supple, no JVP Lymph- no cervical lymphadenopathy Lungs- Clear to ausculation bilaterally, normal work of breathing.  No wheezes, rales, rhonchi Heart- Regular rate and rhythm, no murmurs, rubs or gallops, PMI not laterally displaced GI- soft, non-tender, non-distended, bowel sounds present, no hepatosplenomegaly Extremities- no clubbing, cyanosis, or edema; DP/PT/radial pulses 2+ bilaterally MS- no significant deformity or atrophy Skin- warm and dry, no rash or lesion Psych- euthymic mood, full affect Neuro- strength and sensation are intact  EKG is ordered. Personal review of EKG from today shows NSR at 93 bpm.  Additional studies reviewed include: Dr. Tanna Furry previous notes.  Assessment and Plan:  1. CAD Atypical chest pain with no clear exertional symptoms. He has multiple risk factors, and thus we will update his Myoview. If EF assessment remains depressed, will also order Echo to clarify.   2. HTN Continue current regimen  3. Obesity  Body mass index is 33.05 kg/m.   4. Tobacco  abuse Smoking 1.5 ppd. He understands the best thing he can do for his health is smoking cessation. He is not "mentally prepared" to consider this.   RTC 6 months. Sooner pending myoview results. Labs today.  Shirley Friar, PA-C  01/10/21 9:33 AM

## 2021-01-10 NOTE — Patient Instructions (Signed)
Medication Instructions:  Your physician recommends that you continue on your current medications as directed. Please refer to the Current Medication list given to you today.  *If you need a refill on your cardiac medications before your next appointment, please call your pharmacy*   Lab Work: TODAY: BMET  If you have labs (blood work) drawn today and your tests are completely normal, you will receive your results only by: Marland Kitchen MyChart Message (if you have MyChart) OR . A paper copy in the mail If you have any lab test that is abnormal or we need to change your treatment, we will call you to review the results.   Testing/Procedures: Your physician has requested that you have a lexiscan myoview. For further information please visit [email or web address]. Please follow instruction sheet, as given.  Follow-Up: At [facility], you and your health needs are our priority.  As part of our continuing mission to provide you with exceptional heart care, we have created designated Provider Care Teams.  These Care Teams include your primary Cardiologist (physician) and Advanced Practice Providers (APPs -  Physician Assistants and Nurse Practitioners) who all work together to provide you with the care you need, when you need it.  Your next appointment:   6 month(s)  The format for your next appointment:   In Person  Provider:   You may see Crissie Sickles, MD or one of the following Advanced Practice Providers on your designated Care Team:    Chanetta Marshall, NP  Tommye Standard, PA-C  Legrand Como "[place]" [place], [place]

## 2021-01-17 ENCOUNTER — Telehealth (HOSPITAL_COMMUNITY): Payer: Self-pay

## 2021-01-17 NOTE — Telephone Encounter (Signed)
Detailed instructions left on the patient's answering machine. Asked to call back with any questions. S.Modene Andy EMTP 

## 2021-01-19 ENCOUNTER — Other Ambulatory Visit: Payer: Self-pay

## 2021-01-19 ENCOUNTER — Ambulatory Visit (HOSPITAL_COMMUNITY): Payer: PPO | Attending: Cardiology

## 2021-01-19 DIAGNOSIS — R0789 Other chest pain: Secondary | ICD-10-CM

## 2021-01-19 MED ORDER — TECHNETIUM TC 99M TETROFOSMIN IV KIT
9.5000 | PACK | Freq: Once | INTRAVENOUS | Status: AC | PRN
Start: 2021-01-19 — End: 2021-01-19
  Administered 2021-01-19: 9.5 via INTRAVENOUS
  Filled 2021-01-19: qty 10

## 2021-01-24 ENCOUNTER — Other Ambulatory Visit: Payer: Self-pay | Admitting: Physician Assistant

## 2021-01-24 ENCOUNTER — Ambulatory Visit (HOSPITAL_COMMUNITY): Payer: PPO

## 2021-01-24 DIAGNOSIS — E1121 Type 2 diabetes mellitus with diabetic nephropathy: Secondary | ICD-10-CM | POA: Diagnosis not present

## 2021-01-24 DIAGNOSIS — R1319 Other dysphagia: Secondary | ICD-10-CM

## 2021-01-24 DIAGNOSIS — Z86010 Personal history of colonic polyps: Secondary | ICD-10-CM | POA: Diagnosis not present

## 2021-01-24 DIAGNOSIS — R0789 Other chest pain: Secondary | ICD-10-CM | POA: Diagnosis not present

## 2021-01-24 DIAGNOSIS — F17200 Nicotine dependence, unspecified, uncomplicated: Secondary | ICD-10-CM | POA: Diagnosis not present

## 2021-01-24 DIAGNOSIS — Z7984 Long term (current) use of oral hypoglycemic drugs: Secondary | ICD-10-CM | POA: Diagnosis not present

## 2021-01-25 ENCOUNTER — Other Ambulatory Visit: Payer: Self-pay

## 2021-01-25 ENCOUNTER — Ambulatory Visit (HOSPITAL_COMMUNITY): Payer: PPO | Attending: Cardiovascular Disease

## 2021-01-25 LAB — MYOCARDIAL PERFUSION IMAGING
LV dias vol: 89 mL (ref 62–150)
LV sys vol: 44 mL
Peak HR: 109 {beats}/min
Rest HR: 95 {beats}/min
SDS: 0
SRS: 0
SSS: 0
TID: 1.09

## 2021-01-25 MED ORDER — TECHNETIUM TC 99M TETROFOSMIN IV KIT
32.2000 | PACK | Freq: Once | INTRAVENOUS | Status: AC | PRN
  Administered 2021-01-25: 32.2 via INTRAVENOUS
  Filled 2021-01-25: qty 33

## 2021-01-25 MED ORDER — REGADENOSON 0.4 MG/5ML IV SOLN
0.4000 mg | Freq: Once | INTRAVENOUS | Status: AC
  Administered 2021-01-25: 0.4 mg via INTRAVENOUS

## 2021-01-26 DIAGNOSIS — M47816 Spondylosis without myelopathy or radiculopathy, lumbar region: Secondary | ICD-10-CM | POA: Diagnosis not present

## 2021-01-26 DIAGNOSIS — M4150 Other secondary scoliosis, site unspecified: Secondary | ICD-10-CM | POA: Diagnosis not present

## 2021-01-26 DIAGNOSIS — M62838 Other muscle spasm: Secondary | ICD-10-CM | POA: Diagnosis not present

## 2021-01-27 DIAGNOSIS — D485 Neoplasm of uncertain behavior of skin: Secondary | ICD-10-CM | POA: Diagnosis not present

## 2021-01-27 DIAGNOSIS — L84 Corns and callosities: Secondary | ICD-10-CM | POA: Diagnosis not present

## 2021-01-27 DIAGNOSIS — C4449 Other specified malignant neoplasm of skin of scalp and neck: Secondary | ICD-10-CM | POA: Diagnosis not present

## 2021-01-27 DIAGNOSIS — L570 Actinic keratosis: Secondary | ICD-10-CM | POA: Diagnosis not present

## 2021-02-03 ENCOUNTER — Telehealth: Payer: Self-pay | Admitting: Hematology and Oncology

## 2021-02-03 NOTE — Telephone Encounter (Signed)
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 [name] returned my call and has been scheduled to see Dr. Lorenso Courier on 5/23 at 2pm. Pt aware to arrive 20 minutes early.

## 2021-02-06 ENCOUNTER — Inpatient Hospital Stay: Payer: PPO | Attending: Hematology and Oncology

## 2021-02-06 ENCOUNTER — Other Ambulatory Visit: Payer: Self-pay

## 2021-02-06 ENCOUNTER — Inpatient Hospital Stay: Payer: PPO | Admitting: Hematology and Oncology

## 2021-02-06 ENCOUNTER — Encounter: Payer: Self-pay | Admitting: Hematology and Oncology

## 2021-02-06 VITALS — BP 122/83 | HR 100 | Temp 98.0°F | Resp 18 | Ht 67.0 in | Wt 206.3 lb

## 2021-02-06 DIAGNOSIS — E785 Hyperlipidemia, unspecified: Secondary | ICD-10-CM | POA: Insufficient documentation

## 2021-02-06 DIAGNOSIS — J441 Chronic obstructive pulmonary disease with (acute) exacerbation: Secondary | ICD-10-CM | POA: Diagnosis not present

## 2021-02-06 DIAGNOSIS — E119 Type 2 diabetes mellitus without complications: Secondary | ICD-10-CM | POA: Insufficient documentation

## 2021-02-06 DIAGNOSIS — C801 Malignant (primary) neoplasm, unspecified: Secondary | ICD-10-CM | POA: Insufficient documentation

## 2021-02-06 DIAGNOSIS — R1310 Dysphagia, unspecified: Secondary | ICD-10-CM | POA: Insufficient documentation

## 2021-02-06 DIAGNOSIS — I2510 Atherosclerotic heart disease of native coronary artery without angina pectoris: Secondary | ICD-10-CM | POA: Diagnosis not present

## 2021-02-06 DIAGNOSIS — G4733 Obstructive sleep apnea (adult) (pediatric): Secondary | ICD-10-CM | POA: Insufficient documentation

## 2021-02-06 DIAGNOSIS — R634 Abnormal weight loss: Secondary | ICD-10-CM | POA: Insufficient documentation

## 2021-02-06 DIAGNOSIS — Z125 Encounter for screening for malignant neoplasm of prostate: Secondary | ICD-10-CM | POA: Insufficient documentation

## 2021-02-06 DIAGNOSIS — R978 Other abnormal tumor markers: Secondary | ICD-10-CM | POA: Insufficient documentation

## 2021-02-06 DIAGNOSIS — Z794 Long term (current) use of insulin: Secondary | ICD-10-CM | POA: Diagnosis not present

## 2021-02-06 DIAGNOSIS — Z79899 Other long term (current) drug therapy: Secondary | ICD-10-CM | POA: Insufficient documentation

## 2021-02-06 DIAGNOSIS — F17210 Nicotine dependence, cigarettes, uncomplicated: Secondary | ICD-10-CM | POA: Insufficient documentation

## 2021-02-06 DIAGNOSIS — I10 Essential (primary) hypertension: Secondary | ICD-10-CM | POA: Diagnosis not present

## 2021-02-06 LAB — CBC WITH DIFFERENTIAL (CANCER CENTER ONLY)
Abs Immature Granulocytes: 0.04 10*3/uL (ref 0.00–0.07)
Basophils Absolute: 0.1 10*3/uL (ref 0.0–0.1)
Basophils Relative: 1 %
Eosinophils Absolute: 0.2 10*3/uL (ref 0.0–0.5)
Eosinophils Relative: 2 %
HCT: 47.5 % (ref 39.0–52.0)
Hemoglobin: 15.2 g/dL (ref 13.0–17.0)
Immature Granulocytes: 0 %
Lymphocytes Relative: 18 %
Lymphs Abs: 2.2 10*3/uL (ref 0.7–4.0)
MCH: 28 pg (ref 26.0–34.0)
MCHC: 32 g/dL (ref 30.0–36.0)
MCV: 87.6 fL (ref 80.0–100.0)
Monocytes Absolute: 0.8 10*3/uL (ref 0.1–1.0)
Monocytes Relative: 7 %
Neutro Abs: 8.6 10*3/uL — ABNORMAL HIGH (ref 1.7–7.7)
Neutrophils Relative %: 72 %
Platelet Count: 269 10*3/uL (ref 150–400)
RBC: 5.42 MIL/uL (ref 4.22–5.81)
RDW: 13.2 % (ref 11.5–15.5)
WBC Count: 11.9 10*3/uL — ABNORMAL HIGH (ref 4.0–10.5)
nRBC: 0 % (ref 0.0–0.2)

## 2021-02-06 LAB — CMP (CANCER CENTER ONLY)
ALT: 7 U/L (ref 0–44)
AST: 14 U/L — ABNORMAL LOW (ref 15–41)
Albumin: 4.1 g/dL (ref 3.5–5.0)
Alkaline Phosphatase: 82 U/L (ref 38–126)
Anion gap: 9 (ref 5–15)
BUN: 12 mg/dL (ref 8–23)
CO2: 28 mmol/L (ref 22–32)
Calcium: 10 mg/dL (ref 8.9–10.3)
Chloride: 97 mmol/L — ABNORMAL LOW (ref 98–111)
Creatinine: 0.96 mg/dL (ref 0.61–1.24)
GFR, Estimated: >60 mL/min (ref >60)
Glucose, Bld: 80 mg/dL (ref 70–99)
Potassium: 4.3 mmol/L (ref 3.5–5.1)
Sodium: 134 mmol/L — ABNORMAL LOW (ref 135–145)
Total Bilirubin: 0.5 mg/dL (ref 0.3–1.2)
Total Protein: 7.4 g/dL (ref 6.5–8.1)

## 2021-02-06 LAB — LACTATE DEHYDROGENASE: LDH: 140 U/L (ref 98–192)

## 2021-02-06 NOTE — Progress Notes (Signed)
Call made to Dr. Watt Climes to see if he is able to do endoscopy soon on this patient.   Spoke with Dr. Perley Jain nurse and she will get this scheduled ASAP and call the patient and myself for date and time

## 2021-02-06 NOTE — Progress Notes (Signed)
[phone]   Fax:([phone] [place] NOTE  Patient Care Team: Carol Ada, MD as PCP - General (Family Medicine) Evans Lance, MD as PCP - Cardiology (Cardiology)  Hematological/Oncological History # Metastatic Adenocarcinoma of Unclear Origin 01/27/2021: dermatology performed a punch biopsy of the right superior lateral anterior neck. Finding show adenocarcinoma negative for PSA and cytokeratin 20. Possible pulmonary origin 02/06/2021: establish care with Dr. Lorenso Courier   CHIEF COMPLAINTS/PURPOSE OF CONSULTATION:  "Metastatic Adenocarcinoma, unclear origin "  HISTORY OF PRESENTING ILLNESS:  Justin Trujillo 75 y.o. male with medical history significant for DM type II, HTN, HLD, OSA, and empysema who presents for evaluation of metastatic adenocarcinoma of unclear origin.  On review of the previous records [name] was referred by dermatology after the performed punch biopsy on 01/27/2021 which showed adenocarcinoma of the right superior lateral anterior neck.  Due to concerns for this finding the patient was referred to oncology for further evaluation and management.  On exam today [name] is accompanied by his wife.  He reports that he developed what he believes was a "cyst" on his right ear which is what dermatology biopsy.  He notes that when he attempted to lance it a realize it was not a cyst and pursued a punch biopsy which help confirm diagnosis.  [name] notes that he has no prior history of any malignancy.  He has been having issues recently with swallowing difficulty and reports that it is "gotten much worse".  He is now only eating soft foods and drinking soup and liquids.  He notes that this "knot" has been present for at least 1 month and has been progressively worsening.  He is also had significant weight loss during this time with approximately 10 pounds of weight loss.  Of note he did recently start Ozempic approximately  3 months ago.  On further discussion [name] reports that he is currently a smoker has been smoking about 1.5 packs/day for 35 years.  He reports that he does drink alcohol but is "never been a heavy drinker".  He previously served in the [employer] in [place] from [birth year]-[age] though he reports agent orange was not in use at the time where he was in [place].  He currently denies any fevers, chills, sweats, nausea, vomiting or diarrhea.  He denies any dark stools.  A full 10 point ROS is listed below.  MEDICAL HISTORY:  Past Medical History:  Diagnosis Date  . Arthritis   . Coronary atherosclerosis of native coronary artery   . Depression   . Essential tremor   . High cholesterol   . Hypertension   . Hypertension   . Hypogonadism male   . Obstructive chronic bronchitis with exacerbation ([place])   . Obstructive sleep apnea (adult) (pediatric)    no cpap  . Other and unspecified hyperlipidemia   . Other emphysema ([place])   . Proteinuria 2019   has seen a nephrologist  . Shortness of breath    with excertion  . Situational stress   . Type II or unspecified type diabetes mellitus without mention of complication, not stated as uncontrolled    type 2  . Unspecified essential hypertension     SURGICAL HISTORY: Past Surgical History:  Procedure Laterality Date  . CARDIAC CATHETERIZATION    . IR KYPHO LUMBAR INC FX REDUCE BONE BX UNI/BIL CANNULATION INC/IMAGING  06/13/2018  . L lens implant    . R knee replacement x2    .  REVERSE SHOULDER ARTHROPLASTY Left 03/26/2019   Procedure: REVERSE SHOULDER ARTHROPLASTY;  Surgeon: Justice Britain, MD;  Location: [facility];  Service: Orthopedics;  Laterality: Left;  154min  . TONSILLECTOMY    . TONSILLECTOMY AND ADENOIDECTOMY    . vascectomy      SOCIAL HISTORY: Social History   Socioeconomic History  . Marital status: Married    Spouse name: Not on file  . Number of children: Not on file  . Years of education: Not on file  . Highest education  level: Not on file  Occupational History  . Occupation: Landscape architect  Tobacco Use  . Smoking status: Current Every Day Smoker    Packs/day: 1.00    Years: 55.00    Pack years: 55.00    Types: Cigarettes  . Smokeless tobacco: Never Used  Vaping Use  . Vaping Use: Never used  Substance and Sexual Activity  . Alcohol use: Yes    Alcohol/week: 1.0 standard drink    Types: 1 Cans of beer per week    Comment: occasional beer  . Drug use: Never  . Sexual activity: Not Currently  Other Topics Concern  . Not on file  Social History Narrative  . Not on file   Social Determinants of Health   Financial Resource Strain: Not on file  Food Insecurity: Not on file  Transportation Needs: Not on file  Physical Activity: Not on file  Stress: Not on file  Social Connections: Not on file  Intimate Partner Violence: Not on file    FAMILY HISTORY: Family History  Problem Relation Age of Onset  . Emphysema Father   . Heart disease Father   . Clotting disorder Mother   . Kidney cancer Child     ALLERGIES:  is allergic to iodinated diagnostic agents, moxifloxacin, penicillins, shellfish-derived products, amoxicillin, other, and latex.  MEDICATIONS:  Current Outpatient Medications  Medication Sig Dispense Refill  . acetaminophen (TYLENOL) 500 MG tablet Take 650 mg by mouth every 8 (eight) hours as needed (pain).    Marland Kitchen ALPRAZolam (XANAX) 0.5 MG tablet Take 0.5 mg by mouth 2 (two) times daily.    Marland Kitchen aspirin EC 81 MG tablet Take 81 mg by mouth daily.    Marland Kitchen atorvastatin (LIPITOR) 40 MG tablet Take 1 tablet (40 mg total) by mouth daily. 30 tablet 0  . Calcium Carb-Cholecalciferol (CALCIUM 600+D3 PO) Take 1 tablet by mouth daily.    . calcium citrate-vitamin D (CITRACAL+D) 315-200 MG-UNIT tablet daily in the afternoon.    . Cholecalciferol (VITAMIN D) 50 MCG (2000 UT) tablet Take 2,000 Units by mouth daily.    Marland Kitchen escitalopram (LEXAPRO) 10 MG tablet Take 10 mg by mouth daily.     Marland Kitchen gabapentin (NEURONTIN) 300 MG capsule Take 2 capsules (600 mg total) by mouth 3 (three) times daily. 90 capsule 5  . insulin isophane & regular human (NOVOLIN 70/30 FLEXPEN) (70-30) 100 UNIT/ML KwikPen as needed. (Patient not taking: Reported on 02/06/2021)    . Lidocaine 4 % PTCH as needed.    Marland Kitchen lisinopril-hydrochlorothiazide (ZESTORETIC) 20-12.5 MG tablet Take 1 tablet by mouth daily. 12.5 mg only    . metFORMIN (GLUCOPHAGE) 1000 MG tablet Take 1 tablet (1,000 mg total) by mouth 2 (two) times daily with a meal. 60 tablet 0  . Multiple Vitamins-Minerals (CENTRUM SILVER) tablet daily in the afternoon.    Marland Kitchen MYRBETRIQ 50 MG TB24 tablet Take 50 mg by mouth daily.    Marland Kitchen oxyCODONE-acetaminophen (PERCOCET) 10-325 MG tablet Take  1 tablet by mouth every 4 (four) hours as needed for pain. 20 tablet 0  . primidone (MYSOLINE) 50 MG tablet Take 1 tablet (50 mg total) by mouth at bedtime. 90 tablet 1  . rOPINIRole (REQUIP) 0.5 MG tablet Take 0.5 mg by mouth at bedtime.    . Semaglutide (OZEMPIC, 1 MG/DOSE, [place]) Inject 1 each into the skin once a week.    . tamsulosin (FLOMAX) 0.4 MG CAPS capsule Take 0.4 mg by mouth daily.    Marland Kitchen testosterone cypionate (DEPOTESTOSTERONE CYPIONATE) 200 MG/ML injection Inject 200 mg into the muscle every 28 (twenty-eight) days. Every 2 weeks    . tiZANidine (ZANAFLEX) 4 MG tablet SMARTSIG:1 Tablet(s) By Mouth 1 to 3 Times Daily PRN    . trolamine salicylate (ASPERCREME) 10 % cream Apply 1 application topically 2 (two) times daily as needed for muscle pain.      No current facility-administered medications for this visit.    REVIEW OF SYSTEMS:   Constitutional: ( - ) fevers, ( - )  chills , ( - ) night sweats Eyes: ( - ) blurriness of vision, ( - ) double vision, ( - ) watery eyes Ears, nose, mouth, throat, and face: ( - ) mucositis, ( - ) sore throat Respiratory: ( - ) cough, ( - ) dyspnea, ( - ) wheezes Cardiovascular: ( - ) palpitation, ( - ) chest discomfort, ( - ) lower  extremity swelling Gastrointestinal:  ( - ) nausea, ( - ) heartburn, ( - ) change in bowel habits Skin: ( - ) abnormal skin rashes Lymphatics: ( - ) new lymphadenopathy, ( - ) easy bruising Neurological: ( - ) numbness, ( - ) tingling, ( - ) new weaknesses Behavioral/Psych: ( - ) mood change, ( - ) new changes  All other systems were reviewed with the patient and are negative.  PHYSICAL EXAMINATION: ECOG PERFORMANCE STATUS: 1 - Symptomatic but completely ambulatory  Vitals:   02/06/21 1405  BP: 122/83  Pulse: 100  Resp: 18  Temp: 98 F (36.7 C)  SpO2: 96%   Filed Weights   02/06/21 1405  Weight: 206 lb 4.8 oz (93.6 kg)    GENERAL: well appearing elderly Caucasian male in NAD  SKIN: skin color, texture, turgor are normal, no rashes or significant lesions EYES: conjunctiva are pink and non-injected, sclera clear LUNGS: clear to auscultation and percussion with normal breathing effort HEART: regular rate & rhythm and no murmurs and no lower extremity edema Musculoskeletal: no cyanosis of digits and no clubbing  PSYCH: alert & oriented x 3, fluent speech NEURO: no focal motor/sensory deficits  LABORATORY DATA:  I have reviewed the data as listed CBC Latest Ref Rng & Units 02/06/2021 03/23/2019 10/29/2018  WBC 4.0 - 10.5 K/uL 11.9(H) 10.7(H) 10.3  Hemoglobin 13.0 - 17.0 g/dL 15.2 14.8 11.6(L)  Hematocrit 39.0 - 52.0 % 47.5 45.0 36.2(L)  Platelets 150 - 400 K/uL 269 194 159    CMP Latest Ref Rng & Units 02/06/2021 01/10/2021 03/23/2019  Glucose 70 - 99 mg/dL 80 99 94  BUN 8 - 23 mg/dL 12 16 23   Creatinine 0.61 - 1.24 mg/dL 0.96 1.15 0.91  Sodium 135 - 145 mmol/L 134(L) 133(L) 138  Potassium 3.5 - 5.1 mmol/L 4.3 4.8 4.7  Chloride 98 - 111 mmol/L 97(L) 93(L) 104  CO2 22 - 32 mmol/L 28 24 26   Calcium 8.9 - 10.3 mg/dL 10.0 10.2 9.5  Total Protein 6.5 - 8.1 g/dL 7.4 - -  Total  Bilirubin 0.3 - 1.2 mg/dL 0.5 - -  Alkaline Phos 38 - 126 U/L 82 - -  AST 15 - 41 U/L 14(L) - -  ALT 0 -  44 U/L 7 - -     PATHOLOGY:    RADIOGRAPHIC STUDIES: MYOCARDIAL PERFUSION IMAGING  Result Date: 01/25/2021  The left ventricular ejection fraction is mildly decreased (45-54%).  Nuclear stress EF: 51%.  There was no ST segment deviation noted during stress.  The study is normal.  This is a low risk study.  Low risk stress nuclear study with normal perfusion and low normal left ventricular global systolic function. The previously described LV apical defect is no longer seen. LVEF has improved slightly.   ASSESSMENT & PLAN KYI ROMANELLO 75 y.o. male with medical history significant for DM type II, HTN, HLD, OSA, and emphysema who presents for evaluation of metastatic adenocarcinoma of unclear origin.  After review of the labs, review of the records, and discussion with the patient the patients findings are most consistent with metastatic adenocarcinoma of unclear etiology.  At this time are focused is on the edification of the primary tumor.  The tumor pathologist noted stains were not consistent with prostate cancer or colon cancer, and that lung adenocarcinoma with high differential.  Given his significant smoking history this a certain possibility.  We will perform a PET CT scan in order to help identify sites of disease and determine there is another site that is clearly the primary.  PET CT scan preferredgiven the patient's severe contrast allergy and the national shortage of contrast.  In the event that the PET CT scan did not identify a clear primary we will need to consider alternative forms of imaging.  # Metastatic Adenocarcinoma of Unclear Origin -- Ordering PET CT scan to help identify the origin of his adenocarcinoma. -- At this time would favor lung or adeno squamous carcinoma of the skin -- We will order tumor markers to help you with a headstart with PSA, CEA, CA 19-9, and LDH -- Return to clinic following results of PET CT scan to discuss steps moving forward.  Orders  Placed This Encounter  Procedures  . NM PET Image Initial (PI) Skull Base To Thigh    Standing Status:   Future    Standing Expiration Date:   02/06/2022    Order Specific Question:   If indicated for the ordered procedure, I authorize the administration of a radiopharmaceutical per Radiology protocol    Answer:   Yes    Order Specific Question:   Preferred imaging location?    Answer:   [facility]  . CBC with Differential (Cancer Center Only)    Standing Status:   Future    Number of Occurrences:   1    Standing Expiration Date:   02/06/2022  . CMP ([facility] only)    Standing Status:   Future    Number of Occurrences:   1    Standing Expiration Date:   02/06/2022  . CA 19.9    Standing Status:   Future    Number of Occurrences:   1    Standing Expiration Date:   02/06/2022  . CEA (IN HOUSE-[facility])FOR [facility] ONLY    Standing Status:   Future    Number of Occurrences:   1    Standing Expiration Date:   02/06/2022  . Prostate-Specific AG, Serum    Standing Status:   Future    Number of Occurrences:  1    Standing Expiration Date:   02/06/2022  . Lactate dehydrogenase (LDH)    Standing Status:   Future    Number of Occurrences:   1    Standing Expiration Date:   02/06/2022    All questions were answered. The patient knows to call the clinic with any problems, questions or concerns.  A total of more than 60 minutes were spent on this encounter with face-to-face time and non-face-to-face time, including preparing to see the patient, ordering tests and/or medications, counseling the patient and coordination of care as outlined above.   Ledell Peoples, MD Department of Hematology/Oncology [place] at [facility] Phone: [phone] Pager: [phone] Email: [name]  02/07/2021 1:19 PM

## 2021-02-07 ENCOUNTER — Encounter: Payer: Self-pay | Admitting: Hematology and Oncology

## 2021-02-07 LAB — CANCER ANTIGEN 19-9: CA 19-9: 46 U/mL — ABNORMAL HIGH (ref 0–35)

## 2021-02-07 LAB — PROSTATE-SPECIFIC AG, SERUM (LABCORP): Prostate Specific Ag, Serum: 0.8 ng/mL (ref 0.0–4.0)

## 2021-02-07 LAB — CEA (IN HOUSE-CHCC): CEA (CHCC-In House): 2.07 ng/mL (ref 0.00–5.00)

## 2021-02-09 ENCOUNTER — Telehealth: Payer: Self-pay | Admitting: Hematology and Oncology

## 2021-02-09 NOTE — Telephone Encounter (Signed)
Scheduled per los. Called and left msg. Mailed printout  °

## 2021-02-14 ENCOUNTER — Other Ambulatory Visit: Payer: PPO

## 2021-02-15 DIAGNOSIS — K31819 Angiodysplasia of stomach and duodenum without bleeding: Secondary | ICD-10-CM | POA: Diagnosis not present

## 2021-02-15 DIAGNOSIS — R1310 Dysphagia, unspecified: Secondary | ICD-10-CM | POA: Diagnosis not present

## 2021-02-15 DIAGNOSIS — C159 Malignant neoplasm of esophagus, unspecified: Secondary | ICD-10-CM | POA: Diagnosis not present

## 2021-02-22 ENCOUNTER — Other Ambulatory Visit: Payer: Self-pay

## 2021-02-22 ENCOUNTER — Ambulatory Visit (HOSPITAL_COMMUNITY)
Admission: RE | Admit: 2021-02-22 | Discharge: 2021-02-22 | Disposition: A | Discharge status: Home / Self Care | Payer: PPO | Source: Ambulatory Visit | Attending: Hematology and Oncology | Admitting: Hematology and Oncology

## 2021-02-22 DIAGNOSIS — C801 Malignant (primary) neoplasm, unspecified: Secondary | ICD-10-CM | POA: Diagnosis not present

## 2021-02-22 LAB — GLUCOSE, CAPILLARY: Glucose-Capillary: 92 mg/dL (ref 70–99)

## 2021-02-22 MED ORDER — FLUDEOXYGLUCOSE F - 18 (FDG) INJECTION
10.4000 | Freq: Once | INTRAVENOUS | Status: AC | PRN
  Administered 2021-02-22: 9.8 via INTRAVENOUS

## 2021-02-23 DIAGNOSIS — C159 Malignant neoplasm of esophagus, unspecified: Secondary | ICD-10-CM | POA: Diagnosis not present

## 2021-03-02 ENCOUNTER — Telehealth: Payer: Self-pay

## 2021-03-02 NOTE — Telephone Encounter (Signed)
Requested additional testing on [id number] per Dr. Dow Adolph, Crystal and HER2 via fax to [phone] on 03/01/2021

## 2021-03-03 ENCOUNTER — Inpatient Hospital Stay: Payer: PPO | Attending: Hematology and Oncology | Admitting: Hematology and Oncology

## 2021-03-03 ENCOUNTER — Inpatient Hospital Stay: Payer: PPO

## 2021-03-03 ENCOUNTER — Other Ambulatory Visit: Payer: Self-pay

## 2021-03-03 ENCOUNTER — Other Ambulatory Visit: Payer: Self-pay | Admitting: Hematology and Oncology

## 2021-03-03 VITALS — BP 113/67 | HR 93 | Temp 97.8°F | Resp 20 | Ht 67.0 in | Wt 194.1 lb

## 2021-03-03 DIAGNOSIS — E7800 Pure hypercholesterolemia, unspecified: Secondary | ICD-10-CM | POA: Diagnosis not present

## 2021-03-03 DIAGNOSIS — C159 Malignant neoplasm of esophagus, unspecified: Secondary | ICD-10-CM | POA: Diagnosis not present

## 2021-03-03 DIAGNOSIS — N400 Enlarged prostate without lower urinary tract symptoms: Secondary | ICD-10-CM | POA: Diagnosis not present

## 2021-03-03 DIAGNOSIS — Z7984 Long term (current) use of oral hypoglycemic drugs: Secondary | ICD-10-CM | POA: Insufficient documentation

## 2021-03-03 DIAGNOSIS — E785 Hyperlipidemia, unspecified: Secondary | ICD-10-CM | POA: Insufficient documentation

## 2021-03-03 DIAGNOSIS — Z79899 Other long term (current) drug therapy: Secondary | ICD-10-CM | POA: Insufficient documentation

## 2021-03-03 DIAGNOSIS — I10 Essential (primary) hypertension: Secondary | ICD-10-CM | POA: Diagnosis not present

## 2021-03-03 DIAGNOSIS — Z7982 Long term (current) use of aspirin: Secondary | ICD-10-CM | POA: Diagnosis not present

## 2021-03-03 DIAGNOSIS — I712 Thoracic aortic aneurysm, without rupture: Secondary | ICD-10-CM | POA: Insufficient documentation

## 2021-03-03 DIAGNOSIS — C801 Malignant (primary) neoplasm, unspecified: Secondary | ICD-10-CM

## 2021-03-03 DIAGNOSIS — C155 Malignant neoplasm of lower third of esophagus: Secondary | ICD-10-CM | POA: Insufficient documentation

## 2021-03-03 DIAGNOSIS — E119 Type 2 diabetes mellitus without complications: Secondary | ICD-10-CM | POA: Diagnosis not present

## 2021-03-03 LAB — CBC WITH DIFFERENTIAL (CANCER CENTER ONLY)
Abs Immature Granulocytes: 0.03 10*3/uL (ref 0.00–0.07)
Basophils Absolute: 0.1 10*3/uL (ref 0.0–0.1)
Basophils Relative: 1 %
Eosinophils Absolute: 0.2 10*3/uL (ref 0.0–0.5)
Eosinophils Relative: 3 %
HCT: 47.9 % (ref 39.0–52.0)
Hemoglobin: 15.4 g/dL (ref 13.0–17.0)
Immature Granulocytes: 0 %
Lymphocytes Relative: 23 %
Lymphs Abs: 2.2 10*3/uL (ref 0.7–4.0)
MCH: 28 pg (ref 26.0–34.0)
MCHC: 32.2 g/dL (ref 30.0–36.0)
MCV: 87.1 fL (ref 80.0–100.0)
Monocytes Absolute: 0.6 10*3/uL (ref 0.1–1.0)
Monocytes Relative: 7 %
Neutro Abs: 6.3 10*3/uL (ref 1.7–7.7)
Neutrophils Relative %: 66 %
Platelet Count: 258 10*3/uL (ref 150–400)
RBC: 5.5 MIL/uL (ref 4.22–5.81)
RDW: 14.6 % (ref 11.5–15.5)
WBC Count: 9.4 10*3/uL (ref 4.0–10.5)
nRBC: 0 % (ref 0.0–0.2)

## 2021-03-03 LAB — CMP (CANCER CENTER ONLY)
ALT: 10 U/L (ref 0–44)
AST: 19 U/L (ref 15–41)
Albumin: 4.1 g/dL (ref 3.5–5.0)
Alkaline Phosphatase: 80 U/L (ref 38–126)
Anion gap: 10 (ref 5–15)
BUN: 10 mg/dL (ref 8–23)
CO2: 27 mmol/L (ref 22–32)
Calcium: 9.6 mg/dL (ref 8.9–10.3)
Chloride: 97 mmol/L — ABNORMAL LOW (ref 98–111)
Creatinine: 1.02 mg/dL (ref 0.61–1.24)
GFR, Estimated: >60 mL/min (ref >60)
Glucose, Bld: 86 mg/dL (ref 70–99)
Potassium: 4.1 mmol/L (ref 3.5–5.1)
Sodium: 134 mmol/L — ABNORMAL LOW (ref 135–145)
Total Bilirubin: 0.7 mg/dL (ref 0.3–1.2)
Total Protein: 7.1 g/dL (ref 6.5–8.1)

## 2021-03-03 NOTE — [place] Notes (Signed)
Met with patient and his wife Leda Gauze today at follow up with Dr. Lorenso Courier.  I introduced myself and explained my role as nurse navigator.  They were given my direct contact information.  He currently has increasing dysphagia stating getting very little solids down.  Has been supplementing with Ensure and protein shakes.  I explained the importance of supplementing his diet with Ensure Plus up to three per day to try to get enough caloric intake along with soups and broths.  I have made a referral to our nutritionist for on-going support.  I have also made a referral to Dr. Lisbeth Renshaw in radiation oncology.  I did review with them that with radiation treatment that his swallowing may get more difficult before getting better.  He has a very optimistic outlook and appreciates knowing what to expect.

## 2021-03-03 NOTE — Progress Notes (Signed)
[place] [phone]   [phone]  PROGRESS NOTE  Patient Care Team: Carol Ada, MD as PCP - General (Family Medicine) Evans Lance, MD as PCP - Cardiology (Cardiology)  Hematological/Oncological History # Metastatic Adenocarcinoma of the Distal Esophagus 01/27/2021: dermatology performed a punch biopsy of the right superior lateral anterior neck. Finding show adenocarcinoma negative for PSA and cytokeratin 20.  02/06/2021: establish care with Dr. Lorenso Courier  02/15/2021: EGD found esophageal mass. Biopsy confirms poorly differentiated carcinoma 02/22/2021: PET CT scan shows FDG avid mass in distal esophagus and subcutaneous tissue or right neck.   Interval History:  Justin Trujillo 75 y.o. male with medical history significant for metastatic esophageal adenocarcinoma who presents for a follow up visit. The patient's last visit was on 02/06/2021 at which time he established care. In the interim since the last visit he underwent PET CT scan which showed FDG avid distal esophagus and had an EGD showing a mass which when biopsied confirmed adenocarcinoma of the distal esophagus.   On exam today Justin Trujillo is accompanied by his wife.  Unfortunately his weight continues to drop and he continues to have difficulty tolerating solid foods.  He is eating some mashed potatoes but is mostly on a liquid diet.  He reports that his weakness is worsening and his weight has now declined to 194 pounds.  He notes that with the weight loss his back pain is improving.  He otherwise denies any nausea, ming, or diarrhea.  A full 10 point ROS is listed below.  MEDICAL HISTORY:  Past Medical History:  Diagnosis Date   Arthritis    Coronary atherosclerosis of native coronary artery    Depression    Essential tremor    High cholesterol    Hypertension    Hypertension    Hypogonadism male    Obstructive chronic bronchitis with exacerbation (HCC)    Obstructive sleep apnea  (adult) (pediatric)    no cpap   Other and unspecified hyperlipidemia    Other emphysema ([place])    Proteinuria 2019   has seen a nephrologist   Shortness of breath    with excertion   Situational stress    Type II or unspecified type diabetes mellitus without mention of complication, not stated as uncontrolled    type 2   Unspecified essential hypertension     SURGICAL HISTORY: Past Surgical History:  Procedure Laterality Date   CARDIAC CATHETERIZATION     IR KYPHO LUMBAR INC FX REDUCE BONE BX UNI/BIL CANNULATION INC/IMAGING  06/13/2018   L lens implant     R knee replacement x2     REVERSE SHOULDER ARTHROPLASTY Left 03/26/2019   Procedure: REVERSE SHOULDER ARTHROPLASTY;  Surgeon: Justice Britain, MD;  Location: [facility];  Service: Orthopedics;  Laterality: Left;  133mn   TONSILLECTOMY     TONSILLECTOMY AND ADENOIDECTOMY     vascectomy      SOCIAL HISTORY: Social History   Socioeconomic History   Marital status: Married    Spouse name: Not on file   Number of children: Not on file   Years of education: Not on file   Highest education level: Not on file  Occupational History   Occupation: lLandscape architect Tobacco Use   Smoking status: Every Day    Packs/day: 1.00    Years: 55.00    Pack years: 55.00    Types: Cigarettes   Smokeless tobacco: Never  Vaping Use   Vaping Use: Never used  Substance and Sexual Activity   Alcohol use: Yes    Alcohol/week: 1.0 standard drink    Types: 1 Cans of beer per week    Comment: occasional beer   Drug use: Never   Sexual activity: Not Currently  Other Topics Concern   Not on file  Social History Narrative   Not on file   Social Determinants of Health   Financial Resource Strain: Not on file  Food Insecurity: Not on file  Transportation Needs: Not on file  Physical Activity: Not on file  Stress: Not on file  Social Connections: Not on file  Intimate Partner Violence: Not on file    FAMILY HISTORY: Family  History  Problem Relation Age of Onset   Emphysema Father    Heart disease Father    Clotting disorder Mother    Kidney cancer Child     ALLERGIES:  is allergic to iodinated diagnostic agents, moxifloxacin, penicillins, shellfish-derived products, amoxicillin, other, and latex.  MEDICATIONS:  Current Outpatient Medications  Medication Sig Dispense Refill   acetaminophen (TYLENOL) 500 MG tablet Take 650 mg by mouth every 8 (eight) hours as needed (pain).     ALPRAZolam (XANAX) 0.5 MG tablet Take 0.5 mg by mouth 2 (two) times daily.     aspirin EC 81 MG tablet Take 81 mg by mouth daily.     atorvastatin (LIPITOR) 40 MG tablet Take 1 tablet (40 mg total) by mouth daily. 30 tablet 0   Calcium Carb-Cholecalciferol (CALCIUM 600+D3 PO) Take 1 tablet by mouth daily.     calcium citrate-vitamin D (CITRACAL+D) 315-200 MG-UNIT tablet daily in the afternoon.     Cholecalciferol (VITAMIN D) 50 MCG (2000 UT) tablet Take 2,000 Units by mouth daily.     escitalopram (LEXAPRO) 10 MG tablet Take 10 mg by mouth daily.     gabapentin (NEURONTIN) 300 MG capsule Take 2 capsules (600 mg total) by mouth 3 (three) times daily. 90 capsule 5   insulin isophane & regular human (NOVOLIN 70/30 FLEXPEN) (70-30) 100 UNIT/ML KwikPen as needed. (Patient not taking: Reported on 02/06/2021)     Lidocaine 4 % PTCH as needed.     lisinopril-hydrochlorothiazide (ZESTORETIC) 20-12.5 MG tablet Take 1 tablet by mouth daily. 12.5 mg only     metFORMIN (GLUCOPHAGE) 1000 MG tablet Take 1 tablet (1,000 mg total) by mouth 2 (two) times daily with a meal. 60 tablet 0   Multiple Vitamins-Minerals (CENTRUM SILVER) tablet daily in the afternoon.     MYRBETRIQ 50 MG TB24 tablet Take 50 mg by mouth daily.     oxyCODONE-acetaminophen (PERCOCET) 10-325 MG tablet Take 1 tablet by mouth every 4 (four) hours as needed for pain. 20 tablet 0   primidone (MYSOLINE) 50 MG tablet Take 1 tablet (50 mg total) by mouth at bedtime. 90 tablet 1    rOPINIRole (REQUIP) 0.5 MG tablet Take 0.5 mg by mouth at bedtime.     Semaglutide (OZEMPIC, 1 MG/DOSE, [place]) Inject 1 each into the skin once a week.     tamsulosin (FLOMAX) 0.4 MG CAPS capsule Take 0.4 mg by mouth daily.     testosterone cypionate (DEPOTESTOSTERONE CYPIONATE) 200 MG/ML injection Inject 200 mg into the muscle every 28 (twenty-eight) days. Every 2 weeks     tiZANidine (ZANAFLEX) 4 MG tablet SMARTSIG:1 Tablet(s) By Mouth 1 to 3 Times Daily PRN     trolamine salicylate (ASPERCREME) 10 % cream Apply 1 application topically 2 (two) times daily as needed for muscle pain.  No current facility-administered medications for this visit.    REVIEW OF SYSTEMS:   Constitutional: ( - ) fevers, ( - )  chills , ( - ) night sweats Eyes: ( - ) blurriness of vision, ( - ) double vision, ( - ) watery eyes Ears, nose, mouth, throat, and face: ( - ) mucositis, ( - ) sore throat Respiratory: ( - ) cough, ( - ) dyspnea, ( - ) wheezes Cardiovascular: ( - ) palpitation, ( - ) chest discomfort, ( - ) lower extremity swelling Gastrointestinal:  ( - ) nausea, ( - ) heartburn, ( - ) change in bowel habits Skin: ( - ) abnormal skin rashes Lymphatics: ( - ) new lymphadenopathy, ( - ) easy bruising Neurological: ( - ) numbness, ( - ) tingling, ( - ) new weaknesses Behavioral/Psych: ( - ) mood change, ( - ) new changes  All other systems were reviewed with the patient and are negative.  PHYSICAL EXAMINATION: ECOG PERFORMANCE STATUS: 1 - Symptomatic but completely ambulatory  Vitals:   03/03/21 1015  BP: 113/67  Pulse: 93  Resp: 20  Temp: 97.8 F (36.6 C)  SpO2: 97%   Filed Weights   03/03/21 1015  Weight: 194 lb 1.6 oz (88 kg)    GENERAL: alert, no distress and comfortable SKIN: skin color, texture, turgor are normal, no rashes or significant lesions EYES: conjunctiva are pink and non-injected, sclera clear OROPHARYNX: no exudate, no erythema; lips, buccal mucosa, and tongue normal   NECK: supple, non-tender LYMPH:  no palpable lymphadenopathy in the cervical, axillary or inguinal LUNGS: clear to auscultation and percussion with normal breathing effort HEART: regular rate & rhythm and no murmurs and no lower extremity edema ABDOMEN: soft, non-tender, non-distended, normal bowel sounds Musculoskeletal: no cyanosis of digits and no clubbing  PSYCH: alert & oriented x 3, fluent speech NEURO: no focal motor/sensory deficits  LABORATORY DATA:  I have reviewed the data as listed CBC Latest Ref Rng & Units 03/03/2021 02/06/2021 03/23/2019  WBC 4.0 - 10.5 K/uL 9.4 11.9(H) 10.7(H)  Hemoglobin 13.0 - 17.0 g/dL 15.4 15.2 14.8  Hematocrit 39.0 - 52.0 % 47.9 47.5 45.0  Platelets 150 - 400 K/uL 258 269 194    CMP Latest Ref Rng & Units 02/06/2021 01/10/2021 03/23/2019  Glucose 70 - 99 mg/dL 80 99 94  BUN 8 - 23 mg/dL _0 Creatinine 0.61 - 1.24 mg/dL 0.96 1.15 0.91  Sodium 135 - 145 mmol/L 134(L) 133(L) 138  Potassium 3.5 - 5.1 mmol/L 4.3 4.8 4.7  Chloride 98 - 111 mmol/L 97(L) 93(L) 104  CO2 22 - 32 mmol/L _1 Calcium 8.9 - 10.3 mg/dL 10.0 10.2 9.5  Total Protein 6.5 - 8.1 g/dL 7.4 - -  Total Bilirubin 0.3 - 1.2 mg/dL 0.5 - -  Alkaline Phos 38 - 126 U/L 82 - -  AST 15 - 41 U/L 14(L) - -  ALT 0 - 44 U/L 7 - -    RADIOGRAPHIC STUDIES: I have personally reviewed the radiological images as listed and agreed with the findings in the report: FDG avid distal esophagus as well as subcutaneous none lymph node region in the right neck. NM PET Image Initial (PI) Skull Base To Thigh  Result Date: 02/23/2021 CLINICAL DATA:  Initial treatment strategy for metastatic adenocarcinoma of unknown primary. EXAM: NUCLEAR MEDICINE PET SKULL BASE TO THIGH TECHNIQUE: 9.8 mCi F-18 FDG was injected intravenously. Full-ring PET imaging was performed from the skull base  to thigh after the radiotracer. CT data was obtained and used for attenuation correction and anatomic localization. Fasting  blood glucose: 92 mg/dl COMPARISON:  None FINDINGS: Mediastinal blood pool activity: SUV max 2.40 Liver activity: SUV max NA NECK: There is a 6.5 mm subcutaneous lesion in the right neck just superficial to the sternocleidomastoid muscle at the level of the parotid gland. This is hypermetabolic with SUV max of 1.30. This likely represents the lesion that was biopsied. No neck mass or neck adenopathy. Incidental CT findings: Advanced atherosclerotic calcifications involving the carotid arteries. CHEST: There is a circumferential mass involving the distal esophagus which is hypermetabolic. SUV max is 13.42. Findings consistent with distal esophageal neoplasm. No paraesophageal lymphadenopathy. No mediastinal or hilar adenopathy. The esophagus is mildly dilated above the neoplasm. I do not see any definite involvement of the gastric fundus. No pulmonary nodules are identified to suggest pulmonary metastatic disease. Incidental CT findings: Mild emphysematous changes and pulmonary scarring. 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 Recommend annual imaging followup by CTA or MRA. This recommendation follows 2010 ACCF/AHA/AATS/ACR/ASA/SCA/SCAI/SIR/STS/SVM Guidelines for the Diagnosis and Management of Patients with Thoracic Aortic Disease. Circulation. 2010; 121: Q657-Q469. Aortic aneurysm NOS (ICD10-I71.9) Advanced atherosclerotic calcifications involving the coronary arteries and descending thoracic aorta. Dense calcifications along the mitral valve annulus and also around the aortic valve. ABDOMEN/PELVIS: No findings suspicious for abdominal metastatic disease. No worrisome hypermetabolic hepatic lesions. No gastrohepatic ligament or celiac axis lymphadenopathy the. No retroperitoneal adenopathy. Incidental CT findings: Advanced atherosclerotic calcifications involving the aorta and iliac arteries but no aneurysm. Mild prostate gland enlargement. SKELETON: No  significant bony findings. No evidence of osseous metastatic disease. Incidental CT findings: none IMPRESSION: 1. Hypermetabolic distal esophageal mass consistent with esophageal carcinoma. No paraesophageal, mediastinal, gastrohepatic ligament or celiac axis adenopathy. 2. No findings for hepatic or pulmonary metastatic disease. 3. Solitary hypermetabolic subcutaneous lesion in the right neck. This likely represents the lesion that was biopsied. No other neck or subcutaneous lesions are identified. Electronically Signed   By: Marijo Sanes M.D.   On: 02/23/2021 08:58    ASSESSMENT & PLAN Justin Trujillo 75 y.o. male with medical history significant for metastatic esophageal adenocarcinoma who presents for a follow up visit.   At this time Mr. Joslin findings are most consistent with a metastatic adenocarcinoma of the esophagus.  Given his symptomatic dysphagia and inability to eat solid food at this time we recommend palliative radiation to his esophagus in order to help alleviate this.  Once the patient is able to tolerate food and is steady and his weight we will consider the addition of systemic chemotherapy.  Studies are currently pending which will help [place] best target the patient's cancer.  The patient is wife voiced understanding of this plan moving forward.  We will set the patient meet with nutrition in order to help better administer calories.  #Metastatic Adenocarcinoma of the Esophagus #Dysphagia -- Recommend referral to radiation oncology for consideration of palliative radiation in order to help alleviate his dysphagia --Unfortunately given the spread of this cancer to the skin we will need to consider systemic therapy once he has completed palliative radiation --Current studies pending for MSI status, tumor mutational burden, and HER2 status --Pending the results of those studies we will present patient with systemic chemotherapy options --Return to clinic placeholder placed for 4 weeks,  can be seen sooner if radiation therapy completed before that time.  No orders of the defined  types were placed in this encounter.   All questions were answered. The patient knows to call the clinic with any problems, questions or concerns.  A total of more than 30 minutes were spent on this encounter with face-to-face time and non-face-to-face time, including preparing to see the patient, ordering tests and/or medications, counseling the patient and coordination of care as outlined above.   Ledell Peoples, MD Department of Hematology/Oncology [place] [facility] Phone: [phone] Pager: [phone] Email: [name]  03/03/2021 10:39 AM

## 2021-03-06 ENCOUNTER — Telehealth: Payer: Self-pay

## 2021-03-06 ENCOUNTER — Inpatient Hospital Stay: Payer: PPO | Attending: Radiation Oncology

## 2021-03-06 NOTE — Telephone Encounter (Signed)
Scheduled appt per 6/20 sch msg. Pt aware.  

## 2021-03-06 NOTE — Progress Notes (Signed)
Nutrition Assessment   Reason for Assessment: Patient with new diagnosis of esophageal cancer.     ASSESSMENT: 75 year old male with metastatic adenocarcinoma of distal esophagus.  Past medical history of DM, HTN, HLD, smoker. Patient to meet with radiation tomorrow.  Spoke with patient and wife via phone.  Patient reports that he is drinking ensure high protein (160 calories and 16 g protein), lipton soup, [employer] chicken noodle soup, scrambled eggs with crumbled sausage (but took over 1 hour to eat).  Reports that dysphagia to solid foods has been going on for about 1 month.  Patient feels weak.    Medications: ozempic, calcium and vit d, metformin   Labs: Na 134, glucose 86   Anthropometrics:   Height: 67 inches Weight: 194 lb 1.6 oz (6/17) UBW: 222 lb in 3/31 BMI: 30  13% weight loss in the last 2 1/2 months, significant   Estimated Energy Needs  Kcals: 2200-2600 Protein: 110-132 g Fluid: 2.2 L   NUTRITION DIAGNOSIS: Inadequate oral intake related to esophageal cancer mass as evidenced by 13% weight loss in the last 2 1/2 months and eating less than 50% estimated energy needs for > or equal to 5 days   MALNUTRITION DIAGNOSIS: Patient meets criteria of severe malnutrition as evidenced by 13% weight loss in the last 2 1/2 months and eating less than 50% estimated energy needs for > or equal to 5 days.    INTERVENTION:  Concerned with patient's overall nutritional status prior to starting treatment.   Discussed with patient option of chopping, grinding, pureeing foods for ease of swallowing. Discussed ways to add calories and protein to diet (whole milk, half and half, gravies, etc) Recommend higher calorie shake at least shake with 350 calories or more.  Sample bag of ensure complete, boost plus, reason, Dillard [place] 1.4 shake to be given to patient tomorrow prior to MD appointment. Also discussed Boost VHC (530 calories) as option.  If drinking 350 calorie shake will  need at least 6 shakes per day.  Contact information provided Recipe handout mailed, smoupies recipes mailed, soft, moist protein foods mailed. If unable to maintain weight and nutrition status during treatment will need to consider placement of feeding tube.    MONITORING, EVALUATION, GOAL: weight trends, intake   Next Visit: Monday, June 27 phone call  Nahun Kronberg B. Zenia Resides, [place], [place] Registered Dietitian [phone] (mobile)    .

## 2021-03-07 ENCOUNTER — Encounter: Payer: Self-pay | Admitting: Radiation Oncology

## 2021-03-07 ENCOUNTER — Other Ambulatory Visit: Payer: Self-pay

## 2021-03-07 ENCOUNTER — Ambulatory Visit
Admission: RE | Admit: 2021-03-07 | Discharge: 2021-03-07 | Disposition: A | Discharge status: Home / Self Care | Payer: PPO | Source: Ambulatory Visit | Attending: Radiation Oncology | Admitting: Radiation Oncology

## 2021-03-07 DIAGNOSIS — C155 Malignant neoplasm of lower third of esophagus: Secondary | ICD-10-CM | POA: Insufficient documentation

## 2021-03-07 DIAGNOSIS — C792 Secondary malignant neoplasm of skin: Secondary | ICD-10-CM | POA: Diagnosis not present

## 2021-03-07 MED ORDER — SUCRALFATE 1 G PO TABS: 1.0000 g | ORAL_TABLET | Freq: Three times a day (TID) | ORAL | 2 refills | Status: DC

## 2021-03-07 MED ORDER — OXYCODONE HCL 5 MG/5ML PO SOLN: 10.0000 mg | ORAL | 0 refills | Status: DC | PRN

## 2021-03-07 NOTE — Progress Notes (Signed)
Patient in for consult for esophageal tumor. He is having more difficulty swallowing large pills. Is using Ensure plus for supplement to diet. Having pain 4/10 at this time. Mostly to the abdominal area.

## 2021-03-08 ENCOUNTER — Ambulatory Visit
Admission: RE | Admit: 2021-03-08 | Discharge: 2021-03-08 | Disposition: A | Discharge status: Home / Self Care | Payer: PPO | Source: Ambulatory Visit | Attending: Radiation Oncology | Admitting: Radiation Oncology

## 2021-03-08 ENCOUNTER — Other Ambulatory Visit: Payer: Self-pay

## 2021-03-08 DIAGNOSIS — C155 Malignant neoplasm of lower third of esophagus: Secondary | ICD-10-CM | POA: Diagnosis not present

## 2021-03-08 DIAGNOSIS — Z510 Encounter for antineoplastic radiation therapy: Secondary | ICD-10-CM | POA: Diagnosis not present

## 2021-03-08 NOTE — Progress Notes (Signed)
Radiation Oncology         ([phone]) [phone] ________________________________  Name: Justin Trujillo        MRN: [id number]  Date of Service: 03/07/2021 DOB: 01-01-1946  NU:[id number], Hal Hope, MD  Orson Slick, MD     REFERRING PHYSICIAN: Orson Slick, MD   DIAGNOSIS: The encounter diagnosis was Malignant neoplasm of lower third of esophagus ([place]).   HISTORY OF PRESENT ILLNESS: Justin Trujillo is a 75 y.o. male seen at the request of Dr. Lorenso Courier for new diagnosis of Stage IV esophageal cancer.  The patient originally presented after progressive dysphagia and had approximately 10 pounds of weight loss prior to being evaluated given these findings he was evaluated and encouraged to proceed with further work-up, he developed a mass in the lateral anterior neck and underwent evaluation with dermatology a punch biopsy on 01/27/2021 showed adenocarcinoma. After seeing Dr. Lorenso Courier and discussing his other symptoms of dysphagia he was referred to GI and saw Dr. Watt Climes.  He underwent endoscopic evaluation on 02/24/2021, this revealed poorly differentiated carcinoma most likely a poorly differentiated adenocarcinoma.  Pet imaging on 02/22/2021 also was performed showing a distal esophageal mass that was hypermetabolic no regional adenopathy hepatic or pulmonary metastatic disease was identified but solitary hypermetabolism was seen in the right neck.  No other lesions were otherwise appreciated.  He has met back with Dr. Lorenso Courier to discuss his situation and given his extent of disease though it is low in volume, he is seen to discuss palliative radiotherapy.  He is still planning to meet back with Dr. Lorenso Courier in another week or so to discuss systemic therapy.     PREVIOUS RADIATION THERAPY: No   PAST MEDICAL HISTORY:  Past Medical History:  Diagnosis Date   Arthritis    Coronary atherosclerosis of native coronary artery    Depression    Essential tremor    High cholesterol    Hypertension    Hypertension     Hypogonadism male    Obstructive chronic bronchitis with exacerbation (HCC)    Obstructive sleep apnea (adult) (pediatric)    no cpap   Other and unspecified hyperlipidemia    Other emphysema ([place])    Proteinuria 2019   has seen a nephrologist   Shortness of breath    with excertion   Situational stress    Type II or unspecified type diabetes mellitus without mention of complication, not stated as uncontrolled    type 2   Unspecified essential hypertension        PAST SURGICAL HISTORY: Past Surgical History:  Procedure Laterality Date   CARDIAC CATHETERIZATION     IR KYPHO LUMBAR INC FX REDUCE BONE BX UNI/BIL CANNULATION INC/IMAGING  06/13/2018   L lens implant     R knee replacement x2     REVERSE SHOULDER ARTHROPLASTY Left 03/26/2019   Procedure: REVERSE SHOULDER ARTHROPLASTY;  Surgeon: Justice Britain, MD;  Location: [facility];  Service: Orthopedics;  Laterality: Left;  122mn   TONSILLECTOMY     TONSILLECTOMY AND ADENOIDECTOMY     vascectomy       FAMILY HISTORY:  Family History  Problem Relation Age of Onset   Emphysema Father    Heart disease Father    Clotting disorder Mother    Kidney cancer Child      SOCIAL HISTORY:  reports that he has been smoking cigarettes. He has a 55.00 pack-year smoking history. He has never used smokeless tobacco. He reports current alcohol  use of about 1.0 standard drink of alcohol per week. He reports that he does not use drugs.   ALLERGIES: Iodinated diagnostic agents, Moxifloxacin, Penicillins, Shellfish-derived products, Amoxicillin, Other, and Latex   MEDICATIONS:  Current Outpatient Medications  Medication Sig Dispense Refill   acetaminophen (TYLENOL) 500 MG tablet Take 650 mg by mouth every 8 (eight) hours as needed (pain).     ALPRAZolam (XANAX) 0.5 MG tablet Take 0.5 mg by mouth 2 (two) times daily.     aspirin EC 81 MG tablet Take 81 mg by mouth daily.     atorvastatin (LIPITOR) 40 MG tablet Take 1 tablet (40 mg total) by  mouth daily. 30 tablet 0   Cholecalciferol (VITAMIN D) 50 MCG (2000 UT) tablet Take 2,000 Units by mouth daily.     escitalopram (LEXAPRO) 10 MG tablet Take 10 mg by mouth daily.     gabapentin (NEURONTIN) 300 MG capsule Take 2 capsules (600 mg total) by mouth 3 (three) times daily. (Patient taking differently: Take 300 mg by mouth 3 (three) times daily.) 90 capsule 5   lisinopril-hydrochlorothiazide (ZESTORETIC) 20-12.5 MG tablet Take 1 tablet by mouth daily. 12.5 mg only     metFORMIN (GLUCOPHAGE) 1000 MG tablet Take 1 tablet (1,000 mg total) by mouth 2 (two) times daily with a meal. (Patient taking differently: Take 1,000 mg by mouth at bedtime.) 60 tablet 0   MYRBETRIQ 50 MG TB24 tablet Take 50 mg by mouth daily.     oxyCODONE (ROXICODONE) 5 MG/5ML solution Take 10 mLs (10 mg total) by mouth every 4 (four) hours as needed for severe pain. 473 mL 0   rOPINIRole (REQUIP) 0.5 MG tablet Take 0.5 mg by mouth at bedtime.     Semaglutide (OZEMPIC, 1 MG/DOSE, [place]) Inject 1 each into the skin once a week.     sucralfate (CARAFATE) 1 g tablet Take 1 tablet (1 g total) by mouth 4 (four) times daily -  with meals and at bedtime. 120 tablet 2   tamsulosin (FLOMAX) 0.4 MG CAPS capsule Take 0.4 mg by mouth daily.     testosterone cypionate (DEPOTESTOSTERONE CYPIONATE) 200 MG/ML injection Inject 200 mg into the muscle every 14 (fourteen) days. Every 2 weeks     tiZANidine (ZANAFLEX) 4 MG tablet SMARTSIG:1 Tablet(s) By Mouth 1 to 3 Times Daily PRN     trolamine salicylate (ASPERCREME) 10 % cream Apply 1 application topically 2 (two) times daily as needed for muscle pain.      Calcium Carb-Cholecalciferol (CALCIUM 600+D3 PO) Take 1 tablet by mouth daily. (Patient not taking: Reported on 03/07/2021)     calcium citrate-vitamin D (CITRACAL+D) 315-200 MG-UNIT tablet daily in the afternoon. (Patient not taking: Reported on 03/07/2021)     insulin isophane & regular human (NOVOLIN 70/30 FLEXPEN) (70-30) 100 UNIT/ML  KwikPen as needed. (Patient not taking: No sig reported)     Lidocaine 4 % PTCH as needed. (Patient not taking: No sig reported)     Multiple Vitamins-Minerals (CENTRUM SILVER) tablet daily in the afternoon. (Patient not taking: Reported on 03/07/2021)     primidone (MYSOLINE) 50 MG tablet Take 1 tablet (50 mg total) by mouth at bedtime. (Patient not taking: Reported on 03/07/2021) 90 tablet 1   No current facility-administered medications for this encounter.     REVIEW OF SYSTEMS: On review of systems, the patient reports that he is doing okay. He has lost weight unintentionally due to his dysphagia. He is able to tolerate soft and full liquid  diet. He describes pain with swallowing as well and also has chronic low back pain for which he takes oxycodone. He is having trouble swallowing his pills as well. He feels the area on his right neck is healing well since his biopsy. No other complaints are verbalized.      PHYSICAL EXAM:  Wt Readings from Last 3 Encounters:  03/07/21 195 lb 9.6 oz (88.7 kg)  03/03/21 194 lb 1.6 oz (88 kg)  02/22/21 195 lb (88.5 kg)   Temp Readings from Last 3 Encounters:  03/07/21 97.8 F (36.6 C)  03/03/21 97.8 F (36.6 C) (Tympanic)  02/06/21 98 F (36.7 C) (Tympanic)   BP Readings from Last 3 Encounters:  03/07/21 131/73  03/03/21 113/67  02/06/21 122/83   Pulse Readings from Last 3 Encounters:  03/07/21 (!) 105  03/03/21 93  02/06/21 100   Pain Assessment Pain Score: 4  Pain Frequency: Constant Pain Loc: Abdomen/10  In general this is a well appearing caucasian male in no acute distress. He's alert and oriented x4 and appropriate throughout the examination. Cardiopulmonary assessment is negative for acute distress and he exhibits normal effort. His right neck has a well healing biopsy site consistent with prior tissue sampling.    ECOG = 1  0 - Asymptomatic (Fully active, able to carry on all predisease activities without restriction)  1 -  Symptomatic but completely ambulatory (Restricted in physically strenuous activity but ambulatory and able to carry out work of a light or sedentary nature. For example, light housework, office work)  2 - Symptomatic, <50% in bed during the day (Ambulatory and capable of all self care but unable to carry out any work activities. Up and about more than 50% of waking hours)  3 - Symptomatic, >50% in bed, but not bedbound (Capable of only limited self-care, confined to bed or chair 50% or more of waking hours)  4 - Bedbound (Completely disabled. Cannot carry on any self-care. Totally confined to bed or chair)  5 - Death   Eustace Pen MM, Creech RH, Tormey DC, et al. [phone]). "Toxicity and response criteria of the [facility]". [place] Oncol. 5 (6): 649-55    LABORATORY DATA:  Lab Results  Component Value Date   WBC 9.4 03/03/2021   HGB 15.4 03/03/2021   HCT 47.9 03/03/2021   MCV 87.1 03/03/2021   PLT 258 03/03/2021   Lab Results  Component Value Date   NA 134 (L) 03/03/2021   K 4.1 03/03/2021   CL 97 (L) 03/03/2021   CO2 27 03/03/2021   Lab Results  Component Value Date   ALT 10 03/03/2021   AST 19 03/03/2021   ALKPHOS 80 03/03/2021   BILITOT 0.7 03/03/2021      RADIOGRAPHY: NM PET Image Initial (PI) Skull Base To Thigh  Result Date: 02/23/2021 CLINICAL DATA:  Initial treatment strategy for metastatic adenocarcinoma of unknown primary. EXAM: NUCLEAR MEDICINE PET SKULL BASE TO THIGH TECHNIQUE: 9.8 mCi F-18 FDG was injected intravenously. Full-ring PET imaging was performed from the skull base to thigh after the radiotracer. CT data was obtained and used for attenuation correction and anatomic localization. Fasting blood glucose: 92 mg/dl COMPARISON:  None FINDINGS: Mediastinal blood pool activity: SUV max 2.40 Liver activity: SUV max NA NECK: There is a 6.5 mm subcutaneous lesion in the right neck just superficial to the sternocleidomastoid muscle at the level  of the parotid gland. This is hypermetabolic with SUV max of 3.54. This likely represents  the lesion that was biopsied. No neck mass or neck adenopathy. Incidental CT findings: Advanced atherosclerotic calcifications involving the carotid arteries. CHEST: There is a circumferential mass involving the distal esophagus which is hypermetabolic. SUV max is 13.42. Findings consistent with distal esophageal neoplasm. No paraesophageal lymphadenopathy. No mediastinal or hilar adenopathy. The esophagus is mildly dilated above the neoplasm. I do not see any definite involvement of the gastric fundus. No pulmonary nodules are identified to suggest pulmonary metastatic disease. Incidental CT findings: Mild emphysematous changes and pulmonary scarring. 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 Recommend annual imaging followup by CTA or MRA. This recommendation follows 2010 ACCF/AHA/AATS/ACR/ASA/SCA/SCAI/SIR/STS/SVM Guidelines for the Diagnosis and Management of Patients with Thoracic Aortic Disease. Circulation. 2010; 121: L410-V013. Aortic aneurysm NOS (ICD10-I71.9) Advanced atherosclerotic calcifications involving the coronary arteries and descending thoracic aorta. Dense calcifications along the mitral valve annulus and also around the aortic valve. ABDOMEN/PELVIS: No findings suspicious for abdominal metastatic disease. No worrisome hypermetabolic hepatic lesions. No gastrohepatic ligament or celiac axis lymphadenopathy the. No retroperitoneal adenopathy. Incidental CT findings: Advanced atherosclerotic calcifications involving the aorta and iliac arteries but no aneurysm. Mild prostate gland enlargement. SKELETON: No significant bony findings. No evidence of osseous metastatic disease. Incidental CT findings: none IMPRESSION: 1. Hypermetabolic distal esophageal mass consistent with esophageal carcinoma. No paraesophageal, mediastinal, gastrohepatic ligament or  celiac axis adenopathy. 2. No findings for hepatic or pulmonary metastatic disease. 3. Solitary hypermetabolic subcutaneous lesion in the right neck. This likely represents the lesion that was biopsied. No other neck or subcutaneous lesions are identified. Electronically Signed   By: Marijo Sanes M.D.   On: 02/23/2021 08:58       IMPRESSION/PLAN: 1. Stage IV esophageal adenocarcinoma involving the distal esophagus and right neck. Dr. Lisbeth Renshaw discusses the pathology findings and reviews the nature of metastatic esophageal disease. He offers the patient palliative radiotherapy to the esophagus and right neck. He will also proceed with systemic therapy along with Dr. Lorenso Courier.  We discussed the risks, benefits, short, and long term effects of radiotherapy, as well as the palliative intent, and the patient is interested in proceeding. Dr. Lisbeth Renshaw discusses the delivery and logistics of radiotherapy and anticipates a course of 3 weeks of radiotherapy to the esophagus and right neck. He will soon begin systemic treatment and this will should not interfere with our ability to give radiotherapy at the same time. Written consent is obtained and placed in the chart, a copy was provided to the patient. He will simulate tomorrow.  2. Pain from #1 and low back pain. Given his dysphagia and odynophagia we will switch his oxycodone to suspension and I've also sent in carafate to try to help his symptoms at the time of eating.   In a visit lasting 60 minutes, greater than 50% of the time was spent face to face discussing the patient's condition, in preparation for the discussion, and coordinating the patient's care.   The above documentation reflects my direct findings during this shared patient visit. Please see the separate note by Dr. Lisbeth Renshaw on this date for the remainder of the patient's plan of care.    Carola Rhine, [facility]   **Disclaimer: This note was dictated with voice recognition software. Similar sounding  words can inadvertently be transcribed and this note may contain transcription errors which may not have been corrected upon publication of note.**

## 2021-03-09 DIAGNOSIS — C159 Malignant neoplasm of esophagus, unspecified: Secondary | ICD-10-CM | POA: Diagnosis not present

## 2021-03-11 DIAGNOSIS — C155 Malignant neoplasm of lower third of esophagus: Secondary | ICD-10-CM | POA: Diagnosis not present

## 2021-03-11 DIAGNOSIS — Z510 Encounter for antineoplastic radiation therapy: Secondary | ICD-10-CM | POA: Diagnosis not present

## 2021-03-13 ENCOUNTER — Inpatient Hospital Stay: Payer: PPO

## 2021-03-13 NOTE — Progress Notes (Signed)
Nutrition Follow-up:    Patient with new diagnosis of esophageal cancer.  Planning to start radiation on 6/30.    Spoke with patient today for nutrition follow-up.  Patient reports that yesterday was a good day met with cousin in [place]. Ate mashed potatoes with gravy, green beans, french onion soup, oatmeal and drank 1-2 ensure shakes.  Reports today has not been a good day.  Has had some mashed potatoes with gravy, egg salad, and 2 ensure plus shakes. Says that tumor is irritated today, has taken mylanta times 2.  Wife has ordered Boost VHC (530 calories, 22 g protein) shakes for patient.  Tried the shakes that RD left for him. Did not like the reason shake and could not remember other one he did not like.  Able to tolerate the boost/ensure shakes.  Patient concerned about blood glucose with drinking higher sugar drinks.     Medications: reviewed  Labs: reviewed  Anthropometrics:   Weight 195 lb 9.6 oz on 6/21  194 lb 1.6 oz on 6/17 222 lb in 3/31   NUTRITION DIAGNOSIS: Inadequate oral intake continues   INTERVENTION:  Recommend higher calorie shakes (350 +) at this time vs lower sugar shakes as lower in calories). Patient so limited with oral intake from mass. Encouraged patient to continue to check blood glucose and touch base with PCP regarding adjustments in medication to better control blood glucose.   Discussed drinking shakes q 3-4 hours Complimentary case of ensure will be left at Support Services for patient to pick up on 6/30   MONITORING, EVALUATION, GOAL: weight trends, intake   NEXT VISIT: Wednesday, July 6 prior to radiation at 3:15 pm per patient with Fritz Pickerel B. Zenia Resides, [place], [place] Registered Dietitian [phone] (mobile)

## 2021-03-14 DIAGNOSIS — C159 Malignant neoplasm of esophagus, unspecified: Secondary | ICD-10-CM | POA: Diagnosis not present

## 2021-03-16 ENCOUNTER — Ambulatory Visit: Payer: PPO | Admitting: Radiation Oncology

## 2021-03-16 ENCOUNTER — Other Ambulatory Visit: Payer: Self-pay

## 2021-03-16 ENCOUNTER — Ambulatory Visit
Admission: RE | Admit: 2021-03-16 | Discharge: 2021-03-16 | Disposition: A | Discharge status: Home / Self Care | Payer: PPO | Source: Ambulatory Visit | Attending: Radiation Oncology | Admitting: Radiation Oncology

## 2021-03-16 DIAGNOSIS — Z510 Encounter for antineoplastic radiation therapy: Secondary | ICD-10-CM | POA: Diagnosis not present

## 2021-03-16 DIAGNOSIS — C155 Malignant neoplasm of lower third of esophagus: Secondary | ICD-10-CM | POA: Diagnosis not present

## 2021-03-17 ENCOUNTER — Ambulatory Visit
Admission: RE | Admit: 2021-03-17 | Discharge: 2021-03-17 | Disposition: A | Discharge status: Home / Self Care | Payer: PPO | Source: Ambulatory Visit | Attending: Radiation Oncology | Admitting: Radiation Oncology

## 2021-03-17 DIAGNOSIS — C155 Malignant neoplasm of lower third of esophagus: Secondary | ICD-10-CM | POA: Insufficient documentation

## 2021-03-17 DIAGNOSIS — Z510 Encounter for antineoplastic radiation therapy: Secondary | ICD-10-CM | POA: Diagnosis not present

## 2021-03-21 ENCOUNTER — Ambulatory Visit
Admission: RE | Admit: 2021-03-21 | Discharge: 2021-03-21 | Disposition: A | Discharge status: Home / Self Care | Payer: PPO | Source: Ambulatory Visit | Attending: Radiation Oncology | Admitting: Radiation Oncology

## 2021-03-21 ENCOUNTER — Other Ambulatory Visit: Payer: Self-pay

## 2021-03-21 ENCOUNTER — Ambulatory Visit: Payer: PPO | Admitting: Radiation Oncology

## 2021-03-21 DIAGNOSIS — C155 Malignant neoplasm of lower third of esophagus: Secondary | ICD-10-CM | POA: Diagnosis not present

## 2021-03-21 DIAGNOSIS — Z510 Encounter for antineoplastic radiation therapy: Secondary | ICD-10-CM | POA: Diagnosis not present

## 2021-03-22 ENCOUNTER — Ambulatory Visit
Admission: RE | Admit: 2021-03-22 | Discharge: 2021-03-22 | Disposition: A | Discharge status: Home / Self Care | Payer: PPO | Source: Ambulatory Visit | Attending: Radiation Oncology | Admitting: Radiation Oncology

## 2021-03-22 ENCOUNTER — Inpatient Hospital Stay: Payer: PPO | Attending: Hematology and Oncology | Admitting: Dietician

## 2021-03-22 ENCOUNTER — Ambulatory Visit: Payer: PPO | Admitting: Radiation Oncology

## 2021-03-22 DIAGNOSIS — F32A Depression, unspecified: Secondary | ICD-10-CM | POA: Insufficient documentation

## 2021-03-22 DIAGNOSIS — C792 Secondary malignant neoplasm of skin: Secondary | ICD-10-CM | POA: Insufficient documentation

## 2021-03-22 DIAGNOSIS — Z79899 Other long term (current) drug therapy: Secondary | ICD-10-CM | POA: Insufficient documentation

## 2021-03-22 DIAGNOSIS — F17210 Nicotine dependence, cigarettes, uncomplicated: Secondary | ICD-10-CM | POA: Insufficient documentation

## 2021-03-22 DIAGNOSIS — E119 Type 2 diabetes mellitus without complications: Secondary | ICD-10-CM | POA: Insufficient documentation

## 2021-03-22 DIAGNOSIS — C155 Malignant neoplasm of lower third of esophagus: Secondary | ICD-10-CM | POA: Diagnosis not present

## 2021-03-22 DIAGNOSIS — Z510 Encounter for antineoplastic radiation therapy: Secondary | ICD-10-CM | POA: Diagnosis not present

## 2021-03-22 DIAGNOSIS — R1310 Dysphagia, unspecified: Secondary | ICD-10-CM | POA: Insufficient documentation

## 2021-03-22 DIAGNOSIS — R634 Abnormal weight loss: Secondary | ICD-10-CM | POA: Insufficient documentation

## 2021-03-22 DIAGNOSIS — J449 Chronic obstructive pulmonary disease, unspecified: Secondary | ICD-10-CM | POA: Insufficient documentation

## 2021-03-22 DIAGNOSIS — Z7984 Long term (current) use of oral hypoglycemic drugs: Secondary | ICD-10-CM | POA: Insufficient documentation

## 2021-03-22 DIAGNOSIS — Z7982 Long term (current) use of aspirin: Secondary | ICD-10-CM | POA: Insufficient documentation

## 2021-03-22 NOTE — Progress Notes (Signed)
Nutrition Follow-up:  Patient with esophageal cancer. He is receiving radiation therapy.   Met with patient in clinic today. He reports decreased intake, esophagus feels scratchy, states carafate is not helping. Patient is finding small bite foods more difficult to eat. Patient reports he is able to swallow finely chopped meats without difficulty, but does not know that food "is hung up" until it is too late and he is coughing it all back up. Patient reports that is scary to him so he is consuming smooth foods (cream soups, oatmeal, mashed potatoes with gravy) He would really like to have a nice cut of prime rib. Patient is consistently drinking 3 Boost VHC (530 kcal, 22 g protein), sometimes he has drank 4. He drinks a lot of water and coffee. He ate chicken pureed in blender with gravy for dinner, had egg salad with VHC for breakfast this morning and drank another VHC before leaving house this afternoon. Patient reports blood sugars 160-170 after drinking oral supplement, says they go back down fairly quickly.     Medications: Carafate, Oxycodone, Gabapentin, Metformin, Xananx  Labs: 6/17 labs reviewed  Anthropometrics: Patient weighed 190 lb in office today, says this is increased from 188 lb on home scale.   6/21 - 195 lb 9.6 oz 6/17 - 194 lb 1.6 oz 3/31 - 222 lb   NUTRITION DIAGNOSIS: Inadequate oral intake continues   INTERVENTION: Continue drinking 3 Boost VHC daily Recommended drinking 2-3 Ensure Plus Patient picked up complimentary Ensure case today Discussed adding supplement to ice cream for high calorie, high protein shake Discussed strategies for adding calories and protein to foods (cooking creamy soups with milk, adding cheese, using butter) Encouraged bedtime snack (bowl of oatmeal made with milk) Contact information provided    MONITORING, EVALUATION, GOAL: weight trends, intake   NEXT VISIT: Wednesday, July 13

## 2021-03-23 ENCOUNTER — Ambulatory Visit
Admission: RE | Admit: 2021-03-23 | Discharge: 2021-03-23 | Disposition: A | Discharge status: Home / Self Care | Payer: PPO | Source: Ambulatory Visit | Attending: Radiation Oncology | Admitting: Radiation Oncology

## 2021-03-23 ENCOUNTER — Other Ambulatory Visit: Payer: Self-pay

## 2021-03-23 DIAGNOSIS — C155 Malignant neoplasm of lower third of esophagus: Secondary | ICD-10-CM | POA: Diagnosis not present

## 2021-03-23 DIAGNOSIS — Z510 Encounter for antineoplastic radiation therapy: Secondary | ICD-10-CM | POA: Diagnosis not present

## 2021-03-24 ENCOUNTER — Ambulatory Visit
Admission: RE | Admit: 2021-03-24 | Discharge: 2021-03-24 | Disposition: A | Discharge status: Home / Self Care | Payer: PPO | Source: Ambulatory Visit | Attending: Radiation Oncology | Admitting: Radiation Oncology

## 2021-03-24 DIAGNOSIS — Z510 Encounter for antineoplastic radiation therapy: Secondary | ICD-10-CM | POA: Diagnosis not present

## 2021-03-24 DIAGNOSIS — C155 Malignant neoplasm of lower third of esophagus: Secondary | ICD-10-CM | POA: Diagnosis not present

## 2021-03-27 ENCOUNTER — Other Ambulatory Visit: Payer: Self-pay

## 2021-03-27 ENCOUNTER — Ambulatory Visit
Admission: RE | Admit: 2021-03-27 | Discharge: 2021-03-27 | Disposition: A | Discharge status: Home / Self Care | Payer: PPO | Source: Ambulatory Visit | Attending: Radiation Oncology | Admitting: Radiation Oncology

## 2021-03-27 DIAGNOSIS — C155 Malignant neoplasm of lower third of esophagus: Secondary | ICD-10-CM | POA: Diagnosis not present

## 2021-03-27 DIAGNOSIS — Z510 Encounter for antineoplastic radiation therapy: Secondary | ICD-10-CM | POA: Diagnosis not present

## 2021-03-28 ENCOUNTER — Ambulatory Visit
Admission: RE | Admit: 2021-03-28 | Discharge: 2021-03-28 | Disposition: A | Discharge status: Home / Self Care | Payer: PPO | Source: Ambulatory Visit | Attending: Radiation Oncology | Admitting: Radiation Oncology

## 2021-03-28 DIAGNOSIS — Z510 Encounter for antineoplastic radiation therapy: Secondary | ICD-10-CM | POA: Diagnosis not present

## 2021-03-28 DIAGNOSIS — C155 Malignant neoplasm of lower third of esophagus: Secondary | ICD-10-CM | POA: Diagnosis not present

## 2021-03-29 ENCOUNTER — Ambulatory Visit: Payer: PPO

## 2021-03-29 ENCOUNTER — Ambulatory Visit: Payer: PPO | Admitting: Dietician

## 2021-03-29 NOTE — Progress Notes (Signed)
Nutrition  Patient did not show for scheduled nutrition appointment today.  

## 2021-03-30 ENCOUNTER — Inpatient Hospital Stay: Payer: PPO

## 2021-03-30 ENCOUNTER — Other Ambulatory Visit: Payer: Self-pay | Admitting: Hematology and Oncology

## 2021-03-30 ENCOUNTER — Inpatient Hospital Stay: Payer: PPO | Admitting: Hematology and Oncology

## 2021-03-30 ENCOUNTER — Encounter: Payer: Self-pay | Admitting: Hematology and Oncology

## 2021-03-30 ENCOUNTER — Ambulatory Visit
Admission: RE | Admit: 2021-03-30 | Discharge: 2021-03-30 | Disposition: A | Discharge status: Home / Self Care | Payer: PPO | Source: Ambulatory Visit | Attending: Radiation Oncology | Admitting: Radiation Oncology

## 2021-03-30 ENCOUNTER — Other Ambulatory Visit: Payer: Self-pay

## 2021-03-30 VITALS — BP 92/65 | HR 98 | Temp 98.3°F | Resp 15 | Ht 67.0 in | Wt 189.8 lb

## 2021-03-30 DIAGNOSIS — Z7982 Long term (current) use of aspirin: Secondary | ICD-10-CM | POA: Diagnosis not present

## 2021-03-30 DIAGNOSIS — Z79899 Other long term (current) drug therapy: Secondary | ICD-10-CM | POA: Diagnosis not present

## 2021-03-30 DIAGNOSIS — Z7984 Long term (current) use of oral hypoglycemic drugs: Secondary | ICD-10-CM | POA: Diagnosis not present

## 2021-03-30 DIAGNOSIS — F32A Depression, unspecified: Secondary | ICD-10-CM | POA: Diagnosis not present

## 2021-03-30 DIAGNOSIS — C792 Secondary malignant neoplasm of skin: Secondary | ICD-10-CM | POA: Diagnosis not present

## 2021-03-30 DIAGNOSIS — R634 Abnormal weight loss: Secondary | ICD-10-CM | POA: Diagnosis not present

## 2021-03-30 DIAGNOSIS — Z510 Encounter for antineoplastic radiation therapy: Secondary | ICD-10-CM | POA: Diagnosis not present

## 2021-03-30 DIAGNOSIS — C159 Malignant neoplasm of esophagus, unspecified: Secondary | ICD-10-CM

## 2021-03-30 DIAGNOSIS — F17210 Nicotine dependence, cigarettes, uncomplicated: Secondary | ICD-10-CM | POA: Diagnosis not present

## 2021-03-30 DIAGNOSIS — C155 Malignant neoplasm of lower third of esophagus: Secondary | ICD-10-CM | POA: Diagnosis not present

## 2021-03-30 DIAGNOSIS — E119 Type 2 diabetes mellitus without complications: Secondary | ICD-10-CM | POA: Diagnosis not present

## 2021-03-30 DIAGNOSIS — Z7189 Other specified counseling: Secondary | ICD-10-CM | POA: Diagnosis not present

## 2021-03-30 DIAGNOSIS — J449 Chronic obstructive pulmonary disease, unspecified: Secondary | ICD-10-CM | POA: Diagnosis not present

## 2021-03-30 DIAGNOSIS — R1310 Dysphagia, unspecified: Secondary | ICD-10-CM | POA: Diagnosis not present

## 2021-03-30 LAB — CBC WITH DIFFERENTIAL (CANCER CENTER ONLY)
Abs Immature Granulocytes: 0.04 10*3/uL (ref 0.00–0.07)
Basophils Absolute: 0 10*3/uL (ref 0.0–0.1)
Basophils Relative: 0 %
Eosinophils Absolute: 0.1 10*3/uL (ref 0.0–0.5)
Eosinophils Relative: 1 %
HCT: 43.9 % (ref 39.0–52.0)
Hemoglobin: 14.4 g/dL (ref 13.0–17.0)
Immature Granulocytes: 1 %
Lymphocytes Relative: 6 %
Lymphs Abs: 0.5 10*3/uL — ABNORMAL LOW (ref 0.7–4.0)
MCH: 28.2 pg (ref 26.0–34.0)
MCHC: 32.8 g/dL (ref 30.0–36.0)
MCV: 85.9 fL (ref 80.0–100.0)
Monocytes Absolute: 0.5 10*3/uL (ref 0.1–1.0)
Monocytes Relative: 6 %
Neutro Abs: 7.2 10*3/uL (ref 1.7–7.7)
Neutrophils Relative %: 86 %
Platelet Count: 194 10*3/uL (ref 150–400)
RBC: 5.11 MIL/uL (ref 4.22–5.81)
RDW: 16.4 % — ABNORMAL HIGH (ref 11.5–15.5)
WBC Count: 8.4 10*3/uL (ref 4.0–10.5)
nRBC: 0 % (ref 0.0–0.2)

## 2021-03-30 LAB — CMP (CANCER CENTER ONLY)
ALT: 7 U/L (ref 0–44)
AST: 13 U/L — ABNORMAL LOW (ref 15–41)
Albumin: 3.4 g/dL — ABNORMAL LOW (ref 3.5–5.0)
Alkaline Phosphatase: 85 U/L (ref 38–126)
Anion gap: 11 (ref 5–15)
BUN: 21 mg/dL (ref 8–23)
CO2: 26 mmol/L (ref 22–32)
Calcium: 9.7 mg/dL (ref 8.9–10.3)
Chloride: 99 mmol/L (ref 98–111)
Creatinine: 0.86 mg/dL (ref 0.61–1.24)
GFR, Estimated: >60 mL/min (ref >60)
Glucose, Bld: 197 mg/dL — ABNORMAL HIGH (ref 70–99)
Potassium: 3.8 mmol/L (ref 3.5–5.1)
Sodium: 136 mmol/L (ref 135–145)
Total Bilirubin: 0.4 mg/dL (ref 0.3–1.2)
Total Protein: 6.8 g/dL (ref 6.5–8.1)

## 2021-03-30 NOTE — Progress Notes (Signed)
Orders only

## 2021-03-31 ENCOUNTER — Ambulatory Visit
Admission: RE | Admit: 2021-03-31 | Discharge: 2021-03-31 | Disposition: A | Discharge status: Home / Self Care | Payer: PPO | Source: Ambulatory Visit | Attending: Radiation Oncology | Admitting: Radiation Oncology

## 2021-03-31 DIAGNOSIS — Z510 Encounter for antineoplastic radiation therapy: Secondary | ICD-10-CM | POA: Diagnosis not present

## 2021-03-31 DIAGNOSIS — C155 Malignant neoplasm of lower third of esophagus: Secondary | ICD-10-CM | POA: Diagnosis not present

## 2021-04-03 ENCOUNTER — Ambulatory Visit
Admission: RE | Admit: 2021-04-03 | Discharge: 2021-04-03 | Disposition: A | Discharge status: Home / Self Care | Payer: PPO | Source: Ambulatory Visit | Attending: Radiation Oncology | Admitting: Radiation Oncology

## 2021-04-03 ENCOUNTER — Other Ambulatory Visit: Payer: Self-pay

## 2021-04-03 DIAGNOSIS — Z510 Encounter for antineoplastic radiation therapy: Secondary | ICD-10-CM | POA: Diagnosis not present

## 2021-04-03 DIAGNOSIS — C155 Malignant neoplasm of lower third of esophagus: Secondary | ICD-10-CM | POA: Diagnosis not present

## 2021-04-04 ENCOUNTER — Ambulatory Visit: Payer: PPO

## 2021-04-04 ENCOUNTER — Ambulatory Visit
Admission: RE | Admit: 2021-04-04 | Discharge: 2021-04-04 | Disposition: A | Discharge status: Home / Self Care | Payer: PPO | Source: Ambulatory Visit | Attending: Radiation Oncology | Admitting: Radiation Oncology

## 2021-04-04 DIAGNOSIS — Z510 Encounter for antineoplastic radiation therapy: Secondary | ICD-10-CM | POA: Diagnosis not present

## 2021-04-04 DIAGNOSIS — M5416 Radiculopathy, lumbar region: Secondary | ICD-10-CM | POA: Diagnosis not present

## 2021-04-04 DIAGNOSIS — M62838 Other muscle spasm: Secondary | ICD-10-CM | POA: Diagnosis not present

## 2021-04-04 DIAGNOSIS — M4150 Other secondary scoliosis, site unspecified: Secondary | ICD-10-CM | POA: Diagnosis not present

## 2021-04-04 DIAGNOSIS — M5136 Other intervertebral disc degeneration, lumbar region: Secondary | ICD-10-CM | POA: Diagnosis not present

## 2021-04-04 DIAGNOSIS — C155 Malignant neoplasm of lower third of esophagus: Secondary | ICD-10-CM | POA: Diagnosis not present

## 2021-04-05 ENCOUNTER — Ambulatory Visit
Admission: RE | Admit: 2021-04-05 | Discharge: 2021-04-05 | Disposition: A | Discharge status: Home / Self Care | Payer: PPO | Source: Ambulatory Visit | Attending: Radiation Oncology | Admitting: Radiation Oncology

## 2021-04-05 ENCOUNTER — Other Ambulatory Visit: Payer: Self-pay

## 2021-04-05 ENCOUNTER — Encounter: Payer: Self-pay | Admitting: Radiation Oncology

## 2021-04-05 DIAGNOSIS — C155 Malignant neoplasm of lower third of esophagus: Secondary | ICD-10-CM | POA: Diagnosis not present

## 2021-04-05 DIAGNOSIS — Z510 Encounter for antineoplastic radiation therapy: Secondary | ICD-10-CM | POA: Diagnosis not present

## 2021-04-07 ENCOUNTER — Inpatient Hospital Stay: Payer: PPO | Admitting: Dietician

## 2021-04-07 NOTE — Progress Notes (Signed)
Nutrition  Patient scheduled for telephone follow-up today. Patient did not answer. Message left with request for return call. Contact information provided

## 2021-04-07 NOTE — Progress Notes (Signed)
Opened for chart review

## 2021-04-09 ENCOUNTER — Encounter: Payer: Self-pay | Admitting: Hematology and Oncology

## 2021-04-09 NOTE — Progress Notes (Signed)
START ON PATHWAY REGIMEN - Gastroesophageal     A cycle is every 14 days:     Nivolumab      Oxaliplatin      Leucovorin      Fluorouracil      Fluorouracil   **Always confirm dose/schedule in your pharmacy ordering system**  Patient Characteristics: Distant Metastases (cM1/pM1) / Locally Recurrent Disease, Adenocarcinoma - Esophageal, [place]unction, and Gastric, First Line, HER2 Negative/Unknown, PD?L1 Expression  PositiveCPS ? 5 Histology: Adenocarcinoma Disease Classification: Esophageal Therapeutic Status: Distant Metastases (No Additional Staging) Line of Therapy: First Line HER2 Status: Negative PD-L1 Expression Status: PD-L1 Expression Positive CPS ? 5 Intent of Therapy: Non-Curative / Palliative Intent, Discussed with Patient

## 2021-04-09 NOTE — Progress Notes (Signed)
[place] [phone]   [phone]  PROGRESS NOTE  Patient Care Team: Carol Ada, MD as PCP - General (Family Medicine) Evans Lance, MD as PCP - Cardiology (Cardiology) Orson Slick, MD as Consulting Physician (Hematology and Oncology)  Hematological/Oncological History # Metastatic Adenocarcinoma of the Distal Esophagus 01/27/2021: dermatology performed a punch biopsy of the right superior lateral anterior neck. Finding show adenocarcinoma negative for PSA and cytokeratin 20.  02/06/2021: establish care with Dr. Lorenso Courier  02/15/2021: EGD found esophageal mass. Biopsy confirms poorly differentiated carcinoma 02/22/2021: PET CT scan shows FDG avid mass in distal esophagus and subcutaneous tissue or right neck.  03/22/2021-04/05/2021: palliative radiation to the esophageal mass.   Interval History:  Justin Trujillo 75 y.o. male with medical history significant for metastatic esophageal adenocarcinoma who presents for a follow up visit. The patient's last visit was on 03/03/2021. In the interim since the last visit he has started palliative radiation to the mass.  On exam today Justin Trujillo is accompanied by his wife.  Unfortunately his weight continues to drop and he continues to have difficulty tolerating solid foods.  His weight is currently down to 189 pounds down from 194 pounds during his last visit in June.  He reports that he continues to eat a liquid and soft diet.  He has been doing his best to eat boost to try to help with this.  He notes that he has been sleeping a lot more throughout the day.  He otherwise has not been having any issues with fevers, chills, sweats, nausea, vomiting or diarrhea.  A full 10 point ROS is listed below.  MEDICAL HISTORY:  Past Medical History:  Diagnosis Date   Arthritis    Coronary atherosclerosis of native coronary artery    Depression    Essential tremor    High cholesterol    Hypertension    Hypertension     Hypogonadism male    Obstructive chronic bronchitis with exacerbation (HCC)    Obstructive sleep apnea (adult) (pediatric)    no cpap   Other and unspecified hyperlipidemia    Other emphysema ([place])    Proteinuria 2019   has seen a nephrologist   Shortness of breath    with excertion   Situational stress    Type II or unspecified type diabetes mellitus without mention of complication, not stated as uncontrolled    type 2   Unspecified essential hypertension     SURGICAL HISTORY: Past Surgical History:  Procedure Laterality Date   CARDIAC CATHETERIZATION     IR KYPHO LUMBAR INC FX REDUCE BONE BX UNI/BIL CANNULATION INC/IMAGING  06/13/2018   L lens implant     R knee replacement x2     REVERSE SHOULDER ARTHROPLASTY Left 03/26/2019   Procedure: REVERSE SHOULDER ARTHROPLASTY;  Surgeon: Justice Britain, MD;  Location: [facility];  Service: Orthopedics;  Laterality: Left;  180mn   TONSILLECTOMY     TONSILLECTOMY AND ADENOIDECTOMY     vascectomy      SOCIAL HISTORY: Social History   Socioeconomic History   Marital status: Married    Spouse name: Not on file   Number of children: Not on file   Years of education: Not on file   Highest education level: Not on file  Occupational History   Occupation: lLandscape architect Tobacco Use   Smoking status: Every Day    Packs/day: 1.00    Years: 55.00    Pack years: 55.00  Types: Cigarettes   Smokeless tobacco: Never  Vaping Use   Vaping Use: Never used  Substance and Sexual Activity   Alcohol use: Yes    Alcohol/week: 1.0 standard drink    Types: 1 Cans of beer per week    Comment: occasional beer   Drug use: Never   Sexual activity: Not Currently  Other Topics Concern   Not on file  Social History Narrative   Not on file   Social Determinants of Health   Financial Resource Strain: Not on file  Food Insecurity: Not on file  Transportation Needs: Not on file  Physical Activity: Not on file  Stress: Not on  file  Social Connections: Not on file  Intimate Partner Violence: Not on file    FAMILY HISTORY: Family History  Problem Relation Age of Onset   Emphysema Father    Heart disease Father    Clotting disorder Mother    Kidney cancer Child     ALLERGIES:  is allergic to iodinated diagnostic agents, moxifloxacin, penicillins, shellfish-derived products, amoxicillin, other, and latex.  MEDICATIONS:  Current Outpatient Medications  Medication Sig Dispense Refill   acetaminophen (TYLENOL) 500 MG tablet Take 650 mg by mouth every 8 (eight) hours as needed (pain).     ALPRAZolam (XANAX) 0.5 MG tablet Take 0.5 mg by mouth 2 (two) times daily.     aspirin EC 81 MG tablet Take 81 mg by mouth daily.     atorvastatin (LIPITOR) 40 MG tablet Take 1 tablet (40 mg total) by mouth daily. 30 tablet 0   Calcium Carb-Cholecalciferol (CALCIUM 600+D3 PO) Take 1 tablet by mouth daily. (Patient not taking: Reported on 03/07/2021)     calcium citrate-vitamin D (CITRACAL+D) 315-200 MG-UNIT tablet daily in the afternoon. (Patient not taking: Reported on 03/07/2021)     Cholecalciferol (VITAMIN D) 50 MCG (2000 UT) tablet Take 2,000 Units by mouth daily.     escitalopram (LEXAPRO) 10 MG tablet Take 10 mg by mouth daily.     gabapentin (NEURONTIN) 300 MG capsule Take 2 capsules (600 mg total) by mouth 3 (three) times daily. (Patient taking differently: Take 300 mg by mouth 3 (three) times daily.) 90 capsule 5   insulin isophane & regular human (NOVOLIN 70/30 FLEXPEN) (70-30) 100 UNIT/ML KwikPen as needed. (Patient not taking: No sig reported)     Lidocaine 4 % PTCH as needed. (Patient not taking: No sig reported)     lisinopril-hydrochlorothiazide (ZESTORETIC) 20-12.5 MG tablet Take 1 tablet by mouth daily. 12.5 mg only     metFORMIN (GLUCOPHAGE) 1000 MG tablet Take 1 tablet (1,000 mg total) by mouth 2 (two) times daily with a meal. (Patient taking differently: Take 1,000 mg by mouth at bedtime.) 60 tablet 0    Multiple Vitamins-Minerals (CENTRUM SILVER) tablet daily in the afternoon. (Patient not taking: Reported on 03/07/2021)     MYRBETRIQ 50 MG TB24 tablet Take 50 mg by mouth daily.     oxyCODONE (ROXICODONE) 5 MG/5ML solution Take 10 mLs (10 mg total) by mouth every 4 (four) hours as needed for severe pain. 473 mL 0   primidone (MYSOLINE) 50 MG tablet Take 1 tablet (50 mg total) by mouth at bedtime. (Patient not taking: Reported on 03/07/2021) 90 tablet 1   rOPINIRole (REQUIP) 0.5 MG tablet Take 0.5 mg by mouth at bedtime.     Semaglutide (OZEMPIC, 1 MG/DOSE, [place]) Inject 1 each into the skin once a week.     sucralfate (CARAFATE) 1 g tablet Take  1 tablet (1 g total) by mouth 4 (four) times daily -  with meals and at bedtime. 120 tablet 2   tamsulosin (FLOMAX) 0.4 MG CAPS capsule Take 0.4 mg by mouth daily.     testosterone cypionate (DEPOTESTOSTERONE CYPIONATE) 200 MG/ML injection Inject 200 mg into the muscle every 14 (fourteen) days. Every 2 weeks     tiZANidine (ZANAFLEX) 4 MG tablet SMARTSIG:1 Tablet(s) By Mouth 1 to 3 Times Daily PRN     trolamine salicylate (ASPERCREME) 10 % cream Apply 1 application topically 2 (two) times daily as needed for muscle pain.      No current facility-administered medications for this visit.    REVIEW OF SYSTEMS:   Constitutional: ( - ) fevers, ( - )  chills , ( - ) night sweats Eyes: ( - ) blurriness of vision, ( - ) double vision, ( - ) watery eyes Ears, nose, mouth, throat, and face: ( - ) mucositis, ( - ) sore throat Respiratory: ( - ) cough, ( - ) dyspnea, ( - ) wheezes Cardiovascular: ( - ) palpitation, ( - ) chest discomfort, ( - ) lower extremity swelling Gastrointestinal:  ( - ) nausea, ( - ) heartburn, ( - ) change in bowel habits Skin: ( - ) abnormal skin rashes Lymphatics: ( - ) new lymphadenopathy, ( - ) easy bruising Neurological: ( - ) numbness, ( - ) tingling, ( - ) new weaknesses Behavioral/Psych: ( - ) mood change, ( - ) new changes  All  other systems were reviewed with the patient and are negative.  PHYSICAL EXAMINATION: ECOG PERFORMANCE STATUS: 1 - Symptomatic but completely ambulatory  Vitals:   03/30/21 1501  BP: 92/65  Pulse: 98  Resp: 15  Temp: 98.3 F (36.8 C)  SpO2: 97%   Filed Weights   03/30/21 1501  Weight: 189 lb 12.8 oz (86.1 kg)    GENERAL: alert, no distress and comfortable SKIN: skin color, texture, turgor are normal, no rashes or significant lesions EYES: conjunctiva are pink and non-injected, sclera clear OROPHARYNX: no exudate, no erythema; lips, buccal mucosa, and tongue normal  NECK: supple, non-tender LYMPH:  no palpable lymphadenopathy in the cervical, axillary or inguinal LUNGS: clear to auscultation and percussion with normal breathing effort HEART: regular rate & rhythm and no murmurs and no lower extremity edema ABDOMEN: soft, non-tender, non-distended, normal bowel sounds Musculoskeletal: no cyanosis of digits and no clubbing  PSYCH: alert & oriented x 3, fluent speech NEURO: no focal motor/sensory deficits  LABORATORY DATA:  I have reviewed the data as listed CBC Latest Ref Rng & Units 03/30/2021 03/03/2021 02/06/2021  WBC 4.0 - 10.5 K/uL 8.4 9.4 11.9(H)  Hemoglobin 13.0 - 17.0 g/dL 14.4 15.4 15.2  Hematocrit 39.0 - 52.0 % 43.9 47.9 47.5  Platelets 150 - 400 K/uL 194 258 269    CMP Latest Ref Rng & Units 03/30/2021 03/03/2021 02/06/2021  Glucose 70 - 99 mg/dL 197(H) 86 80  BUN 8 - 23 mg/dL _0 Creatinine 0.61 - 1.24 mg/dL 0.86 1.02 0.96  Sodium 135 - 145 mmol/L 136 134(L) 134(L)  Potassium 3.5 - 5.1 mmol/L 3.8 4.1 4.3  Chloride 98 - 111 mmol/L 99 97(L) 97(L)  CO2 22 - 32 mmol/L _1 Calcium 8.9 - 10.3 mg/dL 9.7 9.6 10.0  Total Protein 6.5 - 8.1 g/dL 6.8 7.1 7.4  Total Bilirubin 0.3 - 1.2 mg/dL 0.4 0.7 0.5  Alkaline Phos 38 - 126 U/L 85 80 82  AST 15 - 41 U/L 13(L) 19 14(L)  ALT 0 - 44 U/L _0 RADIOGRAPHIC STUDIES: I have personally reviewed the  radiological images as listed and agreed with the findings in the report: FDG avid distal esophagus as well as subcutaneous none lymph node region in the right neck. No results found.  ASSESSMENT & PLAN SKIPPY MARHEFKA 75 y.o. male with medical history significant for metastatic esophageal adenocarcinoma who presents for a follow up visit.   At this time Mr. Roam findings are most consistent with a metastatic adenocarcinoma of the esophagus.  Given his symptomatic dysphagia and inability to eat solid food at this time we recommend palliative radiation to his esophagus in order to help alleviate this.  Once the patient is able to tolerate food and is steady and his weight we will consider the addition of systemic chemotherapy.  The patient is negative for HER2 but has a high TPS score.  Therefore he would be an excellent candidate for FOLFOX with nivolumab.  The patient is wife voiced understanding of this plan moving forward.  We will set the patient meet with nutrition in order to help better administer calories.  #Metastatic Adenocarcinoma of the Esophagus #Dysphagia -- Currently undergoing palliative radiation in order to help alleviate his dysphagia.  This will complete Wednesday of next week. --Unfortunately given the spread of this cancer to the skin we will need to consider systemic therapy once he has completed palliative radiation -- Final testing on the tissue resulted with HER2 negative status, but TPS score of 60%.  Therefore he would be a good candidate for FOLFOX with nivolumab -- Plan on having a PEG tube placed to help with nutrition while awaiting results from the palliative radiation. -- Plan to return to clinic in approximately 2 weeks time in order to start the process of chemotherapy.  #Supportive Care -- chemotherapy education to be scheduled  -- port placement to be scheduled.  -- zofran 48m q8H PRN and compazine 133mPO q6H for nausea -- EMLA cream for port -- no pain  medication required at this time.  -- Patient will require PEG tube placement.  Consult to nutrition.   No orders of the defined types were placed in this encounter.  All questions were answered. The patient knows to call the clinic with any problems, questions or concerns.  A total of more than 30 minutes were spent on this encounter with face-to-face time and non-face-to-face time, including preparing to see the patient, ordering tests and/or medications, counseling the patient and coordination of care as outlined above.   JoLedell PeoplesMD Department of Hematology/Oncology [place]: [phone]: [phone]: [email or web address]  04/09/2021 5:03 PM

## 2021-04-11 ENCOUNTER — Other Ambulatory Visit: Payer: Self-pay | Admitting: Hematology and Oncology

## 2021-04-12 NOTE — Progress Notes (Signed)
                                                                                                                                                             Patient Name: Justin Trujillo MRN: [id number] DOB: 01-20-1946 Referring Physician: Carol Ada (Profile Not Attached) Date of Service: 04/05/2021 [facility], [name]                                                        End Of Treatment Note  Diagnoses: C15.5-Malignant neoplasm of lower third of esophagus  Cancer Staging: Stage IV esophageal adenocarcinoma involving the distal esophagus and right neck  Intent: Palliative  Radiation Treatment Dates: 03/16/2021 through 04/05/2021 Site Technique Total Dose (Gy) Dose per Fx (Gy) Completed Fx Beam Energies  Esophagus: Esoph IMRT 39/39 3 13/13 6X  Neck Right: HN_Rt specialPort 30/30 3 10/10 6E   Narrative: The patient tolerated radiation therapy relatively well.   Plan: The patient will receive a call in about one month from the radiation oncology department. He will continue follow up with Dr. Lorenso Courier as well.   ________________________________________________    Carola Rhine, [facility]

## 2021-04-18 ENCOUNTER — Other Ambulatory Visit: Payer: Self-pay

## 2021-04-18 DIAGNOSIS — R1319 Other dysphagia: Secondary | ICD-10-CM

## 2021-04-18 DIAGNOSIS — C159 Malignant neoplasm of esophagus, unspecified: Secondary | ICD-10-CM

## 2021-04-19 ENCOUNTER — Telehealth: Payer: Self-pay | Admitting: Hematology and Oncology

## 2021-04-19 DIAGNOSIS — S92355A Nondisplaced fracture of fifth metatarsal bone, left foot, initial encounter for closed fracture: Secondary | ICD-10-CM | POA: Diagnosis not present

## 2021-04-19 DIAGNOSIS — S92345A Nondisplaced fracture of fourth metatarsal bone, left foot, initial encounter for closed fracture: Secondary | ICD-10-CM | POA: Diagnosis not present

## 2021-04-19 DIAGNOSIS — W010XXA Fall on same level from slipping, tripping and stumbling without subsequent striking against object, initial encounter: Secondary | ICD-10-CM | POA: Diagnosis not present

## 2021-04-19 DIAGNOSIS — S99922A Unspecified injury of left foot, initial encounter: Secondary | ICD-10-CM | POA: Diagnosis not present

## 2021-04-19 NOTE — Telephone Encounter (Signed)
Scheduled appts per 8/3 sch msg. Called pt, no answer. Left msg with appts dates and times.  

## 2021-04-20 DIAGNOSIS — S92355A Nondisplaced fracture of fifth metatarsal bone, left foot, initial encounter for closed fracture: Secondary | ICD-10-CM | POA: Diagnosis not present

## 2021-04-20 DIAGNOSIS — S92345A Nondisplaced fracture of fourth metatarsal bone, left foot, initial encounter for closed fracture: Secondary | ICD-10-CM | POA: Diagnosis not present

## 2021-04-21 ENCOUNTER — Inpatient Hospital Stay: Payer: PPO | Attending: Hematology and Oncology

## 2021-04-21 ENCOUNTER — Other Ambulatory Visit: Payer: Self-pay | Admitting: Radiology

## 2021-04-21 ENCOUNTER — Other Ambulatory Visit: Payer: Self-pay

## 2021-04-21 DIAGNOSIS — I10 Essential (primary) hypertension: Secondary | ICD-10-CM | POA: Insufficient documentation

## 2021-04-21 DIAGNOSIS — J449 Chronic obstructive pulmonary disease, unspecified: Secondary | ICD-10-CM | POA: Insufficient documentation

## 2021-04-21 DIAGNOSIS — G250 Essential tremor: Secondary | ICD-10-CM | POA: Insufficient documentation

## 2021-04-21 DIAGNOSIS — R1310 Dysphagia, unspecified: Secondary | ICD-10-CM | POA: Insufficient documentation

## 2021-04-21 DIAGNOSIS — C155 Malignant neoplasm of lower third of esophagus: Secondary | ICD-10-CM | POA: Insufficient documentation

## 2021-04-21 DIAGNOSIS — R5383 Other fatigue: Secondary | ICD-10-CM | POA: Insufficient documentation

## 2021-04-21 DIAGNOSIS — E7800 Pure hypercholesterolemia, unspecified: Secondary | ICD-10-CM | POA: Insufficient documentation

## 2021-04-21 DIAGNOSIS — R110 Nausea: Secondary | ICD-10-CM | POA: Insufficient documentation

## 2021-04-21 DIAGNOSIS — F17210 Nicotine dependence, cigarettes, uncomplicated: Secondary | ICD-10-CM | POA: Insufficient documentation

## 2021-04-21 DIAGNOSIS — I2510 Atherosclerotic heart disease of native coronary artery without angina pectoris: Secondary | ICD-10-CM | POA: Insufficient documentation

## 2021-04-21 DIAGNOSIS — Z79899 Other long term (current) drug therapy: Secondary | ICD-10-CM | POA: Insufficient documentation

## 2021-04-21 DIAGNOSIS — E119 Type 2 diabetes mellitus without complications: Secondary | ICD-10-CM | POA: Insufficient documentation

## 2021-04-21 DIAGNOSIS — Z7984 Long term (current) use of oral hypoglycemic drugs: Secondary | ICD-10-CM | POA: Insufficient documentation

## 2021-04-21 DIAGNOSIS — Z5111 Encounter for antineoplastic chemotherapy: Secondary | ICD-10-CM | POA: Insufficient documentation

## 2021-04-24 ENCOUNTER — Encounter (HOSPITAL_COMMUNITY): Payer: Self-pay

## 2021-04-24 ENCOUNTER — Other Ambulatory Visit: Payer: Self-pay

## 2021-04-24 ENCOUNTER — Other Ambulatory Visit: Payer: Self-pay | Admitting: *Deleted

## 2021-04-24 ENCOUNTER — Ambulatory Visit (HOSPITAL_COMMUNITY)
Admission: RE | Admit: 2021-04-24 | Discharge: 2021-04-24 | Disposition: A | Discharge status: Home / Self Care | Payer: PPO | Source: Ambulatory Visit | Attending: Hematology and Oncology | Admitting: Hematology and Oncology

## 2021-04-24 DIAGNOSIS — F329 Major depressive disorder, single episode, unspecified: Secondary | ICD-10-CM | POA: Diagnosis not present

## 2021-04-24 DIAGNOSIS — E785 Hyperlipidemia, unspecified: Secondary | ICD-10-CM | POA: Insufficient documentation

## 2021-04-24 DIAGNOSIS — Z881 Allergy status to other antibiotic agents status: Secondary | ICD-10-CM | POA: Diagnosis not present

## 2021-04-24 DIAGNOSIS — Z452 Encounter for adjustment and management of vascular access device: Secondary | ICD-10-CM | POA: Diagnosis not present

## 2021-04-24 DIAGNOSIS — Z7982 Long term (current) use of aspirin: Secondary | ICD-10-CM | POA: Insufficient documentation

## 2021-04-24 DIAGNOSIS — Z91013 Allergy to seafood: Secondary | ICD-10-CM | POA: Diagnosis not present

## 2021-04-24 DIAGNOSIS — Z91040 Latex allergy status: Secondary | ICD-10-CM | POA: Insufficient documentation

## 2021-04-24 DIAGNOSIS — C159 Malignant neoplasm of esophagus, unspecified: Secondary | ICD-10-CM | POA: Insufficient documentation

## 2021-04-24 DIAGNOSIS — J449 Chronic obstructive pulmonary disease, unspecified: Secondary | ICD-10-CM | POA: Diagnosis not present

## 2021-04-24 DIAGNOSIS — Z79899 Other long term (current) drug therapy: Secondary | ICD-10-CM | POA: Diagnosis not present

## 2021-04-24 DIAGNOSIS — G4730 Sleep apnea, unspecified: Secondary | ICD-10-CM | POA: Insufficient documentation

## 2021-04-24 DIAGNOSIS — Z7984 Long term (current) use of oral hypoglycemic drugs: Secondary | ICD-10-CM | POA: Insufficient documentation

## 2021-04-24 DIAGNOSIS — Z794 Long term (current) use of insulin: Secondary | ICD-10-CM | POA: Insufficient documentation

## 2021-04-24 DIAGNOSIS — I10 Essential (primary) hypertension: Secondary | ICD-10-CM | POA: Insufficient documentation

## 2021-04-24 DIAGNOSIS — Z880 Allergy status to penicillin: Secondary | ICD-10-CM | POA: Insufficient documentation

## 2021-04-24 DIAGNOSIS — Z91041 Radiographic dye allergy status: Secondary | ICD-10-CM | POA: Insufficient documentation

## 2021-04-24 DIAGNOSIS — I2510 Atherosclerotic heart disease of native coronary artery without angina pectoris: Secondary | ICD-10-CM | POA: Diagnosis not present

## 2021-04-24 HISTORY — PX: IR IMAGING GUIDED PORT INSERTION: IMG5740

## 2021-04-24 LAB — GLUCOSE, CAPILLARY: Glucose-Capillary: 93 mg/dL (ref 70–99)

## 2021-04-24 MED ORDER — FENTANYL CITRATE (PF) 100 MCG/2ML IJ SOLN
INTRAMUSCULAR | Status: AC | PRN
  Administered 2021-04-24: 50 ug via INTRAVENOUS

## 2021-04-24 MED ORDER — LIDOCAINE-EPINEPHRINE 1 %-1:100000 IJ SOLN
INTRAMUSCULAR | Status: AC
  Filled 2021-04-24: qty 1

## 2021-04-24 MED ORDER — LIDOCAINE-EPINEPHRINE 1 %-1:100000 IJ SOLN
INTRAMUSCULAR | Status: AC | PRN
  Administered 2021-04-24: 20 mL

## 2021-04-24 MED ORDER — LIDOCAINE-PRILOCAINE 2.5-2.5 % EX CREA: 1.0000 "application " | TOPICAL_CREAM | CUTANEOUS | 0 refills | Status: DC | PRN

## 2021-04-24 MED ORDER — HEPARIN SOD (PORK) LOCK FLUSH 100 UNIT/ML IV SOLN
INTRAVENOUS | Status: AC | PRN
  Administered 2021-04-24: 500 [IU] via INTRAVENOUS

## 2021-04-24 MED ORDER — SODIUM CHLORIDE 0.9 % IV SOLN: INTRAVENOUS | Status: DC

## 2021-04-24 MED ORDER — ONDANSETRON HCL 8 MG PO TABS: 8.0000 mg | ORAL_TABLET | Freq: Three times a day (TID) | ORAL | 0 refills | Status: DC | PRN

## 2021-04-24 MED ORDER — MIDAZOLAM HCL 2 MG/2ML IJ SOLN
INTRAMUSCULAR | Status: AC
  Filled 2021-04-24: qty 4

## 2021-04-24 MED ORDER — MIDAZOLAM HCL 2 MG/2ML IJ SOLN
INTRAMUSCULAR | Status: AC | PRN
  Administered 2021-04-24: 1 mg via INTRAVENOUS

## 2021-04-24 MED ORDER — PROCHLORPERAZINE MALEATE 10 MG PO TABS: 10.0000 mg | ORAL_TABLET | Freq: Four times a day (QID) | ORAL | 0 refills | Status: DC | PRN

## 2021-04-24 MED ORDER — FENTANYL CITRATE (PF) 100 MCG/2ML IJ SOLN
INTRAMUSCULAR | Status: AC
  Filled 2021-04-24: qty 2

## 2021-04-24 MED ORDER — HEPARIN SOD (PORK) LOCK FLUSH 100 UNIT/ML IV SOLN
INTRAVENOUS | Status: AC
  Filled 2021-04-24: qty 5

## 2021-04-24 NOTE — H&P (Signed)
Referring Physician(s): Blaine  Supervising Physician: Ruthann Cancer  Patient Status:  [facility]  Chief Complaint:  "I'm getting a port a cath"  Subjective: Patient familiar to our service from L4 kyphoplasty in 2019. He has a past medical history significant for arthritis, coronary artery disease, depression, hyperlipidemia, hypertension, COPD, sleep apnea, DM, and newly diagnosed metastatic esophageal carcinoma. He  presents today for Port-A-Cath placement for  chemotherapy. He currently denies fever, headache, chest pain, dyspnea, cough, abdominal pain, nausea, vomiting or bleeding. He does have low back pain as well as pain in left foot secondary to toe fractures.  Past Medical History:  Diagnosis Date   Arthritis    Coronary atherosclerosis of native coronary artery    Depression    Essential tremor    High cholesterol    Hypertension    Hypertension    Hypogonadism male    Obstructive chronic bronchitis with exacerbation (HCC)    Obstructive sleep apnea (adult) (pediatric)    no cpap   Other and unspecified hyperlipidemia    Other emphysema ([place])    Proteinuria 2019   has seen a nephrologist   Shortness of breath    with excertion   Situational stress    Type II or unspecified type diabetes mellitus without mention of complication, not stated as uncontrolled    type 2   Unspecified essential hypertension    Past Surgical History:  Procedure Laterality Date   CARDIAC CATHETERIZATION     IR KYPHO LUMBAR INC FX REDUCE BONE BX UNI/BIL CANNULATION INC/IMAGING  06/13/2018   L lens implant     R knee replacement x2     REVERSE SHOULDER ARTHROPLASTY Left 03/26/2019   Procedure: REVERSE SHOULDER ARTHROPLASTY;  Surgeon: Justice Britain, MD;  Location: [facility];  Service: Orthopedics;  Laterality: Left;  124mn   TONSILLECTOMY     TONSILLECTOMY AND ADENOIDECTOMY     vascectomy          Allergies: Iodinated diagnostic agents, Moxifloxacin, Penicillins,  Shellfish-derived products, Amoxicillin, Other, and Latex  Medications: Prior to Admission medications   Medication Sig Start Date End Date Taking? Authorizing Provider  acetaminophen (TYLENOL) 500 MG tablet Take 650 mg by mouth every 8 (eight) hours as needed (pain).   Yes [provider]  ALPRAZolam (Duanne Moron 0.5 MG tablet Take 0.5 mg by mouth 2 (two) times daily.   Yes [provider]  aspirin EC 81 MG tablet Take 81 mg by mouth daily.   Yes [provider]  atorvastatin (LIPITOR) 40 MG tablet Take 1 tablet (40 mg total) by mouth daily. 07/23/12  Yes Weber, SDamaris Hippo PA-C  Cholecalciferol (VITAMIN D) 50 MCG (2000 UT) tablet Take 2,000 Units by mouth daily.   Yes [provider]  escitalopram (LEXAPRO) 10 MG tablet Take 10 mg by mouth daily.   Yes [provider]  gabapentin (NEURONTIN) 300 MG capsule Take 2 capsules (600 mg total) by mouth 3 (three) times daily. Patient taking differently: Take 300 mg by mouth 3 (three) times daily. 10/31/18  Yes EKristeen Miss MD  lisinopril-hydrochlorothiazide (ZESTORETIC) 20-12.5 MG tablet Take 1 tablet by mouth daily. 12.5 mg only 07/05/14  Yes [provider]  metFORMIN (GLUCOPHAGE) 1000 MG tablet Take 1 tablet (1,000 mg total) by mouth 2 (two) times daily with a meal. Patient taking differently: Take 1,000 mg by mouth at bedtime. 07/23/12  Yes Weber, SDamaris Hippo PA-C  Multiple Vitamins-Minerals (CENTRUM SILVER) tablet daily in the afternoon.  Yes [provider]  MYRBETRIQ 50 MG TB24 tablet Take 50 mg by mouth daily. 12/26/20  Yes [provider]  oxyCODONE (ROXICODONE) 5 MG/5ML solution Take 10 mLs (10 mg total) by mouth every 4 (four) hours as needed for severe pain. 03/07/21  Yes Hayden Pedro, PA-C  rOPINIRole (REQUIP) 0.5 MG tablet Take 0.5 mg by mouth at bedtime.   Yes [provider]  Semaglutide (OZEMPIC, 1 MG/DOSE, [place]) Inject 1 each into the skin once a week.   Yes  [provider]  sucralfate (CARAFATE) 1 g tablet Take 1 tablet (1 g total) by mouth 4 (four) times daily -  with meals and at bedtime. 03/07/21  Yes Hayden Pedro, PA-C  tamsulosin (FLOMAX) 0.4 MG CAPS capsule Take 0.4 mg by mouth daily. 01/03/21  Yes [provider]  testosterone cypionate (DEPOTESTOSTERONE CYPIONATE) 200 MG/ML injection Inject 200 mg into the muscle every 14 (fourteen) days. Every 2 weeks 10/24/16  Yes [provider]  tiZANidine (ZANAFLEX) 4 MG tablet SMARTSIG:1 Tablet(s) By Mouth 1 to 3 Times Daily PRN 01/23/21  Yes [provider]  trolamine salicylate (ASPERCREME) 10 % cream Apply 1 application topically 2 (two) times daily as needed for muscle pain.    Yes [provider]  Calcium Carb-Cholecalciferol (CALCIUM 600+D3 PO) Take 1 tablet by mouth daily. Patient not taking: Reported on 03/07/2021    [provider]  calcium citrate-vitamin D (CITRACAL+D) 315-200 MG-UNIT tablet daily in the afternoon. Patient not taking: Reported on 03/07/2021    [provider]  insulin isophane & regular human (NOVOLIN 70/30 FLEXPEN) (70-30) 100 UNIT/ML KwikPen as needed. Patient not taking: No sig reported    [provider]  Lidocaine 4 % PTCH as needed. Patient not taking: No sig reported    [provider]  primidone (MYSOLINE) 50 MG tablet Take 1 tablet (50 mg total) by mouth at bedtime. Patient not taking: Reported on 03/07/2021 12/20/20   Tat, Eustace Quail, DO     Vital Signs: BP 115/87   Pulse 96   Temp 98 F (36.7 C) (Oral)   Resp 18   SpO2 97%   Physical Exam awake, alert.  Chest with distant breath sounds bilaterally.  Heart with regular rate and rhythm, positive murmur.  Abdomen soft, positive bowel sounds, nontender.  No lower extremity edema.  Imaging: No results found.  Labs:  CBC: Recent Labs    02/06/21 1553 03/03/21 1001 03/30/21 1412  WBC 11.9* 9.4 8.4  HGB 15.2 15.4 14.4  HCT  47.5 47.9 43.9  PLT 269 258 194    COAGS: No results for input(s): INR, APTT in the last 8760 hours.  BMP: Recent Labs    01/10/21 0959 02/06/21 1553 03/03/21 1001 03/30/21 1412  NA 133* 134* 134* 136  K 4.8 4.3 4.1 3.8  CL 93* 97* 97* 99  CO2 '24 28 27 26  '$ GLUCOSE 99 80 86 197*  BUN '16 12 10 21  '$ CALCIUM 10.2 10.0 9.6 9.7  CREATININE 1.15 0.96 1.02 0.86  GFRNONAA  --  >60 >60 >60    LIVER FUNCTION TESTS: Recent Labs    02/06/21 1553 03/03/21 1001 03/30/21 1412  BILITOT 0.5 0.7 0.4  AST 14* 19 13*  ALT '7 10 7  '$ ALKPHOS 82 80 85  PROT 7.4 7.1 6.8  ALBUMIN 4.1 4.1 3.4*    Assessment and Plan: Patient familiar to our service from L4 kyphoplasty in 2019. He has a past medical  history significant for arthritis, coronary artery disease, depression, hyperlipidemia, hypertension, COPD, sleep apnea, DM, and newly diagnosed metastatic esophageal carcinoma. He  presents today for Port-A-Cath placement for  chemotherapy.Risks and benefits of image guided port-a-catheter placement was discussed with the patient including, but not limited to bleeding, infection, pneumothorax, or fibrin sheath development and need for additional procedures.  All of the patient's questions were answered, patient is agreeable to proceed. Consent signed and in chart.    Electronically Signed: D. Rowe Robert, PA-C 04/24/2021, 1:50 PM   I spent a total of 25 Minutes at the the patient's bedside AND on the patient's hospital floor or unit, greater than 50% of which was counseling/coordinating care for port a cath placement

## 2021-04-24 NOTE — Progress Notes (Deleted)
Assessment/Plan:    1.  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  2.  Chronic pain syndrome  -Followed by pain management.  On chronic opioid therapy  3.  GAD  Subjective:   Justin Trujillo was seen today in follow up for essential tremor.  My previous records were reviewed prior to todays visit.  Patient was started on primidone last visit.  We also discussed that his tremor was likely made worse by GAD/PTSD and he would need separate treatment for that.  We also discussed last visit that if at all possible, I certainly would recommend tapering the twice daily Xanax and trying to treat anxiety with something that can be used more safely on a long-term basis in this age group.  Patient remains on the combination of twice daily Xanax and oxycodone.  PDMP is reviewed.  Since our last visit, the patient was diagnosed with esophageal adenocarcinoma.  This was ultimately diagnosed after being referred to dermatology for skin biopsy, which showed adenocarcinoma of unknown origin.  He was referred to oncology for further work-up, and it was ultimately determined that he had esophageal adenocarcinoma.  This cancer has caused dysphagia.  They have been doing radiation because of that.   Current prescribed movement disorder medications: ***Primidone, 50 mg nightly   PREVIOUS MEDICATIONS: {Parkinson's RX:18200}  ALLERGIES:   Allergies  Allergen Reactions   Iodinated Diagnostic Agents Anaphylaxis   Moxifloxacin Swelling    REACTION: GI upset, throat "tightened up"   Penicillins Anaphylaxis    Did it involve swelling of the face/tongue/throat, SOB, or low BP? Yes Did it involve sudden or severe rash/hives, skin peeling, or any reaction on the inside of your mouth or nose? No Did you need to seek medical attention at a hospital or doctor's office? Yes When did it last happen?   been a  while     If all above answers are "NO", may proceed with cephalosporin use.    Shellfish-Derived Products Anaphylaxis   Amoxicillin Other (See Comments)   Other Other (See Comments)   Latex Rash    Over long periods of time    CURRENT MEDICATIONS:  Outpatient Encounter Medications as of 04/25/2021  Medication Sig   acetaminophen (TYLENOL) 500 MG tablet Take 650 mg by mouth every 8 (eight) hours as needed (pain).   ALPRAZolam (XANAX) 0.5 MG tablet Take 0.5 mg by mouth 2 (two) times daily.   aspirin EC 81 MG tablet Take 81 mg by mouth daily.   atorvastatin (LIPITOR) 40 MG tablet Take 1 tablet (40 mg total) by mouth daily.   Calcium Carb-Cholecalciferol (CALCIUM 600+D3 PO) Take 1 tablet by mouth daily. (Patient not taking: Reported on 03/07/2021)   calcium citrate-vitamin D (CITRACAL+D) 315-200 MG-UNIT tablet daily in the afternoon. (Patient not taking: Reported on 03/07/2021)   Cholecalciferol (VITAMIN D) 50 MCG (2000 UT) tablet Take 2,000 Units by mouth daily.   escitalopram (LEXAPRO) 10 MG tablet Take 10 mg by mouth daily.   gabapentin (NEURONTIN) 300 MG capsule Take 2 capsules (600 mg total) by mouth 3 (three) times daily. (Patient taking differently: Take 300 mg by mouth 3 (three) times daily.)   insulin isophane & regular human (NOVOLIN 70/30 FLEXPEN) (70-30) 100 UNIT/ML KwikPen as needed. (Patient not taking: No sig reported)   Lidocaine 4 % PTCH as needed. (Patient not taking: No sig reported)   lisinopril-hydrochlorothiazide (ZESTORETIC) 20-12.5 MG  tablet Take 1 tablet by mouth daily. 12.5 mg only   metFORMIN (GLUCOPHAGE) 1000 MG tablet Take 1 tablet (1,000 mg total) by mouth 2 (two) times daily with a meal. (Patient taking differently: Take 1,000 mg by mouth at bedtime.)   Multiple Vitamins-Minerals (CENTRUM SILVER) tablet daily in the afternoon. (Patient not taking: Reported on 03/07/2021)   MYRBETRIQ 50 MG TB24 tablet Take 50 mg by mouth daily.   oxyCODONE (ROXICODONE) 5 MG/5ML  solution Take 10 mLs (10 mg total) by mouth every 4 (four) hours as needed for severe pain.   primidone (MYSOLINE) 50 MG tablet Take 1 tablet (50 mg total) by mouth at bedtime. (Patient not taking: Reported on 03/07/2021)   rOPINIRole (REQUIP) 0.5 MG tablet Take 0.5 mg by mouth at bedtime.   Semaglutide (OZEMPIC, 1 MG/DOSE, [place]) Inject 1 each into the skin once a week.   sucralfate (CARAFATE) 1 g tablet Take 1 tablet (1 g total) by mouth 4 (four) times daily -  with meals and at bedtime.   tamsulosin (FLOMAX) 0.4 MG CAPS capsule Take 0.4 mg by mouth daily.   testosterone cypionate (DEPOTESTOSTERONE CYPIONATE) 200 MG/ML injection Inject 200 mg into the muscle every 14 (fourteen) days. Every 2 weeks   tiZANidine (ZANAFLEX) 4 MG tablet SMARTSIG:1 Tablet(s) By Mouth 1 to 3 Times Daily PRN   trolamine salicylate (ASPERCREME) 10 % cream Apply 1 application topically 2 (two) times daily as needed for muscle pain.    No facility-administered encounter medications on file as of 04/25/2021.     Objective:    PHYSICAL EXAMINATION:    VITALS:  There were no vitals filed for this visit.  GEN:  The patient appears stated age and is in NAD. HEENT:  Normocephalic, atraumatic.  The mucous membranes are moist. The superficial temporal arteries are without ropiness or tenderness. CV:  RRR Lungs:  CTAB Neck/HEME:  There are no carotid bruits bilaterally.  Neurological examination:  Orientation: The patient is alert and oriented x3. Cranial nerves: There is good facial symmetry. The speech is fluent and clear. Soft palate rises symmetrically and there is no tongue deviation. Hearing is intact to conversational tone. Sensation: Sensation is intact to light touch throughout Motor: Strength is at least antigravity x4.  Movement examination: Tone: There is normal tone in the UE/LE Abnormal movements: *** Coordination:  There is *** decremation with RAM's, *** Gait and Station: The patient has *** difficulty  arising out of a deep-seated chair without the use of the hands. The patient's stride length is good I have reviewed and interpreted the following labs independently   Chemistry      Component Value Date/Time   NA 136 03/30/2021 1412   NA 133 (L) 01/10/2021 0959   K 3.8 03/30/2021 1412   CL 99 03/30/2021 1412   CO2 26 03/30/2021 1412   BUN 21 03/30/2021 1412   BUN 16 01/10/2021 0959   CREATININE 0.86 03/30/2021 1412   CREATININE 0.76 01/14/2012 0750      Component Value Date/Time   CALCIUM 9.7 03/30/2021 1412   ALKPHOS 85 03/30/2021 1412   AST 13 (L) 03/30/2021 1412   ALT 7 03/30/2021 1412   BILITOT 0.4 03/30/2021 1412      Lab Results  Component Value Date   WBC 8.4 03/30/2021   HGB 14.4 03/30/2021   HCT 43.9 03/30/2021   MCV 85.9 03/30/2021   PLT 194 03/30/2021   Lab Results  Component Value Date   TSH 0.800 01/14/2012  Chemistry      Component Value Date/Time   NA 136 03/30/2021 1412   NA 133 (L) 01/10/2021 0959   K 3.8 03/30/2021 1412   CL 99 03/30/2021 1412   CO2 26 03/30/2021 1412   BUN 21 03/30/2021 1412   BUN 16 01/10/2021 0959   CREATININE 0.86 03/30/2021 1412   CREATININE 0.76 01/14/2012 0750      Component Value Date/Time   CALCIUM 9.7 03/30/2021 1412   ALKPHOS 85 03/30/2021 1412   AST 13 (L) 03/30/2021 1412   ALT 7 03/30/2021 1412   BILITOT 0.4 03/30/2021 1412         Total time spent on today's visit was ***30 minutes, including both face-to-face time and nonface-to-face time.  Time included that spent on review of records (prior notes available to me/labs/imaging if pertinent), discussing treatment and goals, answering patient's questions and coordinating care.  Cc:  Carol Ada, MD

## 2021-04-24 NOTE — Discharge Instructions (Signed)

## 2021-04-24 NOTE — Procedures (Signed)
Interventional Radiology Procedure Note ° °Procedure: Single Lumen Power Port Placement   ° °Access:  Right internal jugular vein ° °Findings: Catheter tip positioned at cavoatrial junction. Port is ready for immediate use.  ° °Complications: None ° °EBL: < 10 mL ° °Recommendations:  °- Ok to shower in 24 hours °- Do not submerge for 7 days °- Routine line care  ° ° °Caliann Leckrone, MD ° ° ° °

## 2021-04-25 ENCOUNTER — Ambulatory Visit: Payer: PPO | Admitting: Neurology

## 2021-04-26 ENCOUNTER — Inpatient Hospital Stay: Payer: PPO | Admitting: Hematology and Oncology

## 2021-04-26 ENCOUNTER — Inpatient Hospital Stay: Payer: PPO

## 2021-04-26 ENCOUNTER — Other Ambulatory Visit: Payer: Self-pay | Admitting: Hematology and Oncology

## 2021-04-26 ENCOUNTER — Encounter: Payer: Self-pay | Admitting: Hematology and Oncology

## 2021-04-26 ENCOUNTER — Other Ambulatory Visit: Payer: Self-pay

## 2021-04-26 VITALS — BP 100/72 | HR 93 | Temp 97.4°F | Resp 18 | Wt 189.0 lb

## 2021-04-26 DIAGNOSIS — R110 Nausea: Secondary | ICD-10-CM | POA: Diagnosis not present

## 2021-04-26 DIAGNOSIS — Z79899 Other long term (current) drug therapy: Secondary | ICD-10-CM | POA: Diagnosis not present

## 2021-04-26 DIAGNOSIS — Z95828 Presence of other vascular implants and grafts: Secondary | ICD-10-CM | POA: Diagnosis not present

## 2021-04-26 DIAGNOSIS — C159 Malignant neoplasm of esophagus, unspecified: Secondary | ICD-10-CM | POA: Diagnosis not present

## 2021-04-26 DIAGNOSIS — C155 Malignant neoplasm of lower third of esophagus: Secondary | ICD-10-CM

## 2021-04-26 DIAGNOSIS — F17210 Nicotine dependence, cigarettes, uncomplicated: Secondary | ICD-10-CM | POA: Diagnosis not present

## 2021-04-26 DIAGNOSIS — J449 Chronic obstructive pulmonary disease, unspecified: Secondary | ICD-10-CM | POA: Diagnosis not present

## 2021-04-26 DIAGNOSIS — I2510 Atherosclerotic heart disease of native coronary artery without angina pectoris: Secondary | ICD-10-CM | POA: Diagnosis not present

## 2021-04-26 DIAGNOSIS — C153 Malignant neoplasm of upper third of esophagus: Secondary | ICD-10-CM | POA: Diagnosis not present

## 2021-04-26 DIAGNOSIS — R1310 Dysphagia, unspecified: Secondary | ICD-10-CM | POA: Diagnosis not present

## 2021-04-26 DIAGNOSIS — I10 Essential (primary) hypertension: Secondary | ICD-10-CM | POA: Diagnosis not present

## 2021-04-26 DIAGNOSIS — G250 Essential tremor: Secondary | ICD-10-CM | POA: Diagnosis not present

## 2021-04-26 DIAGNOSIS — R5383 Other fatigue: Secondary | ICD-10-CM | POA: Diagnosis not present

## 2021-04-26 DIAGNOSIS — Z5111 Encounter for antineoplastic chemotherapy: Secondary | ICD-10-CM | POA: Diagnosis not present

## 2021-04-26 DIAGNOSIS — E119 Type 2 diabetes mellitus without complications: Secondary | ICD-10-CM | POA: Diagnosis not present

## 2021-04-26 DIAGNOSIS — Z7984 Long term (current) use of oral hypoglycemic drugs: Secondary | ICD-10-CM | POA: Diagnosis not present

## 2021-04-26 DIAGNOSIS — E7800 Pure hypercholesterolemia, unspecified: Secondary | ICD-10-CM | POA: Diagnosis not present

## 2021-04-26 LAB — LACTATE DEHYDROGENASE: LDH: 158 U/L (ref 98–192)

## 2021-04-26 LAB — CBC WITH DIFFERENTIAL (CANCER CENTER ONLY)
Abs Immature Granulocytes: 0.06 10*3/uL (ref 0.00–0.07)
Basophils Absolute: 0.1 10*3/uL (ref 0.0–0.1)
Basophils Relative: 1 %
Eosinophils Absolute: 0.3 10*3/uL (ref 0.0–0.5)
Eosinophils Relative: 3 %
HCT: 39.7 % (ref 39.0–52.0)
Hemoglobin: 12.9 g/dL — ABNORMAL LOW (ref 13.0–17.0)
Immature Granulocytes: 1 %
Lymphocytes Relative: 9 %
Lymphs Abs: 0.8 10*3/uL (ref 0.7–4.0)
MCH: 28.4 pg (ref 26.0–34.0)
MCHC: 32.5 g/dL (ref 30.0–36.0)
MCV: 87.3 fL (ref 80.0–100.0)
Monocytes Absolute: 0.6 10*3/uL (ref 0.1–1.0)
Monocytes Relative: 7 %
Neutro Abs: 7.6 10*3/uL (ref 1.7–7.7)
Neutrophils Relative %: 79 %
Platelet Count: 195 10*3/uL (ref 150–400)
RBC: 4.55 MIL/uL (ref 4.22–5.81)
RDW: 17.8 % — ABNORMAL HIGH (ref 11.5–15.5)
WBC Count: 9.4 10*3/uL (ref 4.0–10.5)
nRBC: 0 % (ref 0.0–0.2)

## 2021-04-26 LAB — CMP (CANCER CENTER ONLY)
ALT: 11 U/L (ref 0–44)
AST: 16 U/L (ref 15–41)
Albumin: 3.2 g/dL — ABNORMAL LOW (ref 3.5–5.0)
Alkaline Phosphatase: 81 U/L (ref 38–126)
Anion gap: 9 (ref 5–15)
BUN: 16 mg/dL (ref 8–23)
CO2: 26 mmol/L (ref 22–32)
Calcium: 9.7 mg/dL (ref 8.9–10.3)
Chloride: 99 mmol/L (ref 98–111)
Creatinine: 0.73 mg/dL (ref 0.61–1.24)
GFR, Estimated: >60 mL/min (ref >60)
Glucose, Bld: 62 mg/dL — ABNORMAL LOW (ref 70–99)
Potassium: 3.8 mmol/L (ref 3.5–5.1)
Sodium: 134 mmol/L — ABNORMAL LOW (ref 135–145)
Total Bilirubin: 0.4 mg/dL (ref 0.3–1.2)
Total Protein: 6.6 g/dL (ref 6.5–8.1)

## 2021-04-26 LAB — MAGNESIUM: Magnesium: 1.8 mg/dL (ref 1.7–2.4)

## 2021-04-26 MED ORDER — DEXTROSE 5 % IV SOLN
Freq: Once | INTRAVENOUS | Status: AC
  Filled 2021-04-26: qty 250

## 2021-04-26 MED ORDER — FLUOROURACIL CHEMO INJECTION 5 GM/100ML
2475.0000 mg/m2 | INTRAVENOUS | Status: DC
  Administered 2021-04-26: 5000 mg via INTRAVENOUS
  Filled 2021-04-26: qty 100

## 2021-04-26 MED ORDER — SODIUM CHLORIDE 0.9% FLUSH
10.0000 mL | INTRAVENOUS | Status: DC | PRN
  Administered 2021-04-26: 10 mL
  Filled 2021-04-26: qty 10

## 2021-04-26 MED ORDER — OXALIPLATIN CHEMO INJECTION 100 MG/20ML
85.0000 mg/m2 | Freq: Once | INTRAVENOUS | Status: AC
  Administered 2021-04-26: 170 mg via INTRAVENOUS
  Filled 2021-04-26: qty 34

## 2021-04-26 MED ORDER — LEUCOVORIN CALCIUM INJECTION 350 MG
400.0000 mg/m2 | Freq: Once | INTRAVENOUS | Status: AC
  Administered 2021-04-26: 808 mg via INTRAVENOUS
  Filled 2021-04-26: qty 40.4

## 2021-04-26 MED ORDER — SODIUM CHLORIDE 0.9 % IV SOLN
10.0000 mg | Freq: Once | INTRAVENOUS | Status: AC
  Administered 2021-04-26: 10 mg via INTRAVENOUS
  Filled 2021-04-26: qty 10

## 2021-04-26 MED ORDER — FLUOROURACIL CHEMO INJECTION 2.5 GM/50ML
400.0000 mg/m2 | Freq: Once | INTRAVENOUS | Status: AC
  Administered 2021-04-26: 800 mg via INTRAVENOUS
  Filled 2021-04-26: qty 16

## 2021-04-26 MED ORDER — SODIUM CHLORIDE 0.9 % IV SOLN
240.0000 mg | Freq: Once | INTRAVENOUS | Status: AC
  Administered 2021-04-26: 240 mg via INTRAVENOUS
  Filled 2021-04-26: qty 24

## 2021-04-26 MED ORDER — PALONOSETRON HCL INJECTION 0.25 MG/5ML
0.2500 mg | Freq: Once | INTRAVENOUS | Status: AC
  Administered 2021-04-26: 0.25 mg via INTRAVENOUS
  Filled 2021-04-26: qty 5

## 2021-04-26 MED ORDER — SODIUM CHLORIDE 0.9 % IV SOLN
Freq: Once | INTRAVENOUS | Status: DC
  Filled 2021-04-26: qty 250

## 2021-04-26 NOTE — Patient Instructions (Signed)
[facility]  Discharge Instructions: Thank you for choosing [facility] to provide your oncology and hematology care.   If you have a lab appointment with the [facility], please go directly to the [facility] and check in at the registration area.   Wear comfortable clothing and clothing appropriate for easy access to any Portacath or PICC line.   We strive to give you quality time with your provider. You may need to reschedule your appointment if you arrive late (15 or more minutes).  Arriving late affects you and other patients whose appointments are after yours.  Also, if you miss three or more appointments without notifying the office, you may be dismissed from the clinic at the provider's discretion.      For prescription refill requests, have your pharmacy contact our office and allow 72 hours for refills to be completed.    Today you received the following chemotherapy and/or immunotherapy agents opdivo/oxaliplatin/leucovorin/fluorouracil       To help prevent nausea and vomiting after your treatment, we encourage you to take your nausea medication as directed.  BELOW ARE SYMPTOMS THAT SHOULD BE REPORTED IMMEDIATELY: *FEVER GREATER THAN 100.4 F (38 C) OR HIGHER *CHILLS OR SWEATING *NAUSEA AND VOMITING THAT IS NOT CONTROLLED WITH YOUR NAUSEA MEDICATION *UNUSUAL SHORTNESS OF BREATH *UNUSUAL BRUISING OR BLEEDING *URINARY PROBLEMS (pain or burning when urinating, or frequent urination) *BOWEL PROBLEMS (unusual diarrhea, constipation, pain near the anus) TENDERNESS IN MOUTH AND THROAT WITH OR WITHOUT PRESENCE OF ULCERS (sore throat, sores in mouth, or a toothache) UNUSUAL RASH, SWELLING OR PAIN  UNUSUAL VAGINAL DISCHARGE OR ITCHING   Items with * indicate a potential emergency and should be followed up as soon as possible or go to the Emergency Department if any problems should occur.  Please show the CHEMOTHERAPY ALERT CARD or  IMMUNOTHERAPY ALERT CARD at check-in to the Emergency Department and triage nurse.  Should you have questions after your visit or need to cancel or reschedule your appointment, please contact [facility]  Dept: [phone]  and follow the prompts.  Office hours are 8:00 a.m. to 4:30 p.m. Monday - Friday. Please note that voicemails left after 4:00 p.m. may not be returned until the following business day.  We are closed weekends and major holidays. You have access to a nurse at all times for urgent questions. Please call the main number to the clinic Dept: [phone] and follow the prompts.   For any non-urgent questions, you may also contact your provider using MyChart. We now offer e-Visits for anyone 44 and older to request care online for non-urgent symptoms. For details visit [email or web address].   Also download the MyChart app! Go to the app store, search "MyChart", open the app, select [facility], and log in with your MyChart username and password.  Due to Covid, a mask is required upon entering the hospital/clinic. If you do not have a mask, one will be given to you upon arrival. For doctor visits, patients may have 1 support person aged 21 or older with them. For treatment visits, patients cannot have anyone with them due to current Covid guidelines and our immunocompromised population.   Nivolumab injection What is this medication? NIVOLUMAB (nye VOL ue mab) is a monoclonal antibody. It treats certain types of cancer. Some of the cancers treated are colon cancer, head and neck cancer,Hodgkin lymphoma, lung cancer, and melanoma. This medicine may be used for other purposes; ask your  health care provider orpharmacist if you have questions. COMMON BRAND NAME(S): Opdivo What should I tell my care team before I take this medication? They need to know if you have any of these conditions: autoimmune diseases like Crohn's disease, ulcerative colitis, or  lupus have had or planning to have an allogeneic stem cell transplant (uses someone else's stem cells) history of chest radiation history of organ transplant nervous system problems like myasthenia gravis or Guillain-Barre syndrome an unusual or allergic reaction to nivolumab, other medicines, foods, dyes, or preservatives pregnant or trying to get pregnant breast-feeding How should I use this medication? This medicine is for infusion into a vein. It is given by a health careprofessional in a hospital or clinic setting. A special MedGuide will be given to you before each treatment. Be sure to readthis information carefully each time. Talk to your pediatrician regarding the use of this medicine in children. While this drug may be prescribed for children as young as 12 years for selectedconditions, precautions do apply. Overdosage: If you think you have taken too much of this medicine contact apoison control center or emergency room at once. NOTE: This medicine is only for you. Do not share this medicine with others. What if I miss a dose? It is important not to miss your dose. Call your doctor or health careprofessional if you are unable to keep an appointment. What may interact with this medication? Interactions have not been studied. This list may not describe all possible interactions. Give your health care provider a list of all the medicines, herbs, non-prescription drugs, or dietary supplements you use. Also tell them if you smoke, drink alcohol, or use illegaldrugs. Some items may interact with your medicine. What should I watch for while using this medication? This drug may make you feel generally unwell. Continue your course of treatmenteven though you feel ill unless your doctor tells you to stop. You may need blood work done while you are taking this medicine. Do not become pregnant while taking this medicine or for 5 months after stopping it. Women should inform their doctor if they  wish to become pregnant or think they might be pregnant. There is a potential for serious side effects to an unborn child. Talk to your health care professional or pharmacist for more information. Do not breast-feed an infant while taking this medicine orfor 5 months after stopping it. What side effects may I notice from receiving this medication? Side effects that you should report to your doctor or health care professionalas soon as possible: allergic reactions like skin rash, itching or hives, swelling of the face, lips, or tongue breathing problems blood in the urine bloody or watery diarrhea or black, tarry stools changes in emotions or moods changes in vision chest pain cough dizziness feeling faint or lightheaded, falls fever, chills headache with fever, neck stiffness, confusion, loss of memory, sensitivity to light, hallucination, loss of contact with reality, or seizures joint pain mouth sores redness, blistering, peeling or loosening of the skin, including inside the mouth severe muscle pain or weakness signs and symptoms of high blood sugar such as dizziness; dry mouth; dry skin; fruity breath; nausea; stomach pain; increased hunger or thirst; increased urination signs and symptoms of kidney injury like trouble passing urine or change in the amount of urine signs and symptoms of liver injury like dark yellow or brown urine; general ill feeling or flu-like symptoms; light-colored stools; loss of appetite; nausea; right upper belly pain; unusually weak or tired; yellowing of  the eyes or skin swelling of the ankles, feet, hands trouble passing urine or change in the amount of urine unusually weak or tired weight gain or loss Side effects that usually do not require medical attention (report to yourdoctor or health care professional if they continue or are bothersome): bone pain constipation decreased appetite diarrhea muscle pain nausea, vomiting tiredness This list may not  describe all possible side effects. Call your doctor for medical advice about side effects. You may report side effects to FDA at1-800-FDA-1088. Where should I keep my medication? This drug is given in a hospital or clinic and will not be stored at home. NOTE: This sheet is a summary. It may not cover all possible information. If you have questions about this medicine, talk to your doctor, pharmacist, orhealth care provider.  2022 Elsevier/Gold Standard (2020-01-06 10:08:25)  Oxaliplatin Injection What is this medication? OXALIPLATIN (ox AL i PLA tin) is a chemotherapy drug. It targets fast dividing cells, like cancer cells, and causes these cells to die. This medicine is usedto treat cancers of the colon and rectum, and many other cancers. This medicine may be used for other purposes; ask your health care provider orpharmacist if you have questions. COMMON BRAND NAME(S): Eloxatin What should I tell my care team before I take this medication? They need to know if you have any of these conditions: heart disease history of irregular heartbeat liver disease low blood counts, like white cells, platelets, or red blood cells lung or breathing disease, like asthma take medicines that treat or prevent blood clots tingling of the fingers or toes, or other nerve disorder an unusual or allergic reaction to oxaliplatin, other chemotherapy, other medicines, foods, dyes, or preservatives pregnant or trying to get pregnant breast-feeding How should I use this medication? This drug is given as an infusion into a vein. It is administered in a hospitalor clinic by a specially trained health care professional. Talk to your pediatrician regarding the use of this medicine in children.Special care may be needed. Overdosage: If you think you have taken too much of this medicine contact apoison control center or emergency room at once. NOTE: This medicine is only for you. Do not share this medicine with  others. What if I miss a dose? It is important not to miss a dose. Call your doctor or health careprofessional if you are unable to keep an appointment. What may interact with this medication? Do not take this medicine with any of the following medications: cisapride dronedarone pimozide thioridazine This medicine may also interact with the following medications: aspirin and aspirin-like medicines certain medicines that treat or prevent blood clots like warfarin, apixaban, dabigatran, and rivaroxaban cisplatin cyclosporine diuretics medicines for infection like acyclovir, adefovir, amphotericin B, bacitracin, cidofovir, foscarnet, ganciclovir, gentamicin, pentamidine, vancomycin NSAIDs, medicines for pain and inflammation, like ibuprofen or naproxen other medicines that prolong the QT interval (an abnormal heart rhythm) pamidronate zoledronic acid This list may not describe all possible interactions. Give your health care provider a list of all the medicines, herbs, non-prescription drugs, or dietary supplements you use. Also tell them if you smoke, drink alcohol, or use illegaldrugs. Some items may interact with your medicine. What should I watch for while using this medication? Your condition will be monitored carefully while you are receiving thismedicine. You may need blood work done while you are taking this medicine. This medicine may make you feel generally unwell. This is not uncommon as chemotherapy can affect healthy cells as well as cancer cells.  Report any side effects. Continue your course of treatment even though you feel ill unless yourhealthcare professional tells you to stop. This medicine can make you more sensitive to cold. Do not drink cold drinks or use ice. Cover exposed skin before coming in contact with cold temperatures or cold objects. When out in cold weather wear warm clothing and cover your mouth and nose to warm the air that goes into your lungs. Tell your  doctor if you getsensitive to the cold. Do not become pregnant while taking this medicine or for 9 months after stopping it. Women should inform their health care professional if they wish to become pregnant or think they might be pregnant. Men should not father a child while taking this medicine and for 6 months after stopping it. There is potential for serious side effects to an unborn child. Talk to your health careprofessional for more information. Do not breast-feed a child while taking this medicine or for 3 months afterstopping it. This medicine has caused ovarian failure in some women. This medicine may make it more difficult to get pregnant. Talk to your health care professional if [place] concerned about your fertility. This medicine has caused decreased sperm counts in some men. This may make it more difficult to father a child. Talk to your health care professional if [place] concerned about your fertility. This medicine may increase your risk of getting an infection. Call your health care professional for advice if you get a fever, chills, or sore throat, or other symptoms of a cold or flu. Do not treat yourself. Try to avoid beingaround people who are sick. Avoid taking medicines that contain aspirin, acetaminophen, ibuprofen, naproxen, or ketoprofen unless instructed by your health care professional.These medicines may hide a fever. Be careful brushing or flossing your teeth or using a toothpick because you may get an infection or bleed more easily. If you have any dental work done, Primary school teacher you are receiving this medicine. What side effects may I notice from receiving this medication? Side effects that you should report to your doctor or health care professionalas soon as possible: allergic reactions like skin rash, itching or hives, swelling of the face, lips, or tongue breathing problems cough low blood counts - this medicine may decrease the number of white blood cells, red  blood cells, and platelets. You may be at increased risk for infections and bleeding nausea, vomiting pain, redness, or irritation at site where injected pain, tingling, numbness in the hands or feet signs and symptoms of bleeding such as bloody or black, tarry stools; red or dark brown urine; spitting up blood or brown material that looks like coffee grounds; red spots on the skin; unusual bruising or bleeding from the eyes, gums, or nose signs and symptoms of a dangerous change in heartbeat or heart rhythm like chest pain; dizziness; fast, irregular heartbeat; palpitations; feeling faint or lightheaded; falls signs and symptoms of infection like fever; chills; cough; sore throat; pain or trouble passing urine signs and symptoms of liver injury like dark yellow or brown urine; general ill feeling or flu-like symptoms; light-colored stools; loss of appetite; nausea; right upper belly pain; unusually weak or tired; yellowing of the eyes or skin signs and symptoms of low red blood cells or anemia such as unusually weak or tired; feeling faint or lightheaded; falls signs and symptoms of muscle injury like dark urine; trouble passing urine or change in the amount of urine; unusually weak or tired; muscle pain; back pain Side effects  that usually do not require medical attention (report to yourdoctor or health care professional if they continue or are bothersome): changes in taste diarrhea gas hair loss loss of appetite mouth sores This list may not describe all possible side effects. Call your doctor for medical advice about side effects. You may report side effects to FDA at1-800-FDA-1088. Where should I keep my medication? This drug is given in a hospital or clinic and will not be stored at home. NOTE: This sheet is a summary. It may not cover all possible information. If you have questions about this medicine, talk to your doctor, pharmacist, orhealth care provider.  2022 Elsevier/Gold Standard  (2019-01-21 12:20:35)  Leucovorin injection What is this medication? LEUCOVORIN (loo koe VOR in) is used to prevent or treat the harmful effects of some medicines. This medicine is used to treat anemia caused by a low amount of folic acid in the body. It is also used with 5-fluorouracil (5-FU) to treatcolon cancer. This medicine may be used for other purposes; ask your health care provider orpharmacist if you have questions. What should I tell my care team before I take this medication? They need to know if you have any of these conditions: anemia from low levels of vitamin B-12 in the blood an unusual or allergic reaction to leucovorin, folic acid, other medicines, foods, dyes, or preservatives pregnant or trying to get pregnant breast-feeding How should I use this medication? This medicine is for injection into a muscle or into a vein. It is given by ahealth care professional in a hospital or clinic setting. Talk to your pediatrician regarding the use of this medicine in children.Special care may be needed. Overdosage: If you think you have taken too much of this medicine contact apoison control center or emergency room at once. NOTE: This medicine is only for you. Do not share this medicine with others. What if I miss a dose? This does not apply. What may interact with this medication? capecitabine fluorouracil phenobarbital phenytoin primidone trimethoprim-sulfamethoxazole This list may not describe all possible interactions. Give your health care provider a list of all the medicines, herbs, non-prescription drugs, or dietary supplements you use. Also tell them if you smoke, drink alcohol, or use illegaldrugs. Some items may interact with your medicine. What should I watch for while using this medication? Your condition will be monitored carefully while you are receiving thismedicine. This medicine may increase the side effects of 5-fluorouracil, 5-FU. Tell your doctor or health care  professional if you have diarrhea or mouth sores that donot get better or that get worse. What side effects may I notice from receiving this medication? Side effects that you should report to your doctor or health care professionalas soon as possible: allergic reactions like skin rash, itching or hives, swelling of the face, lips, or tongue breathing problems fever, infection mouth sores unusual bleeding or bruising unusually weak or tired Side effects that usually do not require medical attention (report to yourdoctor or health care professional if they continue or are bothersome): constipation or diarrhea loss of appetite nausea, vomiting This list may not describe all possible side effects. Call your doctor for medical advice about side effects. You may report side effects to FDA at1-800-FDA-1088. Where should I keep my medication? This drug is given in a hospital or clinic and will not be stored at home. NOTE: This sheet is a summary. It may not cover all possible information. If you have questions about this medicine, talk to your doctor, pharmacist,  orhealth care provider.  2022 Elsevier/Gold Standard (2008-03-09 16:50:29)  Fluorouracil, 5-FU injection What is this medication? FLUOROURACIL, 5-FU (flure oh YOOR a sil) is a chemotherapy drug. It slows the growth of cancer cells. This medicine is used to treat many types of cancer like breast cancer, colon or rectal cancer, pancreatic cancer, and stomachcancer. This medicine may be used for other purposes; ask your health care provider orpharmacist if you have questions. COMMON BRAND NAME(S): Adrucil What should I tell my care team before I take this medication? They need to know if you have any of these conditions: blood disorders dihydropyrimidine dehydrogenase (DPD) deficiency infection (especially a virus infection such as chickenpox, cold sores, or herpes) kidney disease liver disease malnourished, poor nutrition recent or  ongoing radiation therapy an unusual or allergic reaction to fluorouracil, other chemotherapy, other medicines, foods, dyes, or preservatives pregnant or trying to get pregnant breast-feeding How should I use this medication? This drug is given as an infusion or injection into a vein. It is administeredin a hospital or clinic by a specially trained health care professional. Talk to your pediatrician regarding the use of this medicine in children.Special care may be needed. Overdosage: If you think you have taken too much of this medicine contact apoison control center or emergency room at once. NOTE: This medicine is only for you. Do not share this medicine with others. What if I miss a dose? It is important not to miss your dose. Call your doctor or health careprofessional if you are unable to keep an appointment. What may interact with this medication? Do not take this medicine with any of the following medications: live virus vaccines This medicine may also interact with the following medications: medicines that treat or prevent blood clots like warfarin, enoxaparin, and dalteparin This list may not describe all possible interactions. Give your health care provider a list of all the medicines, herbs, non-prescription drugs, or dietary supplements you use. Also tell them if you smoke, drink alcohol, or use illegaldrugs. Some items may interact with your medicine. What should I watch for while using this medication? Visit your doctor for checks on your progress. This drug may make you feel generally unwell. This is not uncommon, as chemotherapy can affect healthy cells as well as cancer cells. Report any side effects. Continue your course oftreatment even though you feel ill unless your doctor tells you to stop. In some cases, you may be given additional medicines to help with side effects.Follow all directions for their use. Call your doctor or health care professional for advice if you get a  fever, chills or sore throat, or other symptoms of a cold or flu. Do not treat yourself. This drug decreases your body's ability to fight infections. Try toavoid being around people who are sick. This medicine may increase your risk to bruise or bleed. Call your doctor orhealth care professional if you notice any unusual bleeding. Be careful brushing and flossing your teeth or using a toothpick because you may get an infection or bleed more easily. If you have any dental work done,tell your dentist you are receiving this medicine. Avoid taking products that contain aspirin, acetaminophen, ibuprofen, naproxen, or ketoprofen unless instructed by your doctor. These medicines may hide afever. Do not become pregnant while taking this medicine. Women should inform their doctor if they wish to become pregnant or think they might be pregnant. There is a potential for serious side effects to an unborn child. Talk to your health care professional or  pharmacist for more information. Do not breast-feed aninfant while taking this medicine. Men should inform their doctor if they wish to father a child. This medicinemay lower sperm counts. Do not treat diarrhea with over the counter products. Contact your doctor ifyou have diarrhea that lasts more than 2 days or if it is severe and watery. This medicine can make you more sensitive to the sun. Keep out of the sun. If you cannot avoid being in the sun, wear protective clothing and use sunscreen.Do not use sun lamps or tanning beds/booths. What side effects may I notice from receiving this medication? Side effects that you should report to your doctor or health care professionalas soon as possible: allergic reactions like skin rash, itching or hives, swelling of the face, lips, or tongue low blood counts - this medicine may decrease the number of white blood cells, red blood cells and platelets. You may be at increased risk for infections and bleeding. signs of infection  - fever or chills, cough, sore throat, pain or difficulty passing urine signs of decreased platelets or bleeding - bruising, pinpoint red spots on the skin, black, tarry stools, blood in the urine signs of decreased red blood cells - unusually weak or tired, fainting spells, lightheadedness breathing problems changes in vision chest pain mouth sores nausea and vomiting pain, swelling, redness at site where injected pain, tingling, numbness in the hands or feet redness, swelling, or sores on hands or feet stomach pain unusual bleeding Side effects that usually do not require medical attention (report to yourdoctor or health care professional if they continue or are bothersome): changes in finger or toe nails diarrhea dry or itchy skin hair loss headache loss of appetite sensitivity of eyes to the light stomach upset unusually teary eyes This list may not describe all possible side effects. Call your doctor for medical advice about side effects. You may report side effects to FDA at1-800-FDA-1088. Where should I keep my medication? This drug is given in a hospital or clinic and will not be stored at home. NOTE: This sheet is a summary. It may not cover all possible information. If you have questions about this medicine, talk to your doctor, pharmacist, orhealth care provider.  2022 Elsevier/Gold Standard (2019-08-04 15:00:03)

## 2021-04-26 NOTE — Patient Instructions (Signed)
Implanted Port Home Guide An implanted port is a device that is placed under the skin. It is usually placed in the chest. The device can be used to give IV medicine, to take blood, or for dialysis. You may have an implanted port if: You need IV medicine that would be irritating to the small veins in your hands or arms. You need IV medicines, such as antibiotics, for a long period of time. You need IV nutrition for a long period of time. You need dialysis. When you have a port, your health care provider can choose to use the port instead of veins in your arms for these procedures. You may have fewer limitations when using a port than you would if you used other types of long-term IVs, and you will likely be able to return to normal activities afteryour incision heals. An implanted port has two main parts: Reservoir. The reservoir is the part where a needle is inserted to give medicines or draw blood. The reservoir is round. After it is placed, it appears as a small, raised area under your skin. Catheter. The catheter is a thin, flexible tube that connects the reservoir to a vein. Medicine that is inserted into the reservoir goes into the catheter and then into the vein. How is my port accessed? To access your port: A numbing cream may be placed on the skin over the port site. Your health care provider will put on a mask and sterile gloves. The skin over your port will be cleaned carefully with a germ-killing soap and allowed to dry. Your health care provider will gently pinch the port and insert a needle into it. Your health care provider will check for a blood return to make sure the port is in the vein and is not clogged. If your port needs to remain accessed to get medicine continuously (constant infusion), your health care provider will place a clear bandage (dressing) over the needle site. The dressing and needle will need to be changed every week, or as told by your health care provider. What  is flushing? Flushing helps keep the port from getting clogged. Follow instructions from your health care provider about how and when to flush the port. Ports are usually flushed with saline solution or a medicine called heparin. The need for flushing will depend on how the port is used: If the port is only used from time to time to give medicines or draw blood, the port may need to be flushed: Before and after medicines have been given. Before and after blood has been drawn. As part of routine maintenance. Flushing may be recommended every 4-6 weeks. If a constant infusion is running, the port may not need to be flushed. Throw away any syringes in a disposal container that is meant for sharp items (sharps container). You can buy a sharps container from a pharmacy, or you can make one by using an empty hard plastic bottle with a cover. How long will my port stay implanted? The port can stay in for as long as your health care provider thinks it is needed. When it is time for the port to come out, a surgery will be done to remove it. The surgery will be similar to the procedure that was done to putthe port in. Follow these instructions at home:  Flush your port as told by your health care provider. If you need an infusion over several days, follow instructions from your health care provider about how to take   care of your port site. Make sure you: Wash your hands with soap and water before you change your dressing. If soap and water are not available, use alcohol-based hand sanitizer. Change your dressing as told by your health care provider. Place any used dressings or infusion bags into a plastic bag. Throw that bag in the trash. Keep the dressing that covers the needle clean and dry. Do not get it wet. Do not use scissors or sharp objects near the tube. Keep the tube clamped, unless it is being used. Check your port site every day for signs of infection. Check for: Redness, swelling, or  pain. Fluid or blood. Pus or a bad smell. Protect the skin around the port site. Avoid wearing bra straps that rub or irritate the site. Protect the skin around your port from seat belts. Place a soft pad over your chest if needed. Bathe or shower as told by your health care provider. The site may get wet as long as you are not actively receiving an infusion. Return to your normal activities as told by your health care provider. Ask your health care provider what activities are safe for you. Carry a medical alert card or wear a medical alert bracelet at all times. This will let health care providers know that you have an implanted port in case of an emergency. Get help right away if: You have redness, swelling, or pain at the port site. You have fluid or blood coming from your port site. You have pus or a bad smell coming from the port site. You have a fever. Summary Implanted ports are usually placed in the chest for long-term IV access. Follow instructions from your health care provider about flushing the port and changing bandages (dressings). Take care of the area around your port by avoiding clothing that puts pressure on the area, and by watching for signs of infection. Protect the skin around your port from seat belts. Place a soft pad over your chest if needed. Get help right away if you have a fever or you have redness, swelling, pain, drainage, or a bad smell at the port site. This information is not intended to replace advice given to you by your health care provider. Make sure you discuss any questions you have with your healthcare provider. Document Revised: 01/18/2020 Document Reviewed: 01/18/2020 Elsevier Patient Education  2022 Elsevier Inc.  

## 2021-04-26 NOTE — Progress Notes (Signed)
[facility] [phone]   [phone]  PROGRESS NOTE  Patient Care Team: Carol Ada, MD as PCP - General (Family Medicine) Evans Lance, MD as PCP - Cardiology (Cardiology) Orson Slick, MD as Consulting Physician (Hematology and Oncology)  Hematological/Oncological History # Metastatic Adenocarcinoma of the Distal Esophagus 01/27/2021: dermatology performed a punch biopsy of the right superior lateral anterior neck. Finding show adenocarcinoma negative for PSA and cytokeratin 20.  02/06/2021: establish care with Dr. Lorenso Courier  02/15/2021: EGD found esophageal mass. Biopsy confirms poorly differentiated carcinoma 02/22/2021: PET CT scan shows FDG avid mass in distal esophagus and subcutaneous tissue or right neck.  03/22/2021-04/05/2021: palliative radiation to the esophageal mass.  04/26/2021: Cycle 1 Day 1 of FOLFOX + Nivolumab  Interval History:  Justin Trujillo 75 y.o. male with medical history significant for metastatic esophageal adenocarcinoma who presents for a follow up visit. The patient's last visit was on 04/09/2021. In the interim since the last visit he has completed palliative radiation to the mass.  On exam today Justin Trujillo is accompanied by his wife.  He has had remarkably quick response to the radiation therapy to esophagus.  He is now tolerating p.o. therapy, including pizza yesterday.  He notes that there is still occasional sticking of the food if he does not chew his food properly or eats too quickly.  He notes that he is delighted to now be able to enjoy [place] dip, doubled eggs, and other solid foods.  His appetite is rapidly coming back and his weight is stabilizing.  He otherwise has not been having any issues with fevers, chills, sweats, nausea, vomiting or diarrhea.  A full 10 point ROS is listed below.  The bulk of our discussion today focused on assuring he had all the medications necessary to start cycle 1 day one of his  chemotherapy.  MEDICAL HISTORY:  Past Medical History:  Diagnosis Date   Arthritis    Coronary atherosclerosis of native coronary artery    Depression    Essential tremor    High cholesterol    Hypertension    Hypertension    Hypogonadism male    Obstructive chronic bronchitis with exacerbation (HCC)    Obstructive sleep apnea (adult) (pediatric)    no cpap   Other and unspecified hyperlipidemia    Other emphysema ([place])    Proteinuria 2019   has seen a nephrologist   Shortness of breath    with excertion   Situational stress    Type II or unspecified type diabetes mellitus without mention of complication, not stated as uncontrolled    type 2   Unspecified essential hypertension     SURGICAL HISTORY: Past Surgical History:  Procedure Laterality Date   CARDIAC CATHETERIZATION     IR IMAGING GUIDED PORT INSERTION  04/24/2021   IR KYPHO LUMBAR INC FX REDUCE BONE BX UNI/BIL CANNULATION INC/IMAGING  06/13/2018   L lens implant     R knee replacement x2     REVERSE SHOULDER ARTHROPLASTY Left 03/26/2019   Procedure: REVERSE SHOULDER ARTHROPLASTY;  Surgeon: Justice Britain, MD;  Location: [facility];  Service: Orthopedics;  Laterality: Left;  158mn   TONSILLECTOMY     TONSILLECTOMY AND ADENOIDECTOMY     vascectomy      SOCIAL HISTORY: Social History   Socioeconomic History   Marital status: Married    Spouse name: Not on file   Number of children: Not on file   Years of education: Not  on file   Highest education level: Not on file  Occupational History   Occupation: Landscape architect  Tobacco Use   Smoking status: Every Day    Packs/day: 1.00    Years: 55.00    Pack years: 55.00    Types: Cigarettes   Smokeless tobacco: Never  Vaping Use   Vaping Use: Never used  Substance and Sexual Activity   Alcohol use: Yes    Alcohol/week: 1.0 standard drink    Types: 1 Cans of beer per week    Comment: occasional beer   Drug use: Never   Sexual activity: Not  Currently  Other Topics Concern   Not on file  Social History Narrative   Not on file   Social Determinants of Health   Financial Resource Strain: Not on file  Food Insecurity: Not on file  Transportation Needs: Not on file  Physical Activity: Not on file  Stress: Not on file  Social Connections: Not on file  Intimate Partner Violence: Not on file    FAMILY HISTORY: Family History  Problem Relation Age of Onset   Emphysema Father    Heart disease Father    Clotting disorder Mother    Kidney cancer Child     ALLERGIES:  is allergic to iodinated diagnostic agents, moxifloxacin, penicillins, shellfish-derived products, amoxicillin, other, and latex.  MEDICATIONS:  Current Outpatient Medications  Medication Sig Dispense Refill   acetaminophen (TYLENOL) 500 MG tablet Take 650 mg by mouth every 8 (eight) hours as needed (pain).     ALPRAZolam (XANAX) 0.5 MG tablet Take 0.5 mg by mouth 2 (two) times daily.     aspirin EC 81 MG tablet Take 81 mg by mouth daily. (Patient not taking: Reported on 04/26/2021)     atorvastatin (LIPITOR) 40 MG tablet Take 1 tablet (40 mg total) by mouth daily. 30 tablet 0   Calcium Carb-Cholecalciferol (CALCIUM 600+D3 PO) Take 1 tablet by mouth daily. (Patient not taking: Reported on 03/07/2021)     calcium citrate-vitamin D (CITRACAL+D) 315-200 MG-UNIT tablet daily in the afternoon. (Patient not taking: Reported on 03/07/2021)     Cholecalciferol (VITAMIN D) 50 MCG (2000 UT) tablet Take 2,000 Units by mouth daily.     escitalopram (LEXAPRO) 10 MG tablet Take 10 mg by mouth daily.     gabapentin (NEURONTIN) 300 MG capsule Take 2 capsules (600 mg total) by mouth 3 (three) times daily. (Patient taking differently: Take 300 mg by mouth 3 (three) times daily.) 90 capsule 5   lidocaine-prilocaine (EMLA) cream Apply 1 application topically as needed. 30 g 0   lisinopril-hydrochlorothiazide (ZESTORETIC) 20-12.5 MG tablet Take 1 tablet by mouth daily. 12.5 mg only      metFORMIN (GLUCOPHAGE) 1000 MG tablet Take 1 tablet (1,000 mg total) by mouth 2 (two) times daily with a meal. (Patient taking differently: Take 1,000 mg by mouth at bedtime.) 60 tablet 0   Multiple Vitamins-Minerals (CENTRUM SILVER) tablet daily in the afternoon.     MYRBETRIQ 50 MG TB24 tablet Take 50 mg by mouth daily.     ondansetron (ZOFRAN) 8 MG tablet Take 1 tablet (8 mg total) by mouth every 8 (eight) hours as needed for nausea or vomiting. 20 tablet 0   prochlorperazine (COMPAZINE) 10 MG tablet Take 1 tablet (10 mg total) by mouth every 6 (six) hours as needed for nausea or vomiting. 30 tablet 0   rOPINIRole (REQUIP) 0.5 MG tablet Take 0.5 mg by mouth at bedtime.  Semaglutide (OZEMPIC, 1 MG/DOSE, [place]) Inject 1 each into the skin once a week.     tamsulosin (FLOMAX) 0.4 MG CAPS capsule Take 0.4 mg by mouth daily.     testosterone cypionate (DEPOTESTOSTERONE CYPIONATE) 200 MG/ML injection Inject 200 mg into the muscle every 14 (fourteen) days. Every 2 weeks     tiZANidine (ZANAFLEX) 4 MG tablet SMARTSIG:1 Tablet(s) By Mouth 1 to 3 Times Daily PRN     trolamine salicylate (ASPERCREME) 10 % cream Apply 1 application topically 2 (two) times daily as needed for muscle pain.      No current facility-administered medications for this visit.    REVIEW OF SYSTEMS:   Constitutional: ( - ) fevers, ( - )  chills , ( - ) night sweats Eyes: ( - ) blurriness of vision, ( - ) double vision, ( - ) watery eyes Ears, nose, mouth, throat, and face: ( - ) mucositis, ( - ) sore throat Respiratory: ( - ) cough, ( - ) dyspnea, ( - ) wheezes Cardiovascular: ( - ) palpitation, ( - ) chest discomfort, ( - ) lower extremity swelling Gastrointestinal:  ( - ) nausea, ( - ) heartburn, ( - ) change in bowel habits Skin: ( - ) abnormal skin rashes Lymphatics: ( - ) new lymphadenopathy, ( - ) easy bruising Neurological: ( - ) numbness, ( - ) tingling, ( - ) new weaknesses Behavioral/Psych: ( - ) mood change, ( -  ) new changes  All other systems were reviewed with the patient and are negative.  PHYSICAL EXAMINATION: ECOG PERFORMANCE STATUS: 1 - Symptomatic but completely ambulatory  Vitals:   04/26/21 1125  BP: 100/72  Pulse: 93  Resp: 18  Temp: (!) 97.4 F (36.3 C)  SpO2: 94%   Filed Weights   04/26/21 1125  Weight: 189 lb (85.7 kg)    GENERAL: Well-appearing elderly Caucasian male, alert, no distress and comfortable SKIN: skin color, texture, turgor are normal, no rashes or significant lesions EYES: conjunctiva are pink and non-injected, sclera clear LUNGS: clear to auscultation and percussion with normal breathing effort HEART: regular rate & rhythm and no murmurs and no lower extremity edema PSYCH: alert & oriented x 3, fluent speech NEURO: no focal motor/sensory deficits  LABORATORY DATA:  I have reviewed the data as listed CBC Latest Ref Rng & Units 04/26/2021 03/30/2021 03/03/2021  WBC 4.0 - 10.5 K/uL 9.4 8.4 9.4  Hemoglobin 13.0 - 17.0 g/dL 12.9(L) 14.4 15.4  Hematocrit 39.0 - 52.0 % 39.7 43.9 47.9  Platelets 150 - 400 K/uL 195 194 258    CMP Latest Ref Rng & Units 04/26/2021 03/30/2021 03/03/2021  Glucose 70 - 99 mg/dL 62(L) 197(H) 86  BUN 8 - 23 mg/dL _0 Creatinine 0.61 - 1.24 mg/dL 0.73 0.86 1.02  Sodium 135 - 145 mmol/L 134(L) 136 134(L)  Potassium 3.5 - 5.1 mmol/L 3.8 3.8 4.1  Chloride 98 - 111 mmol/L 99 99 97(L)  CO2 22 - 32 mmol/L _1 Calcium 8.9 - 10.3 mg/dL 9.7 9.7 9.6  Total Protein 6.5 - 8.1 g/dL 6.6 6.8 7.1  Total Bilirubin 0.3 - 1.2 mg/dL 0.4 0.4 0.7  Alkaline Phos 38 - 126 U/L 81 85 80  AST 15 - 41 U/L 16 13(L) 19  ALT 0 - 44 U/L _2 RADIOGRAPHIC STUDIES: I have personally reviewed the radiological images as listed and agreed with the findings in the report: FDG avid distal esophagus  as well as subcutaneous none lymph node region in the right neck. IR IMAGING GUIDED PORT INSERTION  Result Date: 04/24/2021 INDICATION: 75 year old male  chronic central venous access for chemotherapy. EXAM: IMPLANTED PORT A CATH PLACEMENT WITH ULTRASOUND AND FLUOROSCOPIC GUIDANCE COMPARISON:  None. MEDICATIONS: None. ANESTHESIA/SEDATION: Moderate (conscious) sedation was employed during this procedure. A total of Versed 2 mg and Fentanyl 100 mcg was administered intravenously. Moderate Sedation Time: 14 minutes. The patient's level of consciousness and vital signs were monitored continuously by radiology nursing throughout the procedure under my direct supervision. CONTRAST:  None FLUOROSCOPY TIME:  0 minutes, 12 seconds (3 mGy) COMPLICATIONS: None immediate. PROCEDURE: The procedure, risks, benefits, and alternatives were explained to the patient. Questions regarding the procedure were encouraged and answered. The patient understands and consents to the procedure. The right neck and chest were prepped with chlorhexidine in a sterile fashion, and a sterile drape was applied covering the operative field. Maximum barrier sterile technique with sterile gowns and gloves were used for the procedure. A timeout was performed prior to the initiation of the procedure. Ultrasound was used to examine the jugular vein which was compressible and free of internal echoes. A skin marker was used to demarcate the planned venotomy and port pocket incision sites. Local anesthesia was provided to these sites and the subcutaneous tunnel track with 1% lidocaine with 1:100,000 epinephrine. A small incision was created at the jugular access site and blunt dissection was performed of the subcutaneous tissues. Under ultrasound guidance, the jugular vein was accessed with a 21 ga micropuncture needle and an 0.018" wire was inserted to the superior vena cava. Real-time ultrasound guidance was utilized for vascular access including the acquisition of a permanent ultrasound image documenting patency of the accessed vessel. A 5 Fr micopuncture set was then used, through which a 0.035" Rosen wire  was passed under fluoroscopic guidance into the inferior vena cava. An 8 Fr dilator was then placed over the wire. A subcutaneous port pocket was then created along the upper chest wall utilizing a combination of sharp and blunt dissection. The pocket was irrigated with sterile saline, packed with gauze, and observed for hemorrhage. A single lumen "ISP" sized power injectable port was chosen for placement. The 8 Fr catheter was tunneled from the port pocket site to the venotomy incision. The port was placed in the pocket. The external catheter was trimmed to appropriate length. The dilator was exchanged for an 8 Fr peel-away sheath under fluoroscopic guidance. The catheter was then placed through the sheath and the sheath was removed. Final catheter positioning was confirmed and documented with a fluoroscopic spot radiograph. The port was accessed with a Huber needle, aspirated, and flushed with heparinized saline. The deep dermal layer of the port pocket incision was closed with interrupted 3-0 Vicryl suture. The skin was opposed with a running subcuticular 4-0 Monocryl suture. Dermabond was then placed over the port pocket and neck incisions. The patient tolerated the procedure well without immediate post procedural complication. FINDINGS: After catheter placement, the tip lies within the superior cavoatrial junction. The catheter aspirates and flushes normally and is ready for immediate use. IMPRESSION: Successful placement of a power injectable Port-A-Cath via the right internal jugular vein. The catheter is ready for immediate use. Ruthann Cancer, MD [facility] Electronically Signed   By: Ruthann Cancer MD   On: 04/24/2021 17:18    ASSESSMENT & PLAN Justin Trujillo 75 y.o. male with medical history significant for metastatic  esophageal adenocarcinoma who presents for a follow up visit.   At this time Mr. Oleski findings are most consistent with a  metastatic adenocarcinoma of the esophagus.  Given his symptomatic dysphagia and inability to eat solid food at this time we recommend palliative radiation to his esophagus in order to help alleviate this.  Once the patient is able to tolerate food and is steady and his weight we will consider the addition of systemic chemotherapy.  The patient is negative for HER2 but has a high TPS score.  Therefore he would be an excellent candidate for FOLFOX with nivolumab.  The patient is wife voiced understanding of this plan moving forward.  We will set the patient meet with nutrition in order to help better administer calories.  #Metastatic Adenocarcinoma of the Esophagus #Dysphagia -- Underwent palliative radiation in order to help alleviate his dysphagia. Patient now tolerating PO well.  --Unfortunately given the spread of this cancer to the skin we will need to treat this as metastatic disease with systemic therapy  -- Final testing on the tissue resulted with HER2 negative status, but TPS score of 60%.  Therefore he would be a good candidate for FOLFOX with nivolumab Plan: -- Cycle 1 Day 1 of FOLFOX + Nivolumab today -- RTC in 2 weeks for Cycle 2  #Supportive Care -- chemotherapy education complete -- port placed -- zofran 8m q8H PRN and compazine 170mPO q6H for nausea -- EMLA cream for port -- no pain medication required at this time.  -- HOLD on placing PEG tube as he is currently tolerating PO remarkably well.   No orders of the defined types were placed in this encounter.  All questions were answered. The patient knows to call the clinic with any problems, questions or concerns.  A total of more than 30 minutes were spent on this encounter with face-to-face time and non-face-to-face time, including preparing to see the patient, ordering tests and/or medications, counseling the patient and coordination of care as outlined above.   JoLedell PeoplesMD Department of Hematology/Oncology [facility] Centerhone: [phone]: [phone]: [email or web address]  04/26/2021 11:59 AM

## 2021-04-26 NOTE — Progress Notes (Signed)
Met with patient/accompanying adult at registration to introduce myself as Financial Resource Specialist and to offer available resources.  Discussed one-time $1000 [employer] grant and qualifications to assist with personal expenses while going through treatment.  Gave him my card if interested in applying and for any additional financial questions or concerns. 

## 2021-04-28 ENCOUNTER — Other Ambulatory Visit: Payer: Self-pay

## 2021-04-28 ENCOUNTER — Inpatient Hospital Stay: Payer: PPO

## 2021-04-28 VITALS — BP 90/58 | HR 85 | Temp 99.0°F | Resp 20

## 2021-04-28 DIAGNOSIS — C155 Malignant neoplasm of lower third of esophagus: Secondary | ICD-10-CM

## 2021-04-28 MED ORDER — HEPARIN SOD (PORK) LOCK FLUSH 100 UNIT/ML IV SOLN
500.0000 [IU] | Freq: Once | INTRAVENOUS | Status: DC | PRN
  Filled 2021-04-28: qty 5

## 2021-04-28 MED ORDER — SODIUM CHLORIDE 0.9% FLUSH
10.0000 mL | INTRAVENOUS | Status: DC | PRN
  Filled 2021-04-28: qty 10

## 2021-04-28 NOTE — Progress Notes (Signed)
Patient came in for pump d/c and B/P was 90/58. MD Lorenso Courier assessed patient and encouraged po fluids. Pt expressed concerns with taking BP meds due to having low bp. MD stated pt can stop BP med if needed.

## 2021-05-02 ENCOUNTER — Other Ambulatory Visit (HOSPITAL_COMMUNITY): Payer: PPO

## 2021-05-02 ENCOUNTER — Ambulatory Visit (HOSPITAL_COMMUNITY): Payer: PPO

## 2021-05-02 DIAGNOSIS — S92345D Nondisplaced fracture of fourth metatarsal bone, left foot, subsequent encounter for fracture with routine healing: Secondary | ICD-10-CM | POA: Diagnosis not present

## 2021-05-02 DIAGNOSIS — S92355D Nondisplaced fracture of fifth metatarsal bone, left foot, subsequent encounter for fracture with routine healing: Secondary | ICD-10-CM | POA: Diagnosis not present

## 2021-05-08 DIAGNOSIS — M5416 Radiculopathy, lumbar region: Secondary | ICD-10-CM | POA: Diagnosis not present

## 2021-05-09 ENCOUNTER — Other Ambulatory Visit: Payer: Self-pay | Admitting: Physician Assistant

## 2021-05-09 ENCOUNTER — Ambulatory Visit: Payer: PPO | Admitting: Physician Assistant

## 2021-05-09 ENCOUNTER — Other Ambulatory Visit: Payer: PPO

## 2021-05-09 ENCOUNTER — Ambulatory Visit: Payer: PPO

## 2021-05-09 DIAGNOSIS — C155 Malignant neoplasm of lower third of esophagus: Secondary | ICD-10-CM

## 2021-05-10 ENCOUNTER — Inpatient Hospital Stay: Payer: PPO

## 2021-05-10 ENCOUNTER — Other Ambulatory Visit: Payer: Self-pay

## 2021-05-10 ENCOUNTER — Other Ambulatory Visit: Payer: Self-pay | Admitting: Hematology and Oncology

## 2021-05-10 ENCOUNTER — Inpatient Hospital Stay (HOSPITAL_BASED_OUTPATIENT_CLINIC_OR_DEPARTMENT_OTHER): Payer: PPO | Admitting: Physician Assistant

## 2021-05-10 VITALS — BP 99/60 | HR 81 | Temp 98.1°F | Resp 17 | Ht 67.0 in | Wt 183.9 lb

## 2021-05-10 DIAGNOSIS — C792 Secondary malignant neoplasm of skin: Secondary | ICD-10-CM

## 2021-05-10 DIAGNOSIS — C155 Malignant neoplasm of lower third of esophagus: Secondary | ICD-10-CM

## 2021-05-10 DIAGNOSIS — C153 Malignant neoplasm of upper third of esophagus: Secondary | ICD-10-CM | POA: Diagnosis not present

## 2021-05-10 DIAGNOSIS — Z95828 Presence of other vascular implants and grafts: Secondary | ICD-10-CM

## 2021-05-10 DIAGNOSIS — Z5111 Encounter for antineoplastic chemotherapy: Secondary | ICD-10-CM | POA: Diagnosis not present

## 2021-05-10 LAB — CBC WITH DIFFERENTIAL (CANCER CENTER ONLY)
Abs Immature Granulocytes: 0.03 10*3/uL (ref 0.00–0.07)
Basophils Absolute: 0 10*3/uL (ref 0.0–0.1)
Basophils Relative: 0 %
Eosinophils Absolute: 0.2 10*3/uL (ref 0.0–0.5)
Eosinophils Relative: 3 %
HCT: 36.8 % — ABNORMAL LOW (ref 39.0–52.0)
Hemoglobin: 12.3 g/dL — ABNORMAL LOW (ref 13.0–17.0)
Immature Granulocytes: 0 %
Lymphocytes Relative: 6 %
Lymphs Abs: 0.5 10*3/uL — ABNORMAL LOW (ref 0.7–4.0)
MCH: 29.1 pg (ref 26.0–34.0)
MCHC: 33.4 g/dL (ref 30.0–36.0)
MCV: 87 fL (ref 80.0–100.0)
Monocytes Absolute: 0.6 10*3/uL (ref 0.1–1.0)
Monocytes Relative: 8 %
Neutro Abs: 6.5 10*3/uL (ref 1.7–7.7)
Neutrophils Relative %: 83 %
Platelet Count: 176 10*3/uL (ref 150–400)
RBC: 4.23 MIL/uL (ref 4.22–5.81)
RDW: 17.3 % — ABNORMAL HIGH (ref 11.5–15.5)
WBC Count: 7.8 10*3/uL (ref 4.0–10.5)
nRBC: 0 % (ref 0.0–0.2)

## 2021-05-10 LAB — CMP (CANCER CENTER ONLY)
ALT: 17 U/L (ref 0–44)
AST: 20 U/L (ref 15–41)
Albumin: 3.5 g/dL (ref 3.5–5.0)
Alkaline Phosphatase: 91 U/L (ref 38–126)
Anion gap: 9 (ref 5–15)
BUN: 14 mg/dL (ref 8–23)
CO2: 28 mmol/L (ref 22–32)
Calcium: 9.5 mg/dL (ref 8.9–10.3)
Chloride: 94 mmol/L — ABNORMAL LOW (ref 98–111)
Creatinine: 0.81 mg/dL (ref 0.61–1.24)
GFR, Estimated: >60 mL/min (ref >60)
Glucose, Bld: 111 mg/dL — ABNORMAL HIGH (ref 70–99)
Potassium: 3.5 mmol/L (ref 3.5–5.1)
Sodium: 131 mmol/L — ABNORMAL LOW (ref 135–145)
Total Bilirubin: 0.4 mg/dL (ref 0.3–1.2)
Total Protein: 6.8 g/dL (ref 6.5–8.1)

## 2021-05-10 LAB — LACTATE DEHYDROGENASE: LDH: 165 U/L (ref 98–192)

## 2021-05-10 LAB — TSH: TSH: 0.691 u[IU]/mL (ref 0.320–4.118)

## 2021-05-10 MED ORDER — SODIUM CHLORIDE 0.9% FLUSH: 10.0000 mL | INTRAVENOUS | Status: DC | PRN

## 2021-05-10 MED ORDER — SODIUM CHLORIDE 0.9 % IV SOLN
2475.0000 mg/m2 | INTRAVENOUS | Status: DC
  Administered 2021-05-10: 5000 mg via INTRAVENOUS
  Filled 2021-05-10: qty 100

## 2021-05-10 MED ORDER — LEUCOVORIN CALCIUM INJECTION 350 MG
400.0000 mg/m2 | Freq: Once | INTRAVENOUS | Status: AC
  Administered 2021-05-10: 808 mg via INTRAVENOUS
  Filled 2021-05-10: qty 40.4

## 2021-05-10 MED ORDER — SODIUM CHLORIDE 0.9% FLUSH
10.0000 mL | Freq: Once | INTRAVENOUS | Status: AC
  Administered 2021-05-10: 10 mL via INTRAVENOUS

## 2021-05-10 MED ORDER — SODIUM CHLORIDE 0.9 % IV SOLN
10.0000 mg | Freq: Once | INTRAVENOUS | Status: AC
  Administered 2021-05-10: 10 mg via INTRAVENOUS
  Filled 2021-05-10: qty 10

## 2021-05-10 MED ORDER — FLUOROURACIL CHEMO INJECTION 2.5 GM/50ML
400.0000 mg/m2 | Freq: Once | INTRAVENOUS | Status: AC
  Administered 2021-05-10: 800 mg via INTRAVENOUS
  Filled 2021-05-10: qty 16

## 2021-05-10 MED ORDER — SODIUM CHLORIDE 0.9 % IV SOLN
240.0000 mg | Freq: Once | INTRAVENOUS | Status: AC
  Administered 2021-05-10: 240 mg via INTRAVENOUS
  Filled 2021-05-10: qty 24

## 2021-05-10 MED ORDER — DEXTROSE 5 % IV SOLN: Freq: Once | INTRAVENOUS | Status: AC

## 2021-05-10 MED ORDER — OXALIPLATIN CHEMO INJECTION 100 MG/20ML
85.0000 mg/m2 | Freq: Once | INTRAVENOUS | Status: AC
  Administered 2021-05-10: 170 mg via INTRAVENOUS
  Filled 2021-05-10: qty 34

## 2021-05-10 MED ORDER — HEPARIN SOD (PORK) LOCK FLUSH 100 UNIT/ML IV SOLN: 500.0000 [IU] | Freq: Once | INTRAVENOUS | Status: DC | PRN

## 2021-05-10 MED ORDER — PALONOSETRON HCL INJECTION 0.25 MG/5ML
0.2500 mg | Freq: Once | INTRAVENOUS | Status: AC
  Administered 2021-05-10: 0.25 mg via INTRAVENOUS
  Filled 2021-05-10: qty 5

## 2021-05-10 NOTE — Progress Notes (Signed)
[facility] [phone]   [phone]  PROGRESS NOTE  Patient Care Team: Carol Ada, MD as PCP - General (Family Medicine) Evans Lance, MD as PCP - Cardiology (Cardiology) Orson Slick, MD as Consulting Physician (Hematology and Oncology)  Hematological/Oncological History # Metastatic Adenocarcinoma of the Distal Esophagus 01/27/2021: dermatology performed a punch biopsy of the right superior lateral anterior neck. Finding show adenocarcinoma negative for PSA and cytokeratin 20.  02/06/2021: establish care with Dr. Lorenso Courier  02/15/2021: EGD found esophageal mass. Biopsy confirms poorly differentiated carcinoma 02/22/2021: PET CT scan shows FDG avid mass in distal esophagus and subcutaneous tissue or right neck.  03/22/2021-04/05/2021: palliative radiation to the esophageal mass.  04/26/2021: Cycle 1 Day 1 of FOLFOX + Nivolumab 05/10/2021: Cycle 2 Day 1 of FOLFOX + Nivolumab  Interval History:  Justin Trujillo 75 y.o. male with medical history significant for metastatic esophageal adenocarcinoma who presents for a follow up visit. The patient's last visit was on 04/26/2021. In the interim since the last visit he has completed Cycle 1 of FOLFOX plus Nivolumab.   On exam today Justin Trujillo is unaccompanied for this visit. He reports overall stable energy levels. He has fatigue and naps 2-3 hours per day. He has a good appetite but has episodes of dysphagia mainly with solid foods. He denies regurgitation or odynophagia. He denies nausea, vomiting or abdominal pain. He is taking antiemetics certain days to prevent nausea. His bowel movements are unchanged. He takes miralax as needed when he develops constipation. He experienced cold sensitivity after treatment but denies any residual neuropathy. He denies easy bruising or signs of bleeding. He experienced one episode of chills without fevers. Patient notes a rash near his port site that he feels is secondary to the  adhesive tape that was applied. He reports that rash is improving and not pruritic at this time. He denies any shortness of breath ,chest pain or cough. He has no other complaints.   MEDICAL HISTORY:  Past Medical History:  Diagnosis Date   Arthritis    Coronary atherosclerosis of native coronary artery    Depression    Essential tremor    High cholesterol    Hypertension    Hypertension    Hypogonadism male    Obstructive chronic bronchitis with exacerbation (HCC)    Obstructive sleep apnea (adult) (pediatric)    no cpap   Other and unspecified hyperlipidemia    Other emphysema ([place])    Proteinuria 2019   has seen a nephrologist   Shortness of breath    with excertion   Situational stress    Type II or unspecified type diabetes mellitus without mention of complication, not stated as uncontrolled    type 2   Unspecified essential hypertension     SURGICAL HISTORY: Past Surgical History:  Procedure Laterality Date   CARDIAC CATHETERIZATION     IR IMAGING GUIDED PORT INSERTION  04/24/2021   IR KYPHO LUMBAR INC FX REDUCE BONE BX UNI/BIL CANNULATION INC/IMAGING  06/13/2018   L lens implant     R knee replacement x2     REVERSE SHOULDER ARTHROPLASTY Left 03/26/2019   Procedure: REVERSE SHOULDER ARTHROPLASTY;  Surgeon: Justice Britain, MD;  Location: [facility];  Service: Orthopedics;  Laterality: Left;  186mn   TONSILLECTOMY     TONSILLECTOMY AND ADENOIDECTOMY     vascectomy      SOCIAL HISTORY: Social History   Socioeconomic History   Marital status: Married  Spouse name: Not on file   Number of children: Not on file   Years of education: Not on file   Highest education level: Not on file  Occupational History   Occupation: Landscape architect  Tobacco Use   Smoking status: Every Day    Packs/day: 1.00    Years: 55.00    Pack years: 55.00    Types: Cigarettes   Smokeless tobacco: Never  Vaping Use   Vaping Use: Never used  Substance and Sexual Activity    Alcohol use: Yes    Alcohol/week: 1.0 standard drink    Types: 1 Cans of beer per week    Comment: occasional beer   Drug use: Never   Sexual activity: Not Currently  Other Topics Concern   Not on file  Social History Narrative   Not on file   Social Determinants of Health   Financial Resource Strain: Not on file  Food Insecurity: Not on file  Transportation Needs: Not on file  Physical Activity: Not on file  Stress: Not on file  Social Connections: Not on file  Intimate Partner Violence: Not on file    FAMILY HISTORY: Family History  Problem Relation Age of Onset   Emphysema Father    Heart disease Father    Clotting disorder Mother    Kidney cancer Child     ALLERGIES:  is allergic to iodinated diagnostic agents, moxifloxacin, penicillins, shellfish-derived products, amoxicillin, other, and latex.  MEDICATIONS:  Current Outpatient Medications  Medication Sig Dispense Refill   acetaminophen (TYLENOL) 500 MG tablet Take 650 mg by mouth every 8 (eight) hours as needed (pain).     ALPRAZolam (XANAX) 0.5 MG tablet Take 0.5 mg by mouth 2 (two) times daily.     aspirin EC 81 MG tablet Take 81 mg by mouth daily. (Patient not taking: Reported on 04/26/2021)     atorvastatin (LIPITOR) 40 MG tablet Take 1 tablet (40 mg total) by mouth daily. 30 tablet 0   Calcium Carb-Cholecalciferol (CALCIUM 600+D3 PO) Take 1 tablet by mouth daily. (Patient not taking: Reported on 03/07/2021)     calcium citrate-vitamin D (CITRACAL+D) 315-200 MG-UNIT tablet daily in the afternoon. (Patient not taking: Reported on 03/07/2021)     Cholecalciferol (VITAMIN D) 50 MCG (2000 UT) tablet Take 2,000 Units by mouth daily.     escitalopram (LEXAPRO) 10 MG tablet Take 10 mg by mouth daily.     gabapentin (NEURONTIN) 300 MG capsule Take 2 capsules (600 mg total) by mouth 3 (three) times daily. (Patient taking differently: Take 300 mg by mouth 3 (three) times daily.) 90 capsule 5   lidocaine-prilocaine (EMLA)  cream Apply 1 application topically as needed. 30 g 0   lisinopril-hydrochlorothiazide (ZESTORETIC) 20-12.5 MG tablet Take 1 tablet by mouth daily. 12.5 mg only     metFORMIN (GLUCOPHAGE) 1000 MG tablet Take 1 tablet (1,000 mg total) by mouth 2 (two) times daily with a meal. (Patient taking differently: Take 1,000 mg by mouth at bedtime.) 60 tablet 0   Multiple Vitamins-Minerals (CENTRUM SILVER) tablet daily in the afternoon.     MYRBETRIQ 50 MG TB24 tablet Take 50 mg by mouth daily.     ondansetron (ZOFRAN) 8 MG tablet Take 1 tablet (8 mg total) by mouth every 8 (eight) hours as needed for nausea or vomiting. 20 tablet 0   prochlorperazine (COMPAZINE) 10 MG tablet Take 1 tablet (10 mg total) by mouth every 6 (six) hours as needed for nausea or vomiting. 30 tablet  0   rOPINIRole (REQUIP) 0.5 MG tablet Take 0.5 mg by mouth at bedtime.     Semaglutide (OZEMPIC, 1 MG/DOSE, [place]) Inject 1 each into the skin once a week.     tamsulosin (FLOMAX) 0.4 MG CAPS capsule Take 0.4 mg by mouth daily.     testosterone cypionate (DEPOTESTOSTERONE CYPIONATE) 200 MG/ML injection Inject 200 mg into the muscle every 14 (fourteen) days. Every 2 weeks     tiZANidine (ZANAFLEX) 4 MG tablet SMARTSIG:1 Tablet(s) By Mouth 1 to 3 Times Daily PRN     trolamine salicylate (ASPERCREME) 10 % cream Apply 1 application topically 2 (two) times daily as needed for muscle pain.      No current facility-administered medications for this visit.    REVIEW OF SYSTEMS:   Constitutional: ( - ) fevers, ( - )  chills , ( - ) night sweats Eyes: ( - ) blurriness of vision, ( - ) double vision, ( - ) watery eyes Ears, nose, mouth, throat, and face: ( - ) mucositis, ( - ) sore throat Respiratory: ( - ) cough, ( - ) dyspnea, ( - ) wheezes Cardiovascular: ( - ) palpitation, ( - ) chest discomfort, ( - ) lower extremity swelling Gastrointestinal:  ( - ) nausea, ( - ) heartburn, ( - ) change in bowel habits Skin: ( - ) abnormal skin  rashes Lymphatics: ( - ) new lymphadenopathy, ( - ) easy bruising Neurological: ( - ) numbness, ( - ) tingling, ( - ) new weaknesses Behavioral/Psych: ( - ) mood change, ( - ) new changes  All other systems were reviewed with the patient and are negative.  PHYSICAL EXAMINATION: ECOG PERFORMANCE STATUS: 1 - Symptomatic but completely ambulatory  Vitals:   05/10/21 1044  BP: 99/60  Pulse: 81  Resp: 17  Temp: 98.1 F (36.7 C)  SpO2: 97%   Filed Weights   05/10/21 1044  Weight: 183 lb 14.4 oz (83.4 kg)    GENERAL: Well-appearing elderly Caucasian male, alert, no distress and comfortable SKIN: skin color, texture, turgor are normal, no rashes or significant lesions EYES: conjunctiva are pink and non-injected, sclera clear LUNGS: clear to auscultation and percussion with normal breathing effort HEART: regular rate & rhythm and no murmurs and no lower extremity edema PSYCH: alert & oriented x 3, fluent speech NEURO: no focal motor/sensory deficits  LABORATORY DATA:  I have reviewed the data as listed CBC Latest Ref Rng & Units 05/10/2021 04/26/2021 03/30/2021  WBC 4.0 - 10.5 K/uL 7.8 9.4 8.4  Hemoglobin 13.0 - 17.0 g/dL 12.3(L) 12.9(L) 14.4  Hematocrit 39.0 - 52.0 % 36.8(L) 39.7 43.9  Platelets 150 - 400 K/uL 176 195 194    CMP Latest Ref Rng & Units 05/10/2021 04/26/2021 03/30/2021  Glucose 70 - 99 mg/dL 111(H) 62(L) 197(H)  BUN 8 - 23 mg/dL _0 Creatinine 0.61 - 1.24 mg/dL 0.81 0.73 0.86  Sodium 135 - 145 mmol/L 131(L) 134(L) 136  Potassium 3.5 - 5.1 mmol/L 3.5 3.8 3.8  Chloride 98 - 111 mmol/L 94(L) 99 99  CO2 22 - 32 mmol/L _1 Calcium 8.9 - 10.3 mg/dL 9.5 9.7 9.7  Total Protein 6.5 - 8.1 g/dL 6.8 6.6 6.8  Total Bilirubin 0.3 - 1.2 mg/dL 0.4 0.4 0.4  Alkaline Phos 38 - 126 U/L 91 81 85  AST 15 - 41 U/L 20 16 13(L)  ALT 0 - 44 U/L _2 RADIOGRAPHIC STUDIES: I  have personally reviewed the radiological images as listed and agreed with the findings in the  report: FDG avid distal esophagus as well as subcutaneous none lymph node region in the right neck. IR IMAGING GUIDED PORT INSERTION  Result Date: 04/24/2021 INDICATION: 75 year old male chronic central venous access for chemotherapy. EXAM: IMPLANTED PORT A CATH PLACEMENT WITH ULTRASOUND AND FLUOROSCOPIC GUIDANCE COMPARISON:  None. MEDICATIONS: None. ANESTHESIA/SEDATION: Moderate (conscious) sedation was employed during this procedure. A total of Versed 2 mg and Fentanyl 100 mcg was administered intravenously. Moderate Sedation Time: 14 minutes. The patient's level of consciousness and vital signs were monitored continuously by radiology nursing throughout the procedure under my direct supervision. CONTRAST:  None FLUOROSCOPY TIME:  0 minutes, 12 seconds (3 mGy) COMPLICATIONS: None immediate. PROCEDURE: The procedure, risks, benefits, and alternatives were explained to the patient. Questions regarding the procedure were encouraged and answered. The patient understands and consents to the procedure. The right neck and chest were prepped with chlorhexidine in a sterile fashion, and a sterile drape was applied covering the operative field. Maximum barrier sterile technique with sterile gowns and gloves were used for the procedure. A timeout was performed prior to the initiation of the procedure. Ultrasound was used to examine the jugular vein which was compressible and free of internal echoes. A skin marker was used to demarcate the planned venotomy and port pocket incision sites. Local anesthesia was provided to these sites and the subcutaneous tunnel track with 1% lidocaine with 1:100,000 epinephrine. A small incision was created at the jugular access site and blunt dissection was performed of the subcutaneous tissues. Under ultrasound guidance, the jugular vein was accessed with a 21 ga micropuncture needle and an 0.018" wire was inserted to the superior vena cava. Real-time ultrasound guidance was utilized for  vascular access including the acquisition of a permanent ultrasound image documenting patency of the accessed vessel. A 5 Fr micopuncture set was then used, through which a 0.035" Rosen wire was passed under fluoroscopic guidance into the inferior vena cava. An 8 Fr dilator was then placed over the wire. A subcutaneous port pocket was then created along the upper chest wall utilizing a combination of sharp and blunt dissection. The pocket was irrigated with sterile saline, packed with gauze, and observed for hemorrhage. A single lumen "ISP" sized power injectable port was chosen for placement. The 8 Fr catheter was tunneled from the port pocket site to the venotomy incision. The port was placed in the pocket. The external catheter was trimmed to appropriate length. The dilator was exchanged for an 8 Fr peel-away sheath under fluoroscopic guidance. The catheter was then placed through the sheath and the sheath was removed. Final catheter positioning was confirmed and documented with a fluoroscopic spot radiograph. The port was accessed with a Huber needle, aspirated, and flushed with heparinized saline. The deep dermal layer of the port pocket incision was closed with interrupted 3-0 Vicryl suture. The skin was opposed with a running subcuticular 4-0 Monocryl suture. Dermabond was then placed over the port pocket and neck incisions. The patient tolerated the procedure well without immediate post procedural complication. FINDINGS: After catheter placement, the tip lies within the superior cavoatrial junction. The catheter aspirates and flushes normally and is ready for immediate use. IMPRESSION: Successful placement of a power injectable Port-A-Cath via the right internal jugular vein. The catheter is ready for immediate use. Ruthann Cancer, MD [facility] Electronically Signed   By: Ruthann Cancer MD   On:  04/24/2021 17:18    ASSESSMENT & PLAN Justin Trujillo  75 y.o. male with medical history significant for metastatic esophageal adenocarcinoma who presents for a follow up visit. The patient is negative for HER2 but has a high TPS score.  Therefore he would be an excellent candidate for FOLFOX with nivolumab.    #Metastatic Adenocarcinoma of the Esophagus #Dysphagia -- Underwent palliative radiation in order to help alleviate his dysphagia. Patient now tolerating PO well.  --Unfortunately given the spread of this cancer to the skin we will need to treat this as metastatic disease with systemic therapy  -- Final testing on the tissue resulted with HER2 negative status, but TPS score of 60%.  Therefore he would be a good candidate for FOLFOX with nivolumab --Started Cycle 1 Day 1 of FOLFOX plus Nivolumab on 04/26/2021 with good tolerance.  --Labs today reviewed without any intervention. WBC 7.8, Hgb stable at 12.3, Plt 176K.  --Recommend to proceed with Cycle 2 Day 1 today.  --RTC in 2 weeks for Cycle 3.   #Supportive Care -- chemotherapy education complete -- port placed -- zofran 37m q8H PRN and compazine 179mPO q6H for nausea -- EMLA cream for port -- HOLD on placing PEG tube as he is currently tolerating PO remarkably well.   No orders of the defined types were placed in this encounter.  All questions were answered. The patient knows to call the clinic with any problems, questions or concerns.  I have spent a total of 30 minutes minutes of face-to-face and non-face-to-face time, preparing to see the patient, performing a medically appropriate examination, counseling and educating the patient, documenting clinical information in the electronic health record, and care coordination.    IrLincoln BrighamPA-C Department of Hematology/Oncology CoLaurelt WeNortheast Regional Medical Centerhone: [phone]/24/2022 10:55 AM

## 2021-05-10 NOTE — Patient Instructions (Signed)
[facility] ONCOLOGY  Discharge Instructions: Thank you for choosing [facility] to provide your oncology and hematology care.   If you have a lab appointment with the [facility], please go directly to the [facility] and check in at the registration area.   Wear comfortable clothing and clothing appropriate for easy access to any Portacath or PICC line.   We strive to give you quality time with your provider. You may need to reschedule your appointment if you arrive late (15 or more minutes).  Arriving late affects you and other patients whose appointments are after yours.  Also, if you miss three or more appointments without notifying the office, you may be dismissed from the clinic at the provider's discretion.      For prescription refill requests, have your pharmacy contact our office and allow 72 hours for refills to be completed.    Today you received the following chemotherapy and/or immunotherapy agents Nivolumab, oxaliplatin, leucovorin, and 5 FU      To help prevent nausea and vomiting after your treatment, we encourage you to take your nausea medication as directed.  BELOW ARE SYMPTOMS THAT SHOULD BE REPORTED IMMEDIATELY: *FEVER GREATER THAN 100.4 F (38 C) OR HIGHER *CHILLS OR SWEATING *NAUSEA AND VOMITING THAT IS NOT CONTROLLED WITH YOUR NAUSEA MEDICATION *UNUSUAL SHORTNESS OF BREATH *UNUSUAL BRUISING OR BLEEDING *URINARY PROBLEMS (pain or burning when urinating, or frequent urination) *BOWEL PROBLEMS (unusual diarrhea, constipation, pain near the anus) TENDERNESS IN MOUTH AND THROAT WITH OR WITHOUT PRESENCE OF ULCERS (sore throat, sores in mouth, or a toothache) UNUSUAL RASH, SWELLING OR PAIN  UNUSUAL VAGINAL DISCHARGE OR ITCHING   Items with * indicate a potential emergency and should be followed up as soon as possible or go to the Emergency Department if any problems should occur.  Please show the CHEMOTHERAPY ALERT CARD or  IMMUNOTHERAPY ALERT CARD at check-in to the Emergency Department and triage nurse.  Should you have questions after your visit or need to cancel or reschedule your appointment, please contact [facility]  Dept: [phone]  and follow the prompts.  Office hours are 8:00 a.m. to 4:30 p.m. Monday - Friday. Please note that voicemails left after 4:00 p.m. may not be returned until the following business day.  We are closed weekends and major holidays. You have access to a nurse at all times for urgent questions. Please call the main number to the clinic Dept: [phone] and follow the prompts.   For any non-urgent questions, you may also contact your provider using MyChart. We now offer e-Visits for anyone 56 and older to request care online for non-urgent symptoms. For details visit [email or web address].   Also download the MyChart app! Go to the app store, search "MyChart", open the app, select [facility], and log in with your MyChart username and password.  Due to Covid, a mask is required upon entering the hospital/clinic. If you do not have a mask, one will be given to you upon arrival. For doctor visits, patients may have 1 support person aged 40 or older with them. For treatment visits, patients cannot have anyone with them due to current Covid guidelines and our immunocompromised population.

## 2021-05-12 ENCOUNTER — Inpatient Hospital Stay: Payer: PPO

## 2021-05-12 ENCOUNTER — Other Ambulatory Visit: Payer: Self-pay

## 2021-05-12 VITALS — BP 100/64 | HR 100 | Temp 98.2°F | Resp 20

## 2021-05-12 DIAGNOSIS — Z5111 Encounter for antineoplastic chemotherapy: Secondary | ICD-10-CM | POA: Diagnosis not present

## 2021-05-12 DIAGNOSIS — C155 Malignant neoplasm of lower third of esophagus: Secondary | ICD-10-CM

## 2021-05-12 MED ORDER — SODIUM CHLORIDE 0.9% FLUSH
10.0000 mL | INTRAVENOUS | Status: DC | PRN
  Administered 2021-05-12: 10 mL

## 2021-05-12 MED ORDER — HEPARIN SOD (PORK) LOCK FLUSH 100 UNIT/ML IV SOLN
500.0000 [IU] | Freq: Once | INTRAVENOUS | Status: AC | PRN
Start: 2021-05-12 — End: 2021-05-12
  Administered 2021-05-12: 500 [IU]

## 2021-05-15 ENCOUNTER — Ambulatory Visit
Admission: RE | Admit: 2021-05-15 | Discharge: 2021-05-15 | Disposition: A | Discharge status: Home / Self Care | Payer: PPO | Source: Ambulatory Visit | Attending: Radiation Oncology | Admitting: Radiation Oncology

## 2021-05-15 DIAGNOSIS — R42 Dizziness and giddiness: Secondary | ICD-10-CM | POA: Diagnosis not present

## 2021-05-15 DIAGNOSIS — I10 Essential (primary) hypertension: Secondary | ICD-10-CM | POA: Diagnosis not present

## 2021-05-15 DIAGNOSIS — J209 Acute bronchitis, unspecified: Secondary | ICD-10-CM | POA: Diagnosis not present

## 2021-05-15 DIAGNOSIS — G4700 Insomnia, unspecified: Secondary | ICD-10-CM | POA: Diagnosis not present

## 2021-05-15 DIAGNOSIS — C155 Malignant neoplasm of lower third of esophagus: Secondary | ICD-10-CM | POA: Insufficient documentation

## 2021-05-15 DIAGNOSIS — F17200 Nicotine dependence, unspecified, uncomplicated: Secondary | ICD-10-CM | POA: Diagnosis not present

## 2021-05-15 NOTE — Progress Notes (Signed)
  Radiation Oncology         [phone]) [phone] ________________________________  Name: Justin Trujillo MRN: [id number]  Date of Service: 05/15/2021  DOB: 01-Dec-1945  Post Treatment Telephone Note  Diagnosis:   Stage IV esophageal adenocarcinoma involving the distal esophagus and right neck  Interval Since Last Radiation:  6 weeks   03/16/2021 through 04/05/2021 Site Technique Total Dose (Gy) Dose per Fx (Gy) Completed Fx Beam Energies  Esophagus: Esoph IMRT 39/39 3 13/13 6X  Neck Right: HN_Rt specialPort 30/30 3 10/10 6E    Narrative:  The patient was contacted today for routine follow-up. During treatment he did very well with radiotherapy and did not have significant desquamation.    Impression/Plan: 1. Stage IV esophageal adenocarcinoma involving the distal esophagus and right neck. I was unable to reach the patient but left a voicemail and on the message, I discussed that we would be happy to continue to follow him as needed, but he will also continue to follow up with Dr. Lorenso Courier in medical oncology.      Carola Rhine, PAC

## 2021-05-16 ENCOUNTER — Telehealth: Payer: Self-pay

## 2021-05-16 NOTE — Telephone Encounter (Signed)
Pt LM wanting to know what his tx plan is after 05/24/21.  I have called the pt back and LM advising his ongoing tx plan will be discussed with him in detail at his 05/24/21 appt.   Pt also wanted to schedule a PF appt and I advised in my message that he has this already scheduled next week on 05/24/21 before his OV.

## 2021-05-23 ENCOUNTER — Other Ambulatory Visit: Payer: Self-pay

## 2021-05-23 DIAGNOSIS — C155 Malignant neoplasm of lower third of esophagus: Secondary | ICD-10-CM

## 2021-05-24 ENCOUNTER — Other Ambulatory Visit: Payer: Self-pay | Admitting: Hematology and Oncology

## 2021-05-24 ENCOUNTER — Inpatient Hospital Stay (HOSPITAL_BASED_OUTPATIENT_CLINIC_OR_DEPARTMENT_OTHER): Payer: PPO | Admitting: Physician Assistant

## 2021-05-24 ENCOUNTER — Inpatient Hospital Stay: Payer: PPO | Attending: Hematology and Oncology

## 2021-05-24 ENCOUNTER — Inpatient Hospital Stay: Payer: PPO

## 2021-05-24 ENCOUNTER — Other Ambulatory Visit: Payer: Self-pay

## 2021-05-24 VITALS — BP 120/76 | HR 92 | Temp 97.8°F | Resp 17 | Ht 67.0 in | Wt 181.8 lb

## 2021-05-24 DIAGNOSIS — G8929 Other chronic pain: Secondary | ICD-10-CM | POA: Insufficient documentation

## 2021-05-24 DIAGNOSIS — I2510 Atherosclerotic heart disease of native coronary artery without angina pectoris: Secondary | ICD-10-CM | POA: Diagnosis not present

## 2021-05-24 DIAGNOSIS — Z79899 Other long term (current) drug therapy: Secondary | ICD-10-CM | POA: Diagnosis not present

## 2021-05-24 DIAGNOSIS — Z720 Tobacco use: Secondary | ICD-10-CM | POA: Diagnosis not present

## 2021-05-24 DIAGNOSIS — E119 Type 2 diabetes mellitus without complications: Secondary | ICD-10-CM | POA: Diagnosis not present

## 2021-05-24 DIAGNOSIS — F32A Depression, unspecified: Secondary | ICD-10-CM | POA: Insufficient documentation

## 2021-05-24 DIAGNOSIS — R6881 Early satiety: Secondary | ICD-10-CM | POA: Diagnosis not present

## 2021-05-24 DIAGNOSIS — M549 Dorsalgia, unspecified: Secondary | ICD-10-CM | POA: Insufficient documentation

## 2021-05-24 DIAGNOSIS — Z79891 Long term (current) use of opiate analgesic: Secondary | ICD-10-CM | POA: Insufficient documentation

## 2021-05-24 DIAGNOSIS — R1310 Dysphagia, unspecified: Secondary | ICD-10-CM | POA: Diagnosis not present

## 2021-05-24 DIAGNOSIS — C792 Secondary malignant neoplasm of skin: Secondary | ICD-10-CM

## 2021-05-24 DIAGNOSIS — R634 Abnormal weight loss: Secondary | ICD-10-CM | POA: Insufficient documentation

## 2021-05-24 DIAGNOSIS — C155 Malignant neoplasm of lower third of esophagus: Secondary | ICD-10-CM | POA: Diagnosis not present

## 2021-05-24 DIAGNOSIS — I10 Essential (primary) hypertension: Secondary | ICD-10-CM | POA: Insufficient documentation

## 2021-05-24 DIAGNOSIS — C153 Malignant neoplasm of upper third of esophagus: Secondary | ICD-10-CM | POA: Diagnosis not present

## 2021-05-24 DIAGNOSIS — Z7984 Long term (current) use of oral hypoglycemic drugs: Secondary | ICD-10-CM | POA: Diagnosis not present

## 2021-05-24 DIAGNOSIS — Z5111 Encounter for antineoplastic chemotherapy: Secondary | ICD-10-CM | POA: Insufficient documentation

## 2021-05-24 DIAGNOSIS — Z95828 Presence of other vascular implants and grafts: Secondary | ICD-10-CM

## 2021-05-24 DIAGNOSIS — J449 Chronic obstructive pulmonary disease, unspecified: Secondary | ICD-10-CM | POA: Diagnosis not present

## 2021-05-24 DIAGNOSIS — F17210 Nicotine dependence, cigarettes, uncomplicated: Secondary | ICD-10-CM | POA: Insufficient documentation

## 2021-05-24 LAB — CBC WITH DIFFERENTIAL (CANCER CENTER ONLY)
Abs Immature Granulocytes: 0.03 10*3/uL (ref 0.00–0.07)
Basophils Absolute: 0 10*3/uL (ref 0.0–0.1)
Basophils Relative: 1 %
Eosinophils Absolute: 0.2 10*3/uL (ref 0.0–0.5)
Eosinophils Relative: 2 %
HCT: 37.5 % — ABNORMAL LOW (ref 39.0–52.0)
Hemoglobin: 12.6 g/dL — ABNORMAL LOW (ref 13.0–17.0)
Immature Granulocytes: 0 %
Lymphocytes Relative: 7 %
Lymphs Abs: 0.6 10*3/uL — ABNORMAL LOW (ref 0.7–4.0)
MCH: 29.7 pg (ref 26.0–34.0)
MCHC: 33.6 g/dL (ref 30.0–36.0)
MCV: 88.4 fL (ref 80.0–100.0)
Monocytes Absolute: 0.7 10*3/uL (ref 0.1–1.0)
Monocytes Relative: 8 %
Neutro Abs: 6.7 10*3/uL (ref 1.7–7.7)
Neutrophils Relative %: 82 %
Platelet Count: 147 10*3/uL — ABNORMAL LOW (ref 150–400)
RBC: 4.24 MIL/uL (ref 4.22–5.81)
RDW: 19.4 % — ABNORMAL HIGH (ref 11.5–15.5)
WBC Count: 8.2 10*3/uL (ref 4.0–10.5)
nRBC: 0 % (ref 0.0–0.2)

## 2021-05-24 LAB — CMP (CANCER CENTER ONLY)
ALT: 29 U/L (ref 0–44)
AST: 34 U/L (ref 15–41)
Albumin: 3.6 g/dL (ref 3.5–5.0)
Alkaline Phosphatase: 97 U/L (ref 38–126)
Anion gap: 11 (ref 5–15)
BUN: 11 mg/dL (ref 8–23)
CO2: 22 mmol/L (ref 22–32)
Calcium: 9.4 mg/dL (ref 8.9–10.3)
Chloride: 102 mmol/L (ref 98–111)
Creatinine: 0.8 mg/dL (ref 0.61–1.24)
GFR, Estimated: >60 mL/min (ref >60)
Glucose, Bld: 73 mg/dL (ref 70–99)
Potassium: 4 mmol/L (ref 3.5–5.1)
Sodium: 135 mmol/L (ref 135–145)
Total Bilirubin: 0.4 mg/dL (ref 0.3–1.2)
Total Protein: 6.8 g/dL (ref 6.5–8.1)

## 2021-05-24 MED ORDER — SODIUM CHLORIDE 0.9% FLUSH
10.0000 mL | Freq: Once | INTRAVENOUS | Status: AC
  Administered 2021-05-24: 10 mL via INTRAVENOUS

## 2021-05-24 MED ORDER — HEPARIN SOD (PORK) LOCK FLUSH 100 UNIT/ML IV SOLN: 500.0000 [IU] | Freq: Once | INTRAVENOUS | Status: DC | PRN

## 2021-05-24 MED ORDER — PALONOSETRON HCL INJECTION 0.25 MG/5ML
0.2500 mg | Freq: Once | INTRAVENOUS | Status: AC
  Administered 2021-05-24: 0.25 mg via INTRAVENOUS
  Filled 2021-05-24: qty 5

## 2021-05-24 MED ORDER — FLUOROURACIL CHEMO INJECTION 2.5 GM/50ML
400.0000 mg/m2 | Freq: Once | INTRAVENOUS | Status: AC
  Administered 2021-05-24: 800 mg via INTRAVENOUS
  Filled 2021-05-24: qty 16

## 2021-05-24 MED ORDER — SODIUM CHLORIDE 0.9 % IV SOLN
2475.0000 mg/m2 | INTRAVENOUS | Status: DC
  Administered 2021-05-24: 5000 mg via INTRAVENOUS
  Filled 2021-05-24: qty 100

## 2021-05-24 MED ORDER — DEXTROSE 5 % IV SOLN: Freq: Once | INTRAVENOUS | Status: AC

## 2021-05-24 MED ORDER — SODIUM CHLORIDE 0.9 % IV SOLN
10.0000 mg | Freq: Once | INTRAVENOUS | Status: AC
  Administered 2021-05-24: 10 mg via INTRAVENOUS
  Filled 2021-05-24: qty 10

## 2021-05-24 MED ORDER — OXALIPLATIN CHEMO INJECTION 100 MG/20ML
85.0000 mg/m2 | Freq: Once | INTRAVENOUS | Status: AC
  Administered 2021-05-24: 170 mg via INTRAVENOUS
  Filled 2021-05-24: qty 34

## 2021-05-24 MED ORDER — SODIUM CHLORIDE 0.9 % IV SOLN
240.0000 mg | Freq: Once | INTRAVENOUS | Status: AC
  Administered 2021-05-24: 240 mg via INTRAVENOUS
  Filled 2021-05-24: qty 24

## 2021-05-24 MED ORDER — SODIUM CHLORIDE 0.9% FLUSH: 10.0000 mL | INTRAVENOUS | Status: DC | PRN

## 2021-05-24 MED ORDER — LEUCOVORIN CALCIUM INJECTION 350 MG
400.0000 mg/m2 | Freq: Once | INTRAVENOUS | Status: AC
  Administered 2021-05-24: 808 mg via INTRAVENOUS
  Filled 2021-05-24: qty 40.4

## 2021-05-24 NOTE — Patient Instructions (Signed)
[facility] ONCOLOGY  Discharge Instructions: Thank you for choosing [facility] to provide your oncology and hematology care.   If you have a lab appointment with the [facility], please go directly to the [facility] and check in at the registration area.   Wear comfortable clothing and clothing appropriate for easy access to any Portacath or PICC line.   We strive to give you quality time with your provider. You may need to reschedule your appointment if you arrive late (15 or more minutes).  Arriving late affects you and other patients whose appointments are after yours.  Also, if you miss three or more appointments without notifying the office, you may be dismissed from the clinic at the provider's discretion.      For prescription refill requests, have your pharmacy contact our office and allow 72 hours for refills to be completed.    Today you received the following chemotherapy and/or immunotherapy agents Nivolumab, oxaliplatin, leucovorin, and 5 FU      To help prevent nausea and vomiting after your treatment, we encourage you to take your nausea medication as directed.  BELOW ARE SYMPTOMS THAT SHOULD BE REPORTED IMMEDIATELY: *FEVER GREATER THAN 100.4 F (38 C) OR HIGHER *CHILLS OR SWEATING *NAUSEA AND VOMITING THAT IS NOT CONTROLLED WITH YOUR NAUSEA MEDICATION *UNUSUAL SHORTNESS OF BREATH *UNUSUAL BRUISING OR BLEEDING *URINARY PROBLEMS (pain or burning when urinating, or frequent urination) *BOWEL PROBLEMS (unusual diarrhea, constipation, pain near the anus) TENDERNESS IN MOUTH AND THROAT WITH OR WITHOUT PRESENCE OF ULCERS (sore throat, sores in mouth, or a toothache) UNUSUAL RASH, SWELLING OR PAIN  UNUSUAL VAGINAL DISCHARGE OR ITCHING   Items with * indicate a potential emergency and should be followed up as soon as possible or go to the Emergency Department if any problems should occur.  Please show the CHEMOTHERAPY ALERT CARD or  IMMUNOTHERAPY ALERT CARD at check-in to the Emergency Department and triage nurse.  Should you have questions after your visit or need to cancel or reschedule your appointment, please contact [facility]  Dept: [phone]  and follow the prompts.  Office hours are 8:00 a.m. to 4:30 p.m. Monday - Friday. Please note that voicemails left after 4:00 p.m. may not be returned until the following business day.  We are closed weekends and major holidays. You have access to a nurse at all times for urgent questions. Please call the main number to the clinic Dept: [phone] and follow the prompts.   For any non-urgent questions, you may also contact your provider using MyChart. We now offer e-Visits for anyone 25 and older to request care online for non-urgent symptoms. For details visit [email or web address].   Also download the MyChart app! Go to the app store, search "MyChart", open the app, select [facility], and log in with your MyChart username and password.  Due to Covid, a mask is required upon entering the hospital/clinic. If you do not have a mask, one will be given to you upon arrival. For doctor visits, patients may have 1 support person aged 60 or older with them. For treatment visits, patients cannot have anyone with them due to current Covid guidelines and our immunocompromised population.

## 2021-05-24 NOTE — Progress Notes (Signed)
[place] [phone]   [phone]  PROGRESS NOTE  Patient Care Team: Carol Ada, MD as PCP - General (Family Medicine) Evans Lance, MD as PCP - Cardiology (Cardiology) Orson Slick, MD as Consulting Physician (Hematology and Oncology)  Hematological/Oncological History # Metastatic Adenocarcinoma of the Distal Esophagus 01/27/2021: dermatology performed a punch biopsy of the right superior lateral anterior neck. Finding show adenocarcinoma negative for PSA and cytokeratin 20.  02/06/2021: establish care with Dr. Lorenso Courier  02/15/2021: EGD found esophageal mass. Biopsy confirms poorly differentiated carcinoma 02/22/2021: PET CT scan shows FDG avid mass in distal esophagus and subcutaneous tissue or right neck.  03/22/2021-04/05/2021: palliative radiation to the esophageal mass.  04/26/2021: Cycle 1 Day 1 of FOLFOX + Nivolumab 05/10/2021: Cycle 2 Day 1 of FOLFOX + Nivolumab 05/24/2021: Cycle 3 Day 1 of FOLFOX + Nivolumab  Interval History:  JARMARCUS WAMBOLD 75 y.o. male with medical history significant for metastatic esophageal adenocarcinoma who presents for a follow up visit. The patient's last visit was on 05/10/2021. In the interim since the last visit he has completed Cycle 2 of FOLFOX plus Nivolumab.   On exam today Mr. Gerling is accompanied by his wife for this visit. He reports overall stable energy levels. He continues to have intermittent episodes of dysphagia mainly with solid foods. He denies regurgitation or odynophagia. He reports early satiety so continues to loose weight. He is supplementing his diet with 2-3 protein shakes per day.  He denies nausea, vomiting or abdominal pain. He is taking antiemetics certain days to prevent nausea. His bowel movements are unchanged. He takes miralax as needed when he develops constipation. He experienced cold sensitivity after treatment for about one week but denies any residual neuropathy. He denies easy  bruising or signs of bleeding. He continues to have chronic back pain that is managed with percocet. He is trying to stop smoking but struggles with chain smoking. He is interested in enrolling in a tobacco cessation program. He denies fevers, chills, night sweats, shortness of breath, chest pain or cough. He has no other complaints. Rest of the 10 point ROS is below.  MEDICAL HISTORY:  Past Medical History:  Diagnosis Date   Arthritis    Coronary atherosclerosis of native coronary artery    Depression    Essential tremor    High cholesterol    Hypertension    Hypertension    Hypogonadism male    Obstructive chronic bronchitis with exacerbation (HCC)    Obstructive sleep apnea (adult) (pediatric)    no cpap   Other and unspecified hyperlipidemia    Other emphysema ([place])    Proteinuria 2019   has seen a nephrologist   Shortness of breath    with excertion   Situational stress    Type II or unspecified type diabetes mellitus without mention of complication, not stated as uncontrolled    type 2   Unspecified essential hypertension     SURGICAL HISTORY: Past Surgical History:  Procedure Laterality Date   CARDIAC CATHETERIZATION     IR IMAGING GUIDED PORT INSERTION  04/24/2021   IR KYPHO LUMBAR INC FX REDUCE BONE BX UNI/BIL CANNULATION INC/IMAGING  06/13/2018   L lens implant     R knee replacement x2     REVERSE SHOULDER ARTHROPLASTY Left 03/26/2019   Procedure: REVERSE SHOULDER ARTHROPLASTY;  Surgeon: Justice Britain, MD;  Location: [facility];  Service: Orthopedics;  Laterality: Left;  158mn   TONSILLECTOMY  TONSILLECTOMY AND ADENOIDECTOMY     vascectomy      SOCIAL HISTORY: Social History   Socioeconomic History   Marital status: Married    Spouse name: Not on file   Number of children: Not on file   Years of education: Not on file   Highest education level: Not on file  Occupational History   Occupation: Landscape architect  Tobacco Use   Smoking status:  Every Day    Packs/day: 1.00    Years: 55.00    Pack years: 55.00    Types: Cigarettes   Smokeless tobacco: Never  Vaping Use   Vaping Use: Never used  Substance and Sexual Activity   Alcohol use: Yes    Alcohol/week: 1.0 standard drink    Types: 1 Cans of beer per week    Comment: occasional beer   Drug use: Never   Sexual activity: Not Currently  Other Topics Concern   Not on file  Social History Narrative   Not on file   Social Determinants of Health   Financial Resource Strain: Not on file  Food Insecurity: Not on file  Transportation Needs: Not on file  Physical Activity: Not on file  Stress: Not on file  Social Connections: Not on file  Intimate Partner Violence: Not on file    FAMILY HISTORY: Family History  Problem Relation Age of Onset   Emphysema Father    Heart disease Father    Clotting disorder Mother    Kidney cancer Child     ALLERGIES:  is allergic to iodinated diagnostic agents, moxifloxacin, penicillins, shellfish-derived products, amoxicillin, other, and latex.  MEDICATIONS:  Current Outpatient Medications  Medication Sig Dispense Refill   acetaminophen (TYLENOL) 500 MG tablet Take 650 mg by mouth every 8 (eight) hours as needed (pain).     ALPRAZolam (XANAX) 0.5 MG tablet Take 0.5 mg by mouth 2 (two) times daily.     aspirin EC 81 MG tablet Take 81 mg by mouth daily. (Patient not taking: Reported on 04/26/2021)     atorvastatin (LIPITOR) 40 MG tablet Take 1 tablet (40 mg total) by mouth daily. 30 tablet 0   Calcium Carb-Cholecalciferol (CALCIUM 600+D3 PO) Take 1 tablet by mouth daily. (Patient not taking: Reported on 03/07/2021)     calcium citrate-vitamin D (CITRACAL+D) 315-200 MG-UNIT tablet daily in the afternoon. (Patient not taking: Reported on 03/07/2021)     Cholecalciferol (VITAMIN D) 50 MCG (2000 UT) tablet Take 2,000 Units by mouth daily.     escitalopram (LEXAPRO) 10 MG tablet Take 10 mg by mouth daily.     gabapentin (NEURONTIN) 300 MG  capsule Take 2 capsules (600 mg total) by mouth 3 (three) times daily. (Patient taking differently: Take 300 mg by mouth 3 (three) times daily.) 90 capsule 5   lidocaine-prilocaine (EMLA) cream Apply 1 application topically as needed. 30 g 0   lisinopril-hydrochlorothiazide (ZESTORETIC) 20-12.5 MG tablet Take 1 tablet by mouth daily. 12.5 mg only     metFORMIN (GLUCOPHAGE) 1000 MG tablet Take 1 tablet (1,000 mg total) by mouth 2 (two) times daily with a meal. (Patient taking differently: Take 1,000 mg by mouth at bedtime.) 60 tablet 0   Multiple Vitamins-Minerals (CENTRUM SILVER) tablet daily in the afternoon.     MYRBETRIQ 50 MG TB24 tablet Take 50 mg by mouth daily.     ondansetron (ZOFRAN) 8 MG tablet Take 1 tablet (8 mg total) by mouth every 8 (eight) hours as needed for nausea or vomiting. [place]  tablet 0   prochlorperazine (COMPAZINE) 10 MG tablet Take 1 tablet (10 mg total) by mouth every 6 (six) hours as needed for nausea or vomiting. 30 tablet 0   rOPINIRole (REQUIP) 0.5 MG tablet Take 0.5 mg by mouth at bedtime.     Semaglutide (OZEMPIC, 1 MG/DOSE, [place]) Inject 1 each into the skin once a week.     tamsulosin (FLOMAX) 0.4 MG CAPS capsule Take 0.4 mg by mouth daily.     testosterone cypionate (DEPOTESTOSTERONE CYPIONATE) 200 MG/ML injection Inject 200 mg into the muscle every 14 (fourteen) days. Every 2 weeks     tiZANidine (ZANAFLEX) 4 MG tablet SMARTSIG:1 Tablet(s) By Mouth 1 to 3 Times Daily PRN     trolamine salicylate (ASPERCREME) 10 % cream Apply 1 application topically 2 (two) times daily as needed for muscle pain.      No current facility-administered medications for this visit.   Facility-Administered Medications Ordered in Other Visits  Medication Dose Route Frequency Provider Last Rate Last Admin   fluorouracil (ADRUCIL) 5,000 mg in sodium chloride 0.9 % 150 mL chemo infusion  2,475 mg/m2 (Treatment Plan Recorded) Intravenous 1 day or 1 dose Orson Slick, MD       fluorouracil  (ADRUCIL) chemo injection 800 mg  400 mg/m2 (Treatment Plan Recorded) Intravenous Once Ledell Peoples IV, MD   800 mg at 05/24/21 1523   heparin lock flush 100 unit/mL  500 Units Intracatheter Once PRN Orson Slick, MD       sodium chloride flush (NS) 0.9 % injection 10 mL  10 mL Intracatheter PRN Orson Slick, MD        REVIEW OF SYSTEMS:   Constitutional: ( - ) fevers, ( - )  chills , ( - ) night sweats Eyes: ( - ) blurriness of vision, ( - ) double vision, ( - ) watery eyes Ears, nose, mouth, throat, and face: ( - ) mucositis, ( - ) sore throat Respiratory: ( - ) cough, ( - ) dyspnea, ( - ) wheezes Cardiovascular: ( - ) palpitation, ( - ) chest discomfort, ( - ) lower extremity swelling Gastrointestinal:  ( - ) nausea, ( - ) heartburn, ( - ) change in bowel habits Skin: ( - ) abnormal skin rashes Lymphatics: ( - ) new lymphadenopathy, ( - ) easy bruising Neurological: ( - ) numbness, ( - ) tingling, ( - ) new weaknesses Behavioral/Psych: ( - ) mood change, ( - ) new changes  All other systems were reviewed with the patient and are negative.  PHYSICAL EXAMINATION: ECOG PERFORMANCE STATUS: 1 - Symptomatic but completely ambulatory  Vitals:   05/24/21 1037  BP: 120/76  Pulse: 92  Resp: 17  Temp: 97.8 F (36.6 C)  SpO2: 97%   Filed Weights   05/24/21 1037  Weight: 181 lb 12.8 oz (82.5 kg)    GENERAL: Well-appearing elderly Caucasian male, alert, no distress and comfortable SKIN: skin color, texture, turgor are normal, no rashes or significant lesions EYES: conjunctiva are pink and non-injected, sclera clear LUNGS: clear to auscultation and percussion with normal breathing effort HEART: regular rate & rhythm and no murmurs and no lower extremity edema PSYCH: alert & oriented x 3, fluent speech NEURO: no focal motor/sensory deficits  LABORATORY DATA:  I have reviewed the data as listed CBC Latest Ref Rng & Units 05/24/2021 05/10/2021 04/26/2021  WBC 4.0 - 10.5 K/uL  8.2 7.8 9.4  Hemoglobin 13.0 - 17.0 g/dL  12.6(L) 12.3(L) 12.9(L)  Hematocrit 39.0 - 52.0 % 37.5(L) 36.8(L) 39.7  Platelets 150 - 400 K/uL 147(L) 176 195    CMP Latest Ref Rng & Units 05/24/2021 05/10/2021 04/26/2021  Glucose 70 - 99 mg/dL 73 111(H) 62(L)  BUN 8 - 23 mg/dL _0 Creatinine 0.61 - 1.24 mg/dL 0.80 0.81 0.73  Sodium 135 - 145 mmol/L 135 131(L) 134(L)  Potassium 3.5 - 5.1 mmol/L 4.0 3.5 3.8  Chloride 98 - 111 mmol/L 102 94(L) 99  CO2 22 - 32 mmol/L _1 Calcium 8.9 - 10.3 mg/dL 9.4 9.5 9.7  Total Protein 6.5 - 8.1 g/dL 6.8 6.8 6.6  Total Bilirubin 0.3 - 1.2 mg/dL 0.4 0.4 0.4  Alkaline Phos 38 - 126 U/L 97 91 81  AST 15 - 41 U/L 34 20 16  ALT 0 - 44 U/L _2 RADIOGRAPHIC STUDIES: I have personally reviewed the radiological images as listed and agreed with the findings in the report: FDG avid distal esophagus as well as subcutaneous none lymph node region in the right neck. IR IMAGING GUIDED PORT INSERTION  Result Date: 04/24/2021 INDICATION: 75 year old male chronic central venous access for chemotherapy. EXAM: IMPLANTED PORT A CATH PLACEMENT WITH ULTRASOUND AND FLUOROSCOPIC GUIDANCE COMPARISON:  None. MEDICATIONS: None. ANESTHESIA/SEDATION: Moderate (conscious) sedation was employed during this procedure. A total of Versed 2 mg and Fentanyl 100 mcg was administered intravenously. Moderate Sedation Time: 14 minutes. The patient's level of consciousness and vital signs were monitored continuously by radiology nursing throughout the procedure under my direct supervision. CONTRAST:  None FLUOROSCOPY TIME:  0 minutes, 12 seconds (3 mGy) COMPLICATIONS: None immediate. PROCEDURE: The procedure, risks, benefits, and alternatives were explained to the patient. Questions regarding the procedure were encouraged and answered. The patient understands and consents to the procedure. The right neck and chest were prepped with chlorhexidine in a sterile fashion, and a sterile drape  was applied covering the operative field. Maximum barrier sterile technique with sterile gowns and gloves were used for the procedure. A timeout was performed prior to the initiation of the procedure. Ultrasound was used to examine the jugular vein which was compressible and free of internal echoes. A skin marker was used to demarcate the planned venotomy and port pocket incision sites. Local anesthesia was provided to these sites and the subcutaneous tunnel track with 1% lidocaine with 1:100,000 epinephrine. A small incision was created at the jugular access site and blunt dissection was performed of the subcutaneous tissues. Under ultrasound guidance, the jugular vein was accessed with a 21 ga micropuncture needle and an 0.018" wire was inserted to the superior vena cava. Real-time ultrasound guidance was utilized for vascular access including the acquisition of a permanent ultrasound image documenting patency of the accessed vessel. A 5 Fr micopuncture set was then used, through which a 0.035" Rosen wire was passed under fluoroscopic guidance into the inferior vena cava. An 8 Fr dilator was then placed over the wire. A subcutaneous port pocket was then created along the upper chest wall utilizing a combination of sharp and blunt dissection. The pocket was irrigated with sterile saline, packed with gauze, and observed for hemorrhage. A single lumen "ISP" sized power injectable port was chosen for placement. The 8 Fr catheter was tunneled from the port pocket site to the venotomy incision. The port was placed in the pocket. The external catheter was trimmed to appropriate length. The dilator was exchanged for an 8 Fr peel-away sheath  under fluoroscopic guidance. The catheter was then placed through the sheath and the sheath was removed. Final catheter positioning was confirmed and documented with a fluoroscopic spot radiograph. The port was accessed with a Huber needle, aspirated, and flushed with heparinized  saline. The deep dermal layer of the port pocket incision was closed with interrupted 3-0 Vicryl suture. The skin was opposed with a running subcuticular 4-0 Monocryl suture. Dermabond was then placed over the port pocket and neck incisions. The patient tolerated the procedure well without immediate post procedural complication. FINDINGS: After catheter placement, the tip lies within the superior cavoatrial junction. The catheter aspirates and flushes normally and is ready for immediate use. IMPRESSION: Successful placement of a power injectable Port-A-Cath via the right internal jugular vein. The catheter is ready for immediate use. Ruthann Cancer, MD [facility] Electronically Signed   By: Ruthann Cancer MD   On: 04/24/2021 17:18    ASSESSMENT & PLAN Justin Trujillo 75 y.o. male with medical history significant for metastatic esophageal adenocarcinoma who presents for a follow up visit. The patient is negative for HER2 but has a high TPS score.  Therefore he would be an excellent candidate for FOLFOX with nivolumab.    #Metastatic Adenocarcinoma of the Esophagus #Dysphagia -- Underwent palliative radiation in order to help alleviate his dysphagia. Patient now tolerating PO well.  --Unfortunately given the spread of this cancer to the skin we will need to treat this as metastatic disease with systemic therapy  -- Final testing on the tissue resulted with HER2 negative status, but TPS score of 60%.  Therefore he would be a good candidate for FOLFOX with nivolumab --Started Cycle 1 Day 1 of FOLFOX plus Nivolumab on 04/26/2021 with good tolerance.  --Labs today reviewed without any intervention. WBC 8.2 Hgb stable at 12.6, Plt 147K. CMP is unremarkable.  --Recommend to proceed with Cycle 3 Day 1 today.  --RTC in 2 weeks for Cycle 4.   #Supportive Care -- chemotherapy education complete -- port placed -- zofran 54m q8H PRN and compazine 185mPO q6H  for nausea -- EMLA cream for port -- HOLD on placing PEG tube as he is currently tolerating PO remarkably well.   Orders Placed This Encounter  Procedures   Ambulatory referral to Smoking Cessation Program    Referral Priority:   Routine    Referral Type:   Consultation    Referral Reason:   Specialty Services Required    Number of Visits Requested:   1    All questions were answered. The patient knows to call the clinic with any problems, questions or concerns.  I have spent a total of 30 minutes minutes of face-to-face and non-face-to-face time, preparing to see the patient, performing a medically appropriate examination, counseling and educating the patient, documenting clinical information in the electronic health record, and care coordination.    IrLincoln BrighamPA-C Department of Hematology/Oncology [facility]: [id number]/03/2021 3:27 PM

## 2021-05-26 ENCOUNTER — Other Ambulatory Visit: Payer: Self-pay

## 2021-05-26 ENCOUNTER — Inpatient Hospital Stay: Payer: PPO

## 2021-05-26 VITALS — BP 96/69 | HR 88 | Temp 98.1°F | Resp 15

## 2021-05-26 DIAGNOSIS — Z95828 Presence of other vascular implants and grafts: Secondary | ICD-10-CM

## 2021-05-26 MED ORDER — HEPARIN SOD (PORK) LOCK FLUSH 100 UNIT/ML IV SOLN: 500.0000 [IU] | Freq: Once | INTRAVENOUS | Status: DC

## 2021-05-26 MED ORDER — SODIUM CHLORIDE 0.9% FLUSH: 10.0000 mL | Freq: Once | INTRAVENOUS | Status: DC

## 2021-05-27 DIAGNOSIS — C153 Malignant neoplasm of upper third of esophagus: Secondary | ICD-10-CM | POA: Diagnosis not present

## 2021-05-29 DIAGNOSIS — R948 Abnormal results of function studies of other organs and systems: Secondary | ICD-10-CM | POA: Diagnosis not present

## 2021-05-29 DIAGNOSIS — E291 Testicular hypofunction: Secondary | ICD-10-CM | POA: Diagnosis not present

## 2021-06-01 DIAGNOSIS — R8279 Other abnormal findings on microbiological examination of urine: Secondary | ICD-10-CM | POA: Diagnosis not present

## 2021-06-01 DIAGNOSIS — R350 Frequency of micturition: Secondary | ICD-10-CM | POA: Diagnosis not present

## 2021-06-01 DIAGNOSIS — E291 Testicular hypofunction: Secondary | ICD-10-CM | POA: Diagnosis not present

## 2021-06-01 DIAGNOSIS — N401 Enlarged prostate with lower urinary tract symptoms: Secondary | ICD-10-CM | POA: Diagnosis not present

## 2021-06-05 DIAGNOSIS — M4150 Other secondary scoliosis, site unspecified: Secondary | ICD-10-CM | POA: Diagnosis not present

## 2021-06-05 DIAGNOSIS — M5136 Other intervertebral disc degeneration, lumbar region: Secondary | ICD-10-CM | POA: Diagnosis not present

## 2021-06-05 DIAGNOSIS — M5416 Radiculopathy, lumbar region: Secondary | ICD-10-CM | POA: Diagnosis not present

## 2021-06-05 DIAGNOSIS — Z6827 Body mass index (BMI) 27.0-27.9, adult: Secondary | ICD-10-CM | POA: Diagnosis not present

## 2021-06-06 ENCOUNTER — Other Ambulatory Visit: Payer: Self-pay | Admitting: Neurosurgery

## 2021-06-06 DIAGNOSIS — M51369 Other intervertebral disc degeneration, lumbar region without mention of lumbar back pain or lower extremity pain: Secondary | ICD-10-CM

## 2021-06-06 DIAGNOSIS — M5136 Other intervertebral disc degeneration, lumbar region: Secondary | ICD-10-CM

## 2021-06-07 ENCOUNTER — Inpatient Hospital Stay: Payer: PPO

## 2021-06-07 ENCOUNTER — Other Ambulatory Visit: Payer: Self-pay

## 2021-06-07 ENCOUNTER — Inpatient Hospital Stay (HOSPITAL_BASED_OUTPATIENT_CLINIC_OR_DEPARTMENT_OTHER): Payer: PPO | Admitting: Hematology and Oncology

## 2021-06-07 ENCOUNTER — Other Ambulatory Visit: Payer: Self-pay | Admitting: Hematology and Oncology

## 2021-06-07 VITALS — BP 102/79 | HR 87 | Temp 98.7°F | Resp 17 | Ht 67.0 in | Wt 178.0 lb

## 2021-06-07 DIAGNOSIS — C155 Malignant neoplasm of lower third of esophagus: Secondary | ICD-10-CM | POA: Diagnosis not present

## 2021-06-07 DIAGNOSIS — Z95828 Presence of other vascular implants and grafts: Secondary | ICD-10-CM | POA: Insufficient documentation

## 2021-06-07 DIAGNOSIS — C153 Malignant neoplasm of upper third of esophagus: Secondary | ICD-10-CM | POA: Diagnosis not present

## 2021-06-07 DIAGNOSIS — C159 Malignant neoplasm of esophagus, unspecified: Secondary | ICD-10-CM | POA: Diagnosis not present

## 2021-06-07 DIAGNOSIS — Z5111 Encounter for antineoplastic chemotherapy: Secondary | ICD-10-CM | POA: Diagnosis not present

## 2021-06-07 LAB — CBC WITH DIFFERENTIAL (CANCER CENTER ONLY)
Abs Immature Granulocytes: 0.01 10*3/uL (ref 0.00–0.07)
Basophils Absolute: 0 10*3/uL (ref 0.0–0.1)
Basophils Relative: 1 %
Eosinophils Absolute: 0.1 10*3/uL (ref 0.0–0.5)
Eosinophils Relative: 2 %
HCT: 36.5 % — ABNORMAL LOW (ref 39.0–52.0)
Hemoglobin: 12.3 g/dL — ABNORMAL LOW (ref 13.0–17.0)
Immature Granulocytes: 0 %
Lymphocytes Relative: 12 %
Lymphs Abs: 0.5 10*3/uL — ABNORMAL LOW (ref 0.7–4.0)
MCH: 30.9 pg (ref 26.0–34.0)
MCHC: 33.7 g/dL (ref 30.0–36.0)
MCV: 91.7 fL (ref 80.0–100.0)
Monocytes Absolute: 0.3 10*3/uL (ref 0.1–1.0)
Monocytes Relative: 9 %
Neutro Abs: 3 10*3/uL (ref 1.7–7.7)
Neutrophils Relative %: 76 %
Platelet Count: 118 10*3/uL — ABNORMAL LOW (ref 150–400)
RBC: 3.98 MIL/uL — ABNORMAL LOW (ref 4.22–5.81)
RDW: 20.1 % — ABNORMAL HIGH (ref 11.5–15.5)
WBC Count: 3.9 10*3/uL — ABNORMAL LOW (ref 4.0–10.5)
nRBC: 0 % (ref 0.0–0.2)

## 2021-06-07 LAB — CMP (CANCER CENTER ONLY)
ALT: 29 U/L (ref 0–44)
AST: 31 U/L (ref 15–41)
Albumin: 3.5 g/dL (ref 3.5–5.0)
Alkaline Phosphatase: 84 U/L (ref 38–126)
Anion gap: 10 (ref 5–15)
BUN: 11 mg/dL (ref 8–23)
CO2: 24 mmol/L (ref 22–32)
Calcium: 9.4 mg/dL (ref 8.9–10.3)
Chloride: 101 mmol/L (ref 98–111)
Creatinine: 0.75 mg/dL (ref 0.61–1.24)
GFR, Estimated: >60 mL/min (ref >60)
Glucose, Bld: 75 mg/dL (ref 70–99)
Potassium: 3.9 mmol/L (ref 3.5–5.1)
Sodium: 135 mmol/L (ref 135–145)
Total Bilirubin: 0.6 mg/dL (ref 0.3–1.2)
Total Protein: 6.5 g/dL (ref 6.5–8.1)

## 2021-06-07 MED ORDER — LEUCOVORIN CALCIUM INJECTION 350 MG
400.0000 mg/m2 | Freq: Once | INTRAVENOUS | Status: AC
  Administered 2021-06-07: 808 mg via INTRAVENOUS
  Filled 2021-06-07: qty 40.4

## 2021-06-07 MED ORDER — PALONOSETRON HCL INJECTION 0.25 MG/5ML
0.2500 mg | Freq: Once | INTRAVENOUS | Status: AC
  Administered 2021-06-07: 0.25 mg via INTRAVENOUS
  Filled 2021-06-07: qty 5

## 2021-06-07 MED ORDER — DEXAMETHASONE SODIUM PHOSPHATE 100 MG/10ML IJ SOLN
10.0000 mg | Freq: Once | INTRAMUSCULAR | Status: AC
  Administered 2021-06-07: 10 mg via INTRAVENOUS
  Filled 2021-06-07: qty 10

## 2021-06-07 MED ORDER — SODIUM CHLORIDE 0.9 % IV SOLN
2475.0000 mg/m2 | INTRAVENOUS | Status: DC
  Administered 2021-06-07: 5000 mg via INTRAVENOUS
  Filled 2021-06-07: qty 100

## 2021-06-07 MED ORDER — OXALIPLATIN CHEMO INJECTION 100 MG/20ML
85.0000 mg/m2 | Freq: Once | INTRAVENOUS | Status: AC
  Administered 2021-06-07: 170 mg via INTRAVENOUS
  Filled 2021-06-07: qty 34

## 2021-06-07 MED ORDER — SODIUM CHLORIDE 0.9 % IV SOLN
240.0000 mg | Freq: Once | INTRAVENOUS | Status: AC
  Administered 2021-06-07: 240 mg via INTRAVENOUS
  Filled 2021-06-07: qty 24

## 2021-06-07 MED ORDER — DEXTROSE 5 % IV SOLN: Freq: Once | INTRAVENOUS | Status: AC

## 2021-06-07 MED ORDER — SODIUM CHLORIDE 0.9% FLUSH
10.0000 mL | Freq: Once | INTRAVENOUS | Status: AC
Start: 2021-06-07 — End: 2021-06-07
  Administered 2021-06-07: 10 mL

## 2021-06-07 MED ORDER — FLUOROURACIL CHEMO INJECTION 2.5 GM/50ML
400.0000 mg/m2 | Freq: Once | INTRAVENOUS | Status: AC
  Administered 2021-06-07: 800 mg via INTRAVENOUS
  Filled 2021-06-07: qty 16

## 2021-06-07 NOTE — Progress Notes (Signed)
[name] [phone]   [phone]  PROGRESS NOTE  Patient Care Team: Carol Ada, MD as PCP - General (Family Medicine) Evans Lance, MD as PCP - Cardiology (Cardiology) Orson Slick, MD as Consulting Physician (Hematology and Oncology)  Hematological/Oncological History # Metastatic Adenocarcinoma of the Distal Esophagus 01/27/2021: dermatology performed a punch biopsy of the right superior lateral anterior neck. Finding show adenocarcinoma negative for PSA and cytokeratin 20.  02/06/2021: establish care with Dr. Lorenso Courier  02/15/2021: EGD found esophageal mass. Biopsy confirms poorly differentiated carcinoma 02/22/2021: PET CT scan shows FDG avid mass in distal esophagus and subcutaneous tissue or right neck.  03/22/2021-04/05/2021: palliative radiation to the esophageal mass.  04/26/2021: Cycle 1 Day 1 of FOLFOX + Nivolumab 05/10/2021: Cycle 2 Day 1 of FOLFOX + Nivolumab 05/24/2021: Cycle 3 Day 1 of FOLFOX + Nivolumab 06/07/2021: Cycle 4 Day 1 of FOLFOX + Nivolumab  Interval History:  Justin Trujillo 75 y.o. male with medical history significant for metastatic esophageal adenocarcinoma who presents for a follow up visit. The patient's last visit was on 05/10/2021. In the interim since the last visit he has completed Cycle 3  of FOLFOX plus Nivolumab.   On exam today Justin Trujillo reports he has been doing well interim since her last visit.  He unfortunately has lost 3 pounds of weight but he attributes this to the fact that he drinks too much liquid he has no tolerance for food, likewise if he eats too much he has allof her liquid.  He notes he has been doing his best to drink boost in the morning.  He is eating solid food well but still has trouble with mac & cheese as well as pasta.  He notes he tried steak a few weeks ago and it did "did not go well".  He notes that the major side effect he has been experiencing has been fatigue and cold sensitivity.  He  denies fevers, chills, night sweats, shortness of breath, chest pain or cough. He has no other complaints. Rest of the 10 point ROS is below.  MEDICAL HISTORY:  Past Medical History:  Diagnosis Date   Arthritis    Coronary atherosclerosis of native coronary artery    Depression    Essential tremor    High cholesterol    Hypertension    Hypertension    Hypogonadism male    Obstructive chronic bronchitis with exacerbation (HCC)    Obstructive sleep apnea (adult) (pediatric)    no cpap   Other and unspecified hyperlipidemia    Other emphysema ([place])    Proteinuria 2019   has seen a nephrologist   Shortness of breath    with excertion   Situational stress    Type II or unspecified type diabetes mellitus without mention of complication, not stated as uncontrolled    type 2   Unspecified essential hypertension     SURGICAL HISTORY: Past Surgical History:  Procedure Laterality Date   CARDIAC CATHETERIZATION     IR IMAGING GUIDED PORT INSERTION  04/24/2021   IR KYPHO LUMBAR INC FX REDUCE BONE BX UNI/BIL CANNULATION INC/IMAGING  06/13/2018   L lens implant     R knee replacement x2     REVERSE SHOULDER ARTHROPLASTY Left 03/26/2019   Procedure: REVERSE SHOULDER ARTHROPLASTY;  Surgeon: Justice Britain, MD;  Location: [facility];  Service: Orthopedics;  Laterality: Left;  145mn   TONSILLECTOMY     TONSILLECTOMY AND ADENOIDECTOMY     vascectomy  SOCIAL HISTORY: Social History   Socioeconomic History   Marital status: Married    Spouse name: Not on file   Number of children: Not on file   Years of education: Not on file   Highest education level: Not on file  Occupational History   Occupation: Landscape architect  Tobacco Use   Smoking status: Every Day    Packs/day: 1.00    Years: 55.00    Pack years: 55.00    Types: Cigarettes   Smokeless tobacco: Never  Vaping Use   Vaping Use: Never used  Substance and Sexual Activity   Alcohol use: Yes    Alcohol/week: 1.0  standard drink    Types: 1 Cans of beer per week    Comment: occasional beer   Drug use: Never   Sexual activity: Not Currently  Other Topics Concern   Not on file  Social History Narrative   Not on file   Social Determinants of Health   Financial Resource Strain: Not on file  Food Insecurity: Not on file  Transportation Needs: Not on file  Physical Activity: Not on file  Stress: Not on file  Social Connections: Not on file  Intimate Partner Violence: Not on file    FAMILY HISTORY: Family History  Problem Relation Age of Onset   Emphysema Father    Heart disease Father    Clotting disorder Mother    Kidney cancer Child     ALLERGIES:  is allergic to iodinated diagnostic agents, moxifloxacin, penicillins, shellfish-derived products, amoxicillin, other, and latex.  MEDICATIONS:  Current Outpatient Medications  Medication Sig Dispense Refill   Calcium Carb-Cholecalciferol (CALCIUM 600+D3 PO) Take 1 tablet by mouth daily.     diazepam (VALIUM) 5 MG tablet 1 PO 60  minutes prior to procedure with 2nd dose 15-20 min prior if needed     acetaminophen (TYLENOL) 500 MG tablet Take 650 mg by mouth every 8 (eight) hours as needed (pain).     albuterol (VENTOLIN HFA) 108 (90 Base) MCG/ACT inhaler SMARTSIG:1 Puff(s) Via Inhaler Every 4 Hours PRN     ALPRAZolam (XANAX) 0.5 MG tablet Take 0.5 mg by mouth 2 (two) times daily.     aspirin EC 81 MG tablet Take 81 mg by mouth daily. (Patient not taking: Reported on 04/26/2021)     atorvastatin (LIPITOR) 40 MG tablet Take 1 tablet (40 mg total) by mouth daily. 30 tablet 0   calcium citrate-vitamin D (CITRACAL+D) 315-200 MG-UNIT tablet daily in the afternoon. (Patient not taking: Reported on 03/07/2021)     Cholecalciferol (VITAMIN D) 50 MCG (2000 UT) tablet Take 2,000 Units by mouth daily.     escitalopram (LEXAPRO) 10 MG tablet Take 10 mg by mouth daily.     gabapentin (NEURONTIN) 300 MG capsule Take 2 capsules (600 mg total) by mouth 3  (three) times daily. (Patient taking differently: Take 300 mg by mouth 3 (three) times daily.) 90 capsule 5   lidocaine-prilocaine (EMLA) cream Apply 1 application topically as needed. 30 g 0   lisinopril (ZESTRIL) 10 MG tablet Take 10 mg by mouth daily.     metFORMIN (GLUCOPHAGE) 1000 MG tablet Take 1 tablet (1,000 mg total) by mouth 2 (two) times daily with a meal. (Patient taking differently: Take 1,000 mg by mouth at bedtime.) 60 tablet 0   Multiple Vitamins-Minerals (CENTRUM SILVER) tablet daily in the afternoon.     MYRBETRIQ 50 MG TB24 tablet Take 50 mg by mouth daily.     ondansetron (  ZOFRAN) 8 MG tablet Take 1 tablet (8 mg total) by mouth every 8 (eight) hours as needed for nausea or vomiting. 20 tablet 0   oxyCODONE-acetaminophen (PERCOCET) 10-325 MG tablet Take 1 tablet by mouth every 6 (six) hours as needed.     prochlorperazine (COMPAZINE) 10 MG tablet Take 1 tablet (10 mg total) by mouth every 6 (six) hours as needed for nausea or vomiting. 30 tablet 0   rOPINIRole (REQUIP) 0.5 MG tablet Take 0.5 mg by mouth at bedtime.     Semaglutide,0.25 or 0.5MG/DOS, (OZEMPIC, 0.25 OR 0.5 MG/DOSE,) 2 MG/1.5ML SOPN Ozempic 0.25 mg or 0.5 mg (2 mg/1.5 mL) subcutaneous pen injector  INJECT 0.5 MG SUBCUTANEOUSLY ONCE WEEKLY     sulfamethoxazole-trimethoprim (BACTRIM DS) 800-160 MG tablet Take 1 tablet by mouth 2 (two) times daily.     tamsulosin (FLOMAX) 0.4 MG CAPS capsule Take 0.4 mg by mouth daily.     Testosterone 20.25 MG/ACT (1.62%) GEL testosterone 20.25 mg/1.25 gram (1.62 %) transdermal gel pump  APPLY ONE PUMP TO EACH SHOULDER DAILY     tiZANidine (ZANAFLEX) 4 MG tablet SMARTSIG:1 Tablet(s) By Mouth 1 to 3 Times Daily PRN     trolamine salicylate (ASPERCREME) 10 % cream Apply 1 application topically 2 (two) times daily as needed for muscle pain.      No current facility-administered medications for this visit.   Facility-Administered Medications Ordered in Other Visits  Medication Dose  Route Frequency Provider Last Rate Last Admin   fluorouracil (ADRUCIL) 5,000 mg in sodium chloride 0.9 % 150 mL chemo infusion  2,475 mg/m2 (Treatment Plan Recorded) Intravenous 1 day or 1 dose Orson Slick, MD       fluorouracil (ADRUCIL) chemo injection 800 mg  400 mg/m2 (Treatment Plan Recorded) Intravenous Once Orson Slick, MD       leucovorin 808 mg in dextrose 5 % 250 mL infusion  400 mg/m2 (Treatment Plan Recorded) Intravenous Once Orson Slick, MD       nivolumab (OPDIVO) 240 mg in sodium chloride 0.9 % 100 mL chemo infusion  240 mg Intravenous Once Orson Slick, MD       oxaliplatin (ELOXATIN) 170 mg in dextrose 5 % 500 mL chemo infusion  85 mg/m2 (Treatment Plan Recorded) Intravenous Once Orson Slick, MD        REVIEW OF SYSTEMS:   Constitutional: ( - ) fevers, ( - )  chills , ( - ) night sweats Eyes: ( - ) blurriness of vision, ( - ) double vision, ( - ) watery eyes Ears, nose, mouth, throat, and face: ( - ) mucositis, ( - ) sore throat Respiratory: ( - ) cough, ( - ) dyspnea, ( - ) wheezes Cardiovascular: ( - ) palpitation, ( - ) chest discomfort, ( - ) lower extremity swelling Gastrointestinal:  ( - ) nausea, ( - ) heartburn, ( - ) change in bowel habits Skin: ( - ) abnormal skin rashes Lymphatics: ( - ) new lymphadenopathy, ( - ) easy bruising Neurological: ( - ) numbness, ( - ) tingling, ( - ) new weaknesses Behavioral/Psych: ( - ) mood change, ( - ) new changes  All other systems were reviewed with the patient and are negative.  PHYSICAL EXAMINATION: ECOG PERFORMANCE STATUS: 1 - Symptomatic but completely ambulatory  Vitals:   06/07/21 0841  BP: 102/79  Pulse: 87  Resp: 17  Temp: 98.7 F (37.1 C)  SpO2: 97%  Filed Weights   06/07/21 0841  Weight: 178 lb (80.7 kg)    GENERAL: Well-appearing elderly Caucasian male, alert, no distress and comfortable SKIN: skin color, texture, turgor are normal, no rashes or significant lesions EYES:  conjunctiva are pink and non-injected, sclera clear LUNGS: clear to auscultation and percussion with normal breathing effort HEART: regular rate & rhythm and no murmurs and no lower extremity edema PSYCH: alert & oriented x 3, fluent speech NEURO: no focal motor/sensory deficits  LABORATORY DATA:  I have reviewed the data as listed CBC Latest Ref Rng & Units 06/07/2021 05/24/2021 05/10/2021  WBC 4.0 - 10.5 K/uL 3.9(L) 8.2 7.8  Hemoglobin 13.0 - 17.0 g/dL 12.3(L) 12.6(L) 12.3(L)  Hematocrit 39.0 - 52.0 % 36.5(L) 37.5(L) 36.8(L)  Platelets 150 - 400 K/uL 118(L) 147(L) 176    CMP Latest Ref Rng & Units 06/07/2021 05/24/2021 05/10/2021  Glucose 70 - 99 mg/dL 75 73 111(H)  BUN 8 - 23 mg/dL _0 Creatinine 0.61 - 1.24 mg/dL 0.75 0.80 0.81  Sodium 135 - 145 mmol/L 135 135 131(L)  Potassium 3.5 - 5.1 mmol/L 3.9 4.0 3.5  Chloride 98 - 111 mmol/L 101 102 94(L)  CO2 22 - 32 mmol/L _1 Calcium 8.9 - 10.3 mg/dL 9.4 9.4 9.5  Total Protein 6.5 - 8.1 g/dL 6.5 6.8 6.8  Total Bilirubin 0.3 - 1.2 mg/dL 0.6 0.4 0.4  Alkaline Phos 38 - 126 U/L 84 97 91  AST 15 - 41 U/L 31 34 20  ALT 0 - 44 U/L _2 RADIOGRAPHIC STUDIES: I have personally reviewed the radiological images as listed and agreed with the findings in the report: FDG avid distal esophagus as well as subcutaneous none lymph node region in the right neck. No results found.  ASSESSMENT & PLAN Justin Trujillo 75 y.o. male with medical history significant for metastatic esophageal adenocarcinoma who presents for a follow up visit. The patient is negative for HER2 but has a high TPS score.  Therefore he is an excellent candidate for FOLFOX with nivolumab.    The regimen of FOLFOX with nivolumab will be administered q 2 weeks with clinic visits prior to each infusion. This will be continued until progression or intolerance.   #Metastatic Adenocarcinoma of the Esophagus #Dysphagia -- Underwent palliative radiation in order to help  alleviate his dysphagia. Patient now tolerating PO well.  --Unfortunately given the spread of this cancer to the skin we will need to treat this as metastatic disease with systemic therapy  -- Final testing on the tissue resulted with HER2 negative status, but TPS score of 60%.  Therefore he would be a good candidate for FOLFOX with nivolumab --Started Cycle 1 Day 1 of FOLFOX plus Nivolumab on 04/26/2021 with good tolerance.  --Labs today reviewed without any intervention. WBC 3.9 Hgb stable at 12.3, Plt 118K. CMP is unremarkable.  --currently due for a restaging CT scan.  --Recommend to proceed with Cycle 4 Day 1 today.  --RTC in 2 weeks for Cycle 5.   #Supportive Care -- chemotherapy education complete -- port placed -- zofran 66m q8H PRN and compazine 145mPO q6H for nausea -- EMLA cream for port -- HOLD on placing PEG tube as he is currently tolerating PO remarkably well.   No orders of the defined types were placed in this encounter.   All questions were answered. The patient knows to call the clinic with any problems, questions or concerns.  I  have spent a total of 30 minutes minutes of face-to-face and non-face-to-face time, preparing to see the patient, performing a medically appropriate examination, counseling and educating the patient, documenting clinical information in the electronic health record, and care coordination.    Orson Slick, MD Department of Hematology/Oncology [place] [facility] Phone: [phone]  06/07/2021 10:47 AM

## 2021-06-09 ENCOUNTER — Inpatient Hospital Stay: Payer: PPO

## 2021-06-09 ENCOUNTER — Other Ambulatory Visit: Payer: Self-pay

## 2021-06-09 VITALS — BP 106/77 | HR 95 | Temp 98.8°F | Resp 20

## 2021-06-09 DIAGNOSIS — C155 Malignant neoplasm of lower third of esophagus: Secondary | ICD-10-CM

## 2021-06-09 DIAGNOSIS — Z5111 Encounter for antineoplastic chemotherapy: Secondary | ICD-10-CM | POA: Diagnosis not present

## 2021-06-09 MED ORDER — SODIUM CHLORIDE 0.9% FLUSH
10.0000 mL | INTRAVENOUS | Status: DC | PRN
  Administered 2021-06-09: 10 mL

## 2021-06-09 MED ORDER — HEPARIN SOD (PORK) LOCK FLUSH 100 UNIT/ML IV SOLN
500.0000 [IU] | Freq: Once | INTRAVENOUS | Status: AC | PRN
  Administered 2021-06-09: 500 [IU]

## 2021-06-13 DIAGNOSIS — E291 Testicular hypofunction: Secondary | ICD-10-CM | POA: Diagnosis not present

## 2021-06-13 DIAGNOSIS — E785 Hyperlipidemia, unspecified: Secondary | ICD-10-CM | POA: Diagnosis not present

## 2021-06-13 DIAGNOSIS — F324 Major depressive disorder, single episode, in partial remission: Secondary | ICD-10-CM | POA: Diagnosis not present

## 2021-06-13 DIAGNOSIS — Z7984 Long term (current) use of oral hypoglycemic drugs: Secondary | ICD-10-CM | POA: Diagnosis not present

## 2021-06-13 DIAGNOSIS — C159 Malignant neoplasm of esophagus, unspecified: Secondary | ICD-10-CM | POA: Diagnosis not present

## 2021-06-13 DIAGNOSIS — I10 Essential (primary) hypertension: Secondary | ICD-10-CM | POA: Diagnosis not present

## 2021-06-13 DIAGNOSIS — G8929 Other chronic pain: Secondary | ICD-10-CM | POA: Diagnosis not present

## 2021-06-13 DIAGNOSIS — E1121 Type 2 diabetes mellitus with diabetic nephropathy: Secondary | ICD-10-CM | POA: Diagnosis not present

## 2021-06-13 DIAGNOSIS — Z23 Encounter for immunization: Secondary | ICD-10-CM | POA: Diagnosis not present

## 2021-06-14 ENCOUNTER — Other Ambulatory Visit: Payer: Self-pay | Admitting: Neurology

## 2021-06-14 NOTE — Progress Notes (Signed)
Assessment/Plan:    1.  Essential Tremor  -don't want him to use xanax for tremor control  -increase primidone to 50 mg bid  2.  AdenoCA of the esophagus with mets to skin  -on chemo  -caused sig dysphagia - did palliative radiation  3.  Acute/subacute L3 fx  -following with pain management and dr. Ellene Route Subjective:   Justin Trujillo was seen today in follow up for essential tremor.  My previous records were reviewed prior to todays visit. Patient was started on primidone last visit.  We also discussed that his tremor was likely made worse by GAD/PTSD and he would need separate treatment for that.  We also discussed last visit that if at all possible, I certainly would recommend tapering the twice daily Xanax and trying to treat anxiety with something that can be used more safely on a long-term basis in this age group.  Patient remains on the combination of twice daily Xanax and oxycodone.  PDMP is reviewed.  Since our last visit, the patient was diagnosed with esophageal adenocarcinoma.  This was ultimately diagnosed after being referred to dermatology for skin biopsy, which showed adenocarcinoma of unknown origin.  He was referred to oncology for further work-up, and it was ultimately determined that he had esophageal adenocarcinoma.  This cancer has caused dysphagia.  They have been doing radiation because of that.  He is also on folfox and nivolumab.  Having lots of back pain, which is being explored by other physicians via MRI last week.  Saw Dr. Ellene Route.  Has acute fx at L3.  Has f/u with pain management next week.  Tremor has been worse.  Primidone did help, but not enough.  Ran out of med about a day ago.  Worse in the afternoons/evenings.  Often will take xanax to help the tremor.      ALLERGIES:   Allergies  Allergen Reactions   Iodinated Diagnostic Agents Anaphylaxis   Moxifloxacin Swelling    REACTION: GI upset, throat "tightened up"   Penicillins Anaphylaxis    Did it  involve swelling of the face/tongue/throat, SOB, or low BP? Yes Did it involve sudden or severe rash/hives, skin peeling, or any reaction on the inside of your mouth or nose? No Did you need to seek medical attention at a hospital or doctor's office? Yes When did it last happen?   been a while     If all above answers are "NO", may proceed with cephalosporin use.    Shellfish-Derived Products Anaphylaxis   Amoxicillin Other (See Comments)   Other Other (See Comments)   Latex Rash    Over long periods of time    CURRENT MEDICATIONS:  Outpatient Encounter Medications as of 06/21/2021  Medication Sig   acetaminophen (TYLENOL) 500 MG tablet Take 650 mg by mouth every 8 (eight) hours as needed (pain).   albuterol (VENTOLIN HFA) 108 (90 Base) MCG/ACT inhaler SMARTSIG:1 Puff(s) Via Inhaler Every 4 Hours PRN   ALPRAZolam (XANAX) 0.5 MG tablet Take 0.5 mg by mouth 2 (two) times daily.   aspirin EC 81 MG tablet Take 81 mg by mouth daily. (Patient not taking: Reported on 04/26/2021)   atorvastatin (LIPITOR) 40 MG tablet Take 1 tablet (40 mg total) by mouth daily.   Calcium Carb-Cholecalciferol (CALCIUM 600+D3 PO) Take 1 tablet by mouth daily.   calcium citrate-vitamin D (CITRACAL+D) 315-200 MG-UNIT tablet daily in the afternoon. (Patient not taking: Reported on 03/07/2021)   Cholecalciferol (VITAMIN D) 50 MCG (2000  UT) tablet Take 2,000 Units by mouth daily.   diazepam (VALIUM) 5 MG tablet 1 PO 60  minutes prior to procedure with 2nd dose 15-20 min prior if needed   escitalopram (LEXAPRO) 10 MG tablet Take 10 mg by mouth daily.   gabapentin (NEURONTIN) 300 MG capsule Take 2 capsules (600 mg total) by mouth 3 (three) times daily. (Patient taking differently: Take 300 mg by mouth 3 (three) times daily.)   lidocaine-prilocaine (EMLA) cream Apply 1 application topically as needed.   lisinopril (ZESTRIL) 5 MG tablet Take 5 mg by mouth daily.   Multiple Vitamins-Minerals (CENTRUM SILVER) tablet daily in the  afternoon.   MYRBETRIQ 50 MG TB24 tablet Take 50 mg by mouth daily.   ondansetron (ZOFRAN) 8 MG tablet Take 1 tablet (8 mg total) by mouth every 8 (eight) hours as needed for nausea or vomiting.   oxyCODONE-acetaminophen (PERCOCET) 10-325 MG tablet Take 1 tablet by mouth every 6 (six) hours as needed.   prochlorperazine (COMPAZINE) 10 MG tablet Take 1 tablet (10 mg total) by mouth every 6 (six) hours as needed for nausea or vomiting.   rOPINIRole (REQUIP) 0.5 MG tablet Take 0.5 mg by mouth at bedtime.   Semaglutide,0.25 or 0.5MG /DOS, (OZEMPIC, 0.25 OR 0.5 MG/DOSE,) 2 MG/1.5ML SOPN Ozempic 0.25 mg or 0.5 mg (2 mg/1.5 mL) subcutaneous pen injector  INJECT 0.5 MG SUBCUTANEOUSLY ONCE WEEKLY   sulfamethoxazole-trimethoprim (BACTRIM DS) 800-160 MG tablet Take 1 tablet by mouth 2 (two) times daily.   tamsulosin (FLOMAX) 0.4 MG CAPS capsule Take 0.4 mg by mouth daily.   Testosterone 20.25 MG/ACT (1.62%) GEL testosterone 20.25 mg/1.25 gram (1.62 %) transdermal gel pump  APPLY ONE PUMP TO EACH SHOULDER DAILY   tiZANidine (ZANAFLEX) 4 MG tablet SMARTSIG:1 Tablet(s) By Mouth 1 to 3 Times Daily PRN   trolamine salicylate (ASPERCREME) 10 % cream Apply 1 application topically 2 (two) times daily as needed for muscle pain.    [DISCONTINUED] lisinopril (ZESTRIL) 10 MG tablet Take 10 mg by mouth daily.   [DISCONTINUED] metFORMIN (GLUCOPHAGE) 1000 MG tablet Take 1 tablet (1,000 mg total) by mouth 2 (two) times daily with a meal. (Patient not taking: Reported on 06/21/2021)   No facility-administered encounter medications on file as of 06/21/2021.     Objective:    PHYSICAL EXAMINATION:    VITALS:   Vitals:   06/21/21 0936  BP: 135/68  Pulse: 85  SpO2: 97%  Weight: 181 lb (82.1 kg)  Height: 5\' 6"  (1.676 m)    GEN:  The patient appears stated age and is in NAD. HEENT:  Normocephalic, atraumatic.  The mucous membranes are moist. The superficial temporal arteries are without ropiness or tenderness. CV:   RRR Lungs:  CTAB Neck/HEME:  There are no carotid bruits bilaterally.  Neurological examination:  Orientation: The patient is alert and oriented x3. Cranial nerves: There is good facial symmetry. The speech is fluent and clear. Soft palate rises symmetrically and there is no tongue deviation. Hearing is decreased to conversational tone. Sensation: Sensation is intact to light touch throughout Motor: Strength is at least antigravity x4.  Movement examination: Tone: There is normal tone in the UE/LE Abnormal movements: no rest tremor.  Mild postural tremor.  Mild intention tremor bilaterally.  very minimal trouble with archimedes spirals bilaterally Coordination:  There is no decremation with RAM's, with any form of RAMS, including alternating supination and pronation of the forearm, hand opening and closing, finger taps, heel taps and toe taps. Gait and Station:  The patient has antalgic gait (wearing back brace) I have reviewed and interpreted the following labs independently   Chemistry      Component Value Date/Time   NA 135 06/07/2021 0816   NA 133 (L) 01/10/2021 0959   K 3.9 06/07/2021 0816   CL 101 06/07/2021 0816   CO2 24 06/07/2021 0816   BUN 11 06/07/2021 0816   BUN 16 01/10/2021 0959   CREATININE 0.75 06/07/2021 0816   CREATININE 0.76 01/14/2012 0750      Component Value Date/Time   CALCIUM 9.4 06/07/2021 0816   ALKPHOS 84 06/07/2021 0816   AST 31 06/07/2021 0816   ALT 29 06/07/2021 0816   BILITOT 0.6 06/07/2021 0816      Lab Results  Component Value Date   WBC 3.9 (L) 06/07/2021   HGB 12.3 (L) 06/07/2021   HCT 36.5 (L) 06/07/2021   MCV 91.7 06/07/2021   PLT 118 (L) 06/07/2021   Lab Results  Component Value Date   TSH 0.691 05/10/2021     Chemistry      Component Value Date/Time   NA 135 06/07/2021 0816   NA 133 (L) 01/10/2021 0959   K 3.9 06/07/2021 0816   CL 101 06/07/2021 0816   CO2 24 06/07/2021 0816   BUN 11 06/07/2021 0816   BUN 16 01/10/2021  0959   CREATININE 0.75 06/07/2021 0816   CREATININE 0.76 01/14/2012 0750      Component Value Date/Time   CALCIUM 9.4 06/07/2021 0816   ALKPHOS 84 06/07/2021 0816   AST 31 06/07/2021 0816   ALT 29 06/07/2021 0816   BILITOT 0.6 06/07/2021 0816       Cc:  Carol Ada, MD

## 2021-06-15 ENCOUNTER — Ambulatory Visit
Admission: RE | Admit: 2021-06-15 | Discharge: 2021-06-15 | Disposition: A | Discharge status: Home / Self Care | Payer: PPO | Source: Ambulatory Visit | Attending: Neurosurgery | Admitting: Neurosurgery

## 2021-06-15 DIAGNOSIS — M545 Low back pain, unspecified: Secondary | ICD-10-CM | POA: Diagnosis not present

## 2021-06-15 DIAGNOSIS — M5136 Other intervertebral disc degeneration, lumbar region: Secondary | ICD-10-CM

## 2021-06-15 DIAGNOSIS — M48061 Spinal stenosis, lumbar region without neurogenic claudication: Secondary | ICD-10-CM | POA: Diagnosis not present

## 2021-06-21 ENCOUNTER — Encounter: Payer: Self-pay | Admitting: Neurology

## 2021-06-21 ENCOUNTER — Other Ambulatory Visit: Payer: Self-pay

## 2021-06-21 ENCOUNTER — Ambulatory Visit: Payer: PPO | Admitting: Neurology

## 2021-06-21 ENCOUNTER — Ambulatory Visit (INDEPENDENT_AMBULATORY_CARE_PROVIDER_SITE_OTHER): Payer: PPO | Admitting: Neurology

## 2021-06-21 VITALS — BP 135/68 | HR 85 | Ht 66.0 in | Wt 181.0 lb

## 2021-06-21 DIAGNOSIS — G250 Essential tremor: Secondary | ICD-10-CM

## 2021-06-21 MED ORDER — PRIMIDONE 50 MG PO TABS: 50.0000 mg | ORAL_TABLET | Freq: Two times a day (BID) | ORAL | 2 refills | Status: AC

## 2021-06-21 NOTE — Patient Instructions (Addendum)
Increase primidone to 50 mg twice per day.  Essential Tremor A tremor is trembling or shaking that a person cannot control. Most tremors affect the hands or arms. Tremors can also affect the head, vocal cords, legs, and other parts of the body. Essential tremor is a tremor without a known cause. Usually, it occurs while a person is trying to perform an action. It tends to get worse gradually as a person ages. What are the causes? The cause of this condition is not known. What increases the risk? You are more likely to develop this condition if: You have a family member with essential tremor. You are age 41 or older. You take certain medicines. What are the signs or symptoms? The main sign of a tremor is a rhythmic shaking of certain parts of your body that is uncontrolled and unintentional. You may: Have difficulty eating with a spoon or fork. Have difficulty writing. Nod your head up and down or side to side. Have a quivering voice. The shaking may: Get worse over time. Come and go. Be more noticeable on one side of your body. Get worse due to stress, fatigue, caffeine, and extreme heat or cold. How is this diagnosed? This condition may be diagnosed based on: Your symptoms and medical history. A physical exam. There is no single test to diagnose an essential tremor. However, your health care provider may order tests to rule out other causes of your condition. These may include: Blood and urine tests. Imaging studies of your brain, such as CT scan and MRI. A test that measures involuntary muscle movement (electromyogram). How is this treated? Treatment for essential tremor depends on the severity of the condition. Some tremors may go away without treatment. Mild tremors may not need treatment if they do not affect your day-to-day life. Severe tremors may need to be treated using one or more of the following options: Medicines. Lifestyle changes. Occupational or physical  therapy. Follow these instructions at home: Lifestyle  Do not use any products that contain nicotine or tobacco, such as cigarettes and e-cigarettes. If you need help quitting, ask your health care provider. Limit your caffeine intake as told by your health care provider. Try to get 8 hours of sleep each night. Find ways to manage your stress that fits your lifestyle and personality. Consider trying meditation or yoga. Try to anticipate stressful situations and allow extra time to manage them. If you are struggling emotionally with the effects of your tremor, consider working with a mental health provider. General instructions Take over-the-counter and prescription medicines only as told by your health care provider. Avoid extreme heat and extreme cold. Keep all follow-up visits as told by your health care provider. This is important. Visits may include physical therapy visits. Contact a health care provider if: You experience any changes in the location or intensity of your tremors. You start having a tremor after starting a new medicine. You have tremor with other symptoms, such as: Numbness. Tingling. Pain. Weakness. Your tremor gets worse. Your tremor interferes with your daily life. You feel down, blue, or sad for at least 2 weeks in a row. Worrying about your tremor and what other people think about you interferes with your everyday life functions, including relationships, work, or school. Summary Essential tremor is a tremor without a known cause. Usually, it occurs when you are trying to perform an action. You are more likely to develop this condition if you have a family member with essential tremor. The main  sign of a tremor is a rhythmic shaking of certain parts of your body that is uncontrolled and unintentional. Treatment for essential tremor depends on the severity of the condition. This information is not intended to replace advice given to you by your health care  provider. Make sure you discuss any questions you have with your health care provider. Document Revised: 05/27/2020 Document Reviewed: 05/27/2020 Elsevier Patient Education  2022 [place] physicians and staff at [facility] are committed to providing excellent care. You may receive a survey requesting feedback about your experience at our office. We strive to receive "very good" responses to the survey questions. If you feel that your experience would prevent you from giving the office a "very good " response, please contact our office to try to remedy the situation. We may be reached at [phone]. Thank you for taking the time out of your busy day to complete the survey.

## 2021-06-22 ENCOUNTER — Inpatient Hospital Stay: Payer: PPO | Attending: Hematology and Oncology

## 2021-06-22 ENCOUNTER — Inpatient Hospital Stay: Payer: PPO

## 2021-06-22 ENCOUNTER — Inpatient Hospital Stay (HOSPITAL_BASED_OUTPATIENT_CLINIC_OR_DEPARTMENT_OTHER): Payer: PPO | Admitting: Hematology and Oncology

## 2021-06-22 VITALS — BP 114/77 | HR 92 | Temp 98.0°F | Resp 18 | Wt 176.2 lb

## 2021-06-22 DIAGNOSIS — Z923 Personal history of irradiation: Secondary | ICD-10-CM | POA: Insufficient documentation

## 2021-06-22 DIAGNOSIS — Z95828 Presence of other vascular implants and grafts: Secondary | ICD-10-CM | POA: Diagnosis not present

## 2021-06-22 DIAGNOSIS — R634 Abnormal weight loss: Secondary | ICD-10-CM | POA: Insufficient documentation

## 2021-06-22 DIAGNOSIS — Z79630 Long term (current) use of alkylating agent: Secondary | ICD-10-CM | POA: Insufficient documentation

## 2021-06-22 DIAGNOSIS — C159 Malignant neoplasm of esophagus, unspecified: Secondary | ICD-10-CM

## 2021-06-22 DIAGNOSIS — C792 Secondary malignant neoplasm of skin: Secondary | ICD-10-CM | POA: Diagnosis not present

## 2021-06-22 DIAGNOSIS — R5383 Other fatigue: Secondary | ICD-10-CM | POA: Diagnosis not present

## 2021-06-22 DIAGNOSIS — Z5111 Encounter for antineoplastic chemotherapy: Secondary | ICD-10-CM | POA: Insufficient documentation

## 2021-06-22 DIAGNOSIS — R197 Diarrhea, unspecified: Secondary | ICD-10-CM | POA: Diagnosis not present

## 2021-06-22 DIAGNOSIS — Z79899 Other long term (current) drug therapy: Secondary | ICD-10-CM | POA: Insufficient documentation

## 2021-06-22 DIAGNOSIS — R1310 Dysphagia, unspecified: Secondary | ICD-10-CM | POA: Insufficient documentation

## 2021-06-22 DIAGNOSIS — Z79631 Long term (current) use of antimetabolite agent: Secondary | ICD-10-CM | POA: Diagnosis not present

## 2021-06-22 DIAGNOSIS — C155 Malignant neoplasm of lower third of esophagus: Secondary | ICD-10-CM | POA: Insufficient documentation

## 2021-06-22 DIAGNOSIS — Z931 Gastrostomy status: Secondary | ICD-10-CM | POA: Diagnosis not present

## 2021-06-22 DIAGNOSIS — F17210 Nicotine dependence, cigarettes, uncomplicated: Secondary | ICD-10-CM | POA: Diagnosis not present

## 2021-06-22 DIAGNOSIS — R1319 Other dysphagia: Secondary | ICD-10-CM

## 2021-06-22 DIAGNOSIS — M549 Dorsalgia, unspecified: Secondary | ICD-10-CM | POA: Diagnosis not present

## 2021-06-22 DIAGNOSIS — Z5112 Encounter for antineoplastic immunotherapy: Secondary | ICD-10-CM | POA: Diagnosis not present

## 2021-06-22 DIAGNOSIS — C153 Malignant neoplasm of upper third of esophagus: Secondary | ICD-10-CM | POA: Diagnosis not present

## 2021-06-22 DIAGNOSIS — G620 Drug-induced polyneuropathy: Secondary | ICD-10-CM | POA: Insufficient documentation

## 2021-06-22 LAB — CBC WITH DIFFERENTIAL (CANCER CENTER ONLY)
Abs Immature Granulocytes: 0.01 10*3/uL (ref 0.00–0.07)
Basophils Absolute: 0 10*3/uL (ref 0.0–0.1)
Basophils Relative: 1 %
Eosinophils Absolute: 0.1 10*3/uL (ref 0.0–0.5)
Eosinophils Relative: 2 %
HCT: 36.8 % — ABNORMAL LOW (ref 39.0–52.0)
Hemoglobin: 12.3 g/dL — ABNORMAL LOW (ref 13.0–17.0)
Immature Granulocytes: 0 %
Lymphocytes Relative: 18 %
Lymphs Abs: 0.5 10*3/uL — ABNORMAL LOW (ref 0.7–4.0)
MCH: 31.6 pg (ref 26.0–34.0)
MCHC: 33.4 g/dL (ref 30.0–36.0)
MCV: 94.6 fL (ref 80.0–100.0)
Monocytes Absolute: 0.4 10*3/uL (ref 0.1–1.0)
Monocytes Relative: 15 %
Neutro Abs: 1.6 10*3/uL — ABNORMAL LOW (ref 1.7–7.7)
Neutrophils Relative %: 64 %
Platelet Count: 140 10*3/uL — ABNORMAL LOW (ref 150–400)
RBC: 3.89 MIL/uL — ABNORMAL LOW (ref 4.22–5.81)
RDW: 20.3 % — ABNORMAL HIGH (ref 11.5–15.5)
WBC Count: 2.6 10*3/uL — ABNORMAL LOW (ref 4.0–10.5)
nRBC: 0 % (ref 0.0–0.2)

## 2021-06-22 LAB — CMP (CANCER CENTER ONLY)
ALT: 36 U/L (ref 0–44)
AST: 43 U/L — ABNORMAL HIGH (ref 15–41)
Albumin: 3.2 g/dL — ABNORMAL LOW (ref 3.5–5.0)
Alkaline Phosphatase: 89 U/L (ref 38–126)
Anion gap: 10 (ref 5–15)
BUN: 10 mg/dL (ref 8–23)
CO2: 25 mmol/L (ref 22–32)
Calcium: 9.3 mg/dL (ref 8.9–10.3)
Chloride: 103 mmol/L (ref 98–111)
Creatinine: 0.81 mg/dL (ref 0.61–1.24)
GFR, Estimated: >60 mL/min (ref >60)
Glucose, Bld: 81 mg/dL (ref 70–99)
Potassium: 3.7 mmol/L (ref 3.5–5.1)
Sodium: 138 mmol/L (ref 135–145)
Total Bilirubin: 0.4 mg/dL (ref 0.3–1.2)
Total Protein: 6.1 g/dL — ABNORMAL LOW (ref 6.5–8.1)

## 2021-06-22 MED ORDER — PALONOSETRON HCL INJECTION 0.25 MG/5ML
0.2500 mg | Freq: Once | INTRAVENOUS | Status: AC
  Administered 2021-06-22: 0.25 mg via INTRAVENOUS
  Filled 2021-06-22: qty 5

## 2021-06-22 MED ORDER — OXALIPLATIN CHEMO INJECTION 100 MG/20ML
85.0000 mg/m2 | Freq: Once | INTRAVENOUS | Status: AC
  Administered 2021-06-22: 170 mg via INTRAVENOUS
  Filled 2021-06-22: qty 34

## 2021-06-22 MED ORDER — SODIUM CHLORIDE 0.9 % IV SOLN
10.0000 mg | Freq: Once | INTRAVENOUS | Status: AC
  Administered 2021-06-22: 10 mg via INTRAVENOUS
  Filled 2021-06-22: qty 10

## 2021-06-22 MED ORDER — DEXTROSE 5 % IV SOLN
Freq: Once | INTRAVENOUS | Status: AC
Start: 2021-06-22 — End: 2021-06-22

## 2021-06-22 MED ORDER — SODIUM CHLORIDE 0.9% FLUSH
10.0000 mL | Freq: Once | INTRAVENOUS | Status: AC
  Administered 2021-06-22: 10 mL

## 2021-06-22 MED ORDER — FLUOROURACIL CHEMO INJECTION 5 GM/100ML
2475.0000 mg/m2 | INTRAVENOUS | Status: DC
  Administered 2021-06-22: 5000 mg via INTRAVENOUS
  Filled 2021-06-22: qty 100

## 2021-06-22 MED ORDER — FLUOROURACIL CHEMO INJECTION 2.5 GM/50ML
400.0000 mg/m2 | Freq: Once | INTRAVENOUS | Status: AC
  Administered 2021-06-22: 800 mg via INTRAVENOUS
  Filled 2021-06-22: qty 16

## 2021-06-22 MED ORDER — LEUCOVORIN CALCIUM INJECTION 350 MG
400.0000 mg/m2 | Freq: Once | INTRAVENOUS | Status: AC
  Administered 2021-06-22: 808 mg via INTRAVENOUS
  Filled 2021-06-22: qty 40.4

## 2021-06-22 MED ORDER — SODIUM CHLORIDE 0.9% FLUSH
10.0000 mL | INTRAVENOUS | Status: DC | PRN
Start: 2021-06-22 — End: 2021-06-22

## 2021-06-22 MED ORDER — SODIUM CHLORIDE 0.9 % IV SOLN
240.0000 mg | Freq: Once | INTRAVENOUS | Status: AC
  Administered 2021-06-22: 240 mg via INTRAVENOUS
  Filled 2021-06-22: qty 24

## 2021-06-22 NOTE — Patient Instructions (Signed)
[facility]  Discharge Instructions: Thank you for choosing [facility] to provide your oncology and hematology care.   If you have a lab appointment with the [facility], please go directly to the [facility] and check in at the registration area.   Wear comfortable clothing and clothing appropriate for easy access to any Portacath or PICC line.   We strive to give you quality time with your provider. You may need to reschedule your appointment if you arrive late (15 or more minutes).  Arriving late affects you and other patients whose appointments are after yours.  Also, if you miss three or more appointments without notifying the office, you may be dismissed from the clinic at the provider's discretion.      For prescription refill requests, have your pharmacy contact our office and allow 72 hours for refills to be completed.    Today you received the following chemotherapy and/or immunotherapy agents Nivolumab, Oxaliplatin, Leucovorin, and 5FU Push & Continuous.      To help prevent nausea and vomiting after your treatment, we encourage you to take your nausea medication as directed.  BELOW ARE SYMPTOMS THAT SHOULD BE REPORTED IMMEDIATELY: *FEVER GREATER THAN 100.4 F (38 C) OR HIGHER *CHILLS OR SWEATING *NAUSEA AND VOMITING THAT IS NOT CONTROLLED WITH YOUR NAUSEA MEDICATION *UNUSUAL SHORTNESS OF BREATH *UNUSUAL BRUISING OR BLEEDING *URINARY PROBLEMS (pain or burning when urinating, or frequent urination) *BOWEL PROBLEMS (unusual diarrhea, constipation, pain near the anus) TENDERNESS IN MOUTH AND THROAT WITH OR WITHOUT PRESENCE OF ULCERS (sore throat, sores in mouth, or a toothache) UNUSUAL RASH, SWELLING OR PAIN  UNUSUAL VAGINAL DISCHARGE OR ITCHING   Items with * indicate a potential emergency and should be followed up as soon as possible or go to the Emergency Department if any problems should occur.  Please show the CHEMOTHERAPY  ALERT CARD or IMMUNOTHERAPY ALERT CARD at check-in to the Emergency Department and triage nurse.  Should you have questions after your visit or need to cancel or reschedule your appointment, please contact [facility]  Dept: [phone]  and follow the prompts.  Office hours are 8:00 a.m. to 4:30 p.m. Monday - Friday. Please note that voicemails left after 4:00 p.m. may not be returned until the following business day.  We are closed weekends and major holidays. You have access to a nurse at all times for urgent questions. Please call the main number to the clinic Dept: [phone] and follow the prompts.   For any non-urgent questions, you may also contact your provider using MyChart. We now offer e-Visits for anyone 46 and older to request care online for non-urgent symptoms. For details visit [email or web address].   Also download the MyChart app! Go to the app store, search "MyChart", open the app, select [facility], and log in with your MyChart username and password.  Due to Covid, a mask is required upon entering the hospital/clinic. If you do not have a mask, one will be given to you upon arrival. For doctor visits, patients may have 1 support person aged 98 or older with them. For treatment visits, patients cannot have anyone with them due to current Covid guidelines and our immunocompromised population.

## 2021-06-22 NOTE — Progress Notes (Signed)
[facility] [place] [phone]   [phone]  PROGRESS NOTE  Patient Care Team: Carol Ada, MD as PCP - General (Family Medicine) Evans Lance, MD as PCP - Cardiology (Cardiology) Orson Slick, MD as Consulting Physician (Hematology and Oncology)  Hematological/Oncological History # Metastatic Adenocarcinoma of the Distal Esophagus 01/27/2021: dermatology performed a punch biopsy of the right superior lateral anterior neck. Finding show adenocarcinoma negative for PSA and cytokeratin 20.  02/06/2021: establish care with Dr. Lorenso Courier  02/15/2021: EGD found esophageal mass. Biopsy confirms poorly differentiated carcinoma 02/22/2021: PET CT scan shows FDG avid mass in distal esophagus and subcutaneous tissue or right neck.  03/22/2021-04/05/2021: palliative radiation to the esophageal mass.  04/26/2021: Cycle 1 Day 1 of FOLFOX + Nivolumab 05/10/2021: Cycle 2 Day 1 of FOLFOX + Nivolumab 05/24/2021: Cycle 3 Day 1 of FOLFOX + Nivolumab 06/07/2021: Cycle 4 Day 1 of FOLFOX + Nivolumab 06/23/2021: Cycle 5 Day 1 of FOLFOX + Nivolumab  Interval History:  Justin Trujillo 75 y.o. male with medical history significant for metastatic esophageal adenocarcinoma who presents for a follow up visit. The patient's last visit was on 06/07/2021. In the interim since the last visit he has completed Cycle 4  of FOLFOX plus Nivolumab.   On exam today Justin Trujillo reports he has been well overall in the interim since our last visit.  He has lost approximately 5 pounds but notes that his swallowing has been good with exception of a few foods such as pastas.  He notes also if he eats too fast or does not chew well enough he tends to have difficulty.  He does have "a little bit of diarrhea" but has also struggled with constipation.  This has been somewhat of a balancing act for him.  He also recently has been having more pain with his back and imaging shows no evidence of metastatic disease though  severe degenerative changes.  He notes that the major side effect he has been experiencing has been fatigue and cold sensitivity.  He denies fevers, chills, night sweats, shortness of breath, chest pain or cough. He has no other complaints. Rest of the 10 point ROS is below.  MEDICAL HISTORY:  Past Medical History:  Diagnosis Date   Arthritis    Coronary atherosclerosis of native coronary artery    Depression    Essential tremor    High cholesterol    Hypertension    Hypertension    Hypogonadism male    Obstructive chronic bronchitis with exacerbation (HCC)    Obstructive sleep apnea (adult) (pediatric)    no cpap   Other and unspecified hyperlipidemia    Other emphysema ([place])    Proteinuria 2019   has seen a nephrologist   Shortness of breath    with excertion   Situational stress    Type II or unspecified type diabetes mellitus without mention of complication, not stated as uncontrolled    type 2   Unspecified essential hypertension     SURGICAL HISTORY: Past Surgical History:  Procedure Laterality Date   CARDIAC CATHETERIZATION     IR IMAGING GUIDED PORT INSERTION  04/24/2021   IR KYPHO LUMBAR INC FX REDUCE BONE BX UNI/BIL CANNULATION INC/IMAGING  06/13/2018   L lens implant     R knee replacement x2     REVERSE SHOULDER ARTHROPLASTY Left 03/26/2019   Procedure: REVERSE SHOULDER ARTHROPLASTY;  Surgeon: Justice Britain, MD;  Location: [facility];  Service: Orthopedics;  Laterality: Left;  171mn  TONSILLECTOMY     TONSILLECTOMY AND ADENOIDECTOMY     vascectomy      SOCIAL HISTORY: Social History   Socioeconomic History   Marital status: Married    Spouse name: Not on file   Number of children: Not on file   Years of education: Not on file   Highest education level: Not on file  Occupational History   Occupation: Landscape architect  Tobacco Use   Smoking status: Every Day    Packs/day: 1.00    Years: 55.00    Pack years: 55.00    Types: Cigarettes    Smokeless tobacco: Never  Vaping Use   Vaping Use: Never used  Substance and Sexual Activity   Alcohol use: Yes    Alcohol/week: 1.0 standard drink    Types: 1 Cans of beer per week    Comment: occasional beer   Drug use: Never   Sexual activity: Not Currently  Other Topics Concern   Not on file  Social History Narrative   Not on file   Social Determinants of Health   Financial Resource Strain: Not on file  Food Insecurity: Not on file  Transportation Needs: Not on file  Physical Activity: Not on file  Stress: Not on file  Social Connections: Not on file  Intimate Partner Violence: Not on file    FAMILY HISTORY: Family History  Problem Relation Age of Onset   Emphysema Father    Heart disease Father    Clotting disorder Mother    Kidney cancer Child     ALLERGIES:  is allergic to iodinated diagnostic agents, moxifloxacin, penicillins, shellfish-derived products, amoxicillin, other, and latex.  MEDICATIONS:  Current Outpatient Medications  Medication Sig Dispense Refill   acetaminophen (TYLENOL) 500 MG tablet Take 650 mg by mouth every 8 (eight) hours as needed (pain).     albuterol (VENTOLIN HFA) 108 (90 Base) MCG/ACT inhaler SMARTSIG:1 Puff(s) Via Inhaler Every 4 Hours PRN     ALPRAZolam (XANAX) 0.5 MG tablet Take 0.5 mg by mouth 2 (two) times daily.     aspirin EC 81 MG tablet Take 81 mg by mouth daily. (Patient not taking: Reported on 04/26/2021)     atorvastatin (LIPITOR) 40 MG tablet Take 1 tablet (40 mg total) by mouth daily. 30 tablet 0   Calcium Carb-Cholecalciferol (CALCIUM 600+D3 PO) Take 1 tablet by mouth daily.     Cholecalciferol (VITAMIN D) 50 MCG (2000 UT) tablet Take 2,000 Units by mouth daily.     escitalopram (LEXAPRO) 10 MG tablet Take 10 mg by mouth daily.     gabapentin (NEURONTIN) 300 MG capsule Take 2 capsules (600 mg total) by mouth 3 (three) times daily. (Patient taking differently: Take 300 mg by mouth 3 (three) times daily.) 90 capsule 5    lidocaine-prilocaine (EMLA) cream Apply 1 application topically as needed. 30 g 0   lisinopril (ZESTRIL) 5 MG tablet Take 5 mg by mouth daily.     Multiple Vitamins-Minerals (CENTRUM SILVER) tablet daily in the afternoon.     MYRBETRIQ 50 MG TB24 tablet Take 50 mg by mouth daily.     ondansetron (ZOFRAN) 8 MG tablet Take 1 tablet (8 mg total) by mouth every 8 (eight) hours as needed for nausea or vomiting. 20 tablet 0   oxyCODONE-acetaminophen (PERCOCET) 10-325 MG tablet Take 1 tablet by mouth every 6 (six) hours as needed.     primidone (MYSOLINE) 50 MG tablet Take 1 tablet (50 mg total) by mouth in the morning  and at bedtime. 180 tablet 2   prochlorperazine (COMPAZINE) 10 MG tablet Take 1 tablet (10 mg total) by mouth every 6 (six) hours as needed for nausea or vomiting. 30 tablet 0   rOPINIRole (REQUIP) 0.5 MG tablet Take 0.5 mg by mouth at bedtime.     Semaglutide,0.25 or 0.5MG/DOS, (OZEMPIC, 0.25 OR 0.5 MG/DOSE,) 2 MG/1.5ML SOPN Ozempic 0.25 mg or 0.5 mg (2 mg/1.5 mL) subcutaneous pen injector  INJECT 0.5 MG SUBCUTANEOUSLY ONCE WEEKLY     sulfamethoxazole-trimethoprim (BACTRIM DS) 800-160 MG tablet Take 1 tablet by mouth 2 (two) times daily.     tamsulosin (FLOMAX) 0.4 MG CAPS capsule Take 0.4 mg by mouth daily.     Testosterone 20.25 MG/ACT (1.62%) GEL testosterone 20.25 mg/1.25 gram (1.62 %) transdermal gel pump  APPLY ONE PUMP TO EACH SHOULDER DAILY     tiZANidine (ZANAFLEX) 4 MG tablet SMARTSIG:1 Tablet(s) By Mouth 1 to 3 Times Daily PRN     trolamine salicylate (ASPERCREME) 10 % cream Apply 1 application topically 2 (two) times daily as needed for muscle pain.      No current facility-administered medications for this visit.    REVIEW OF SYSTEMS:   Constitutional: ( - ) fevers, ( - )  chills , ( - ) night sweats Eyes: ( - ) blurriness of vision, ( - ) double vision, ( - ) watery eyes Ears, nose, mouth, throat, and face: ( - ) mucositis, ( - ) sore throat Respiratory: ( - ) cough, (  - ) dyspnea, ( - ) wheezes Cardiovascular: ( - ) palpitation, ( - ) chest discomfort, ( - ) lower extremity swelling Gastrointestinal:  ( - ) nausea, ( - ) heartburn, ( - ) change in bowel habits Skin: ( - ) abnormal skin rashes Lymphatics: ( - ) new lymphadenopathy, ( - ) easy bruising Neurological: ( - ) numbness, ( - ) tingling, ( - ) new weaknesses Behavioral/Psych: ( - ) mood change, ( - ) new changes  All other systems were reviewed with the patient and are negative.  PHYSICAL EXAMINATION: ECOG PERFORMANCE STATUS: 1 - Symptomatic but completely ambulatory  Vitals:   06/22/21 0843  BP: 114/77  Pulse: 92  Resp: 18  Temp: 98 F (36.7 C)  SpO2: 97%   Filed Weights   06/22/21 0843  Weight: 176 lb 3 oz (79.9 kg)    GENERAL: Well-appearing elderly Caucasian male, alert, no distress and comfortable SKIN: skin color, texture, turgor are normal, no rashes or significant lesions EYES: conjunctiva are pink and non-injected, sclera clear LUNGS: clear to auscultation and percussion with normal breathing effort HEART: regular rate & rhythm and no murmurs and no lower extremity edema PSYCH: alert & oriented x 3, fluent speech NEURO: no focal motor/sensory deficits  LABORATORY DATA:  I have reviewed the data as listed CBC Latest Ref Rng & Units 06/22/2021 06/07/2021 05/24/2021  WBC 4.0 - 10.5 K/uL 2.6(L) 3.9(L) 8.2  Hemoglobin 13.0 - 17.0 g/dL 12.3(L) 12.3(L) 12.6(L)  Hematocrit 39.0 - 52.0 % 36.8(L) 36.5(L) 37.5(L)  Platelets 150 - 400 K/uL 140(L) 118(L) 147(L)    CMP Latest Ref Rng & Units 06/22/2021 06/07/2021 05/24/2021  Glucose 70 - 99 mg/dL 81 75 73  BUN 8 - 23 mg/dL _0 Creatinine 0.61 - 1.24 mg/dL 0.81 0.75 0.80  Sodium 135 - 145 mmol/L 138 135 135  Potassium 3.5 - 5.1 mmol/L 3.7 3.9 4.0  Chloride 98 - 111 mmol/L 103 101 102  CO2  22 - 32 mmol/L _0 Calcium 8.9 - 10.3 mg/dL 9.3 9.4 9.4  Total Protein 6.5 - 8.1 g/dL 6.1(L) 6.5 6.8  Total Bilirubin 0.3 - 1.2 mg/dL  0.4 0.6 0.4  Alkaline Phos 38 - 126 U/L 89 84 97  AST 15 - 41 U/L 43(H) 31 34  ALT 0 - 44 U/L 36 29 29    RADIOGRAPHIC STUDIES: I have personally reviewed the radiological images as listed and agreed with the findings in the report: FDG avid distal esophagus as well as subcutaneous none lymph node region in the right neck. MR LUMBAR SPINE WO CONTRAST  Result Date: 06/18/2021 CLINICAL DATA:  Lumbar degenerative disc disease. Chronic low back pain occasionally extending into the left groin. EXAM: MRI LUMBAR SPINE WITHOUT CONTRAST TECHNIQUE: Multiplanar, multisequence MR imaging of the lumbar spine was performed. No intravenous contrast was administered. COMPARISON:  MRI of the lumbar spine 05/13/2018 at [facility]. CT of the lumbar spine 06/26/2019 at [facility]. FINDINGS: Segmentation: 5 non rib-bearing lumbar type vertebral bodies are present. The lowest fully formed vertebral body is L5. Alignment: Slight retrolisthesis at L2-3 is stable. Minimal anterolisthesis at L1-2 is stable. Rightward curvature is centered at L2, similar the prior exams. Vertebrae: Diffuse fatty infiltration noted at the fused level, L3-4. Mixed fatty and edematous changes are present at L1-2, left greater than right. Edematous changes are present on the left at L2-3. Vertebral body heights are maintained. Normal signal is present at L5. T2 signal at the S3 level is consistent with a fracture. No sacral ala fractures are evident. Conus medullaris and cauda equina: Conus extends to the L1-2 level. Conus and cauda equina appear normal. Paraspinal and other soft tissues: Limited imaging the abdomen is unremarkable. There is no significant adenopathy. No solid organ lesions are present. Disc levels: T12-L1: Moderate facet hypertrophy is noted bilaterally. Rightward disc protrusion is present. Moderate right and mild left foraminal narrowing is present. L1-2: A broad-based disc protrusion present. Moderate facet hypertrophy is  worse left than right. Mild left subarticular narrowing is present. Moderate left and mild right foraminal narrowing is present. L2-3: A broad-based disc protrusion is present. Moderate facet hypertrophy is worse on the left. Mild left subarticular narrowing is present. Moderate left and mild right foraminal narrowing is present. L3-4: Solid fusion is present. The central canal and foramina are decompressed. L4-5: A broad-based disc protrusion is asymmetric to the right. Moderate facet hypertrophy is worse on the right. Mild right subarticular narrowing is present. Moderate to severe right foraminal narrowing is present. Mild left foraminal narrowing is present. L5-S1: Advanced facet hypertrophy is worse on the left. Moderate facet hypertrophy is noted bilaterally. No significant compressive stenosis is present. IMPRESSION: 1. Acute/subacute fracture at S3.  No sacral ala fractures evident. 2. Solid fusion at L3-4 without residual or recurrent stenosis. 3. Moderate right and mild left foraminal stenosis at T12-L1. 4. Mild left subarticular and moderate left foraminal stenosis at L1-2 and L2-3. 5. Mild right subarticular narrowing with moderate to severe right foraminal stenosis at L4-5. 6. Mild left foraminal narrowing at L4-5. 7. Advanced facet hypertrophy is worse on the left at L5-S1 without significant stenosis. Electronically Signed   By: San Morelle M.D.   On: 06/18/2021 08:21    ASSESSMENT & PLAN Justin Trujillo 75 y.o. male with medical history significant for metastatic esophageal adenocarcinoma who presents for a follow up visit. The patient is negative for HER2 but has a high TPS score.  Therefore he is an excellent candidate for FOLFOX with nivolumab.    The regimen of FOLFOX with nivolumab will be administered q 2 weeks with clinic visits prior to each infusion. This will be continued until progression or intolerance.   #Metastatic Adenocarcinoma of the Esophagus #Dysphagia -- Underwent  palliative radiation in order to help alleviate his dysphagia. Patient now tolerating PO well.  --Unfortunately given the spread of this cancer to the skin we will need to treat this as metastatic disease with systemic therapy  -- Final testing on the tissue resulted with HER2 negative status, but TPS score of 60%.  Therefore he would be a good candidate for FOLFOX with nivolumab --Started Cycle 1 Day 1 of FOLFOX plus Nivolumab on 04/26/2021 with good tolerance.  --Labs today reviewed without any intervention. WBC 2.6 Hgb stable at 12.3, Plt 140K. CMP is unremarkable.  --currently due for a restaging CT scan.  --Recommend to proceed with Cycle 5 Day 1 today.  --RTC in 2 weeks for Cycle 6.   #Supportive Care -- chemotherapy education complete -- port placed -- zofran 46m q8H PRN and compazine 165mPO q6H for nausea -- EMLA cream for port -- HOLD on placing PEG tube as he is currently tolerating PO remarkably well.   No orders of the defined types were placed in this encounter.   All questions were answered. The patient knows to call the clinic with any problems, questions or concerns.  I have spent a total of 30 minutes minutes of face-to-face and non-face-to-face time, preparing to see the patient, performing a medically appropriate examination, counseling and educating the patient, documenting clinical information in the electronic health record, and care coordination.    JoOrson SlickMD Department of Hematology/Oncology [facility]one: [phone]/02/2021 9:14 AM

## 2021-06-24 ENCOUNTER — Inpatient Hospital Stay: Payer: PPO

## 2021-06-24 ENCOUNTER — Other Ambulatory Visit: Payer: Self-pay

## 2021-06-24 VITALS — BP 109/68 | HR 100 | Temp 97.1°F | Resp 18

## 2021-06-24 DIAGNOSIS — C155 Malignant neoplasm of lower third of esophagus: Secondary | ICD-10-CM

## 2021-06-24 DIAGNOSIS — Z5111 Encounter for antineoplastic chemotherapy: Secondary | ICD-10-CM | POA: Diagnosis not present

## 2021-06-24 MED ORDER — SODIUM CHLORIDE 0.9% FLUSH
10.0000 mL | INTRAVENOUS | Status: DC | PRN
Start: 2021-06-24 — End: 2021-06-24
  Administered 2021-06-24: 10 mL

## 2021-06-24 MED ORDER — HEPARIN SOD (PORK) LOCK FLUSH 100 UNIT/ML IV SOLN
500.0000 [IU] | Freq: Once | INTRAVENOUS | Status: AC | PRN
  Administered 2021-06-24: 500 [IU]

## 2021-06-25 ENCOUNTER — Encounter: Payer: Self-pay | Admitting: Hematology and Oncology

## 2021-06-26 DIAGNOSIS — C153 Malignant neoplasm of upper third of esophagus: Secondary | ICD-10-CM | POA: Diagnosis not present

## 2021-06-28 DIAGNOSIS — E1121 Type 2 diabetes mellitus with diabetic nephropathy: Secondary | ICD-10-CM | POA: Diagnosis not present

## 2021-06-28 DIAGNOSIS — E7800 Pure hypercholesterolemia, unspecified: Secondary | ICD-10-CM | POA: Diagnosis not present

## 2021-06-28 DIAGNOSIS — G4700 Insomnia, unspecified: Secondary | ICD-10-CM | POA: Diagnosis not present

## 2021-06-28 DIAGNOSIS — N181 Chronic kidney disease, stage 1: Secondary | ICD-10-CM | POA: Diagnosis not present

## 2021-06-28 DIAGNOSIS — E785 Hyperlipidemia, unspecified: Secondary | ICD-10-CM | POA: Diagnosis not present

## 2021-06-28 DIAGNOSIS — F324 Major depressive disorder, single episode, in partial remission: Secondary | ICD-10-CM | POA: Diagnosis not present

## 2021-06-28 DIAGNOSIS — I10 Essential (primary) hypertension: Secondary | ICD-10-CM | POA: Diagnosis not present

## 2021-06-28 DIAGNOSIS — G8929 Other chronic pain: Secondary | ICD-10-CM | POA: Diagnosis not present

## 2021-06-29 ENCOUNTER — Encounter: Payer: Self-pay | Admitting: Neurology

## 2021-07-05 ENCOUNTER — Inpatient Hospital Stay (HOSPITAL_BASED_OUTPATIENT_CLINIC_OR_DEPARTMENT_OTHER): Payer: PPO | Admitting: Hematology and Oncology

## 2021-07-05 ENCOUNTER — Inpatient Hospital Stay: Payer: PPO

## 2021-07-05 ENCOUNTER — Other Ambulatory Visit: Payer: Self-pay | Admitting: Hematology and Oncology

## 2021-07-05 ENCOUNTER — Inpatient Hospital Stay: Payer: PPO | Admitting: Dietician

## 2021-07-05 ENCOUNTER — Other Ambulatory Visit: Payer: Self-pay

## 2021-07-05 VITALS — BP 133/92 | HR 85 | Temp 97.2°F | Resp 18 | Wt 175.3 lb

## 2021-07-05 DIAGNOSIS — C159 Malignant neoplasm of esophagus, unspecified: Secondary | ICD-10-CM | POA: Diagnosis not present

## 2021-07-05 DIAGNOSIS — Z95828 Presence of other vascular implants and grafts: Secondary | ICD-10-CM

## 2021-07-05 DIAGNOSIS — Z5111 Encounter for antineoplastic chemotherapy: Secondary | ICD-10-CM | POA: Diagnosis not present

## 2021-07-05 DIAGNOSIS — C153 Malignant neoplasm of upper third of esophagus: Secondary | ICD-10-CM | POA: Diagnosis not present

## 2021-07-05 DIAGNOSIS — C155 Malignant neoplasm of lower third of esophagus: Secondary | ICD-10-CM

## 2021-07-05 LAB — CMP (CANCER CENTER ONLY)
ALT: 14 U/L (ref 0–44)
AST: 23 U/L (ref 15–41)
Albumin: 2.9 g/dL — ABNORMAL LOW (ref 3.5–5.0)
Alkaline Phosphatase: 83 U/L (ref 38–126)
Anion gap: 10 (ref 5–15)
BUN: 11 mg/dL (ref 8–23)
CO2: 24 mmol/L (ref 22–32)
Calcium: 8.9 mg/dL (ref 8.9–10.3)
Chloride: 101 mmol/L (ref 98–111)
Creatinine: 0.84 mg/dL (ref 0.61–1.24)
GFR, Estimated: >60 mL/min (ref >60)
Glucose, Bld: 69 mg/dL — ABNORMAL LOW (ref 70–99)
Potassium: 3.5 mmol/L (ref 3.5–5.1)
Sodium: 135 mmol/L (ref 135–145)
Total Bilirubin: 0.4 mg/dL (ref 0.3–1.2)
Total Protein: 6.1 g/dL — ABNORMAL LOW (ref 6.5–8.1)

## 2021-07-05 LAB — CBC WITH DIFFERENTIAL (CANCER CENTER ONLY)
Abs Immature Granulocytes: 0.01 10*3/uL (ref 0.00–0.07)
Basophils Absolute: 0 10*3/uL (ref 0.0–0.1)
Basophils Relative: 1 %
Eosinophils Absolute: 0 10*3/uL (ref 0.0–0.5)
Eosinophils Relative: 1 %
HCT: 33.6 % — ABNORMAL LOW (ref 39.0–52.0)
Hemoglobin: 11.4 g/dL — ABNORMAL LOW (ref 13.0–17.0)
Immature Granulocytes: 0 %
Lymphocytes Relative: 9 %
Lymphs Abs: 0.4 10*3/uL — ABNORMAL LOW (ref 0.7–4.0)
MCH: 32 pg (ref 26.0–34.0)
MCHC: 33.9 g/dL (ref 30.0–36.0)
MCV: 94.4 fL (ref 80.0–100.0)
Monocytes Absolute: 0.5 10*3/uL (ref 0.1–1.0)
Monocytes Relative: 11 %
Neutro Abs: 3.4 10*3/uL (ref 1.7–7.7)
Neutrophils Relative %: 78 %
Platelet Count: 126 10*3/uL — ABNORMAL LOW (ref 150–400)
RBC: 3.56 MIL/uL — ABNORMAL LOW (ref 4.22–5.81)
RDW: 20.6 % — ABNORMAL HIGH (ref 11.5–15.5)
WBC Count: 4.4 10*3/uL (ref 4.0–10.5)
nRBC: 0 % (ref 0.0–0.2)

## 2021-07-05 LAB — MAGNESIUM: Magnesium: 1.6 mg/dL — ABNORMAL LOW (ref 1.7–2.4)

## 2021-07-05 MED ORDER — SODIUM CHLORIDE 0.9 % IV SOLN
10.0000 mg | Freq: Once | INTRAVENOUS | Status: AC
  Administered 2021-07-05: 10 mg via INTRAVENOUS
  Filled 2021-07-05: qty 10

## 2021-07-05 MED ORDER — SODIUM CHLORIDE 0.9% FLUSH
10.0000 mL | Freq: Once | INTRAVENOUS | Status: AC
  Administered 2021-07-05: 10 mL

## 2021-07-05 MED ORDER — FLUOROURACIL CHEMO INJECTION 2.5 GM/50ML
400.0000 mg/m2 | Freq: Once | INTRAVENOUS | Status: AC
  Administered 2021-07-05: 800 mg via INTRAVENOUS
  Filled 2021-07-05: qty 16

## 2021-07-05 MED ORDER — OXALIPLATIN CHEMO INJECTION 100 MG/20ML
85.0000 mg/m2 | Freq: Once | INTRAVENOUS | Status: AC
  Administered 2021-07-05: 170 mg via INTRAVENOUS
  Filled 2021-07-05: qty 34

## 2021-07-05 MED ORDER — SODIUM CHLORIDE 0.9 % IV SOLN
240.0000 mg | Freq: Once | INTRAVENOUS | Status: AC
  Administered 2021-07-05: 240 mg via INTRAVENOUS
  Filled 2021-07-05: qty 24

## 2021-07-05 MED ORDER — PALONOSETRON HCL INJECTION 0.25 MG/5ML
0.2500 mg | Freq: Once | INTRAVENOUS | Status: AC
  Administered 2021-07-05: 0.25 mg via INTRAVENOUS
  Filled 2021-07-05: qty 5

## 2021-07-05 MED ORDER — DEXTROSE 5 % IV SOLN: Freq: Once | INTRAVENOUS | Status: AC

## 2021-07-05 MED ORDER — SODIUM CHLORIDE 0.9 % IV SOLN
2475.0000 mg/m2 | INTRAVENOUS | Status: DC
  Administered 2021-07-05: 5000 mg via INTRAVENOUS
  Filled 2021-07-05: qty 100

## 2021-07-05 MED ORDER — DEXTROSE 5 % IV SOLN
400.0000 mg/m2 | Freq: Once | INTRAVENOUS | Status: AC
  Administered 2021-07-05: 808 mg via INTRAVENOUS
  Filled 2021-07-05: qty 40.4

## 2021-07-05 NOTE — Progress Notes (Signed)
Nutrition Follow-up:  Patient with metastatic esophageal cancer. He has completed palliative radiation therapy on 7/20. Patient currently receiving FOLFOX, Nivolumab  Met with patient during infusion. He reports swallowing has greatly improved, eating "anything" he wants. He is eating a variety of foods (Bills pizza, beef tips/rice, red beans and rice, oatmeal, breakfast bars, pasta) He continues to drink Boost VHC, tries to drink 2/day but this does not always happen. Patient reports cold sensitivity lasting ~1 week. He would prefer to drink cold supplements. Patient reports decreased appetite, states he gets distracted and forgets to eat sometimes. Patient recalls forgetting to eat oatmeal for breakfast yesterday after he was distracted fixing the deep freezer. Patient reports he had planned to go to subway before treatment, but he ran out of time. Patient agreeable to eating sandwich during infusion.   Medications: Primidone, Percocet, Zestril  Labs: Glucose 69, Mg 1.6  Anthropometrics: Weight 175 lb 4.8 oz today stable x 2 weeks. Patient weighed 176 lb 3 oz on 10/6 decreased from 178 lb on 9/21 and 181 lb 12.8 oz on 9/7    NUTRITION DIAGNOSIS: Inadequate oral intake ongoing   INTERVENTION:  Educated on importance of not skipping meals and encouraged high protein snacks in between meals for improved glucose control Discussed strategies for poor appetite, suggested pt set meal/snack time alarms  Continue drinking 1-2 Boost VHC daily for added calories and protein Offered warm supplement ideas, pt politely declined recipes  Patient has contact information    MONITORING, EVALUATION, GOAL: weight trends, intake   NEXT VISIT: Wednesday November 16 during infusion

## 2021-07-05 NOTE — Patient Instructions (Signed)
[facility]   Discharge Instructions: Thank you for choosing [facility] to provide your oncology and hematology care.   If you have a lab appointment with the [facility], please go directly to the [facility] and check in at the registration area.   Wear comfortable clothing and clothing appropriate for easy access to any Portacath or PICC line.   We strive to give you quality time with your provider. You may need to reschedule your appointment if you arrive late (15 or more minutes).  Arriving late affects you and other patients whose appointments are after yours.  Also, if you miss three or more appointments without notifying the office, you may be dismissed from the clinic at the provider's discretion.      For prescription refill requests, have your pharmacy contact our office and allow 72 hours for refills to be completed.    Today you received the following chemotherapy and/or immunotherapy agents: nivolumab, oxaliplatin, leucovorin, fluorouracil.      To help prevent nausea and vomiting after your treatment, we encourage you to take your nausea medication as directed.  BELOW ARE SYMPTOMS THAT SHOULD BE REPORTED IMMEDIATELY: *FEVER GREATER THAN 100.4 F (38 C) OR HIGHER *CHILLS OR SWEATING *NAUSEA AND VOMITING THAT IS NOT CONTROLLED WITH YOUR NAUSEA MEDICATION *UNUSUAL SHORTNESS OF BREATH *UNUSUAL BRUISING OR BLEEDING *URINARY PROBLEMS (pain or burning when urinating, or frequent urination) *BOWEL PROBLEMS (unusual diarrhea, constipation, pain near the anus) TENDERNESS IN MOUTH AND THROAT WITH OR WITHOUT PRESENCE OF ULCERS (sore throat, sores in mouth, or a toothache) UNUSUAL RASH, SWELLING OR PAIN  UNUSUAL VAGINAL DISCHARGE OR ITCHING   Items with * indicate a potential emergency and should be followed up as soon as possible or go to the Emergency Department if any problems should occur.  Please show the CHEMOTHERAPY ALERT CARD  or IMMUNOTHERAPY ALERT CARD at check-in to the Emergency Department and triage nurse.  Should you have questions after your visit or need to cancel or reschedule your appointment, please contact [facility]  Dept: [phone]  and follow the prompts.  Office hours are 8:00 a.m. to 4:30 p.m. Monday - Friday. Please note that voicemails left after 4:00 p.m. may not be returned until the following business day.  We are closed weekends and major holidays. You have access to a nurse at all times for urgent questions. Please call the main number to the clinic Dept: [phone] and follow the prompts.   For any non-urgent questions, you may also contact your provider using MyChart. We now offer e-Visits for anyone 27 and older to request care online for non-urgent symptoms. For details visit [email or web address].   Also download the MyChart app! Go to the app store, search "MyChart", open the app, select [facility], and log in with your MyChart username and password.  Due to Covid, a mask is required upon entering the hospital/clinic. If you do not have a mask, one will be given to you upon arrival. For doctor visits, patients may have 1 support person aged 15 or older with them. For treatment visits, patients cannot have anyone with them due to current Covid guidelines and our immunocompromised population.

## 2021-07-05 NOTE — Progress Notes (Signed)
[facility] [phone]   [phone]  PROGRESS NOTE  Patient Care Team: Carol Ada, MD as PCP - General (Family Medicine) Evans Lance, MD as PCP - Cardiology (Cardiology) Orson Slick, MD as Consulting Physician (Hematology and Oncology)  Hematological/Oncological History # Metastatic Adenocarcinoma of the Distal Esophagus 01/27/2021: dermatology performed a punch biopsy of the right superior lateral anterior neck. Finding show adenocarcinoma negative for PSA and cytokeratin 20.  02/06/2021: establish care with Dr. Lorenso Courier  02/15/2021: EGD found esophageal mass. Biopsy confirms poorly differentiated carcinoma 02/22/2021: PET CT scan shows FDG avid mass in distal esophagus and subcutaneous tissue or right neck.  03/22/2021-04/05/2021: palliative radiation to the esophageal mass.  04/26/2021: Cycle 1 Day 1 of FOLFOX + Nivolumab 05/10/2021: Cycle 2 Day 1 of FOLFOX + Nivolumab 05/24/2021: Cycle 3 Day 1 of FOLFOX + Nivolumab 06/07/2021: Cycle 4 Day 1 of FOLFOX + Nivolumab 06/23/2021: Cycle 5 Day 1 of FOLFOX + Nivolumab 07/05/2021: Cycle 6 Day 1 of FOLFOX + Nivolumab  Interval History:  Justin Trujillo. 75 y.o. male with medical history significant for metastatic esophageal adenocarcinoma who presents for a follow up visit. The patient's last visit was on 06/23/2021. In the interim since the last visit he has completed Cycle 5  of FOLFOX plus Nivolumab.   On exam today Mr. Hasz reports he has been having difficulty with cold sensitivity after his chemotherapies.  He reports that these tend to last longer and longer after each subsequent cycle.  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  He reports he has not been having any issues with nausea but does have multiple diarrhea after each cycle.  He notes that this tends to be balanced out by his Percocets for back pain.  He reports overall he has "nothing to complain about".  He has continued to lose  weight and is down about 6 pounds for the month.  He reports his back pain keeps him distracted that he does not eat as much as he should.  He also notes that whenever he wants to eat he is able to swallow without difficulty.  He notes that the major side effect he has been experiencing has been fatigue and cold sensitivity.  He denies fevers, chills, night sweats, shortness of breath, chest pain or cough. He has no other complaints. Rest of the 10 point ROS is below.  Additionally today we discussed his CT allergy.  He notes that he has an issue with shellfish and he was told that a contrast agent he was received had shellfish product.  He notes that he developed some trouble breathing which resolved immediately with Benadryl during that scan.  He reports that this was many years ago.  MEDICAL HISTORY:  Past Medical History:  Diagnosis Date   Arthritis    Coronary atherosclerosis of native coronary artery    Depression    Essential tremor    High cholesterol    Hypertension    Hypertension    Hypogonadism male    Obstructive chronic bronchitis with exacerbation (HCC)    Obstructive sleep apnea (adult) (pediatric)    no cpap   Other and unspecified hyperlipidemia    Other emphysema ([place])    Proteinuria 2019   has seen a nephrologist   Shortness of breath    with excertion   Situational stress    Type II or unspecified type diabetes mellitus without mention of complication, not stated as uncontrolled  type 2   Unspecified essential hypertension     SURGICAL HISTORY: Past Surgical History:  Procedure Laterality Date   CARDIAC CATHETERIZATION     IR IMAGING GUIDED PORT INSERTION  04/24/2021   IR KYPHO LUMBAR INC FX REDUCE BONE BX UNI/BIL CANNULATION INC/IMAGING  06/13/2018   L lens implant     R knee replacement x2     REVERSE SHOULDER ARTHROPLASTY Left 03/26/2019   Procedure: REVERSE SHOULDER ARTHROPLASTY;  Surgeon: Justice Britain, MD;  Location: [facility];  Service: Orthopedics;   Laterality: Left;  146mn   TONSILLECTOMY     TONSILLECTOMY AND ADENOIDECTOMY     vascectomy      SOCIAL HISTORY: Social History   Socioeconomic History   Marital status: Married    Spouse name: Not on file   Number of children: Not on file   Years of education: Not on file   Highest education level: Not on file  Occupational History   Occupation: lLandscape architect Tobacco Use   Smoking status: Every Day    Packs/day: 1.00    Years: 55.00    Pack years: 55.00    Types: Cigarettes   Smokeless tobacco: Never  Vaping Use   Vaping Use: Never used  Substance and Sexual Activity   Alcohol use: Yes    Alcohol/week: 1.0 standard drink    Types: 1 Cans of beer per week    Comment: occasional beer   Drug use: Never   Sexual activity: Not Currently  Other Topics Concern   Not on file  Social History Narrative   ** Merged History Encounter **       Social Determinants of Health   Financial Resource Strain: Not on file  Food Insecurity: Not on file  Transportation Needs: Not on file  Physical Activity: Not on file  Stress: Not on file  Social Connections: Not on file  Intimate Partner Violence: Not on file    FAMILY HISTORY: Family History  Problem Relation Age of Onset   Emphysema Father    Heart disease Father    Clotting disorder Mother    Kidney cancer Child     ALLERGIES:  is allergic to iodinated diagnostic agents, moxifloxacin, penicillins, shellfish-derived products, amoxicillin, other, and latex.  MEDICATIONS:  Current Outpatient Medications  Medication Sig Dispense Refill   acetaminophen (TYLENOL) 500 MG tablet Take 650 mg by mouth every 8 (eight) hours as needed (pain).     albuterol (VENTOLIN HFA) 108 (90 Base) MCG/ACT inhaler SMARTSIG:1 Puff(s) Via Inhaler Every 4 Hours PRN     ALPRAZolam (XANAX) 0.5 MG tablet Take 0.5 mg by mouth 2 (two) times daily.     atorvastatin (LIPITOR) 40 MG tablet Take 1 tablet (40 mg total) by mouth daily.  30 tablet 0   Calcium Carb-Cholecalciferol (CALCIUM 600+D3 PO) Take 1 tablet by mouth daily.     Cholecalciferol (VITAMIN D) 50 MCG (2000 UT) tablet Take 2,000 Units by mouth daily.     escitalopram (LEXAPRO) 10 MG tablet Take 10 mg by mouth daily.     gabapentin (NEURONTIN) 300 MG capsule Take 2 capsules (600 mg total) by mouth 3 (three) times daily. (Patient taking differently: Take 300 mg by mouth 3 (three) times daily.) 90 capsule 5   Multiple Vitamins-Minerals (CENTRUM SILVER) tablet daily in the afternoon.     MYRBETRIQ 50 MG TB24 tablet Take 50 mg by mouth daily.     ondansetron (ZOFRAN) 8 MG tablet Take 1 tablet (8 mg total)  by mouth every 8 (eight) hours as needed for nausea or vomiting. 20 tablet 0   oxyCODONE-acetaminophen (PERCOCET) 10-325 MG tablet Take 1 tablet by mouth every 6 (six) hours as needed.     primidone (MYSOLINE) 50 MG tablet Take 1 tablet (50 mg total) by mouth in the morning and at bedtime. 180 tablet 2   prochlorperazine (COMPAZINE) 10 MG tablet Take 1 tablet (10 mg total) by mouth every 6 (six) hours as needed for nausea or vomiting. 30 tablet 0   rOPINIRole (REQUIP) 0.5 MG tablet Take 0.5 mg by mouth at bedtime.     Semaglutide,0.25 or 0.5MG/DOS, (OZEMPIC, 0.25 OR 0.5 MG/DOSE,) 2 MG/1.5ML SOPN Ozempic 0.25 mg or 0.5 mg (2 mg/1.5 mL) subcutaneous pen injector  INJECT 0.5 MG SUBCUTANEOUSLY ONCE WEEKLY     tamsulosin (FLOMAX) 0.4 MG CAPS capsule Take 0.4 mg by mouth daily.     TESTOSTERONE IM Inject 1 mL into the muscle every 14 (fourteen) days.     tiZANidine (ZANAFLEX) 4 MG tablet SMARTSIG:1 Tablet(s) By Mouth 1 to 3 Times Daily PRN     trolamine salicylate (ASPERCREME) 10 % cream Apply 1 application topically 2 (two) times daily as needed for muscle pain.      lidocaine-prilocaine (EMLA) cream Apply 1 application topically as needed. (Patient not taking: Reported on 07/05/2021) 30 g 0   lisinopril (ZESTRIL) 5 MG tablet Take 5 mg by mouth daily. (Patient not taking:  No sig reported)     sulfamethoxazole-trimethoprim (BACTRIM DS) 800-160 MG tablet Take 1 tablet by mouth 2 (two) times daily.     No current facility-administered medications for this visit.    REVIEW OF SYSTEMS:   Constitutional: ( - ) fevers, ( - )  chills , ( - ) night sweats Eyes: ( - ) blurriness of vision, ( - ) double vision, ( - ) watery eyes Ears, nose, mouth, throat, and face: ( - ) mucositis, ( - ) sore throat Respiratory: ( - ) cough, ( - ) dyspnea, ( - ) wheezes Cardiovascular: ( - ) palpitation, ( - ) chest discomfort, ( - ) lower extremity swelling Gastrointestinal:  ( - ) nausea, ( - ) heartburn, ( - ) change in bowel habits Skin: ( - ) abnormal skin rashes Lymphatics: ( - ) new lymphadenopathy, ( - ) easy bruising Neurological: ( - ) numbness, ( - ) tingling, ( - ) new weaknesses Behavioral/Psych: ( - ) mood change, ( - ) new changes  All other systems were reviewed with the patient and are negative.  PHYSICAL EXAMINATION: ECOG PERFORMANCE STATUS: 1 - Symptomatic but completely ambulatory  Vitals:   07/05/21 1113  BP: (!) 133/92  Pulse: 85  Resp: 18  Temp: (!) 97.2 F (36.2 C)  SpO2: 97%   Filed Weights   07/05/21 1113  Weight: 175 lb 4.8 oz (79.5 kg)    GENERAL: Well-appearing elderly Caucasian male, alert, no distress and comfortable SKIN: skin color, texture, turgor are normal, no rashes or significant lesions EYES: conjunctiva are pink and non-injected, sclera clear LUNGS: clear to auscultation and percussion with normal breathing effort HEART: regular rate & rhythm and no murmurs and no lower extremity edema PSYCH: alert & oriented x 3, fluent speech NEURO: no focal motor/sensory deficits  LABORATORY DATA:  I have reviewed the data as listed CBC Latest Ref Rng & Units 07/05/2021 06/22/2021 06/07/2021  WBC 4.0 - 10.5 K/uL 4.4 2.6(L) 3.9(L)  Hemoglobin 13.0 - 17.0 g/dL 11.4(L) 12.3(L) 12.3(L)  Hematocrit 39.0 - 52.0 % 33.6(L) 36.8(L) 36.5(L)   Platelets 150 - 400 K/uL 126(L) 140(L) 118(L)    CMP Latest Ref Rng & Units 07/05/2021 06/22/2021 06/07/2021  Glucose 70 - 99 mg/dL 69(L) 81 75  BUN 8 - 23 mg/dL _0 Creatinine 0.61 - 1.24 mg/dL 0.84 0.81 0.75  Sodium 135 - 145 mmol/L 135 138 135  Potassium 3.5 - 5.1 mmol/L 3.5 3.7 3.9  Chloride 98 - 111 mmol/L 101 103 101  CO2 22 - 32 mmol/L _1 Calcium 8.9 - 10.3 mg/dL 8.9 9.3 9.4  Total Protein 6.5 - 8.1 g/dL 6.1(L) 6.1(L) 6.5  Total Bilirubin 0.3 - 1.2 mg/dL 0.4 0.4 0.6  Alkaline Phos 38 - 126 U/L 83 89 84  AST 15 - 41 U/L 23 43(H) 31  ALT 0 - 44 U/L 14 36 29    RADIOGRAPHIC STUDIES: I have personally reviewed the radiological images as listed and agreed with the findings in the report: FDG avid distal esophagus as well as subcutaneous none lymph node region in the right neck. MR LUMBAR SPINE WO CONTRAST  Result Date: 06/18/2021 CLINICAL DATA:  Lumbar degenerative disc disease. Chronic low back pain occasionally extending into the left groin. EXAM: MRI LUMBAR SPINE WITHOUT CONTRAST TECHNIQUE: Multiplanar, multisequence MR imaging of the lumbar spine was performed. No intravenous contrast was administered. COMPARISON:  MRI of the lumbar spine 05/13/2018 at [facility]. CT of the lumbar spine 06/26/2019 at [facility]. FINDINGS: Segmentation: 5 non rib-bearing lumbar type vertebral bodies are present. The lowest fully formed vertebral body is L5. Alignment: Slight retrolisthesis at L2-3 is stable. Minimal anterolisthesis at L1-2 is stable. Rightward curvature is centered at L2, similar the prior exams. Vertebrae: Diffuse fatty infiltration noted at the fused level, L3-4. Mixed fatty and edematous changes are present at L1-2, left greater than right. Edematous changes are present on the left at L2-3. Vertebral body heights are maintained. Normal signal is present at L5. T2 signal at the S3 level is consistent with a fracture. No sacral ala fractures are evident. Conus  medullaris and cauda equina: Conus extends to the L1-2 level. Conus and cauda equina appear normal. Paraspinal and other soft tissues: Limited imaging the abdomen is unremarkable. There is no significant adenopathy. No solid organ lesions are present. Disc levels: T12-L1: Moderate facet hypertrophy is noted bilaterally. Rightward disc protrusion is present. Moderate right and mild left foraminal narrowing is present. L1-2: A broad-based disc protrusion present. Moderate facet hypertrophy is worse left than right. Mild left subarticular narrowing is present. Moderate left and mild right foraminal narrowing is present. L2-3: A broad-based disc protrusion is present. Moderate facet hypertrophy is worse on the left. Mild left subarticular narrowing is present. Moderate left and mild right foraminal narrowing is present. L3-4: Solid fusion is present. The central canal and foramina are decompressed. L4-5: A broad-based disc protrusion is asymmetric to the right. Moderate facet hypertrophy is worse on the right. Mild right subarticular narrowing is present. Moderate to severe right foraminal narrowing is present. Mild left foraminal narrowing is present. L5-S1: Advanced facet hypertrophy is worse on the left. Moderate facet hypertrophy is noted bilaterally. No significant compressive stenosis is present. IMPRESSION: 1. Acute/subacute fracture at S3.  No sacral ala fractures evident. 2. Solid fusion at L3-4 without residual or recurrent stenosis. 3. Moderate right and mild left foraminal stenosis at T12-L1. 4. Mild left subarticular and moderate left foraminal stenosis at L1-2 and L2-3. 5. Mild right subarticular  narrowing with moderate to severe right foraminal stenosis at L4-5. 6. Mild left foraminal narrowing at L4-5. 7. Advanced facet hypertrophy is worse on the left at L5-S1 without significant stenosis. Electronically Signed   By: San Morelle M.D.   On: 06/18/2021 08:21    ASSESSMENT & PLAN Justin Trujillo  75 y.o. male with medical history significant for metastatic esophageal adenocarcinoma who presents for a follow up visit. The patient is negative for HER2 but has a high TPS score.  Therefore he is an excellent candidate for FOLFOX with nivolumab.    The regimen of FOLFOX with nivolumab will be administered q 2 weeks with clinic visits prior to each infusion. This will be continued until progression or intolerance.   #Metastatic Adenocarcinoma of the Esophagus #Dysphagia -- Underwent palliative radiation in order to help alleviate his dysphagia. Patient now tolerating PO well.  --Unfortunately given the spread of this cancer to the skin we will need to treat this as metastatic disease with systemic therapy  -- Final testing on the tissue resulted with HER2 negative status, but TPS score of 60%.  Therefore he would be a good candidate for FOLFOX with nivolumab --Started Cycle 1 Day 1 of FOLFOX plus Nivolumab on 04/26/2021 with good tolerance.  --Labs today reviewed without any intervention. WBC 4.4 Hgb mild drop at 11.4, Plt 126K. CMP is unremarkable.  --currently due for a restaging CT scan.  --Recommend to proceed with Cycle 6 Day 1 today.  --RTC in 2 weeks for Cycle 7.   #Supportive Care -- chemotherapy education complete -- port placed -- zofran 48m q8H PRN and compazine 155mPO q6H for nausea -- EMLA cream for port -- HOLD on placing PEG tube as he is currently tolerating PO remarkably well.   Orders Placed This Encounter  Procedures   CT Soft Tissue Neck W Contrast    Standing Status:   Future    Standing Expiration Date:   07/05/2022    Order Specific Question:   If indicated for the ordered procedure, I authorize the administration of contrast media per Radiology protocol    Answer:   Yes    Order Specific Question:   [facility]?    [facility]    Standing Status:   Future    Standing Expiration Date:    07/05/2022    Order Specific Question:   [facility]    [facility]  Order Specific Question:   Release to patient    Answer:   Immediate    Order Specific Question:   Is Oral Contrast requested for this exam?    Answer:   Yes, Per Radiology protocol    Order Specific Question:   Reason for Exam (SYMPTOM  OR DIAGNOSIS REQUIRED)    Answer:   esophageal cancer, assess treatment response     All questions were answered. The patient knows to call the clinic with any problems, questions or concerns.  I have spent a total of 30 minutes minutes of face-to-face and non-face-to-face time, preparing to see the patient, performing a medically appropriate examination, counseling and educating the patient, documenting clinical information in the electronic health record, and care coordination.   JoLedell PeoplesMD Department of Hematology/Oncology [facility] [facility]: [phone]: [phone]: [email or web address]  07/06/2021 10:10 AM

## 2021-07-06 ENCOUNTER — Encounter: Payer: Self-pay | Admitting: Hematology and Oncology

## 2021-07-07 ENCOUNTER — Inpatient Hospital Stay: Payer: PPO

## 2021-07-07 ENCOUNTER — Other Ambulatory Visit: Payer: Self-pay

## 2021-07-07 VITALS — BP 136/79 | HR 95 | Temp 98.4°F | Resp 20

## 2021-07-07 DIAGNOSIS — Z95828 Presence of other vascular implants and grafts: Secondary | ICD-10-CM

## 2021-07-07 DIAGNOSIS — Z5111 Encounter for antineoplastic chemotherapy: Secondary | ICD-10-CM | POA: Diagnosis not present

## 2021-07-07 DIAGNOSIS — C155 Malignant neoplasm of lower third of esophagus: Secondary | ICD-10-CM

## 2021-07-07 MED ORDER — SODIUM CHLORIDE 0.9% FLUSH
10.0000 mL | INTRAVENOUS | Status: DC | PRN
Start: 2021-07-07 — End: 2021-07-07
  Administered 2021-07-07: 10 mL

## 2021-07-07 MED ORDER — HEPARIN SOD (PORK) LOCK FLUSH 100 UNIT/ML IV SOLN
500.0000 [IU] | Freq: Once | INTRAVENOUS | Status: AC | PRN
  Administered 2021-07-07: 500 [IU]

## 2021-07-07 MED ORDER — SODIUM CHLORIDE 0.9% FLUSH: 10.0000 mL | Freq: Once | INTRAVENOUS | Status: DC

## 2021-07-07 MED ORDER — HEPARIN SOD (PORK) LOCK FLUSH 100 UNIT/ML IV SOLN: 500.0000 [IU] | Freq: Once | INTRAVENOUS | Status: DC

## 2021-07-12 ENCOUNTER — Other Ambulatory Visit: Payer: Self-pay | Admitting: *Deleted

## 2021-07-12 ENCOUNTER — Telehealth: Payer: Self-pay | Admitting: *Deleted

## 2021-07-12 MED ORDER — PREDNISONE 50 MG PO TABS
ORAL_TABLET | ORAL | 0 refills | Status: DC
Start: 2021-07-12 — End: 2021-10-27

## 2021-07-12 NOTE — Telephone Encounter (Signed)
TCT patient regarding allergy prep prior to his CT scan tomorrow. Advised that I have called in his Prednisone 50 mg to be taken 13 hours, 7 hours and 1 hour prior to his on 07/13/21 @ 3pm. He is to also take Benadryl 50 mg 1 hour prior to scan. Advised to call back @ [phone] with any questions.

## 2021-07-13 ENCOUNTER — Ambulatory Visit (HOSPITAL_COMMUNITY)
Admission: RE | Admit: 2021-07-13 | Discharge: 2021-07-13 | Disposition: A | Discharge status: Home / Self Care | Payer: PPO | Source: Ambulatory Visit | Attending: Hematology and Oncology | Admitting: Hematology and Oncology

## 2021-07-13 ENCOUNTER — Ambulatory Visit (HOSPITAL_COMMUNITY): Payer: PPO

## 2021-07-13 ENCOUNTER — Other Ambulatory Visit: Payer: Self-pay

## 2021-07-13 DIAGNOSIS — T451X5A Adverse effect of antineoplastic and immunosuppressive drugs, initial encounter: Secondary | ICD-10-CM | POA: Diagnosis not present

## 2021-07-13 DIAGNOSIS — C159 Malignant neoplasm of esophagus, unspecified: Secondary | ICD-10-CM | POA: Insufficient documentation

## 2021-07-13 DIAGNOSIS — I4819 Other persistent atrial fibrillation: Secondary | ICD-10-CM | POA: Diagnosis not present

## 2021-07-13 DIAGNOSIS — R000 Tachycardia, unspecified: Secondary | ICD-10-CM | POA: Diagnosis not present

## 2021-07-13 DIAGNOSIS — G4733 Obstructive sleep apnea (adult) (pediatric): Secondary | ICD-10-CM | POA: Diagnosis not present

## 2021-07-13 DIAGNOSIS — R739 Hyperglycemia, unspecified: Secondary | ICD-10-CM | POA: Diagnosis not present

## 2021-07-13 DIAGNOSIS — N281 Cyst of kidney, acquired: Secondary | ICD-10-CM | POA: Diagnosis not present

## 2021-07-13 DIAGNOSIS — E1142 Type 2 diabetes mellitus with diabetic polyneuropathy: Secondary | ICD-10-CM | POA: Diagnosis not present

## 2021-07-13 DIAGNOSIS — M47814 Spondylosis without myelopathy or radiculopathy, thoracic region: Secondary | ICD-10-CM | POA: Diagnosis not present

## 2021-07-13 DIAGNOSIS — C155 Malignant neoplasm of lower third of esophagus: Secondary | ICD-10-CM | POA: Diagnosis not present

## 2021-07-13 DIAGNOSIS — R112 Nausea with vomiting, unspecified: Secondary | ICD-10-CM | POA: Diagnosis not present

## 2021-07-13 DIAGNOSIS — E1169 Type 2 diabetes mellitus with other specified complication: Secondary | ICD-10-CM | POA: Diagnosis not present

## 2021-07-13 DIAGNOSIS — R599 Enlarged lymph nodes, unspecified: Secondary | ICD-10-CM | POA: Diagnosis not present

## 2021-07-13 DIAGNOSIS — Z79899 Other long term (current) drug therapy: Secondary | ICD-10-CM | POA: Diagnosis not present

## 2021-07-13 DIAGNOSIS — R1111 Vomiting without nausea: Secondary | ICD-10-CM | POA: Diagnosis not present

## 2021-07-13 DIAGNOSIS — D72828 Other elevated white blood cell count: Secondary | ICD-10-CM | POA: Diagnosis not present

## 2021-07-13 DIAGNOSIS — G250 Essential tremor: Secondary | ICD-10-CM | POA: Diagnosis not present

## 2021-07-13 DIAGNOSIS — J438 Other emphysema: Secondary | ICD-10-CM | POA: Diagnosis not present

## 2021-07-13 DIAGNOSIS — K5730 Diverticulosis of large intestine without perforation or abscess without bleeding: Secondary | ICD-10-CM | POA: Diagnosis not present

## 2021-07-13 DIAGNOSIS — I10 Essential (primary) hypertension: Secondary | ICD-10-CM | POA: Diagnosis not present

## 2021-07-13 DIAGNOSIS — I480 Paroxysmal atrial fibrillation: Secondary | ICD-10-CM | POA: Diagnosis not present

## 2021-07-13 DIAGNOSIS — E872 Acidosis, unspecified: Secondary | ICD-10-CM | POA: Diagnosis not present

## 2021-07-13 DIAGNOSIS — Z20822 Contact with and (suspected) exposure to covid-19: Secondary | ICD-10-CM | POA: Diagnosis not present

## 2021-07-13 DIAGNOSIS — I501 Left ventricular failure: Secondary | ICD-10-CM | POA: Diagnosis not present

## 2021-07-13 DIAGNOSIS — J8410 Pulmonary fibrosis, unspecified: Secondary | ICD-10-CM | POA: Diagnosis not present

## 2021-07-13 DIAGNOSIS — E785 Hyperlipidemia, unspecified: Secondary | ICD-10-CM | POA: Diagnosis not present

## 2021-07-13 DIAGNOSIS — E860 Dehydration: Secondary | ICD-10-CM | POA: Diagnosis not present

## 2021-07-13 DIAGNOSIS — R0789 Other chest pain: Secondary | ICD-10-CM | POA: Diagnosis not present

## 2021-07-13 DIAGNOSIS — I110 Hypertensive heart disease with heart failure: Secondary | ICD-10-CM | POA: Diagnosis not present

## 2021-07-13 DIAGNOSIS — I4891 Unspecified atrial fibrillation: Secondary | ICD-10-CM | POA: Diagnosis not present

## 2021-07-13 DIAGNOSIS — K219 Gastro-esophageal reflux disease without esophagitis: Secondary | ICD-10-CM | POA: Diagnosis not present

## 2021-07-13 DIAGNOSIS — I5031 Acute diastolic (congestive) heart failure: Secondary | ICD-10-CM | POA: Diagnosis not present

## 2021-07-13 DIAGNOSIS — N179 Acute kidney failure, unspecified: Secondary | ICD-10-CM | POA: Diagnosis not present

## 2021-07-13 DIAGNOSIS — I2510 Atherosclerotic heart disease of native coronary artery without angina pectoris: Secondary | ICD-10-CM | POA: Diagnosis not present

## 2021-07-13 DIAGNOSIS — E871 Hypo-osmolality and hyponatremia: Secondary | ICD-10-CM | POA: Diagnosis not present

## 2021-07-13 DIAGNOSIS — I472 Ventricular tachycardia, unspecified: Secondary | ICD-10-CM | POA: Diagnosis not present

## 2021-07-13 DIAGNOSIS — R0902 Hypoxemia: Secondary | ICD-10-CM | POA: Diagnosis not present

## 2021-07-13 DIAGNOSIS — I4892 Unspecified atrial flutter: Secondary | ICD-10-CM | POA: Diagnosis not present

## 2021-07-13 DIAGNOSIS — D696 Thrombocytopenia, unspecified: Secondary | ICD-10-CM | POA: Diagnosis not present

## 2021-07-13 DIAGNOSIS — Z66 Do not resuscitate: Secondary | ICD-10-CM | POA: Diagnosis not present

## 2021-07-13 DIAGNOSIS — F32A Depression, unspecified: Secondary | ICD-10-CM | POA: Diagnosis not present

## 2021-07-13 DIAGNOSIS — E7800 Pure hypercholesterolemia, unspecified: Secondary | ICD-10-CM | POA: Diagnosis not present

## 2021-07-13 DIAGNOSIS — D61810 Antineoplastic chemotherapy induced pancytopenia: Secondary | ICD-10-CM | POA: Diagnosis not present

## 2021-07-13 DIAGNOSIS — R079 Chest pain, unspecified: Secondary | ICD-10-CM | POA: Diagnosis not present

## 2021-07-13 DIAGNOSIS — F17210 Nicotine dependence, cigarettes, uncomplicated: Secondary | ICD-10-CM | POA: Diagnosis not present

## 2021-07-13 DIAGNOSIS — M47812 Spondylosis without myelopathy or radiculopathy, cervical region: Secondary | ICD-10-CM | POA: Diagnosis not present

## 2021-07-13 DIAGNOSIS — Z96651 Presence of right artificial knee joint: Secondary | ICD-10-CM | POA: Diagnosis not present

## 2021-07-13 MED ORDER — HEPARIN SOD (PORK) LOCK FLUSH 100 UNIT/ML IV SOLN
500.0000 [IU] | Freq: Once | INTRAVENOUS | Status: AC
  Administered 2021-07-13: 500 [IU] via INTRAVENOUS

## 2021-07-13 MED ORDER — IOHEXOL 350 MG/ML SOLN
100.0000 mL | Freq: Once | INTRAVENOUS | Status: AC | PRN
  Administered 2021-07-13: 80 mL via INTRAVENOUS

## 2021-07-15 ENCOUNTER — Other Ambulatory Visit: Payer: Self-pay

## 2021-07-15 ENCOUNTER — Encounter (HOSPITAL_COMMUNITY): Payer: Self-pay | Admitting: *Deleted

## 2021-07-15 ENCOUNTER — Inpatient Hospital Stay (HOSPITAL_COMMUNITY)
Admission: EM | Admit: 2021-07-15 | Discharge: 2021-07-21 | DRG: 391 | Disposition: A | Discharge status: Home / Self Care | Payer: PPO | Attending: Internal Medicine | Admitting: Internal Medicine

## 2021-07-15 DIAGNOSIS — Z66 Do not resuscitate: Secondary | ICD-10-CM | POA: Diagnosis present

## 2021-07-15 DIAGNOSIS — Z8249 Family history of ischemic heart disease and other diseases of the circulatory system: Secondary | ICD-10-CM

## 2021-07-15 DIAGNOSIS — I5031 Acute diastolic (congestive) heart failure: Secondary | ICD-10-CM | POA: Diagnosis present

## 2021-07-15 DIAGNOSIS — I493 Ventricular premature depolarization: Secondary | ICD-10-CM | POA: Diagnosis present

## 2021-07-15 DIAGNOSIS — Z91040 Latex allergy status: Secondary | ICD-10-CM

## 2021-07-15 DIAGNOSIS — F419 Anxiety disorder, unspecified: Secondary | ICD-10-CM | POA: Diagnosis present

## 2021-07-15 DIAGNOSIS — Z7901 Long term (current) use of anticoagulants: Secondary | ICD-10-CM

## 2021-07-15 DIAGNOSIS — I4892 Unspecified atrial flutter: Secondary | ICD-10-CM | POA: Diagnosis not present

## 2021-07-15 DIAGNOSIS — Z7982 Long term (current) use of aspirin: Secondary | ICD-10-CM

## 2021-07-15 DIAGNOSIS — I4819 Other persistent atrial fibrillation: Secondary | ICD-10-CM | POA: Diagnosis not present

## 2021-07-15 DIAGNOSIS — R1111 Vomiting without nausea: Secondary | ICD-10-CM

## 2021-07-15 DIAGNOSIS — R739 Hyperglycemia, unspecified: Secondary | ICD-10-CM | POA: Diagnosis not present

## 2021-07-15 DIAGNOSIS — R1310 Dysphagia, unspecified: Secondary | ICD-10-CM

## 2021-07-15 DIAGNOSIS — E872 Acidosis, unspecified: Secondary | ICD-10-CM | POA: Diagnosis present

## 2021-07-15 DIAGNOSIS — Z7952 Long term (current) use of systemic steroids: Secondary | ICD-10-CM

## 2021-07-15 DIAGNOSIS — N401 Enlarged prostate with lower urinary tract symptoms: Secondary | ICD-10-CM | POA: Diagnosis present

## 2021-07-15 DIAGNOSIS — R112 Nausea with vomiting, unspecified: Secondary | ICD-10-CM | POA: Diagnosis not present

## 2021-07-15 DIAGNOSIS — I110 Hypertensive heart disease with heart failure: Secondary | ICD-10-CM | POA: Diagnosis present

## 2021-07-15 DIAGNOSIS — E7800 Pure hypercholesterolemia, unspecified: Secondary | ICD-10-CM | POA: Diagnosis present

## 2021-07-15 DIAGNOSIS — Z8051 Family history of malignant neoplasm of kidney: Secondary | ICD-10-CM

## 2021-07-15 DIAGNOSIS — E785 Hyperlipidemia, unspecified: Secondary | ICD-10-CM | POA: Diagnosis present

## 2021-07-15 DIAGNOSIS — G4733 Obstructive sleep apnea (adult) (pediatric): Secondary | ICD-10-CM | POA: Diagnosis present

## 2021-07-15 DIAGNOSIS — T451X5A Adverse effect of antineoplastic and immunosuppressive drugs, initial encounter: Secondary | ICD-10-CM | POA: Diagnosis present

## 2021-07-15 DIAGNOSIS — E871 Hypo-osmolality and hyponatremia: Secondary | ICD-10-CM | POA: Diagnosis present

## 2021-07-15 DIAGNOSIS — E860 Dehydration: Secondary | ICD-10-CM

## 2021-07-15 DIAGNOSIS — R0902 Hypoxemia: Secondary | ICD-10-CM | POA: Diagnosis not present

## 2021-07-15 DIAGNOSIS — R079 Chest pain, unspecified: Secondary | ICD-10-CM | POA: Diagnosis not present

## 2021-07-15 DIAGNOSIS — E119 Type 2 diabetes mellitus without complications: Secondary | ICD-10-CM

## 2021-07-15 DIAGNOSIS — R0789 Other chest pain: Secondary | ICD-10-CM | POA: Diagnosis not present

## 2021-07-15 DIAGNOSIS — E8729 Other acidosis: Secondary | ICD-10-CM

## 2021-07-15 DIAGNOSIS — F17210 Nicotine dependence, cigarettes, uncomplicated: Secondary | ICD-10-CM | POA: Diagnosis present

## 2021-07-15 DIAGNOSIS — E1142 Type 2 diabetes mellitus with diabetic polyneuropathy: Secondary | ICD-10-CM | POA: Diagnosis present

## 2021-07-15 DIAGNOSIS — I472 Ventricular tachycardia, unspecified: Secondary | ICD-10-CM | POA: Diagnosis not present

## 2021-07-15 DIAGNOSIS — Z923 Personal history of irradiation: Secondary | ICD-10-CM

## 2021-07-15 DIAGNOSIS — Z91041 Radiographic dye allergy status: Secondary | ICD-10-CM

## 2021-07-15 DIAGNOSIS — N179 Acute kidney failure, unspecified: Secondary | ICD-10-CM | POA: Diagnosis present

## 2021-07-15 DIAGNOSIS — J438 Other emphysema: Secondary | ICD-10-CM | POA: Diagnosis present

## 2021-07-15 DIAGNOSIS — Z20822 Contact with and (suspected) exposure to covid-19: Secondary | ICD-10-CM | POA: Diagnosis present

## 2021-07-15 DIAGNOSIS — I10 Essential (primary) hypertension: Secondary | ICD-10-CM | POA: Diagnosis present

## 2021-07-15 DIAGNOSIS — Z79899 Other long term (current) drug therapy: Secondary | ICD-10-CM

## 2021-07-15 DIAGNOSIS — Z880 Allergy status to penicillin: Secondary | ICD-10-CM

## 2021-07-15 DIAGNOSIS — R000 Tachycardia, unspecified: Secondary | ICD-10-CM | POA: Diagnosis not present

## 2021-07-15 DIAGNOSIS — I9589 Other hypotension: Secondary | ICD-10-CM | POA: Diagnosis present

## 2021-07-15 DIAGNOSIS — F32A Depression, unspecified: Secondary | ICD-10-CM | POA: Diagnosis present

## 2021-07-15 DIAGNOSIS — K219 Gastro-esophageal reflux disease without esophagitis: Secondary | ICD-10-CM | POA: Diagnosis present

## 2021-07-15 DIAGNOSIS — Z91013 Allergy to seafood: Secondary | ICD-10-CM

## 2021-07-15 DIAGNOSIS — C155 Malignant neoplasm of lower third of esophagus: Secondary | ICD-10-CM | POA: Diagnosis present

## 2021-07-15 DIAGNOSIS — D72828 Other elevated white blood cell count: Secondary | ICD-10-CM | POA: Diagnosis not present

## 2021-07-15 DIAGNOSIS — G250 Essential tremor: Secondary | ICD-10-CM | POA: Diagnosis present

## 2021-07-15 DIAGNOSIS — E876 Hypokalemia: Secondary | ICD-10-CM | POA: Diagnosis present

## 2021-07-15 DIAGNOSIS — D61810 Antineoplastic chemotherapy induced pancytopenia: Secondary | ICD-10-CM | POA: Diagnosis present

## 2021-07-15 DIAGNOSIS — Z96612 Presence of left artificial shoulder joint: Secondary | ICD-10-CM | POA: Diagnosis present

## 2021-07-15 DIAGNOSIS — I2510 Atherosclerotic heart disease of native coronary artery without angina pectoris: Secondary | ICD-10-CM | POA: Diagnosis present

## 2021-07-15 DIAGNOSIS — G8929 Other chronic pain: Secondary | ICD-10-CM | POA: Diagnosis present

## 2021-07-15 DIAGNOSIS — Z96651 Presence of right artificial knee joint: Secondary | ICD-10-CM | POA: Diagnosis present

## 2021-07-15 HISTORY — DX: Malignant neoplasm of esophagus, unspecified: C15.9

## 2021-07-15 HISTORY — DX: Malignant (primary) neoplasm, unspecified: C80.1

## 2021-07-15 LAB — CBC WITH DIFFERENTIAL/PLATELET
Abs Immature Granulocytes: 0.06 10*3/uL (ref 0.00–0.07)
Basophils Absolute: 0 10*3/uL (ref 0.0–0.1)
Basophils Relative: 1 %
Eosinophils Absolute: 0 10*3/uL (ref 0.0–0.5)
Eosinophils Relative: 0 %
HCT: 41.8 % (ref 39.0–52.0)
Hemoglobin: 14.2 g/dL (ref 13.0–17.0)
Immature Granulocytes: 1 %
Lymphocytes Relative: 3 %
Lymphs Abs: 0.1 10*3/uL — ABNORMAL LOW (ref 0.7–4.0)
MCH: 32.6 pg (ref 26.0–34.0)
MCHC: 34 g/dL (ref 30.0–36.0)
MCV: 95.9 fL (ref 80.0–100.0)
Monocytes Absolute: 0.5 10*3/uL (ref 0.1–1.0)
Monocytes Relative: 11 %
Neutro Abs: 3.7 10*3/uL (ref 1.7–7.7)
Neutrophils Relative %: 84 %
Platelets: 114 10*3/uL — ABNORMAL LOW (ref 150–400)
RBC: 4.36 MIL/uL (ref 4.22–5.81)
RDW: 20.6 % — ABNORMAL HIGH (ref 11.5–15.5)
WBC Morphology: INCREASED
WBC: 4.4 10*3/uL (ref 4.0–10.5)
nRBC: 1.1 % — ABNORMAL HIGH (ref 0.0–0.2)

## 2021-07-15 LAB — COMPREHENSIVE METABOLIC PANEL
ALT: 42 U/L (ref 0–44)
AST: 62 U/L — ABNORMAL HIGH (ref 15–41)
Albumin: 2.9 g/dL — ABNORMAL LOW (ref 3.5–5.0)
Alkaline Phosphatase: 69 U/L (ref 38–126)
Anion gap: 17 — ABNORMAL HIGH (ref 5–15)
BUN: 21 mg/dL (ref 8–23)
CO2: 22 mmol/L (ref 22–32)
Calcium: 8.8 mg/dL — ABNORMAL LOW (ref 8.9–10.3)
Chloride: 90 mmol/L — ABNORMAL LOW (ref 98–111)
Creatinine, Ser: 1.57 mg/dL — ABNORMAL HIGH (ref 0.61–1.24)
GFR, Estimated: 45 mL/min — ABNORMAL LOW (ref >60)
Glucose, Bld: 286 mg/dL — ABNORMAL HIGH (ref 70–99)
Potassium: 3.6 mmol/L (ref 3.5–5.1)
Sodium: 129 mmol/L — ABNORMAL LOW (ref 135–145)
Total Bilirubin: 1.2 mg/dL (ref 0.3–1.2)
Total Protein: 6.3 g/dL — ABNORMAL LOW (ref 6.5–8.1)

## 2021-07-15 LAB — BASIC METABOLIC PANEL
Anion gap: 13 (ref 5–15)
BUN: 23 mg/dL (ref 8–23)
CO2: 24 mmol/L (ref 22–32)
Calcium: 8.2 mg/dL — ABNORMAL LOW (ref 8.9–10.3)
Chloride: 95 mmol/L — ABNORMAL LOW (ref 98–111)
Creatinine, Ser: 1.44 mg/dL — ABNORMAL HIGH (ref 0.61–1.24)
GFR, Estimated: 50 mL/min — ABNORMAL LOW (ref >60)
Glucose, Bld: 214 mg/dL — ABNORMAL HIGH (ref 70–99)
Potassium: 3.6 mmol/L (ref 3.5–5.1)
Sodium: 132 mmol/L — ABNORMAL LOW (ref 135–145)

## 2021-07-15 LAB — LIPASE, BLOOD: Lipase: 16 U/L (ref 11–51)

## 2021-07-15 LAB — RESP PANEL BY RT-PCR (FLU A&B, COVID) ARPGX2
Influenza A by PCR: NEGATIVE
Influenza B by PCR: NEGATIVE
SARS Coronavirus 2 by RT PCR: NEGATIVE

## 2021-07-15 LAB — HEMOGLOBIN A1C
Hgb A1c MFr Bld: 5.9 % — ABNORMAL HIGH (ref 4.8–5.6)
Mean Plasma Glucose: 122.63 mg/dL

## 2021-07-15 MED ORDER — TIZANIDINE HCL 4 MG PO TABS
4.0000 mg | ORAL_TABLET | Freq: Three times a day (TID) | ORAL | Status: DC | PRN
  Administered 2021-07-15 – 2021-07-20 (×7): 4 mg via ORAL
  Filled 2021-07-15 (×8): qty 1

## 2021-07-15 MED ORDER — NICOTINE 21 MG/24HR TD PT24
21.0000 mg | MEDICATED_PATCH | Freq: Every day | TRANSDERMAL | Status: DC
  Administered 2021-07-15 – 2021-07-21 (×7): 21 mg via TRANSDERMAL
  Filled 2021-07-15 (×7): qty 1

## 2021-07-15 MED ORDER — ALBUTEROL SULFATE (2.5 MG/3ML) 0.083% IN NEBU: 3.0000 mL | INHALATION_SOLUTION | Freq: Four times a day (QID) | RESPIRATORY_TRACT | Status: DC | PRN

## 2021-07-15 MED ORDER — GABAPENTIN 300 MG PO CAPS
300.0000 mg | ORAL_CAPSULE | Freq: Three times a day (TID) | ORAL | Status: DC
  Administered 2021-07-15 – 2021-07-21 (×17): 300 mg via ORAL
  Filled 2021-07-15 (×19): qty 1

## 2021-07-15 MED ORDER — SODIUM CHLORIDE 0.9 % IV SOLN: INTRAVENOUS | Status: DC

## 2021-07-15 MED ORDER — PROCHLORPERAZINE EDISYLATE 10 MG/2ML IJ SOLN
10.0000 mg | Freq: Four times a day (QID) | INTRAMUSCULAR | Status: DC | PRN
  Administered 2021-07-16 – 2021-07-17 (×4): 10 mg via INTRAVENOUS
  Filled 2021-07-15 (×4): qty 2

## 2021-07-15 MED ORDER — MORPHINE SULFATE (PF) 2 MG/ML IV SOLN
1.0000 mg | INTRAVENOUS | Status: DC | PRN
  Administered 2021-07-17 – 2021-07-18 (×2): 1 mg via INTRAVENOUS
  Filled 2021-07-15 (×2): qty 1

## 2021-07-15 MED ORDER — INSULIN ASPART 100 UNIT/ML IJ SOLN
0.0000 [IU] | Freq: Three times a day (TID) | INTRAMUSCULAR | Status: DC
  Administered 2021-07-17 – 2021-07-21 (×5): 1 [IU] via SUBCUTANEOUS

## 2021-07-15 MED ORDER — TAMSULOSIN HCL 0.4 MG PO CAPS
0.4000 mg | ORAL_CAPSULE | Freq: Every day | ORAL | Status: DC
  Administered 2021-07-16 – 2021-07-21 (×6): 0.4 mg via ORAL
  Filled 2021-07-15 (×6): qty 1

## 2021-07-15 MED ORDER — ALPRAZOLAM 0.5 MG PO TABS
0.5000 mg | ORAL_TABLET | Freq: Two times a day (BID) | ORAL | Status: DC | PRN
  Administered 2021-07-15 – 2021-07-16 (×2): 0.5 mg via ORAL
  Filled 2021-07-15 (×2): qty 1

## 2021-07-15 MED ORDER — ONDANSETRON HCL 4 MG PO TABS: 4.0000 mg | ORAL_TABLET | Freq: Four times a day (QID) | ORAL | Status: DC | PRN

## 2021-07-15 MED ORDER — OXYCODONE-ACETAMINOPHEN 10-325 MG PO TABS: 1.0000 | ORAL_TABLET | Freq: Four times a day (QID) | ORAL | Status: DC | PRN

## 2021-07-15 MED ORDER — OXYCODONE HCL 5 MG PO TABS
5.0000 mg | ORAL_TABLET | Freq: Four times a day (QID) | ORAL | Status: DC | PRN
  Administered 2021-07-15 – 2021-07-21 (×7): 5 mg via ORAL
  Filled 2021-07-15 (×8): qty 1

## 2021-07-15 MED ORDER — OXYCODONE-ACETAMINOPHEN 5-325 MG PO TABS
1.0000 | ORAL_TABLET | Freq: Four times a day (QID) | ORAL | Status: DC | PRN
  Administered 2021-07-16 – 2021-07-21 (×10): 1 via ORAL
  Filled 2021-07-15 (×11): qty 1

## 2021-07-15 MED ORDER — PANTOPRAZOLE SODIUM 40 MG IV SOLR
40.0000 mg | INTRAVENOUS | Status: DC
  Administered 2021-07-15: 40 mg via INTRAVENOUS
  Filled 2021-07-15: qty 40

## 2021-07-15 MED ORDER — SODIUM CHLORIDE 0.9 % IV BOLUS
1000.0000 mL | Freq: Once | INTRAVENOUS | Status: AC
  Administered 2021-07-15: 1000 mL via INTRAVENOUS

## 2021-07-15 MED ORDER — PRIMIDONE 50 MG PO TABS
50.0000 mg | ORAL_TABLET | Freq: Two times a day (BID) | ORAL | Status: DC
  Administered 2021-07-15 – 2021-07-21 (×12): 50 mg via ORAL
  Filled 2021-07-15 (×14): qty 1

## 2021-07-15 MED ORDER — ESCITALOPRAM OXALATE 10 MG PO TABS
10.0000 mg | ORAL_TABLET | Freq: Every day | ORAL | Status: DC
  Administered 2021-07-16 – 2021-07-21 (×6): 10 mg via ORAL
  Filled 2021-07-15 (×6): qty 1

## 2021-07-15 MED ORDER — ENOXAPARIN SODIUM 40 MG/0.4ML IJ SOSY
40.0000 mg | PREFILLED_SYRINGE | INTRAMUSCULAR | Status: DC
  Administered 2021-07-15 – 2021-07-18 (×4): 40 mg via SUBCUTANEOUS
  Filled 2021-07-15 (×4): qty 0.4

## 2021-07-15 MED ORDER — ROPINIROLE HCL 0.5 MG PO TABS
0.5000 mg | ORAL_TABLET | Freq: Every day | ORAL | Status: DC
  Administered 2021-07-15 – 2021-07-20 (×6): 0.5 mg via ORAL
  Filled 2021-07-15 (×6): qty 1

## 2021-07-15 MED ORDER — ACETAMINOPHEN 325 MG PO TABS: 650.0000 mg | ORAL_TABLET | Freq: Three times a day (TID) | ORAL | Status: DC | PRN

## 2021-07-15 MED ORDER — ONDANSETRON HCL 4 MG/2ML IJ SOLN
4.0000 mg | Freq: Four times a day (QID) | INTRAMUSCULAR | Status: DC | PRN
  Administered 2021-07-15 – 2021-07-18 (×2): 4 mg via INTRAVENOUS
  Filled 2021-07-15 (×2): qty 2

## 2021-07-15 NOTE — H&P (Signed)
History and Physical    Justin Trujillo. XTG:[id number] DOB: 1946/07/04 DOA: 07/15/2021  PCP: Carol Ada, MD Consultants:  oncology: Dr. Lorenso Courier, urology: Dr. Lovena Neighbours and neurosurgery: Dr. Ellene Route  Patient coming from:  Home - lives with wife and daughter and her family   Chief Complaint: intractable nausea/vomiting.   HPI: Justin Trujillo. is a 75 y.o. male with medical history significant of esophageal cancer currently on chemotherapy, hypertension, hyperlipidemia, GERD, type 2 diabetes, OSA, and CAD presenting to ED with intractable nausea and vomiting. He states  Thursday he was NPO for a CT. When he got home he started to feel bad and started to throw up. He was unable to keep anything down. His wife states it kept getting worse and this AM he had projectile vomiting that was green. His vomiting was ongoing and too numerous to count. No stomach pain. One episode diarrhea. He has had dizziness and lightheadedness, but has situational vertigo; however, this feels different. He also is extremely weak.   He denies any fever, chills. No sick contacts. He has no chest pain or palpitations, no shortness of breath. Has a chronic cough. Has had urinary urgency and has not made it to the toilet at times.   Had a CT abdomen/pelvis yesterday, results pending  Blood pressure has run low since chemo: average of 90/60.   Smokes 1PPD.   ED Course: vitals: Afebrile, blood pressure 95/67, heart rate 120, respiratory rate 20, oxygen 94% on room air. Pertinent labs: Platelets 114, sodium 129, chloride 90, glucose 286, creatinine 1.57, AST 62, UA pending, COVID and flu negative. In ED patient given 2 L bolus and TRH was asked to admit.  Review of Systems: As per HPI; otherwise review of systems reviewed and negative.   Ambulatory Status:  Ambulates without assistance. Will use a walker as needed and at times a cane.    Past Medical History:  Diagnosis Date   Arthritis    Cancer ([place])     Coronary atherosclerosis of native coronary artery    Depression    Esophageal cancer (HCC)    Essential tremor    High cholesterol    Hypertension    Hypertension    Hypogonadism male    Obstructive chronic bronchitis with exacerbation (HCC)    Obstructive sleep apnea (adult) (pediatric)    no cpap   Other and unspecified hyperlipidemia    Other emphysema ([place])    Proteinuria 2019   has seen a nephrologist   Shortness of breath    with excertion   Situational stress    Type II or unspecified type diabetes mellitus without mention of complication, not stated as uncontrolled    type 2   Unspecified essential hypertension     Past Surgical History:  Procedure Laterality Date   CARDIAC CATHETERIZATION     IR IMAGING GUIDED PORT INSERTION  04/24/2021   IR KYPHO LUMBAR INC FX REDUCE BONE BX UNI/BIL CANNULATION INC/IMAGING  06/13/2018   L lens implant     R knee replacement x2     REVERSE SHOULDER ARTHROPLASTY Left 03/26/2019   Procedure: REVERSE SHOULDER ARTHROPLASTY;  Surgeon: Justice Britain, MD;  Location: [facility];  Service: Orthopedics;  Laterality: Left;  185min   TONSILLECTOMY     TONSILLECTOMY AND ADENOIDECTOMY     vascectomy      Social History   Socioeconomic History   Marital status: Married    Spouse name: Not on file   Number of children:  Not on file   Years of education: Not on file   Highest education level: Not on file  Occupational History   Occupation: Landscape architect  Tobacco Use   Smoking status: Every Day    Packs/day: 1.00    Years: 55.00    Pack years: 55.00    Types: Cigarettes   Smokeless tobacco: Never  Vaping Use   Vaping Use: Never used  Substance and Sexual Activity   Alcohol use: Yes    Alcohol/week: 1.0 standard drink    Types: 1 Cans of beer per week    Comment: occasional beer   Drug use: Never   Sexual activity: Not Currently  Other Topics Concern   Not on file  Social History Narrative   ** Merged History Encounter  **       Social Determinants of Health   Financial Resource Strain: Not on file  Food Insecurity: Not on file  Transportation Needs: Not on file  Physical Activity: Not on file  Stress: Not on file  Social Connections: Not on file  Intimate Partner Violence: Not on file    Allergies  Allergen Reactions   Iodinated Diagnostic Agents Anaphylaxis   Moxifloxacin Swelling    REACTION: GI upset, throat "tightened up"   Penicillins Anaphylaxis    Did it involve swelling of the face/tongue/throat, SOB, or low BP? Yes Did it involve sudden or severe rash/hives, skin peeling, or any reaction on the inside of your mouth or nose? No Did you need to seek medical attention at a hospital or doctor's office? Yes When did it last happen?   been a while     If all above answers are "NO", may proceed with cephalosporin use.    Shellfish-Derived Products Anaphylaxis   Amoxicillin Other (See Comments)   Other Other (See Comments)   Latex Rash    Over long periods of time    Family History  Problem Relation Age of Onset   Emphysema Father    Heart disease Father    Clotting disorder Mother    Kidney cancer Child     Prior to Admission medications   Medication Sig Start Date End Date Taking? Authorizing Provider  acetaminophen (TYLENOL) 500 MG tablet Take 650 mg by mouth every 8 (eight) hours as needed (pain).    [provider]  albuterol (VENTOLIN HFA) 108 (90 Truj[place]) MCG/ACT inhaler SMARTSIG:1 Puff(s) Via Inhaler Every 4 Hours PRN 04/24/21   [provider]  ALPRAZolam Duanne Moron) 0.5 MG tablet Take 0.5 mg by mouth 2 (two) times daily.    [provider]  atorvastatin (LIPITOR) 40 MG tablet Take 1 tablet (40 mg total) by mouth daily. 07/23/12   Weber, Damaris Hippo, PA-C  Calcium Carb-Cholecalciferol (CALCIUM 600+D3 PO) Take 1 tablet by mouth daily.    [provider]  Cholecalciferol (VITAMIN D) 50 MCG (2000 UT) tablet Take 2,000 Units by mouth daily.    [provider]  escitalopram (LEXAPRO) 10 MG tablet Take 10 mg by mouth daily.    [provider]  gabapentin (NEURONTIN) 300 MG capsule Take 2 capsules (600 mg total) by mouth 3 (three) times daily. Patient taking differently: Take 300 mg by mouth 3 (three) times daily. 10/31/18   Kristeen Miss, MD  lidocaine-prilocaine (EMLA) cream Apply 1 application topically as needed. Patient not taking: Reported on 07/05/2021 04/24/21   Orson Slick, MD  lisinopril (ZESTRIL) 5 MG tablet Take 5 mg by mouth daily. Patient not  taking: No sig reported 06/13/21   [provider]  Multiple Vitamins-Minerals (CENTRUM SILVER) tablet daily in the afternoon.    [provider]  MYRBETRIQ 50 MG TB24 tablet Take 50 mg by mouth daily. 12/26/20   [provider]  ondansetron (ZOFRAN) 8 MG tablet Take 1 tablet (8 mg total) by mouth every 8 (eight) hours as needed for nausea or vomiting. 04/24/21   Orson Slick, MD  oxyCODONE-acetaminophen (PERCOCET) 10-325 MG tablet Take 1 tablet by mouth every 6 (six) hours as needed. 06/01/21   [provider]  predniSONE (DELTASONE) 50 MG tablet Take 1 tablet 13 hours, 7 hours and 1 hour prior to CT Scan 07/12/21   Orson Slick, MD  primidone (MYSOLINE) 50 MG tablet Take 1 tablet (50 mg total) by mouth in the morning and at bedtime. 06/21/21   Tat, Eustace Quail, DO  prochlorperazine (COMPAZINE) 10 MG tablet Take 1 tablet (10 mg total) by mouth every 6 (six) hours as needed for nausea or vomiting. 04/24/21   Orson Slick, MD  rOPINIRole (REQUIP) 0.5 MG tablet Take 0.5 mg by mouth at bedtime.    [provider]  Semaglutide,0.25 or 0.5MG /DOS, (OZEMPIC, 0.25 OR 0.5 MG/DOSE,) 2 MG/1.5ML SOPN Ozempic 0.25 mg or 0.5 mg (2 mg/1.5 mL) subcutaneous pen injector  INJECT 0.5 MG SUBCUTANEOUSLY ONCE WEEKLY    [provider]  sulfamethoxazole-trimethoprim (BACTRIM DS) 800-160 MG tablet Take 1 tablet by mouth 2 (two) times daily.  06/06/21   [provider]  tamsulosin (FLOMAX) 0.4 MG CAPS capsule Take 0.4 mg by mouth daily. 01/03/21   [provider]  TESTOSTERONE IM Inject 1 mL into the muscle every 14 (fourteen) days.    [provider]  tiZANidine (ZANAFLEX) 4 MG tablet SMARTSIG:1 Tablet(s) By Mouth 1 to 3 Times Daily PRN 01/23/21   [provider]  trolamine salicylate (ASPERCREME) 10 % cream Apply 1 application topically 2 (two) times daily as needed for muscle pain.     [provider]    Physical Exam: Vitals:   07/15/21 1430 07/15/21 1630 07/15/21 1700 07/15/21 1730  BP: 90/62 99/68 93/66  93/61  Pulse: (!) 104 (!) 109 (!) 110 (!) 110  Resp: 20 (!) 23 (!) 21 19  Temp:      TempSrc:      SpO2: 91% 93% 93% 91%  Weight:      Height:         General:  Appears calm and comfortable and is in NAD. Doesn't look like he feels well.  Eyes:  PERRL, EOMI, normal lids, iris ENT:  grossly normal hearing, lips & tongue, dry mucous membranes; appropriate dentition Neck:  no LAD, masses or thyromegaly; no carotid bruits Cardiovascular:  sinus tachycardia, no m/r/g. No LE edema.  Respiratory:   CTA bilaterally with no wheezes/rales/rhonchi.  Normal respiratory effort. Abdomen:  soft, TTP in epigastric area, ND, NABS Back:   normal alignment, no CVAT Skin:  no rash or induration seen on limited exam Musculoskeletal:  grossly normal tone BUE/BLE, good ROM, no bony abnormality Lower extremity:  No LE edema.  Limited foot exam with no ulcerations.  2+ distal pulses. Psychiatric:  grossly normal mood and affect, speech fluent and appropriate, AOx3 Neurologic:  CN 2-12 grossly intact, moves all extremities in coordinated fashion, sensation intact    Radiological Exams on Admission: Independently reviewed - see discussion in A/P where applicable  No results found.  EKG: Independently reviewed.  Sinus tachy with rate 135; nonspecific ST changes with no evidence of acute  ischemia IRBBB seen on previous ekg. Some PVC   Repeat pending   Labs on Admission: I have personally reviewed the available labs and imaging studies at the time of the admission.  Pertinent labs:  Platelets 114,  sodium 129,  chloride 90,  glucose 286,  creatinine 1.57,  AST 62,  UA pending,  COVID and flu negative.    Assessment/Plan Active Problems:  Intractable nausea and vomiting -75 year old male presenting with 2-day history of intractable vomiting following oral ingestion of contrast for CT Abdo pelvis. Has been unable to tolerate anything by mouth. Also received prednisone 50mg  TID over the span of 13 hours prior to CT. Has not vomited since being in ED.  -last chemo tx was 07/05/21, no sick contacts at home.  -IVF resuscitation, anti-emetics with zofran and compazine. Feeling better after 2L boluses  -have asked radiology to read his CT abdo/pelvis from 10/27 to make sure no obstruction or other acute finding-stable findings of esophageal CA, no bowel obstruction or other acute findings.  -IV protonix q 24 hours  -continue IVF -check lipase  -clinically improved when seeing him with no episodes of emesis since being in ED, if clinical picture changes may warrant additional imaging.      Dehydration -secondary to intractable vomiting with tachycardia, hypotension and AKI -IVF resuscitation and anti-emetics -blood pressure around 90-100/60 baseline  -tachycardia much improved with IVF and around 105 bpm.  -check orthostatics     AKI (acute kidney injury) ([place]) -pre renal in setting of intractable vomiting.  -IVF and trend bmp -hold nephrotoxic drugs     Metabolic acidosis, increased anion gap -secondary to AKI -IVF resuscitation -follow bmp- repeat resolved.   Hyponatremia Likely secondary to hypovolemia vs. Malignancy  Rage from 131-138 IVF Repeat: 132    Malignant neoplasm of lower third of esophagus ([place]) -followed outpatient by dr. Lorenso Courier,  currently receiving chemo  -He has metastatic adenocarcinoma of the distal esophagus -chemo therapy with folfox + nivolumab q 2 weeks. Has completed 6 cycles. Last treatment on 10/19.  -oncology consulted     DM type 2 (diabetes mellitus, type 2) ([place]) -a1c pending, no records since 2020 -accuchecks and SSI -hold GLP-1   Chronic back pain -continue home meds: oxycodone, gabapentin, tizanidine if he can keep down.  -ordered prn morphine if can not tolerate PO as do not want any withdrawal symptoms contributing     CAD (coronary artery disease) Holding statin until vomiting resolves     HTN (hypertension) Has been hypotensive and HTN drugs discontinued in outpatient setting     Dyslipidemia Hold statin with intractable n/v  Anxiety Continue lexapro/xanax prn     OBSTRUCTIVE SLEEP APNEA  No cpap at night    Body mass index is 27.44 kg/m.    Level of care: Telemetry Medical DVT prophylaxis:  Lovenox  Code Status:  DNR- confirmed with patient Family Communication: wife at bedside: Aris Lot  Disposition Plan:  The patient is from: home  Anticipated d/c is to: home   Requires inpatient hospitalization and is at significant risk of worsening, requires IVF, medication, constant monitoring, assessment and MDM with specialists.    Patient is currently: acutely ill Consults called: oncology   Admission status:  inpatient   Dragon dictation used in completing this note.    Orma Flaming MD [facility]   How to contact the [facility] Attending or Consulting provider [place]  or covering provider during after hours [place], for this patient?  Check the care team in [facility] and look for a) attending/consulting [facility] provider listed and b) the [facility] team listed Log into [email or web address] and use 's universal password to access. If you do not have the password, please contact the hospital operator. Locate the [facility] provider you are looking for under [facility] and page  to a number that you can be directly reached. If you still have difficulty reaching the provider, please page the [facility] (Director on Call) for the Hospitalists listed on amion for assistance.   07/15/2021, 7:07 PM

## 2021-07-15 NOTE — ED Triage Notes (Signed)
Patient presents to ED via [facility] states he started with n/v thurs. Denies chest pain , Patient has esophageal CA and currently getting chem. Last dose 2 weeks ago. Wife at bedside.

## 2021-07-15 NOTE — ED Provider Notes (Signed)
[place] EMERGENCY DEPARTMENT Provider Note   CSN: [id number] Arrival date & time: 07/15/21  1023     History Chief Complaint  Patient presents with  . Emesis    Justin Trujillo. is a 75 y.o. male.  Pt is is a current chemo pt.  Pt has esophageal cancer.  Pt unable to drink.  Pt vomiting for 4 days     The history is provided by the patient. No language interpreter was used.  Emesis Severity:  Severe Duration:  4 days Number of daily episodes:  Multiple Progression:  Worsening Recent urination:  Normal Relieved by:  Nothing Associated symptoms: no abdominal pain, no chills, no cough, no diarrhea and no fever   Risk factors: no alcohol use       Past Medical History:  Diagnosis Date  . Arthritis   . Cancer ([place])   . Coronary atherosclerosis of native coronary artery   . Depression   . Esophageal cancer ([place])   . Essential tremor   . High cholesterol   . Hypertension   . Hypertension   . Hypogonadism male   . Obstructive chronic bronchitis with exacerbation ([place])   . Obstructive sleep apnea (adult) (pediatric)    no cpap  . Other and unspecified hyperlipidemia   . Other emphysema ([place])   . Proteinuria 2019   has seen a nephrologist  . Shortness of breath    with excertion  . Situational stress   . Type II or unspecified type diabetes mellitus without mention of complication, not stated as uncontrolled    type 2  . Unspecified essential hypertension     Patient Active Problem List   Diagnosis Date Noted  . Port-A-Cath in place 06/07/2021  . 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 Malignant neoplasm of lower third of esophagus ([place]) 03/07/2021  . 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 Lumbar stenosis with neurogenic claudication 10/28/2018  . DM type 2 (diabetes mellitus, type 2) ([place]) 01/15/2012  . CAD (coronary artery disease) 01/15/2012  . HTN (hypertension) 01/15/2012  . Dyslipidemia  01/15/2012  . COPD (chronic obstructive pulmonary disease); chronic bronchitis 01/15/2012  . Tobacco abuse 01/15/2012  . BMI 40.0-44.9, adult ([place]) 01/15/2012  . OSA (obstructive sleep apnea) 01/15/2012  . Insomnia 01/15/2012  . IBS (irritable bowel syndrome) 01/15/2012  . Hypogonadism male 01/15/2012  . DJD (degenerative joint disease) 01/15/2012  . Rotator cuff arthropathy, left 01/15/2012  . GERD (gastroesophageal reflux disease) 01/15/2012  . CHEST PAIN, ATYPICAL 07/12/2010  . TOBACCO USER 10/20/2009  . CAD, NATIVE VESSEL 09/29/2009  . EMPHYSEMA 09/21/2009  . DM 08/31/2009  . HYPERLIPIDEMIA 08/31/2009  . OBSTRUCTIVE SLEEP APNEA 08/31/2009  . Essential hypertension 08/31/2009  . CHRONIC OBSTRUCTIVE PULMONARY DISEASE, ACUTE EXACERBATION 08/31/2009  . DYSPNEA 08/31/2009    Past Surgical History:  Procedure Laterality Date  . CARDIAC CATHETERIZATION    . IR IMAGING GUIDED PORT INSERTION  04/24/2021  . IR KYPHO LUMBAR INC FX REDUCE BONE BX UNI/BIL CANNULATION INC/IMAGING  06/13/2018  . L lens implant    . R knee replacement x2    . REVERSE SHOULDER ARTHROPLASTY Left 03/26/2019   Procedure: REVERSE SHOULDER ARTHROPLASTY;  Surgeon: Justice Britain, MD;  Location: [facility];  Service: Orthopedics;  Laterality: Left;  168min  . TONSILLECTOMY    . TONSILLECTOMY AND ADENOIDECTOMY    . vascectomy         Family History  Problem Relation Age of Onset  .  Emphysema Father   . Heart disease Father   . Clotting disorder Mother   . Kidney cancer Child     Social History   Tobacco Use  . Smoking status: Every Day    Packs/day: 1.00    Years: 55.00    Pack years: 55.00    Types: Cigarettes  . Smokeless tobacco: Never  Vaping Use  . Vaping Use: Never used  Substance Use Topics  . Alcohol use: Yes    Alcohol/week: 1.0 standard drink    Types: 1 Cans of beer per week    Comment: occasional beer  . Drug use: Never    Home Medications Prior to Admission medications   Medication  Sig Start Date End Date Taking? Authorizing Provider  acetaminophen (TYLENOL) 500 MG tablet Take 650 mg by mouth every 8 (eight) hours as needed (pain).    [provider]  albuterol (VENTOLIN HFA) 108 (90 Base) MCG/ACT inhaler SMARTSIG:1 Puff(s) Via Inhaler Every 4 Hours PRN 04/24/21   [provider]  ALPRAZolam Duanne Moron) 0.5 MG tablet Take 0.5 mg by mouth 2 (two) times daily.    [provider]  atorvastatin (LIPITOR) 40 MG tablet Take 1 tablet (40 mg total) by mouth daily. 07/23/12   Weber, Damaris Hippo, PA-C  Calcium Carb-Cholecalciferol (CALCIUM 600+D3 PO) Take 1 tablet by mouth daily.    [provider]  Cholecalciferol (VITAMIN D) 50 MCG (2000 UT) tablet Take 2,000 Units by mouth daily.    [provider]  escitalopram (LEXAPRO) 10 MG tablet Take 10 mg by mouth daily.    [provider]  gabapentin (NEURONTIN) 300 MG capsule Take 2 capsules (600 mg total) by mouth 3 (three) times daily. Patient taking differently: Take 300 mg by mouth 3 (three) times daily. 10/31/18   Kristeen Miss, MD  lidocaine-prilocaine (EMLA) cream Apply 1 application topically as needed. Patient not taking: Reported on 07/05/2021 04/24/21   Orson Slick, MD  lisinopril (ZESTRIL) 5 MG tablet Take 5 mg by mouth daily. Patient not taking: No sig reported 06/13/21   [provider]  Multiple Vitamins-Minerals (CENTRUM SILVER) tablet daily in the afternoon.    [provider]  MYRBETRIQ 50 MG TB24 tablet Take 50 mg by mouth daily. 12/26/20   [provider]  ondansetron (ZOFRAN) 8 MG tablet Take 1 tablet (8 mg total) by mouth every 8 (eight) hours as needed for nausea or vomiting. 04/24/21   Orson Slick, MD  oxyCODONE-acetaminophen (PERCOCET) 10-325 MG tablet Take 1 tablet by mouth every 6 (six) hours as needed. 06/01/21   [provider]  predniSONE (DELTASONE) 50 MG tablet Take 1 tablet 13 hours, 7 hours and 1 hour prior to CT Scan  07/12/21   Orson Slick, MD  primidone (MYSOLINE) 50 MG tablet Take 1 tablet (50 mg total) by mouth in the morning and at bedtime. 06/21/21   Tat, Eustace Quail, DO  prochlorperazine (COMPAZINE) 10 MG tablet Take 1 tablet (10 mg total) by mouth every 6 (six) hours as needed for nausea or vomiting. 04/24/21   Orson Slick, MD  rOPINIRole (REQUIP) 0.5 MG tablet Take 0.5 mg by mouth at bedtime.    [provider]  Semaglutide,0.25 or 0.5MG /DOS, (OZEMPIC, 0.25 OR 0.5 MG/DOSE,) 2 MG/1.5ML SOPN Ozempic 0.25 mg or 0.5 mg (2 mg/1.5 mL) subcutaneous pen injector  INJECT 0.5 MG SUBCUTANEOUSLY ONCE WEEKLY    [provider]  sulfamethoxazole-trimethoprim (BACTRIM DS) 800-160 MG tablet  Take 1 tablet by mouth 2 (two) times daily. 06/06/21   [provider]  tamsulosin (FLOMAX) 0.4 MG CAPS capsule Take 0.4 mg by mouth daily. 01/03/21   [provider]  TESTOSTERONE IM Inject 1 mL into the muscle every 14 (fourteen) days.    [provider]  tiZANidine (ZANAFLEX) 4 MG tablet SMARTSIG:1 Tablet(s) By Mouth 1 to 3 Times Daily PRN 01/23/21   [provider]  trolamine salicylate (ASPERCREME) 10 % cream Apply 1 application topically 2 (two) times daily as needed for muscle pain.     [provider]    Allergies    Iodinated diagnostic agents, Moxifloxacin, Penicillins, Shellfish-derived products, Amoxicillin, Other, and Latex  Review of Systems   Review of Systems  Constitutional:  Negative for chills and fever.  Respiratory:  Negative for cough.   Gastrointestinal:  Positive for vomiting. Negative for abdominal pain and diarrhea.  All other systems reviewed and are negative.  Physical Exam Updated Vital Signs BP 97/61   Pulse (!) 109   Temp 98.6 F (37 C) (Oral)   Resp (!) 21   Ht 5\' 6"  (1.676 m)   Wt 77.1 kg   SpO2 93%   BMI 27.44 kg/m   Physical Exam Vitals and nursing note reviewed.  Constitutional:      Appearance: He is  well-developed.  HENT:     Head: Normocephalic and atraumatic.     Mouth/Throat:     Mouth: Mucous membranes are dry.  Eyes:     Conjunctiva/sclera: Conjunctivae normal.  Cardiovascular:     Rate and Rhythm: Tachycardia present.     Heart sounds: No murmur heard. Pulmonary:     Effort: Pulmonary effort is normal. No respiratory distress.     Breath sounds: Normal breath sounds.  Abdominal:     Palpations: Abdomen is soft.     Tenderness: There is no abdominal tenderness.  Musculoskeletal:     Cervical back: Neck supple.  Skin:    General: Skin is warm and dry.  Neurological:     General: No focal deficit present.     Mental Status: He is alert.    ED Results / Procedures / Treatments   Labs (all labs ordered are listed, but only abnormal results are displayed) Labs Reviewed  CBC WITH DIFFERENTIAL/PLATELET - Abnormal; Notable for the following components:      Result Value   RDW 20.6 (*)    Platelets 114 (*)    nRBC 1.1 (*)    Lymphs Abs 0.1 (*)    All other components within normal limits  COMPREHENSIVE METABOLIC PANEL - Abnormal; Notable for the following components:   Sodium 129 (*)    Chloride 90 (*)    Glucose, Bld 286 (*)    Creatinine, Ser 1.57 (*)    Calcium 8.8 (*)    Total Protein 6.3 (*)    Albumin 2.9 (*)    AST 62 (*)    GFR, Estimated 45 (*)    Anion gap 17 (*)    All other components within normal limits  RESP PANEL BY RT-PCR (FLU A&B, COVID) ARPGX2  URINALYSIS, ROUTINE W REFLEX MICROSCOPIC  URINALYSIS, ROUTINE W REFLEX MICROSCOPIC    EKG None  Radiology No results found.  Procedures Procedures   Medications Ordered in ED Medications  sodium chloride 0.9 % bolus 1,000 mL (0 mLs Intravenous Stopped 07/15/21 1236)  sodium chloride 0.9 % bolus 1,000 mL (1,000 mLs Intravenous New Bag/Given 07/15/21 1236)  ED Course  I have reviewed the triage vital signs and the nursing notes.  Pertinent labs & imaging results that were available  during my care of the patient were reviewed by me and considered in my medical decision making (see chart for details).    MDM Rules/Calculators/A&P                           MDM:  Pt appears dehydrated, tachycardic.  IV fluids x 2 liters ordered Labs  show na of 129.  Covid negative,  Hospitalist consulted for admission Dr. Rogers Blocker will see pt here for admission  Final Clinical Impression(s) / ED Diagnoses Final diagnoses:  None    Rx / DC Orders ED Discharge Orders     None        Sidney Ace 07/15/21 [place], [place], MD 07/15/21 1819

## 2021-07-15 NOTE — ED Notes (Signed)
Pt given ice chips

## 2021-07-16 DIAGNOSIS — E871 Hypo-osmolality and hyponatremia: Secondary | ICD-10-CM | POA: Diagnosis not present

## 2021-07-16 DIAGNOSIS — R1111 Vomiting without nausea: Secondary | ICD-10-CM

## 2021-07-16 DIAGNOSIS — F17210 Nicotine dependence, cigarettes, uncomplicated: Secondary | ICD-10-CM | POA: Diagnosis present

## 2021-07-16 DIAGNOSIS — I2510 Atherosclerotic heart disease of native coronary artery without angina pectoris: Secondary | ICD-10-CM | POA: Diagnosis present

## 2021-07-16 DIAGNOSIS — Z66 Do not resuscitate: Secondary | ICD-10-CM | POA: Diagnosis not present

## 2021-07-16 DIAGNOSIS — K219 Gastro-esophageal reflux disease without esophagitis: Secondary | ICD-10-CM | POA: Diagnosis present

## 2021-07-16 DIAGNOSIS — I480 Paroxysmal atrial fibrillation: Secondary | ICD-10-CM | POA: Diagnosis not present

## 2021-07-16 DIAGNOSIS — C155 Malignant neoplasm of lower third of esophagus: Secondary | ICD-10-CM | POA: Diagnosis not present

## 2021-07-16 DIAGNOSIS — T451X5A Adverse effect of antineoplastic and immunosuppressive drugs, initial encounter: Secondary | ICD-10-CM | POA: Diagnosis present

## 2021-07-16 DIAGNOSIS — D72828 Other elevated white blood cell count: Secondary | ICD-10-CM | POA: Diagnosis not present

## 2021-07-16 DIAGNOSIS — J438 Other emphysema: Secondary | ICD-10-CM | POA: Diagnosis present

## 2021-07-16 DIAGNOSIS — I501 Left ventricular failure: Secondary | ICD-10-CM | POA: Diagnosis not present

## 2021-07-16 DIAGNOSIS — I10 Essential (primary) hypertension: Secondary | ICD-10-CM | POA: Diagnosis not present

## 2021-07-16 DIAGNOSIS — E860 Dehydration: Secondary | ICD-10-CM

## 2021-07-16 DIAGNOSIS — E1169 Type 2 diabetes mellitus with other specified complication: Secondary | ICD-10-CM | POA: Diagnosis not present

## 2021-07-16 DIAGNOSIS — E872 Acidosis, unspecified: Secondary | ICD-10-CM | POA: Diagnosis not present

## 2021-07-16 DIAGNOSIS — Z96651 Presence of right artificial knee joint: Secondary | ICD-10-CM | POA: Diagnosis present

## 2021-07-16 DIAGNOSIS — N179 Acute kidney failure, unspecified: Secondary | ICD-10-CM

## 2021-07-16 DIAGNOSIS — I110 Hypertensive heart disease with heart failure: Secondary | ICD-10-CM | POA: Diagnosis present

## 2021-07-16 DIAGNOSIS — E7800 Pure hypercholesterolemia, unspecified: Secondary | ICD-10-CM | POA: Diagnosis present

## 2021-07-16 DIAGNOSIS — I4891 Unspecified atrial fibrillation: Secondary | ICD-10-CM | POA: Diagnosis not present

## 2021-07-16 DIAGNOSIS — Z20822 Contact with and (suspected) exposure to covid-19: Secondary | ICD-10-CM | POA: Diagnosis not present

## 2021-07-16 DIAGNOSIS — F32A Depression, unspecified: Secondary | ICD-10-CM | POA: Diagnosis present

## 2021-07-16 DIAGNOSIS — I5031 Acute diastolic (congestive) heart failure: Secondary | ICD-10-CM | POA: Diagnosis not present

## 2021-07-16 DIAGNOSIS — R112 Nausea with vomiting, unspecified: Secondary | ICD-10-CM | POA: Diagnosis present

## 2021-07-16 DIAGNOSIS — I4819 Other persistent atrial fibrillation: Secondary | ICD-10-CM | POA: Diagnosis not present

## 2021-07-16 DIAGNOSIS — E785 Hyperlipidemia, unspecified: Secondary | ICD-10-CM | POA: Diagnosis not present

## 2021-07-16 DIAGNOSIS — I472 Ventricular tachycardia, unspecified: Secondary | ICD-10-CM | POA: Diagnosis not present

## 2021-07-16 DIAGNOSIS — I4892 Unspecified atrial flutter: Secondary | ICD-10-CM | POA: Diagnosis not present

## 2021-07-16 DIAGNOSIS — D61810 Antineoplastic chemotherapy induced pancytopenia: Secondary | ICD-10-CM | POA: Diagnosis not present

## 2021-07-16 DIAGNOSIS — D696 Thrombocytopenia, unspecified: Secondary | ICD-10-CM | POA: Diagnosis not present

## 2021-07-16 DIAGNOSIS — G250 Essential tremor: Secondary | ICD-10-CM | POA: Diagnosis present

## 2021-07-16 DIAGNOSIS — E1142 Type 2 diabetes mellitus with diabetic polyneuropathy: Secondary | ICD-10-CM | POA: Diagnosis present

## 2021-07-16 LAB — COMPREHENSIVE METABOLIC PANEL
ALT: 37 U/L (ref 0–44)
AST: 44 U/L — ABNORMAL HIGH (ref 15–41)
Albumin: 2.3 g/dL — ABNORMAL LOW (ref 3.5–5.0)
Alkaline Phosphatase: 57 U/L (ref 38–126)
Anion gap: 11 (ref 5–15)
BUN: 28 mg/dL — ABNORMAL HIGH (ref 8–23)
CO2: 22 mmol/L (ref 22–32)
Calcium: 8 mg/dL — ABNORMAL LOW (ref 8.9–10.3)
Chloride: 96 mmol/L — ABNORMAL LOW (ref 98–111)
Creatinine, Ser: 1.32 mg/dL — ABNORMAL HIGH (ref 0.61–1.24)
GFR, Estimated: 56 mL/min — ABNORMAL LOW (ref >60)
Glucose, Bld: 206 mg/dL — ABNORMAL HIGH (ref 70–99)
Potassium: 2.9 mmol/L — ABNORMAL LOW (ref 3.5–5.1)
Sodium: 129 mmol/L — ABNORMAL LOW (ref 135–145)
Total Bilirubin: 0.7 mg/dL (ref 0.3–1.2)
Total Protein: 5.2 g/dL — ABNORMAL LOW (ref 6.5–8.1)

## 2021-07-16 LAB — CBC
HCT: 35.7 % — ABNORMAL LOW (ref 39.0–52.0)
Hemoglobin: 12.1 g/dL — ABNORMAL LOW (ref 13.0–17.0)
MCH: 32.4 pg (ref 26.0–34.0)
MCHC: 33.9 g/dL (ref 30.0–36.0)
MCV: 95.7 fL (ref 80.0–100.0)
Platelets: 93 10*3/uL — ABNORMAL LOW (ref 150–400)
RBC: 3.73 MIL/uL — ABNORMAL LOW (ref 4.22–5.81)
RDW: 20.5 % — ABNORMAL HIGH (ref 11.5–15.5)
WBC: 2.4 10*3/uL — ABNORMAL LOW (ref 4.0–10.5)
nRBC: 1.3 % — ABNORMAL HIGH (ref 0.0–0.2)

## 2021-07-16 LAB — GLUCOSE, CAPILLARY: Glucose-Capillary: 117 mg/dL — ABNORMAL HIGH (ref 70–99)

## 2021-07-16 LAB — MAGNESIUM: Magnesium: 1.7 mg/dL (ref 1.7–2.4)

## 2021-07-16 MED ORDER — POTASSIUM CHLORIDE 10 MEQ/100ML IV SOLN
INTRAVENOUS | Status: AC
  Administered 2021-07-16: 10 meq via INTRAVENOUS
  Filled 2021-07-16: qty 100

## 2021-07-16 MED ORDER — POTASSIUM CHLORIDE 10 MEQ/100ML IV SOLN
10.0000 meq | INTRAVENOUS | Status: AC
Start: 2021-07-16 — End: 2021-07-16
  Administered 2021-07-16 (×4): 10 meq via INTRAVENOUS
  Filled 2021-07-16 (×3): qty 100

## 2021-07-16 MED ORDER — MAGNESIUM SULFATE 2 GM/50ML IV SOLN
2.0000 g | Freq: Once | INTRAVENOUS | Status: AC
  Administered 2021-07-16: 2 g via INTRAVENOUS
  Filled 2021-07-16: qty 50

## 2021-07-16 MED ORDER — PANTOPRAZOLE SODIUM 40 MG PO TBEC
40.0000 mg | DELAYED_RELEASE_TABLET | Freq: Two times a day (BID) | ORAL | Status: DC
  Administered 2021-07-16 – 2021-07-21 (×11): 40 mg via ORAL
  Filled 2021-07-16 (×8): qty 1
  Filled 2021-07-16: qty 2
  Filled 2021-07-16 (×2): qty 1

## 2021-07-16 MED ORDER — LACTATED RINGERS IV SOLN: INTRAVENOUS | Status: DC

## 2021-07-16 MED ORDER — LACTATED RINGERS IV BOLUS
1000.0000 mL | Freq: Once | INTRAVENOUS | Status: AC
  Administered 2021-07-16: 1000 mL via INTRAVENOUS

## 2021-07-16 NOTE — Progress Notes (Signed)
PROGRESS NOTE   Justin Trujillo.  DJM:[id number] DOB: [birth year] DOA: 07/15/2021 PCP: Carol Ada, MD  Brief Narrative:  75 year old white male OSA, CAD-low risk stress test 01/25/2021, DM TY 2, essential tremor on primidone, anxiety on Xanax, smoker 1 pack/day, HTN HLD reflux community dwelling male Known adenocarcinoma distal esophagus followed by Dr. Lorenso Courier since 02/06/2021 status post FOLFOX nivolumab-treatment complicated by diarrhea after each episode-follow-up palliative radiation for dysphagia-last seen in clinic, given cycle 6 of chemo 07/05/2021  Presented to emergency room 4 days nausea and vomiting on 07/15/2021 developed projectile continuous vomiting, dizziness lightheadedness Found to have blood pressure systolic 90, heart rate 222 Sodium 129 BUNs/creatinine from baseline 11/0.8-->29/1.5 Overnight did develop runs of VT and potassium aggressively replaced as was magnesium Oncology consulted   Hospital-Problem based course  Nausea vomiting Potentially secondary to active chemo or dysphagia-has had XRT to the throat area Seems resolved now how-tolerating liquids-we will graduate to soft diet If continues to tolerate will graduate diet further If he has further pain with swallowing or difficulty with swallowing we will require speech or GI eval Can continue pain meds Percocet 10 every 6 as needed for severe pain Episodic V. tach overnight 18 beats Telemetry reviewed seems to be predominantly in sinus rhythm no recurrences Replacing magnesium-keep on monitors overnight Pancytopenia All counts have decreased-get differential in a.m. Unlikely hematological process but monitor trends-oncology to comment if worsens Hyponatremia hypokalemia on admission with AKI Continue LR 125 cc/H-given 5 runs of potassium this a.m., magnesium noted to be 1.7 replace with 2 g Recheck labs a.m. Holding prior to admission lisinopril 5, Creatinine already improving-labs in the  morning Distal esophageal adenocarcinoma On FOLFOX and nivolumab status post XRT final treatment 04/05/2021 Oncology added to treatment team will monitor Stable CAD with low risk stress test 01/25/2021 ACE inhibitor on hold resume in the outpatient setting Overall stable at this time Lipitor 40 Off aspirin presumably secondary to thrombocytopenia BPH Is on hold at this time unlikely cause current issues-continue Flomax 0.4, Myrbetriq 50 daily is on hold at this time DM TY 2 A1c 5.9 Ozempic 0.2 weekly on hold Continue gabapentin 300 3 times daily and monitor on sliding scale sensitive Essential tremor Continue primidone 50 AM and at bedtime, Requip 0.5 at bedtime Depression Continue Xanax 0.5 twice daily as needed, Lexapro 10 daily Smoker Patient advised to quit-continue nicotine patch 21 every 24 hour    DVT prophylaxis: Lovenox Code Status: Full Family Communication: None Disposition:  Status is: Observation  The patient remains OBS appropriate and will d/c before 2 midnights.      Consultants:  Oncology  Procedures: No  Antimicrobials:   Subjective: Awake coherent tolerating liquids no chest pain no fever did not feel any instances of fast heartbeat-has never been told he has an irregular heartbeat No diarrhea no fever  Objective: Vitals:   07/15/21 1730 07/15/21 2027 07/15/21 2144 07/16/21 0300  BP: 93/61 107/77 98/78 90/64   Pulse: (!) 110 (!) 121 (!) 108 99  Resp: 19 19 18 18   Temp:  98.3 F (36.8 C) 98.5 F (36.9 C) 98.2 F (36.8 C)  TempSrc:   Oral Oral  SpO2: 91% 94% 95% 96%  Weight:      Height:        Intake/Output Summary (Last 24 hours) at 07/16/2021 0748 Last data filed at 07/15/2021 1505 Gross per 24 hour  Intake 2000 ml  Output --  Net 2000 ml   Filed Weights   07/15/21 1057  Weight: 77.1 kg    Examination:  Awake coherent white male looks about stated age no distress EOMI NCAT neck soft supple S1-S2 no murmur-monitors reviewed  as above Abdomen soft nontender no rebound no guarding ROM intact lower extremities no swellings Neurologically intact with no overall focal deficit  Data Reviewed: personally reviewed   CBC    Component Value Date/Time   WBC 2.4 (L) 07/16/2021 0159   RBC 3.73 (L) 07/16/2021 0159   HGB 12.1 (L) 07/16/2021 0159   HGB 11.4 (L) 07/05/2021 1048   HCT 35.7 (L) 07/16/2021 0159   PLT 93 (L) 07/16/2021 0159   PLT 126 (L) 07/05/2021 1048   MCV 95.7 07/16/2021 0159   MCH 32.4 07/16/2021 0159   MCHC 33.9 07/16/2021 0159   RDW 20.5 (H) 07/16/2021 0159   LYMPHSABS 0.1 (L) 07/15/2021 1048   MONOABS 0.5 07/15/2021 1048   EOSABS 0.0 07/15/2021 1048   BASOSABS 0.0 07/15/2021 1048   CMP Latest Ref Rng & Units 07/16/2021 07/15/2021 07/15/2021  Glucose 70 - 99 mg/dL 206(H) 214(H) 286(H)  BUN 8 - 23 mg/dL 28(H) 23 21  Creatinine 0.61 - 1.24 mg/dL 1.32(H) 1.44(H) 1.57(H)  Sodium 135 - 145 mmol/L 129(L) 132(L) 129(L)  Potassium 3.5 - 5.1 mmol/L 2.9(L) 3.6 3.6  Chloride 98 - 111 mmol/L 96(L) 95(L) 90(L)  CO2 22 - 32 mmol/L 22 24 22   Calcium 8.9 - 10.3 mg/dL 8.0(L) 8.2(L) 8.8(L)  Total Protein 6.5 - 8.1 g/dL 5.2(L) - 6.3(L)  Total Bilirubin 0.3 - 1.2 mg/dL 0.7 - 1.2  Alkaline Phos 38 - 126 U/L 57 - 69  AST 15 - 41 U/L 44(H) - 62(H)  ALT 0 - 44 U/L 37 - 42     Radiology Studies: No results found.   Scheduled Meds:  enoxaparin (LOVENOX) injection  40 mg Subcutaneous Q24H   escitalopram  10 mg Oral Daily   gabapentin  300 mg Oral TID   insulin aspart  0-9 Units Subcutaneous TID WC   nicotine  21 mg Transdermal Daily   pantoprazole (PROTONIX) IV  40 mg Intravenous Q24H   primidone  50 mg Oral BID   rOPINIRole  0.5 mg Oral QHS   tamsulosin  0.4 mg Oral Daily   Continuous Infusions:  lactated ringers 125 mL/hr at 07/16/21 0401   magnesium sulfate bolus IVPB     potassium chloride 10 mEq (07/16/21 0726)     LOS: 0 days   Time spent: [place], MD [facility] To contact the attending provider between 7A-7P or the covering provider during after hours 7P-7A, please log into the web site [email or web address] and access using universal [place] password for that web site. If you do not have the password, please call the hospital operator.  07/16/2021, 7:48 AM

## 2021-07-16 NOTE — Significant Event (Signed)
Called by RN: pt with 17 beat run of VT on monitor.  Currently SBP 90, HR 99.  Has had ongoing N/V and diarrhea this evening.  No hematemesis nor hematochezia though.  AM labs demonstrate slight HGB drop (probably dilutional), also demonstrate K of 2.9 down from 3.6.  1) 1L LR bolus now 2) convert NS maint IVF to LR 3) ordering 5 runs of IV K for replacement 4) check magnesium

## 2021-07-16 NOTE — Progress Notes (Signed)
Justin Trujillo.   DOB:24-Dec-1945   BO#:[id number]   IDP#:[id number]  Oncology follow up note   Subjective: Pt is known to our service, under Dr. Libby Trujillo care for his metastatic esophageal cancer.  He previously received palliative radiation, and on chemotherapy FOLFOX and nivo now, last treatment on 10/19.  He presented to hospital for recurrent nausea, vomiting for 4 days, not able to tolerate much oral food and liquid.  No fever or chills, bowel movement has been normal   Objective:  Vitals:   07/16/21 0300 07/16/21 0819  BP: 90/64 118/88  Pulse: 99 93  Resp: 18 16  Temp: 98.2 F (36.8 C) 98.1 F (36.7 C)  SpO2: 96% 100%    Body mass index is 27.44 kg/m.  Intake/Output Summary (Last 24 hours) at 07/16/2021 1403 Last data filed at 07/15/2021 1505 Gross per 24 hour  Intake 1000 ml  Output --  Net 1000 ml     Sclerae unicteric  Oropharynx clear  No peripheral adenopathy  Lungs clear -- no rales or rhonchi  Heart regular rate and rhythm  Abdomen benign  MSK no focal spinal tenderness, no peripheral edema  Neuro nonfocal    CBG (last 3)  No results for input(s): GLUCAP in the last 72 hours.   Labs:   Urine Studies No results for input(s): UHGB, CRYS in the last 72 hours.  Invalid input(s): UACOL, UAPR, USPG, UPH, UTP, UGL, UKET, UBIL, UNIT, UROB, ULEU, UEPI, UWBC, URBC, UBAC, CAST, [place], [place]  Basic Metabolic Panel: Recent Labs  Lab 07/15/21 1048 07/15/21 1711 07/16/21 0159 07/16/21 0516  NA 129* 132* 129*  --   K 3.6 3.6 2.9*  --   CL 90* 95* 96*  --   CO2 22 24 22   --   GLUCOSE 286* 214* 206*  --   BUN 21 23 28*  --   CREATININE 1.57* 1.44* 1.32*  --   CALCIUM 8.8* 8.2* 8.0*  --   MG  --   --   --  1.7   GFR Estimated Creatinine Clearance: 46.5 mL/min (A) (by C-G formula based on SCr of 1.32 mg/dL (H)). Liver Function Tests: Recent Labs  Lab 07/15/21 1048 07/16/21 0159  AST 62* 44*  ALT 42 37  ALKPHOS 69 57  BILITOT 1.2 0.7  PROT 6.3* 5.2*   ALBUMIN 2.9* 2.3*   Recent Labs  Lab 07/15/21 2140  LIPASE 16   No results for input(s): AMMONIA in the last 168 hours. Coagulation profile No results for input(s): INR, PROTIME in the last 168 hours.  CBC: Recent Labs  Lab 07/15/21 1048 07/16/21 0159  WBC 4.4 2.4*  NEUTROABS 3.7  --   HGB 14.2 12.1*  HCT 41.8 35.7*  MCV 95.9 95.7  PLT 114* 93*   Cardiac Enzymes: No results for input(s): CKTOTAL, CKMB, CKMBINDEX, TROPONINI in the last 168 hours. BNP: Invalid input(s): POCBNP CBG: No results for input(s): GLUCAP in the last 168 hours. D-Dimer No results for input(s): DDIMER in the last 72 hours. Hgb A1c Recent Labs    07/15/21 2140  HGBA1C 5.9*   Lipid Profile No results for input(s): CHOL, HDL, LDLCALC, TRIG, CHOLHDL, LDLDIRECT in the last 72 hours. Thyroid function studies No results for input(s): TSH, T4TOTAL, T3FREE, THYROIDAB in the last 72 hours.  Invalid input(s): FREET3 Anemia work up No results for input(s): VITAMINB12, FOLATE, FERRITIN, TIBC, IRON, RETICCTPCT in the last 72 hours. Microbiology Recent Results (from the past 240 hour(s))  Resp Panel  by RT-PCR (Flu A&B, Covid) Nasopharyngeal Swab     Status: None   Collection Time: 07/15/21 11:16 AM   Specimen: Nasopharyngeal Swab; Nasopharyngeal(NP) swabs in vial transport medium  Result Value Ref Range Status   SARS Coronavirus 2 by RT PCR NEGATIVE NEGATIVE Final    Comment: (NOTE) SARS-CoV-2 target nucleic acids are NOT DETECTED.  The SARS-CoV-2 RNA is generally detectable in upper respiratory specimens during the acute phase of infection. The lowest concentration of SARS-CoV-2 viral copies this assay can detect is 138 copies/mL. A negative result does not preclude SARS-Cov-2 infection and should not be used as the sole basis for treatment or other patient management decisions. A negative result may occur with  improper specimen collection/handling, submission of specimen other than  nasopharyngeal swab, presence of viral mutation(s) within the areas targeted by this assay, and inadequate number of viral copies(<138 copies/mL). A negative result must be combined with clinical observations, patient history, and epidemiological information. The expected result is Negative.  Fact Sheet for Patients:  EntrepreneurPulse.com.au  Fact Sheet for Healthcare Providers:  IncredibleEmployment.be  This test is no t yet approved or cleared by the [place] FDA and  has been authorized for detection and/or diagnosis of SARS-CoV-2 by FDA under an Emergency Use Authorization (EUA). This EUA will remain  in effect (meaning this test can be used) for the duration of the COVID-19 declaration under Section 564(b)(1) of the Act, 21 U.S.C.section 360bbb-3(b)(1), unless the authorization is terminated  or revoked sooner.       Influenza A by PCR NEGATIVE NEGATIVE Final   Influenza B by PCR NEGATIVE NEGATIVE Final    Comment: (NOTE) The Xpert Xpress SARS-CoV-2/FLU/RSV plus assay is intended as an aid in the diagnosis of influenza from Nasopharyngeal swab specimens and should not be used as a sole basis for treatment. Nasal washings and aspirates are unacceptable for Xpert Xpress SARS-CoV-2/FLU/RSV testing.  Fact Sheet for Patients: EntrepreneurPulse.com.au  Fact Sheet for Healthcare Providers: IncredibleEmployment.be  This test is not yet approved or cleared by the [place] FDA and has been authorized for detection and/or diagnosis of SARS-CoV-2 by FDA under an Emergency Use Authorization (EUA). This EUA will remain in effect (meaning this test can be used) for the duration of the COVID-19 declaration under Section 564(b)(1) of the Act, 21 U.S.C. section 360bbb-3(b)(1), unless the authorization is terminated or revoked.  Performed at [facility], [place] [address] [place], [place] [address]        Studies:  No results found.  Assessment: 75 y.o. 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  -his nausea and vomiting is not typical from chemo, certainly delay N/V can happen after chemo.  Due to his previous radiation, I wonder if he has esophageal stricture from radiation.  Please consult medical GI, Dr. Estanislado Emms is his primary gastroenterologist. -I talked to his wife at bedside -Dr. Lorenso Courier will f/u tomorrow    Truitt Merle, MD 07/16/2021  2:03 PM

## 2021-07-17 ENCOUNTER — Inpatient Hospital Stay (HOSPITAL_COMMUNITY): Payer: PPO

## 2021-07-17 DIAGNOSIS — E860 Dehydration: Secondary | ICD-10-CM | POA: Diagnosis not present

## 2021-07-17 LAB — COMPREHENSIVE METABOLIC PANEL
ALT: 26 U/L (ref 0–44)
AST: 30 U/L (ref 15–41)
Albumin: 1.9 g/dL — ABNORMAL LOW (ref 3.5–5.0)
Alkaline Phosphatase: 49 U/L (ref 38–126)
Anion gap: 7 (ref 5–15)
BUN: 18 mg/dL (ref 8–23)
CO2: 23 mmol/L (ref 22–32)
Calcium: 7.7 mg/dL — ABNORMAL LOW (ref 8.9–10.3)
Chloride: 95 mmol/L — ABNORMAL LOW (ref 98–111)
Creatinine, Ser: 0.94 mg/dL (ref 0.61–1.24)
GFR, Estimated: >60 mL/min (ref >60)
Glucose, Bld: 118 mg/dL — ABNORMAL HIGH (ref 70–99)
Potassium: 2.9 mmol/L — ABNORMAL LOW (ref 3.5–5.1)
Sodium: 125 mmol/L — ABNORMAL LOW (ref 135–145)
Total Bilirubin: 0.6 mg/dL (ref 0.3–1.2)
Total Protein: 4.4 g/dL — ABNORMAL LOW (ref 6.5–8.1)

## 2021-07-17 LAB — CBC WITH DIFFERENTIAL/PLATELET
Abs Immature Granulocytes: 0.01 10*3/uL (ref 0.00–0.07)
Basophils Absolute: 0 10*3/uL (ref 0.0–0.1)
Basophils Relative: 0 %
Eosinophils Absolute: 0 10*3/uL (ref 0.0–0.5)
Eosinophils Relative: 2 %
HCT: 31.6 % — ABNORMAL LOW (ref 39.0–52.0)
Hemoglobin: 10.7 g/dL — ABNORMAL LOW (ref 13.0–17.0)
Immature Granulocytes: 1 %
Lymphocytes Relative: 11 %
Lymphs Abs: 0.1 10*3/uL — ABNORMAL LOW (ref 0.7–4.0)
MCH: 31.9 pg (ref 26.0–34.0)
MCHC: 33.9 g/dL (ref 30.0–36.0)
MCV: 94.3 fL (ref 80.0–100.0)
Monocytes Absolute: 0.4 10*3/uL (ref 0.1–1.0)
Monocytes Relative: 33 %
Neutro Abs: 0.6 10*3/uL — ABNORMAL LOW (ref 1.7–7.7)
Neutrophils Relative %: 53 %
Platelets: 81 10*3/uL — ABNORMAL LOW (ref 150–400)
RBC: 3.35 MIL/uL — ABNORMAL LOW (ref 4.22–5.81)
RDW: 19.6 % — ABNORMAL HIGH (ref 11.5–15.5)
WBC: 1.2 10*3/uL — CL (ref 4.0–10.5)
nRBC: 1.7 % — ABNORMAL HIGH (ref 0.0–0.2)

## 2021-07-17 LAB — SODIUM, URINE, RANDOM: Sodium, Ur: <10 mmol/L

## 2021-07-17 LAB — MAGNESIUM: Magnesium: 2 mg/dL (ref 1.7–2.4)

## 2021-07-17 LAB — OSMOLALITY, URINE: Osmolality, Ur: 675 mOsm/kg (ref 300–900)

## 2021-07-17 LAB — PATHOLOGIST SMEAR REVIEW

## 2021-07-17 LAB — GLUCOSE, CAPILLARY
Glucose-Capillary: 143 mg/dL — ABNORMAL HIGH (ref 70–99)
Glucose-Capillary: 137 mg/dL — ABNORMAL HIGH (ref 70–99)
Glucose-Capillary: 139 mg/dL — ABNORMAL HIGH (ref 70–99)
Glucose-Capillary: 132 mg/dL — ABNORMAL HIGH (ref 70–99)

## 2021-07-17 MED ORDER — POTASSIUM CHLORIDE IN NACL 40-0.9 MEQ/L-% IV SOLN
INTRAVENOUS | Status: DC
  Filled 2021-07-17 (×5): qty 1000

## 2021-07-17 MED ORDER — TBO-FILGRASTIM 480 MCG/0.8ML ~~LOC~~ SOSY
480.0000 ug | PREFILLED_SYRINGE | Freq: Every day | SUBCUTANEOUS | Status: AC
  Administered 2021-07-18: 480 ug via SUBCUTANEOUS
  Filled 2021-07-17 (×4): qty 0.8

## 2021-07-17 MED ORDER — POTASSIUM CHLORIDE CRYS ER 20 MEQ PO TBCR
40.0000 meq | EXTENDED_RELEASE_TABLET | Freq: Two times a day (BID) | ORAL | Status: DC
  Administered 2021-07-17 – 2021-07-19 (×5): 40 meq via ORAL
  Filled 2021-07-17 (×5): qty 2

## 2021-07-17 NOTE — Progress Notes (Signed)
PROGRESS NOTE   Justin Trujillo.  OVF:[id number] DOB: Aug 25, 1946 DOA: 07/15/2021 PCP: Carol Ada, MD  Brief Narrative:  75 year old white male OSA, CAD-low risk stress test 01/25/2021, DM TY 2, essential tremor on primidone, anxiety on Xanax, smoker 1 pack/day, HTN HLD reflux community dwelling male Known adenocarcinoma distal esophagus followed by Dr. Lorenso Courier since 02/06/2021 status post FOLFOX nivolumab-treatment complicated by diarrhea after each episode-follow-up palliative radiation for dysphagia-last seen in clinic, given cycle 6 of chemo 07/05/2021  Presented to emergency room 4 days nausea and vomiting on 07/15/2021 developed projectile continuous vomiting, dizziness lightheadedness Found to have blood pressure systolic 90, heart rate 841 Sodium 129 BUNs/creatinine from baseline 11/0.8-->29/1.5 Overnight did develop runs of VT and potassium aggressively replaced as was magnesium Oncology consulted   Hospital-Problem based course  Nausea vomiting Potentially secondary to active chemo or dysphagia-has had XRT to the throat area Barium swallow allows 13 mm pill so this is unlikely secondary to organic cause and GI recommend slow graduation of diet-he is still having nausea vomiting but no diarrhea-expect we will be able to allow him to eat a full diet soon Can continue pain meds Percocet 10 every 6 as needed for severe pain Hyponatremia hypokalemia on admission with AKI Likely secondary to mainly free solute without any electrolyte sent have encouraged him to take Gatorade etc. Fluid restrict to 1800 cc--continue saline with 40 of K and oral potassium 40 twice daily Still pending urine sodium Episodic V. tach overnight 18 beats Telemetry reviewed seems to be predominantly in sinus rhythm no recurrences Replacing magnesium--now repleted Pancytopenia All counts have decreased-get differential in a.m. Giving granix 480 per oncology Rpt Diff in am--monitor for need for neutropenic  precaution Distal esophageal adenocarcinoma On FOLFOX and nivolumab status post XRT final treatment 04/05/2021 Appreciate onc input Stable CAD with low risk stress test 01/25/2021 ACE inhibitor on hold resume in the outpatient setting Overall stable at this time Lipitor 40 Off aspirin presumably secondary to thrombocytopenia BPH Is on hold at this time unlikely cause current issues-continue Flomax 0.4, Myrbetriq 50 daily is on hold at this time DM TY 2 A1c 5.9 Ozempic 0.2 weekly on hold Continue gabapentin 300 3 times daily and monitor on sliding scale sensitive--sugars 130-150 Essential tremor Continue primidone 50 AM and at bedtime, Requip 0.5 at bedtime Depression Continue Xanax 0.5 twice daily as needed, Lexapro 10 daily Smoker Patient advised to quit-continue nicotine patch 21 every 24 hour    DVT prophylaxis: Lovenox Code Status: Full Family Communication: None Disposition:  Status is: Observation  The patient remains OBS appropriate and will d/c before 2 midnights.      Consultants:  Oncology  Procedures: No  Antimicrobials:   Subjective:  N/v again after scope Mainly taking in fluids No cp Semi-formed stool   Objective: Vitals:   07/16/21 1530 07/16/21 2300 07/17/21 0932 07/17/21 1316  BP: 116/63 107/77 100/73 91/65  Pulse: 97 99 98 100  Resp: 16 18 17 19   Temp: 98.4 F (36.9 C) 98.3 F (36.8 C) 98 F (36.7 C) 98.4 F (36.9 C)  TempSrc: Oral Oral  Oral  SpO2: (!) 89% 94% 92% 93%  Weight:      Height:       No intake or output data in the 24 hours ending 07/17/21 1528  Filed Weights   07/15/21 1057  Weight: 77.1 kg    Examination:  coherent white male looks about stated age no distress EOMI NCAT neck soft supple S1-S2 no murmur-monitors  reviewed --no new issue Abdomen soft nontender no rebound no guarding ROM intact lower extremities no swellings Neurologically intact with no overall focal deficit  Data Reviewed: personally reviewed    CBC    Component Value Date/Time   WBC 1.2 (LL) 07/17/2021 0225   RBC 3.35 (L) 07/17/2021 0225   HGB 10.7 (L) 07/17/2021 0225   HGB 11.4 (L) 07/05/2021 1048   HCT 31.6 (L) 07/17/2021 0225   PLT 81 (L) 07/17/2021 0225   PLT 126 (L) 07/05/2021 1048   MCV 94.3 07/17/2021 0225   MCH 31.9 07/17/2021 0225   MCHC 33.9 07/17/2021 0225   RDW 19.6 (H) 07/17/2021 0225   LYMPHSABS 0.1 (L) 07/17/2021 0225   MONOABS 0.4 07/17/2021 0225   EOSABS 0.0 07/17/2021 0225   BASOSABS 0.0 07/17/2021 0225   CMP Latest Ref Rng & Units 07/17/2021 07/16/2021 07/15/2021  Glucose 70 - 99 mg/dL 118(H) 206(H) 214(H)  BUN 8 - 23 mg/dL 18 28(H) 23  Creatinine 0.61 - 1.24 mg/dL 0.94 1.32(H) 1.44(H)  Sodium 135 - 145 mmol/L 125(L) 129(L) 132(L)  Potassium 3.5 - 5.1 mmol/L 2.9(L) 2.9(L) 3.6  Chloride 98 - 111 mmol/L 95(L) 96(L) 95(L)  CO2 22 - 32 mmol/L 23 22 24   Calcium 8.9 - 10.3 mg/dL 7.7(L) 8.0(L) 8.2(L)  Total Protein 6.5 - 8.1 g/dL 4.4(L) 5.2(L) -  Total Bilirubin 0.3 - 1.2 mg/dL 0.6 0.7 -  Alkaline Phos 38 - 126 U/L 49 57 -  AST 15 - 41 U/L 30 44(H) -  ALT 0 - 44 U/L 26 37 -     Radiology Studies: DG ESOPHAGUS W SINGLE CM (SOL OR THIN BA)  Result Date: 07/17/2021 CLINICAL DATA:  History of esophageal cancer with nausea and vomiting. EXAM: ESOPHAGUS/BARIUM SWALLOW TECHNIQUE: Single contrast examination was performed using thin liquid barium. This exam was performed by Soyla Dryer, and was supervised and interpreted by Abigail Miyamoto. FLUOROSCOPY TIME:  Radiation Exposure Index (as provided by the fluoroscopic device): Not applicable. If the device does not provide the exposure index: Fluoroscopy Time:  1 minute and 36 seconds Number of Acquired Images:  None COMPARISON:  Chest CT 07/13/2021 FINDINGS: Focused exam was performed with the patient supine and in various obliquities. Focused single-contrast exam performed. Full column evaluation of the esophagus demonstrates no persistent narrowing or  stricture. Spontaneous gastroesophageal reflux is identified to the level of the clavicles. A 13 mm barium tablet passes promptly. IMPRESSION: Single-contrast, focused exam performed as detailed above. No esophageal stricture. Spontaneous gastroesophageal reflux. Electronically Signed   By: Abigail Miyamoto M.D.   On: 07/17/2021 09:48     Scheduled Meds:  enoxaparin (LOVENOX) injection  40 mg Subcutaneous Q24H   escitalopram  10 mg Oral Daily   gabapentin  300 mg Oral TID   insulin aspart  0-9 Units Subcutaneous TID WC   nicotine  21 mg Transdermal Daily   pantoprazole  40 mg Oral BID   potassium chloride  40 mEq Oral BID   primidone  50 mg Oral BID   rOPINIRole  0.5 mg Oral QHS   tamsulosin  0.4 mg Oral Daily   Tbo-filgastrim (GRANIX) SQ  480 mcg Subcutaneous q1800   Continuous Infusions:  0.9 % NaCl with KCl 40 mEq / L 100 mL/hr at 07/17/21 0935     LOS: 1 day   Time spent: [place], MD [facility] To contact the attending provider between 7A-7P or the covering provider during after hours 7P-7A, please log into  the web site [email or web address] and access using universal [place] password for that web site. If you do not have the password, please call the hospital operator.  07/17/2021, 3:28 PM

## 2021-07-17 NOTE — Consult Note (Signed)
Referring Provider:  [facility] Primary Care Physician:  Carol Ada, MD Primary Gastroenterologist:  Dr. Watt Climes  Reason for Consultation: Nausea and vomiting, history of esophageal cancer  HPI: Justin Trujillo. is a 75 y.o. male who underwent EGD for evaluation of dysphagia on February 15, 2021 and was found to have distal esophageal mass.  Biopsy showed poorly differentiated adenocarcinoma.  Currently followed by oncology.  He received palliative radiation and was recently given chemotherapy with FOLFOX and NIVO.  He presented to the hospital on July 15, 2021 with nausea and vomiting.  GI is consulted for further evaluation.  CT chest abdomen pelvis with contrast on July 15, 2021 showed stable esophageal cancer otherwise no findings suspicious for metastatic disease and no acute changes.  Lab revealed normal LFTs, low albumin at 1.9, low white count at 1.2, hemoglobin of 10.7, normal lipase.  Patient seen and examined at bedside.  He is already feeling better.  Nausea has improved.  No further vomiting.  Diarrhea has resolved.  Complaining of mild periumbilical discomfort.  Past Medical History:  Diagnosis Date   Arthritis    Cancer ([place])    Coronary atherosclerosis of native coronary artery    Depression    Esophageal cancer (HCC)    Essential tremor    High cholesterol    Hypertension    Hypertension    Hypogonadism male    Obstructive chronic bronchitis with exacerbation (HCC)    Obstructive sleep apnea (adult) (pediatric)    no cpap   Other and unspecified hyperlipidemia    Other emphysema ([place])    Proteinuria 2019   has seen a nephrologist   Shortness of breath    with excertion   Situational stress    Type II or unspecified type diabetes mellitus without mention of complication, not stated as uncontrolled    type 2   Unspecified essential hypertension     Past Surgical History:  Procedure Laterality Date   CARDIAC CATHETERIZATION     IR IMAGING GUIDED PORT INSERTION   04/24/2021   IR KYPHO LUMBAR INC FX REDUCE BONE BX UNI/BIL CANNULATION INC/IMAGING  06/13/2018   L lens implant     R knee replacement x2     REVERSE SHOULDER ARTHROPLASTY Left 03/26/2019   Procedure: REVERSE SHOULDER ARTHROPLASTY;  Surgeon: Justice Britain, MD;  Location: [facility];  Service: Orthopedics;  Laterality: Left;  113min   TONSILLECTOMY     TONSILLECTOMY AND ADENOIDECTOMY     vascectomy      Prior to Admission medications   Medication Sig Start Date End Date Taking? Authorizing Provider  acetaminophen (TYLENOL) 500 MG tablet Take 650 mg by mouth every 8 (eight) hours as needed for mild pain (pain).   Yes [provider]  albuterol (VENTOLIN HFA) 108 (90 Base) MCG/ACT inhaler Inhale 1 puff into the lungs every 6 (six) hours as needed for wheezing. 04/24/21  Yes [provider]  ALPRAZolam Duanne Moron) 0.5 MG tablet Take 0.5 mg by mouth 2 (two) times daily as needed for anxiety.   Yes [provider]  atorvastatin (LIPITOR) 40 MG tablet Take 1 tablet (40 mg total) by mouth daily. 07/23/12  Yes Weber, Damaris Hippo, PA-C  Calcium Carb-Cholecalciferol (CALCIUM 600+D3 PO) Take 1 tablet by mouth daily.   Yes [provider]  Cholecalciferol (VITAMIN D) 50 MCG (2000 UT) tablet Take 2,000 Units by mouth daily.   Yes [provider]  escitalopram (LEXAPRO) 10 MG tablet Take 10 mg by mouth daily.  Yes [provider]  gabapentin (NEURONTIN) 300 MG capsule Take 2 capsules (600 mg total) by mouth 3 (three) times daily. Patient taking differently: Take 300 mg by mouth 3 (three) times daily. 10/31/18  Yes Kristeen Miss, MD  lidocaine-prilocaine (EMLA) cream Apply 1 application topically as needed. 04/24/21  Yes Orson Slick, MD  Multiple Vitamins-Minerals (CENTRUM SILVER) tablet daily in the afternoon.   Yes [provider]  MYRBETRIQ 50 MG TB24 tablet Take 50 mg by mouth daily. 12/26/20  Yes [provider]  ondansetron (ZOFRAN) 8 MG tablet  Take 1 tablet (8 mg total) by mouth every 8 (eight) hours as needed for nausea or vomiting. 04/24/21  Yes Orson Slick, MD  oxyCODONE-acetaminophen (PERCOCET) 10-325 MG tablet Take 1 tablet by mouth every 6 (six) hours as needed. 06/01/21  Yes [provider]  primidone (MYSOLINE) 50 MG tablet Take 1 tablet (50 mg total) by mouth in the morning and at bedtime. 06/21/21  Yes Tat, Eustace Quail, DO  prochlorperazine (COMPAZINE) 10 MG tablet Take 1 tablet (10 mg total) by mouth every 6 (six) hours as needed for nausea or vomiting. 04/24/21  Yes Orson Slick, MD  rOPINIRole (REQUIP) 0.5 MG tablet Take 0.5 mg by mouth at bedtime.   Yes [provider]  Semaglutide,0.25 or 0.5MG /DOS, (OZEMPIC, 0.25 OR 0.5 MG/DOSE,) 2 MG/1.5ML SOPN Ozempic 0.25 mg or 0.5 mg (2 mg/1.5 mL) subcutaneous pen injector  INJECT 0.5 MG SUBCUTANEOUSLY ONCE WEEKLY   Yes [provider]  tamsulosin (FLOMAX) 0.4 MG CAPS capsule Take 0.4 mg by mouth daily. 01/03/21  Yes [provider]  TESTOSTERONE IM Inject 1 mL into the muscle every 14 (fourteen) days.   Yes [provider]  tiZANidine (ZANAFLEX) 4 MG tablet Take 4 mg by mouth 3 (three) times daily as needed. 01/23/21  Yes [provider]  trolamine salicylate (ASPERCREME) 10 % cream Apply 1 application topically 2 (two) times daily as needed for muscle pain.    Yes [provider]  lisinopril (ZESTRIL) 5 MG tablet Take 5 mg by mouth daily. Patient not taking: No sig reported 06/13/21   [provider]  predniSONE (DELTASONE) 50 MG tablet Take 1 tablet 13 hours, 7 hours and 1 hour prior to CT Scan 07/12/21   Orson Slick, MD  sulfamethoxazole-trimethoprim (BACTRIM DS) 800-160 MG tablet Take 1 tablet by mouth 2 (two) times daily. Patient not taking: Reported on 07/15/2021 06/06/21   [provider]    Scheduled Meds:  enoxaparin (LOVENOX) injection  40 mg Subcutaneous Q24H   escitalopram  10 mg Oral  Daily   gabapentin  300 mg Oral TID   insulin aspart  0-9 Units Subcutaneous TID WC   nicotine  21 mg Transdermal Daily   pantoprazole  40 mg Oral BID   potassium chloride  40 mEq Oral BID   primidone  50 mg Oral BID   rOPINIRole  0.5 mg Oral QHS   tamsulosin  0.4 mg Oral Daily   Continuous Infusions:  0.9 % NaCl with KCl 40 mEq / L     PRN Meds:.acetaminophen, albuterol, ALPRAZolam, morphine injection, ondansetron **OR** ondansetron (ZOFRAN) IV, oxyCODONE-acetaminophen **AND** oxyCODONE, prochlorperazine, tiZANidine  Allergies as of 07/15/2021 - Review Complete 07/15/2021  Allergen Reaction Noted   Iodinated diagnostic agents Anaphylaxis 08/25/2014   Moxifloxacin Swelling    Penicillins Anaphylaxis 10/02/2011   Shellfish-derived products Anaphylaxis 08/25/2014   Amoxicillin Other (See Comments) 01/24/2021  Other Other (See Comments) 01/24/2021   Latex Rash     Family History  Problem Relation Age of Onset   Emphysema Father    Heart disease Father    Clotting disorder Mother    Kidney cancer Child     Social History   Socioeconomic History   Marital status: Married    Spouse name: Not on file   Number of children: Not on file   Years of education: Not on file   Highest education level: Not on file  Occupational History   Occupation: Landscape architect  Tobacco Use   Smoking status: Every Day    Packs/day: 1.00    Years: 55.00    Pack years: 55.00    Types: Cigarettes   Smokeless tobacco: Never  Vaping Use   Vaping Use: Never used  Substance and Sexual Activity   Alcohol use: Yes    Alcohol/week: 1.0 standard drink    Types: 1 Cans of beer per week    Comment: occasional beer   Drug use: Never   Sexual activity: Not Currently  Other Topics Concern   Not on file  Social History Narrative   ** Merged History Encounter **       Social Determinants of Health   Financial Resource Strain: Not on file  Food Insecurity: Not on file   Transportation Needs: Not on file  Physical Activity: Not on file  Stress: Not on file  Social Connections: Not on file  Intimate Partner Violence: Not on file    Review of Systems: All negative except as stated above in HPI.  Physical Exam: Vital signs: Vitals:   07/16/21 1530 07/16/21 2300  BP: 116/63 107/77  Pulse: 97 99  Resp: 16 18  Temp: 98.4 F (36.9 C) 98.3 F (36.8 C)  SpO2: (!) 89% 94%   Last BM Date: 07/16/21 General:   Alert,  Well-developed, well-nourished, pleasant and cooperative in NAD Lungs:  Clear throughout to auscultation.   No wheezes, crackles, or rhonchi. No acute distress. Heart:  Regular rate and rhythm, murmur noted Abdomen: Soft, nontender, nondistended, bowel sounds present, no peritoneal signs Rectal:  Deferred  GI:  Lab Results: Recent Labs    07/15/21 1048 07/16/21 0159 07/17/21 0225  WBC 4.4 2.4* 1.2*  HGB 14.2 12.1* 10.7*  HCT 41.8 35.7* 31.6*  PLT 114* 93* 81*   BMET Recent Labs    07/15/21 1711 07/16/21 0159 07/17/21 0225  NA 132* 129* 125*  K 3.6 2.9* 2.9*  CL 95* 96* 95*  CO2 24 22 23   GLUCOSE 214* 206* 118*  BUN 23 28* 18  CREATININE 1.44* 1.32* 0.94  CALCIUM 8.2* 8.0* 7.7*   LFT Recent Labs    07/17/21 0225  PROT 4.4*  ALBUMIN 1.9*  AST 30  ALT 26  ALKPHOS 49  BILITOT 0.6   PT/INR No results for input(s): LABPROT, INR in the last 72 hours.   Studies/Results: No results found.  Impression/Plan: -Distal esophageal adenocarcinoma.  S/p radiation and chemotherapy.  Presented with nausea vomiting and diarrhea.  Symptoms improving. CT negative for acute changes.  Recommendations ---------------------------- -Barium swallow negative for stricture.  13 mm tablet passed promptly.  Patient is feeling better. -Continue supportive care -Slowly advance diet as tolerated -Discussed with hospitalist -GI will follow    LOS: 1 day   Otis Brace  MD, FACP 07/17/2021, 9:02 AM  Contact #  [phone]

## 2021-07-17 NOTE — Plan of Care (Signed)

## 2021-07-18 ENCOUNTER — Telehealth: Payer: Self-pay | Admitting: *Deleted

## 2021-07-18 ENCOUNTER — Inpatient Hospital Stay (HOSPITAL_COMMUNITY): Payer: PPO

## 2021-07-18 DIAGNOSIS — R112 Nausea with vomiting, unspecified: Secondary | ICD-10-CM | POA: Diagnosis not present

## 2021-07-18 DIAGNOSIS — E860 Dehydration: Secondary | ICD-10-CM | POA: Diagnosis not present

## 2021-07-18 DIAGNOSIS — C155 Malignant neoplasm of lower third of esophagus: Secondary | ICD-10-CM

## 2021-07-18 DIAGNOSIS — I4891 Unspecified atrial fibrillation: Secondary | ICD-10-CM | POA: Diagnosis not present

## 2021-07-18 DIAGNOSIS — T451X5A Adverse effect of antineoplastic and immunosuppressive drugs, initial encounter: Secondary | ICD-10-CM

## 2021-07-18 DIAGNOSIS — E1169 Type 2 diabetes mellitus with other specified complication: Secondary | ICD-10-CM

## 2021-07-18 LAB — CBC WITH DIFFERENTIAL/PLATELET
Abs Immature Granulocytes: 0.12 10*3/uL — ABNORMAL HIGH (ref 0.00–0.07)
Basophils Absolute: 0 10*3/uL (ref 0.0–0.1)
Basophils Relative: 1 %
Eosinophils Absolute: 0 10*3/uL (ref 0.0–0.5)
Eosinophils Relative: 1 %
HCT: 33 % — ABNORMAL LOW (ref 39.0–52.0)
Hemoglobin: 11.5 g/dL — ABNORMAL LOW (ref 13.0–17.0)
Immature Granulocytes: 6 %
Lymphocytes Relative: 11 %
Lymphs Abs: 0.2 10*3/uL — ABNORMAL LOW (ref 0.7–4.0)
MCH: 32.3 pg (ref 26.0–34.0)
MCHC: 34.8 g/dL (ref 30.0–36.0)
MCV: 92.7 fL (ref 80.0–100.0)
Monocytes Absolute: 0.6 10*3/uL (ref 0.1–1.0)
Monocytes Relative: 31 %
Neutro Abs: 0.9 10*3/uL — ABNORMAL LOW (ref 1.7–7.7)
Neutrophils Relative %: 50 %
Platelets: 78 10*3/uL — ABNORMAL LOW (ref 150–400)
RBC: 3.56 MIL/uL — ABNORMAL LOW (ref 4.22–5.81)
RDW: 19.5 % — ABNORMAL HIGH (ref 11.5–15.5)
WBC Morphology: INCREASED
WBC: 1.9 10*3/uL — ABNORMAL LOW (ref 4.0–10.5)
nRBC: 0 % (ref 0.0–0.2)

## 2021-07-18 LAB — COMPREHENSIVE METABOLIC PANEL
ALT: 22 U/L (ref 0–44)
AST: 25 U/L (ref 15–41)
Albumin: 1.9 g/dL — ABNORMAL LOW (ref 3.5–5.0)
Alkaline Phosphatase: 47 U/L (ref 38–126)
Anion gap: 7 (ref 5–15)
BUN: 14 mg/dL (ref 8–23)
CO2: 20 mmol/L — ABNORMAL LOW (ref 22–32)
Calcium: 7.5 mg/dL — ABNORMAL LOW (ref 8.9–10.3)
Chloride: 100 mmol/L (ref 98–111)
Creatinine, Ser: 0.76 mg/dL (ref 0.61–1.24)
GFR, Estimated: >60 mL/min (ref >60)
Glucose, Bld: 100 mg/dL — ABNORMAL HIGH (ref 70–99)
Potassium: 3.6 mmol/L (ref 3.5–5.1)
Sodium: 127 mmol/L — ABNORMAL LOW (ref 135–145)
Total Bilirubin: 0.6 mg/dL (ref 0.3–1.2)
Total Protein: 4.4 g/dL — ABNORMAL LOW (ref 6.5–8.1)

## 2021-07-18 LAB — ECHOCARDIOGRAM COMPLETE
AV Mean grad: 6.7 mmHg
AV Peak grad: 16.2 mmHg
Ao pk vel: 2.01 m/s
Height: 66 in
S' Lateral: 2.5 cm
Weight: 2720 oz

## 2021-07-18 LAB — GLUCOSE, CAPILLARY
Glucose-Capillary: 107 mg/dL — ABNORMAL HIGH (ref 70–99)
Glucose-Capillary: 90 mg/dL (ref 70–99)
Glucose-Capillary: 87 mg/dL (ref 70–99)
Glucose-Capillary: 99 mg/dL (ref 70–99)

## 2021-07-18 LAB — MAGNESIUM: Magnesium: 1.9 mg/dL (ref 1.7–2.4)

## 2021-07-18 MED ORDER — ONDANSETRON HCL 4 MG/2ML IJ SOLN
4.0000 mg | Freq: Three times a day (TID) | INTRAMUSCULAR | Status: DC
  Administered 2021-07-18 – 2021-07-20 (×6): 4 mg via INTRAVENOUS
  Filled 2021-07-18 (×6): qty 2

## 2021-07-18 MED ORDER — LOPERAMIDE HCL 1 MG/7.5ML PO SUSP
1.0000 mg | Freq: Once | ORAL | Status: AC
  Administered 2021-07-18: 1 mg via ORAL
  Filled 2021-07-18: qty 7.5

## 2021-07-18 MED ORDER — RISAQUAD PO CAPS
1.0000 | ORAL_CAPSULE | Freq: Every day | ORAL | Status: DC
  Administered 2021-07-18 – 2021-07-21 (×4): 1 via ORAL
  Filled 2021-07-18 (×4): qty 1

## 2021-07-18 MED ORDER — SUCRALFATE 1 GM/10ML PO SUSP
1.0000 g | Freq: Three times a day (TID) | ORAL | Status: DC
  Administered 2021-07-18 – 2021-07-21 (×12): 1 g via ORAL
  Filled 2021-07-18 (×16): qty 10

## 2021-07-18 MED ORDER — LOPERAMIDE HCL 2 MG PO CAPS
2.0000 mg | ORAL_CAPSULE | ORAL | Status: DC | PRN
  Administered 2021-07-18 – 2021-07-20 (×2): 2 mg via ORAL
  Filled 2021-07-18 (×2): qty 1

## 2021-07-18 NOTE — Progress Notes (Signed)
PROGRESS NOTE        PATIENT DETAILS Name: Justin Trujillo. Age: 75 y.o. Sex: male Date of Birth: [birth year] Admit Date: 07/15/2021 Admitting Physician Orma Flaming, MD MEQ:[facility], Hal Hope, MD  Brief Narrative: Patient is a 75 y.o. male with history of metastatic esophageal cancer-s/p palliative radiation-on chemotherapy-presented to the hospital with recurrent nausea/vomiting.  See below for further details.  Vomited earlier this morning.  1 episode of nausea.  Subjective: Lying comfortably in bed-denies any chest pain or shortness of breath.  Objective: Vitals: Blood pressure 127/79, pulse (!) 102, temperature 98.5 F (36.9 C), temperature source Oral, resp. rate 18, height 5\' 6"  (1.676 m), weight 77.1 kg, SpO2 96 %.   Exam: Gen Exam:Alert awake-not in any distress HEENT:atraumatic, normocephalic Chest: B/L clear to auscultation anteriorly CVS:S1S2 regular Abdomen:soft non tender, non distended Extremities:no edema Neurology: Non focal Skin: no rash  Pertinent Labs/Radiology: Recent Labs  Lab 07/15/21 1048 07/15/21 1711 07/16/21 0159 07/17/21 0225 07/18/21 0229  WBC 4.4  --  2.4* 1.2* 1.9*  HGB 14.2  --  12.1* 10.7* 11.5*  PLT 114*  --  93* 81* 78*  NA 129*   < > 129* 125* 127*  K 3.6   < > 2.9* 2.9* 3.6  CREATININE 1.57*   < > 1.32* 0.94 0.76  AST 62*  --  44* 30 25  ALT 42  --  37 26 22  ALKPHOS 69  --  57 49 47  BILITOT 1.2  --  0.7 0.6 0.6   < > = values in this interval not displayed.     10/27>> CT chest/abdomen/pelvis: Soft tissue thickening in the distal esophagus-no suspicious findings of metastatic disease. 10/27>> CT soft tissue neck: No new adenopathy-no acute abnormalities.   10/31>> barium esophagogram.  No esophageal stricture.  Assessment/Plan: Intractable nausea/vomiting: Due to underlying malignancy/recent chemotherapy.  Barium esophagogram negative for strictures.  CT abdomen negative for SBO.  Continues to  have nausea and vomiting-we will change Zofran to scheduled dosing-continue as needed Compazine.    Epigastric pain: Due to reflux/nausea/vomiting-continue PPI-add Carafate.  Hyponatremia: Due to dehydration-continue IVF-and follow.  Patient appears asymptomatic.  Hypokalemia: Repleted  PAF: New diagnosis-rate controlled as of any rate control medications-given worsening thrombocytopenia-holding off on anticoagulation at this point.  Obtain echo-follow CBC.  Recent TSH in August stable.  Pancytopenia: Likely sequelae of chemotherapy-on Granix-oncology following.  Follow CBC.  Distal esophageal adenocarcinoma: S/p palliative radiation completed on 7/20-last cycle of chemotherapy on 10/19.  Oncology following.  DM-2 (A1c 5.9): CBG stable with SSI-continue to hold Ozempic.  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  Depression/anxiety: Relatively stable-continue Lexapro and as needed Xanax.  Tobacco: Continue transdermal nicotine-counseled regarding importance of quitting.  BMI Estimated body mass index is 27.44 kg/m as calculated from the following:   Height as of this encounter: 5\' 6"  (1.676 m).   Weight as of this encounter: 77.1 kg.    Procedures:  Consults: GI, Onc DVT Prophylaxis:  Code Status:Full code  Family Communication: Spouse-Marilyn-[phone]-left voice mail on 11/1  Time spent: 6 minutes-Greater than 50% of this time was spent in counseling, explanation of diagnosis, planning of further management, and coordination of care.  Disposition Plan: Status is: Inpatient  Remains inpatient appropriate because: Intractable nausea/vomiting-unable to tolerate oral intake.  Continues to have hyponatremia.   Diet: Diet Order             DIET DYS 3 Room service appropriate? Yes; Fluid  consistency: Thin; Fluid restriction: 1500 mL Fluid  Diet effective now                     Antimicrobial agents: Anti-infectives (From admission, onward)    None       MEDICATIONS: Scheduled Meds:  acidophilus  1 capsule Oral Daily   enoxaparin (LOVENOX) injection  40 mg Subcutaneous Q24H   escitalopram  10 mg Oral Daily   gabapentin  300 mg Oral TID   insulin aspart  0-9 Units Subcutaneous TID WC   nicotine  21 mg Transdermal Daily   pantoprazole  40 mg Oral BID   potassium chloride  40 mEq Oral BID   primidone  50 mg Oral BID   rOPINIRole  0.5 mg Oral QHS   tamsulosin  0.4 mg Oral Daily   Tbo-filgastrim (GRANIX) SQ  480 mcg Subcutaneous q1800   Continuous Infusions:  0.9 % NaCl with KCl 40 mEq / L 100 mL/hr at 07/17/21 2155   PRN Meds:.acetaminophen, albuterol, ALPRAZolam, morphine injection, ondansetron **OR** ondansetron (ZOFRAN) IV, oxyCODONE-acetaminophen **AND** oxyCODONE, prochlorperazine, tiZANidine   I have personally reviewed following labs and imaging studies  LABORATORY DATA: CBC: Recent Labs  Lab 07/15/21 1048 07/16/21 0159 07/17/21 0225 07/18/21 0229  WBC 4.4 2.4* 1.2* 1.9*  NEUTROABS 3.7  --  0.6* 0.9*  HGB 14.2 12.1* 10.7* 11.5*  HCT 41.8 35.7* 31.6* 33.0*  MCV 95.9 95.7 94.3 92.7  PLT 114* 93* 81* 78*    Basic Metabolic Panel: Recent Labs  Lab 07/15/21 1048 07/15/21 1711 07/16/21 0159 07/16/21 0516 07/17/21 0225 07/18/21 0229  NA 129* 132* 129*  --  125* 127*  K 3.6 3.6 2.9*  --  2.9* 3.6  CL 90* 95* 96*  --  95* 100  CO2 22 24 22   --  23 20*  GLUCOSE 286* 214* 206*  --  118* 100*  BUN 21 23 28*  --  18 14  CREATININE 1.57* 1.44* 1.32*  --  0.94 0.76  CALCIUM 8.8* 8.2* 8.0*  --  7.7* 7.5*  MG  --   --   --  1.7 2.0 1.9    GFR: Estimated Creatinine Clearance: 76.8 mL/min (by C-G formula based on SCr of 0.76 mg/dL).  Liver Function Tests: Recent Labs  Lab 07/15/21 1048 07/16/21 0159 07/17/21 0225 07/18/21 0229   AST 62* 44* 30 25  ALT 42 37 26 22  ALKPHOS 69 57 49 47  BILITOT 1.2 0.7 0.6 0.6  PROT 6.3* 5.2* 4.4* 4.4*  ALBUMIN 2.9* 2.3* 1.9* 1.9*   Recent Labs  Lab 07/15/21 2140  LIPASE 16   No results for input(s): AMMONIA in the last 168 hours.  Coagulation Profile: No results for input(s): INR, PROTIME in the last 168 hours.  Cardiac Enzymes: No results for input(s): CKTOTAL, CKMB, CKMBINDEX, TROPONINI in the last 168 hours.  BNP (last 3 results) No results for input(s): PROBNP in the last 8760 hours.  Lipid Profile: No results for input(s): CHOL, HDL, LDLCALC, TRIG, CHOLHDL, LDLDIRECT in the last 72 hours.  Thyroid Function Tests: No results for input(s): TSH, T4TOTAL, FREET4, T3FREE, THYROIDAB in the last 72 hours.  Anemia Panel: No results for input(s): VITAMINB12,  FOLATE, FERRITIN, TIBC, IRON, RETICCTPCT in the last 72 hours.  Urine analysis:    Component Value Date/Time   COLORURINE YELLOW 01/23/2010 [place] 01/23/2010 0934   LABSPEC 1.027 01/23/2010 0934   PHURINE 6.0 01/23/2010 0934   GLUCOSEU 250 (A) 01/23/2010 0934   HGBUR NEGATIVE 01/23/2010 0934   BILIRUBINUR NEGATIVE 01/23/2010 [place] 01/23/2010 0934   PROTEINUR 100 (A) 01/23/2010 0934   UROBILINOGEN 0.2 01/23/2010 0934   NITRITE NEGATIVE 01/23/2010 0934   LEUKOCYTESUR NEGATIVE 01/23/2010 0934    Sepsis Labs: Lactic Acid, Venous No results found for: LATICACIDVEN  MICROBIOLOGY: Recent Results (from the past 240 hour(s))  Resp Panel by RT-PCR (Flu A&B, Covid) Nasopharyngeal Swab     Status: None   Collection Time: 07/15/21 11:16 AM   Specimen: Nasopharyngeal Swab; Nasopharyngeal(NP) swabs in vial transport medium  Result Value Ref Range Status   SARS Coronavirus 2 by RT PCR NEGATIVE NEGATIVE Final    Comment: (NOTE) SARS-CoV-2 target nucleic acids are NOT DETECTED.  The SARS-CoV-2 RNA is generally detectable in upper respiratory specimens during the acute  phase of infection. The lowest concentration of SARS-CoV-2 viral copies this assay can detect is 138 copies/mL. A negative result does not preclude SARS-Cov-2 infection and should not be used as the sole basis for treatment or other patient management decisions. A negative result may occur with  improper specimen collection/handling, submission of specimen other than nasopharyngeal swab, presence of viral mutation(s) within the areas targeted by this assay, and inadequate number of viral copies(<138 copies/mL). A negative result must be combined with clinical observations, patient history, and epidemiological information. The expected result is Negative.  Fact Sheet for Patients:  EntrepreneurPulse.com.au  Fact Sheet for Healthcare Providers:  IncredibleEmployment.be  This test is no t yet approved or cleared by the [place] FDA and  has been authorized for detection and/or diagnosis of SARS-CoV-2 by FDA under an Emergency Use Authorization (EUA). This EUA will remain  in effect (meaning this test can be used) for the duration of the COVID-19 declaration under Section 564(b)(1) of the Act, 21 U.S.C.section 360bbb-3(b)(1), unless the authorization is terminated  or revoked sooner.       Influenza A by PCR NEGATIVE NEGATIVE Final   Influenza B by PCR NEGATIVE NEGATIVE Final    Comment: (NOTE) The Xpert Xpress SARS-CoV-2/FLU/RSV plus assay is intended as an aid in the diagnosis of influenza from Nasopharyngeal swab specimens and should not be used as a sole basis for treatment. Nasal washings and aspirates are unacceptable for Xpert Xpress SARS-CoV-2/FLU/RSV testing.  Fact Sheet for Patients: EntrepreneurPulse.com.au  Fact Sheet for Healthcare Providers: IncredibleEmployment.be  This test is not yet approved or cleared by the [place] FDA and has been authorized for detection and/or diagnosis of  SARS-CoV-2 by FDA under an Emergency Use Authorization (EUA). This EUA will remain in effect (meaning this test can be used) for the duration of the COVID-19 declaration under Section 564(b)(1) of the Act, 21 U.S.C. section 360bbb-3(b)(1), unless the authorization is terminated or revoked.  Performed at [facility], [place] [address], [place],  [address]     RADIOLOGY STUDIES/RESULTS: DG ESOPHAGUS W SINGLE CM (SOL OR THIN BA)  Result Date: 07/17/2021 CLINICAL DATA:  History of esophageal cancer with nausea and vomiting. EXAM: ESOPHAGUS/BARIUM SWALLOW TECHNIQUE: Single contrast examination was performed using thin liquid barium. This exam was performed by Soyla Dryer, and was supervised and interpreted by Abigail Miyamoto. FLUOROSCOPY TIME:  Radiation Exposure Index (as provided by the fluoroscopic device): Not applicable. If the device does not provide the exposure index: Fluoroscopy Time:  1 minute and 36 seconds Number of Acquired Images:  None COMPARISON:  Chest CT 07/13/2021 FINDINGS: Focused exam was performed with the patient supine and in various obliquities. Focused single-contrast exam performed. Full column evaluation of the esophagus demonstrates no persistent narrowing or stricture. Spontaneous gastroesophageal reflux is identified to the level of the clavicles. A 13 mm barium tablet passes promptly. IMPRESSION: Single-contrast, focused exam performed as detailed above. No esophageal stricture. Spontaneous gastroesophageal reflux. Electronically Signed   By: Abigail Miyamoto M.D.   On: 07/17/2021 09:48     LOS: 2 days   Oren Binet, MD  [facility]    To contact the attending provider between 7A-7P or the covering provider during after hours 7P-7A, please log into the web site [email or web address] and access using universal [place] password for that web site. If you do not have the password, please call the hospital operator.  07/18/2021, 1:20 PM

## 2021-07-18 NOTE — Progress Notes (Signed)
[facility] Gastroenterology Progress Note  Justin Trujillo. 75 y.o. [birth year]  CC: Nausea and vomiting   Subjective: Patient seen and examined at bedside.  He is feeling better.  Nausea has improved.  No further vomiting.  Was found to have A. fib this morning  ROS : Febrile, negative for chest pain   Objective: Vital signs in last 24 hours: Vitals:   07/18/21 0444 07/18/21 0700  BP: 127/76 127/79  Pulse: 99 (!) 102  Resp: 16 18  Temp: 97.8 F (36.6 C) 98.5 F (36.9 C)  SpO2: 96% 96%    Physical Exam:  General:  Alert, cooperative, no distress, appears stated age  Head:  Normocephalic, without obvious abnormality, atraumatic  Eyes:  , EOM's intact,   Lungs:   No visible respiratory distress  Heart:  Regular rate and rhythm,  Abdomen:   Soft, non-tender, distended, bowel sounds present  Neuro Alert and oriented x3  Psych Mood and affect normal    Lab Results: Recent Labs    07/17/21 0225 07/18/21 0229  NA 125* 127*  K 2.9* 3.6  CL 95* 100  CO2 23 20*  GLUCOSE 118* 100*  BUN 18 14  CREATININE 0.94 0.76  CALCIUM 7.7* 7.5*  MG 2.0 1.9   Recent Labs    07/17/21 0225 07/18/21 0229  AST 30 25  ALT 26 22  ALKPHOS 49 47  BILITOT 0.6 0.6  PROT 4.4* 4.4*  ALBUMIN 1.9* 1.9*   Recent Labs    07/17/21 0225 07/18/21 0229  WBC 1.2* 1.9*  NEUTROABS 0.6* 0.9*  HGB 10.7* 11.5*  HCT 31.6* 33.0*  MCV 94.3 92.7  PLT 81* 78*   No results for input(s): LABPROT, INR in the last 72 hours.    Assessment/Plan: -Distal esophageal adenocarcinoma.  S/p radiation and chemotherapy.  Presented with nausea vomiting and diarrhea.  Symptoms improving. CT negative for acute changes. -A. fib.  Based on EKG this morning.   Recommendations ---------------------------- -Barium swallow negative for stricture.  13 mm tablet passed promptly.  Patient is feeling better.  No further vomiting -No further inpatient GI work-up planned.  Continue supportive care.  GI will sign off.   Call us back if needed   Otis Brace MD, [place] 07/18/2021, 1:35 PM  Contact #  [phone]

## 2021-07-18 NOTE — Plan of Care (Signed)

## 2021-07-18 NOTE — Plan of Care (Signed)
  Problem: Clinical Measurements: Goal: Will remain free from infection Outcome: Progressing   Problem: Skin Integrity: Goal: Risk for impaired skin integrity will decrease Outcome: Progressing   

## 2021-07-18 NOTE — Progress Notes (Signed)
Pt complained of chest pain, horizontally radiating. Pt denies Short of breath and is asymptomatic. All vitals are in normal limits and blood sugar is 101.

## 2021-07-18 NOTE — Telephone Encounter (Signed)
Patient/wife called Dr. Libby Maw office to inform Dr. Lorenso Courier that he was in hospital and might not be able to come to appointments and infusion tomorrow.Ms. Grupe reports he is very weak, has not been out of bed since admission and is now having problems with heart rhythm. She does not think he will be discharged tomorrow. Dr. Lorenso Courier informed. Per Dr. Lorenso Courier - informed patient/wife that Dr. Lorenso Courier will see him tomorrow in hospital if patient has not been discharged and is unable to come to [facility] for appt. Per MD, if patient cannot come to [facility] for treatment, he will skip treatment tomorrow and resume on 11/16 with next scheduled infusion.  Ms. Kottke and patient verbalized understanding of information.

## 2021-07-18 NOTE — Progress Notes (Signed)
12 lead of patient was in A fibrillation wth PVC

## 2021-07-19 ENCOUNTER — Inpatient Hospital Stay: Payer: PPO

## 2021-07-19 ENCOUNTER — Inpatient Hospital Stay (HOSPITAL_COMMUNITY): Payer: PPO

## 2021-07-19 ENCOUNTER — Inpatient Hospital Stay: Payer: PPO | Admitting: Hematology and Oncology

## 2021-07-19 DIAGNOSIS — I2510 Atherosclerotic heart disease of native coronary artery without angina pectoris: Secondary | ICD-10-CM | POA: Diagnosis not present

## 2021-07-19 DIAGNOSIS — C155 Malignant neoplasm of lower third of esophagus: Secondary | ICD-10-CM | POA: Diagnosis not present

## 2021-07-19 DIAGNOSIS — I480 Paroxysmal atrial fibrillation: Secondary | ICD-10-CM | POA: Diagnosis not present

## 2021-07-19 DIAGNOSIS — D696 Thrombocytopenia, unspecified: Secondary | ICD-10-CM

## 2021-07-19 DIAGNOSIS — G4733 Obstructive sleep apnea (adult) (pediatric): Secondary | ICD-10-CM

## 2021-07-19 DIAGNOSIS — E860 Dehydration: Secondary | ICD-10-CM | POA: Diagnosis not present

## 2021-07-19 DIAGNOSIS — I501 Left ventricular failure: Secondary | ICD-10-CM

## 2021-07-19 DIAGNOSIS — E785 Hyperlipidemia, unspecified: Secondary | ICD-10-CM | POA: Diagnosis not present

## 2021-07-19 DIAGNOSIS — R112 Nausea with vomiting, unspecified: Secondary | ICD-10-CM | POA: Diagnosis not present

## 2021-07-19 DIAGNOSIS — I10 Essential (primary) hypertension: Secondary | ICD-10-CM | POA: Diagnosis not present

## 2021-07-19 DIAGNOSIS — E1169 Type 2 diabetes mellitus with other specified complication: Secondary | ICD-10-CM | POA: Diagnosis not present

## 2021-07-19 LAB — BASIC METABOLIC PANEL
Anion gap: 5 (ref 5–15)
BUN: 9 mg/dL (ref 8–23)
CO2: 19 mmol/L — ABNORMAL LOW (ref 22–32)
Calcium: 7.7 mg/dL — ABNORMAL LOW (ref 8.9–10.3)
Chloride: 107 mmol/L (ref 98–111)
Creatinine, Ser: 0.8 mg/dL (ref 0.61–1.24)
GFR, Estimated: >60 mL/min (ref >60)
Glucose, Bld: 102 mg/dL — ABNORMAL HIGH (ref 70–99)
Potassium: 4.3 mmol/L (ref 3.5–5.1)
Sodium: 131 mmol/L — ABNORMAL LOW (ref 135–145)

## 2021-07-19 LAB — GLUCOSE, CAPILLARY
Glucose-Capillary: 79 mg/dL (ref 70–99)
Glucose-Capillary: 98 mg/dL (ref 70–99)
Glucose-Capillary: 95 mg/dL (ref 70–99)
Glucose-Capillary: 85 mg/dL (ref 70–99)

## 2021-07-19 LAB — ECHOCARDIOGRAM LIMITED
Height: 66 in
Weight: 2720 oz

## 2021-07-19 LAB — CBC
HCT: 33.5 % — ABNORMAL LOW (ref 39.0–52.0)
Hemoglobin: 11.3 g/dL — ABNORMAL LOW (ref 13.0–17.0)
MCH: 32.1 pg (ref 26.0–34.0)
MCHC: 33.7 g/dL (ref 30.0–36.0)
MCV: 95.2 fL (ref 80.0–100.0)
Platelets: 81 10*3/uL — ABNORMAL LOW (ref 150–400)
RBC: 3.52 MIL/uL — ABNORMAL LOW (ref 4.22–5.81)
RDW: 20.4 % — ABNORMAL HIGH (ref 11.5–15.5)
WBC: 8.6 10*3/uL (ref 4.0–10.5)
nRBC: 0.5 % — ABNORMAL HIGH (ref 0.0–0.2)

## 2021-07-19 LAB — HEPARIN LEVEL (UNFRACTIONATED): Heparin Unfractionated: <0.1 IU/mL — ABNORMAL LOW (ref 0.30–0.70)

## 2021-07-19 LAB — MAGNESIUM: Magnesium: 1.7 mg/dL (ref 1.7–2.4)

## 2021-07-19 MED ORDER — HEPARIN (PORCINE) 25000 UT/250ML-% IV SOLN
1200.0000 [IU]/h | INTRAVENOUS | Status: DC
  Administered 2021-07-19: 1000 [IU]/h via INTRAVENOUS
  Filled 2021-07-19: qty 250

## 2021-07-19 MED ORDER — AMIODARONE HCL IN DEXTROSE 360-4.14 MG/200ML-% IV SOLN
60.0000 mg/h | INTRAVENOUS | Status: DC
  Administered 2021-07-19: 60 mg/h via INTRAVENOUS
  Filled 2021-07-19: qty 200

## 2021-07-19 MED ORDER — MAGNESIUM SULFATE 2 GM/50ML IV SOLN
2.0000 g | Freq: Once | INTRAVENOUS | Status: AC
  Administered 2021-07-19: 2 g via INTRAVENOUS
  Filled 2021-07-19: qty 50

## 2021-07-19 MED ORDER — AMIODARONE LOAD VIA INFUSION
150.0000 mg | Freq: Once | INTRAVENOUS | Status: AC
  Administered 2021-07-19: 150 mg via INTRAVENOUS
  Filled 2021-07-19: qty 83.34

## 2021-07-19 MED ORDER — AMIODARONE HCL IN DEXTROSE 360-4.14 MG/200ML-% IV SOLN
30.0000 mg/h | INTRAVENOUS | Status: DC
  Administered 2021-07-19: 60 mg/h via INTRAVENOUS
  Administered 2021-07-20 – 2021-07-21 (×3): 30 mg/h via INTRAVENOUS
  Filled 2021-07-19 (×7): qty 200

## 2021-07-19 MED ORDER — PERFLUTREN LIPID MICROSPHERE
1.0000 mL | INTRAVENOUS | Status: AC | PRN
  Administered 2021-07-19: 1 mL via INTRAVENOUS
  Filled 2021-07-19: qty 10

## 2021-07-19 NOTE — Progress Notes (Signed)
PROGRESS NOTE        PATIENT DETAILS Name: Justin Trujillo. Age: 75 y.o. Sex: male Date of Birth: 09-07-1946 Admit Date: 07/15/2021 Admitting Physician Orma Flaming, MD HCW:CBJSE, Hal Hope, MD  Brief Narrative: Patient is a 75 y.o. male with history of metastatic esophageal cancer-s/p palliative radiation-on chemotherapy-presented to the hospital with recurrent nausea/vomiting.  See below for further details.   Subjective: Feels much better today-no nausea, vomiting or diarrhea.  Continues to have occasional epigastric discomfort.  Objective: Vitals: Blood pressure 122/73, pulse 99, temperature 97.7 F (36.5 C), temperature source Oral, resp. rate 17, height 5\' 6"  (1.676 m), weight 77.1 kg, SpO2 96 %.   Exam: Gen Exam:Alert awake-not in any distress HEENT:atraumatic, normocephalic Chest: B/L clear to auscultation anteriorly CVS:S1S2 regular Abdomen:soft non tender, non distended Extremities:no edema Neurology: Non focal Skin: no rash   Pertinent Labs/Radiology: Recent Labs  Lab 07/15/21 1048 07/15/21 1711 07/16/21 0159 07/17/21 0225 07/18/21 0229 07/19/21 0318  WBC 4.4  --  2.4* 1.2* 1.9* 8.6  HGB 14.2  --  12.1* 10.7* 11.5* 11.3*  PLT 114*  --  93* 81* 78* 81*  NA 129*   < > 129* 125* 127* 131*  K 3.6   < > 2.9* 2.9* 3.6 4.3  CREATININE 1.57*   < > 1.32* 0.94 0.76 0.80  AST 62*  --  44* 30 25  --   ALT 42  --  37 26 22  --   ALKPHOS 69  --  57 49 47  --   BILITOT 1.2  --  0.7 0.6 0.6  --    < > = values in this interval not displayed.     10/27>> CT chest/abdomen/pelvis: Soft tissue thickening in the distal esophagus-no suspicious findings of metastatic disease. 10/27>> CT soft tissue neck: No new adenopathy-no acute abnormalities.   10/31>> barium esophagogram.  No esophageal stricture. 11/1>> Echo: EF appears mild to moderately depressed  Assessment/Plan: Intractable nausea/vomiting: Due to underlying malignancy/recent  chemotherapy.  Barium esophagogram negative for strictures.  CT abdomen negative for SBO.  Improved after starting scheduled Zofran.  Continue as needed Compazine.  C  Epigastric pain: Due to reflux/nausea/vomiting/distal esophageal adenocarcinoma-continues to have intermittent epigastric pain/dyspepsia like symptoms-continue PPI and Carafate.  May need to add H2 blocker if symptoms continue.    Hyponatremia: Due to dehydration-improved with IVF-and better control of nausea/vomiting.  Stop IVF today and reassess tomorrow.  Hypokalemia: Due to GI loss-repleted-has normalized.  Recheck electrolytes tomorrow.  New onset PAF: New diagnosis-rate controlled-difficult situation regarding initiation of anticoagulation given thrombocytopenia/cancer diagnoses.  Echo shows suppressed EF-repeating limited echo today.  We will get cardiology opinion regarding anticoagulation/rate/rhythm control.  Recent TSH in August was stable.  Pancytopenia: Likely sequelae of chemotherapy-on Granix-oncology following.  Follow CBC.  Distal esophageal adenocarcinoma: S/p palliative radiation completed on 7/20-last cycle of chemotherapy on 10/19.  Oncology following.  DM-2 (A1c 5.9): CBG stable with SSI-continue to hold Ozempic.  Recent Labs    07/18/21 2001 07/19/21 0645 07/19/21 1128  GLUCAP 99 79 98     BPH: Continue Flomax  Essential tremor: Continue primidone/Requip  Peripheral neuropathy: Likely due to diabetes-continue Neurontin.  Depression/anxiety: Relatively stable-continue Lexapro and as needed Xanax.  Tobacco: Continue transdermal nicotine-counseled regarding importance of quitting.  BMI Estimated body mass index is 27.44 kg/m as calculated from the following:  Height as of this encounter: 5\' 6"  (1.676 m).   Weight as of this encounter: 77.1 kg.    Procedures:  Consults: GI, Onc DVT Prophylaxis:  Code Status:Full code  Family Communication: Spouse-[phone]-left voice mail on  11/1  Time spent: 56 minutes-Greater than 50% of this time was spent in counseling, explanation of diagnosis, planning of further management, and coordination of care.   Disposition Plan: Status is: Inpatient  Remains inpatient appropriate because: Intractable nausea/vomiting-unable to tolerate oral intake.  Continues to have hyponatremia.   Diet: Diet Order             DIET DYS 3 Room service appropriate? Yes; Fluid consistency: Thin; Fluid restriction: 1500 mL Fluid  Diet effective now                     Antimicrobial agents: Anti-infectives (From admission, onward)    None       MEDICATIONS: Scheduled Meds:  acidophilus  1 capsule Oral Daily   amiodarone  150 mg Intravenous Once   enoxaparin (LOVENOX) injection  40 mg Subcutaneous Q24H   escitalopram  10 mg Oral Daily   gabapentin  300 mg Oral TID   insulin aspart  0-9 Units Subcutaneous TID WC   nicotine  21 mg Transdermal Daily   ondansetron (ZOFRAN) IV  4 mg Intravenous Q8H   pantoprazole  40 mg Oral BID   primidone  50 mg Oral BID   rOPINIRole  0.5 mg Oral QHS   sucralfate  1 g Oral TID WC & HS   tamsulosin  0.4 mg Oral Daily   Tbo-filgastrim (GRANIX) SQ  480 mcg Subcutaneous q1800   Continuous Infusions:  amiodarone     Followed by   amiodarone     PRN Meds:.acetaminophen, albuterol, ALPRAZolam, loperamide, morphine injection, oxyCODONE-acetaminophen **AND** oxyCODONE, prochlorperazine, tiZANidine   I have personally reviewed following labs and imaging studies  LABORATORY DATA: CBC: Recent Labs  Lab 07/15/21 1048 07/16/21 0159 07/17/21 0225 07/18/21 0229 07/19/21 0318  WBC 4.4 2.4* 1.2* 1.9* 8.6  NEUTROABS 3.7  --  0.6* 0.9*  --   HGB 14.2 12.1* 10.7* 11.5* 11.3*  HCT 41.8 35.7* 31.6* 33.0* 33.5*  MCV 95.9 95.7 94.3 92.7 95.2  PLT 114* 93* 81* 78* 81*     Basic Metabolic Panel: Recent Labs  Lab 07/15/21 1711 07/16/21 0159 07/16/21 0516 07/17/21 0225 07/18/21 0229  07/19/21 0318  NA 132* 129*  --  125* 127* 131*  K 3.6 2.9*  --  2.9* 3.6 4.3  CL 95* 96*  --  95* 100 107  CO2 24 22  --  23 20* 19*  GLUCOSE 214* 206*  --  118* 100* 102*  BUN 23 28*  --  18 14 9   CREATININE 1.44* 1.32*  --  0.94 0.76 0.80  CALCIUM 8.2* 8.0*  --  7.7* 7.5* 7.7*  MG  --   --  1.7 2.0 1.9 1.7     GFR: Estimated Creatinine Clearance: 76.8 mL/min (by C-G formula based on SCr of 0.8 mg/dL).  Liver Function Tests: Recent Labs  Lab 07/15/21 1048 07/16/21 0159 07/17/21 0225 07/18/21 0229  AST 62* 44* 30 25  ALT 42 37 26 22  ALKPHOS 69 57 49 47  BILITOT 1.2 0.7 0.6 0.6  PROT 6.3* 5.2* 4.4* 4.4*  ALBUMIN 2.9* 2.3* 1.9* 1.9*    Recent Labs  Lab 07/15/21 2140  LIPASE 16    No results for input(s): AMMONIA in  the last 168 hours.  Coagulation Profile: No results for input(s): INR, PROTIME in the last 168 hours.  Cardiac Enzymes: No results for input(s): CKTOTAL, CKMB, CKMBINDEX, TROPONINI in the last 168 hours.  BNP (last 3 results) No results for input(s): PROBNP in the last 8760 hours.  Lipid Profile: No results for input(s): CHOL, HDL, LDLCALC, TRIG, CHOLHDL, LDLDIRECT in the last 72 hours.  Thyroid Function Tests: No results for input(s): TSH, T4TOTAL, FREET4, T3FREE, THYROIDAB in the last 72 hours.  Anemia Panel: No results for input(s): VITAMINB12, FOLATE, FERRITIN, TIBC, IRON, RETICCTPCT in the last 72 hours.  Urine analysis:    Component Value Date/Time   COLORURINE YELLOW 01/23/2010 [place] 01/23/2010 0934   LABSPEC 1.027 01/23/2010 0934   PHURINE 6.0 01/23/2010 0934   GLUCOSEU 250 (A) 01/23/2010 0934   HGBUR NEGATIVE 01/23/2010 0934   BILIRUBINUR NEGATIVE 01/23/2010 [place] 01/23/2010 0934   PROTEINUR 100 (A) 01/23/2010 0934   UROBILINOGEN 0.2 01/23/2010 0934   NITRITE NEGATIVE 01/23/2010 0934   LEUKOCYTESUR NEGATIVE 01/23/2010 0934    Sepsis Labs: Lactic Acid, Venous No results found for:  LATICACIDVEN  MICROBIOLOGY: Recent Results (from the past 240 hour(s))  Resp Panel by RT-PCR (Flu A&B, Covid) Nasopharyngeal Swab     Status: None   Collection Time: 07/15/21 11:16 AM   Specimen: Nasopharyngeal Swab; Nasopharyngeal(NP) swabs in vial transport medium  Result Value Ref Range Status   SARS Coronavirus 2 by RT PCR NEGATIVE NEGATIVE Final    Comment: (NOTE) SARS-CoV-2 target nucleic acids are NOT DETECTED.  The SARS-CoV-2 RNA is generally detectable in upper respiratory specimens during the acute phase of infection. The lowest concentration of SARS-CoV-2 viral copies this assay can detect is 138 copies/mL. A negative result does not preclude SARS-Cov-2 infection and should not be used as the sole basis for treatment or other patient management decisions. A negative result may occur with  improper specimen collection/handling, submission of specimen other than nasopharyngeal swab, presence of viral mutation(s) within the areas targeted by this assay, and inadequate number of viral copies(<138 copies/mL). A negative result must be combined with clinical observations, patient history, and epidemiological information. The expected result is Negative.  Fact Sheet for Patients:  EntrepreneurPulse.com.au  Fact Sheet for Healthcare Providers:  IncredibleEmployment.be  This test is no t yet approved or cleared by the [place] FDA and  has been authorized for detection and/or diagnosis of SARS-CoV-2 by FDA under an Emergency Use Authorization (EUA). This EUA will remain  in effect (meaning this test can be used) for the duration of the COVID-19 declaration under Section 564(b)(1) of the Act, 21 U.S.C.section 360bbb-3(b)(1), unless the authorization is terminated  or revoked sooner.       Influenza A by PCR NEGATIVE NEGATIVE Final   Influenza B by PCR NEGATIVE NEGATIVE Final    Comment: (NOTE) The Xpert Xpress SARS-CoV-2/FLU/RSV  plus assay is intended as an aid in the diagnosis of influenza from Nasopharyngeal swab specimens and should not be used as a sole basis for treatment. Nasal washings and aspirates are unacceptable for Xpert Xpress SARS-CoV-2/FLU/RSV testing.  Fact Sheet for Patients: EntrepreneurPulse.com.au  Fact Sheet for Healthcare Providers: IncredibleEmployment.be  This test is not yet approved or cleared by the [place] FDA and has been authorized for detection and/or diagnosis of SARS-CoV-2 by FDA under an Emergency Use Authorization (EUA). This EUA will remain in effect (meaning this test can be used) for the duration of the  COVID-19 declaration under Section 564(b)(1) of the Act, 21 U.S.C. section 360bbb-3(b)(1), unless the authorization is terminated or revoked.  Performed at [facility], [facility] [address] [place], [place] [address]     RADIOLOGY STUDIES/RESULTS: ECHOCARDIOGRAM COMPLETE  Result Date: 07/18/2021    ECHOCARDIOGRAM REPORT   Patient Name:   Justin Trujillo. Date of Exam: 07/18/2021 Medical Rec #:  [id number]          Height:       66.0 in Accession #:    [id number]         Weight:       170.0 lb Date of Birth:  May 14, 1946          BSA:          1.866 m Patient Age:    16 years           BP:           127/79 mmHg Patient Gender: M                  HR:           114 bpm. Exam Location:  Inpatient Procedure: 2D Echo, Cardiac Doppler and Color Doppler Indications:    I48.1 Persistent atrial fibrillation  History:        Patient has no prior history of Echocardiogram examinations.                 CAD, COPD, Signs/Symptoms:Dyspnea and Chest Pain; Risk                 Factors:Diabetes and Dyslipidemia.  Sonographer:    Glo Herring Referring Phys: [place]  1. LVEF appears at least mild to moderately depressed with hypokinesis of the distal half of the LV WOuld recomm Limited echo with Definity to confirm LVEF and  wall motion. . The left ventricle has no regional wall motion abnormalities. Left ventricular  diastolic parameters are indeterminate.  2. Right ventricular systolic function is low normal. The right ventricular size is normal.  3. Trivial mitral valve regurgitation. Moderate mitral annular calcification.  4. Aortic valve regurgitation is not visualized. Mild to moderate aortic valve sclerosis/calcification is present, without any evidence of aortic stenosis.  5. The inferior vena cava is normal in size with greater than 50% respiratory variability, suggesting right atrial pressure of 3 mmHg. FINDINGS  Left Ventricle: LVEF appears at least mild to moderately depressed with hypokinesis of the distal half of the LV WOuld recomm Limited echo with Definity to confirm LVEF and wall motion. The left ventricle has no regional wall motion abnormalities. The left ventricular internal cavity size was normal in size. There is no left ventricular hypertrophy. Left ventricular diastolic parameters are indeterminate. Right Ventricle: The right ventricular size is normal. Right vetricular wall thickness was not assessed. Right ventricular systolic function is low normal. Left Atrium: Left atrial size was normal in size. Right Atrium: Right atrial size was normal in size. Pericardium: There is no evidence of pericardial effusion. Mitral Valve: There is mild thickening of the mitral valve leaflet(s). Moderate mitral annular calcification. Trivial mitral valve regurgitation. Tricuspid Valve: The tricuspid valve is normal in structure. Tricuspid valve regurgitation is trivial. Aortic Valve: Aortic valve regurgitation is not visualized. Mild to moderate aortic valve sclerosis/calcification is present, without any evidence of aortic stenosis. Aortic valve mean gradient measures 6.7 mmHg. Aortic valve peak gradient measures 16.2 mmHg. Pulmonic Valve: The pulmonic valve was not well  visualized. Pulmonic valve regurgitation is not  visualized. No evidence of pulmonic stenosis. Aorta: The aortic root is normal in size and structure. Venous: The inferior vena cava is normal in size with greater than 50% respiratory variability, suggesting right atrial pressure of 3 mmHg. IAS/Shunts: No atrial level shunt detected by color flow Doppler.  LEFT VENTRICLE PLAX 2D LVIDd:         3.50 cm LVIDs:         2.50 cm LV PW:         1.10 cm LV IVS:        1.00 cm  RIGHT VENTRICLE          IVC RV Basal diam:  3.90 cm  IVC diam: 1.80 cm LEFT ATRIUM           Index        RIGHT ATRIUM           Index LA diam:      3.80 cm 2.04 cm/m   RA Area:     17.60 cm LA Vol (A4C): 53.1 ml 28.45 ml/m  RA Volume:   41.80 ml  22.40 ml/m  AORTIC VALVE                    PULMONIC VALVE AV Vmax:           201.33 cm/s  PV Vmax:       1.32 m/s AV Vmean:          117.667 cm/s PV Peak grad:  7.0 mmHg AV VTI:            0.255 m AV Peak Grad:      16.2 mmHg AV Mean Grad:      6.7 mmHg LVOT Vmax:         90.62 cm/s LVOT Vmean:        58.950 cm/s LVOT VTI:          0.128 m LVOT/AV VTI ratio: 0.50  AORTA Ao Root diam: 3.60 cm  SHUNTS Systemic VTI: 0.13 m Dorris Carnes MD Electronically signed by Dorris Carnes MD Signature Date/Time: 07/18/2021/7:57:11 PM    Final      LOS: 3 days   Oren Binet, MD  [facility]    To contact the attending provider between 7A-7P or the covering provider during after hours 7P-7A, please log into the web site [email or web address] and access using universal [place] password for that web site. If you do not have the password, please call the hospital operator.  07/19/2021, 12:16 PM

## 2021-07-19 NOTE — Care Management Important Message (Signed)
Important Message  Patient Details  Name: Justin Trujillo. MRN: [id number] Date of Birth: [birth year]   Medicare Important Message Given:  Yes     Heleena Miceli 07/19/2021, 2:36 PM

## 2021-07-19 NOTE — Consult Note (Addendum)
The patient has been seen in conjunction with Reino Bellis, NP-C. All aspects of care have been considered and discussed. The patient has been personally interviewed, examined, and all clinical data has been reviewed.  Paroxysmal atrial fibrillation with moderate rate control occurring in setting of nausea and vomiting.  Background history as outlined below including recent diagnosis of esophageal cancer currently undergoing chemotherapy. The CHADS VASC is high (4-5), which places him at increased risk for embolic CVA. Recommend initiation of amiodarone therapy with long-term goal being conversion and maintenance of sinus rhythm.  Start anticoagulation therapy to continue for at least 6 to 8 weeks, watching for bleeding.  If he does not have spontaneous conversion on amiodarone, he will need to have electrical cardioversion following 3 weeks of continuous anticoagulation. Discussed with the patient and family the higher risk of bleeding.  I do not believe he is ready for discharge today, as he will need to get up and move about to determine if he has rate control before discharge.  Cardiology Consultation:   Patient ID: Justin Trujillo. MRN: [id number]; DOB: 08-11-1946  Admit date: 07/15/2021 Date of Consult: 07/19/2021  PCP:  Carol Ada, MD   [facility] Providers Cardiologist:  Cristopher Peru, MD     Patient Profile:   Justin Prevette. is a 75 y.o. 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 Sloan Leiter.  History of Present Illness:   Justin Trujillo is a 75 year old male with past medical history noted above.  He has been followed by Dr. Lovena Le as an outpatient.  He was previously followed by Dr. Verl Blalock and then transition to Dr. Lovena Le many  years back.  He underwent cardiac catheterization in 2007 which showed nonobstructive CAD.  Echocardiogram 08/2010 showed EF of 55 to 60% with no regional wall motion abnormality, grade 1 diastolic dysfunction, mild biatrial enlargement.  He was last seen in the office on 02/2018 for follow-up.  Patient was complaining of chest tightness which mostly occurred with emotional stress.  He had also developed worsening pain in his knee and was pending knee surgery.  He was referred for [facility] 03/2018 which showed small size, mild fixed apical perfusion defect likely attenuation artifact.  He was cleared to undergo surgery.  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 Lorenso Courier.  Underwent EGD 02/15/2021 and found to have esophageal mass with biopsy confirming carcinoma.  Underwent PET scan 02/22/2021 showing FDG avid mass in the distal esophagus and subcutaneous tissue of the neck.  He started on palliative radiation, and chemotherapy.   Presented to the ED on 10/29 with complaints of intractable nausea and vomiting.  Reports he had been n.p.o. for a CT on Thursday the week prior.  Afterwards he got home and started feeling bad and proceeded to vomit.  Reported he was unable to keep anything down.  He then complained of dizziness, lightheadedness and fatigue.  CT chest/abdomen/pelvis showed soft tissue thickening in the distal esophagus.  CT neck with no adenopathy or abnormalities.  Admission labs sodium 129, potassium 3.6, creatinine 1.5, albumin 2.9, WBC 4.4, hemoglobin 14.2, platelets 114,000.  He was admitted to internal medicine for further management of dehydration, intractable nausea and vomiting, hyponatremia.   He was evaluated by oncology and  it was felt his chemotherapy was likely not the culprit for his nausea and vomiting.  There was question whether he may have formed an esophageal stricture and recommendations were given to  consult GI.  Underwent barium swallow on 10/31 which was negative for stricture.  He developed chest pain the morning of 11/1 with notable elevation in heart rate.  EKG was done showing atrial fibrillation, heart rate 99, PVCs. Reported the chest pain was more like a burning sensation, seems worse depending on what he eats or drinks. Remained rate controlled without the need of medication.  Echo 11/1 reported LVEF at least mild to moderately depressed with hypokinesis of the distal half of the LV.  Recommendations for repeat limited echo with Definity. Cardiology now asked to evaluate in the setting of atrial fibrillation with consideration of anticoagulation.   Past Medical History:  Diagnosis Date   Arthritis    Cancer ([place])    Coronary atherosclerosis of native coronary artery    Depression    Esophageal cancer (HCC)    Essential tremor    High cholesterol    Hypertension    Hypertension    Hypogonadism male    Obstructive chronic bronchitis with exacerbation (HCC)    Obstructive sleep apnea (adult) (pediatric)    no cpap   Other and unspecified hyperlipidemia    Other emphysema ([place])    Proteinuria 2019   has seen a nephrologist   Shortness of breath    with excertion   Situational stress    Type II or unspecified type diabetes mellitus without mention of complication, not stated as uncontrolled    type 2   Unspecified essential hypertension     Past Surgical History:  Procedure Laterality Date   CARDIAC CATHETERIZATION     IR IMAGING GUIDED PORT INSERTION  04/24/2021   IR KYPHO LUMBAR INC FX REDUCE BONE BX UNI/BIL CANNULATION INC/IMAGING  06/13/2018   L lens implant     R knee replacement x2     REVERSE SHOULDER ARTHROPLASTY Left 03/26/2019   Procedure: REVERSE SHOULDER ARTHROPLASTY;  Surgeon: Justice Britain, MD;  Location: [facility];  Service: Orthopedics;  Laterality: Left;  142min   TONSILLECTOMY     TONSILLECTOMY AND ADENOIDECTOMY     vascectomy       Home Medications:   Prior to Admission medications   Medication Sig Start Date End Date Taking? Authorizing Provider  acetaminophen (TYLENOL) 500 MG tablet Take 650 mg by mouth every 8 (eight) hours as needed for mild pain (pain).   Yes [provider]  albuterol (VENTOLIN HFA) 108 (90 Base) MCG/ACT inhaler Inhale 1 puff into the lungs every 6 (six) hours as needed for wheezing. 04/24/21  Yes [provider]  ALPRAZolam Duanne Moron) 0.5 MG tablet Take 0.5 mg by mouth 2 (two) times daily as needed for anxiety.   Yes [provider]  atorvastatin (LIPITOR) 40 MG tablet Take 1 tablet (40 mg total) by mouth daily. 07/23/12  Yes Weber, Damaris Hippo, PA-C  Calcium Carb-Cholecalciferol (CALCIUM 600+D3 PO) Take 1 tablet by mouth daily.   Yes [provider]  Cholecalciferol (VITAMIN D) 50 MCG (2000 UT) tablet Take 2,000 Units by mouth daily.   Yes [provider]  escitalopram (LEXAPRO) 10 MG tablet Take 10 mg by mouth daily.   Yes [provider]  gabapentin (NEURONTIN) 300 MG capsule Take 2 capsules (600 mg total) by mouth 3 (three) times daily. Patient taking differently: Take 300 mg by mouth 3 (  three) times daily. 10/31/18  Yes Kristeen Miss, MD  lidocaine-prilocaine (EMLA) cream Apply 1 application topically as needed. 04/24/21  Yes Orson Slick, MD  Multiple Vitamins-Minerals (CENTRUM SILVER) tablet daily in the afternoon.   Yes [provider]  MYRBETRIQ 50 MG TB24 tablet Take 50 mg by mouth daily. 12/26/20  Yes [provider]  ondansetron (ZOFRAN) 8 MG tablet Take 1 tablet (8 mg total) by mouth every 8 (eight) hours as needed for nausea or vomiting. 04/24/21  Yes Orson Slick, MD  oxyCODONE-acetaminophen (PERCOCET) 10-325 MG tablet Take 1 tablet by mouth every 6 (six) hours as needed. 06/01/21  Yes [provider]  primidone (MYSOLINE) 50 MG tablet Take 1 tablet (50 mg total) by mouth in the morning and at bedtime. 06/21/21  Yes Tat, Eustace Quail, DO   prochlorperazine (COMPAZINE) 10 MG tablet Take 1 tablet (10 mg total) by mouth every 6 (six) hours as needed for nausea or vomiting. 04/24/21  Yes Orson Slick, MD  rOPINIRole (REQUIP) 0.5 MG tablet Take 0.5 mg by mouth at bedtime.   Yes [provider]  Semaglutide,0.25 or 0.5MG /DOS, (OZEMPIC, 0.25 OR 0.5 MG/DOSE,) 2 MG/1.5ML SOPN Ozempic 0.25 mg or 0.5 mg (2 mg/1.5 mL) subcutaneous pen injector  INJECT 0.5 MG SUBCUTANEOUSLY ONCE WEEKLY   Yes [provider]  tamsulosin (FLOMAX) 0.4 MG CAPS capsule Take 0.4 mg by mouth daily. 01/03/21  Yes [provider]  TESTOSTERONE IM Inject 1 mL into the muscle every 14 (fourteen) days.   Yes [provider]  tiZANidine (ZANAFLEX) 4 MG tablet Take 4 mg by mouth 3 (three) times daily as needed. 01/23/21  Yes [provider]  trolamine salicylate (ASPERCREME) 10 % cream Apply 1 application topically 2 (two) times daily as needed for muscle pain.    Yes [provider]  lisinopril (ZESTRIL) 5 MG tablet Take 5 mg by mouth daily. Patient not taking: No sig reported 06/13/21   [provider]  predniSONE (DELTASONE) 50 MG tablet Take 1 tablet 13 hours, 7 hours and 1 hour prior to CT Scan 07/12/21   Orson Slick, MD  sulfamethoxazole-trimethoprim (BACTRIM DS) 800-160 MG tablet Take 1 tablet by mouth 2 (two) times daily. Patient not taking: Reported on 07/15/2021 06/06/21   [provider]    Inpatient Medications: Scheduled Meds:  acidophilus  1 capsule Oral Daily   enoxaparin (LOVENOX) injection  40 mg Subcutaneous Q24H   escitalopram  10 mg Oral Daily   gabapentin  300 mg Oral TID   insulin aspart  0-9 Units Subcutaneous TID WC   nicotine  21 mg Transdermal Daily   ondansetron (ZOFRAN) IV  4 mg Intravenous Q8H   pantoprazole  40 mg Oral BID   primidone  50 mg Oral BID   rOPINIRole  0.5 mg Oral QHS   sucralfate  1 g Oral TID WC & HS   tamsulosin  0.4 mg Oral Daily    Tbo-filgastrim (GRANIX) SQ  480 mcg Subcutaneous q1800   Continuous Infusions:   PRN Meds: acetaminophen, albuterol, ALPRAZolam, loperamide, morphine injection, oxyCODONE-acetaminophen **AND** oxyCODONE, perflutren lipid microspheres (DEFINITY) IV suspension, prochlorperazine, tiZANidine  Allergies:    Allergies  Allergen Reactions   Iodinated Diagnostic Agents Anaphylaxis   Moxifloxacin Swelling    REACTION: GI upset, throat "tightened up"   Penicillins Anaphylaxis    Did it involve swelling of the face/tongue/throat, SOB, or low BP? Yes Did it involve sudden or severe  rash/hives, skin peeling, or any reaction on the inside of your mouth or nose? No Did you need to seek medical attention at a hospital or doctor's office? Yes When did it last happen?   been a while     If all above answers are "NO", may proceed with cephalosporin use.    Shellfish-Derived Products Anaphylaxis   Amoxicillin Other (See Comments)   Chlorhexidine     Port not accessed   Other Other (See Comments)   Latex Rash    Over long periods of time    Social History:   Social History   Socioeconomic History   Marital status: Married    Spouse name: Not on file   Number of children: Not on file   Years of education: Not on file   Highest education level: Not on file  Occupational History   Occupation: Landscape architect  Tobacco Use   Smoking status: Every Day    Packs/day: 1.00    Years: 55.00    Pack years: 55.00    Types: Cigarettes   Smokeless tobacco: Never  Vaping Use   Vaping Use: Never used  Substance and Sexual Activity   Alcohol use: Yes    Alcohol/week: 1.0 standard drink    Types: 1 Cans of beer per week    Comment: occasional beer   Drug use: Never   Sexual activity: Not Currently  Other Topics Concern   Not on file  Social History Narrative   ** Merged History Encounter **       Social Determinants of Health   Financial Resource Strain: Not on file  Food  Insecurity: Not on file  Transportation Needs: Not on file  Physical Activity: Not on file  Stress: Not on file  Social Connections: Not on file  Intimate Partner Violence: Not on file    Family History:    Family History  Problem Relation Age of Onset   Emphysema Father    Heart disease Father    Clotting disorder Mother    Kidney cancer Child      ROS:  Please see the history of present illness.   All other ROS reviewed and negative.     Physical Exam/Data:   Vitals:   07/18/21 0700 07/18/21 1500 07/18/21 2147 07/19/21 0718  BP: 127/79 133/89 102/74 122/73  Pulse: (!) 102 99 (!) 101 99  Resp: 18 19 16 17   Temp: 98.5 F (36.9 C) 98.2 F (36.8 C) (!) 97.3 F (36.3 C) 97.7 F (36.5 C)  TempSrc: Oral Oral Oral Oral  SpO2: 96% 96% 96% 96%  Weight:      Height:        Intake/Output Summary (Last 24 hours) at 07/19/2021 1140 Last data filed at 07/19/2021 0800 Gross per 24 hour  Intake 360 ml  Output --  Net 360 ml   Last 3 Weights 07/15/2021 07/05/2021 06/22/2021  Weight (lbs) 170 lb 175 lb 4.8 oz 176 lb 3 oz  Weight (kg) 77.111 kg 79.516 kg 79.918 kg     Body mass index is 27.44 kg/m.  General:  Pale, ill-appearing older male HEENT: normal Neck: no JVD Vascular: No carotid bruits; Distal pulses 2+ bilaterally Cardiac:  normal S1, S2; Irreg Irreg; soft systolic murmur  Lungs:  clear to auscultation bilaterally, no wheezing, rhonchi or rales  Abd: soft, nontender, no hepatomegaly  Ext: no edema Musculoskeletal:  No deformities, BUE and BLE strength normal and equal Skin: warm and dry  Neuro:  CNs 2-12 intact, no focal abnormalities noted Psych:  Normal affect   EKG:  The EKG was personally reviewed and demonstrates:  Afib rate 99 bpm, PVCs Telemetry:  Telemetry was personally reviewed and demonstrates:  Afib rates 90s-100  Relevant CV Studies:  Echo: 07/18/21  IMPRESSIONS     1. LVEF appears at least mild to moderately depressed with hypokinesis of   the distal half of the LV WOuld recomm Limited echo with Definity to  confirm LVEF and wall motion. . The left ventricle has no regional wall  motion abnormalities. Left ventricular   diastolic parameters are indeterminate.   2. Right ventricular systolic function is low normal. The right  ventricular size is normal.   3. Trivial mitral valve regurgitation. Moderate mitral annular  calcification.   4. Aortic valve regurgitation is not visualized. Mild to moderate aortic  valve sclerosis/calcification is present, without any evidence of aortic  stenosis.   5. The inferior vena cava is normal in size with greater than 50%  respiratory variability, suggesting right atrial pressure of 3 mmHg.   FINDINGS   Left Ventricle: LVEF appears at least mild to moderately depressed with  hypokinesis of the distal half of the LV WOuld recomm Limited echo with  Definity to confirm LVEF and wall motion. The left ventricle has no  regional wall motion abnormalities. The  left ventricular internal cavity size was normal in size. There is no left  ventricular hypertrophy. Left ventricular diastolic parameters are  indeterminate.   Right Ventricle: The right ventricular size is normal. Right vetricular  wall thickness was not assessed. Right ventricular systolic function is  low normal.   Left Atrium: Left atrial size was normal in size.   Right Atrium: Right atrial size was normal in size.   Pericardium: There is no evidence of pericardial effusion.   Mitral Valve: There is mild thickening of the mitral valve leaflet(s).  Moderate mitral annular calcification. Trivial mitral valve regurgitation.   Tricuspid Valve: The tricuspid valve is normal in structure. Tricuspid  valve regurgitation is trivial.   Aortic Valve: Aortic valve regurgitation is not visualized. Mild to  moderate aortic valve sclerosis/calcification is present, without any  evidence of aortic stenosis. Aortic valve mean gradient  measures 6.7 mmHg.  Aortic valve peak gradient measures 16.2  mmHg.   Pulmonic Valve: The pulmonic valve was not well visualized. Pulmonic valve  regurgitation is not visualized. No evidence of pulmonic stenosis.   Aorta: The aortic root is normal in size and structure.   Venous: The inferior vena cava is normal in size with greater than 50%  respiratory variability, suggesting right atrial pressure of 3 mmHg.   IAS/Shunts: No atrial level shunt detected by color flow Doppler.   Laboratory Data:  High Sensitivity Troponin:  No results for input(s): TROPONINIHS in the last 720 hours.   Chemistry Recent Labs  Lab 07/17/21 0225 07/18/21 0229 07/19/21 0318  NA 125* 127* 131*  K 2.9* 3.6 4.3  CL 95* 100 107  CO2 23 20* 19*  GLUCOSE 118* 100* 102*  BUN 18 14 9   CREATININE 0.94 0.76 0.80  CALCIUM 7.7* 7.5* 7.7*  MG 2.0 1.9 1.7  GFRNONAA >60 >60 >60  ANIONGAP 7 7 5     Recent Labs  Lab 07/16/21 0159 07/17/21 0225 07/18/21 0229  PROT 5.2* 4.4* 4.4*  ALBUMIN 2.3* 1.9* 1.9*  AST 44* 30 25  ALT 37 26 22  ALKPHOS 57 49 47  BILITOT 0.7 0.6 0.6  Lipids No results for input(s): CHOL, TRIG, HDL, LABVLDL, LDLCALC, CHOLHDL in the last 168 hours.  Hematology Recent Labs  Lab 07/17/21 0225 07/18/21 0229 07/19/21 0318  WBC 1.2* 1.9* 8.6  RBC 3.35* 3.56* 3.52*  HGB 10.7* 11.5* 11.3*  HCT 31.6* 33.0* 33.5*  MCV 94.3 92.7 95.2  MCH 31.9 32.3 32.1  MCHC 33.9 34.8 33.7  RDW 19.6* 19.5* 20.4*  PLT 81* 78* 81*   Thyroid No results for input(s): TSH, FREET4 in the last 168 hours.  BNPNo results for input(s): BNP, PROBNP in the last 168 hours.  DDimer No results for input(s): DDIMER in the last 168 hours.   Radiology/Studies:  ECHOCARDIOGRAM COMPLETE  Result Date: 07/18/2021    ECHOCARDIOGRAM REPORT   Patient Name:   Justin Trujillo. Date of Exam: 07/18/2021 Medical Rec #:  [id number]          Height:       66.0 in Accession #:    [id number]         Weight:       170.0 lb  Date of Birth:  02/19/1946          BSA:          1.866 m Patient Age:    13 years           BP:           127/79 mmHg Patient Gender: M                  HR:           114 bpm. Exam Location:  Inpatient Procedure: 2D Echo, Cardiac Doppler and Color Doppler Indications:    I48.1 Persistent atrial fibrillation  History:        Patient has no prior history of Echocardiogram examinations.                 CAD, COPD, Signs/Symptoms:Dyspnea and Chest Pain; Risk                 Factors:Diabetes and Dyslipidemia.  Sonographer:    Glo Herring Referring Phys: [place]  1. LVEF appears at least mild to moderately depressed with hypokinesis of the distal half of the LV WOuld recomm Limited echo with Definity to confirm LVEF and wall motion. . The left ventricle has no regional wall motion abnormalities. Left ventricular  diastolic parameters are indeterminate.  2. Right ventricular systolic function is low normal. The right ventricular size is normal.  3. Trivial mitral valve regurgitation. Moderate mitral annular calcification.  4. Aortic valve regurgitation is not visualized. Mild to moderate aortic valve sclerosis/calcification is present, without any evidence of aortic stenosis.  5. The inferior vena cava is normal in size with greater than 50% respiratory variability, suggesting right atrial pressure of 3 mmHg. FINDINGS  Left Ventricle: LVEF appears at least mild to moderately depressed with hypokinesis of the distal half of the LV WOuld recomm Limited echo with Definity to confirm LVEF and wall motion. The left ventricle has no regional wall motion abnormalities. The left ventricular internal cavity size was normal in size. There is no left ventricular hypertrophy. Left ventricular diastolic parameters are indeterminate. Right Ventricle: The right ventricular size is normal. Right vetricular wall thickness was not assessed. Right ventricular systolic function is low normal. Left Atrium: Left  atrial size was normal in size. Right Atrium: Right atrial size was normal in size. Pericardium: There is no evidence of pericardial effusion.  Mitral Valve: There is mild thickening of the mitral valve leaflet(s). Moderate mitral annular calcification. Trivial mitral valve regurgitation. Tricuspid Valve: The tricuspid valve is normal in structure. Tricuspid valve regurgitation is trivial. Aortic Valve: Aortic valve regurgitation is not visualized. Mild to moderate aortic valve sclerosis/calcification is present, without any evidence of aortic stenosis. Aortic valve mean gradient measures 6.7 mmHg. Aortic valve peak gradient measures 16.2 mmHg. Pulmonic Valve: The pulmonic valve was not well visualized. Pulmonic valve regurgitation is not visualized. No evidence of pulmonic stenosis. Aorta: The aortic root is normal in size and structure. Venous: The inferior vena cava is normal in size with greater than 50% respiratory variability, suggesting right atrial pressure of 3 mmHg. IAS/Shunts: No atrial level shunt detected by color flow Doppler.  LEFT VENTRICLE PLAX 2D LVIDd:         3.50 cm LVIDs:         2.50 cm LV PW:         1.10 cm LV IVS:        1.00 cm  RIGHT VENTRICLE          IVC RV Basal diam:  3.90 cm  IVC diam: 1.80 cm LEFT ATRIUM           Index        RIGHT ATRIUM           Index LA diam:      3.80 cm 2.04 cm/m   RA Area:     17.60 cm LA Vol (A4C): 53.1 ml 28.45 ml/m  RA Volume:   41.80 ml  22.40 ml/m  AORTIC VALVE                    PULMONIC VALVE AV Vmax:           201.33 cm/s  PV Vmax:       1.32 m/s AV Vmean:          117.667 cm/s PV Peak grad:  7.0 mmHg AV VTI:            0.255 m AV Peak Grad:      16.2 mmHg AV Mean Grad:      6.7 mmHg LVOT Vmax:         90.62 cm/s LVOT Vmean:        58.950 cm/s LVOT VTI:          0.128 m LVOT/AV VTI ratio: 0.50  AORTA Ao Root diam: 3.60 cm  SHUNTS Systemic VTI: 0.13 m Dorris Carnes MD Electronically signed by Dorris Carnes MD Signature Date/Time: 07/18/2021/7:57:11 PM     Final    DG ESOPHAGUS W SINGLE CM (SOL OR THIN BA)  Result Date: 07/17/2021 CLINICAL DATA:  History of esophageal cancer with nausea and vomiting. EXAM: ESOPHAGUS/BARIUM SWALLOW TECHNIQUE: Single contrast examination was performed using thin liquid barium. This exam was performed by Soyla Dryer, and was supervised and interpreted by Abigail Miyamoto. FLUOROSCOPY TIME:  Radiation Exposure Index (as provided by the fluoroscopic device): Not applicable. If the device does not provide the exposure index: Fluoroscopy Time:  1 minute and 36 seconds Number of Acquired Images:  None COMPARISON:  Chest CT 07/13/2021 FINDINGS: Focused exam was performed with the patient supine and in various obliquities. Focused single-contrast exam performed. Full column evaluation of the esophagus demonstrates no persistent narrowing or stricture. Spontaneous gastroesophageal reflux is identified to the level of the clavicles. A 13 mm barium tablet passes promptly. IMPRESSION: Single-contrast, focused exam performed as  detailed above. No esophageal stricture. Spontaneous gastroesophageal reflux. Electronically Signed   By: Abigail Miyamoto M.D.   On: 07/17/2021 09:48     Assessment and Plan:   Justin Junkins. is a 75 y.o. 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 Sloan Leiter.  Paroxysmal atrial fibrillation: Denies any hx of the same. Developed the morning of 11/1 with associated chest pain. Rates are mostly controlled without any rate controlling agents. Anticoagulation was initially held in the setting of thrombocytopenia. He denies any palpitations.  -- This patients CHA2DS2-VASc Score at least 5 but difficult situation in the setting of thrombocytopenia. He is also followed by  Oncology as an outpatient. I reviewed the diagnosis of afib with him and wife at the bedside. Discussed the role of anticoagulation as well.  --Will start IV amiodarone with bolus in attempts to convert, along with conversion to IV heparin from lovenox. Seems reasonable to start on [place] if his counts remain stable (will attempt reach out to oncology as well)  HFrEF?: Echo 11/1 noted concern for mild to moderate LV dysfunction with recommendations for repeat limited echo today. Images pending read. Does not appear significantly volume overloaded on exam. -- pending reading, consider addition of BB therapy  Esophageal adenocarcinoma: Underwent cycle of palliative radiation, currently receiving chemotherapy --Follows with Dr. Lorenso Courier as an outpatient  Nausea/vomiting/dehydration: this has significantly improved since admission. Has been able to eat several meals without vomiting and diarrhea has slowed as well  Thrombocytopenia: counts have been on the low side the past couple of months but since admission have dipped in the 80, upper 70 range. Undergoing chemotherapy with last cycle 10/19. He has also been receiving Lovenox for DVT prophylaxis  DM: Hgb A1c 5.9 -- per primary  Tobacco use: he continues to smoke  Risk Assessment/Risk Scores:    CHA2DS2-VASc Score = 5  This indicates a 7.2% annual risk of stroke. The patient's score is based upon: CHF History: 1 HTN History: 1 Diabetes History: 1 Stroke History: 0 Vascular Disease History: 0 Age Score: 2 Gender Score: 0   For questions or updates, please contact [place] Please consult [email or web address] for contact info under    Signed, Reino Bellis, NP  07/19/2021 11:40 AM

## 2021-07-19 NOTE — Progress Notes (Signed)
ANTICOAGULATION CONSULT NOTE - Initial Consult  Pharmacy Consult:  Heparin Indication: atrial fibrillation  Allergies  Allergen Reactions   Iodinated Diagnostic Agents Anaphylaxis   Moxifloxacin Swelling    REACTION: GI upset, throat "tightened up"   Penicillins Anaphylaxis    Did it involve swelling of the face/tongue/throat, SOB, or low BP? Yes Did it involve sudden or severe rash/hives, skin peeling, or any reaction on the inside of your mouth or nose? No Did you need to seek medical attention at a hospital or doctor's office? Yes When did it last happen?   been a while     If all above answers are "NO", may proceed with cephalosporin use.    Shellfish-Derived Products Anaphylaxis   Amoxicillin Other (See Comments)   Chlorhexidine     Port not accessed   Other Other (See Comments)   Latex Rash    Over long periods of time    Patient Measurements: Height: 5\' 6"  (167.6 cm) Weight: 77.1 kg (170 lb) IBW/kg (Calculated) : 63.8 Heparin Dosing Weight: 77 kg  Vital Signs: Temp: 97.9 F (36.6 C) (11/02 2019) Temp Source: Oral (11/02 2019) BP: 99/62 (11/02 2019) Pulse Rate: 89 (11/02 2019)  Labs: Recent Labs    07/17/21 0225 07/18/21 0229 07/19/21 0318 07/19/21 1936  HGB 10.7* 11.5* 11.3*  --   HCT 31.6* 33.0* 33.5*  --   PLT 81* 78* 81*  --   HEPARINUNFRC  --   --   --  <0.10*  CREATININE 0.94 0.76 0.80  --      Estimated Creatinine Clearance: 76.8 mL/min (by C-G formula based on SCr of 0.8 mg/dL).   Medical History: Past Medical History:  Diagnosis Date   Arthritis    Cancer ([place])    Coronary atherosclerosis of native coronary artery    Depression    Esophageal cancer (HCC)    Essential tremor    High cholesterol    Hypertension    Hypertension    Hypogonadism male    Obstructive chronic bronchitis with exacerbation (HCC)    Obstructive sleep apnea (adult) (pediatric)    no cpap   Other and unspecified hyperlipidemia    Other emphysema ([place])     Proteinuria 2019   has seen a nephrologist   Shortness of breath    with excertion   Situational stress    Type II or unspecified type diabetes mellitus without mention of complication, not stated as uncontrolled    type 2   Unspecified essential hypertension      Assessment: 47 YOM with metastatic esophageal cancer admitted with intractable nausea and vomiting.  Now with new Afib and Pharmacy consulted to dose IV heparin.  Cards aware of thrombocytopenia; no bleeding observed.   Heparin level undetectable (on 1000 units/hr; level drawn after only 3.5 hours on drip) No signs/symptoms of bleed  Goal of Therapy:  Heparin level 0.3-0.7 units/ml Monitor platelets by anticoagulation protocol: Yes   Plan:  Will cautiously increase heparin to 1100 units/hr as level undetectable even though drawn early Plan for level with am labs to guide further changes Daily heparin level and CBC ordered Monitor closely for signs/symptoms of bleed with current thrombocytopenia  Thank you for allowing pharmacy to be a part of this patient's care.  Donnald Garre, PharmD Clinical Pharmacist  Please check AMION for all [place] numbers After 10:00 PM, call [place] [phone]

## 2021-07-19 NOTE — Progress Notes (Signed)
Mobility Specialist Progress Note   07/19/21 1315  Mobility  Activity Ambulated in room  Level of Assistance Contact guard assist, steadying assist  Assistive Device Front wheel walker  Distance Ambulated (ft) 25 ft  Mobility Ambulated with assistance in room  Mobility Response Tolerated well  Mobility performed by Mobility specialist  $Mobility charge 1 Mobility   Received pt on BSC unable to void successfully, agreeable to mobility session and denying any symptoms. StandbyA for STS, x1 LOB that was quickly regained through contact guard. Pt returned back to [facility] to attempt another void. Left call bell within reach and wife was in the room.    Holland Falling Mobility Specialist Phone Number [phone]

## 2021-07-19 NOTE — Progress Notes (Signed)
ANTICOAGULATION CONSULT NOTE - Initial Consult  Pharmacy Consult:  Heparin Indication: atrial fibrillation  Allergies  Allergen Reactions   Iodinated Diagnostic Agents Anaphylaxis   Moxifloxacin Swelling    REACTION: GI upset, throat "tightened up"   Penicillins Anaphylaxis    Did it involve swelling of the face/tongue/throat, SOB, or low BP? Yes Did it involve sudden or severe rash/hives, skin peeling, or any reaction on the inside of your mouth or nose? No Did you need to seek medical attention at a hospital or doctor's office? Yes When did it last happen?   been a while     If all above answers are "NO", may proceed with cephalosporin use.    Shellfish-Derived Products Anaphylaxis   Amoxicillin Other (See Comments)   Chlorhexidine     Port not accessed   Other Other (See Comments)   Latex Rash    Over long periods of time    Patient Measurements: Height: 5\' 6"  (167.6 cm) Weight: 77.1 kg (170 lb) IBW/kg (Calculated) : 63.8 Heparin Dosing Weight: 77 kg  Vital Signs: Temp: 97.7 F (36.5 C) (11/02 0718) Temp Source: Oral (11/02 0718) BP: 122/73 (11/02 0718) Pulse Rate: 99 (11/02 0718)  Labs: Recent Labs    07/17/21 0225 07/18/21 0229 07/19/21 0318  HGB 10.7* 11.5* 11.3*  HCT 31.6* 33.0* 33.5*  PLT 81* 78* 81*  CREATININE 0.94 0.76 0.80    Estimated Creatinine Clearance: 76.8 mL/min (by C-G formula based on SCr of 0.8 mg/dL).   Medical History: Past Medical History:  Diagnosis Date   Arthritis    Cancer ([place])    Coronary atherosclerosis of native coronary artery    Depression    Esophageal cancer (HCC)    Essential tremor    High cholesterol    Hypertension    Hypertension    Hypogonadism male    Obstructive chronic bronchitis with exacerbation (HCC)    Obstructive sleep apnea (adult) (pediatric)    no cpap   Other and unspecified hyperlipidemia    Other emphysema ([place])    Proteinuria 2019   has seen a nephrologist   Shortness of breath    with  excertion   Situational stress    Type II or unspecified type diabetes mellitus without mention of complication, not stated as uncontrolled    type 2   Unspecified essential hypertension      Assessment: 72 YOM with metastatic esophageal cancer admitted with intractable nausea and vomiting.  Now with new Afib and Pharmacy consulted to dose IV heparin.  Cards aware of thrombocytopenia; no bleeding observed.   Goal of Therapy:  Heparin level 0.3-0.7 units/ml Monitor platelets by anticoagulation protocol: Yes   Plan:  Heparin infusion at 1000 units/hr - no bolus with thrombocytopenia  Check 8 hr heparin level Daily heparin level and CBC  Coraima Tibbs D. Mina Marble, PharmD, BCPS, [place] 07/19/2021, 12:57 PM

## 2021-07-20 ENCOUNTER — Other Ambulatory Visit (HOSPITAL_COMMUNITY): Payer: Self-pay

## 2021-07-20 ENCOUNTER — Encounter: Payer: Self-pay | Admitting: Hematology and Oncology

## 2021-07-20 DIAGNOSIS — E1169 Type 2 diabetes mellitus with other specified complication: Secondary | ICD-10-CM | POA: Diagnosis not present

## 2021-07-20 DIAGNOSIS — I4819 Other persistent atrial fibrillation: Secondary | ICD-10-CM

## 2021-07-20 DIAGNOSIS — I10 Essential (primary) hypertension: Secondary | ICD-10-CM | POA: Diagnosis not present

## 2021-07-20 DIAGNOSIS — C155 Malignant neoplasm of lower third of esophagus: Secondary | ICD-10-CM | POA: Diagnosis not present

## 2021-07-20 DIAGNOSIS — R112 Nausea with vomiting, unspecified: Secondary | ICD-10-CM | POA: Diagnosis not present

## 2021-07-20 DIAGNOSIS — Z79899 Other long term (current) drug therapy: Secondary | ICD-10-CM

## 2021-07-20 DIAGNOSIS — E860 Dehydration: Secondary | ICD-10-CM | POA: Diagnosis not present

## 2021-07-20 DIAGNOSIS — D696 Thrombocytopenia, unspecified: Secondary | ICD-10-CM | POA: Diagnosis not present

## 2021-07-20 LAB — GLUCOSE, CAPILLARY
Glucose-Capillary: 102 mg/dL — ABNORMAL HIGH (ref 70–99)
Glucose-Capillary: 123 mg/dL — ABNORMAL HIGH (ref 70–99)
Glucose-Capillary: 109 mg/dL — ABNORMAL HIGH (ref 70–99)
Glucose-Capillary: 95 mg/dL (ref 70–99)

## 2021-07-20 LAB — BASIC METABOLIC PANEL
Anion gap: 5 (ref 5–15)
BUN: 6 mg/dL — ABNORMAL LOW (ref 8–23)
CO2: 21 mmol/L — ABNORMAL LOW (ref 22–32)
Calcium: 7.8 mg/dL — ABNORMAL LOW (ref 8.9–10.3)
Chloride: 103 mmol/L (ref 98–111)
Creatinine, Ser: 0.81 mg/dL (ref 0.61–1.24)
GFR, Estimated: >60 mL/min (ref >60)
Glucose, Bld: 91 mg/dL (ref 70–99)
Potassium: 3.9 mmol/L (ref 3.5–5.1)
Sodium: 129 mmol/L — ABNORMAL LOW (ref 135–145)

## 2021-07-20 LAB — CBC
HCT: 33.1 % — ABNORMAL LOW (ref 39.0–52.0)
Hemoglobin: 11.1 g/dL — ABNORMAL LOW (ref 13.0–17.0)
MCH: 32 pg (ref 26.0–34.0)
MCHC: 33.5 g/dL (ref 30.0–36.0)
MCV: 95.4 fL (ref 80.0–100.0)
Platelets: 85 10*3/uL — ABNORMAL LOW (ref 150–400)
RBC: 3.47 MIL/uL — ABNORMAL LOW (ref 4.22–5.81)
RDW: 20.8 % — ABNORMAL HIGH (ref 11.5–15.5)
WBC: 20 10*3/uL — ABNORMAL HIGH (ref 4.0–10.5)
nRBC: 0.4 % — ABNORMAL HIGH (ref 0.0–0.2)

## 2021-07-20 LAB — HEPARIN LEVEL (UNFRACTIONATED)
Heparin Unfractionated: 0.17 IU/mL — ABNORMAL LOW (ref 0.30–0.70)
Heparin Unfractionated: 0.24 IU/mL — ABNORMAL LOW (ref 0.30–0.70)

## 2021-07-20 MED ORDER — ONDANSETRON HCL 4 MG/2ML IJ SOLN: 4.0000 mg | Freq: Three times a day (TID) | INTRAMUSCULAR | Status: DC | PRN

## 2021-07-20 MED ORDER — FUROSEMIDE 40 MG PO TABS
40.0000 mg | ORAL_TABLET | Freq: Every day | ORAL | Status: DC
  Administered 2021-07-20: 40 mg via ORAL
  Filled 2021-07-20: qty 1

## 2021-07-20 MED ORDER — APIXABAN 5 MG PO TABS
5.0000 mg | ORAL_TABLET | Freq: Two times a day (BID) | ORAL | Status: DC
  Administered 2021-07-20 – 2021-07-21 (×3): 5 mg via ORAL
  Filled 2021-07-20 (×3): qty 1

## 2021-07-20 MED ORDER — POTASSIUM CHLORIDE CRYS ER 20 MEQ PO TBCR
20.0000 meq | EXTENDED_RELEASE_TABLET | Freq: Once | ORAL | Status: AC
  Administered 2021-07-20: 20 meq via ORAL
  Filled 2021-07-20: qty 1

## 2021-07-20 NOTE — Progress Notes (Addendum)
The patient has been seen in conjunction with Harlan Stains, NP-C. All aspects of care have been considered and discussed. The patient has been personally interviewed, examined, and all clinical data has been reviewed.  Persistent atrial fibrillation with relatively regular RR interval.  Recheck EKG this morning. On IV amiodarone which will be switched to oral therapy in a.m.  Hoping for spontaneous conversion on amnio and then chronic therapy to maintain sinus rhythm. If does not convert on amiodarone, medication will be continued and after 3 weeks of continuous anticoagulation, he will have outpatient cardioversion. Hopefully anticoagulation will be tolerated without bleeding.  Once back in sinus rhythm on amiodarone, we will have the option of trying the patient off anticoagulation therapy if all agree. Agree with converting to Eliquis today. Given esophageal adenocarcinoma, will not plan TEE cardioversion.  Progress Note  Patient Name: Justin Trujillo. Date of Encounter: 07/20/2021  [facility] Cardiologist: Cristopher Peru, MD   Subjective   Laying in bed, seems in better spirits. Willing to work with PT today.   Inpatient Medications    Scheduled Meds:  acidophilus  1 capsule Oral Daily   escitalopram  10 mg Oral Daily   gabapentin  300 mg Oral TID   insulin aspart  0-9 Units Subcutaneous TID WC   nicotine  21 mg Transdermal Daily   ondansetron (ZOFRAN) IV  4 mg Intravenous Q8H   pantoprazole  40 mg Oral BID   primidone  50 mg Oral BID   rOPINIRole  0.5 mg Oral QHS   sucralfate  1 g Oral TID WC & HS   tamsulosin  0.4 mg Oral Daily   Tbo-filgastrim (GRANIX) SQ  480 mcg Subcutaneous q1800   Continuous Infusions:  amiodarone 30 mg/hr (07/20/21 0428)   heparin 1,200 Units/hr (07/20/21 0428)   PRN Meds: acetaminophen, albuterol, ALPRAZolam, loperamide, morphine injection, oxyCODONE-acetaminophen **AND** oxyCODONE, prochlorperazine, tiZANidine   Vital Signs     Vitals:   07/19/21 2153 07/20/21 0039 07/20/21 0355 07/20/21 0748  BP: 115/75 105/70 111/79 125/77  Pulse: 89 87 91   Resp: 20 19 20 16   Temp:  97.9 F (36.6 C) 97.9 F (36.6 C) 97.9 F (36.6 C)  TempSrc:  Oral Oral Oral  SpO2: 97% 99% 99%   Weight:      Height:        Intake/Output Summary (Last 24 hours) at 07/20/2021 0844 Last data filed at 07/20/2021 0519 Gross per 24 hour  Intake 736.8 ml  Output --  Net 736.8 ml   Last 3 Weights 07/15/2021 07/05/2021 06/22/2021  Weight (lbs) 170 lb 175 lb 4.8 oz 176 lb 3 oz  Weight (kg) 77.111 kg 79.516 kg 79.918 kg      Telemetry    Afib rates 80-100 - Personally Reviewed  ECG    No new tracing this morning  Physical Exam   GEN: No acute distress.   Neck: No JVD Cardiac: Irreg Irreg, + soft systolic murmur, no rubs, or gallops.  Respiratory: Clear to auscultation bilaterally. GI: Soft, nontender, non-distended  MS: No edema; No deformity. Neuro:  Nonfocal  Psych: Normal affect   Labs    High Sensitivity Troponin:  No results for input(s): TROPONINIHS in the last 720 hours.   Chemistry Recent Labs  Lab 07/16/21 0159 07/16/21 0516 07/17/21 0225 07/18/21 0229 07/19/21 0318 07/20/21 0259  NA 129*  --  125* 127* 131* 129*  K 2.9*  --  2.9* 3.6 4.3 3.9  CL 96*  --  95* 100 107 103  CO2 22  --  23 20* 19* 21*  GLUCOSE 206*  --  118* 100* 102* 91  BUN 28*  --  18 14 9  6*  CREATININE 1.32*  --  0.94 0.76 0.80 0.81  CALCIUM 8.0*  --  7.7* 7.5* 7.7* 7.8*  MG  --    < > 2.0 1.9 1.7  --   PROT 5.2*  --  4.4* 4.4*  --   --   ALBUMIN 2.3*  --  1.9* 1.9*  --   --   AST 44*  --  30 25  --   --   ALT 37  --  26 22  --   --   ALKPHOS 57  --  49 47  --   --   BILITOT 0.7  --  0.6 0.6  --   --   GFRNONAA 56*  --  >60 >60 >60 >60  ANIONGAP 11  --  7 7 5 5    < > = values in this interval not displayed.    Lipids No results for input(s): CHOL, TRIG, HDL, LABVLDL, LDLCALC, CHOLHDL in the last 168 hours.  Hematology Recent  Labs  Lab 07/18/21 0229 07/19/21 0318 07/20/21 0259  WBC 1.9* 8.6 20.0*  RBC 3.56* 3.52* 3.47*  HGB 11.5* 11.3* 11.1*  HCT 33.0* 33.5* 33.1*  MCV 92.7 95.2 95.4  MCH 32.3 32.1 32.0  MCHC 34.8 33.7 33.5  RDW 19.5* 20.4* 20.8*  PLT 78* 81* 85*   Thyroid No results for input(s): TSH, FREET4 in the last 168 hours.  BNPNo results for input(s): BNP, PROBNP in the last 168 hours.  DDimer No results for input(s): DDIMER in the last 168 hours.   Radiology    ECHOCARDIOGRAM COMPLETE  Result Date: 07/18/2021    ECHOCARDIOGRAM REPORT   Patient Name:   Justin Trujillo. Date of Exam: 07/18/2021 Medical Rec #:  [id number]          Height:       66.0 in Accession #:    [id number]         Weight:       170.0 lb Date of Birth:  Jan 31, 1946          BSA:          1.866 m Patient Age:    75 years           BP:           127/79 mmHg Patient Gender: M                  HR:           114 bpm. Exam Location:  Inpatient Procedure: 2D Echo, Cardiac Doppler and Color Doppler Indications:    I48.1 Persistent atrial fibrillation  History:        Patient has no prior history of Echocardiogram examinations.                 CAD, COPD, Signs/Symptoms:Dyspnea and Chest Pain; Risk                 Factors:Diabetes and Dyslipidemia.  Sonographer:    Glo Herring Referring Phys: [place]  1. LVEF appears at least mild to moderately depressed with hypokinesis of the distal half of the LV WOuld recomm Limited echo with Definity to confirm LVEF and wall motion. . The left ventricle has no regional wall motion abnormalities. Left  ventricular  diastolic parameters are indeterminate.  2. Right ventricular systolic function is low normal. The right ventricular size is normal.  3. Trivial mitral valve regurgitation. Moderate mitral annular calcification.  4. Aortic valve regurgitation is not visualized. Mild to moderate aortic valve sclerosis/calcification is present, without any evidence of aortic stenosis.  5.  The inferior vena cava is normal in size with greater than 50% respiratory variability, suggesting right atrial pressure of 3 mmHg. FINDINGS  Left Ventricle: LVEF appears at least mild to moderately depressed with hypokinesis of the distal half of the LV WOuld recomm Limited echo with Definity to confirm LVEF and wall motion. The left ventricle has no regional wall motion abnormalities. The left ventricular internal cavity size was normal in size. There is no left ventricular hypertrophy. Left ventricular diastolic parameters are indeterminate. Right Ventricle: The right ventricular size is normal. Right vetricular wall thickness was not assessed. Right ventricular systolic function is low normal. Left Atrium: Left atrial size was normal in size. Right Atrium: Right atrial size was normal in size. Pericardium: There is no evidence of pericardial effusion. Mitral Valve: There is mild thickening of the mitral valve leaflet(s). Moderate mitral annular calcification. Trivial mitral valve regurgitation. Tricuspid Valve: The tricuspid valve is normal in structure. Tricuspid valve regurgitation is trivial. Aortic Valve: Aortic valve regurgitation is not visualized. Mild to moderate aortic valve sclerosis/calcification is present, without any evidence of aortic stenosis. Aortic valve mean gradient measures 6.7 mmHg. Aortic valve peak gradient measures 16.2 mmHg. Pulmonic Valve: The pulmonic valve was not well visualized. Pulmonic valve regurgitation is not visualized. No evidence of pulmonic stenosis. Aorta: The aortic root is normal in size and structure. Venous: The inferior vena cava is normal in size with greater than 50% respiratory variability, suggesting right atrial pressure of 3 mmHg. IAS/Shunts: No atrial level shunt detected by color flow Doppler.  LEFT VENTRICLE PLAX 2D LVIDd:         3.50 cm LVIDs:         2.50 cm LV PW:         1.10 cm LV IVS:        1.00 cm  RIGHT VENTRICLE          IVC RV Basal diam:  3.90  cm  IVC diam: 1.80 cm LEFT ATRIUM           Index        RIGHT ATRIUM           Index LA diam:      3.80 cm 2.04 cm/m   RA Area:     17.60 cm LA Vol (A4C): 53.1 ml 28.45 ml/m  RA Volume:   41.80 ml  22.40 ml/m  AORTIC VALVE                    PULMONIC VALVE AV Vmax:           201.33 cm/s  PV Vmax:       1.32 m/s AV Vmean:          117.667 cm/s PV Peak grad:  7.0 mmHg AV VTI:            0.255 m AV Peak Grad:      16.2 mmHg AV Mean Grad:      6.7 mmHg LVOT Vmax:         90.62 cm/s LVOT Vmean:        58.950 cm/s LVOT VTI:  0.128 m LVOT/AV VTI ratio: 0.50  AORTA Ao Root diam: 3.60 cm  SHUNTS Systemic VTI: 0.13 m Dorris Carnes MD Electronically signed by Dorris Carnes MD Signature Date/Time: 07/18/2021/7:57:11 PM    Final    ECHOCARDIOGRAM LIMITED  Result Date: 07/19/2021    ECHOCARDIOGRAM LIMITED REPORT   Patient Name:   Justin Trujillo. Date of Exam: 07/19/2021 Medical Rec #:  [id number]          Height:       66.0 in Accession #:    [id number]         Weight:       170.0 lb Date of Birth:  [birth year]          BSA:          1.866 m Patient Age:    75 years           BP:           122/73 mmHg Patient Gender: M                  HR:           108 bpm. Exam Location:  Inpatient Procedure: Limited Echo and Intracardiac Opacification Agent Indications:    Assess LVF w/ Definity  History:        Patient has prior history of Echocardiogram examinations, most                 recent 07/18/2021. CAD, COPD, Signs/Symptoms:Dyspnea and Chest                 Pain; Risk Factors:Diabetes, Hypertension and Dyslipidemia.  Sonographer:    Melissa Morford RDCS (AE, PE) Referring Phys: [place]  1. LV function is hyperdynamic EF 65-70%; Normal basal and mid segments. However the apex is akinetic indicating a possible stress cardiomyopathy vs. distal LAD disease. No LV thrombus. Recommend EKG and serial troponin levels. FINDINGS  Left Ventricle: LV function is hyperdynamic EF 65-70%; Normal basal and mid  segments. However the apex is akinetic indicating a possible stress cardiomyopathy vs. distal LAD disease. No LV thrombus. Recommend EKG and serial troponin levels. Phineas Inches Electronically signed by Phineas Inches Signature Date/Time: 07/19/2021/2:18:03 PM    Final     Cardiac Studies   Echo: 07/18/21   IMPRESSIONS     1. LVEF appears at least mild to moderately depressed with hypokinesis of  the distal half of the LV WOuld recomm Limited echo with Definity to  confirm LVEF and wall motion. . The left ventricle has no regional wall  motion abnormalities. Left ventricular   diastolic parameters are indeterminate.   2. Right ventricular systolic function is low normal. The right  ventricular size is normal.   3. Trivial mitral valve regurgitation. Moderate mitral annular  calcification.   4. Aortic valve regurgitation is not visualized. Mild to moderate aortic  valve sclerosis/calcification is present, without any evidence of aortic  stenosis.   5. The inferior vena cava is normal in size with greater than 50%  respiratory variability, suggesting right atrial pressure of 3 mmHg.   FINDINGS   Left Ventricle: LVEF appears at least mild to moderately depressed with  hypokinesis of the distal half of the LV WOuld recomm Limited echo with  Definity to confirm LVEF and wall motion. The left ventricle has no  regional wall motion abnormalities. The  left ventricular internal cavity size was normal in size. There is no left  ventricular  hypertrophy. Left ventricular diastolic parameters are  indeterminate.   Right Ventricle: The right ventricular size is normal. Right vetricular  wall thickness was not assessed. Right ventricular systolic function is  low normal.   Left Atrium: Left atrial size was normal in size.   Right Atrium: Right atrial size was normal in size.   Pericardium: There is no evidence of pericardial effusion.   Mitral Valve: There is mild thickening of the mitral  valve leaflet(s).  Moderate mitral annular calcification. Trivial mitral valve regurgitation.   Tricuspid Valve: The tricuspid valve is normal in structure. Tricuspid  valve regurgitation is trivial.   Aortic Valve: Aortic valve regurgitation is not visualized. Mild to  moderate aortic valve sclerosis/calcification is present, without any  evidence of aortic stenosis. Aortic valve mean gradient measures 6.7 mmHg.  Aortic valve peak gradient measures 16.2  mmHg.   Pulmonic Valve: The pulmonic valve was not well visualized. Pulmonic valve  regurgitation is not visualized. No evidence of pulmonic stenosis.   Aorta: The aortic root is normal in size and structure.   Venous: The inferior vena cava is normal in size with greater than 50%  respiratory variability, suggesting right atrial pressure of 3 mmHg.   IAS/Shunts: No atrial level shunt detected by color flow Doppler.   Limited Echo: 07/19/21  IMPRESSIONS     1. LV function is hyperdynamic EF 65-70%; Normal basal and mid segments.  However the apex is akinetic indicating a possible stress cardiomyopathy  vs. distal LAD disease. No LV thrombus. Recommend EKG and serial troponin  levels.   FINDINGS   Left Ventricle: LV function is hyperdynamic EF 65-70%; Normal basal and  mid segments. However the apex is akinetic indicating a possible stress  cardiomyopathy vs. distal LAD disease. No LV thrombus. Recommend EKG and  serial troponin levels.   Phineas Inches  Electronically signed by Phineas Inches  Signature Date/Time: 07/19/2021/2:18:03 PM   Patient Profile     75 y.o. 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 Sloan Leiter.  Assessment & Plan    Paroxysmal atrial fibrillation: Denies any  hx of the same. Developed the morning of 11/1 with associated chest pain. Rates was mostly controlled without any rate controlling agents. Anticoagulation was initially held in the setting of thrombocytopenia. He denies any palpitations.  -- This patients CHA2DS2-VASc Score at least 4 but difficult situation in the setting of thrombocytopenia. He is also followed by Oncology as an outpatient. Did touch base with oncology yesterday as well.  --Started on IV amiodarone with bolus yesterday. Remains in Afib rate controlled while at rest. Worked with PT yesterday but telemetry is not available. Need to ensure rates remain controlled with ambulation today. Would plan to transition to Eliquis today with stable counts and no bleeding.   HFpEF: Echo 11/1 noted concern for mild to moderate LV dysfunction with recommendations for repeat limited echo. Limited echo reported EF of 65-70% with akinetic apex. Does not appear significantly volume overloaded on exam.   Esophageal adenocarcinoma: Underwent cycle of palliative radiation, currently receiving chemotherapy --Follows with Dr. Lorenso Courier as an outpatient   Nausea/vomiting/dehydration: this has significantly improved since admission. Has been able to eat several meals without vomiting and diarrhea has slowed as well -- will change scheduled zofran to PRN as he is now on amiodarone  Thrombocytopenia: counts have been on the low side the past couple of months but since admission have dipped in the 80, upper 70 range. Undergoing chemotherapy with last cycle 10/19.  -- stable in the 80s today   Per Primary team: DM: Hgb A1c 5.9 Hypokalemia/hyponatremia: K+ 3.9, Na+ 129  Leukocytosis: WBC up to 20 this morning, in response to CA meds. Afebrile Tobacco use: he continues to smoke  For questions or updates, please contact [place] Please consult [email or web address] for contact info under        Signed, Reino Bellis, NP  07/20/2021, 8:44 AM

## 2021-07-20 NOTE — Progress Notes (Signed)
Mobility Specialist Progress Note   07/20/21 1323  Mobility  Activity Ambulated in hall  Level of Assistance Contact guard assist, steadying assist  Assistive Device Four wheel walker  Distance Ambulated (ft) 280 ft  Mobility Ambulated with assistance in hallway  Mobility Response Tolerated well  Mobility performed by Mobility specialist  $Mobility charge 1 Mobility   Received pt sitting EOB getting changed by wife. Pt presented no symptoms and was agreeable to mobility. Gait slow but steady, x1 seated break d/t fatigue and pain in back, pt claims pain is from s/p lumbar fusion in 2016. Returned back to EOB w/ tray in front, call bell by side and wife in room.  Holland Falling Mobility Specialist Phone Number [phone]

## 2021-07-20 NOTE — TOC Benefit Eligibility Note (Signed)
Patient Teacher, English as a foreign language completed.    The patient is currently admitted and upon discharge could be taking Eliquis 5 mg.  The current 30 day co-pay is, $128.97.   The patient is insured through [place], [place] Patient Advocate Specialist [place] Patient Advocate Team Direct Number: [phone]  Fax: [phone]

## 2021-07-20 NOTE — Progress Notes (Addendum)
PROGRESS NOTE        PATIENT DETAILS Name: Justin Trujillo. Age: 75 y.o. Sex: male Date of Birth: 03/10/1946 Admit Date: 07/15/2021 Admitting Physician Orma Flaming, MD WUJ:WJXBJ, Hal Hope, MD  Brief Narrative: Patient is a 75 y.o. male with history of metastatic esophageal cancer-s/p palliative radiation-on chemotherapy-presented to the hospital with recurrent nausea/vomiting.  See below for further details.   Subjective: Some mild nasal congestion today but no other complaints.  Had 1-2 loose stools again earlier this morning.  Asking if he could go home today  Objective: Vitals: Blood pressure 125/77, pulse 91, temperature 97.9 F (36.6 C), temperature source Oral, resp. rate 16, height 5\' 6"  (1.676 m), weight 77.1 kg, SpO2 99 %.   Exam: Gen Exam:Alert awake-not in any distress HEENT:atraumatic, normocephalic Chest: B/L clear to auscultation anteriorly CVS:S1S2 irregular Abdomen:soft non tender, non distended Extremities:+ edema Neurology: Non focal Skin: no rash   Pertinent Labs/Radiology: Recent Labs  Lab 07/15/21 1048 07/15/21 1711 07/16/21 0159 07/17/21 0225 07/18/21 0229 07/19/21 0318 07/20/21 0259  WBC 4.4  --  2.4* 1.2* 1.9*   < > 20.0*  HGB 14.2  --  12.1* 10.7* 11.5*   < > 11.1*  PLT 114*  --  93* 81* 78*   < > 85*  NA 129*   < > 129* 125* 127*   < > 129*  K 3.6   < > 2.9* 2.9* 3.6   < > 3.9  CREATININE 1.57*   < > 1.32* 0.94 0.76   < > 0.81  AST 62*  --  44* 30 25  --   --   ALT 42  --  37 26 22  --   --   ALKPHOS 69  --  57 49 47  --   --   BILITOT 1.2  --  0.7 0.6 0.6  --   --    < > = values in this interval not displayed.     10/27>> CT chest/abdomen/pelvis: Soft tissue thickening in the distal esophagus-no suspicious findings of metastatic disease. 10/27>> CT soft tissue neck: No new adenopathy-no acute abnormalities.   10/31>> barium esophagogram.  No esophageal stricture. 11/1>> Echo: EF appears mild to  moderately depressed 11/2>> Limited echo: EF 65-70% apex is akinetic.  Assessment/Plan: Intractable nausea/vomiting: Due to underlying malignancy/recent chemotherapy.  Barium esophagogram negative for strictures.  CT abdomen negative for SBO.  Significantly improved-change scheduled Zofran to as needed dosing.  Epigastric pain: Due to reflux/nausea/vomiting/distal esophageal adenocarcinoma-continues to have intermittent epigastric pain/dyspepsia like symptoms-continue PPI and Carafate.  May need to add H2 blocker if symptoms continue.    Hyponatremia: Due to dehydration-improved with hydration.  Currently no further IVF running-continue watch sodium levels closely-as he continues to have some mild hyponatremia.  He now has developed some lower extremity edema-we will try some oral Lasix today.  Recheck electrolytes tomorrow.    Hypokalemia: Due to GI loss-repleted.  New onset PAF: Continues to be in A. fib/flutter-rate in the low 100s.  Evaluated by cardiology on 11/2-started on anticoagulation and amiodarone infusion.  Cardiology planning to switch to oral amiodarone tomorrow.  Echo as above-recent TSH in August was stable.    Pancytopenia: Likely sequelae of chemotherapy-improving-no longer on Granix.  Distal esophageal adenocarcinoma: S/p palliative radiation completed on 7/20-last cycle of chemotherapy on 10/19.  Oncology following.  DM-2 (  A1c 5.9): CBG stable with SSI-continue to hold Ozempic.  Recent Labs    07/19/21 2135 07/20/21 0747 07/20/21 1140  GLUCAP 85 102* 123*     BPH: Continue Flomax  Essential tremor: Continue primidone/Requip  Peripheral neuropathy: Likely due to diabetes-continue Neurontin.  Depression/anxiety: Relatively stable-continue Lexapro and as needed Xanax.  Tobacco: Continue transdermal nicotine-counseled regarding importance of quitting.  BMI Estimated body mass index is 27.44 kg/m as calculated from the following:   Height as of this encounter:  5\' 6"  (1.676 m).   Weight as of this encounter: 77.1 kg.    Procedures:  Consults: GI, Onc DVT Prophylaxis:  Code Status:Full code  Family Communication: Spouse-[phone]-left voice mail on 11/1  Time spent: 57 minutes-Greater than 50% of this time was spent in counseling, explanation of diagnosis, planning of further management, and coordination of care.   Disposition Plan: Status is: Inpatient  Remains inpatient appropriate because: Intractable nausea/vomiting-unable to tolerate oral intake.  Continues to have hyponatremia.   Diet: Diet Order             DIET DYS 3 Room service appropriate? Yes; Fluid consistency: Thin; Fluid restriction: 1500 mL Fluid  Diet effective now                     Antimicrobial agents: Anti-infectives (From admission, onward)    None       MEDICATIONS: Scheduled Meds:  acidophilus  1 capsule Oral Daily   apixaban  5 mg Oral BID   escitalopram  10 mg Oral Daily   gabapentin  300 mg Oral TID   insulin aspart  0-9 Units Subcutaneous TID WC   nicotine  21 mg Transdermal Daily   pantoprazole  40 mg Oral BID   primidone  50 mg Oral BID   rOPINIRole  0.5 mg Oral QHS   sucralfate  1 g Oral TID WC & HS   tamsulosin  0.4 mg Oral Daily   Tbo-filgastrim (GRANIX) SQ  480 mcg Subcutaneous q1800   Continuous Infusions:  amiodarone 30 mg/hr (07/20/21 0428)   PRN Meds:.acetaminophen, albuterol, ALPRAZolam, loperamide, morphine injection, ondansetron (ZOFRAN) IV, oxyCODONE-acetaminophen **AND** oxyCODONE, prochlorperazine, tiZANidine   I have personally reviewed following labs and imaging studies  LABORATORY DATA: CBC: Recent Labs  Lab 07/15/21 1048 07/16/21 0159 07/17/21 0225 07/18/21 0229 07/19/21 0318 07/20/21 0259  WBC 4.4 2.4* 1.2* 1.9* 8.6 20.0*  NEUTROABS 3.7  --  0.6* 0.9*  --   --   HGB 14.2 12.1* 10.7* 11.5* 11.3* 11.1*  HCT 41.8 35.7* 31.6* 33.0* 33.5* 33.1*  MCV 95.9 95.7 94.3 92.7 95.2 95.4  PLT 114*  93* 81* 78* 81* 85*     Basic Metabolic Panel: Recent Labs  Lab 07/16/21 0159 07/16/21 0516 07/17/21 0225 07/18/21 0229 07/19/21 0318 07/20/21 0259  NA 129*  --  125* 127* 131* 129*  K 2.9*  --  2.9* 3.6 4.3 3.9  CL 96*  --  95* 100 107 103  CO2 22  --  23 20* 19* 21*  GLUCOSE 206*  --  118* 100* 102* 91  BUN 28*  --  18 14 9  6*  CREATININE 1.32*  --  0.94 0.76 0.80 0.81  CALCIUM 8.0*  --  7.7* 7.5* 7.7* 7.8*  MG  --  1.7 2.0 1.9 1.7  --      GFR: Estimated Creatinine Clearance: 75.8 mL/min (by C-G formula based on SCr of 0.81 mg/dL).  Liver Function Tests: Recent Labs  Lab 07/15/21 1048 07/16/21 0159 07/17/21 0225 07/18/21 0229  AST 62* 44* 30 25  ALT 42 37 26 22  ALKPHOS 69 57 49 47  BILITOT 1.2 0.7 0.6 0.6  PROT 6.3* 5.2* 4.4* 4.4*  ALBUMIN 2.9* 2.3* 1.9* 1.9*    Recent Labs  Lab 07/15/21 2140  LIPASE 16    No results for input(s): AMMONIA in the last 168 hours.  Coagulation Profile: No results for input(s): INR, PROTIME in the last 168 hours.  Cardiac Enzymes: No results for input(s): CKTOTAL, CKMB, CKMBINDEX, TROPONINI in the last 168 hours.  BNP (last 3 results) No results for input(s): PROBNP in the last 8760 hours.  Lipid Profile: No results for input(s): CHOL, HDL, LDLCALC, TRIG, CHOLHDL, LDLDIRECT in the last 72 hours.  Thyroid Function Tests: No results for input(s): TSH, T4TOTAL, FREET4, T3FREE, THYROIDAB in the last 72 hours.  Anemia Panel: No results for input(s): VITAMINB12, FOLATE, FERRITIN, TIBC, IRON, RETICCTPCT in the last 72 hours.  Urine analysis:    Component Value Date/Time   COLORURINE YELLOW 01/23/2010 [place] 01/23/2010 0934   LABSPEC 1.027 01/23/2010 0934   PHURINE 6.0 01/23/2010 0934   GLUCOSEU 250 (A) 01/23/2010 0934   HGBUR NEGATIVE 01/23/2010 0934   BILIRUBINUR NEGATIVE 01/23/2010 [place] 01/23/2010 0934   PROTEINUR 100 (A) 01/23/2010 0934   UROBILINOGEN 0.2 01/23/2010  0934   NITRITE NEGATIVE 01/23/2010 0934   LEUKOCYTESUR NEGATIVE 01/23/2010 0934    Sepsis Labs: Lactic Acid, Venous No results found for: LATICACIDVEN  MICROBIOLOGY: Recent Results (from the past 240 hour(s))  Resp Panel by RT-PCR (Flu A&B, Covid) Nasopharyngeal Swab     Status: None   Collection Time: 07/15/21 11:16 AM   Specimen: Nasopharyngeal Swab; Nasopharyngeal(NP) swabs in vial transport medium  Result Value Ref Range Status   SARS Coronavirus 2 by RT PCR NEGATIVE NEGATIVE Final    Comment: (NOTE) SARS-CoV-2 target nucleic acids are NOT DETECTED.  The SARS-CoV-2 RNA is generally detectable in upper respiratory specimens during the acute phase of infection. The lowest concentration of SARS-CoV-2 viral copies this assay can detect is 138 copies/mL. A negative result does not preclude SARS-Cov-2 infection and should not be used as the sole basis for treatment or other patient management decisions. A negative result may occur with  improper specimen collection/handling, submission of specimen other than nasopharyngeal swab, presence of viral mutation(s) within the areas targeted by this assay, and inadequate number of viral copies(<138 copies/mL). A negative result must be combined with clinical observations, patient history, and epidemiological information. The expected result is Negative.  Fact Sheet for Patients:  EntrepreneurPulse.com.au  Fact Sheet for Healthcare Providers:  IncredibleEmployment.be  This test is no t yet approved or cleared by the [place] FDA and  has been authorized for detection and/or diagnosis of SARS-CoV-2 by FDA under an Emergency Use Authorization (EUA). This EUA will remain  in effect (meaning this test can be used) for the duration of the COVID-19 declaration under Section 564(b)(1) of the Act, 21 U.S.C.section 360bbb-3(b)(1), unless the authorization is terminated  or revoked sooner.        Influenza A by PCR NEGATIVE NEGATIVE Final   Influenza B by PCR NEGATIVE NEGATIVE Final    Comment: (NOTE) The Xpert Xpress SARS-CoV-2/FLU/RSV plus assay is intended as an aid in the diagnosis of influenza from Nasopharyngeal swab specimens and should not be used as a sole basis for treatment. Nasal washings and aspirates are unacceptable for  Xpert Xpress SARS-CoV-2/FLU/RSV testing.  Fact Sheet for Patients: EntrepreneurPulse.com.au  Fact Sheet for Healthcare Providers: IncredibleEmployment.be  This test is not yet approved or cleared by the [place] FDA and has been authorized for detection and/or diagnosis of SARS-CoV-2 by FDA under an Emergency Use Authorization (EUA). This EUA will remain in effect (meaning this test can be used) for the duration of the COVID-19 declaration under Section 564(b)(1) of the Act, 21 U.S.C. section 360bbb-3(b)(1), unless the authorization is terminated or revoked.  Performed at [facility], [place] [address], [place], [place] [address]     RADIOLOGY STUDIES/RESULTS: ECHOCARDIOGRAM COMPLETE  Result Date: 07/18/2021    ECHOCARDIOGRAM REPORT   Patient Name:   Justin Trujillo. Date of Exam: 07/18/2021 Medical Rec #:  [id number]          Height:       66.0 in Accession #:    [id number]         Weight:       170.0 lb Date of Birth:  Jul 13, 1946          BSA:          1.866 m Patient Age:    48 years           BP:           127/79 mmHg Patient Gender: M                  HR:           114 bpm. Exam Location:  Inpatient Procedure: 2D Echo, Cardiac Doppler and Color Doppler Indications:    I48.1 Persistent atrial fibrillation  History:        Patient has no prior history of Echocardiogram examinations.                 CAD, COPD, Signs/Symptoms:Dyspnea and Chest Pain; Risk                 Factors:Diabetes and Dyslipidemia.  Sonographer:    Glo Herring Referring Phys: [place]  1. LVEF appears  at least mild to moderately depressed with hypokinesis of the distal half of the LV WOuld recomm Limited echo with Definity to confirm LVEF and wall motion. . The left ventricle has no regional wall motion abnormalities. Left ventricular  diastolic parameters are indeterminate.  2. Right ventricular systolic function is low normal. The right ventricular size is normal.  3. Trivial mitral valve regurgitation. Moderate mitral annular calcification.  4. Aortic valve regurgitation is not visualized. Mild to moderate aortic valve sclerosis/calcification is present, without any evidence of aortic stenosis.  5. The inferior vena cava is normal in size with greater than 50% respiratory variability, suggesting right atrial pressure of 3 mmHg. FINDINGS  Left Ventricle: LVEF appears at least mild to moderately depressed with hypokinesis of the distal half of the LV WOuld recomm Limited echo with Definity to confirm LVEF and wall motion. The left ventricle has no regional wall motion abnormalities. The left ventricular internal cavity size was normal in size. There is no left ventricular hypertrophy. Left ventricular diastolic parameters are indeterminate. Right Ventricle: The right ventricular size is normal. Right vetricular wall thickness was not assessed. Right ventricular systolic function is low normal. Left Atrium: Left atrial size was normal in size. Right Atrium: Right atrial size was normal in size. Pericardium: There is no evidence of pericardial effusion. Mitral Valve: There is mild thickening of the mitral valve leaflet(s).  Moderate mitral annular calcification. Trivial mitral valve regurgitation. Tricuspid Valve: The tricuspid valve is normal in structure. Tricuspid valve regurgitation is trivial. Aortic Valve: Aortic valve regurgitation is not visualized. Mild to moderate aortic valve sclerosis/calcification is present, without any evidence of aortic stenosis. Aortic valve mean gradient measures 6.7 mmHg. Aortic  valve peak gradient measures 16.2 mmHg. Pulmonic Valve: The pulmonic valve was not well visualized. Pulmonic valve regurgitation is not visualized. No evidence of pulmonic stenosis. Aorta: The aortic root is normal in size and structure. Venous: The inferior vena cava is normal in size with greater than 50% respiratory variability, suggesting right atrial pressure of 3 mmHg. IAS/Shunts: No atrial level shunt detected by color flow Doppler.  LEFT VENTRICLE PLAX 2D LVIDd:         3.50 cm LVIDs:         2.50 cm LV PW:         1.10 cm LV IVS:        1.00 cm  RIGHT VENTRICLE          IVC RV Basal diam:  3.90 cm  IVC diam: 1.80 cm LEFT ATRIUM           Index        RIGHT ATRIUM           Index LA diam:      3.80 cm 2.04 cm/m   RA Area:     17.60 cm LA Vol (A4C): 53.1 ml 28.45 ml/m  RA Volume:   41.80 ml  22.40 ml/m  AORTIC VALVE                    PULMONIC VALVE AV Vmax:           201.33 cm/s  PV Vmax:       1.32 m/s AV Vmean:          117.667 cm/s PV Peak grad:  7.0 mmHg AV VTI:            0.255 m AV Peak Grad:      16.2 mmHg AV Mean Grad:      6.7 mmHg LVOT Vmax:         90.62 cm/s LVOT Vmean:        58.950 cm/s LVOT VTI:          0.128 m LVOT/AV VTI ratio: 0.50  AORTA Ao Root diam: 3.60 cm  SHUNTS Systemic VTI: 0.13 m Dorris Carnes MD Electronically signed by Dorris Carnes MD Signature Date/Time: 07/18/2021/7:57:11 PM    Final    ECHOCARDIOGRAM LIMITED  Result Date: 07/19/2021    ECHOCARDIOGRAM LIMITED REPORT   Patient Name:   Justin Trujillo. Date of Exam: 07/19/2021 Medical Rec #:  [id number]          Height:       66.0 in Accession #:    [id number]         Weight:       170.0 lb Date of Birth:  1946/02/08          BSA:          1.866 m Patient Age:    65 years           BP:           122/73 mmHg Patient Gender: M                  HR:  108 bpm. Exam Location:  Inpatient Procedure: Limited Echo and Intracardiac Opacification Agent Indications:    Assess LVF w/ Definity  History:        Patient has prior  history of Echocardiogram examinations, most                 recent 07/18/2021. CAD, COPD, Signs/Symptoms:Dyspnea and Chest                 Pain; Risk Factors:Diabetes, Hypertension and Dyslipidemia.  Sonographer:    Melissa Morford RDCS (AE, PE) Referring Phys: [place]  1. LV function is hyperdynamic EF 65-70%; Normal basal and mid segments. However the apex is akinetic indicating a possible stress cardiomyopathy vs. distal LAD disease. No LV thrombus. Recommend EKG and serial troponin levels. FINDINGS  Left Ventricle: LV function is hyperdynamic EF 65-70%; Normal basal and mid segments. However the apex is akinetic indicating a possible stress cardiomyopathy vs. distal LAD disease. No LV thrombus. Recommend EKG and serial troponin levels. Phineas Inches Electronically signed by Phineas Inches Signature Date/Time: 07/19/2021/2:18:03 PM    Final      LOS: 4 days   Oren Binet, MD  [facility]    To contact the attending provider between 7A-7P or the covering provider during after hours 7P-7A, please log into the web site [email or web address] and access using universal [place] password for that web site. If you do not have the password, please call the hospital operator.  07/20/2021, 2:09 PM

## 2021-07-20 NOTE — Progress Notes (Signed)
ANTICOAGULATION CONSULT NOTE - Initial Consult  Pharmacy Consult:  Heparin Indication: atrial fibrillation  Allergies  Allergen Reactions   Iodinated Diagnostic Agents Anaphylaxis   Moxifloxacin Swelling    REACTION: GI upset, throat "tightened up"   Penicillins Anaphylaxis    Did it involve swelling of the face/tongue/throat, SOB, or low BP? Yes Did it involve sudden or severe rash/hives, skin peeling, or any reaction on the inside of your mouth or nose? No Did you need to seek medical attention at a hospital or doctor's office? Yes When did it last happen?   been a while     If all above answers are "NO", may proceed with cephalosporin use.    Shellfish-Derived Products Anaphylaxis   Amoxicillin Other (See Comments)   Chlorhexidine     Port not accessed   Other Other (See Comments)   Latex Rash    Over long periods of time    Patient Measurements: Height: 5\' 6"  (167.6 cm) Weight: 77.1 kg (170 lb) IBW/kg (Calculated) : 63.8 Heparin Dosing Weight: 77 kg  Vital Signs: Temp: 97.9 F (36.6 C) (11/03 0355) Temp Source: Oral (11/03 0355) BP: 111/79 (11/03 0355) Pulse Rate: 91 (11/03 0355)  Labs: Recent Labs    07/18/21 0229 07/19/21 0318 07/19/21 1936 07/20/21 0259  HGB 11.5* 11.3*  --  11.1*  HCT 33.0* 33.5*  --  33.1*  PLT 78* 81*  --  85*  HEPARINUNFRC  --   --  <0.10* 0.17*  CREATININE 0.76 0.80  --  0.81     Estimated Creatinine Clearance: 75.8 mL/min (by C-G formula based on SCr of 0.81 mg/dL).   Medical History: Past Medical History:  Diagnosis Date   Arthritis    Cancer ([place])    Coronary atherosclerosis of native coronary artery    Depression    Esophageal cancer (HCC)    Essential tremor    High cholesterol    Hypertension    Hypertension    Hypogonadism male    Obstructive chronic bronchitis with exacerbation (HCC)    Obstructive sleep apnea (adult) (pediatric)    no cpap   Other and unspecified hyperlipidemia    Other emphysema ([place])     Proteinuria 2019   has seen a nephrologist   Shortness of breath    with excertion   Situational stress    Type II or unspecified type diabetes mellitus without mention of complication, not stated as uncontrolled    type 2   Unspecified essential hypertension      Assessment: 37 YOM with metastatic esophageal cancer admitted with intractable nausea and vomiting.  Now with new Afib and Pharmacy consulted to dose IV heparin.  Cards aware of thrombocytopenia; no bleeding observed.   Heparin level undetectable (on 1000 units/hr; level drawn after only 3.5 hours on drip) No signs/symptoms of bleed  11/3 AM update:  Heparin level sub-therapeutic  Hgb/Plts low but stable  Goal of Therapy:  Heparin level 0.3-0.7 units/ml Monitor platelets by anticoagulation protocol: Yes   Plan:  -Inc heparin to 1200 units/hr -1200 heparin level -Monitor closely for signs/symptoms of bleed with current thrombocytopenia  Narda Bonds, PharmD, BCPS Clinical Pharmacist Phone: [phone]

## 2021-07-21 ENCOUNTER — Encounter: Payer: Self-pay | Admitting: Hematology and Oncology

## 2021-07-21 ENCOUNTER — Other Ambulatory Visit (HOSPITAL_COMMUNITY): Payer: Self-pay

## 2021-07-21 ENCOUNTER — Other Ambulatory Visit: Payer: Self-pay

## 2021-07-21 DIAGNOSIS — I5031 Acute diastolic (congestive) heart failure: Secondary | ICD-10-CM

## 2021-07-21 DIAGNOSIS — I4819 Other persistent atrial fibrillation: Secondary | ICD-10-CM | POA: Diagnosis not present

## 2021-07-21 DIAGNOSIS — R112 Nausea with vomiting, unspecified: Secondary | ICD-10-CM | POA: Diagnosis not present

## 2021-07-21 DIAGNOSIS — E1169 Type 2 diabetes mellitus with other specified complication: Secondary | ICD-10-CM | POA: Diagnosis not present

## 2021-07-21 DIAGNOSIS — N179 Acute kidney failure, unspecified: Secondary | ICD-10-CM | POA: Diagnosis not present

## 2021-07-21 DIAGNOSIS — K219 Gastro-esophageal reflux disease without esophagitis: Secondary | ICD-10-CM | POA: Diagnosis not present

## 2021-07-21 DIAGNOSIS — I2510 Atherosclerotic heart disease of native coronary artery without angina pectoris: Secondary | ICD-10-CM | POA: Diagnosis not present

## 2021-07-21 LAB — HEPATIC FUNCTION PANEL
ALT: 22 U/L (ref 0–44)
AST: 27 U/L (ref 15–41)
Albumin: 2 g/dL — ABNORMAL LOW (ref 3.5–5.0)
Alkaline Phosphatase: 89 U/L (ref 38–126)
Bilirubin, Direct: 0.2 mg/dL (ref 0.0–0.2)
Indirect Bilirubin: 0.4 mg/dL (ref 0.3–0.9)
Total Bilirubin: 0.6 mg/dL (ref 0.3–1.2)
Total Protein: 4.3 g/dL — ABNORMAL LOW (ref 6.5–8.1)

## 2021-07-21 LAB — CBC
HCT: 32.5 % — ABNORMAL LOW (ref 39.0–52.0)
Hemoglobin: 11 g/dL — ABNORMAL LOW (ref 13.0–17.0)
MCH: 32.3 pg (ref 26.0–34.0)
MCHC: 33.8 g/dL (ref 30.0–36.0)
MCV: 95.3 fL (ref 80.0–100.0)
Platelets: 98 10*3/uL — ABNORMAL LOW (ref 150–400)
RBC: 3.41 MIL/uL — ABNORMAL LOW (ref 4.22–5.81)
RDW: 21.1 % — ABNORMAL HIGH (ref 11.5–15.5)
WBC: 25.8 10*3/uL — ABNORMAL HIGH (ref 4.0–10.5)
nRBC: 0.2 % (ref 0.0–0.2)

## 2021-07-21 LAB — BASIC METABOLIC PANEL WITH GFR
Anion gap: 7 (ref 5–15)
BUN: 8 mg/dL (ref 8–23)
CO2: 24 mmol/L (ref 22–32)
Calcium: 8 mg/dL — ABNORMAL LOW (ref 8.9–10.3)
Chloride: 100 mmol/L (ref 98–111)
Creatinine, Ser: 0.73 mg/dL (ref 0.61–1.24)
GFR, Estimated: >60 mL/min (ref >60)
Glucose, Bld: 90 mg/dL (ref 70–99)
Potassium: 3.3 mmol/L — ABNORMAL LOW (ref 3.5–5.1)
Sodium: 131 mmol/L — ABNORMAL LOW (ref 135–145)

## 2021-07-21 LAB — TSH: TSH: 1.661 u[IU]/mL (ref 0.350–4.500)

## 2021-07-21 LAB — GLUCOSE, CAPILLARY
Glucose-Capillary: 100 mg/dL — ABNORMAL HIGH (ref 70–99)
Glucose-Capillary: 146 mg/dL — ABNORMAL HIGH (ref 70–99)

## 2021-07-21 LAB — MAGNESIUM: Magnesium: 1.5 mg/dL — ABNORMAL LOW (ref 1.7–2.4)

## 2021-07-21 MED ORDER — AMIODARONE HCL 200 MG PO TABS
400.0000 mg | ORAL_TABLET | Freq: Two times a day (BID) | ORAL | Status: DC
  Administered 2021-07-21: 400 mg via ORAL
  Filled 2021-07-21: qty 2

## 2021-07-21 MED ORDER — POTASSIUM CHLORIDE CRYS ER 20 MEQ PO TBCR
40.0000 meq | EXTENDED_RELEASE_TABLET | Freq: Every day | ORAL | 1 refills | Status: DC
  Filled 2021-07-21: qty 60, 30d supply, fill #0

## 2021-07-21 MED ORDER — AMIODARONE HCL 200 MG PO TABS
400.0000 mg | ORAL_TABLET | Freq: Two times a day (BID) | ORAL | 1 refills | Status: DC
  Filled 2021-07-21: qty 120, 30d supply, fill #0

## 2021-07-21 MED ORDER — PANTOPRAZOLE SODIUM 40 MG PO TBEC
40.0000 mg | DELAYED_RELEASE_TABLET | Freq: Two times a day (BID) | ORAL | 2 refills | Status: DC
  Filled 2021-07-21: qty 60, 30d supply, fill #0

## 2021-07-21 MED ORDER — ONDANSETRON 4 MG PO TBDP
4.0000 mg | ORAL_TABLET | Freq: Three times a day (TID) | ORAL | 0 refills | Status: AC | PRN
  Filled 2021-07-21: qty 20, 7d supply, fill #0

## 2021-07-21 MED ORDER — POTASSIUM CHLORIDE CRYS ER 20 MEQ PO TBCR
40.0000 meq | EXTENDED_RELEASE_TABLET | ORAL | Status: AC
  Administered 2021-07-21 (×2): 40 meq via ORAL
  Filled 2021-07-21 (×2): qty 2

## 2021-07-21 MED ORDER — MAGNESIUM OXIDE 400 MG PO TABS
400.0000 mg | ORAL_TABLET | Freq: Every day | ORAL | 0 refills | Status: DC
  Filled 2021-07-21: qty 14, 14d supply, fill #0

## 2021-07-21 MED ORDER — ONDANSETRON 4 MG PO TBDP
4.0000 mg | ORAL_TABLET | Freq: Three times a day (TID) | ORAL | 0 refills | Status: DC | PRN
  Filled 2021-07-21: qty 20, 7d supply, fill #0

## 2021-07-21 MED ORDER — FUROSEMIDE 40 MG PO TABS
40.0000 mg | ORAL_TABLET | Freq: Every day | ORAL | 1 refills | Status: DC
  Filled 2021-07-21: qty 30, 30d supply, fill #0

## 2021-07-21 MED ORDER — AMIODARONE HCL 200 MG PO TABS
400.0000 mg | ORAL_TABLET | Freq: Two times a day (BID) | ORAL | 1 refills | Status: DC
  Filled 2021-07-21: qty 74, 30d supply, fill #0

## 2021-07-21 MED ORDER — FUROSEMIDE 10 MG/ML IJ SOLN
40.0000 mg | Freq: Once | INTRAMUSCULAR | Status: AC
  Administered 2021-07-21: 40 mg via INTRAVENOUS
  Filled 2021-07-21: qty 4

## 2021-07-21 MED ORDER — HEPARIN SOD (PORK) LOCK FLUSH 100 UNIT/ML IV SOLN
500.0000 [IU] | INTRAVENOUS | Status: AC | PRN
  Administered 2021-07-21: 500 [IU]
  Filled 2021-07-21: qty 5

## 2021-07-21 MED ORDER — SUCRALFATE 1 GM/10ML PO SUSP
1.0000 g | Freq: Three times a day (TID) | ORAL | 2 refills | Status: AC
  Filled 2021-07-21: qty 420, 11d supply, fill #0

## 2021-07-21 MED ORDER — MAGNESIUM SULFATE 4 GM/100ML IV SOLN
4.0000 g | Freq: Once | INTRAVENOUS | Status: AC
  Administered 2021-07-21: 4 g via INTRAVENOUS
  Filled 2021-07-21: qty 100

## 2021-07-21 MED ORDER — APIXABAN 5 MG PO TABS
5.0000 mg | ORAL_TABLET | Freq: Two times a day (BID) | ORAL | 2 refills | Status: DC
  Filled 2021-07-21: qty 60, 30d supply, fill #0

## 2021-07-21 NOTE — Progress Notes (Addendum)
PATIENT DETAILS Name: Justin Trujillo. Age: 75 y.o. Sex: male Date of Birth: [birth year] MRN: [id number]. Admitting Physician: Orma Flaming, MD HER:DEYCX, Hal Hope, MD  Admit Date: 07/15/2021 Discharge date: 07/21/2021  Recommendations for Outpatient Follow-up:  Follow up with PCP in 1-2 weeks Please obtain CMP/CBC in one week Please ensure follow-up with oncology and cardiology.  Admitted From:  Home  Disposition: [place]: No  Equipment/Devices: None  Discharge Condition: Stable  CODE STATUS: FULL CODE  Diet recommendation:  Diet Order             Diet - low sodium heart healthy           DIET DYS 3 Room service appropriate? Yes; Fluid consistency: Thin; Fluid restriction: 1500 mL Fluid  Diet effective now                    Brief Summary: Patient is a 75 y.o. male with history of metastatic esophageal cancer-s/p palliative radiation-on chemotherapy-presented to the hospital with recurrent nausea/vomiting.  Hospital course complicated by development of A. fib with RVR.  See below for further details.   Pertinent Labs/Radiology: 10/27>> CT chest/abdomen/pelvis: Soft tissue thickening in the distal esophagus-no suspicious findings of metastatic disease. 10/27>> CT soft tissue neck: No new adenopathy-no acute abnormalities.   10/31>> barium esophagogram.  No esophageal stricture. 11/1>> Echo: EF appears mild to moderately depressed 11/2>> Limited echo: EF 65-70% apex is akinetic.  Brief Hospital Course: Intractable nausea/vomiting: Due to underlying malignancy/recent chemotherapy.  Barium esophagogram negative for strictures.  CT abdomen negative for SBO.  Treated with antiemetics-briefly on scheduled Zofran-but has been changed to as needed dosing.  Patient has been requesting discharge for the past 2-3 days-is again just requesting discharge today-since he is tolerating a regular diet-I suspect he is as stable as 1 could be to go home.    Epigastric pain: Due to reflux/nausea/vomiting/distal esophageal adenocarcinoma-continues to have intermittent epigastric pain/dyspepsia like symptoms-continue PPI and Carafate.  May need to add H2 blocker if symptoms continue.     Hyponatremia: Initially felt to be due to dehydration-and sodium levels that improved with IVF.  However he is now has some lower extremity edema-hyponatremia lately is felt to be from volume overload.  He was started on Lasix-sodium levels have stabilized and actually increasing.  Please recheck sodium levels at next follow-up.   Hypokalemia: Due to GI loss-repleted.   New onset PAF with RVR: Continues to be in A. fib/flutter-rate better controlled with amiodarone.  Was on amiodarone infusion-has been transitioned to oral amiodarone today.  Remains on Eliquis.  Echo as above.  Discussed cardiology-okay to discharge-cardiology will arrange for outpatient follow-up.    HFpEF with mild exacerbation: Has mild lower extremity edema-but denies any shortness of breath to me this morning.  He has been started on Lasix since 11/3-cardiology planning to give him a dose of IV Lasix today-following which will be observed-if he remains stable-he will be discharged home on diuretics (has been very anxious to go home for the past several days).  Blood pressure too soft to add additional medications including beta-blocker-this can be reassessed in the outpatient setting.   Pancytopenia: Likely sequelae of chemotherapy-improving-no longer on Granix.   Distal esophageal adenocarcinoma: S/p palliative radiation completed on 7/20-last cycle of chemotherapy on 10/19.  Oncology following.   DM-2 (A1c 5.9): CBG stable with SSI-resume usual outpatient diabetic regimen on discharge.  BPH: Continue Flomax   Essential tremor: Continue primidone/Requip  Peripheral neuropathy: Likely due to diabetes-continue Neurontin.   Depression/anxiety: Relatively stable-continue Lexapro and as needed  Xanax.   Tobacco: Continue transdermal nicotine-counseled regarding importance of quitting.   BMI Estimated body mass index is 27.44 kg/m as calculated from the following:   Height as of this encounter: 5\' 6"  (1.676 m).   Weight as of this encounter: 77.1 kg.    Procedures None  Discharge Diagnoses:  Active Problems:   OBSTRUCTIVE SLEEP APNEA   DM type 2 (diabetes mellitus, type 2) (HCC)   CAD (coronary artery disease)   HTN (hypertension)   Dyslipidemia   GERD (gastroesophageal reflux disease)   Malignant neoplasm of lower third of esophagus (HCC)   AKI (acute kidney injury) (HCC)   Intractable nausea and vomiting   Dehydration   Metabolic acidosis, increased anion gap   Chemotherapy induced nausea and vomiting   Vomiting without nausea   Discharge Instructions:  Activity:  As tolerated with Full fall precautions use walker/cane & assistance as needed   Discharge Instructions     (HEART FAILURE PATIENTS) Call MD:  Anytime you have any of the following symptoms: 1) 3 pound weight gain in 24 hours or 5 pounds in 1 week 2) shortness of breath, with or without a dry hacking cough 3) swelling in the hands, feet or stomach 4) if you have to sleep on extra pillows at night in order to breathe.   Complete by: As directed    Call MD for:  difficulty breathing, headache or visual disturbances   Complete by: As directed    Diet - low sodium heart healthy   Complete by: As directed    Discharge instructions   Complete by: As directed    Follow with Primary MD  Carol Ada, MD in 1-2 weeks  Please get a complete blood count and chemistry panel checked by your Primary MD at your next visit, and again as instructed by your Primary MD.  Get Medicines reviewed and adjusted: Please take all your medications with you for your next visit with your Primary MD  Laboratory/radiological data: Please request your Primary MD to go over all hospital tests and procedure/radiological  results at the follow up, please ask your Primary MD to get all Hospital records sent to his/her office.  In some cases, they will be blood work, cultures and biopsy results pending at the time of your discharge. Please request that your primary care M.D. follows up on these results.  Also Note the following: If you experience worsening of your admission symptoms, develop shortness of breath, life threatening emergency, suicidal or homicidal thoughts you must seek medical attention immediately by calling 911 or calling your MD immediately  if symptoms less severe.  You must read complete instructions/literature along with all the possible adverse reactions/side effects for all the Medicines you take and that have been prescribed to you. Take any new Medicines after you have completely understood and accpet all the possible adverse reactions/side effects.   Do not drive when taking Pain medications or sleeping medications (Benzodaizepines)  Do not take more than prescribed Pain, Sleep and Anxiety Medications. It is not advisable to combine anxiety,sleep and pain medications without talking with your primary care practitioner  Special Instructions: If you have smoked or chewed Tobacco  in the last 2 yrs please stop smoking, stop any regular Alcohol  and or any Recreational drug use.  Wear Seat belts while driving.  Please note: You were cared for by a hospitalist during your  hospital stay. Once you are discharged, your primary care physician will handle any further medical issues. Please note that NO REFILLS for any discharge medications will be authorized once you are discharged, as it is imperative that you return to your primary care physician (or establish a relationship with a primary care physician if you do not have one) for your post hospital discharge needs so that they can reassess your need for medications and monitor your lab values.   Increase activity slowly   Complete by: As  directed       Allergies as of 07/21/2021       Reactions   Iodinated Diagnostic Agents Anaphylaxis   Moxifloxacin Swelling   REACTION: GI upset, throat "tightened up"   Penicillins Anaphylaxis   Did it involve swelling of the face/tongue/throat, SOB, or low BP? Yes Did it involve sudden or severe rash/hives, skin peeling, or any reaction on the inside of your mouth or nose? No Did you need to seek medical attention at a hospital or doctor's office? Yes When did it last happen?   been a while     If all above answers are "NO", may proceed with cephalosporin use.   Shellfish-derived Products Anaphylaxis   Amoxicillin Other (See Comments)   Chlorhexidine    Port not accessed   Other Other (See Comments)   Latex Rash   Over long periods of time        Medication List     STOP taking these medications    lisinopril 5 MG tablet Commonly known as: ZESTRIL   ondansetron 8 MG tablet Commonly known as: ZOFRAN   sulfamethoxazole-trimethoprim 800-160 MG tablet Commonly known as: BACTRIM DS       TAKE these medications    acetaminophen 500 MG tablet Commonly known as: TYLENOL Take 650 mg by mouth every 8 (eight) hours as needed for mild pain (pain).   albuterol 108 (90 Base) MCG/ACT inhaler Commonly known as: VENTOLIN HFA Inhale 1 puff into the lungs every 6 (six) hours as needed for wheezing.   ALPRAZolam 0.5 MG tablet Commonly known as: XANAX Take 0.5 mg by mouth 2 (two) times daily as needed for anxiety.   amiodarone 200 MG tablet Commonly known as: PACERONE Take 2 tablets (400 mg total) by mouth 2 (two) times daily. Take 400 twice daily x1 week, and then switch to 200 mg twice daily   apixaban 5 MG Tabs tablet Commonly known as: ELIQUIS Take 1 tablet (5 mg total) by mouth 2 (two) times daily.   atorvastatin 40 MG tablet Commonly known as: LIPITOR Take 1 tablet (40 mg total) by mouth daily.   CALCIUM 600+D3 PO Take 1 tablet by mouth daily.   Centrum  Silver tablet daily in the afternoon.   escitalopram 10 MG tablet Commonly known as: LEXAPRO Take 10 mg by mouth daily.   furosemide 40 MG tablet Commonly known as: Lasix Take 1 tablet (40 mg total) by mouth daily. Start taking on: July 22, 2021   gabapentin 300 MG capsule Commonly known as: NEURONTIN Take 2 capsules (600 mg total) by mouth 3 (three) times daily. What changed: how much to take   lidocaine-prilocaine cream Commonly known as: EMLA Apply 1 application topically as needed.   magnesium oxide 400 MG tablet Commonly known as: MAG-OX Take 1 tablet (400 mg total) by mouth daily.   Myrbetriq 50 MG Tb24 tablet Generic drug: mirabegron ER Take 50 mg by mouth daily.   ondansetron 4 MG disintegrating  tablet Commonly known as: Zofran ODT Take 1 tablet (4 mg total) by mouth every 8 (eight) hours as needed for nausea or vomiting.   oxyCODONE-acetaminophen 10-325 MG tablet Commonly known as: PERCOCET Take 1 tablet by mouth every 6 (six) hours as needed.   Ozempic (0.25 or 0.5 MG/DOSE) 2 MG/1.5ML Sopn Generic drug: Semaglutide(0.25 or 0.5MG /DOS) Ozempic 0.25 mg or 0.5 mg (2 mg/1.5 mL) subcutaneous pen injector  INJECT 0.5 MG SUBCUTANEOUSLY ONCE WEEKLY   pantoprazole 40 MG tablet Commonly known as: PROTONIX Take 1 tablet (40 mg total) by mouth 2 (two) times daily.   potassium chloride SA 20 MEQ tablet Commonly known as: KLOR-CON Take 2 tablets (40 mEq total) by mouth daily.   predniSONE 50 MG tablet Commonly known as: DELTASONE Take 1 tablet 13 hours, 7 hours and 1 hour prior to CT Scan   primidone 50 MG tablet Commonly known as: MYSOLINE Take 1 tablet (50 mg total) by mouth in the morning and at bedtime.   prochlorperazine 10 MG tablet Commonly known as: COMPAZINE Take 1 tablet (10 mg total) by mouth every 6 (six) hours as needed for nausea or vomiting.   rOPINIRole 0.5 MG tablet Commonly known as: REQUIP Take 0.5 mg by mouth at bedtime.   sucralfate  1 GM/10ML suspension Commonly known as: CARAFATE Take 10 mLs (1 g total) by mouth 4 (four) times daily -  with meals and at bedtime.   tamsulosin 0.4 MG Caps capsule Commonly known as: FLOMAX Take 0.4 mg by mouth daily.   TESTOSTERONE IM Inject 1 mL into the muscle every 14 (fourteen) days.   tiZANidine 4 MG tablet Commonly known as: ZANAFLEX Take 4 mg by mouth 3 (three) times daily as needed.   trolamine salicylate 10 % cream Commonly known as: ASPERCREME Apply 1 application topically 2 (two) times daily as needed for muscle pain.   Vitamin D 50 MCG (2000 UT) tablet Take 2,000 Units by mouth daily.        Follow-up Information     Evans Lance, MD Follow up on 08/01/2021.   Specialty: Cardiology Why: at 9:30am for your follow up appt. Please arrive 15 prior. Contact information: [address] [place] [address] [phone]         Carol Ada, MD. Schedule an appointment as soon as possible for a visit in 1 week(s).   Specialty: Family Medicine Contact information: [place] [address] [phone]         Orson Slick, MD Follow up.   Specialty: Hematology and Oncology Why: Hospital follow up, Office will call with date/time, If you dont hear from them,please give them a call, Repeat electrolytes Contact information: [address] [name] [address] [phone]                Allergies  Allergen Reactions   Iodinated Diagnostic Agents Anaphylaxis   Moxifloxacin Swelling    REACTION: GI upset, throat "tightened up"   Penicillins Anaphylaxis    Did it involve swelling of the face/tongue/throat, SOB, or low BP? Yes Did it involve sudden or severe rash/hives, skin peeling, or any reaction on the inside of your mouth or nose? No Did you need to seek medical attention at a hospital or doctor's office? Yes When did it last happen?   been a while     If all above answers are  "NO", may proceed with cephalosporin use.    Shellfish-Derived  Products Anaphylaxis   Amoxicillin Other (See Comments)   Chlorhexidine     Port not accessed   Other Other (See Comments)   Latex Rash    Over long periods of time      Consultations: GI, cardiology, oncology   Other Procedures/Studies: CT Soft Tissue Neck W Contrast  Result Date: 07/16/2021 CLINICAL DATA:  Esophageal cancer EXAM: CT NECK WITH CONTRAST TECHNIQUE: Multidetector CT imaging of the neck was performed using the standard protocol following the bolus administration of intravenous contrast. CONTRAST:  40mL OMNIPAQUE IOHEXOL 350 MG/ML SOLN COMPARISON:  PET-CT 02/22/2021 FINDINGS: Pharynx and larynx: Normal. No mass or swelling. Salivary glands: No inflammation, mass, or stone. Thyroid: Negative Lymph nodes: No enlarged or pathologic lymph nodes. Hypermetabolic small lymph node lateral to the proximal sternocleidomastoid muscle on the right has improved since the prior PET CT. No new adenopathy. Vascular: Significant atherosclerotic disease in the carotid bifurcation bilaterally with carotid stenosis bilaterally. Normal venous enhancement. Limited intracranial: No acute abnormality Visualized orbits: Negative Mastoids and visualized paranasal sinuses: Mild mucosal edema maxillary sinus bilaterally. Remaining sinuses clear. Skeleton: Cervical spondylosis. No acute skeletal abnormality. 1 cm lesion in the angle the mandible on the right inferiorly. Central sclerosis with surrounding lucency. No aggressive bony destruction. This was not hypermetabolic on prior PET. Upper chest: Lung apices clear bilaterally. Other: None IMPRESSION: 1. Regression of small hypermetabolic lymph node in the right upper neck overlying the right sternocleidomastoid muscle. No new adenopathy 2. Nonaggressive appearing lesion in the right angle the mandible which appears to be an incidental benign finding of uncertain etiology. Electronically Signed    By: Franchot Gallo M.D.   On: 07/16/2021 12:26   CT CHEST ABDOMEN PELVIS W CONTRAST  Result Date: 07/15/2021 CLINICAL DATA:  Esophageal cancer, assess treatment response EXAM: CT CHEST, ABDOMEN, AND PELVIS WITH CONTRAST TECHNIQUE: Multidetector CT imaging of the chest, abdomen and pelvis was performed following the standard protocol during bolus administration of intravenous contrast. CONTRAST:  55mL OMNIPAQUE IOHEXOL 350 MG/ML SOLN COMPARISON:  PET-CT dated 02/22/2021 FINDINGS: CT CHEST FINDINGS Cardiovascular: The heart is normal in size. No pericardial effusion. No evidence of thoracic aortic aneurysm. Atherosclerotic calcifications of the aortic arch. Three vessel coronary atherosclerosis. Right chest port terminates in the lower SVC. Mediastinum/Nodes: No suspicious mediastinal lymphadenopathy. Stable residual soft tissue thickening along the left posterolateral distal esophagus (series 3/image 46), measuring up to 11 mm in thickness, compatible with the patient's known esophageal cancer. Visualized thyroid is unremarkable. Lungs/Pleura: Mild subpleural reticulation/fibrosis in the lungs bilaterally, mid/lower lung predominant, suggesting mild chronic interstitial lung disease. No suspicious pulmonary nodules. No focal consolidation. No pleural effusion or pneumothorax. Musculoskeletal: Bilateral shoulder arthroplasties. Mild degenerative changes of the lower thoracic spine. CT ABDOMEN PELVIS FINDINGS Hepatobiliary: Liver is within normal limits. No suspicious/enhancing hepatic lesions. Gallbladder is unremarkable. No intrahepatic or extrahepatic ductal dilatation. Pancreas: Within normal limits. Spleen: Within normal limits. Adrenals/Urinary Tract: Adrenal glands are within normal limits. 8 mm posterior interpolar right renal cyst (series 3/image 69). Additional subcentimeter cysts bilaterally. No hydronephrosis. Bladder is within normal limits. Stomach/Bowel: Stomach is within normal limits. No evidence  of bowel obstruction. Normal appendix (series 3/image 80). Mild left colonic diverticulosis, without evidence of diverticulitis. Vascular/Lymphatic: No evidence of abdominal aortic aneurysm. Atherosclerotic calcifications of the abdominal aorta and branch vessels. No suspicious abdominopelvic lymphadenopathy. Specifically, no gastrohepatic/celiac axis lymphadenopathy. Reproductive: Prostate is unremarkable. Other: No abdominopelvic ascites. Musculoskeletal: Status post PLIF at L3-4. Probable residual mentation at L4. Moderate multilevel degenerative changes.  IMPRESSION: Stable soft tissue thickening along the left posterolateral distal esophagus, corresponding to the patient's known esophageal cancer. No findings suspicious for metastatic disease. Additional ancillary findings as above. Electronically Signed   By: Julian Hy M.D.   On: 07/15/2021 20:01   ECHOCARDIOGRAM COMPLETE  Result Date: 07/18/2021    ECHOCARDIOGRAM REPORT   Patient Name:   Justin Trujillo. Date of Exam: 07/18/2021 Medical Rec #:  [id number]          Height:       66.0 in Accession #:    [id number]         Weight:       170.0 lb Date of Birth:  1946/07/17          BSA:          1.866 m Patient Age:    80 years           BP:           127/79 mmHg Patient Gender: M                  HR:           114 bpm. Exam Location:  Inpatient Procedure: 2D Echo, Cardiac Doppler and Color Doppler Indications:    I48.1 Persistent atrial fibrillation  History:        Patient has no prior history of Echocardiogram examinations.                 CAD, COPD, Signs/Symptoms:Dyspnea and Chest Pain; Risk                 Factors:Diabetes and Dyslipidemia.  Sonographer:    Glo Herring Referring Phys: [place]  1. LVEF appears at least mild to moderately depressed with hypokinesis of the distal half of the LV WOuld recomm Limited echo with Definity to confirm LVEF and wall motion. . The left ventricle has no regional wall motion  abnormalities. Left ventricular  diastolic parameters are indeterminate.  2. Right ventricular systolic function is low normal. The right ventricular size is normal.  3. Trivial mitral valve regurgitation. Moderate mitral annular calcification.  4. Aortic valve regurgitation is not visualized. Mild to moderate aortic valve sclerosis/calcification is present, without any evidence of aortic stenosis.  5. The inferior vena cava is normal in size with greater than 50% respiratory variability, suggesting right atrial pressure of 3 mmHg. FINDINGS  Left Ventricle: LVEF appears at least mild to moderately depressed with hypokinesis of the distal half of the LV WOuld recomm Limited echo with Definity to confirm LVEF and wall motion. The left ventricle has no regional wall motion abnormalities. The left ventricular internal cavity size was normal in size. There is no left ventricular hypertrophy. Left ventricular diastolic parameters are indeterminate. Right Ventricle: The right ventricular size is normal. Right vetricular wall thickness was not assessed. Right ventricular systolic function is low normal. Left Atrium: Left atrial size was normal in size. Right Atrium: Right atrial size was normal in size. Pericardium: There is no evidence of pericardial effusion. Mitral Valve: There is mild thickening of the mitral valve leaflet(s). Moderate mitral annular calcification. Trivial mitral valve regurgitation. Tricuspid Valve: The tricuspid valve is normal in structure. Tricuspid valve regurgitation is trivial. Aortic Valve: Aortic valve regurgitation is not visualized. Mild to moderate aortic valve sclerosis/calcification is present, without any evidence of aortic stenosis. Aortic valve mean gradient measures 6.7 mmHg. Aortic valve peak gradient measures 16.2 mmHg. Pulmonic Valve:  The pulmonic valve was not well visualized. Pulmonic valve regurgitation is not visualized. No evidence of pulmonic stenosis. Aorta: The aortic root  is normal in size and structure. Venous: The inferior vena cava is normal in size with greater than 50% respiratory variability, suggesting right atrial pressure of 3 mmHg. IAS/Shunts: No atrial level shunt detected by color flow Doppler.  LEFT VENTRICLE PLAX 2D LVIDd:         3.50 cm LVIDs:         2.50 cm LV PW:         1.10 cm LV IVS:        1.00 cm  RIGHT VENTRICLE          IVC RV Basal diam:  3.90 cm  IVC diam: 1.80 cm LEFT ATRIUM           Index        RIGHT ATRIUM           Index LA diam:      3.80 cm 2.04 cm/m   RA Area:     17.60 cm LA Vol (A4C): 53.1 ml 28.45 ml/m  RA Volume:   41.80 ml  22.40 ml/m  AORTIC VALVE                    PULMONIC VALVE AV Vmax:           201.33 cm/s  PV Vmax:       1.32 m/s AV Vmean:          117.667 cm/s PV Peak grad:  7.0 mmHg AV VTI:            0.255 m AV Peak Grad:      16.2 mmHg AV Mean Grad:      6.7 mmHg LVOT Vmax:         90.62 cm/s LVOT Vmean:        58.950 cm/s LVOT VTI:          0.128 m LVOT/AV VTI ratio: 0.50  AORTA Ao Root diam: 3.60 cm  SHUNTS Systemic VTI: 0.13 m Dorris Carnes MD Electronically signed by Dorris Carnes MD Signature Date/Time: 07/18/2021/7:57:11 PM    Final    ECHOCARDIOGRAM LIMITED  Result Date: 07/19/2021    ECHOCARDIOGRAM LIMITED REPORT   Patient Name:   Justin Trujillo. Date of Exam: 07/19/2021 Medical Rec #:  [id number]          Height:       66.0 in Accession #:    [id number]         Weight:       170.0 lb Date of Birth:  05-12-1946          BSA:          1.866 m Patient Age:    34 years           BP:           122/73 mmHg Patient Gender: M                  HR:           108 bpm. Exam Location:  Inpatient Procedure: Limited Echo and Intracardiac Opacification Agent Indications:    Assess LVF w/ Definity  History:        Patient has prior history of Echocardiogram examinations, most                 recent 07/18/2021. CAD, COPD, Signs/Symptoms:Dyspnea  and Chest                 Pain; Risk Factors:Diabetes, Hypertension and Dyslipidemia.   Sonographer:    Melissa Morford RDCS (AE, PE) Referring Phys: [place]  1. LV function is hyperdynamic EF 65-70%; Normal basal and mid segments. However the apex is akinetic indicating a possible stress cardiomyopathy vs. distal LAD disease. No LV thrombus. Recommend EKG and serial troponin levels. FINDINGS  Left Ventricle: LV function is hyperdynamic EF 65-70%; Normal basal and mid segments. However the apex is akinetic indicating a possible stress cardiomyopathy vs. distal LAD disease. No LV thrombus. Recommend EKG and serial troponin levels. Phineas Inches Electronically signed by Phineas Inches Signature Date/Time: 07/19/2021/2:18:03 PM    Final    DG ESOPHAGUS W SINGLE CM (SOL OR THIN BA)  Result Date: 07/17/2021 CLINICAL DATA:  History of esophageal cancer with nausea and vomiting. EXAM: ESOPHAGUS/BARIUM SWALLOW TECHNIQUE: Single contrast examination was performed using thin liquid barium. This exam was performed by Soyla Dryer, and was supervised and interpreted by Abigail Miyamoto. FLUOROSCOPY TIME:  Radiation Exposure Index (as provided by the fluoroscopic device): Not applicable. If the device does not provide the exposure index: Fluoroscopy Time:  1 minute and 36 seconds Number of Acquired Images:  None COMPARISON:  Chest CT 07/13/2021 FINDINGS: Focused exam was performed with the patient supine and in various obliquities. Focused single-contrast exam performed. Full column evaluation of the esophagus demonstrates no persistent narrowing or stricture. Spontaneous gastroesophageal reflux is identified to the level of the clavicles. A 13 mm barium tablet passes promptly. IMPRESSION: Single-contrast, focused exam performed as detailed above. No esophageal stricture. Spontaneous gastroesophageal reflux. Electronically Signed   By: Abigail Miyamoto M.D.   On: 07/17/2021 09:48     TODAY-DAY OF DISCHARGE:  Subjective:   Justin Trujillo today has no headache,no chest abdominal pain,no new  weakness tingling or numbness, feels much better wants to go home today.   Objective:   Blood pressure 93/76, pulse 96, temperature 97.8 F (36.6 C), temperature source Oral, resp. rate 18, height 5\' 6"  (1.676 m), weight 77.1 kg, SpO2 97 %.  Intake/Output Summary (Last 24 hours) at 07/21/2021 1238 Last data filed at 07/21/2021 1143 Gross per 24 hour  Intake 505.73 ml  Output 1050 ml  Net -544.27 ml   Filed Weights   07/15/21 1057  Weight: 77.1 kg    Exam: Awake Alert, Oriented *3, No new F.N deficits, Normal affect [place].AT,PERRAL Supple Neck,No JVD, No cervical lymphadenopathy appriciated.  Symmetrical Chest wall movement, Good air movement bilaterally, CTAB RRR,No Gallops,Rubs or new Murmurs, No Parasternal Heave +ve B.Sounds, Abd Soft, Non tender, No organomegaly appriciated, No rebound -guarding or rigidity. No Cyanosis, Clubbing or edema, No new Rash or bruise   PERTINENT RADIOLOGIC STUDIES: No results found.   PERTINENT LAB RESULTS: CBC: Recent Labs    07/20/21 0259 07/21/21 0447  WBC 20.0* 25.8*  HGB 11.1* 11.0*  HCT 33.1* 32.5*  PLT 85* 98*   CMET CMP     Component Value Date/Time   NA 131 (L) 07/21/2021 0447   NA 133 (L) 01/10/2021 0959   K 3.3 (L) 07/21/2021 0447   CL 100 07/21/2021 0447   CO2 24 07/21/2021 0447   GLUCOSE 90 07/21/2021 0447   BUN 8 07/21/2021 0447   BUN 16 01/10/2021 0959   CREATININE 0.73 07/21/2021 0447   CREATININE 0.84 07/05/2021 1048   CREATININE 0.76 01/14/2012 0750   CALCIUM 8.0 (L)  07/21/2021 0447   PROT 4.3 (L) 07/21/2021 0447   ALBUMIN 2.0 (L) 07/21/2021 0447   AST 27 07/21/2021 0447   AST 23 07/05/2021 1048   ALT 22 07/21/2021 0447   ALT 14 07/05/2021 1048   ALKPHOS 89 07/21/2021 0447   BILITOT 0.6 07/21/2021 0447   BILITOT 0.4 07/05/2021 1048   GFRNONAA >60 07/21/2021 0447   GFRNONAA >60 07/05/2021 1048   GFRAA >60 03/23/2019 1540    GFR Estimated Creatinine Clearance: 76.8 mL/min (by C-G formula based on SCr  of 0.73 mg/dL). No results for input(s): LIPASE, AMYLASE in the last 72 hours. No results for input(s): CKTOTAL, CKMB, CKMBINDEX, TROPONINI in the last 72 hours. Invalid input(s): POCBNP No results for input(s): DDIMER in the last 72 hours. No results for input(s): HGBA1C in the last 72 hours. No results for input(s): CHOL, HDL, LDLCALC, TRIG, CHOLHDL, LDLDIRECT in the last 72 hours. Recent Labs    07/21/21 0447  TSH 1.661   No results for input(s): VITAMINB12, FOLATE, FERRITIN, TIBC, IRON, RETICCTPCT in the last 72 hours. Coags: No results for input(s): INR in the last 72 hours.  Invalid input(s): PT Microbiology: Recent Results (from the past 240 hour(s))  Resp Panel by RT-PCR (Flu A&B, Covid) Nasopharyngeal Swab     Status: None   Collection Time: 07/15/21 11:16 AM   Specimen: Nasopharyngeal Swab; Nasopharyngeal(NP) swabs in vial transport medium  Result Value Ref Range Status   SARS Coronavirus 2 by RT PCR NEGATIVE NEGATIVE Final    Comment: (NOTE) SARS-CoV-2 target nucleic acids are NOT DETECTED.  The SARS-CoV-2 RNA is generally detectable in upper respiratory specimens during the acute phase of infection. The lowest concentration of SARS-CoV-2 viral copies this assay can detect is 138 copies/mL. A negative result does not preclude SARS-Cov-2 infection and should not be used as the sole basis for treatment or other patient management decisions. A negative result may occur with  improper specimen collection/handling, submission of specimen other than nasopharyngeal swab, presence of viral mutation(s) within the areas targeted by this assay, and inadequate number of viral copies(<138 copies/mL). A negative result must be combined with clinical observations, patient history, and epidemiological information. The expected result is Negative.  Fact Sheet for Patients:  EntrepreneurPulse.com.au  Fact Sheet for Healthcare Providers:   IncredibleEmployment.be  This test is no t yet approved or cleared by the [place] FDA and  has been authorized for detection and/or diagnosis of SARS-CoV-2 by FDA under an Emergency Use Authorization (EUA). This EUA will remain  in effect (meaning this test can be used) for the duration of the COVID-19 declaration under Section 564(b)(1) of the Act, 21 U.S.C.section 360bbb-3(b)(1), unless the authorization is terminated  or revoked sooner.       Influenza A by PCR NEGATIVE NEGATIVE Final   Influenza B by PCR NEGATIVE NEGATIVE Final    Comment: (NOTE) The Xpert Xpress SARS-CoV-2/FLU/RSV plus assay is intended as an aid in the diagnosis of influenza from Nasopharyngeal swab specimens and should not be used as a sole basis for treatment. Nasal washings and aspirates are unacceptable for Xpert Xpress SARS-CoV-2/FLU/RSV testing.  Fact Sheet for Patients: EntrepreneurPulse.com.au  Fact Sheet for Healthcare Providers: IncredibleEmployment.be  This test is not yet approved or cleared by the [place] FDA and has been authorized for detection and/or diagnosis of SARS-CoV-2 by FDA under an Emergency Use Authorization (EUA). This EUA will remain in effect (meaning this test can be used) for the duration of the COVID-19 declaration  under Section 564(b)(1) of the Act, 21 U.S.C. section 360bbb-3(b)(1), unless the authorization is terminated or revoked.  Performed at [facility], [place] [address] [place], [place] [address]     FURTHER DISCHARGE INSTRUCTIONS:  Get Medicines reviewed and adjusted: Please take all your medications with you for your next visit with your Primary MD  Laboratory/radiological data: Please request your Primary MD to go over all hospital tests and procedure/radiological results at the follow up, please ask your Primary MD to get all Hospital records sent to his/her office.  In some cases,  they will be blood work, cultures and biopsy results pending at the time of your discharge. Please request that your primary care M.D. goes through all the records of your hospital data and follows up on these results.  Also Note the following: If you experience worsening of your admission symptoms, develop shortness of breath, life threatening emergency, suicidal or homicidal thoughts you must seek medical attention immediately by calling 911 or calling your MD immediately  if symptoms less severe.  You must read complete instructions/literature along with all the possible adverse reactions/side effects for all the Medicines you take and that have been prescribed to you. Take any new Medicines after you have completely understood and accpet all the possible adverse reactions/side effects.   Do not drive when taking Pain medications or sleeping medications (Benzodaizepines)  Do not take more than prescribed Pain, Sleep and Anxiety Medications. It is not advisable to combine anxiety,sleep and pain medications without talking with your primary care practitioner  Special Instructions: If you have smoked or chewed Tobacco  in the last 2 yrs please stop smoking, stop any regular Alcohol  and or any Recreational drug use.  Wear Seat belts while driving.  Please note: You were cared for by a hospitalist during your hospital stay. Once you are discharged, your primary care physician will handle any further medical issues. Please note that NO REFILLS for any discharge medications will be authorized once you are discharged, as it is imperative that you return to your primary care physician (or establish a relationship with a primary care physician if you do not have one) for your post hospital discharge needs so that they can reassess your need for medications and monitor your lab values.  Total Time spent coordinating discharge including counseling, education and face to face time equals 35  minutes.  SignedOren Binet 07/21/2021 12:38 PM

## 2021-07-21 NOTE — Progress Notes (Addendum)
The patient has been seen in conjunction with Reino Bellis, NP-C. All aspects of care have been considered and discussed. The patient has been personally interviewed, examined, and all clinical data has been reviewed.  States he is always short of breath.  Feels slightly more short of breath and usual.  Does have lower extremity edema.  1 dose of IV Lasix is being given today.  He will be at increased risk for acute diastolic heart failure now that he is in atrial fib.  He is cautioned to notify us if swelling recurs (assuming that Lasix today causes significant diuresis) or worsening shortness of breath at home.  Will need basic metabolic panel on follow-up. Will continue with amiodarone loading and goal of electrical cardioversion in 3 to 4 weeks.  This can be arranged after TOC follow-up in 7 days.  (CBC and basic metabolic panel at follow-up) Continue apixaban, and watch for bleeding.  Will need CBC on follow-up. Hypokalemia needs to be resolved with the patient taking amiodarone.  Active cardiac diagnoses: Acute diastolic heart failure --> 1 dose of IV Lasix today.  If recurrent edema/dyspnea as outpatient, start p.o. Lasix. Persistent atrial fibrillation, new--> plan cardioversion in 3 to 4 weeks if does not convert spontaneously. Amiodarone therapy Anticoagulation therapy with Eliquis uninterrupted until 2 weeks post cardioversion. Hypokalemia, being repleted.   Progress Note  Patient Name: Justin Trujillo. Date of Encounter: 07/21/2021  [facility] Cardiologist: Cristopher Peru, MD   Subjective   Sitting up on the side of the bed. Hopeful to DC home today.   Inpatient Medications    Scheduled Meds:  acidophilus  1 capsule Oral Daily   apixaban  5 mg Oral BID   escitalopram  10 mg Oral Daily   furosemide  40 mg Oral Daily   gabapentin  300 mg Oral TID   insulin aspart  0-9 Units Subcutaneous TID WC   nicotine  21 mg Transdermal Daily   pantoprazole  40 mg Oral BID    potassium chloride  40 mEq Oral Q4H   primidone  50 mg Oral BID   rOPINIRole  0.5 mg Oral QHS   sucralfate  1 g Oral TID WC & HS   tamsulosin  0.4 mg Oral Daily   Continuous Infusions:  amiodarone 30 mg/hr (07/21/21 0432)   magnesium sulfate bolus IVPB     PRN Meds: acetaminophen, albuterol, ALPRAZolam, loperamide, morphine injection, ondansetron (ZOFRAN) IV, oxyCODONE-acetaminophen **AND** oxyCODONE, prochlorperazine, tiZANidine   Vital Signs    Vitals:   07/20/21 2002 07/20/21 2328 07/21/21 0423 07/21/21 0749  BP: 104/66 114/84 123/70 106/77  Pulse: 90  74   Resp: 18 18 17    Temp: 97.8 F (36.6 C) 97.7 F (36.5 C) 97.8 F (36.6 C)   TempSrc: Oral Oral Oral   SpO2: 96%  97%   Weight:      Height:        Intake/Output Summary (Last 24 hours) at 07/21/2021 0850 Last data filed at 07/21/2021 0846 Gross per 24 hour  Intake 745.73 ml  Output 400 ml  Net 345.73 ml   Last 3 Weights 07/15/2021 07/05/2021 06/22/2021  Weight (lbs) 170 lb 175 lb 4.8 oz 176 lb 3 oz  Weight (kg) 77.111 kg 79.516 kg 79.918 kg      Telemetry    Afib rates 90s - Personally Reviewed  ECG    Afib 79bpm - Personally Reviewed  Physical Exam   GEN: No acute distress.   Neck:  No JVD Cardiac: Irreg Irreg, + soft systolic murmur, no rubs, or gallops.  Respiratory: Faint crackles in LLL GI: Soft, nontender, non-distended  MS: 1+ bilateral LE edema; No deformity. Neuro:  Nonfocal  Psych: Normal affect   Labs    High Sensitivity Troponin:  No results for input(s): TROPONINIHS in the last 720 hours.   Chemistry Recent Labs  Lab 07/17/21 0225 07/18/21 0229 07/19/21 0318 07/20/21 0259 07/21/21 0447  NA 125* 127* 131* 129* 131*  K 2.9* 3.6 4.3 3.9 3.3*  CL 95* 100 107 103 100  CO2 23 20* 19* 21* 24  GLUCOSE 118* 100* 102* 91 90  BUN 18 14 9  6* 8  CREATININE 0.94 0.76 0.80 0.81 0.73  CALCIUM 7.7* 7.5* 7.7* 7.8* 8.0*  MG 2.0 1.9 1.7  --  1.5*  PROT 4.4* 4.4*  --   --  4.3*  ALBUMIN  1.9* 1.9*  --   --  2.0*  AST 30 25  --   --  27  ALT 26 22  --   --  22  ALKPHOS 49 47  --   --  89  BILITOT 0.6 0.6  --   --  0.6  GFRNONAA >60 >60 >60 >60 >60  ANIONGAP 7 7 5 5 7     Lipids No results for input(s): CHOL, TRIG, HDL, LABVLDL, LDLCALC, CHOLHDL in the last 168 hours.  Hematology Recent Labs  Lab 07/19/21 0318 07/20/21 0259 07/21/21 0447  WBC 8.6 20.0* 25.8*  RBC 3.52* 3.47* 3.41*  HGB 11.3* 11.1* 11.0*  HCT 33.5* 33.1* 32.5*  MCV 95.2 95.4 95.3  MCH 32.1 32.0 32.3  MCHC 33.7 33.5 33.8  RDW 20.4* 20.8* 21.1*  PLT 81* 85* 98*   Thyroid  Recent Labs  Lab 07/21/21 0447  TSH 1.661    BNPNo results for input(s): BNP, PROBNP in the last 168 hours.  DDimer No results for input(s): DDIMER in the last 168 hours.   Radiology    ECHOCARDIOGRAM LIMITED  Result Date: 07/19/2021    ECHOCARDIOGRAM LIMITED REPORT   Patient Name:   Justin Trujillo. Date of Exam: 07/19/2021 Medical Rec #:  [id number]          Height:       66.0 in Accession #:    [id number]         Weight:       170.0 lb Date of Birth:  1946/01/23          BSA:          1.866 m Patient Age:    75 years           BP:           122/73 mmHg Patient Gender: M                  HR:           108 bpm. Exam Location:  Inpatient Procedure: Limited Echo and Intracardiac Opacification Agent Indications:    Assess LVF w/ Definity  History:        Patient has prior history of Echocardiogram examinations, most                 recent 07/18/2021. CAD, COPD, Signs/Symptoms:Dyspnea and Chest                 Pain; Risk Factors:Diabetes, Hypertension and Dyslipidemia.  Sonographer:    Melissa Morford RDCS (AE, PE) Referring Phys: [employer]  IMPRESSIONS  1. LV function is hyperdynamic EF 65-70%; Normal basal and mid segments. However the apex is akinetic indicating a possible stress cardiomyopathy vs. distal LAD disease. No LV thrombus. Recommend EKG and serial troponin levels. FINDINGS  Left Ventricle: LV function is  hyperdynamic EF 65-70%; Normal basal and mid segments. However the apex is akinetic indicating a possible stress cardiomyopathy vs. distal LAD disease. No LV thrombus. Recommend EKG and serial troponin levels. Phineas Inches Electronically signed by Phineas Inches Signature Date/Time: 07/19/2021/2:18:03 PM    Final     Cardiac Studies   Echo: 07/18/21   IMPRESSIONS     1. LVEF appears at least mild to moderately depressed with hypokinesis of  the distal half of the LV WOuld recomm Limited echo with Definity to  confirm LVEF and wall motion. . The left ventricle has no regional wall  motion abnormalities. Left ventricular   diastolic parameters are indeterminate.   2. Right ventricular systolic function is low normal. The right  ventricular size is normal.   3. Trivial mitral valve regurgitation. Moderate mitral annular  calcification.   4. Aortic valve regurgitation is not visualized. Mild to moderate aortic  valve sclerosis/calcification is present, without any evidence of aortic  stenosis.   5. The inferior vena cava is normal in size with greater than 50%  respiratory variability, suggesting right atrial pressure of 3 mmHg.   FINDINGS   Left Ventricle: LVEF appears at least mild to moderately depressed with  hypokinesis of the distal half of the LV WOuld recomm Limited echo with  Definity to confirm LVEF and wall motion. The left ventricle has no  regional wall motion abnormalities. The  left ventricular internal cavity size was normal in size. There is no left  ventricular hypertrophy. Left ventricular diastolic parameters are  indeterminate.   Right Ventricle: The right ventricular size is normal. Right vetricular  wall thickness was not assessed. Right ventricular systolic function is  low normal.   Left Atrium: Left atrial size was normal in size.   Right Atrium: Right atrial size was normal in size.   Pericardium: There is no evidence of pericardial effusion.   Mitral  Valve: There is mild thickening of the mitral valve leaflet(s).  Moderate mitral annular calcification. Trivial mitral valve regurgitation.   Tricuspid Valve: The tricuspid valve is normal in structure. Tricuspid  valve regurgitation is trivial.   Aortic Valve: Aortic valve regurgitation is not visualized. Mild to  moderate aortic valve sclerosis/calcification is present, without any  evidence of aortic stenosis. Aortic valve mean gradient measures 6.7 mmHg.  Aortic valve peak gradient measures 16.2  mmHg.   Pulmonic Valve: The pulmonic valve was not well visualized. Pulmonic valve  regurgitation is not visualized. No evidence of pulmonic stenosis.   Aorta: The aortic root is normal in size and structure.   Venous: The inferior vena cava is normal in size with greater than 50%  respiratory variability, suggesting right atrial pressure of 3 mmHg.   IAS/Shunts: No atrial level shunt detected by color flow Doppler.    Limited Echo: 07/19/21   IMPRESSIONS     1. LV function is hyperdynamic EF 65-70%; Normal basal and mid segments.  However the apex is akinetic indicating a possible stress cardiomyopathy  vs. distal LAD disease. No LV thrombus. Recommend EKG and serial troponin  levels.   FINDINGS   Left Ventricle: LV function is hyperdynamic EF 65-70%; Normal basal and  mid segments. However the apex is akinetic indicating a  possible stress  cardiomyopathy vs. distal LAD disease. No LV thrombus. Recommend EKG and  serial troponin levels.   Phineas Inches  Electronically signed by Phineas Inches  Signature Date/Time: 07/19/2021/2:18:03 PM   Patient Profile     75 y.o. 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 Sloan Leiter.  Assessment & Plan     Paroxysmal atrial fibrillation: Denies any hx of the same. Developed the morning of 11/1 with associated chest pain. Rates was mostly controlled without any rate controlling agents. Anticoagulation was initially held in the setting of thrombocytopenia. He denies any palpitations.  --CHA2DS2-VASc Score at least 4 --Started on IV amiodarone with bolus, remains in Afib rate controlled while at rest. Worked with PT yesterday. Rates remained stable.  -- Transition to amiodarone 400mg  BID x1 week, then 200mg  BID until seen in the office. If remains in afib then will need to be set up for DCCV. Would not proceed with TEE given his esophageal adenocarcinoma. -- tolerating Eliquis 5mg  BID   HFpEF: Echo 11/1 noted concern for mild to moderate LV dysfunction with recommendations for repeat limited echo. Limited echo reported EF of 65-70% with akinetic apex. 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 Lorenso Courier as an outpatient   Nausea/vomiting/dehydration: this has significantly improved since admission. No longer having n/v/d   Thrombocytopenia: counts have been on the low side the past couple of months but since admission have dipped in the 80, upper 70 range. Undergoing chemotherapy with last cycle 10/19.  -- stable in the 90s today   Per Primary team: DM: Hgb A1c 5.9 Hypokalemia/hyponatremia/hypomag: K+ 3.3, Na+ 131, Mag+ 1.5, replete this morning Leukocytosis: WBC up to 25 this morning, in response to CA meds. Afebrile Tobacco use: he continues to smoke  For questions or updates, please contact [place] Please consult [email or web address]  for contact info under    Signed, Reino Bellis, NP  07/21/2021, 8:50 AM

## 2021-07-24 DIAGNOSIS — J449 Chronic obstructive pulmonary disease, unspecified: Secondary | ICD-10-CM | POA: Diagnosis not present

## 2021-07-24 DIAGNOSIS — E119 Type 2 diabetes mellitus without complications: Secondary | ICD-10-CM | POA: Diagnosis not present

## 2021-07-24 DIAGNOSIS — I10 Essential (primary) hypertension: Secondary | ICD-10-CM | POA: Diagnosis not present

## 2021-07-24 DIAGNOSIS — C770 Secondary and unspecified malignant neoplasm of lymph nodes of head, face and neck: Secondary | ICD-10-CM | POA: Diagnosis not present

## 2021-07-24 DIAGNOSIS — I480 Paroxysmal atrial fibrillation: Secondary | ICD-10-CM | POA: Diagnosis not present

## 2021-07-24 DIAGNOSIS — C159 Malignant neoplasm of esophagus, unspecified: Secondary | ICD-10-CM | POA: Diagnosis not present

## 2021-07-24 DIAGNOSIS — Z7985 Long-term (current) use of injectable non-insulin antidiabetic drugs: Secondary | ICD-10-CM | POA: Diagnosis not present

## 2021-07-25 ENCOUNTER — Other Ambulatory Visit (HOSPITAL_COMMUNITY): Payer: Self-pay

## 2021-07-26 NOTE — Discharge Summary (Signed)
PATIENT DETAILS Name: Justin Trujillo. Age: 75 y.o. Sex: male Date of Birth: [birth year] MRN: [id number]. Admitting Physician: Orma Flaming, MD HYW:VPXTG, Hal Hope, MD  Admit Date: 07/15/2021 Discharge date: 07/21/2021  Recommendations for Outpatient Follow-up:  Follow up with PCP in 1-2 weeks Please obtain CMP/CBC in one week Please ensure follow-up with oncology and cardiology.  Admitted From:  Home  Disposition: [place]at [place]: No  Equipment/Devices: None  Discharge Condition: Stable  CODE STATUS: FULL CODE  Diet recommendation:  Diet Order             Diet - low sodium heart healthy                    Brief Summary: Patient is a 75 y.o. male with history of metastatic esophageal cancer-s/p palliative radiation-on chemotherapy-presented to the hospital with recurrent nausea/vomiting.  Hospital course complicated by development of A. fib with RVR.  See below for further details.   Pertinent Labs/Radiology: 10/27>> CT chest/abdomen/pelvis: Soft tissue thickening in the distal esophagus-no suspicious findings of metastatic disease. 10/27>> CT soft tissue neck: No new adenopathy-no acute abnormalities.   10/31>> barium esophagogram.  No esophageal stricture. 11/1>> Echo: EF appears mild to moderately depressed 11/2>> Limited echo: EF 65-70% apex is akinetic.  Brief Hospital Course: Intractable nausea/vomiting: Due to underlying malignancy/recent chemotherapy.  Barium esophagogram negative for strictures.  CT abdomen negative for SBO.  Treated with antiemetics-briefly on scheduled Zofran-but has been changed to as needed dosing.  Patient has been requesting discharge for the past 2-3 days-is again just requesting discharge today-since he is tolerating a regular diet-I suspect he is as stable as 1 could be to go home.   Epigastric pain: Due to reflux/nausea/vomiting/distal esophageal adenocarcinoma-continues to have intermittent epigastric  pain/dyspepsia like symptoms-continue PPI and Carafate.  May need to add H2 blocker if symptoms continue.     Hyponatremia: Initially felt to be due to dehydration-and sodium levels that improved with IVF.  However he is now has some lower extremity edema-hyponatremia lately is felt to be from volume overload.  He was started on Lasix-sodium levels have stabilized and actually increasing.  Please recheck sodium levels at next follow-up.   Hypokalemia: Due to GI loss-repleted.   New onset PAF with RVR: Continues to be in A. fib/flutter-rate better controlled with amiodarone.  Was on amiodarone infusion-has been transitioned to oral amiodarone today.  Remains on Eliquis.  Echo as above.  Discussed cardiology-okay to discharge-cardiology will arrange for outpatient follow-up.    HFpEF with mild exacerbation: Has mild lower extremity edema-but denies any shortness of breath to me this morning.  He has been started on Lasix since 11/3-cardiology planning to give him a dose of IV Lasix today-following which will be observed-if he remains stable-he will be discharged home on diuretics (has been very anxious to go home for the past several days).  Blood pressure too soft to add additional medications including beta-blocker-this can be reassessed in the outpatient setting.   Pancytopenia: Likely sequelae of chemotherapy-improving-no longer on Granix.   Distal esophageal adenocarcinoma: S/p palliative radiation completed on 7/20-last cycle of chemotherapy on 10/19.  Oncology following.   DM-2 (A1c 5.9): CBG stable with SSI-resume usual outpatient diabetic regimen on discharge.  BPH: Continue Flomax   Essential tremor: Continue primidone/Requip   Peripheral neuropathy: Likely due to diabetes-continue Neurontin.   Depression/anxiety: Relatively stable-continue Lexapro and as needed Xanax.   Tobacco: Continue transdermal nicotine-counseled regarding importance of quitting.  BMI Estimated body mass  index is 27.44 kg/m as calculated from the following:   Height as of this encounter: 5\' 6"  (1.676 m).   Weight as of this encounter: 77.1 kg.    Procedures None  Discharge Diagnoses:  Active Problems:   OBSTRUCTIVE SLEEP APNEA   DM type 2 (diabetes mellitus, type 2) (HCC)   CAD (coronary artery disease)   HTN (hypertension)   Dyslipidemia   GERD (gastroesophageal reflux disease)   Malignant neoplasm of lower third of esophagus (HCC)   AKI (acute kidney injury) (HCC)   Intractable nausea and vomiting   Dehydration   Metabolic acidosis, increased anion gap   Chemotherapy induced nausea and vomiting   Vomiting without nausea   Discharge Instructions:  Activity:  As tolerated with Full fall precautions use walker/cane & assistance as needed   Discharge Instructions     (HEART FAILURE PATIENTS) Call MD:  Anytime you have any of the following symptoms: 1) 3 pound weight gain in 24 hours or 5 pounds in 1 week 2) shortness of breath, with or without a dry hacking cough 3) swelling in the hands, feet or stomach 4) if you have to sleep on extra pillows at night in order to breathe.   Complete by: As directed    Call MD for:  difficulty breathing, headache or visual disturbances   Complete by: As directed    Diet - low sodium heart healthy   Complete by: As directed    Discharge instructions   Complete by: As directed    Follow with Primary MD  Carol Ada, MD in 1-2 weeks  Please get a complete blood count and chemistry panel checked by your Primary MD at your next visit, and again as instructed by your Primary MD.  Get Medicines reviewed and adjusted: Please take all your medications with you for your next visit with your Primary MD  Laboratory/radiological data: Please request your Primary MD to go over all hospital tests and procedure/radiological results at the follow up, please ask your Primary MD to get all Hospital records sent to his/her office.  In some cases,  they will be blood work, cultures and biopsy results pending at the time of your discharge. Please request that your primary care M.D. follows up on these results.  Also Note the following: If you experience worsening of your admission symptoms, develop shortness of breath, life threatening emergency, suicidal or homicidal thoughts you must seek medical attention immediately by calling 911 or calling your MD immediately  if symptoms less severe.  You must read complete instructions/literature along with all the possible adverse reactions/side effects for all the Medicines you take and that have been prescribed to you. Take any new Medicines after you have completely understood and accpet all the possible adverse reactions/side effects.   Do not drive when taking Pain medications or sleeping medications (Benzodaizepines)  Do not take more than prescribed Pain, Sleep and Anxiety Medications. It is not advisable to combine anxiety,sleep and pain medications without talking with your primary care practitioner  Special Instructions: If you have smoked or chewed Tobacco  in the last 2 yrs please stop smoking, stop any regular Alcohol  and or any Recreational drug use.  Wear Seat belts while driving.  Please note: You were cared for by a hospitalist during your hospital stay. Once you are discharged, your primary care physician will handle any further medical issues. Please note that NO REFILLS for any discharge medications will be authorized once  you are discharged, as it is imperative that you return to your primary care physician (or establish a relationship with a primary care physician if you do not have one) for your post hospital discharge needs so that they can reassess your need for medications and monitor your lab values.   Increase activity slowly   Complete by: As directed       Allergies as of 07/21/2021       Reactions   Iodinated Diagnostic Agents Anaphylaxis   Moxifloxacin Swelling    REACTION: GI upset, throat "tightened up"   Penicillins Anaphylaxis   Did it involve swelling of the face/tongue/throat, SOB, or low BP? Yes Did it involve sudden or severe rash/hives, skin peeling, or any reaction on the inside of your mouth or nose? No Did you need to seek medical attention at a hospital or doctor's office? Yes When did it last happen?   been a while     If all above answers are "NO", may proceed with cephalosporin use.   Shellfish-derived Products Anaphylaxis   Amoxicillin Other (See Comments)   Chlorhexidine    Port not accessed   Other Other (See Comments)   Latex Rash   Over long periods of time        Medication List     STOP taking these medications    lisinopril 5 MG tablet Commonly known as: ZESTRIL   ondansetron 8 MG tablet Commonly known as: ZOFRAN   sulfamethoxazole-trimethoprim 800-160 MG tablet Commonly known as: BACTRIM DS       TAKE these medications    acetaminophen 500 MG tablet Commonly known as: TYLENOL Take 650 mg by mouth every 8 (eight) hours as needed for mild pain (pain).   albuterol 108 (90 Base) MCG/ACT inhaler Commonly known as: VENTOLIN HFA Inhale 1 puff into the lungs every 6 (six) hours as needed for wheezing.   ALPRAZolam 0.5 MG tablet Commonly known as: XANAX Take 0.5 mg by mouth 2 (two) times daily as needed for anxiety.   amiodarone 200 MG tablet Commonly known as: PACERONE Take 2 tablets (400 mg total) by mouth 2 (two) times daily x1 week, and then switch to 1 tablet (200 mg) twice daily   atorvastatin 40 MG tablet Commonly known as: LIPITOR Take 1 tablet (40 mg total) by mouth daily.   CALCIUM 600+D3 PO Take 1 tablet by mouth daily.   Centrum Silver tablet daily in the afternoon.   Eliquis 5 MG Tabs tablet Generic drug: apixaban Take 1 tablet (5 mg total) by mouth 2 (two) times daily.   escitalopram 10 MG tablet Commonly known as: LEXAPRO Take 10 mg by mouth daily.   furosemide 40 MG  tablet Commonly known as: Lasix Take 1 tablet (40 mg total) by mouth daily.   gabapentin 300 MG capsule Commonly known as: NEURONTIN Take 2 capsules (600 mg total) by mouth 3 (three) times daily. What changed: how much to take   lidocaine-prilocaine cream Commonly known as: EMLA Apply 1 application topically as needed.   magnesium oxide 400 MG tablet Commonly known as: MAG-OX Take 1 tablet (400 mg total) by mouth daily.   Myrbetriq 50 MG Tb24 tablet Generic drug: mirabegron ER Take 50 mg by mouth daily.   ondansetron 4 MG disintegrating tablet Commonly known as: Zofran ODT Take 1 tablet (4 mg total) by mouth every 8 (eight) hours as needed for nausea or vomiting.   oxyCODONE-acetaminophen 10-325 MG tablet Commonly known as: PERCOCET Take 1 tablet by  mouth every 6 (six) hours as needed.   Ozempic (0.25 or 0.5 MG/DOSE) 2 MG/1.5ML Sopn Generic drug: Semaglutide(0.25 or 0.5MG /DOS) Ozempic 0.25 mg or 0.5 mg (2 mg/1.5 mL) subcutaneous pen injector  INJECT 0.5 MG SUBCUTANEOUSLY ONCE WEEKLY   pantoprazole 40 MG tablet Commonly known as: PROTONIX Take 1 tablet (40 mg total) by mouth 2 (two) times daily.   potassium chloride SA 20 MEQ tablet Commonly known as: KLOR-CON Take 2 tablets (40 mEq total) by mouth daily.   predniSONE 50 MG tablet Commonly known as: DELTASONE Take 1 tablet 13 hours, 7 hours and 1 hour prior to CT Scan   primidone 50 MG tablet Commonly known as: MYSOLINE Take 1 tablet (50 mg total) by mouth in the morning and at bedtime.   prochlorperazine 10 MG tablet Commonly known as: COMPAZINE Take 1 tablet (10 mg total) by mouth every 6 (six) hours as needed for nausea or vomiting.   rOPINIRole 0.5 MG tablet Commonly known as: REQUIP Take 0.5 mg by mouth at bedtime.   sucralfate 1 GM/10ML suspension Commonly known as: CARAFATE Take 10 mLs (1 g total) by mouth 4 (four) times daily -  with meals and at bedtime.   tamsulosin 0.4 MG Caps capsule Commonly  known as: FLOMAX Take 0.4 mg by mouth daily.   TESTOSTERONE IM Inject 1 mL into the muscle every 14 (fourteen) days.   tiZANidine 4 MG tablet Commonly known as: ZANAFLEX Take 4 mg by mouth 3 (three) times daily as needed.   trolamine salicylate 10 % cream Commonly known as: ASPERCREME Apply 1 application topically 2 (two) times daily as needed for muscle pain.   Vitamin D 50 MCG (2000 UT) tablet Take 2,000 Units by mouth daily.        Follow-up Information     Evans Lance, MD Follow up on 08/01/2021.   Specialty: Cardiology Why: at 9:30am for your follow up appt. Please arrive 15 prior. Contact information: [address] [place] [address] [phone]         Carol Ada, MD. Schedule an appointment as soon as possible for a visit in 1 week(s).   Specialty: Family Medicine Contact information: [place] [address] [phone]         Orson Slick, MD Follow up.   Specialty: Hematology and Oncology Why: Hospital follow up, Office will call with date/time, If you dont hear from them,please give them a call, Repeat electrolytes Contact information: [address] [name] [address] [phone]                Allergies  Allergen Reactions   Iodinated Diagnostic Agents Anaphylaxis   Moxifloxacin Swelling    REACTION: GI upset, throat "tightened up"   Penicillins Anaphylaxis    Did it involve swelling of the face/tongue/throat, SOB, or low BP? Yes Did it involve sudden or severe rash/hives, skin peeling, or any reaction on the inside of your mouth or nose? No Did you need to seek medical attention at a hospital or doctor's office? Yes When did it last happen?   been a while     If all above answers are "NO", may proceed with cephalosporin use.    Shellfish-Derived Products Anaphylaxis   Amoxicillin Other (See Comments)   Chlorhexidine     Port not accessed   Other Other (See  Comments)   Latex Rash    Over long periods of time  Consultations: GI, cardiology, oncology   Other Procedures/Studies: CT Soft Tissue Neck W Contrast  Result Date: 07/16/2021 CLINICAL DATA:  Esophageal cancer EXAM: CT NECK WITH CONTRAST TECHNIQUE: Multidetector CT imaging of the neck was performed using the standard protocol following the bolus administration of intravenous contrast. CONTRAST:  45mL OMNIPAQUE IOHEXOL 350 MG/ML SOLN COMPARISON:  PET-CT 02/22/2021 FINDINGS: Pharynx and larynx: Normal. No mass or swelling. Salivary glands: No inflammation, mass, or stone. Thyroid: Negative Lymph nodes: No enlarged or pathologic lymph nodes. Hypermetabolic small lymph node lateral to the proximal sternocleidomastoid muscle on the right has improved since the prior PET CT. No new adenopathy. Vascular: Significant atherosclerotic disease in the carotid bifurcation bilaterally with carotid stenosis bilaterally. Normal venous enhancement. Limited intracranial: No acute abnormality Visualized orbits: Negative Mastoids and visualized paranasal sinuses: Mild mucosal edema maxillary sinus bilaterally. Remaining sinuses clear. Skeleton: Cervical spondylosis. No acute skeletal abnormality. 1 cm lesion in the angle the mandible on the right inferiorly. Central sclerosis with surrounding lucency. No aggressive bony destruction. This was not hypermetabolic on prior PET. Upper chest: Lung apices clear bilaterally. Other: None IMPRESSION: 1. Regression of small hypermetabolic lymph node in the right upper neck overlying the right sternocleidomastoid muscle. No new adenopathy 2. Nonaggressive appearing lesion in the right angle the mandible which appears to be an incidental benign finding of uncertain etiology. Electronically Signed   By: Franchot Gallo M.D.   On: 07/16/2021 12:26   CT CHEST ABDOMEN PELVIS W CONTRAST  Result Date: 07/15/2021 CLINICAL DATA:  Esophageal cancer, assess treatment response EXAM: CT  CHEST, ABDOMEN, AND PELVIS WITH CONTRAST TECHNIQUE: Multidetector CT imaging of the chest, abdomen and pelvis was performed following the standard protocol during bolus administration of intravenous contrast. CONTRAST:  40mL OMNIPAQUE IOHEXOL 350 MG/ML SOLN COMPARISON:  PET-CT dated 02/22/2021 FINDINGS: CT CHEST FINDINGS Cardiovascular: The heart is normal in size. No pericardial effusion. No evidence of thoracic aortic aneurysm. Atherosclerotic calcifications of the aortic arch. Three vessel coronary atherosclerosis. Right chest port terminates in the lower SVC. Mediastinum/Nodes: No suspicious mediastinal lymphadenopathy. Stable residual soft tissue thickening along the left posterolateral distal esophagus (series 3/image 46), measuring up to 11 mm in thickness, compatible with the patient's known esophageal cancer. Visualized thyroid is unremarkable. Lungs/Pleura: Mild subpleural reticulation/fibrosis in the lungs bilaterally, mid/lower lung predominant, suggesting mild chronic interstitial lung disease. No suspicious pulmonary nodules. No focal consolidation. No pleural effusion or pneumothorax. Musculoskeletal: Bilateral shoulder arthroplasties. Mild degenerative changes of the lower thoracic spine. CT ABDOMEN PELVIS FINDINGS Hepatobiliary: Liver is within normal limits. No suspicious/enhancing hepatic lesions. Gallbladder is unremarkable. No intrahepatic or extrahepatic ductal dilatation. Pancreas: Within normal limits. Spleen: Within normal limits. Adrenals/Urinary Tract: Adrenal glands are within normal limits. 8 mm posterior interpolar right renal cyst (series 3/image 69). Additional subcentimeter cysts bilaterally. No hydronephrosis. Bladder is within normal limits. Stomach/Bowel: Stomach is within normal limits. No evidence of bowel obstruction. Normal appendix (series 3/image 80). Mild left colonic diverticulosis, without evidence of diverticulitis. Vascular/Lymphatic: No evidence of abdominal aortic  aneurysm. Atherosclerotic calcifications of the abdominal aorta and branch vessels. No suspicious abdominopelvic lymphadenopathy. Specifically, no gastrohepatic/celiac axis lymphadenopathy. Reproductive: Prostate is unremarkable. Other: No abdominopelvic ascites. Musculoskeletal: Status post PLIF at L3-4. Probable residual mentation at L4. Moderate multilevel degenerative changes. IMPRESSION: Stable soft tissue thickening along the left posterolateral distal esophagus, corresponding to the patient's known esophageal cancer. No findings suspicious for metastatic disease. Additional ancillary findings as above. Electronically Signed   By: Julian Hy M.D.   On:  07/15/2021 20:01   ECHOCARDIOGRAM COMPLETE  Result Date: 07/18/2021    ECHOCARDIOGRAM REPORT   Patient Name:   Justin Trujillo. Date of Exam: 07/18/2021 Medical Rec #:  [id number]          Height:       66.0 in Accession #:    [id number]         Weight:       170.0 lb Date of Birth:  July 10, 1946          BSA:          1.866 m Patient Age:    33 years           BP:           127/79 mmHg Patient Gender: M                  HR:           114 bpm. Exam Location:  Inpatient Procedure: 2D Echo, Cardiac Doppler and Color Doppler Indications:    I48.1 Persistent atrial fibrillation  History:        Patient has no prior history of Echocardiogram examinations.                 CAD, COPD, Signs/Symptoms:Dyspnea and Chest Pain; Risk                 Factors:Diabetes and Dyslipidemia.  Sonographer:    Glo Herring Referring Phys: [place]  1. LVEF appears at least mild to moderately depressed with hypokinesis of the distal half of the LV WOuld recomm Limited echo with Definity to confirm LVEF and wall motion. . The left ventricle has no regional wall motion abnormalities. Left ventricular  diastolic parameters are indeterminate.  2. Right ventricular systolic function is low normal. The right ventricular size is normal.  3. Trivial mitral  valve regurgitation. Moderate mitral annular calcification.  4. Aortic valve regurgitation is not visualized. Mild to moderate aortic valve sclerosis/calcification is present, without any evidence of aortic stenosis.  5. The inferior vena cava is normal in size with greater than 50% respiratory variability, suggesting right atrial pressure of 3 mmHg. FINDINGS  Left Ventricle: LVEF appears at least mild to moderately depressed with hypokinesis of the distal half of the LV WOuld recomm Limited echo with Definity to confirm LVEF and wall motion. The left ventricle has no regional wall motion abnormalities. The left ventricular internal cavity size was normal in size. There is no left ventricular hypertrophy. Left ventricular diastolic parameters are indeterminate. Right Ventricle: The right ventricular size is normal. Right vetricular wall thickness was not assessed. Right ventricular systolic function is low normal. Left Atrium: Left atrial size was normal in size. Right Atrium: Right atrial size was normal in size. Pericardium: There is no evidence of pericardial effusion. Mitral Valve: There is mild thickening of the mitral valve leaflet(s). Moderate mitral annular calcification. Trivial mitral valve regurgitation. Tricuspid Valve: The tricuspid valve is normal in structure. Tricuspid valve regurgitation is trivial. Aortic Valve: Aortic valve regurgitation is not visualized. Mild to moderate aortic valve sclerosis/calcification is present, without any evidence of aortic stenosis. Aortic valve mean gradient measures 6.7 mmHg. Aortic valve peak gradient measures 16.2 mmHg. Pulmonic Valve: The pulmonic valve was not well visualized. Pulmonic valve regurgitation is not visualized. No evidence of pulmonic stenosis. Aorta: The aortic root is normal in size and structure. Venous: The inferior vena cava is normal in size with greater than 50%  respiratory variability, suggesting right atrial pressure of 3 mmHg. IAS/Shunts:  No atrial level shunt detected by color flow Doppler.  LEFT VENTRICLE PLAX 2D LVIDd:         3.50 cm LVIDs:         2.50 cm LV PW:         1.10 cm LV IVS:        1.00 cm  RIGHT VENTRICLE          IVC RV Basal diam:  3.90 cm  IVC diam: 1.80 cm LEFT ATRIUM           Index        RIGHT ATRIUM           Index LA diam:      3.80 cm 2.04 cm/m   RA Area:     17.60 cm LA Vol (A4C): 53.1 ml 28.45 ml/m  RA Volume:   41.80 ml  22.40 ml/m  AORTIC VALVE                    PULMONIC VALVE AV Vmax:           201.33 cm/s  PV Vmax:       1.32 m/s AV Vmean:          117.667 cm/s PV Peak grad:  7.0 mmHg AV VTI:            0.255 m AV Peak Grad:      16.2 mmHg AV Mean Grad:      6.7 mmHg LVOT Vmax:         90.62 cm/s LVOT Vmean:        58.950 cm/s LVOT VTI:          0.128 m LVOT/AV VTI ratio: 0.50  AORTA Ao Root diam: 3.60 cm  SHUNTS Systemic VTI: 0.13 m Dorris Carnes MD Electronically signed by Dorris Carnes MD Signature Date/Time: 07/18/2021/7:57:11 PM    Final    ECHOCARDIOGRAM LIMITED  Result Date: 07/19/2021    ECHOCARDIOGRAM LIMITED REPORT   Patient Name:   Justin Trujillo. Date of Exam: 07/19/2021 Medical Rec #:  [id number]          Height:       66.0 in Accession #:    [id number]         Weight:       170.0 lb Date of Birth:  10-17-1945          BSA:          1.866 m Patient Age:    10 years           BP:           122/73 mmHg Patient Gender: M                  HR:           108 bpm. Exam Location:  Inpatient Procedure: Limited Echo and Intracardiac Opacification Agent Indications:    Assess LVF w/ Definity  History:        Patient has prior history of Echocardiogram examinations, most                 recent 07/18/2021. CAD, COPD, Signs/Symptoms:Dyspnea and Chest                 Pain; Risk Factors:Diabetes, Hypertension and Dyslipidemia.  Sonographer:    Melissa Morford RDCS (AE, PE) Referring Phys: [place]  1. LV function is hyperdynamic EF 65-70%; Normal basal and mid segments. However the apex is  akinetic indicating a possible stress cardiomyopathy vs. distal LAD disease. No LV thrombus. Recommend EKG and serial troponin levels. FINDINGS  Left Ventricle: LV function is hyperdynamic EF 65-70%; Normal basal and mid segments. However the apex is akinetic indicating a possible stress cardiomyopathy vs. distal LAD disease. No LV thrombus. Recommend EKG and serial troponin levels. Phineas Inches Electronically signed by Phineas Inches Signature Date/Time: 07/19/2021/2:18:03 PM    Final    DG ESOPHAGUS W SINGLE CM (SOL OR THIN BA)  Result Date: 07/17/2021 CLINICAL DATA:  History of esophageal cancer with nausea and vomiting. EXAM: ESOPHAGUS/BARIUM SWALLOW TECHNIQUE: Single contrast examination was performed using thin liquid barium. This exam was performed by Soyla Dryer, and was supervised and interpreted by Abigail Miyamoto. FLUOROSCOPY TIME:  Radiation Exposure Index (as provided by the fluoroscopic device): Not applicable. If the device does not provide the exposure index: Fluoroscopy Time:  1 minute and 36 seconds Number of Acquired Images:  None COMPARISON:  Chest CT 07/13/2021 FINDINGS: Focused exam was performed with the patient supine and in various obliquities. Focused single-contrast exam performed. Full column evaluation of the esophagus demonstrates no persistent narrowing or stricture. Spontaneous gastroesophageal reflux is identified to the level of the clavicles. A 13 mm barium tablet passes promptly. IMPRESSION: Single-contrast, focused exam performed as detailed above. No esophageal stricture. Spontaneous gastroesophageal reflux. Electronically Signed   By: Abigail Miyamoto M.D.   On: 07/17/2021 09:48     TODAY-DAY OF DISCHARGE:  Subjective:   Jayme Cloud today has no headache,no chest abdominal pain,no new weakness tingling or numbness, feels much better wants to go home today.   Objective:   Blood pressure 115/74, pulse 96, temperature 97.8 F (36.6 C), temperature source Oral, resp. rate  18, height 5\' 6"  (1.676 m), weight 77.1 kg, SpO2 97 %. No intake or output data in the 24 hours ending 07/26/21 1454  Filed Weights   07/15/21 1057  Weight: 77.1 kg    Exam: Awake Alert, Oriented *3, No new F.N deficits, Normal affect [place].AT,PERRAL Supple Neck,No JVD, No cervical lymphadenopathy appriciated.  Symmetrical Chest wall movement, Good air movement bilaterally, CTAB RRR,No Gallops,Rubs or new Murmurs, No Parasternal Heave +ve B.Sounds, Abd Soft, Non tender, No organomegaly appriciated, No rebound -guarding or rigidity. No Cyanosis, Clubbing or edema, No new Rash or bruise   PERTINENT RADIOLOGIC STUDIES: No results found.   PERTINENT LAB RESULTS: CBC: No results for input(s): WBC, HGB, HCT, PLT in the last 72 hours.  CMET CMP     Component Value Date/Time   NA 131 (L) 07/21/2021 0447   NA 133 (L) 01/10/2021 0959   K 3.3 (L) 07/21/2021 0447   CL 100 07/21/2021 0447   CO2 24 07/21/2021 0447   GLUCOSE 90 07/21/2021 0447   BUN 8 07/21/2021 0447   BUN 16 01/10/2021 0959   CREATININE 0.73 07/21/2021 0447   CREATININE 0.84 07/05/2021 1048   CREATININE 0.76 01/14/2012 0750   CALCIUM 8.0 (L) 07/21/2021 0447   PROT 4.3 (L) 07/21/2021 0447   ALBUMIN 2.0 (L) 07/21/2021 0447   AST 27 07/21/2021 0447   AST 23 07/05/2021 1048   ALT 22 07/21/2021 0447   ALT 14 07/05/2021 1048   ALKPHOS 89 07/21/2021 0447   BILITOT 0.6 07/21/2021 0447   BILITOT 0.4 07/05/2021 1048   GFRNONAA >60 07/21/2021 0447   GFRNONAA >60 07/05/2021 1048   GFRAA >60 03/23/2019  1540    GFR Estimated Creatinine Clearance: 76.8 mL/min (by C-G formula based on SCr of 0.73 mg/dL). No results for input(s): LIPASE, AMYLASE in the last 72 hours. No results for input(s): CKTOTAL, CKMB, CKMBINDEX, TROPONINI in the last 72 hours. Invalid input(s): POCBNP No results for input(s): DDIMER in the last 72 hours. No results for input(s): HGBA1C in the last 72 hours. No results for input(s): CHOL, HDL,  LDLCALC, TRIG, CHOLHDL, LDLDIRECT in the last 72 hours. No results for input(s): TSH, T4TOTAL, T3FREE, THYROIDAB in the last 72 hours.  Invalid input(s): FREET3  No results for input(s): VITAMINB12, FOLATE, FERRITIN, TIBC, IRON, RETICCTPCT in the last 72 hours. Coags: No results for input(s): INR in the last 72 hours.  Invalid input(s): PT Microbiology: No results found for this or any previous visit (from the past 240 hour(s)).   FURTHER DISCHARGE INSTRUCTIONS:  Get Medicines reviewed and adjusted: Please take all your medications with you for your next visit with your Primary MD  Laboratory/radiological data: Please request your Primary MD to go over all hospital tests and procedure/radiological results at the follow up, please ask your Primary MD to get all Hospital records sent to his/her office.  In some cases, they will be blood work, cultures and biopsy results pending at the time of your discharge. Please request that your primary care M.D. goes through all the records of your hospital data and follows up on these results.  Also Note the following: If you experience worsening of your admission symptoms, develop shortness of breath, life threatening emergency, suicidal or homicidal thoughts you must seek medical attention immediately by calling 911 or calling your MD immediately  if symptoms less severe.  You must read complete instructions/literature along with all the possible adverse reactions/side effects for all the Medicines you take and that have been prescribed to you. Take any new Medicines after you have completely understood and accpet all the possible adverse reactions/side effects.   Do not drive when taking Pain medications or sleeping medications (Benzodaizepines)  Do not take more than prescribed Pain, Sleep and Anxiety Medications. It is not advisable to combine anxiety,sleep and pain medications without talking with your primary care practitioner  Special  Instructions: If you have smoked or chewed Tobacco  in the last 2 yrs please stop smoking, stop any regular Alcohol  and or any Recreational drug use.  Wear Seat belts while driving.  Please note: You were cared for by a hospitalist during your hospital stay. Once you are discharged, your primary care physician will handle any further medical issues. Please note that NO REFILLS for any discharge medications will be authorized once you are discharged, as it is imperative that you return to your primary care physician (or establish a relationship with a primary care physician if you do not have one) for your post hospital discharge needs so that they can reassess your need for medications and monitor your lab values.  Total Time spent coordinating discharge including counseling, education and face to face time equals 35 minutes.  SignedOren Binet 07/26/2021 2:54 PM

## 2021-07-27 DIAGNOSIS — C153 Malignant neoplasm of upper third of esophagus: Secondary | ICD-10-CM | POA: Diagnosis not present

## 2021-07-28 ENCOUNTER — Telehealth: Payer: Self-pay | Admitting: *Deleted

## 2021-07-28 NOTE — Telephone Encounter (Signed)
Received vm message from pt stating he is too weak to do his chemo next week. He wants to know if he still needs to see Dr. Lorenso Courier. TCT patient and spoke with him. Advised that Dr. Lorenso Courier still wants to see him and check his labs on the 16th. Pt voiced understanding and will come to those appts.

## 2021-07-31 DIAGNOSIS — Z09 Encounter for follow-up examination after completed treatment for conditions other than malignant neoplasm: Secondary | ICD-10-CM | POA: Diagnosis not present

## 2021-07-31 DIAGNOSIS — R531 Weakness: Secondary | ICD-10-CM | POA: Diagnosis not present

## 2021-07-31 DIAGNOSIS — E860 Dehydration: Secondary | ICD-10-CM | POA: Diagnosis not present

## 2021-07-31 DIAGNOSIS — R609 Edema, unspecified: Secondary | ICD-10-CM | POA: Diagnosis not present

## 2021-07-31 DIAGNOSIS — M545 Low back pain, unspecified: Secondary | ICD-10-CM | POA: Diagnosis not present

## 2021-07-31 DIAGNOSIS — E440 Moderate protein-calorie malnutrition: Secondary | ICD-10-CM | POA: Diagnosis not present

## 2021-08-01 ENCOUNTER — Ambulatory Visit (INDEPENDENT_AMBULATORY_CARE_PROVIDER_SITE_OTHER): Payer: PPO | Admitting: Internal Medicine

## 2021-08-01 ENCOUNTER — Other Ambulatory Visit: Payer: Self-pay

## 2021-08-01 ENCOUNTER — Encounter: Payer: Self-pay | Admitting: Internal Medicine

## 2021-08-01 VITALS — BP 128/72 | HR 93 | Ht 66.0 in | Wt 170.6 lb

## 2021-08-01 DIAGNOSIS — I2510 Atherosclerotic heart disease of native coronary artery without angina pectoris: Secondary | ICD-10-CM

## 2021-08-01 DIAGNOSIS — I10 Essential (primary) hypertension: Secondary | ICD-10-CM | POA: Diagnosis not present

## 2021-08-01 DIAGNOSIS — I480 Paroxysmal atrial fibrillation: Secondary | ICD-10-CM | POA: Insufficient documentation

## 2021-08-01 MED ORDER — AMIODARONE HCL 200 MG PO TABS: ORAL_TABLET | ORAL | 3 refills | Status: AC

## 2021-08-01 NOTE — Progress Notes (Signed)
HPI Justin Trujillo is referred back today by Dr. Tamala Julian for ongoing evaluation of atrial fib. He is a pleasant 75 yo man with DM, HTN, diastolic CHF, and esophageal CA. I saw him last over 3 years ago.  He was in the hospital with atrial fib and placed on amiodarone and Eliquis. He has severe back pain and lumbar disc disease and is pending surgical eval. He has not had angina but does note some peripheral edema. He had a low risk myoview 6 months ago. He is fairly sedentary and has lost weight. He is still smoking. The patient's nausea has improved. He has undergone palliative radiation. He c/o frequent urination at night, I suspect because he is taking lasix inadvertently at night time.  Allergies  Allergen Reactions   Iodinated Diagnostic Agents Anaphylaxis   Moxifloxacin Swelling    REACTION: GI upset, throat "tightened up"   Penicillins Anaphylaxis    Did it involve swelling of the face/tongue/throat, SOB, or low BP? Yes Did it involve sudden or severe rash/hives, skin peeling, or any reaction on the inside of your mouth or nose? No Did you need to seek medical attention at a hospital or doctor's office? Yes When did it last happen?   been a while     If all above answers are "NO", may proceed with cephalosporin use.    Shellfish-Derived Products Anaphylaxis   Amoxicillin Other (See Comments)   Chlorhexidine     Port not accessed   Other Other (See Comments)   Latex Rash    Over long periods of time     Current Outpatient Medications  Medication Sig Dispense Refill   acetaminophen (TYLENOL) 500 MG tablet Take 650 mg by mouth every 8 (eight) hours as needed for mild pain (pain).     albuterol (VENTOLIN HFA) 108 (90 Base) MCG/ACT inhaler Inhale 1 puff into the lungs every 6 (six) hours as needed for wheezing.     ALPRAZolam (XANAX) 0.5 MG tablet Take 0.5 mg by mouth 2 (two) times daily as needed for anxiety.     amiodarone (PACERONE) 200 MG tablet Take 2 tablets (400 mg total) by  mouth 2 (two) times daily x1 week, and then switch to 1 tablet (200 mg) twice daily 120 tablet 1   apixaban (ELIQUIS) 5 MG TABS tablet Take 1 tablet (5 mg total) by mouth 2 (two) times daily. 60 tablet 2   atorvastatin (LIPITOR) 40 MG tablet Take 1 tablet (40 mg total) by mouth daily. 30 tablet 0   Calcium Carb-Cholecalciferol (CALCIUM 600+D3 PO) Take 1 tablet by mouth daily.     Cholecalciferol (VITAMIN D) 50 MCG (2000 UT) tablet Take 2,000 Units by mouth daily.     escitalopram (LEXAPRO) 10 MG tablet Take 10 mg by mouth daily.     furosemide (LASIX) 40 MG tablet Take 1 tablet (40 mg total) by mouth daily. 30 tablet 1   gabapentin (NEURONTIN) 300 MG capsule Take 2 capsules (600 mg total) by mouth 3 (three) times daily. (Patient taking differently: Take 300 mg by mouth 3 (three) times daily.) 90 capsule 5   lidocaine-prilocaine (EMLA) cream Apply 1 application topically as needed. 30 g 0   magnesium oxide (MAG-OX) 400 MG tablet Take 1 tablet (400 mg total) by mouth daily. 14 tablet 0   Multiple Vitamins-Minerals (CENTRUM SILVER) tablet daily in the afternoon.     MYRBETRIQ 50 MG TB24 tablet Take 50 mg by mouth daily.  ondansetron (ZOFRAN ODT) 4 MG disintegrating tablet Take 1 tablet (4 mg total) by mouth every 8 (eight) hours as needed for nausea or vomiting. 20 tablet 0   oxyCODONE-acetaminophen (PERCOCET) 10-325 MG tablet Take 1 tablet by mouth every 6 (six) hours as needed.     pantoprazole (PROTONIX) 40 MG tablet Take 1 tablet (40 mg total) by mouth 2 (two) times daily. 60 tablet 2   potassium chloride SA (KLOR-CON) 20 MEQ tablet Take 2 tablets (40 mEq total) by mouth daily. 60 tablet 1   predniSONE (DELTASONE) 50 MG tablet Take 1 tablet 13 hours, 7 hours and 1 hour prior to CT Scan 3 tablet 0   primidone (MYSOLINE) 50 MG tablet Take 1 tablet (50 mg total) by mouth in the morning and at bedtime. 180 tablet 2   prochlorperazine (COMPAZINE) 10 MG tablet Take 1 tablet (10 mg total) by mouth  every 6 (six) hours as needed for nausea or vomiting. 30 tablet 0   rOPINIRole (REQUIP) 0.5 MG tablet Take 0.5 mg by mouth at bedtime.     Semaglutide,0.25 or 0.5MG /DOS, (OZEMPIC, 0.25 OR 0.5 MG/DOSE,) 2 MG/1.5ML SOPN Ozempic 0.25 mg or 0.5 mg (2 mg/1.5 mL) subcutaneous pen injector  INJECT 0.5 MG SUBCUTANEOUSLY ONCE WEEKLY     sucralfate (CARAFATE) 1 GM/10ML suspension Take 10 mLs (1 g total) by mouth 4 (four) times daily -  with meals and at bedtime. 420 mL 2   tamsulosin (FLOMAX) 0.4 MG CAPS capsule Take 0.4 mg by mouth daily.     TESTOSTERONE IM Inject 1 mL into the muscle every 14 (fourteen) days.     tiZANidine (ZANAFLEX) 4 MG tablet Take 4 mg by mouth 3 (three) times daily as needed.     trolamine salicylate (ASPERCREME) 10 % cream Apply 1 application topically 2 (two) times daily as needed for muscle pain.      No current facility-administered medications for this visit.     Past Medical History:  Diagnosis Date   Arthritis    Cancer ([place])    Coronary atherosclerosis of native coronary artery    Depression    Esophageal cancer (HCC)    Essential tremor    High cholesterol    Hypertension    Hypertension    Hypogonadism male    Obstructive chronic bronchitis with exacerbation (HCC)    Obstructive sleep apnea (adult) (pediatric)    no cpap   Other and unspecified hyperlipidemia    Other emphysema ([place])    Proteinuria 2019   has seen a nephrologist   Shortness of breath    with excertion   Situational stress    Type II or unspecified type diabetes mellitus without mention of complication, not stated as uncontrolled    type 2   Unspecified essential hypertension     ROS:   All systems reviewed and negative except as noted in the HPI.   Past Surgical History:  Procedure Laterality Date   CARDIAC CATHETERIZATION     IR IMAGING GUIDED PORT INSERTION  04/24/2021   IR KYPHO LUMBAR INC FX REDUCE BONE BX UNI/BIL CANNULATION INC/IMAGING  06/13/2018   L lens implant     R  knee replacement x2     REVERSE SHOULDER ARTHROPLASTY Left 03/26/2019   Procedure: REVERSE SHOULDER ARTHROPLASTY;  Surgeon: Justice Britain, MD;  Location: [facility];  Service: Orthopedics;  Laterality: Left;  147min   TONSILLECTOMY     TONSILLECTOMY AND ADENOIDECTOMY     vascectomy  Family History  Problem Relation Age of Onset   Emphysema Father    Heart disease Father    Clotting disorder Mother    Kidney cancer Child      Social History   Socioeconomic History   Marital status: Married    Spouse name: Not on file   Number of children: Not on file   Years of education: Not on file   Highest education level: Not on file  Occupational History   Occupation: Landscape architect  Tobacco Use   Smoking status: Every Day    Packs/day: 1.00    Years: 55.00    Pack years: 55.00    Types: Cigarettes   Smokeless tobacco: Never  Vaping Use   Vaping Use: Never used  Substance and Sexual Activity   Alcohol use: Yes    Alcohol/week: 1.0 standard drink    Types: 1 Cans of beer per week    Comment: occasional beer   Drug use: Never   Sexual activity: Not Currently  Other Topics Concern   Not on file  Social History Narrative   ** Merged History Encounter **       Social Determinants of Health   Financial Resource Strain: Not on file  Food Insecurity: Not on file  Transportation Needs: Not on file  Physical Activity: Not on file  Stress: Not on file  Social Connections: Not on file  Intimate Partner Violence: Not on file     BP 128/72   Pulse 93   Ht 5\' 6"  (1.676 m)   Wt 170 lb 9.6 oz (77.4 kg)   SpO2 98%   BMI 27.54 kg/m   Physical Exam:  Well appearing NAD HEENT: Unremarkable Neck:  No JVD, no thyromegally Lymphatics:  No adenopathy Back:  No CVA tenderness Lungs:  Clear with no wheezes HEART:  Regular rate rhythm, no murmurs, no rubs, no clicks Abd:  soft, positive bowel sounds, no organomegally, no rebound, no guarding Ext:  2 plus  pulses, no edema, no cyanosis, no clubbing Skin:  No rashes no nodules Neuro:  CN II through XII intact, motor grossly intact  EKG - nsr  Assess/Plan:  Atrial fib - he is maintaining NSR very nicely. He will take 400 bid of amio for 5 more days then 200 bid until he sees our PA back in 8 weeks. I would imagine reducing amio to 200 mg daily at that time. Possible back surgery - he is an acceptable surgical candidate with moderate risk. No additional testing required. He had a low risk myoview 6 months ago. Esophageal CA - note XRT. His long term prognosis is guarded. Peripheral edema - I asked him to take his lasix in the morning and avoid salty foods.  Carleene Overlie Chole Driver,MD

## 2021-08-01 NOTE — Patient Instructions (Addendum)
Medication Instructions:   Your physician has recommended you make the following change in your medication:    START taking your furosmide (Lasix) in the morning  2.  Take amiodarone 200 mg-   A.  Take 2 tablets by mouth twice a day for 7 more days THEN  B.  REDUCE to 1 tablet by mouth ONCE a day  Labwork: None ordered.  Testing/Procedures: None ordered.  Follow-Up: Your physician wants you to follow-up in: 8 weeks with  Tommye Standard, PA-C  Any Other Special Instructions Will Be Listed Below (If Applicable).  If you need a refill on your cardiac medications before your next appointment, please call your pharmacy.

## 2021-08-02 ENCOUNTER — Telehealth (HOSPITAL_COMMUNITY): Payer: Self-pay

## 2021-08-02 ENCOUNTER — Inpatient Hospital Stay: Payer: PPO

## 2021-08-02 ENCOUNTER — Other Ambulatory Visit: Payer: Self-pay

## 2021-08-02 ENCOUNTER — Other Ambulatory Visit (HOSPITAL_COMMUNITY): Payer: Self-pay

## 2021-08-02 ENCOUNTER — Inpatient Hospital Stay: Payer: PPO | Admitting: Dietician

## 2021-08-02 ENCOUNTER — Telehealth: Payer: Self-pay | Admitting: Internal Medicine

## 2021-08-02 ENCOUNTER — Inpatient Hospital Stay: Payer: PPO | Admitting: Physician Assistant

## 2021-08-02 ENCOUNTER — Inpatient Hospital Stay: Payer: PPO | Attending: Hematology and Oncology

## 2021-08-02 DIAGNOSIS — Z5111 Encounter for antineoplastic chemotherapy: Secondary | ICD-10-CM | POA: Diagnosis not present

## 2021-08-02 DIAGNOSIS — G4733 Obstructive sleep apnea (adult) (pediatric): Secondary | ICD-10-CM | POA: Insufficient documentation

## 2021-08-02 DIAGNOSIS — Z7901 Long term (current) use of anticoagulants: Secondary | ICD-10-CM | POA: Diagnosis not present

## 2021-08-02 DIAGNOSIS — Z923 Personal history of irradiation: Secondary | ICD-10-CM | POA: Insufficient documentation

## 2021-08-02 DIAGNOSIS — F17210 Nicotine dependence, cigarettes, uncomplicated: Secondary | ICD-10-CM | POA: Insufficient documentation

## 2021-08-02 DIAGNOSIS — I2510 Atherosclerotic heart disease of native coronary artery without angina pectoris: Secondary | ICD-10-CM | POA: Diagnosis not present

## 2021-08-02 DIAGNOSIS — E1140 Type 2 diabetes mellitus with diabetic neuropathy, unspecified: Secondary | ICD-10-CM | POA: Insufficient documentation

## 2021-08-02 DIAGNOSIS — F32A Depression, unspecified: Secondary | ICD-10-CM | POA: Diagnosis not present

## 2021-08-02 DIAGNOSIS — G250 Essential tremor: Secondary | ICD-10-CM | POA: Insufficient documentation

## 2021-08-02 DIAGNOSIS — I10 Essential (primary) hypertension: Secondary | ICD-10-CM | POA: Diagnosis not present

## 2021-08-02 DIAGNOSIS — Z5112 Encounter for antineoplastic immunotherapy: Secondary | ICD-10-CM | POA: Insufficient documentation

## 2021-08-02 DIAGNOSIS — J449 Chronic obstructive pulmonary disease, unspecified: Secondary | ICD-10-CM | POA: Insufficient documentation

## 2021-08-02 DIAGNOSIS — R1310 Dysphagia, unspecified: Secondary | ICD-10-CM | POA: Diagnosis not present

## 2021-08-02 DIAGNOSIS — Z95828 Presence of other vascular implants and grafts: Secondary | ICD-10-CM

## 2021-08-02 DIAGNOSIS — C155 Malignant neoplasm of lower third of esophagus: Secondary | ICD-10-CM | POA: Insufficient documentation

## 2021-08-02 DIAGNOSIS — E7800 Pure hypercholesterolemia, unspecified: Secondary | ICD-10-CM | POA: Insufficient documentation

## 2021-08-02 DIAGNOSIS — C792 Secondary malignant neoplasm of skin: Secondary | ICD-10-CM | POA: Diagnosis not present

## 2021-08-02 LAB — CBC WITH DIFFERENTIAL (CANCER CENTER ONLY)
Abs Immature Granulocytes: 0.11 10*3/uL — ABNORMAL HIGH (ref 0.00–0.07)
Basophils Absolute: 0.1 10*3/uL (ref 0.0–0.1)
Basophils Relative: 1 %
Eosinophils Absolute: 0 10*3/uL (ref 0.0–0.5)
Eosinophils Relative: 0 %
HCT: 32.5 % — ABNORMAL LOW (ref 39.0–52.0)
Hemoglobin: 10.6 g/dL — ABNORMAL LOW (ref 13.0–17.0)
Immature Granulocytes: 1 %
Lymphocytes Relative: 5 %
Lymphs Abs: 0.6 10*3/uL — ABNORMAL LOW (ref 0.7–4.0)
MCH: 32.6 pg (ref 26.0–34.0)
MCHC: 32.6 g/dL (ref 30.0–36.0)
MCV: 100 fL (ref 80.0–100.0)
Monocytes Absolute: 0.6 10*3/uL (ref 0.1–1.0)
Monocytes Relative: 6 %
Neutro Abs: 9.2 10*3/uL — ABNORMAL HIGH (ref 1.7–7.7)
Neutrophils Relative %: 87 %
Platelet Count: 148 10*3/uL — ABNORMAL LOW (ref 150–400)
RBC: 3.25 MIL/uL — ABNORMAL LOW (ref 4.22–5.81)
RDW: 20.3 % — ABNORMAL HIGH (ref 11.5–15.5)
WBC Count: 10.6 10*3/uL — ABNORMAL HIGH (ref 4.0–10.5)
nRBC: 0 % (ref 0.0–0.2)

## 2021-08-02 LAB — CMP (CANCER CENTER ONLY)
ALT: 18 U/L (ref 0–44)
AST: 21 U/L (ref 15–41)
Albumin: 2.6 g/dL — ABNORMAL LOW (ref 3.5–5.0)
Alkaline Phosphatase: 100 U/L (ref 38–126)
Anion gap: 8 (ref 5–15)
BUN: 12 mg/dL (ref 8–23)
CO2: 29 mmol/L (ref 22–32)
Calcium: 8.3 mg/dL — ABNORMAL LOW (ref 8.9–10.3)
Chloride: 100 mmol/L (ref 98–111)
Creatinine: 0.81 mg/dL (ref 0.61–1.24)
GFR, Estimated: >60 mL/min (ref >60)
Glucose, Bld: 118 mg/dL — ABNORMAL HIGH (ref 70–99)
Potassium: 3.7 mmol/L (ref 3.5–5.1)
Sodium: 137 mmol/L (ref 135–145)
Total Bilirubin: 0.3 mg/dL (ref 0.3–1.2)
Total Protein: 5.7 g/dL — ABNORMAL LOW (ref 6.5–8.1)

## 2021-08-02 MED ORDER — SODIUM CHLORIDE 0.9% FLUSH
10.0000 mL | Freq: Once | INTRAVENOUS | Status: AC
  Administered 2021-08-02: 10 mL

## 2021-08-02 NOTE — Telephone Encounter (Signed)
Pharmacy Transitions of Care Follow-up Telephone Call  Date of discharge: 07/21/21  Discharge Diagnosis: Afib  How have you been since you were released from the hospital?  Patient doing well since discharge, no questions about meds at this time.  Medication changes made at discharge:     START taking: Eliquis (apixaban)  furosemide (Lasix)  magnesium oxide (MAG-OX)  ondansetron (Zofran ODT)  pantoprazole (PROTONIX)  potassium chloride SA (KLOR-CON)  sucralfate (CARAFATE)  STOP taking: lisinopril 5 MG tablet (ZESTRIL)  ondansetron 8 MG tablet (ZOFRAN)  sulfamethoxazole-trimethoprim 800-160 MG tablet (BACTRIM DS)   Medication changes verified by the patient? Yes    Medication Accessibility:  Home Pharmacy:  [facility]   Was the patient provided with refills on discharged medications? Yes   Have all prescriptions been transferred from [facility] to home pharmacy?  Yes  Is the patient able to afford medications? Has insurance    Medication Review:  APIXABAN (ELIQUIS)  Apixaban 5 mg BID initiated on 07/21/21.  - Discussed importance of taking medication around the same time everyday  - Advised patient of medications to avoid (NSAIDs, ASA)  - Educated that Tylenol (acetaminophen) will be the preferred analgesic to prevent risk of bleeding  - Emphasized importance of monitoring for signs and symptoms of bleeding (abnormal bruising, prolonged bleeding, nose bleeds, bleeding from gums, discolored urine, black tarry stools)  - Advised patient to alert all providers of anticoagulation therapy prior to starting a new medication or having a procedure   Follow-up Appointments:  PCP Hospital f/u appt confirmed? None currently scheduled   Specialist Hospital f/u appt confirmed? Already seen by Dr. Lovena Le since discharge. Scheduled to see Dr. Charlcie Cradle on 09/26/21 @ 1:30pm.   If their condition worsens, is the pt aware to call PCP or go to the Emergency Dept.? yes  Final  Patient Assessment: Patient has follow up scheduled and refills at home pharmacy

## 2021-08-02 NOTE — Progress Notes (Signed)
Pt came in today for labs, infusion, and a provider appt. The pt asked the RN if he would be seeing his MD today. The pt was informed that he would be seeing Murray Hodgkins, [place] in infusion and he responded by saying "I am not taking treatment today. I have been told by two doctors that I am took weak for chemo."  This RN informed the pt that she would reach out to [place], [place] regarding this information. While this RN was waiting for a response from the PA, the pt decided he would not wait for his 14:00 appt with Murray Hodgkins, [place]. He stated "You can take this thing out of me and let me go, my cardiologist and my family doctor say I am too weak for chemo". This RN informed the pt that Murray Hodgkins could see him earlier at 13:30 and assured him that he would just sit in the infusion chair and wait to see his provider, but pt still refused to stay. This RN then deaccessed the pt's PAC per pt request. This RN informed Murray Hodgkins, [place] that pt would be leaving without seeing her. This RN then informed the pt that he would be rescheduled to see Dr. Lorenso Courier.  Pt was ambulatory to the lobby and was OK with waiting to see Dr. Lorenso Courier at his next appt on 08/15/2021.

## 2021-08-02 NOTE — Telephone Encounter (Signed)
New Message:  Patient wants to find out from Dr Lovena Le if it is alright for him to drive?

## 2021-08-02 NOTE — Progress Notes (Signed)
Patient did not stay for visit. Cancelled visit.

## 2021-08-04 ENCOUNTER — Inpatient Hospital Stay: Payer: PPO

## 2021-08-05 DIAGNOSIS — R1310 Dysphagia, unspecified: Secondary | ICD-10-CM | POA: Diagnosis not present

## 2021-08-05 DIAGNOSIS — Z7901 Long term (current) use of anticoagulants: Secondary | ICD-10-CM | POA: Diagnosis not present

## 2021-08-05 DIAGNOSIS — Z79891 Long term (current) use of opiate analgesic: Secondary | ICD-10-CM | POA: Diagnosis not present

## 2021-08-05 DIAGNOSIS — F17210 Nicotine dependence, cigarettes, uncomplicated: Secondary | ICD-10-CM | POA: Diagnosis not present

## 2021-08-05 DIAGNOSIS — Z96611 Presence of right artificial shoulder joint: Secondary | ICD-10-CM | POA: Diagnosis not present

## 2021-08-05 DIAGNOSIS — G894 Chronic pain syndrome: Secondary | ICD-10-CM | POA: Diagnosis not present

## 2021-08-05 DIAGNOSIS — N200 Calculus of kidney: Secondary | ICD-10-CM | POA: Diagnosis not present

## 2021-08-05 DIAGNOSIS — J449 Chronic obstructive pulmonary disease, unspecified: Secondary | ICD-10-CM | POA: Diagnosis not present

## 2021-08-05 DIAGNOSIS — G250 Essential tremor: Secondary | ICD-10-CM | POA: Diagnosis not present

## 2021-08-05 DIAGNOSIS — Z96651 Presence of right artificial knee joint: Secondary | ICD-10-CM | POA: Diagnosis not present

## 2021-08-05 DIAGNOSIS — G4700 Insomnia, unspecified: Secondary | ICD-10-CM | POA: Diagnosis not present

## 2021-08-05 DIAGNOSIS — I2510 Atherosclerotic heart disease of native coronary artery without angina pectoris: Secondary | ICD-10-CM | POA: Diagnosis not present

## 2021-08-05 DIAGNOSIS — E291 Testicular hypofunction: Secondary | ICD-10-CM | POA: Diagnosis not present

## 2021-08-05 DIAGNOSIS — E440 Moderate protein-calorie malnutrition: Secondary | ICD-10-CM | POA: Diagnosis not present

## 2021-08-05 DIAGNOSIS — M5440 Lumbago with sciatica, unspecified side: Secondary | ICD-10-CM | POA: Diagnosis not present

## 2021-08-05 DIAGNOSIS — E7800 Pure hypercholesterolemia, unspecified: Secondary | ICD-10-CM | POA: Diagnosis not present

## 2021-08-05 DIAGNOSIS — N181 Chronic kidney disease, stage 1: Secondary | ICD-10-CM | POA: Diagnosis not present

## 2021-08-05 DIAGNOSIS — G2581 Restless legs syndrome: Secondary | ICD-10-CM | POA: Diagnosis not present

## 2021-08-05 DIAGNOSIS — I129 Hypertensive chronic kidney disease with stage 1 through stage 4 chronic kidney disease, or unspecified chronic kidney disease: Secondary | ICD-10-CM | POA: Diagnosis not present

## 2021-08-05 DIAGNOSIS — F324 Major depressive disorder, single episode, in partial remission: Secondary | ICD-10-CM | POA: Diagnosis not present

## 2021-08-05 DIAGNOSIS — F439 Reaction to severe stress, unspecified: Secondary | ICD-10-CM | POA: Diagnosis not present

## 2021-08-05 DIAGNOSIS — E1122 Type 2 diabetes mellitus with diabetic chronic kidney disease: Secondary | ICD-10-CM | POA: Diagnosis not present

## 2021-08-05 DIAGNOSIS — Z792 Long term (current) use of antibiotics: Secondary | ICD-10-CM | POA: Diagnosis not present

## 2021-08-05 DIAGNOSIS — Z6837 Body mass index (BMI) 37.0-37.9, adult: Secondary | ICD-10-CM | POA: Diagnosis not present

## 2021-08-07 DIAGNOSIS — E46 Unspecified protein-calorie malnutrition: Secondary | ICD-10-CM | POA: Diagnosis not present

## 2021-08-07 DIAGNOSIS — M4150 Other secondary scoliosis, site unspecified: Secondary | ICD-10-CM | POA: Diagnosis not present

## 2021-08-07 NOTE — Telephone Encounter (Signed)
Ok to drive 

## 2021-08-08 NOTE — Telephone Encounter (Signed)
Left detailed message informing patient OK to drive per Dr. Lovena Le. Advised to call office back if he had further questions/concerns related to this.

## 2021-08-09 DIAGNOSIS — Z6837 Body mass index (BMI) 37.0-37.9, adult: Secondary | ICD-10-CM | POA: Diagnosis not present

## 2021-08-09 DIAGNOSIS — Z79891 Long term (current) use of opiate analgesic: Secondary | ICD-10-CM | POA: Diagnosis not present

## 2021-08-09 DIAGNOSIS — J449 Chronic obstructive pulmonary disease, unspecified: Secondary | ICD-10-CM | POA: Diagnosis not present

## 2021-08-09 DIAGNOSIS — R1310 Dysphagia, unspecified: Secondary | ICD-10-CM | POA: Diagnosis not present

## 2021-08-09 DIAGNOSIS — Z96611 Presence of right artificial shoulder joint: Secondary | ICD-10-CM | POA: Diagnosis not present

## 2021-08-09 DIAGNOSIS — F17210 Nicotine dependence, cigarettes, uncomplicated: Secondary | ICD-10-CM | POA: Diagnosis not present

## 2021-08-09 DIAGNOSIS — N181 Chronic kidney disease, stage 1: Secondary | ICD-10-CM | POA: Diagnosis not present

## 2021-08-09 DIAGNOSIS — Z792 Long term (current) use of antibiotics: Secondary | ICD-10-CM | POA: Diagnosis not present

## 2021-08-09 DIAGNOSIS — Z96651 Presence of right artificial knee joint: Secondary | ICD-10-CM | POA: Diagnosis not present

## 2021-08-09 DIAGNOSIS — N200 Calculus of kidney: Secondary | ICD-10-CM | POA: Diagnosis not present

## 2021-08-09 DIAGNOSIS — E7800 Pure hypercholesterolemia, unspecified: Secondary | ICD-10-CM | POA: Diagnosis not present

## 2021-08-09 DIAGNOSIS — Z7901 Long term (current) use of anticoagulants: Secondary | ICD-10-CM | POA: Diagnosis not present

## 2021-08-09 DIAGNOSIS — F439 Reaction to severe stress, unspecified: Secondary | ICD-10-CM | POA: Diagnosis not present

## 2021-08-09 DIAGNOSIS — F324 Major depressive disorder, single episode, in partial remission: Secondary | ICD-10-CM | POA: Diagnosis not present

## 2021-08-09 DIAGNOSIS — E1122 Type 2 diabetes mellitus with diabetic chronic kidney disease: Secondary | ICD-10-CM | POA: Diagnosis not present

## 2021-08-09 DIAGNOSIS — M5440 Lumbago with sciatica, unspecified side: Secondary | ICD-10-CM | POA: Diagnosis not present

## 2021-08-09 DIAGNOSIS — G4700 Insomnia, unspecified: Secondary | ICD-10-CM | POA: Diagnosis not present

## 2021-08-09 DIAGNOSIS — G894 Chronic pain syndrome: Secondary | ICD-10-CM | POA: Diagnosis not present

## 2021-08-09 DIAGNOSIS — I129 Hypertensive chronic kidney disease with stage 1 through stage 4 chronic kidney disease, or unspecified chronic kidney disease: Secondary | ICD-10-CM | POA: Diagnosis not present

## 2021-08-09 DIAGNOSIS — E291 Testicular hypofunction: Secondary | ICD-10-CM | POA: Diagnosis not present

## 2021-08-09 DIAGNOSIS — E440 Moderate protein-calorie malnutrition: Secondary | ICD-10-CM | POA: Diagnosis not present

## 2021-08-09 DIAGNOSIS — I2510 Atherosclerotic heart disease of native coronary artery without angina pectoris: Secondary | ICD-10-CM | POA: Diagnosis not present

## 2021-08-09 DIAGNOSIS — G250 Essential tremor: Secondary | ICD-10-CM | POA: Diagnosis not present

## 2021-08-09 DIAGNOSIS — G2581 Restless legs syndrome: Secondary | ICD-10-CM | POA: Diagnosis not present

## 2021-08-14 ENCOUNTER — Telehealth: Payer: Self-pay | Admitting: *Deleted

## 2021-08-14 ENCOUNTER — Other Ambulatory Visit: Payer: Self-pay | Admitting: Physician Assistant

## 2021-08-14 DIAGNOSIS — C155 Malignant neoplasm of lower third of esophagus: Secondary | ICD-10-CM

## 2021-08-14 NOTE — Telephone Encounter (Signed)
   Name: Justin Trujillo.  DOB: 31-Jan-1946  MRN: [id number]   Primary Cardiologist: Justin Peru, MD  Chart reviewed as part of pre-operative protocol coverage. Patient was contacted 08/14/2021 in reference to pre-operative risk assessment for pending surgery as outlined below.  Justin Trujillo. was last seen on 08/01/21 by Dr. Lovena Trujillo.   Per Dr. Lovena Trujillo: He is an acceptable surgical candidate with moderate risk. No additional testing required. He had a low risk myoview 6 months ago.  Per our clinical pharmacist: Per office protocol, patient can hold Eliquis for 3 days prior to procedure.   Patient will not need bridging with Lovenox (enoxaparin) around procedure.  Therefore, based on ACC/AHA guidelines, the patient would be at acceptable risk for the planned procedure without further cardiovascular testing.   The patient was advised that if he develops new symptoms prior to surgery to contact our office to arrange for a follow-up visit, and he verbalized understanding.  I will route this recommendation to the requesting party via Epic fax function and remove from pre-op pool. Please call with questions.  Justin Bottcher, PA 08/14/2021, 6:34 PM

## 2021-08-14 NOTE — Telephone Encounter (Signed)
   Pre-operative Risk Assessment    Patient Name: Justin Trujillo.  DOB: 02/12/1946 MRN: [id number]      Request for Surgical Clearance   Procedure:   LUMBAR EPIDURAL STEROID INJECTION  Date of Surgery: Clearance TBD                                 Surgeon:  DR. Kristeen Miss Surgeon's Group or Practice Name:  [facility] Phone number:  [phone] ATTN: VANESSA EXT [id number] Fax number:  [phone] ATTN: Lorriane Shire   Type of Clearance Requested: - Medical  - Pharmacy:  Hold Apixaban (Eliquis)     Type of Anesthesia:   IV SEDATION   Additional requests/questions:   Jiles Prows   08/14/2021, 12:24 PM

## 2021-08-14 NOTE — Telephone Encounter (Signed)
Patient with diagnosis of atrial fibrillation on Eliquis for anticoagulation.    Procedure: lumbar ESI Date of procedure: TBD   CHA2DS2-VASc Score = 5   This indicates a 7.2% annual risk of stroke. The patient's score is based upon: CHF History: 1 HTN History: 1 Diabetes History: 1 Stroke History: 0 Vascular Disease History: 0 Age Score: 2 Gender Score: 0      CrCl 76 (with adjusted body weight) Platelet count 148  Per office protocol, patient can hold Eliquis for 3 days prior to procedure.   Patient will not need bridging with Lovenox (enoxaparin) around procedure.

## 2021-08-15 ENCOUNTER — Inpatient Hospital Stay: Payer: PPO

## 2021-08-15 ENCOUNTER — Inpatient Hospital Stay: Payer: PPO | Admitting: Physician Assistant

## 2021-08-15 ENCOUNTER — Inpatient Hospital Stay: Payer: PPO | Admitting: Dietician

## 2021-08-15 ENCOUNTER — Other Ambulatory Visit: Payer: Self-pay | Admitting: Hematology and Oncology

## 2021-08-15 ENCOUNTER — Other Ambulatory Visit: Payer: Self-pay

## 2021-08-15 ENCOUNTER — Inpatient Hospital Stay (HOSPITAL_BASED_OUTPATIENT_CLINIC_OR_DEPARTMENT_OTHER): Payer: PPO | Admitting: Physician Assistant

## 2021-08-15 VITALS — BP 122/73 | HR 90 | Temp 97.5°F | Resp 18 | Wt 174.9 lb

## 2021-08-15 DIAGNOSIS — Z95828 Presence of other vascular implants and grafts: Secondary | ICD-10-CM

## 2021-08-15 DIAGNOSIS — C155 Malignant neoplasm of lower third of esophagus: Secondary | ICD-10-CM | POA: Diagnosis not present

## 2021-08-15 DIAGNOSIS — C153 Malignant neoplasm of upper third of esophagus: Secondary | ICD-10-CM | POA: Diagnosis not present

## 2021-08-15 DIAGNOSIS — Z5111 Encounter for antineoplastic chemotherapy: Secondary | ICD-10-CM | POA: Diagnosis not present

## 2021-08-15 LAB — CMP (CANCER CENTER ONLY)
ALT: 15 U/L (ref 0–44)
AST: 23 U/L (ref 15–41)
Albumin: 3.1 g/dL — ABNORMAL LOW (ref 3.5–5.0)
Alkaline Phosphatase: 93 U/L (ref 38–126)
Anion gap: 8 (ref 5–15)
BUN: 14 mg/dL (ref 8–23)
CO2: 28 mmol/L (ref 22–32)
Calcium: 8.9 mg/dL (ref 8.9–10.3)
Chloride: 99 mmol/L (ref 98–111)
Creatinine: 0.8 mg/dL (ref 0.61–1.24)
GFR, Estimated: >60 mL/min (ref >60)
Glucose, Bld: 123 mg/dL — ABNORMAL HIGH (ref 70–99)
Potassium: 3.6 mmol/L (ref 3.5–5.1)
Sodium: 135 mmol/L (ref 135–145)
Total Bilirubin: 0.3 mg/dL (ref 0.3–1.2)
Total Protein: 6.5 g/dL (ref 6.5–8.1)

## 2021-08-15 LAB — CBC WITH DIFFERENTIAL (CANCER CENTER ONLY)
Abs Immature Granulocytes: 0.04 10*3/uL (ref 0.00–0.07)
Basophils Absolute: 0.1 10*3/uL (ref 0.0–0.1)
Basophils Relative: 1 %
Eosinophils Absolute: 0.1 10*3/uL (ref 0.0–0.5)
Eosinophils Relative: 2 %
HCT: 34.2 % — ABNORMAL LOW (ref 39.0–52.0)
Hemoglobin: 11 g/dL — ABNORMAL LOW (ref 13.0–17.0)
Immature Granulocytes: 1 %
Lymphocytes Relative: 10 %
Lymphs Abs: 0.7 10*3/uL (ref 0.7–4.0)
MCH: 32.7 pg (ref 26.0–34.0)
MCHC: 32.2 g/dL (ref 30.0–36.0)
MCV: 101.8 fL — ABNORMAL HIGH (ref 80.0–100.0)
Monocytes Absolute: 0.6 10*3/uL (ref 0.1–1.0)
Monocytes Relative: 9 %
Neutro Abs: 5.4 10*3/uL (ref 1.7–7.7)
Neutrophils Relative %: 77 %
Platelet Count: 226 10*3/uL (ref 150–400)
RBC: 3.36 MIL/uL — ABNORMAL LOW (ref 4.22–5.81)
RDW: 17.1 % — ABNORMAL HIGH (ref 11.5–15.5)
WBC Count: 6.9 10*3/uL (ref 4.0–10.5)
nRBC: 0 % (ref 0.0–0.2)

## 2021-08-15 LAB — TSH: TSH: 1.177 u[IU]/mL (ref 0.350–4.500)

## 2021-08-15 MED ORDER — DEXTROSE 5 % IV SOLN: Freq: Once | INTRAVENOUS | Status: AC

## 2021-08-15 MED ORDER — OXALIPLATIN CHEMO INJECTION 100 MG/20ML
85.0000 mg/m2 | Freq: Once | INTRAVENOUS | Status: AC
  Administered 2021-08-15: 170 mg via INTRAVENOUS
  Filled 2021-08-15: qty 34

## 2021-08-15 MED ORDER — SODIUM CHLORIDE 0.9 % IV SOLN
5000.0000 mg | INTRAVENOUS | Status: DC
  Administered 2021-08-15: 5000 mg via INTRAVENOUS
  Filled 2021-08-15: qty 100

## 2021-08-15 MED ORDER — PALONOSETRON HCL INJECTION 0.25 MG/5ML
0.2500 mg | Freq: Once | INTRAVENOUS | Status: AC
  Administered 2021-08-15: 0.25 mg via INTRAVENOUS

## 2021-08-15 MED ORDER — FLUOROURACIL CHEMO INJECTION 2.5 GM/50ML
400.0000 mg/m2 | Freq: Once | INTRAVENOUS | Status: AC
  Administered 2021-08-15: 800 mg via INTRAVENOUS
  Filled 2021-08-15: qty 16

## 2021-08-15 MED ORDER — SODIUM CHLORIDE 0.9 % IV SOLN
240.0000 mg | Freq: Once | INTRAVENOUS | Status: AC
  Administered 2021-08-15: 240 mg via INTRAVENOUS
  Filled 2021-08-15: qty 24

## 2021-08-15 MED ORDER — SODIUM CHLORIDE 0.9% FLUSH: 10.0000 mL | INTRAVENOUS | Status: DC | PRN

## 2021-08-15 MED ORDER — SODIUM CHLORIDE 0.9% FLUSH
10.0000 mL | Freq: Once | INTRAVENOUS | Status: AC
  Administered 2021-08-15: 10 mL

## 2021-08-15 MED ORDER — SODIUM CHLORIDE 0.9 % IV SOLN
10.0000 mg | Freq: Once | INTRAVENOUS | Status: AC
  Administered 2021-08-15: 10 mg via INTRAVENOUS
  Filled 2021-08-15: qty 10

## 2021-08-15 MED ORDER — HEPARIN SOD (PORK) LOCK FLUSH 100 UNIT/ML IV SOLN: 500.0000 [IU] | Freq: Once | INTRAVENOUS | Status: DC | PRN

## 2021-08-15 MED ORDER — LEUCOVORIN CALCIUM INJECTION 350 MG
400.0000 mg/m2 | Freq: Once | INTRAVENOUS | Status: AC
  Administered 2021-08-15: 808 mg via INTRAVENOUS
  Filled 2021-08-15: qty 25

## 2021-08-15 NOTE — Progress Notes (Signed)
Nutrition Follow-up:  Patient receiving FOLFOX, Nivolumab for metastatic esophageal cancer. He completed radiation therapy on 7/20.  Met with patient during infusion. He reports improved appetite, recalls typically having 2 meals with snacks in between and drinking 2 Boost VHC daily. Patient eating "anything he wants." He denies swallowing difficulties as long as he remembers to take his time. Patient denies nausea, vomiting, diarrhea, constipation. He continues to have severe lower back pain. Patient is receiving home PT/OT a couple times a week. He reports completing prescribed exercises twice daily.   Medications: reviewed   Labs: Glucose 123  Anthropometrics: Weight 174 lb 14.4 oz today increased from 170 lb 9.6 oz on 11/15  10/19 - 175 lb 4.8 oz 10/6 - 176 lb 3 oz   NUTRITION DIAGNOSIS: Inadequate oral intake improved    INTERVENTION:  Continue strategies for increasing calories and protein with small frequent meals and snacks Continue drinking 2 Boost VHC daily - coupons provided Provided support and encouragement Patient has contact information     MONITORING, EVALUATION, GOAL: weight trends, intake   NEXT VISIT: To be scheduled as needed with treatment

## 2021-08-15 NOTE — Patient Instructions (Signed)
[facility]   Discharge Instructions: Thank you for choosing [facility] to provide your oncology and hematology care.   If you have a lab appointment with the [facility], please go directly to the [facility] and check in at the registration area.   Wear comfortable clothing and clothing appropriate for easy access to any Portacath or PICC line.   We strive to give you quality time with your provider. You may need to reschedule your appointment if you arrive late (15 or more minutes).  Arriving late affects you and other patients whose appointments are after yours.  Also, if you miss three or more appointments without notifying the office, you may be dismissed from the clinic at the provider's discretion.      For prescription refill requests, have your pharmacy contact our office and allow 72 hours for refills to be completed.    Today you received the following chemotherapy and/or immunotherapy agents: nivolumab, oxaliplatin, leucovorin, fluorouracil.      To help prevent nausea and vomiting after your treatment, we encourage you to take your nausea medication as directed.  BELOW ARE SYMPTOMS THAT SHOULD BE REPORTED IMMEDIATELY: *FEVER GREATER THAN 100.4 F (38 C) OR HIGHER *CHILLS OR SWEATING *NAUSEA AND VOMITING THAT IS NOT CONTROLLED WITH YOUR NAUSEA MEDICATION *UNUSUAL SHORTNESS OF BREATH *UNUSUAL BRUISING OR BLEEDING *URINARY PROBLEMS (pain or burning when urinating, or frequent urination) *BOWEL PROBLEMS (unusual diarrhea, constipation, pain near the anus) TENDERNESS IN MOUTH AND THROAT WITH OR WITHOUT PRESENCE OF ULCERS (sore throat, sores in mouth, or a toothache) UNUSUAL RASH, SWELLING OR PAIN  UNUSUAL VAGINAL DISCHARGE OR ITCHING   Items with * indicate a potential emergency and should be followed up as soon as possible or go to the Emergency Department if any problems should occur.  Please show the CHEMOTHERAPY ALERT CARD  or IMMUNOTHERAPY ALERT CARD at check-in to the Emergency Department and triage nurse.  Should you have questions after your visit or need to cancel or reschedule your appointment, please contact [facility]  Dept: [phone]  and follow the prompts.  Office hours are 8:00 a.m. to 4:30 p.m. Monday - Friday. Please note that voicemails left after 4:00 p.m. may not be returned until the following business day.  We are closed weekends and major holidays. You have access to a nurse at all times for urgent questions. Please call the main number to the clinic Dept: [phone] and follow the prompts.   For any non-urgent questions, you may also contact your provider using MyChart. We now offer e-Visits for anyone 27 and older to request care online for non-urgent symptoms. For details visit [email or web address].   Also download the MyChart app! Go to the app store, search "MyChart", open the app, select [facility], and log in with your MyChart username and password.  Due to Covid, a mask is required upon entering the hospital/clinic. If you do not have a mask, one will be given to you upon arrival. For doctor visits, patients may have 1 support person aged 15 or older with them. For treatment visits, patients cannot have anyone with them due to current Covid guidelines and our immunocompromised population.

## 2021-08-15 NOTE — Progress Notes (Signed)
[place] Telephone:(336) [phone]   Fax:(336) [phone]  PROGRESS NOTE  Patient Care Team: Carol Ada, MD as PCP - General (Family Medicine) Evans Lance, MD as PCP - Cardiology (Cardiology) Orson Slick, MD as Consulting Physician (Hematology and Oncology)  Hematological/Oncological History # Metastatic Adenocarcinoma of the Distal Esophagus 01/27/2021: dermatology performed a punch biopsy of the right superior lateral anterior neck. Finding show adenocarcinoma negative for PSA and cytokeratin 20.  02/06/2021: establish care with Dr. Lorenso Courier  02/15/2021: EGD found esophageal mass. Biopsy confirms poorly differentiated carcinoma 02/22/2021: PET CT scan shows FDG avid mass in distal esophagus and subcutaneous tissue or right neck.  03/22/2021-04/05/2021: palliative radiation to the esophageal mass.  04/26/2021: Cycle 1 Day 1 of FOLFOX + Nivolumab 05/10/2021: Cycle 2 Day 1 of FOLFOX + Nivolumab 05/24/2021: Cycle 3 Day 1 of FOLFOX + Nivolumab 06/07/2021: Cycle 4 Day 1 of FOLFOX + Nivolumab 06/23/2021: Cycle 5 Day 1 of FOLFOX + Nivolumab 07/05/2021: Cycle 6 Day 1 of FOLFOX + Nivolumab 08/15/2021: Cycle 7 Day 1 of FOLFOX + Nivolumab (delayed due to hospitalization for N/V and dehydration)  Interval History:  Justin Trujillo. 75 y.o. male with medical history significant for metastatic esophageal adenocarcinoma who presents for a follow up visit. The patient's last visit was on 07/05/2021. In the interim, Justin Trujillo was hospitalized from 07/15/2021-07/21/2021 for recurrent nausea/vomiting.   On exam today Justin Trujillo reports that his energy levels have improved slightly since hospital discharge.  He is more active and can ambulate using a cane.  His appetite is improved and he denies any nausea or vomiting at this time.  Patient reports worsening low back pain since leaving the hospital.  His bowel habits are regular without any diarrhea or constipation.  He denies any  hematochezia or melena.  Patient reports occasional episodes of mild nosebleeds that stopped on its own.  He denies any dysphagia unless he eats too fast or too large of a bite.  He denies a odynophagia or regurgitation.  He is currently taking doxycycline for cellulitis of the leg.  Patient reports bilateral lower extremity edema after leaving the hospital.  He is currently taking Lasix once a day with mild improvement.  He denies any open sores or purulent discharge.  He reports mild neuropathy in the feet without any interference to balance.  He denies any fevers, chills, night sweats, shortness of breath, chest pain or cough. He has no other complaints. Rest of the 10 point ROS is below.  MEDICAL HISTORY:  Past Medical History:  Diagnosis Date   Arthritis    Cancer ([place])    Coronary atherosclerosis of native coronary artery    Depression    Esophageal cancer (HCC)    Essential tremor    High cholesterol    Hypertension    Hypertension    Hypogonadism male    Obstructive chronic bronchitis with exacerbation (HCC)    Obstructive sleep apnea (adult) (pediatric)    no cpap   Other and unspecified hyperlipidemia    Other emphysema ([place])    Proteinuria 2019   has seen a nephrologist   Shortness of breath    with excertion   Situational stress    Type II or unspecified type diabetes mellitus without mention of complication, not stated as uncontrolled    type 2   Unspecified essential hypertension     SURGICAL HISTORY: Past Surgical History:  Procedure Laterality Date   CARDIAC CATHETERIZATION  IR IMAGING GUIDED PORT INSERTION  04/24/2021   IR KYPHO LUMBAR INC FX REDUCE BONE BX UNI/BIL CANNULATION INC/IMAGING  06/13/2018   L lens implant     R knee replacement x2     REVERSE SHOULDER ARTHROPLASTY Left 03/26/2019   Procedure: REVERSE SHOULDER ARTHROPLASTY;  Surgeon: Justice Britain, MD;  Location: [facility];  Service: Orthopedics;  Laterality: Left;  155mn   TONSILLECTOMY      TONSILLECTOMY AND ADENOIDECTOMY     vascectomy      SOCIAL HISTORY: Social History   Socioeconomic History   Marital status: Married    Spouse name: Not on file   Number of children: Not on file   Years of education: Not on file   Highest education level: Not on file  Occupational History   Occupation: lLandscape architect Tobacco Use   Smoking status: Every Day    Packs/day: 1.00    Years: 55.00    Pack years: 55.00    Types: Cigarettes   Smokeless tobacco: Never  Vaping Use   Vaping Use: Never used  Substance and Sexual Activity   Alcohol use: Yes    Alcohol/week: 1.0 standard drink    Types: 1 Cans of beer per week    Comment: occasional beer   Drug use: Never   Sexual activity: Not Currently  Other Topics Concern   Not on file  Social History Narrative   ** Merged History Encounter **       Social Determinants of Health   Financial Resource Strain: Not on file  Food Insecurity: Not on file  Transportation Needs: Not on file  Physical Activity: Not on file  Stress: Not on file  Social Connections: Not on file  Intimate Partner Violence: Not on file    FAMILY HISTORY: Family History  Problem Relation Age of Onset   Emphysema Father    Heart disease Father    Clotting disorder Mother    Kidney cancer Child     ALLERGIES:  is allergic to iodinated diagnostic agents, moxifloxacin, penicillins, shellfish-derived products, amoxicillin, chlorhexidine, other, and latex.  MEDICATIONS:  Current Outpatient Medications  Medication Sig Dispense Refill   acetaminophen (TYLENOL) 500 MG tablet Take 650 mg by mouth every 8 (eight) hours as needed for mild pain (pain).     albuterol (VENTOLIN HFA) 108 (90 Base) MCG/ACT inhaler Inhale 1 puff into the lungs every 6 (six) hours as needed for wheezing.     ALPRAZolam (XANAX) 0.5 MG tablet Take 0.5 mg by mouth 2 (two) times daily as needed for anxiety.     amiodarone (PACERONE) 200 MG tablet Take 2 tablets  (400 mg total) by mouth 2 (two) times daily for 7 days, THEN 1 tablet (200 mg total) daily. 90 tablet 3   apixaban (ELIQUIS) 5 MG TABS tablet Take 1 tablet (5 mg total) by mouth 2 (two) times daily. 60 tablet 2   atorvastatin (LIPITOR) 40 MG tablet Take 1 tablet (40 mg total) by mouth daily. 30 tablet 0   Calcium Carb-Cholecalciferol (CALCIUM 600+D3 PO) Take 1 tablet by mouth daily.     Cholecalciferol (VITAMIN D) 50 MCG (2000 UT) tablet Take 2,000 Units by mouth daily.     doxycycline (VIBRA-TABS) 100 MG tablet Take by mouth.     escitalopram (LEXAPRO) 10 MG tablet Take 10 mg by mouth daily.     furosemide (LASIX) 40 MG tablet Take 1 tablet (40 mg total) by mouth daily. 30 tablet 1   gabapentin (  NEURONTIN) 300 MG capsule Take 2 capsules (600 mg total) by mouth 3 (three) times daily. (Patient taking differently: Take 300 mg by mouth 3 (three) times daily.) 90 capsule 5   lidocaine-prilocaine (EMLA) cream Apply 1 application topically as needed. 30 g 0   magnesium oxide (MAG-OX) 400 MG tablet Take 1 tablet (400 mg total) by mouth daily. 14 tablet 0   Multiple Vitamins-Minerals (CENTRUM SILVER) tablet daily in the afternoon.     MYRBETRIQ 50 MG TB24 tablet Take 50 mg by mouth daily.     ondansetron (ZOFRAN ODT) 4 MG disintegrating tablet Take 1 tablet (4 mg total) by mouth every 8 (eight) hours as needed for nausea or vomiting. 20 tablet 0   oxyCODONE-acetaminophen (PERCOCET) 10-325 MG tablet Take 1 tablet by mouth every 6 (six) hours as needed.     pantoprazole (PROTONIX) 40 MG tablet Take 1 tablet (40 mg total) by mouth 2 (two) times daily. 60 tablet 2   potassium chloride SA (KLOR-CON) 20 MEQ tablet Take 2 tablets (40 mEq total) by mouth daily. 60 tablet 1   predniSONE (DELTASONE) 50 MG tablet Take 1 tablet 13 hours, 7 hours and 1 hour prior to CT Scan 3 tablet 0   primidone (MYSOLINE) 50 MG tablet Take 1 tablet (50 mg total) by mouth in the morning and at bedtime. 180 tablet 2   prochlorperazine  (COMPAZINE) 10 MG tablet Take 1 tablet (10 mg total) by mouth every 6 (six) hours as needed for nausea or vomiting. 30 tablet 0   rOPINIRole (REQUIP) 0.5 MG tablet Take 0.5 mg by mouth at bedtime.     Semaglutide,0.25 or 0.5MG/DOS, (OZEMPIC, 0.25 OR 0.5 MG/DOSE,) 2 MG/1.5ML SOPN Ozempic 0.25 mg or 0.5 mg (2 mg/1.5 mL) subcutaneous pen injector  INJECT 0.5 MG SUBCUTANEOUSLY ONCE WEEKLY     sucralfate (CARAFATE) 1 GM/10ML suspension Take 10 mLs (1 g total) by mouth 4 (four) times daily -  with meals and at bedtime. 420 mL 2   tamsulosin (FLOMAX) 0.4 MG CAPS capsule Take 0.4 mg by mouth daily.     TESTOSTERONE IM Inject 1 mL into the muscle every 14 (fourteen) days.     tiZANidine (ZANAFLEX) 4 MG tablet Take 4 mg by mouth 3 (three) times daily as needed.     trolamine salicylate (ASPERCREME) 10 % cream Apply 1 application topically 2 (two) times daily as needed for muscle pain.      No current facility-administered medications for this visit.   Facility-Administered Medications Ordered in Other Visits  Medication Dose Route Frequency Provider Last Rate Last Admin   fluorouracil (ADRUCIL) 5,000 mg in sodium chloride 0.9 % 150 mL chemo infusion  5,000 mg Intravenous 1 day or 1 dose Ledell Peoples IV, MD   5,000 mg at 08/15/21 1645   heparin lock flush 100 unit/mL  500 Units Intracatheter Once PRN Orson Slick, MD       sodium chloride flush (NS) 0.9 % injection 10 mL  10 mL Intracatheter PRN Orson Slick, MD        REVIEW OF SYSTEMS:   Constitutional: ( - ) fevers, ( - )  chills , ( - ) night sweats Eyes: ( - ) blurriness of vision, ( - ) double vision, ( - ) watery eyes Ears, nose, mouth, throat, and face: ( - ) mucositis, ( - ) sore throat Respiratory: ( - ) cough, ( - ) dyspnea, ( - ) wheezes Cardiovascular: ( - )  palpitation, ( - ) chest discomfort, ( + ) lower extremity swelling Gastrointestinal:  ( - ) nausea, ( - ) heartburn, ( - ) change in bowel habits Skin: ( - ) abnormal  skin rashes Lymphatics: ( - ) new lymphadenopathy, ( - ) easy bruising Neurological: ( - ) numbness, ( - ) tingling, ( - ) new weaknesses Behavioral/Psych: ( - ) mood change, ( - ) new changes  All other systems were reviewed with the patient and are negative.  PHYSICAL EXAMINATION: ECOG PERFORMANCE STATUS: 1 - Symptomatic but completely ambulatory  Vitals:   08/15/21 1206  BP: 122/73  Pulse: 90  Resp: 18  Temp: (!) 97.5 F (36.4 C)  SpO2: 98%   Filed Weights   08/15/21 1206  Weight: 174 lb 14.4 oz (79.3 kg)    GENERAL: Well-appearing elderly Caucasian male, alert, no distress and comfortable SKIN: skin color, texture, turgor are normal, no rashes or significant lesions EYES: conjunctiva are pink and non-injected, sclera clear LUNGS: clear to auscultation and percussion with normal breathing effort HEART: regular rate & rhythm and no murmurs. Bilateral pitting edema of the lower legs bilaterally.  PSYCH: alert & oriented x 3, fluent speech NEURO: no focal motor/sensory deficits  LABORATORY DATA:  I have reviewed the data as listed CBC Latest Ref Rng & Units 08/15/2021 08/02/2021 07/21/2021  WBC 4.0 - 10.5 K/uL 6.9 10.6(H) 25.8(H)  Hemoglobin 13.0 - 17.0 g/dL 11.0(L) 10.6(L) 11.0(L)  Hematocrit 39.0 - 52.0 % 34.2(L) 32.5(L) 32.5(L)  Platelets 150 - 400 K/uL 226 148(L) 98(L)    CMP Latest Ref Rng & Units 08/15/2021 08/02/2021 07/21/2021  Glucose 70 - 99 mg/dL 123(H) 118(H) 90  BUN 8 - 23 mg/dL _0 Creatinine 0.61 - 1.24 mg/dL 0.80 0.81 0.73  Sodium 135 - 145 mmol/L 135 137 131(L)  Potassium 3.5 - 5.1 mmol/L 3.6 3.7 3.3(L)  Chloride 98 - 111 mmol/L 99 100 100  CO2 22 - 32 mmol/L _1 Calcium 8.9 - 10.3 mg/dL 8.9 8.3(L) 8.0(L)  Total Protein 6.5 - 8.1 g/dL 6.5 5.7(L) 4.3(L)  Total Bilirubin 0.3 - 1.2 mg/dL 0.3 0.3 0.6  Alkaline Phos 38 - 126 U/L 93 100 89  AST 15 - 41 U/L _2 ALT 0 - 44 U/L _3 RADIOGRAPHIC STUDIES: I have personally reviewed  the radiological images as listed and agreed with the findings in the report: FDG avid distal esophagus as well as subcutaneous none lymph node region in the right neck. ECHOCARDIOGRAM COMPLETE  Result Date: 07/18/2021    ECHOCARDIOGRAM REPORT   Patient Name:   Justin Trujillo. Date of Exam: 07/18/2021 Medical Rec #:  [id number]          Height:       66.0 in Accession #:    [id number]         Weight:       170.0 lb Date of Birth:  22-Oct-1945          BSA:          1.866 m Patient Age:    19 years           BP:           127/79 mmHg Patient Gender: M                  HR:           114 bpm. Exam Location:  Inpatient Procedure: 2D Echo, Cardiac Doppler and Color Doppler Indications:    I48.1 Persistent atrial fibrillation  History:        Patient has no prior history of Echocardiogram examinations.                 CAD, COPD, Signs/Symptoms:Dyspnea and Chest Pain; Risk                 Factors:Diabetes and Dyslipidemia.  Sonographer:    Glo Herring Referring Phys: [place]  1. LVEF appears at least mild to moderately depressed with hypokinesis of the distal half of the LV WOuld recomm Limited echo with Definity to confirm LVEF and wall motion. . The left ventricle has no regional wall motion abnormalities. Left ventricular  diastolic parameters are indeterminate.  2. Right ventricular systolic function is low normal. The right ventricular size is normal.  3. Trivial mitral valve regurgitation. Moderate mitral annular calcification.  4. Aortic valve regurgitation is not visualized. Mild to moderate aortic valve sclerosis/calcification is present, without any evidence of aortic stenosis.  5. The inferior vena cava is normal in size with greater than 50% respiratory variability, suggesting right atrial pressure of 3 mmHg. FINDINGS  Left Ventricle: LVEF appears at least mild to moderately depressed with hypokinesis of the distal half of the LV WOuld recomm Limited echo with Definity to  confirm LVEF and wall motion. The left ventricle has no regional wall motion abnormalities. The left ventricular internal cavity size was normal in size. There is no left ventricular hypertrophy. Left ventricular diastolic parameters are indeterminate. Right Ventricle: The right ventricular size is normal. Right vetricular wall thickness was not assessed. Right ventricular systolic function is low normal. Left Atrium: Left atrial size was normal in size. Right Atrium: Right atrial size was normal in size. Pericardium: There is no evidence of pericardial effusion. Mitral Valve: There is mild thickening of the mitral valve leaflet(s). Moderate mitral annular calcification. Trivial mitral valve regurgitation. Tricuspid Valve: The tricuspid valve is normal in structure. Tricuspid valve regurgitation is trivial. Aortic Valve: Aortic valve regurgitation is not visualized. Mild to moderate aortic valve sclerosis/calcification is present, without any evidence of aortic stenosis. Aortic valve mean gradient measures 6.7 mmHg. Aortic valve peak gradient measures 16.2 mmHg. Pulmonic Valve: The pulmonic valve was not well visualized. Pulmonic valve regurgitation is not visualized. No evidence of pulmonic stenosis. Aorta: The aortic root is normal in size and structure. Venous: The inferior vena cava is normal in size with greater than 50% respiratory variability, suggesting right atrial pressure of 3 mmHg. IAS/Shunts: No atrial level shunt detected by color flow Doppler.  LEFT VENTRICLE PLAX 2D LVIDd:         3.50 cm LVIDs:         2.50 cm LV PW:         1.10 cm LV IVS:        1.00 cm  RIGHT VENTRICLE          IVC RV Basal diam:  3.90 cm  IVC diam: 1.80 cm LEFT ATRIUM           Index        RIGHT ATRIUM           Index LA diam:      3.80 cm 2.04 cm/m   RA Area:     17.60 cm LA Vol (A4C): 53.1 ml 28.45 ml/m  RA Volume:   41.80 ml  22.40 ml/m  AORTIC VALVE  PULMONIC VALVE AV Vmax:           201.33 cm/s  PV  Vmax:       1.32 m/s AV Vmean:          117.667 cm/s PV Peak grad:  7.0 mmHg AV VTI:            0.255 m AV Peak Grad:      16.2 mmHg AV Mean Grad:      6.7 mmHg LVOT Vmax:         90.62 cm/s LVOT Vmean:        58.950 cm/s LVOT VTI:          0.128 m LVOT/AV VTI ratio: 0.50  AORTA Ao Root diam: 3.60 cm  SHUNTS Systemic VTI: 0.13 m Dorris Carnes MD Electronically signed by Dorris Carnes MD Signature Date/Time: 07/18/2021/7:57:11 PM    Final    ECHOCARDIOGRAM LIMITED  Result Date: 07/19/2021    ECHOCARDIOGRAM LIMITED REPORT   Patient Name:   Justin Trujillo. Date of Exam: 07/19/2021 Medical Rec #:  [id number]          Height:       66.0 in Accession #:    [id number]         Weight:       170.0 lb Date of Birth:  01-25-1946          BSA:          1.866 m Patient Age:    47 years           BP:           122/73 mmHg Patient Gender: M                  HR:           108 bpm. Exam Location:  Inpatient Procedure: Limited Echo and Intracardiac Opacification Agent Indications:    Assess LVF w/ Definity  History:        Patient has prior history of Echocardiogram examinations, most                 recent 07/18/2021. CAD, COPD, Signs/Symptoms:Dyspnea and Chest                 Pain; Risk Factors:Diabetes, Hypertension and Dyslipidemia.  Sonographer:    Melissa Morford RDCS (AE, PE) Referring Phys: [place]  1. LV function is hyperdynamic EF 65-70%; Normal basal and mid segments. However the apex is akinetic indicating a possible stress cardiomyopathy vs. distal LAD disease. No LV thrombus. Recommend EKG and serial troponin levels. FINDINGS  Left Ventricle: LV function is hyperdynamic EF 65-70%; Normal basal and mid segments. However the apex is akinetic indicating a possible stress cardiomyopathy vs. distal LAD disease. No LV thrombus. Recommend EKG and serial troponin levels. Phineas Inches Electronically signed by Phineas Inches Signature Date/Time: 07/19/2021/2:18:03 PM    Final    DG ESOPHAGUS W SINGLE CM (SOL  OR THIN BA)  Result Date: 07/17/2021 CLINICAL DATA:  History of esophageal cancer with nausea and vomiting. EXAM: ESOPHAGUS/BARIUM SWALLOW TECHNIQUE: Single contrast examination was performed using thin liquid barium. This exam was performed by Soyla Dryer, and was supervised and interpreted by Abigail Miyamoto. FLUOROSCOPY TIME:  Radiation Exposure Index (as provided by the fluoroscopic device): Not applicable. If the device does not provide the exposure index: Fluoroscopy Time:  1 minute and 36 seconds Number of Acquired Images:  None COMPARISON:  Chest CT  07/13/2021 FINDINGS: Focused exam was performed with the patient supine and in various obliquities. Focused single-contrast exam performed. Full column evaluation of the esophagus demonstrates no persistent narrowing or stricture. Spontaneous gastroesophageal reflux is identified to the level of the clavicles. A 13 mm barium tablet passes promptly. IMPRESSION: Single-contrast, focused exam performed as detailed above. No esophageal stricture. Spontaneous gastroesophageal reflux. Electronically Signed   By: Abigail Miyamoto M.D.   On: 07/17/2021 09:48    ASSESSMENT & PLAN EION TIMBROOK 75 y.o. male with medical history significant for metastatic esophageal adenocarcinoma who presents for a follow up visit. The patient is negative for HER2 but has a high TPS score.  Therefore he is an excellent candidate for FOLFOX with nivolumab.    The regimen of FOLFOX with nivolumab will be administered q 2 weeks with clinic visits prior to each infusion. This will be continued until progression or intolerance.   #Metastatic Adenocarcinoma of the Esophagus #Dysphagia -- Underwent palliative radiation in order to help alleviate his dysphagia. Patient now tolerating PO well.  --Unfortunately given the spread of this cancer to the skin we will need to treat this as metastatic disease with systemic therapy  -- Final testing on the tissue resulted with HER2 negative  status, but TPS score of 60%.  Therefore he would be a good candidate for FOLFOX with nivolumab --Started Cycle 1 Day 1 of FOLFOX plus Nivolumab on 04/26/2021 with good tolerance.  --Restaging CT Scan form 07/13/2021 shows stable soft tissue thickening of the esophagus. No evidence of progression.  --Labs today reviewed and adequate for treatment. WBC 6.9, Hgb stable at 11.0, Plt 226K. TSH levels are normal. CMP reviewed and requires no intervention. --Recommend to proceed with Cycle 7 Day 1 today.  --RTC in 2 weeks for Cycle 8.   #Supportive Care -- chemotherapy education complete -- port placed -- zofran 23m q8H PRN and compazine 158mPO q6H for nausea -- EMLA cream for port -- HOLD on placing PEG tube as he is currently tolerating PO remarkably well.   No orders of the defined types were placed in this encounter.   All questions were answered. The patient knows to call the clinic with any problems, questions or concerns.  I have spent a total of 30 minutes minutes of face-to-face and non-face-to-face time, preparing to see the patient, performing a medically appropriate examination, counseling and educating the patient, documenting clinical information in the electronic health record, and care coordination.   IrLincoln BrighamPA-C Department of Hematology/Oncology [facility] [place]: [phone] 08/15/2021 4:50 PM

## 2021-08-17 ENCOUNTER — Other Ambulatory Visit: Payer: Self-pay

## 2021-08-17 ENCOUNTER — Inpatient Hospital Stay: Payer: PPO | Attending: Hematology and Oncology

## 2021-08-17 ENCOUNTER — Inpatient Hospital Stay: Payer: PPO

## 2021-08-17 ENCOUNTER — Inpatient Hospital Stay: Payer: PPO | Admitting: Hematology and Oncology

## 2021-08-17 VITALS — BP 101/67 | HR 87 | Temp 98.3°F | Resp 20

## 2021-08-17 DIAGNOSIS — C155 Malignant neoplasm of lower third of esophagus: Secondary | ICD-10-CM | POA: Insufficient documentation

## 2021-08-17 DIAGNOSIS — F17210 Nicotine dependence, cigarettes, uncomplicated: Secondary | ICD-10-CM | POA: Insufficient documentation

## 2021-08-17 DIAGNOSIS — Z79899 Other long term (current) drug therapy: Secondary | ICD-10-CM | POA: Diagnosis not present

## 2021-08-17 DIAGNOSIS — C792 Secondary malignant neoplasm of skin: Secondary | ICD-10-CM | POA: Insufficient documentation

## 2021-08-17 DIAGNOSIS — Z5111 Encounter for antineoplastic chemotherapy: Secondary | ICD-10-CM | POA: Insufficient documentation

## 2021-08-17 DIAGNOSIS — Z5112 Encounter for antineoplastic immunotherapy: Secondary | ICD-10-CM | POA: Insufficient documentation

## 2021-08-17 MED ORDER — HEPARIN SOD (PORK) LOCK FLUSH 100 UNIT/ML IV SOLN
500.0000 [IU] | Freq: Once | INTRAVENOUS | Status: AC | PRN
  Administered 2021-08-17: 500 [IU]

## 2021-08-17 MED ORDER — SODIUM CHLORIDE 0.9% FLUSH
10.0000 mL | INTRAVENOUS | Status: DC | PRN
  Administered 2021-08-17: 10 mL

## 2021-08-18 DIAGNOSIS — Z79891 Long term (current) use of opiate analgesic: Secondary | ICD-10-CM | POA: Diagnosis not present

## 2021-08-18 DIAGNOSIS — I2510 Atherosclerotic heart disease of native coronary artery without angina pectoris: Secondary | ICD-10-CM | POA: Diagnosis not present

## 2021-08-18 DIAGNOSIS — F17210 Nicotine dependence, cigarettes, uncomplicated: Secondary | ICD-10-CM | POA: Diagnosis not present

## 2021-08-18 DIAGNOSIS — E440 Moderate protein-calorie malnutrition: Secondary | ICD-10-CM | POA: Diagnosis not present

## 2021-08-18 DIAGNOSIS — Z7901 Long term (current) use of anticoagulants: Secondary | ICD-10-CM | POA: Diagnosis not present

## 2021-08-18 DIAGNOSIS — M5440 Lumbago with sciatica, unspecified side: Secondary | ICD-10-CM | POA: Diagnosis not present

## 2021-08-18 DIAGNOSIS — I129 Hypertensive chronic kidney disease with stage 1 through stage 4 chronic kidney disease, or unspecified chronic kidney disease: Secondary | ICD-10-CM | POA: Diagnosis not present

## 2021-08-18 DIAGNOSIS — G4700 Insomnia, unspecified: Secondary | ICD-10-CM | POA: Diagnosis not present

## 2021-08-18 DIAGNOSIS — N181 Chronic kidney disease, stage 1: Secondary | ICD-10-CM | POA: Diagnosis not present

## 2021-08-18 DIAGNOSIS — E7800 Pure hypercholesterolemia, unspecified: Secondary | ICD-10-CM | POA: Diagnosis not present

## 2021-08-18 DIAGNOSIS — Z792 Long term (current) use of antibiotics: Secondary | ICD-10-CM | POA: Diagnosis not present

## 2021-08-18 DIAGNOSIS — E291 Testicular hypofunction: Secondary | ICD-10-CM | POA: Diagnosis not present

## 2021-08-18 DIAGNOSIS — G894 Chronic pain syndrome: Secondary | ICD-10-CM | POA: Diagnosis not present

## 2021-08-18 DIAGNOSIS — N200 Calculus of kidney: Secondary | ICD-10-CM | POA: Diagnosis not present

## 2021-08-18 DIAGNOSIS — F439 Reaction to severe stress, unspecified: Secondary | ICD-10-CM | POA: Diagnosis not present

## 2021-08-18 DIAGNOSIS — R1310 Dysphagia, unspecified: Secondary | ICD-10-CM | POA: Diagnosis not present

## 2021-08-18 DIAGNOSIS — G250 Essential tremor: Secondary | ICD-10-CM | POA: Diagnosis not present

## 2021-08-18 DIAGNOSIS — Z6837 Body mass index (BMI) 37.0-37.9, adult: Secondary | ICD-10-CM | POA: Diagnosis not present

## 2021-08-18 DIAGNOSIS — G2581 Restless legs syndrome: Secondary | ICD-10-CM | POA: Diagnosis not present

## 2021-08-18 DIAGNOSIS — F324 Major depressive disorder, single episode, in partial remission: Secondary | ICD-10-CM | POA: Diagnosis not present

## 2021-08-18 DIAGNOSIS — Z96651 Presence of right artificial knee joint: Secondary | ICD-10-CM | POA: Diagnosis not present

## 2021-08-18 DIAGNOSIS — J449 Chronic obstructive pulmonary disease, unspecified: Secondary | ICD-10-CM | POA: Diagnosis not present

## 2021-08-18 DIAGNOSIS — E1122 Type 2 diabetes mellitus with diabetic chronic kidney disease: Secondary | ICD-10-CM | POA: Diagnosis not present

## 2021-08-24 ENCOUNTER — Other Ambulatory Visit: Payer: Self-pay | Admitting: Hematology and Oncology

## 2021-08-24 ENCOUNTER — Ambulatory Visit: Payer: PPO | Admitting: Internal Medicine

## 2021-08-24 DIAGNOSIS — M5416 Radiculopathy, lumbar region: Secondary | ICD-10-CM | POA: Diagnosis not present

## 2021-08-24 DIAGNOSIS — M5116 Intervertebral disc disorders with radiculopathy, lumbar region: Secondary | ICD-10-CM | POA: Diagnosis not present

## 2021-08-26 DIAGNOSIS — C153 Malignant neoplasm of upper third of esophagus: Secondary | ICD-10-CM | POA: Diagnosis not present

## 2021-08-28 ENCOUNTER — Other Ambulatory Visit: Payer: Self-pay | Admitting: Physician Assistant

## 2021-08-28 DIAGNOSIS — C155 Malignant neoplasm of lower third of esophagus: Secondary | ICD-10-CM

## 2021-08-29 ENCOUNTER — Inpatient Hospital Stay: Payer: PPO

## 2021-08-29 ENCOUNTER — Inpatient Hospital Stay (HOSPITAL_BASED_OUTPATIENT_CLINIC_OR_DEPARTMENT_OTHER): Payer: PPO | Admitting: Physician Assistant

## 2021-08-29 ENCOUNTER — Other Ambulatory Visit: Payer: Self-pay

## 2021-08-29 VITALS — BP 155/89 | HR 86 | Temp 97.8°F | Resp 17 | Wt 172.4 lb

## 2021-08-29 DIAGNOSIS — Z5112 Encounter for antineoplastic immunotherapy: Secondary | ICD-10-CM | POA: Diagnosis not present

## 2021-08-29 DIAGNOSIS — C155 Malignant neoplasm of lower third of esophagus: Secondary | ICD-10-CM

## 2021-08-29 DIAGNOSIS — Z95828 Presence of other vascular implants and grafts: Secondary | ICD-10-CM

## 2021-08-29 DIAGNOSIS — C153 Malignant neoplasm of upper third of esophagus: Secondary | ICD-10-CM | POA: Diagnosis not present

## 2021-08-29 LAB — CMP (CANCER CENTER ONLY)
ALT: 17 U/L (ref 0–44)
AST: 21 U/L (ref 15–41)
Albumin: 3.3 g/dL — ABNORMAL LOW (ref 3.5–5.0)
Alkaline Phosphatase: 86 U/L (ref 38–126)
Anion gap: 7 (ref 5–15)
BUN: 14 mg/dL (ref 8–23)
CO2: 26 mmol/L (ref 22–32)
Calcium: 8.9 mg/dL (ref 8.9–10.3)
Chloride: 101 mmol/L (ref 98–111)
Creatinine: 0.74 mg/dL (ref 0.61–1.24)
GFR, Estimated: >60 mL/min (ref >60)
Glucose, Bld: 85 mg/dL (ref 70–99)
Potassium: 4.1 mmol/L (ref 3.5–5.1)
Sodium: 134 mmol/L — ABNORMAL LOW (ref 135–145)
Total Bilirubin: 0.5 mg/dL (ref 0.3–1.2)
Total Protein: 6.3 g/dL — ABNORMAL LOW (ref 6.5–8.1)

## 2021-08-29 LAB — CBC WITH DIFFERENTIAL (CANCER CENTER ONLY)
Abs Immature Granulocytes: 0.02 10*3/uL (ref 0.00–0.07)
Basophils Absolute: 0 10*3/uL (ref 0.0–0.1)
Basophils Relative: 0 %
Eosinophils Absolute: 0 10*3/uL (ref 0.0–0.5)
Eosinophils Relative: 1 %
HCT: 32.6 % — ABNORMAL LOW (ref 39.0–52.0)
Hemoglobin: 10.6 g/dL — ABNORMAL LOW (ref 13.0–17.0)
Immature Granulocytes: 0 %
Lymphocytes Relative: 11 %
Lymphs Abs: 0.7 10*3/uL (ref 0.7–4.0)
MCH: 32.8 pg (ref 26.0–34.0)
MCHC: 32.5 g/dL (ref 30.0–36.0)
MCV: 100.9 fL — ABNORMAL HIGH (ref 80.0–100.0)
Monocytes Absolute: 0.7 10*3/uL (ref 0.1–1.0)
Monocytes Relative: 10 %
Neutro Abs: 4.9 10*3/uL (ref 1.7–7.7)
Neutrophils Relative %: 78 %
Platelet Count: 173 10*3/uL (ref 150–400)
RBC: 3.23 MIL/uL — ABNORMAL LOW (ref 4.22–5.81)
RDW: 15.9 % — ABNORMAL HIGH (ref 11.5–15.5)
WBC Count: 6.3 10*3/uL (ref 4.0–10.5)
nRBC: 0 % (ref 0.0–0.2)

## 2021-08-29 LAB — TSH: TSH: 0.776 u[IU]/mL (ref 0.320–4.118)

## 2021-08-29 MED ORDER — SODIUM CHLORIDE 0.9 % IV SOLN
5000.0000 mg | INTRAVENOUS | Status: DC
  Administered 2021-08-29: 5000 mg via INTRAVENOUS
  Filled 2021-08-29: qty 100

## 2021-08-29 MED ORDER — DEXTROSE 5 % IV SOLN: Freq: Once | INTRAVENOUS | Status: AC

## 2021-08-29 MED ORDER — PALONOSETRON HCL INJECTION 0.25 MG/5ML
0.2500 mg | Freq: Once | INTRAVENOUS | Status: AC
  Administered 2021-08-29: 0.25 mg via INTRAVENOUS
  Filled 2021-08-29: qty 5

## 2021-08-29 MED ORDER — SODIUM CHLORIDE 0.9 % IV SOLN
240.0000 mg | Freq: Once | INTRAVENOUS | Status: AC
  Administered 2021-08-29: 240 mg via INTRAVENOUS
  Filled 2021-08-29: qty 24

## 2021-08-29 MED ORDER — SODIUM CHLORIDE 0.9% FLUSH
10.0000 mL | Freq: Once | INTRAVENOUS | Status: AC
Start: 2021-08-29 — End: 2021-08-29
  Administered 2021-08-29: 10 mL

## 2021-08-29 MED ORDER — SODIUM CHLORIDE 0.9 % IV SOLN
10.0000 mg | Freq: Once | INTRAVENOUS | Status: AC
  Administered 2021-08-29: 10 mg via INTRAVENOUS
  Filled 2021-08-29: qty 10

## 2021-08-29 MED ORDER — FLUOROURACIL CHEMO INJECTION 2.5 GM/50ML
400.0000 mg/m2 | Freq: Once | INTRAVENOUS | Status: AC
  Administered 2021-08-29: 800 mg via INTRAVENOUS
  Filled 2021-08-29: qty 16

## 2021-08-29 MED ORDER — OXALIPLATIN CHEMO INJECTION 100 MG/20ML
85.0000 mg/m2 | Freq: Once | INTRAVENOUS | Status: AC
  Administered 2021-08-29: 170 mg via INTRAVENOUS
  Filled 2021-08-29: qty 34

## 2021-08-29 MED ORDER — LEUCOVORIN CALCIUM INJECTION 350 MG
400.0000 mg/m2 | Freq: Once | INTRAVENOUS | Status: AC
  Administered 2021-08-29: 808 mg via INTRAVENOUS
  Filled 2021-08-29: qty 25

## 2021-08-29 NOTE — Patient Instructions (Signed)
[facility]   Discharge Instructions: Thank you for choosing [facility] to provide your oncology and hematology care.   If you have a lab appointment with the [facility], please go directly to the [facility] and check in at the registration area.   Wear comfortable clothing and clothing appropriate for easy access to any Portacath or PICC line.   We strive to give you quality time with your provider. You may need to reschedule your appointment if you arrive late (15 or more minutes).  Arriving late affects you and other patients whose appointments are after yours.  Also, if you miss three or more appointments without notifying the office, you may be dismissed from the clinic at the providers discretion.      For prescription refill requests, have your pharmacy contact our office and allow 72 hours for refills to be completed.    Today you received the following chemotherapy and/or immunotherapy agents: nivolumab, oxaliplatin, leucovorin, fluorouracil.      To help prevent nausea and vomiting after your treatment, we encourage you to take your nausea medication as directed.  BELOW ARE SYMPTOMS THAT SHOULD BE REPORTED IMMEDIATELY: *FEVER GREATER THAN 100.4 F (38 C) OR HIGHER *CHILLS OR SWEATING *NAUSEA AND VOMITING THAT IS NOT CONTROLLED WITH YOUR NAUSEA MEDICATION *UNUSUAL SHORTNESS OF BREATH *UNUSUAL BRUISING OR BLEEDING *URINARY PROBLEMS (pain or burning when urinating, or frequent urination) *BOWEL PROBLEMS (unusual diarrhea, constipation, pain near the anus) TENDERNESS IN MOUTH AND THROAT WITH OR WITHOUT PRESENCE OF ULCERS (sore throat, sores in mouth, or a toothache) UNUSUAL RASH, SWELLING OR PAIN  UNUSUAL VAGINAL DISCHARGE OR ITCHING   Items with * indicate a potential emergency and should be followed up as soon as possible or go to the Emergency Department if any problems should occur.  Please show the CHEMOTHERAPY ALERT CARD  or IMMUNOTHERAPY ALERT CARD at check-in to the Emergency Department and triage nurse.  Should you have questions after your visit or need to cancel or reschedule your appointment, please contact [facility]  Dept: [phone]  and follow the prompts.  Office hours are 8:00 a.m. to 4:30 p.m. Monday - Friday. Please note that voicemails left after 4:00 p.m. may not be returned until the following business day.  We are closed weekends and major holidays. You have access to a nurse at all times for urgent questions. Please call the main number to the clinic Dept: [phone] and follow the prompts.   For any non-urgent questions, you may also contact your provider using MyChart. We now offer e-Visits for anyone 86 and older to request care online for non-urgent symptoms. For details visit [email or web address].   Also download the MyChart app! Go to the app store, search "MyChart", open the app, select [facility], and log in with your MyChart username and password.  Due to Covid, a mask is required upon entering the hospital/clinic. If you do not have a mask, one will be given to you upon arrival. For doctor visits, patients may have 1 support person aged 36 or older with them. For treatment visits, patients cannot have anyone with them due to current Covid guidelines and our immunocompromised population.

## 2021-08-29 NOTE — Progress Notes (Signed)
[place] [phone]   [phone]) [phone]  PROGRESS NOTE  Patient Care Team: Carol Ada, MD as PCP - General (Family Medicine) Evans Lance, MD as PCP - Cardiology (Cardiology) Orson Slick, MD as Consulting Physician (Hematology and Oncology)  Hematological/Oncological History # Metastatic Adenocarcinoma of the Distal Esophagus 01/27/2021: dermatology performed a punch biopsy of the right superior lateral anterior neck. Finding show adenocarcinoma negative for PSA and cytokeratin 20.  02/06/2021: establish care with Dr. Lorenso Courier  02/15/2021: EGD found esophageal mass. Biopsy confirms poorly differentiated carcinoma 02/22/2021: PET CT scan shows FDG avid mass in distal esophagus and subcutaneous tissue or right neck.  03/22/2021-04/05/2021: palliative radiation to the esophageal mass.  04/26/2021: Cycle 1 Day 1 of FOLFOX + Nivolumab 05/10/2021: Cycle 2 Day 1 of FOLFOX + Nivolumab 05/24/2021: Cycle 3 Day 1 of FOLFOX + Nivolumab 06/07/2021: Cycle 4 Day 1 of FOLFOX + Nivolumab 06/23/2021: Cycle 5 Day 1 of FOLFOX + Nivolumab 07/05/2021: Cycle 6 Day 1 of FOLFOX + Nivolumab 08/15/2021: Cycle 7 Day 1 of FOLFOX + Nivolumab (delayed due to hospitalization for N/V and dehydration) 08/29/2021: Cycle 8 Day 1 of FOLFOX + Nivolumab   Interval History:  Justin Trujillo. 75 y.o. male with medical history significant for metastatic esophageal adenocarcinoma who presents for a follow up visit. The patient's last visit was on 08/15/2021. In the interim, Mr. Imperato was completed cycle 7.   On exam today Mr. Corker reports that his energy levels overall stable since resuming chemotherapy two weeks ago. He reports worsening low back pain and rates pain as 5-6 out of 10 on a pain scale. He continues to take percocet and gabapentin with improvement of pain. His appetite is unchanged and denies any dysphagia unless he eats too fast or too large of a bite. He reports constipation  secondary to pain medication. He is taking miralax with his coffee every couple of days which improves his symptoms. He denies easy bruising or signs of bleeding. He reports improvement of lower extremity edema and completed course of doxycycline for cellulitis of the left leg. He reports that cold sensitivity has lasted longer, approximately 12 days since the last treatment. He denies any residual neuropathy. He continues to have shortness of breath with exertion secondary to chronic cigarette use. He denies fevers, chills, night sweats, chest pain, cough, mucositis or rash. He has no other complaints. Rest of the 10 point ROS is below.  MEDICAL HISTORY:  Past Medical History:  Diagnosis Date   Arthritis    Cancer ([place])    Coronary atherosclerosis of native coronary artery    Depression    Esophageal cancer (HCC)    Essential tremor    High cholesterol    Hypertension    Hypertension    Hypogonadism male    Obstructive chronic bronchitis with exacerbation (HCC)    Obstructive sleep apnea (adult) (pediatric)    no cpap   Other and unspecified hyperlipidemia    Other emphysema ([place])    Proteinuria 2019   has seen a nephrologist   Shortness of breath    with excertion   Situational stress    Type II or unspecified type diabetes mellitus without mention of complication, not stated as uncontrolled    type 2   Unspecified essential hypertension     SURGICAL HISTORY: Past Surgical History:  Procedure Laterality Date   CARDIAC CATHETERIZATION     IR IMAGING GUIDED PORT INSERTION  04/24/2021   IR KYPHO  LUMBAR INC FX REDUCE BONE BX UNI/BIL CANNULATION INC/IMAGING  06/13/2018   L lens implant     R knee replacement x2     REVERSE SHOULDER ARTHROPLASTY Left 03/26/2019   Procedure: REVERSE SHOULDER ARTHROPLASTY;  Surgeon: Justice Britain, MD;  Location: [facility];  Service: Orthopedics;  Laterality: Left;  155mn   TONSILLECTOMY     TONSILLECTOMY AND ADENOIDECTOMY     vascectomy      SOCIAL  HISTORY: Social History   Socioeconomic History   Marital status: Married    Spouse name: Not on file   Number of children: Not on file   Years of education: Not on file   Highest education level: Not on file  Occupational History   Occupation: lLandscape architect Tobacco Use   Smoking status: Every Day    Packs/day: 1.00    Years: 55.00    Pack years: 55.00    Types: Cigarettes   Smokeless tobacco: Never  Vaping Use   Vaping Use: Never used  Substance and Sexual Activity   Alcohol use: Yes    Alcohol/week: 1.0 standard drink    Types: 1 Cans of beer per week    Comment: occasional beer   Drug use: Never   Sexual activity: Not Currently  Other Topics Concern   Not on file  Social History Narrative   ** Merged History Encounter **       Social Determinants of Health   Financial Resource Strain: Not on file  Food Insecurity: Not on file  Transportation Needs: Not on file  Physical Activity: Not on file  Stress: Not on file  Social Connections: Not on file  Intimate Partner Violence: Not on file    FAMILY HISTORY: Family History  Problem Relation Age of Onset   Emphysema Father    Heart disease Father    Clotting disorder Mother    Kidney cancer Child     ALLERGIES:  is allergic to iodinated diagnostic agents, moxifloxacin, penicillins, shellfish-derived products, amoxicillin, chlorhexidine, other, and latex.  MEDICATIONS:  Current Outpatient Medications  Medication Sig Dispense Refill   acetaminophen (TYLENOL) 500 MG tablet Take 650 mg by mouth every 8 (eight) hours as needed for mild pain (pain).     albuterol (VENTOLIN HFA) 108 (90 Base) MCG/ACT inhaler Inhale 1 puff into the lungs every 6 (six) hours as needed for wheezing.     ALPRAZolam (XANAX) 0.5 MG tablet Take 0.5 mg by mouth 2 (two) times daily as needed for anxiety.     amiodarone (PACERONE) 200 MG tablet Take 2 tablets (400 mg total) by mouth 2 (two) times daily for 7 days, THEN 1  tablet (200 mg total) daily. 90 tablet 3   apixaban (ELIQUIS) 5 MG TABS tablet Take 1 tablet (5 mg total) by mouth 2 (two) times daily. 60 tablet 2   atorvastatin (LIPITOR) 40 MG tablet Take 1 tablet (40 mg total) by mouth daily. 30 tablet 0   Calcium Carb-Cholecalciferol (CALCIUM 600+D3 PO) Take 1 tablet by mouth daily.     Cholecalciferol (VITAMIN D) 50 MCG (2000 UT) tablet Take 2,000 Units by mouth daily.     doxycycline (VIBRA-TABS) 100 MG tablet Take by mouth.     escitalopram (LEXAPRO) 10 MG tablet Take 10 mg by mouth daily.     furosemide (LASIX) 40 MG tablet Take 1 tablet (40 mg total) by mouth daily. 30 tablet 1   gabapentin (NEURONTIN) 300 MG capsule Take 2 capsules (600 mg total) by  mouth 3 (three) times daily. (Patient taking differently: Take by mouth 3 (three) times daily.) 90 capsule 5   lidocaine-prilocaine (EMLA) cream Apply 1 application topically as needed. 30 g 0   magnesium oxide (MAG-OX) 400 MG tablet Take 1 tablet (400 mg total) by mouth daily. 14 tablet 0   Multiple Vitamins-Minerals (CENTRUM SILVER) tablet daily in the afternoon.     MYRBETRIQ 50 MG TB24 tablet Take 50 mg by mouth daily.     ondansetron (ZOFRAN ODT) 4 MG disintegrating tablet Take 1 tablet (4 mg total) by mouth every 8 (eight) hours as needed for nausea or vomiting. 20 tablet 0   oxyCODONE-acetaminophen (PERCOCET) 10-325 MG tablet Take 1 tablet by mouth every 6 (six) hours as needed.     pantoprazole (PROTONIX) 40 MG tablet Take 1 tablet (40 mg total) by mouth 2 (two) times daily. 60 tablet 2   potassium chloride SA (KLOR-CON) 20 MEQ tablet Take 2 tablets (40 mEq total) by mouth daily. 60 tablet 1   predniSONE (DELTASONE) 50 MG tablet Take 1 tablet 13 hours, 7 hours and 1 hour prior to CT Scan 3 tablet 0   primidone (MYSOLINE) 50 MG tablet Take 1 tablet (50 mg total) by mouth in the morning and at bedtime. 180 tablet 2   prochlorperazine (COMPAZINE) 10 MG tablet Take 1 tablet (10 mg total) by mouth every 6  (six) hours as needed for nausea or vomiting. 30 tablet 0   rOPINIRole (REQUIP) 0.5 MG tablet Take 0.5 mg by mouth at bedtime.     Semaglutide,0.25 or 0.5MG/DOS, (OZEMPIC, 0.25 OR 0.5 MG/DOSE,) 2 MG/1.5ML SOPN Ozempic 0.25 mg or 0.5 mg (2 mg/1.5 mL) subcutaneous pen injector  INJECT 0.5 MG SUBCUTANEOUSLY ONCE WEEKLY     sucralfate (CARAFATE) 1 GM/10ML suspension Take 10 mLs (1 g total) by mouth 4 (four) times daily -  with meals and at bedtime. 420 mL 2   tamsulosin (FLOMAX) 0.4 MG CAPS capsule Take 0.4 mg by mouth daily.     TESTOSTERONE IM Inject 1 mL into the muscle every 14 (fourteen) days.     tiZANidine (ZANAFLEX) 4 MG tablet Take 4 mg by mouth 3 (three) times daily as needed.     trolamine salicylate (ASPERCREME) 10 % cream Apply 1 application topically 2 (two) times daily as needed for muscle pain.      No current facility-administered medications for this visit.    REVIEW OF SYSTEMS:   Constitutional: ( - ) fevers, ( - )  chills , ( - ) night sweats Eyes: ( - ) blurriness of vision, ( - ) double vision, ( - ) watery eyes Ears, nose, mouth, throat, and face: ( - ) mucositis, ( - ) sore throat Respiratory: ( - ) cough, ( - ) dyspnea, ( - ) wheezes Cardiovascular: ( - ) palpitation, ( - ) chest discomfort, ( + ) lower extremity swelling Gastrointestinal:  ( - ) nausea, ( - ) heartburn, ( - ) change in bowel habits Skin: ( - ) abnormal skin rashes Lymphatics: ( - ) new lymphadenopathy, ( - ) easy bruising Neurological: ( - ) numbness, ( - ) tingling, ( - ) new weaknesses Behavioral/Psych: ( - ) mood change, ( - ) new changes  All other systems were reviewed with the patient and are negative.  PHYSICAL EXAMINATION: ECOG PERFORMANCE STATUS: 1 - Symptomatic but completely ambulatory  Vitals:   08/29/21 1326  BP: (!) 155/89  Pulse: 86  Resp: 17  Temp: 97.8 F (36.6 C)  SpO2: 100%   Filed Weights   08/29/21 1326  Weight: 172 lb 6 oz (78.2 kg)    GENERAL: Well-appearing  elderly Caucasian male, alert, no distress and comfortable SKIN: skin color, texture, turgor are normal, no rashes or significant lesions EYES: conjunctiva are pink and non-injected, sclera clear LUNGS: clear to auscultation and percussion with normal breathing effort HEART: regular rate & rhythm and no murmurs. Mild lower extremity edema, improved.  PSYCH: alert & oriented x 3, fluent speech NEURO: no focal motor/sensory deficits  LABORATORY DATA:  I have reviewed the data as listed CBC Latest Ref Rng & Units 08/29/2021 08/15/2021 08/02/2021  WBC 4.0 - 10.5 K/uL 6.3 6.9 10.6(H)  Hemoglobin 13.0 - 17.0 g/dL 10.6(L) 11.0(L) 10.6(L)  Hematocrit 39.0 - 52.0 % 32.6(L) 34.2(L) 32.5(L)  Platelets 150 - 400 K/uL 173 226 148(L)    CMP Latest Ref Rng & Units 08/29/2021 08/15/2021 08/02/2021  Glucose 70 - 99 mg/dL 85 123(H) 118(H)  BUN 8 - 23 mg/dL _0 Creatinine 0.61 - 1.24 mg/dL 0.74 0.80 0.81  Sodium 135 - 145 mmol/L 134(L) 135 137  Potassium 3.5 - 5.1 mmol/L 4.1 3.6 3.7  Chloride 98 - 111 mmol/L 101 99 100  CO2 22 - 32 mmol/L _1 Calcium 8.9 - 10.3 mg/dL 8.9 8.9 8.3(L)  Total Protein 6.5 - 8.1 g/dL 6.3(L) 6.5 5.7(L)  Total Bilirubin 0.3 - 1.2 mg/dL 0.5 0.3 0.3  Alkaline Phos 38 - 126 U/L 86 93 100  AST 15 - 41 U/L _2 ALT 0 - 44 U/L _3 RADIOGRAPHIC STUDIES: I have personally reviewed the radiological images as listed and agreed with the findings in the report: FDG avid distal esophagus as well as subcutaneous none lymph node region in the right neck. No results found.  ASSESSMENT & PLAN WHITT AULETTA 75 y.o. male with medical history significant for metastatic esophageal adenocarcinoma who presents for a follow up visit. The patient is negative for HER2 but has a high TPS score.  Therefore he is an excellent candidate for FOLFOX with nivolumab.    The regimen of FOLFOX with nivolumab will be administered q 2 weeks with clinic visits prior to each  infusion. This will be continued until progression or intolerance.   #Metastatic Adenocarcinoma of the Esophagus #Dysphagia -- Underwent palliative radiation in order to help alleviate his dysphagia. Patient now tolerating PO well.  --Unfortunately given the spread of this cancer to the skin we will need to treat this as metastatic disease with systemic therapy  -- Final testing on the tissue resulted with HER2 negative status, but TPS score of 60%.  Therefore he would be a good candidate for FOLFOX with nivolumab --Started Cycle 1 Day 1 of FOLFOX plus Nivolumab on 04/26/2021 with good tolerance.  --Restaging CT Scan form 07/13/2021 shows stable soft tissue thickening of the esophagus. No evidence of progression.  --Labs today reviewed and adequate for treatment. WBC 6.3, Hgb stable at 10.6, Plt 173K. TSH levels are pending. CMP reviewed and requires no intervention. --Recommend to proceed with Cycle 8 Day 1 today.  --RTC in 2 weeks for Cycle 9.   #Supportive Care -- chemotherapy education complete -- port placed -- zofran 12m q8H PRN and compazine 146mPO q6H for nausea -- EMLA cream for port -- HOLD on placing PEG tube as he is currently tolerating PO remarkably well.   No orders  of the defined types were placed in this encounter.   All questions were answered. The patient knows to call the clinic with any problems, questions or concerns.  I have spent a total of 30 minutes minutes of face-to-face and non-face-to-face time, preparing to see the patient, performing a medically appropriate examination, counseling and educating the patient, documenting clinical information in the electronic health record, and care coordination.   Lincoln Brigham, PA-C Department of Hematology/Oncology [place] [facility] Phone: [phone]   08/29/2021 1:58 PM

## 2021-08-30 ENCOUNTER — Other Ambulatory Visit: Payer: PPO

## 2021-08-30 ENCOUNTER — Ambulatory Visit: Payer: PPO

## 2021-08-31 ENCOUNTER — Ambulatory Visit: Payer: PPO

## 2021-08-31 ENCOUNTER — Inpatient Hospital Stay: Payer: PPO

## 2021-08-31 ENCOUNTER — Other Ambulatory Visit: Payer: Self-pay

## 2021-08-31 ENCOUNTER — Other Ambulatory Visit: Payer: PPO

## 2021-08-31 VITALS — BP 128/66 | Temp 98.6°F

## 2021-08-31 DIAGNOSIS — Z5112 Encounter for antineoplastic immunotherapy: Secondary | ICD-10-CM | POA: Diagnosis not present

## 2021-08-31 DIAGNOSIS — C155 Malignant neoplasm of lower third of esophagus: Secondary | ICD-10-CM

## 2021-08-31 MED ORDER — HEPARIN SOD (PORK) LOCK FLUSH 100 UNIT/ML IV SOLN
500.0000 [IU] | Freq: Once | INTRAVENOUS | Status: AC | PRN
  Administered 2021-08-31: 500 [IU]

## 2021-08-31 MED ORDER — SODIUM CHLORIDE 0.9% FLUSH
10.0000 mL | INTRAVENOUS | Status: DC | PRN
  Administered 2021-08-31: 10 mL

## 2021-09-04 DIAGNOSIS — R3915 Urgency of urination: Secondary | ICD-10-CM | POA: Diagnosis not present

## 2021-09-04 DIAGNOSIS — R8279 Other abnormal findings on microbiological examination of urine: Secondary | ICD-10-CM | POA: Diagnosis not present

## 2021-09-04 DIAGNOSIS — N401 Enlarged prostate with lower urinary tract symptoms: Secondary | ICD-10-CM | POA: Diagnosis not present

## 2021-09-05 DIAGNOSIS — I10 Essential (primary) hypertension: Secondary | ICD-10-CM | POA: Diagnosis not present

## 2021-09-05 DIAGNOSIS — D692 Other nonthrombocytopenic purpura: Secondary | ICD-10-CM | POA: Diagnosis not present

## 2021-09-12 ENCOUNTER — Other Ambulatory Visit: Payer: Self-pay

## 2021-09-12 ENCOUNTER — Inpatient Hospital Stay: Payer: PPO

## 2021-09-12 ENCOUNTER — Inpatient Hospital Stay (HOSPITAL_BASED_OUTPATIENT_CLINIC_OR_DEPARTMENT_OTHER): Payer: PPO | Admitting: Hematology and Oncology

## 2021-09-12 ENCOUNTER — Other Ambulatory Visit: Payer: Self-pay | Admitting: Hematology and Oncology

## 2021-09-12 ENCOUNTER — Encounter: Payer: Self-pay | Admitting: Hematology and Oncology

## 2021-09-12 VITALS — BP 151/91 | HR 99 | Temp 97.7°F | Resp 18 | Wt 169.4 lb

## 2021-09-12 DIAGNOSIS — Z95828 Presence of other vascular implants and grafts: Secondary | ICD-10-CM

## 2021-09-12 DIAGNOSIS — C155 Malignant neoplasm of lower third of esophagus: Secondary | ICD-10-CM | POA: Diagnosis not present

## 2021-09-12 DIAGNOSIS — C159 Malignant neoplasm of esophagus, unspecified: Secondary | ICD-10-CM | POA: Diagnosis not present

## 2021-09-12 DIAGNOSIS — R1319 Other dysphagia: Secondary | ICD-10-CM

## 2021-09-12 DIAGNOSIS — Z5112 Encounter for antineoplastic immunotherapy: Secondary | ICD-10-CM | POA: Diagnosis not present

## 2021-09-12 DIAGNOSIS — C153 Malignant neoplasm of upper third of esophagus: Secondary | ICD-10-CM | POA: Diagnosis not present

## 2021-09-12 LAB — CMP (CANCER CENTER ONLY)
ALT: 15 U/L (ref 0–44)
AST: 22 U/L (ref 15–41)
Albumin: 3.8 g/dL (ref 3.5–5.0)
Alkaline Phosphatase: 94 U/L (ref 38–126)
Anion gap: 8 (ref 5–15)
BUN: 12 mg/dL (ref 8–23)
CO2: 24 mmol/L (ref 22–32)
Calcium: 9.3 mg/dL (ref 8.9–10.3)
Chloride: 100 mmol/L (ref 98–111)
Creatinine: 0.77 mg/dL (ref 0.61–1.24)
GFR, Estimated: >60 mL/min (ref >60)
Glucose, Bld: 74 mg/dL (ref 70–99)
Potassium: 4.1 mmol/L (ref 3.5–5.1)
Sodium: 132 mmol/L — ABNORMAL LOW (ref 135–145)
Total Bilirubin: 0.4 mg/dL (ref 0.3–1.2)
Total Protein: 6.5 g/dL (ref 6.5–8.1)

## 2021-09-12 LAB — CBC WITH DIFFERENTIAL (CANCER CENTER ONLY)
Abs Immature Granulocytes: 0.04 10*3/uL (ref 0.00–0.07)
Basophils Absolute: 0.1 10*3/uL (ref 0.0–0.1)
Basophils Relative: 1 %
Eosinophils Absolute: 0.1 10*3/uL (ref 0.0–0.5)
Eosinophils Relative: 1 %
HCT: 33.7 % — ABNORMAL LOW (ref 39.0–52.0)
Hemoglobin: 11.1 g/dL — ABNORMAL LOW (ref 13.0–17.0)
Immature Granulocytes: 1 %
Lymphocytes Relative: 8 %
Lymphs Abs: 0.6 10*3/uL — ABNORMAL LOW (ref 0.7–4.0)
MCH: 33.4 pg (ref 26.0–34.0)
MCHC: 32.9 g/dL (ref 30.0–36.0)
MCV: 101.5 fL — ABNORMAL HIGH (ref 80.0–100.0)
Monocytes Absolute: 0.7 10*3/uL (ref 0.1–1.0)
Monocytes Relative: 9 %
Neutro Abs: 6.1 10*3/uL (ref 1.7–7.7)
Neutrophils Relative %: 80 %
Platelet Count: 125 10*3/uL — ABNORMAL LOW (ref 150–400)
RBC: 3.32 MIL/uL — ABNORMAL LOW (ref 4.22–5.81)
RDW: 16.6 % — ABNORMAL HIGH (ref 11.5–15.5)
WBC Count: 7.5 10*3/uL (ref 4.0–10.5)
nRBC: 0 % (ref 0.0–0.2)

## 2021-09-12 LAB — TSH: TSH: 0.643 u[IU]/mL (ref 0.320–4.118)

## 2021-09-12 MED ORDER — LEUCOVORIN CALCIUM INJECTION 350 MG
400.0000 mg/m2 | Freq: Once | INTRAVENOUS | Status: AC
  Administered 2021-09-12: 14:00:00 808 mg via INTRAVENOUS
  Filled 2021-09-12: qty 25

## 2021-09-12 MED ORDER — FLUOROURACIL CHEMO INJECTION 2.5 GM/50ML
400.0000 mg/m2 | Freq: Once | INTRAVENOUS | Status: AC
  Administered 2021-09-12: 16:00:00 800 mg via INTRAVENOUS
  Filled 2021-09-12: qty 16

## 2021-09-12 MED ORDER — PALONOSETRON HCL INJECTION 0.25 MG/5ML
0.2500 mg | Freq: Once | INTRAVENOUS | Status: AC
  Administered 2021-09-12: 13:00:00 0.25 mg via INTRAVENOUS
  Filled 2021-09-12: qty 5

## 2021-09-12 MED ORDER — SODIUM CHLORIDE 0.9% FLUSH: 10.0000 mL | INTRAVENOUS | Status: DC | PRN

## 2021-09-12 MED ORDER — HEPARIN SOD (PORK) LOCK FLUSH 100 UNIT/ML IV SOLN: 500.0000 [IU] | Freq: Once | INTRAVENOUS | Status: DC | PRN

## 2021-09-12 MED ORDER — SODIUM CHLORIDE 0.9 % IV SOLN
2400.0000 mg/m2 | INTRAVENOUS | Status: DC
  Administered 2021-09-12: 16:00:00 4850 mg via INTRAVENOUS
  Filled 2021-09-12: qty 97

## 2021-09-12 MED ORDER — DEXTROSE 5 % IV SOLN: Freq: Once | INTRAVENOUS | Status: AC

## 2021-09-12 MED ORDER — SODIUM CHLORIDE 0.9% FLUSH
10.0000 mL | Freq: Once | INTRAVENOUS | Status: AC
  Administered 2021-09-12: 11:00:00 10 mL

## 2021-09-12 MED ORDER — SODIUM CHLORIDE 0.9 % IV SOLN
240.0000 mg | Freq: Once | INTRAVENOUS | Status: AC
  Administered 2021-09-12: 13:00:00 240 mg via INTRAVENOUS
  Filled 2021-09-12: qty 24

## 2021-09-12 MED ORDER — OXALIPLATIN CHEMO INJECTION 100 MG/20ML
85.0000 mg/m2 | Freq: Once | INTRAVENOUS | Status: AC
  Administered 2021-09-12: 14:00:00 170 mg via INTRAVENOUS
  Filled 2021-09-12: qty 34

## 2021-09-12 MED ORDER — SODIUM CHLORIDE 0.9 % IV SOLN
10.0000 mg | Freq: Once | INTRAVENOUS | Status: AC
  Administered 2021-09-12: 13:00:00 10 mg via INTRAVENOUS
  Filled 2021-09-12: qty 10

## 2021-09-12 NOTE — Progress Notes (Signed)
[facility] [phone]   [phone]  PROGRESS NOTE  Patient Care Team: Carol Ada, MD as PCP - General (Family Medicine) Evans Lance, MD as PCP - Cardiology (Cardiology) Orson Slick, MD as Consulting Physician (Hematology and Oncology)  Hematological/Oncological History # Metastatic Adenocarcinoma of the Distal Esophagus 01/27/2021: dermatology performed a punch biopsy of the right superior lateral anterior neck. Finding show adenocarcinoma negative for PSA and cytokeratin 20.  02/06/2021: establish care with Dr. Lorenso Courier  02/15/2021: EGD found esophageal mass. Biopsy confirms poorly differentiated carcinoma 02/22/2021: PET CT scan shows FDG avid mass in distal esophagus and subcutaneous tissue or right neck.  03/22/2021-04/05/2021: palliative radiation to the esophageal mass.  04/26/2021: Cycle 1 Day 1 of FOLFOX + Nivolumab 05/10/2021: Cycle 2 Day 1 of FOLFOX + Nivolumab 05/24/2021: Cycle 3 Day 1 of FOLFOX + Nivolumab 06/07/2021: Cycle 4 Day 1 of FOLFOX + Nivolumab 06/23/2021: Cycle 5 Day 1 of FOLFOX + Nivolumab 07/05/2021: Cycle 6 Day 1 of FOLFOX + Nivolumab 08/15/2021: Cycle 7 Day 1 of FOLFOX + Nivolumab (delayed due to hospitalization for N/V and dehydration) 08/29/2021: Cycle 8 Day 1 of FOLFOX + Nivolumab   Interval History:  Justin Trujillo. 75 y.o. male with medical history significant for metastatic esophageal adenocarcinoma who presents for a follow up visit. The patient's last visit was on 08/29/2021. In the interim, Justin Trujillo was completed cycle 8 of treatment.    On exam today Justin Trujillo reports that chemotherapy has been going "just fine".  He notes that the major issue he has is fatigue 1 to 2 days afterwards.  He also does have some cold sensitivity which can be a hassle.  He reports that he did have episodes of urinary urgency/incontinence and has been seen by urology for this.  They took him off his Lasix and increase his Flomax.  He  notes that the biggest issue he has is his chronic back pain.  He reports he is going to be seeing pain management on September 22, 2021 and hopes to have him increase his pain medication.  He reports that at baseline his pain is about 3-4 in severity but can easily jump to 5-6 without much warning.  He reports that if he coughs the pain can go up to about an 8 out of 10.  The Percocets just barely take off the edge.  He is otherwise willing and able to proceed with treatment at this time.  He denies fevers, chills, night sweats, chest pain, cough, mucositis or rash. He has no other complaints. Rest of the 10 point ROS is below.  MEDICAL HISTORY:  Past Medical History:  Diagnosis Date   Arthritis    Cancer ([place])    Coronary atherosclerosis of native coronary artery    Depression    Esophageal cancer (HCC)    Essential tremor    High cholesterol    Hypertension    Hypertension    Hypogonadism male    Obstructive chronic bronchitis with exacerbation (HCC)    Obstructive sleep apnea (adult) (pediatric)    no cpap   Other and unspecified hyperlipidemia    Other emphysema ([place])    Proteinuria 2019   has seen a nephrologist   Shortness of breath    with excertion   Situational stress    Type II or unspecified type diabetes mellitus without mention of complication, not stated as uncontrolled    type 2   Unspecified essential hypertension  SURGICAL HISTORY: Past Surgical History:  Procedure Laterality Date   CARDIAC CATHETERIZATION     IR IMAGING GUIDED PORT INSERTION  04/24/2021   IR KYPHO LUMBAR INC FX REDUCE BONE BX UNI/BIL CANNULATION INC/IMAGING  06/13/2018   L lens implant     R knee replacement x2     REVERSE SHOULDER ARTHROPLASTY Left 03/26/2019   Procedure: REVERSE SHOULDER ARTHROPLASTY;  Surgeon: Justice Britain, MD;  Location: [facility];  Service: Orthopedics;  Laterality: Left;  166mn   TONSILLECTOMY     TONSILLECTOMY AND ADENOIDECTOMY     vascectomy      SOCIAL  HISTORY: Social History   Socioeconomic History   Marital status: Married    Spouse name: Not on file   Number of children: Not on file   Years of education: Not on file   Highest education level: Not on file  Occupational History   Occupation: lLandscape architect Tobacco Use   Smoking status: Every Day    Packs/day: 1.00    Years: 55.00    Pack years: 55.00    Types: Cigarettes   Smokeless tobacco: Never  Vaping Use   Vaping Use: Never used  Substance and Sexual Activity   Alcohol use: Yes    Alcohol/week: 1.0 standard drink    Types: 1 Cans of beer per week    Comment: occasional beer   Drug use: Never   Sexual activity: Not Currently  Other Topics Concern   Not on file  Social History Narrative   ** Merged History Encounter **       Social Determinants of Health   Financial Resource Strain: Not on file  Food Insecurity: Not on file  Transportation Needs: Not on file  Physical Activity: Not on file  Stress: Not on file  Social Connections: Not on file  Intimate Partner Violence: Not on file    FAMILY HISTORY: Family History  Problem Relation Age of Onset   Emphysema Father    Heart disease Father    Clotting disorder Mother    Kidney cancer Child     ALLERGIES:  is allergic to iodinated contrast media, moxifloxacin, penicillins, shellfish-derived products, amoxicillin, chlorhexidine, other, and latex.  MEDICATIONS:  Current Outpatient Medications  Medication Sig Dispense Refill   acetaminophen (TYLENOL) 500 MG tablet Take 650 mg by mouth every 8 (eight) hours as needed for mild pain (pain).     albuterol (VENTOLIN HFA) 108 (90 Base) MCG/ACT inhaler Inhale 1 puff into the lungs every 6 (six) hours as needed for wheezing.     ALPRAZolam (XANAX) 0.5 MG tablet Take 0.5 mg by mouth 2 (two) times daily as needed for anxiety.     amiodarone (PACERONE) 200 MG tablet Take 2 tablets (400 mg total) by mouth 2 (two) times daily for 7 days, THEN 1 tablet  (200 mg total) daily. 90 tablet 3   apixaban (ELIQUIS) 5 MG TABS tablet Take 1 tablet (5 mg total) by mouth 2 (two) times daily. 60 tablet 2   atorvastatin (LIPITOR) 40 MG tablet Take 1 tablet (40 mg total) by mouth daily. 30 tablet 0   Calcium Carb-Cholecalciferol (CALCIUM 600+D3 PO) Take 1 tablet by mouth daily.     Cholecalciferol (VITAMIN D) 50 MCG (2000 UT) tablet Take 2,000 Units by mouth daily.     doxycycline (VIBRA-TABS) 100 MG tablet Take by mouth.     escitalopram (LEXAPRO) 10 MG tablet Take 10 mg by mouth daily.     gabapentin (NEURONTIN) 300  MG capsule Take 2 capsules (600 mg total) by mouth 3 (three) times daily. (Patient taking differently: Take by mouth 3 (three) times daily.) 90 capsule 5   lidocaine-prilocaine (EMLA) cream Apply 1 application topically as needed. 30 g 0   magnesium oxide (MAG-OX) 400 MG tablet Take 1 tablet (400 mg total) by mouth daily. 14 tablet 0   Multiple Vitamins-Minerals (CENTRUM SILVER) tablet daily in the afternoon.     MYRBETRIQ 50 MG TB24 tablet Take 50 mg by mouth daily.     ondansetron (ZOFRAN ODT) 4 MG disintegrating tablet Take 1 tablet (4 mg total) by mouth every 8 (eight) hours as needed for nausea or vomiting. 20 tablet 0   oxyCODONE-acetaminophen (PERCOCET) 10-325 MG tablet Take 1 tablet by mouth every 4 (four) hours as needed.     pantoprazole (PROTONIX) 40 MG tablet Take 1 tablet (40 mg total) by mouth 2 (two) times daily. 60 tablet 2   potassium chloride SA (KLOR-CON) 20 MEQ tablet Take 2 tablets (40 mEq total) by mouth daily. 60 tablet 1   predniSONE (DELTASONE) 50 MG tablet Take 1 tablet 13 hours, 7 hours and 1 hour prior to CT Scan 3 tablet 0   primidone (MYSOLINE) 50 MG tablet Take 1 tablet (50 mg total) by mouth in the morning and at bedtime. 180 tablet 2   prochlorperazine (COMPAZINE) 10 MG tablet Take 1 tablet (10 mg total) by mouth every 6 (six) hours as needed for nausea or vomiting. 30 tablet 0   rOPINIRole (REQUIP) 0.5 MG tablet  Take 0.5 mg by mouth at bedtime.     Semaglutide,0.25 or 0.5MG/DOS, (OZEMPIC, 0.25 OR 0.5 MG/DOSE,) 2 MG/1.5ML SOPN Ozempic 0.25 mg or 0.5 mg (2 mg/1.5 mL) subcutaneous pen injector  INJECT 0.5 MG SUBCUTANEOUSLY ONCE WEEKLY     tamsulosin (FLOMAX) 0.4 MG CAPS capsule Take 0.4 mg by mouth 2 (two) times daily.     TESTOSTERONE IM Inject 1 mL into the muscle every 14 (fourteen) days.     tiZANidine (ZANAFLEX) 4 MG tablet Take 4 mg by mouth 3 (three) times daily as needed.     trolamine salicylate (ASPERCREME) 10 % cream Apply 1 application topically 2 (two) times daily as needed for muscle pain.      furosemide (LASIX) 40 MG tablet Take 1 tablet (40 mg total) by mouth daily. (Patient not taking: Reported on 09/12/2021) 30 tablet 1   sucralfate (CARAFATE) 1 GM/10ML suspension Take 10 mLs (1 g total) by mouth 4 (four) times daily -  with meals and at bedtime. (Patient not taking: Reported on 09/12/2021) 420 mL 2   No current facility-administered medications for this visit.   Facility-Administered Medications Ordered in Other Visits  Medication Dose Route Frequency Provider Last Rate Last Admin   dexamethasone (DECADRON) 10 mg in sodium chloride 0.9 % 50 mL IVPB  10 mg Intravenous Once Orson Slick, MD       fluorouracil (ADRUCIL) 4,850 mg in sodium chloride 0.9 % 53 mL chemo infusion  2,400 mg/m2 (Treatment Plan Recorded) Intravenous 1 day or 1 dose Ledell Peoples IV, MD       fluorouracil (ADRUCIL) chemo injection 800 mg  400 mg/m2 (Treatment Plan Recorded) Intravenous Once Ledell Peoples IV, MD       heparin lock flush 100 unit/mL  500 Units Intracatheter Once PRN Ledell Peoples IV, MD       leucovorin 808 mg in dextrose 5 % 250 mL infusion  400  mg/m2 (Treatment Plan Recorded) Intravenous Once Orson Slick, MD       nivolumab (OPDIVO) 240 mg in sodium chloride 0.9 % 100 mL chemo infusion  240 mg Intravenous Once Orson Slick, MD       oxaliplatin (ELOXATIN) 170 mg in dextrose 5 % 500  mL chemo infusion  85 mg/m2 (Treatment Plan Recorded) Intravenous Once Orson Slick, MD       sodium chloride flush (NS) 0.9 % injection 10 mL  10 mL Intracatheter PRN Orson Slick, MD        REVIEW OF SYSTEMS:   Constitutional: ( - ) fevers, ( - )  chills , ( - ) night sweats Eyes: ( - ) blurriness of vision, ( - ) double vision, ( - ) watery eyes Ears, nose, mouth, throat, and face: ( - ) mucositis, ( - ) sore throat Respiratory: ( - ) cough, ( - ) dyspnea, ( - ) wheezes Cardiovascular: ( - ) palpitation, ( - ) chest discomfort, ( + ) lower extremity swelling Gastrointestinal:  ( - ) nausea, ( - ) heartburn, ( - ) change in bowel habits Skin: ( - ) abnormal skin rashes Lymphatics: ( - ) new lymphadenopathy, ( - ) easy bruising Neurological: ( - ) numbness, ( - ) tingling, ( - ) new weaknesses Behavioral/Psych: ( - ) mood change, ( - ) new changes  All other systems were reviewed with the patient and are negative.  PHYSICAL EXAMINATION: ECOG PERFORMANCE STATUS: 1 - Symptomatic but completely ambulatory  Vitals:   09/12/21 1130  BP: (!) 151/91  Pulse: 99  Resp: 18  Temp: 97.7 F (36.5 C)  SpO2: 98%   Filed Weights   09/12/21 1130  Weight: 169 lb 6.4 oz (76.8 kg)    GENERAL: Well-appearing elderly Caucasian male, alert, no distress and comfortable SKIN: skin color, texture, turgor are normal, no rashes or significant lesions EYES: conjunctiva are pink and non-injected, sclera clear LUNGS: clear to auscultation and percussion with normal breathing effort HEART: regular rate & rhythm and no murmurs. Mild lower extremity edema, improved.  PSYCH: alert & oriented x 3, fluent speech NEURO: no focal motor/sensory deficits  LABORATORY DATA:  I have reviewed the data as listed CBC Latest Ref Rng & Units 09/12/2021 08/29/2021 08/15/2021  WBC 4.0 - 10.5 K/uL 7.5 6.3 6.9  Hemoglobin 13.0 - 17.0 g/dL 11.1(L) 10.6(L) 11.0(L)  Hematocrit 39.0 - 52.0 % 33.7(L) 32.6(L) 34.2(L)   Platelets 150 - 400 K/uL 125(L) 173 226    CMP Latest Ref Rng & Units 09/12/2021 08/29/2021 08/15/2021  Glucose 70 - 99 mg/dL 74 85 123(H)  BUN 8 - 23 mg/dL _0 Creatinine 0.61 - 1.24 mg/dL 0.77 0.74 0.80  Sodium 135 - 145 mmol/L 132(L) 134(L) 135  Potassium 3.5 - 5.1 mmol/L 4.1 4.1 3.6  Chloride 98 - 111 mmol/L 100 101 99  CO2 22 - 32 mmol/L _1 Calcium 8.9 - 10.3 mg/dL 9.3 8.9 8.9  Total Protein 6.5 - 8.1 g/dL 6.5 6.3(L) 6.5  Total Bilirubin 0.3 - 1.2 mg/dL 0.4 0.5 0.3  Alkaline Phos 38 - 126 U/L 94 86 93  AST 15 - 41 U/L _2 ALT 0 - 44 U/L _3 RADIOGRAPHIC STUDIES: I have personally reviewed the radiological images as listed and agreed with the findings in the report: FDG avid distal esophagus as well as subcutaneous  none lymph node region in the right neck. No results found.  ASSESSMENT & PLAN Justin Trujillo 75 y.o. male with medical history significant for metastatic esophageal adenocarcinoma who presents for a follow up visit. The patient is negative for HER2 but has a high TPS score.  Therefore he is an excellent candidate for FOLFOX with nivolumab.    The regimen of FOLFOX with nivolumab will be administered q 2 weeks with clinic visits prior to each infusion. This will be continued until progression or intolerance.   #Metastatic Adenocarcinoma of the Esophagus #Dysphagia -- Underwent palliative radiation in order to help alleviate his dysphagia. Patient now tolerating PO well.  --Unfortunately given the spread of this cancer to the skin we will need to treat this as metastatic disease with systemic therapy  -- Final testing on the tissue resulted with HER2 negative status, but TPS score of 60%.  Therefore he would be a good candidate for FOLFOX with nivolumab --Started Cycle 1 Day 1 of FOLFOX plus Nivolumab on 04/26/2021 with good tolerance.  --Restaging CT Scan form 07/13/2021 shows stable soft tissue thickening of the esophagus. No evidence of  progression.  --Labs today reviewed and adequate for treatment. WBC 7.5, Hgb stable at 11.1, Plt 125K. TSH levels are pending. CMP reviewed and requires no intervention. --Recommend to proceed with Cycle 9 Day 1 today.  --RTC in 2 weeks for Cycle 10.   #Supportive Care -- chemotherapy education complete -- port placed -- zofran 27m q8H PRN and compazine 160mPO q6H for nausea -- EMLA cream for port  No orders of the defined types were placed in this encounter.  All questions were answered. The patient knows to call the clinic with any problems, questions or concerns.  I have spent a total of 30 minutes minutes of face-to-face and non-face-to-face time, preparing to see the patient, performing a medically appropriate examination, counseling and educating the patient, documenting clinical information in the electronic health record, and care coordination.   JoLedell PeoplesMD Department of Hematology/Oncology [facility] Camp[facility]one: [phone]: [phone]: [email or web address]   09/12/2021 12:43 PM

## 2021-09-12 NOTE — Patient Instructions (Signed)
[facility]  Discharge Instructions: Thank you for choosing [facility] to provide your oncology and hematology care.   If you have a lab appointment with the [facility], please go directly to the [facility] and check in at the registration area.   Wear comfortable clothing and clothing appropriate for easy access to any Portacath or PICC line.   We strive to give you quality time with your provider. You may need to reschedule your appointment if you arrive late (15 or more minutes).  Arriving late affects you and other patients whose appointments are after yours.  Also, if you miss three or more appointments without notifying the office, you may be dismissed from the clinic at the providers discretion.      For prescription refill requests, have your pharmacy contact our office and allow 72 hours for refills to be completed.    Today you received the following chemotherapy and/or immunotherapy agents: nivolumab; oxaliplatin; 5-FU    To help prevent nausea and vomiting after your treatment, we encourage you to take your nausea medication as directed.  BELOW ARE SYMPTOMS THAT SHOULD BE REPORTED IMMEDIATELY: *FEVER GREATER THAN 100.4 F (38 C) OR HIGHER *CHILLS OR SWEATING *NAUSEA AND VOMITING THAT IS NOT CONTROLLED WITH YOUR NAUSEA MEDICATION *UNUSUAL SHORTNESS OF BREATH *UNUSUAL BRUISING OR BLEEDING *URINARY PROBLEMS (pain or burning when urinating, or frequent urination) *BOWEL PROBLEMS (unusual diarrhea, constipation, pain near the anus) TENDERNESS IN MOUTH AND THROAT WITH OR WITHOUT PRESENCE OF ULCERS (sore throat, sores in mouth, or a toothache) UNUSUAL RASH, SWELLING OR PAIN  UNUSUAL VAGINAL DISCHARGE OR ITCHING   Items with * indicate a potential emergency and should be followed up as soon as possible or go to the Emergency Department if any problems should occur.  Please show the CHEMOTHERAPY ALERT CARD or IMMUNOTHERAPY ALERT  CARD at check-in to the Emergency Department and triage nurse.  Should you have questions after your visit or need to cancel or reschedule your appointment, please contact [facility]  Dept: [phone]  and follow the prompts.  Office hours are 8:00 a.m. to 4:30 p.m. Monday - Friday. Please note that voicemails left after 4:00 p.m. may not be returned until the following business day.  We are closed weekends and major holidays. You have access to a nurse at all times for urgent questions. Please call the main number to the clinic Dept: [phone] and follow the prompts.   For any non-urgent questions, you may also contact your provider using MyChart. We now offer e-Visits for anyone 23 and older to request care online for non-urgent symptoms. For details visit [email or web address].   Also download the MyChart app! Go to the app store, search "MyChart", open the app, select [facility], and log in with your MyChart username and password.  Due to Covid, a mask is required upon entering the hospital/clinic. If you do not have a mask, one will be given to you upon arrival. For doctor visits, patients may have 1 support person aged 62 or older with them. For treatment visits, patients cannot have anyone with them due to current Covid guidelines and our immunocompromised population.

## 2021-09-14 ENCOUNTER — Inpatient Hospital Stay: Payer: PPO

## 2021-09-14 ENCOUNTER — Other Ambulatory Visit: Payer: Self-pay

## 2021-09-14 VITALS — BP 126/76 | HR 93 | Temp 98.2°F | Resp 18

## 2021-09-14 DIAGNOSIS — C155 Malignant neoplasm of lower third of esophagus: Secondary | ICD-10-CM

## 2021-09-14 DIAGNOSIS — Z5112 Encounter for antineoplastic immunotherapy: Secondary | ICD-10-CM | POA: Diagnosis not present

## 2021-09-14 MED ORDER — SODIUM CHLORIDE 0.9% FLUSH
10.0000 mL | INTRAVENOUS | Status: DC | PRN
  Administered 2021-09-14: 15:00:00 10 mL

## 2021-09-14 MED ORDER — HEPARIN SOD (PORK) LOCK FLUSH 100 UNIT/ML IV SOLN
500.0000 [IU] | Freq: Once | INTRAVENOUS | Status: AC | PRN
  Administered 2021-09-14: 15:00:00 500 [IU]

## 2021-09-19 ENCOUNTER — Telehealth: Payer: Self-pay | Admitting: Internal Medicine

## 2021-09-19 DIAGNOSIS — E785 Hyperlipidemia, unspecified: Secondary | ICD-10-CM | POA: Diagnosis not present

## 2021-09-19 DIAGNOSIS — U071 COVID-19: Secondary | ICD-10-CM | POA: Diagnosis not present

## 2021-09-19 DIAGNOSIS — I10 Essential (primary) hypertension: Secondary | ICD-10-CM | POA: Diagnosis not present

## 2021-09-19 DIAGNOSIS — C159 Malignant neoplasm of esophagus, unspecified: Secondary | ICD-10-CM | POA: Diagnosis not present

## 2021-09-19 NOTE — Telephone Encounter (Signed)
Pt c/o medication issue:  1. Name of Medication: apixaban (ELIQUIS) 5 MG TABS tablet; potassium chloride SA (KLOR-CON) 20 MEQ tablet; pantoprazole (PROTONIX) 40 MG tablet  2. How are you currently taking this medication (dosage and times per day)? As written  3. Are you having a reaction (difficulty breathing--STAT)? No   4. What is your medication issue? Patient has been experiencing nose bleeds when he blows his nose. Wants to know if he should still be taking the medications listed above.

## 2021-09-19 NOTE — Telephone Encounter (Signed)
Left detailed message per DPR.  Good morning!  It is very common this time of year to have a few nosebleeds, especially since you are taking Eliquis.  Get some plain saline nasal spray.  That will help keep your nares moist and will decrease bleeding.  You can also use Afrin from time to time as needed.  Don't use Afrin every day.  It is very important for you to continue your blood thinner to reduce your risk of stroke.  If you have any more questions let me know, Bing Neighbors

## 2021-09-21 NOTE — Telephone Encounter (Signed)
Pt returned call to advise he had received message and no further questions.

## 2021-09-21 NOTE — Telephone Encounter (Signed)
Attempted to contact Pt x 2 to make sure he had heard his voicemail.  Went to [facility] again.  Advised to call office if he had any questions.  Await further needs.

## 2021-09-25 ENCOUNTER — Telehealth: Payer: Self-pay | Admitting: *Deleted

## 2021-09-25 NOTE — Telephone Encounter (Signed)
Called pt as he had contacted our scheduling department to let them know he is Covid +  Spoke with pt. He was diagnosed on 09/17/21. He has been prescribed Paxlovid.  Pt's next appts are for 10/11/21. Advised that since this is >than 21 days from time of diagnosis, he will be fine to come in that day.  Pt voiced understanding.

## 2021-09-26 ENCOUNTER — Ambulatory Visit: Payer: PPO | Admitting: Physician Assistant

## 2021-09-26 DIAGNOSIS — C153 Malignant neoplasm of upper third of esophagus: Secondary | ICD-10-CM | POA: Diagnosis not present

## 2021-09-27 ENCOUNTER — Other Ambulatory Visit: Payer: PPO

## 2021-09-27 ENCOUNTER — Ambulatory Visit: Payer: PPO

## 2021-09-27 ENCOUNTER — Ambulatory Visit: Payer: PPO | Admitting: Physician Assistant

## 2021-09-29 ENCOUNTER — Telehealth: Payer: Self-pay | Admitting: Hematology and Oncology

## 2021-09-29 DIAGNOSIS — F324 Major depressive disorder, single episode, in partial remission: Secondary | ICD-10-CM | POA: Diagnosis not present

## 2021-09-29 DIAGNOSIS — Z792 Long term (current) use of antibiotics: Secondary | ICD-10-CM | POA: Diagnosis not present

## 2021-09-29 DIAGNOSIS — G2581 Restless legs syndrome: Secondary | ICD-10-CM | POA: Diagnosis not present

## 2021-09-29 DIAGNOSIS — Z7901 Long term (current) use of anticoagulants: Secondary | ICD-10-CM | POA: Diagnosis not present

## 2021-09-29 DIAGNOSIS — I129 Hypertensive chronic kidney disease with stage 1 through stage 4 chronic kidney disease, or unspecified chronic kidney disease: Secondary | ICD-10-CM | POA: Diagnosis not present

## 2021-09-29 DIAGNOSIS — M5416 Radiculopathy, lumbar region: Secondary | ICD-10-CM | POA: Diagnosis not present

## 2021-09-29 DIAGNOSIS — J449 Chronic obstructive pulmonary disease, unspecified: Secondary | ICD-10-CM | POA: Diagnosis not present

## 2021-09-29 DIAGNOSIS — F439 Reaction to severe stress, unspecified: Secondary | ICD-10-CM | POA: Diagnosis not present

## 2021-09-29 DIAGNOSIS — Z96651 Presence of right artificial knee joint: Secondary | ICD-10-CM | POA: Diagnosis not present

## 2021-09-29 DIAGNOSIS — E440 Moderate protein-calorie malnutrition: Secondary | ICD-10-CM | POA: Diagnosis not present

## 2021-09-29 DIAGNOSIS — E7800 Pure hypercholesterolemia, unspecified: Secondary | ICD-10-CM | POA: Diagnosis not present

## 2021-09-29 DIAGNOSIS — M5440 Lumbago with sciatica, unspecified side: Secondary | ICD-10-CM | POA: Diagnosis not present

## 2021-09-29 DIAGNOSIS — Z79891 Long term (current) use of opiate analgesic: Secondary | ICD-10-CM | POA: Diagnosis not present

## 2021-09-29 DIAGNOSIS — E291 Testicular hypofunction: Secondary | ICD-10-CM | POA: Diagnosis not present

## 2021-09-29 DIAGNOSIS — M62838 Other muscle spasm: Secondary | ICD-10-CM | POA: Diagnosis not present

## 2021-09-29 DIAGNOSIS — N181 Chronic kidney disease, stage 1: Secondary | ICD-10-CM | POA: Diagnosis not present

## 2021-09-29 DIAGNOSIS — Z6837 Body mass index (BMI) 37.0-37.9, adult: Secondary | ICD-10-CM | POA: Diagnosis not present

## 2021-09-29 DIAGNOSIS — M4150 Other secondary scoliosis, site unspecified: Secondary | ICD-10-CM | POA: Diagnosis not present

## 2021-09-29 DIAGNOSIS — I2510 Atherosclerotic heart disease of native coronary artery without angina pectoris: Secondary | ICD-10-CM | POA: Diagnosis not present

## 2021-09-29 DIAGNOSIS — F17210 Nicotine dependence, cigarettes, uncomplicated: Secondary | ICD-10-CM | POA: Diagnosis not present

## 2021-09-29 DIAGNOSIS — M5136 Other intervertebral disc degeneration, lumbar region: Secondary | ICD-10-CM | POA: Diagnosis not present

## 2021-09-29 DIAGNOSIS — E1122 Type 2 diabetes mellitus with diabetic chronic kidney disease: Secondary | ICD-10-CM | POA: Diagnosis not present

## 2021-09-29 DIAGNOSIS — G894 Chronic pain syndrome: Secondary | ICD-10-CM | POA: Diagnosis not present

## 2021-09-29 DIAGNOSIS — G4700 Insomnia, unspecified: Secondary | ICD-10-CM | POA: Diagnosis not present

## 2021-09-29 DIAGNOSIS — G250 Essential tremor: Secondary | ICD-10-CM | POA: Diagnosis not present

## 2021-09-29 DIAGNOSIS — R1310 Dysphagia, unspecified: Secondary | ICD-10-CM | POA: Diagnosis not present

## 2021-09-29 DIAGNOSIS — N200 Calculus of kidney: Secondary | ICD-10-CM | POA: Diagnosis not present

## 2021-09-29 NOTE — Telephone Encounter (Signed)
Scheduled per 1/12 rn msg, message has been left with pt

## 2021-10-02 NOTE — Progress Notes (Deleted)
Cardiology Office Note Date:  10/02/2021  Patient ID:  Justin Trujillo., DOB 11-10-1945, MRN [id number] PCP:  Carol Ada, MD  Electrophysiologist: Dr. Lovena Le  ***refresh   Chief Complaint: *** 8 week visit  History of Present Illness: Justin Trujillo. is a 76 y.o. male with history of DM, HTN, diastolic CHF, esophageal CA, chronic pain (back)  He comes in today to be seen for Dr. Lovena Le, last seen by him 08/01/21, discussed sedentary and has lost weight. He is still smoking. The patient's nausea has improved. He has undergone palliative radiation.  No CP, noted some edema though. Maintaining SR, planned to continue amiodarone 400mg  BID for 5 days then 200mg  BID until an 8 week APP visit Discussed he was an acceptable surgical candidate with possible upcoming back surgery. Longterm prognosis is guarded w/his esophageal cancer.  *** SR? symptoms *** eliquis, bleeding, dose *** volume *** amio labs   AFib hx Diagnosed Nov 2022 Amiodarone started Nov 2022  Past Medical History:  Diagnosis Date   Arthritis    Cancer [facility])    Coronary atherosclerosis of native coronary artery    Depression    Esophageal cancer (HCC)    Essential tremor    High cholesterol    Hypertension    Hypertension    Hypogonadism male    Obstructive chronic bronchitis with exacerbation (HCC)    Obstructive sleep apnea (adult) (pediatric)    no cpap   Other and unspecified hyperlipidemia    Other emphysema ([place])    Proteinuria 2019   has seen a nephrologist   Shortness of breath    with excertion   Situational stress    Type II or unspecified type diabetes mellitus without mention of complication, not stated as uncontrolled    type 2   Unspecified essential hypertension     Past Surgical History:  Procedure Laterality Date   CARDIAC CATHETERIZATION     IR IMAGING GUIDED PORT INSERTION  04/24/2021   IR KYPHO LUMBAR INC FX REDUCE BONE BX UNI/BIL CANNULATION INC/IMAGING  06/13/2018   L  lens implant     R knee replacement x2     REVERSE SHOULDER ARTHROPLASTY Left 03/26/2019   Procedure: REVERSE SHOULDER ARTHROPLASTY;  Surgeon: Justice Britain, MD;  Location: [facility];  Service: Orthopedics;  Laterality: Left;  115min   TONSILLECTOMY     TONSILLECTOMY AND ADENOIDECTOMY     vascectomy      Current Outpatient Medications  Medication Sig Dispense Refill   acetaminophen (TYLENOL) 500 MG tablet Take 650 mg by mouth every 8 (eight) hours as needed for mild pain (pain).     albuterol (VENTOLIN HFA) 108 (90 Base) MCG/ACT inhaler Inhale 1 puff into the lungs every 6 (six) hours as needed for wheezing.     ALPRAZolam (XANAX) 0.5 MG tablet Take 0.5 mg by mouth 2 (two) times daily as needed for anxiety.     amiodarone (PACERONE) 200 MG tablet Take 2 tablets (400 mg total) by mouth 2 (two) times daily for 7 days, THEN 1 tablet (200 mg total) daily. 90 tablet 3   apixaban (ELIQUIS) 5 MG TABS tablet Take 1 tablet (5 mg total) by mouth 2 (two) times daily. 60 tablet 2   atorvastatin (LIPITOR) 40 MG tablet Take 1 tablet (40 mg total) by mouth daily. 30 tablet 0   Calcium Carb-Cholecalciferol (CALCIUM 600+D3 PO) Take 1 tablet by mouth daily.     Cholecalciferol (VITAMIN D) 50 MCG (2000 UT) tablet  Take 2,000 Units by mouth daily.     doxycycline (VIBRA-TABS) 100 MG tablet Take by mouth.     escitalopram (LEXAPRO) 10 MG tablet Take 10 mg by mouth daily.     furosemide (LASIX) 40 MG tablet Take 1 tablet (40 mg total) by mouth daily. (Patient not taking: Reported on 09/12/2021) 30 tablet 1   gabapentin (NEURONTIN) 300 MG capsule Take 2 capsules (600 mg total) by mouth 3 (three) times daily. (Patient taking differently: Take by mouth 3 (three) times daily.) 90 capsule 5   lidocaine-prilocaine (EMLA) cream Apply 1 application topically as needed. 30 g 0   magnesium oxide (MAG-OX) 400 MG tablet Take 1 tablet (400 mg total) by mouth daily. 14 tablet 0   Multiple Vitamins-Minerals (CENTRUM SILVER) tablet  daily in the afternoon.     MYRBETRIQ 50 MG TB24 tablet Take 50 mg by mouth daily.     ondansetron (ZOFRAN ODT) 4 MG disintegrating tablet Take 1 tablet (4 mg total) by mouth every 8 (eight) hours as needed for nausea or vomiting. 20 tablet 0   oxyCODONE-acetaminophen (PERCOCET) 10-325 MG tablet Take 1 tablet by mouth every 4 (four) hours as needed.     pantoprazole (PROTONIX) 40 MG tablet Take 1 tablet (40 mg total) by mouth 2 (two) times daily. 60 tablet 2   potassium chloride SA (KLOR-CON) 20 MEQ tablet Take 2 tablets (40 mEq total) by mouth daily. 60 tablet 1   predniSONE (DELTASONE) 50 MG tablet Take 1 tablet 13 hours, 7 hours and 1 hour prior to CT Scan 3 tablet 0   primidone (MYSOLINE) 50 MG tablet Take 1 tablet (50 mg total) by mouth in the morning and at bedtime. 180 tablet 2   prochlorperazine (COMPAZINE) 10 MG tablet Take 1 tablet (10 mg total) by mouth every 6 (six) hours as needed for nausea or vomiting. 30 tablet 0   rOPINIRole (REQUIP) 0.5 MG tablet Take 0.5 mg by mouth at bedtime.     Semaglutide,0.25 or 0.5MG /DOS, (OZEMPIC, 0.25 OR 0.5 MG/DOSE,) 2 MG/1.5ML SOPN Ozempic 0.25 mg or 0.5 mg (2 mg/1.5 mL) subcutaneous pen injector  INJECT 0.5 MG SUBCUTANEOUSLY ONCE WEEKLY     sucralfate (CARAFATE) 1 GM/10ML suspension Take 10 mLs (1 g total) by mouth 4 (four) times daily -  with meals and at bedtime. (Patient not taking: Reported on 09/12/2021) 420 mL 2   tamsulosin (FLOMAX) 0.4 MG CAPS capsule Take 0.4 mg by mouth 2 (two) times daily.     TESTOSTERONE IM Inject 1 mL into the muscle every 14 (fourteen) days.     tiZANidine (ZANAFLEX) 4 MG tablet Take 4 mg by mouth 3 (three) times daily as needed.     trolamine salicylate (ASPERCREME) 10 % cream Apply 1 application topically 2 (two) times daily as needed for muscle pain.      No current facility-administered medications for this visit.    Allergies:   Iodinated contrast media, Moxifloxacin, Penicillins, Shellfish-derived products,  Amoxicillin, Chlorhexidine, Other, and Latex   Social History:  The patient  reports that he has been smoking cigarettes. He has a 55.00 pack-year smoking history. He has never used smokeless tobacco. He reports current alcohol use of about 1.0 standard drink per week. He reports that he does not use drugs.   Family History:  The patient's family history includes Clotting disorder in his mother; Emphysema in his father; Heart disease in his father; Kidney cancer in his child.  ROS:  Please see the history  of present illness.    All other systems are reviewed and otherwise negative.   PHYSICAL EXAM:  VS:  There were no vitals taken for this visit. BMI: There is no height or weight on file to calculate BMI. Well nourished, well developed, in no acute distress HEENT: normocephalic, atraumatic Neck: no JVD, carotid bruits or masses Cardiac:  *** RRR; no significant murmurs, no rubs, or gallops Lungs:  *** CTA b/l, no wheezing, rhonchi or rales Abd: soft, nontender MS: no deformity or *** atrophy Ext: *** no edema Skin: warm and dry, no rash Neuro:  No gross deficits appreciated Psych: euthymic mood, full affect    EKG:  Done today and reviewed by myself shows  ***  07/19/21: TTE IMPRESSIONS   1. LV function is hyperdynamic EF 65-70%; Normal basal and mid segments.  However the apex is akinetic indicating a possible stress cardiomyopathy  vs. distal LAD disease. No LV thrombus. Recommend EKG and serial troponin  levels.   FINDINGS   Left Ventricle: LV function is hyperdynamic EF 65-70%; Normal basal and  mid segments. However the apex is akinetic indicating a possible stress  cardiomyopathy vs. distal LAD disease. No LV thrombus. Recommend EKG and  serial troponin levels.    01/25/21: stress myoview The left ventricular ejection fraction is mildly decreased (45-54%). Nuclear stress EF: 51%. There was no ST segment deviation noted during stress. The study is normal. This is a  low risk study.   Low risk stress nuclear study with normal perfusion and low normal left ventricular global systolic function. The previously described LV apical defect is no longer seen. LVEF has improved slightly.   By review of record, cardiac catheterization in 2007 which showed nonobstructive CAD  Recent Labs: 07/21/2021: Magnesium 1.5 09/12/2021: ALT 15; BUN 12; Creatinine 0.77; Hemoglobin 11.1; Platelet Count 125; Potassium 4.1; Sodium 132; TSH 0.643  No results found for requested labs within last 8760 hours.   CrCl cannot be calculated (Unknown ideal weight.).   Wt Readings from Last 3 Encounters:  09/12/21 169 lb 6.4 oz (76.8 kg)  08/29/21 172 lb 6 oz (78.2 kg)  08/15/21 174 lb 14.4 oz (79.3 kg)     Other studies reviewed: Additional studies/records reviewed today include: summarized above  ASSESSMENT AND PLAN:  Paroxysmal Afib CHA2DS2Vasc is 5, on Eliquis, appropriately dosed *** amiodarone *** % burden by symptoms  HTN ***  Chronic CHF (diastolic) ***    Disposition: F/u with ***  Current medicines are reviewed at length with the patient today.  The patient did not have any concerns regarding medicines.  Venetia Night, PA-C 10/02/2021 10:49 AM     [facility] [place] [address] [phone] (office)  [phone] (fax)

## 2021-10-03 DIAGNOSIS — Z8709 Personal history of other diseases of the respiratory system: Secondary | ICD-10-CM | POA: Diagnosis not present

## 2021-10-03 DIAGNOSIS — J209 Acute bronchitis, unspecified: Secondary | ICD-10-CM | POA: Diagnosis not present

## 2021-10-03 DIAGNOSIS — R059 Cough, unspecified: Secondary | ICD-10-CM | POA: Diagnosis not present

## 2021-10-03 DIAGNOSIS — R0981 Nasal congestion: Secondary | ICD-10-CM | POA: Diagnosis not present

## 2021-10-04 ENCOUNTER — Telehealth: Payer: Self-pay | Admitting: Physician Assistant

## 2021-10-04 DIAGNOSIS — M4150 Other secondary scoliosis, site unspecified: Secondary | ICD-10-CM | POA: Diagnosis not present

## 2021-10-04 DIAGNOSIS — Z6826 Body mass index (BMI) 26.0-26.9, adult: Secondary | ICD-10-CM | POA: Diagnosis not present

## 2021-10-04 DIAGNOSIS — I10 Essential (primary) hypertension: Secondary | ICD-10-CM | POA: Diagnosis not present

## 2021-10-04 NOTE — Telephone Encounter (Signed)
Spoke with pt who reports he had Covid greater than 10 days ago.  Pt states he has lingering cough.  Pt advised he may come to appointment but will need to remain masked.  Pt verbalizes understanding and agrees with current plan.

## 2021-10-04 NOTE — Telephone Encounter (Signed)
New Message:   Patient have an appointment on  Monday(10-09-21) with renee. His question is, he have had Covid, testing Negative, but still have a lingering call. Should he reschedule, or can he keep his appointment?

## 2021-10-09 ENCOUNTER — Telehealth: Payer: Self-pay | Admitting: Hematology and Oncology

## 2021-10-09 ENCOUNTER — Ambulatory Visit: Payer: PPO | Admitting: Physician Assistant

## 2021-10-09 NOTE — Telephone Encounter (Signed)
Scheduled appointments per los, patient has been called and notified of upcoming appointments.

## 2021-10-10 DIAGNOSIS — M5115 Intervertebral disc disorders with radiculopathy, thoracolumbar region: Secondary | ICD-10-CM | POA: Diagnosis not present

## 2021-10-10 DIAGNOSIS — M5415 Radiculopathy, thoracolumbar region: Secondary | ICD-10-CM | POA: Diagnosis not present

## 2021-10-11 ENCOUNTER — Inpatient Hospital Stay: Payer: PPO

## 2021-10-11 ENCOUNTER — Other Ambulatory Visit: Payer: Self-pay | Admitting: Hematology and Oncology

## 2021-10-11 ENCOUNTER — Inpatient Hospital Stay: Payer: PPO | Attending: Hematology and Oncology | Admitting: Hematology and Oncology

## 2021-10-11 ENCOUNTER — Other Ambulatory Visit: Payer: Self-pay

## 2021-10-11 VITALS — BP 128/88 | HR 92 | Temp 98.1°F | Resp 18 | Wt 167.2 lb

## 2021-10-11 DIAGNOSIS — M549 Dorsalgia, unspecified: Secondary | ICD-10-CM | POA: Diagnosis not present

## 2021-10-11 DIAGNOSIS — Z5112 Encounter for antineoplastic immunotherapy: Secondary | ICD-10-CM | POA: Insufficient documentation

## 2021-10-11 DIAGNOSIS — C155 Malignant neoplasm of lower third of esophagus: Secondary | ICD-10-CM | POA: Diagnosis not present

## 2021-10-11 DIAGNOSIS — C792 Secondary malignant neoplasm of skin: Secondary | ICD-10-CM | POA: Insufficient documentation

## 2021-10-11 DIAGNOSIS — Z5111 Encounter for antineoplastic chemotherapy: Secondary | ICD-10-CM | POA: Insufficient documentation

## 2021-10-11 DIAGNOSIS — Z8616 Personal history of COVID-19: Secondary | ICD-10-CM | POA: Diagnosis not present

## 2021-10-11 DIAGNOSIS — F17210 Nicotine dependence, cigarettes, uncomplicated: Secondary | ICD-10-CM | POA: Diagnosis not present

## 2021-10-11 DIAGNOSIS — Z923 Personal history of irradiation: Secondary | ICD-10-CM | POA: Diagnosis not present

## 2021-10-11 DIAGNOSIS — C153 Malignant neoplasm of upper third of esophagus: Secondary | ICD-10-CM | POA: Diagnosis not present

## 2021-10-11 DIAGNOSIS — Z95828 Presence of other vascular implants and grafts: Secondary | ICD-10-CM

## 2021-10-11 DIAGNOSIS — C159 Malignant neoplasm of esophagus, unspecified: Secondary | ICD-10-CM

## 2021-10-11 DIAGNOSIS — R634 Abnormal weight loss: Secondary | ICD-10-CM | POA: Insufficient documentation

## 2021-10-11 LAB — CMP (CANCER CENTER ONLY)
ALT: 13 U/L (ref 0–44)
AST: 24 U/L (ref 15–41)
Albumin: 3.4 g/dL — ABNORMAL LOW (ref 3.5–5.0)
Alkaline Phosphatase: 129 U/L — ABNORMAL HIGH (ref 38–126)
Anion gap: 7 (ref 5–15)
BUN: 13 mg/dL (ref 8–23)
CO2: 28 mmol/L (ref 22–32)
Calcium: 9.5 mg/dL (ref 8.9–10.3)
Chloride: 96 mmol/L — ABNORMAL LOW (ref 98–111)
Creatinine: 0.73 mg/dL (ref 0.61–1.24)
GFR, Estimated: >60 mL/min (ref >60)
Glucose, Bld: 108 mg/dL — ABNORMAL HIGH (ref 70–99)
Potassium: 4.1 mmol/L (ref 3.5–5.1)
Sodium: 131 mmol/L — ABNORMAL LOW (ref 135–145)
Total Bilirubin: 0.5 mg/dL (ref 0.3–1.2)
Total Protein: 6.5 g/dL (ref 6.5–8.1)

## 2021-10-11 LAB — CBC WITH DIFFERENTIAL (CANCER CENTER ONLY)
Abs Immature Granulocytes: 0.18 10*3/uL — ABNORMAL HIGH (ref 0.00–0.07)
Basophils Absolute: 0 10*3/uL (ref 0.0–0.1)
Basophils Relative: 0 %
Eosinophils Absolute: 0 10*3/uL (ref 0.0–0.5)
Eosinophils Relative: 0 %
HCT: 36 % — ABNORMAL LOW (ref 39.0–52.0)
Hemoglobin: 12.1 g/dL — ABNORMAL LOW (ref 13.0–17.0)
Immature Granulocytes: 2 %
Lymphocytes Relative: 6 %
Lymphs Abs: 0.6 10*3/uL — ABNORMAL LOW (ref 0.7–4.0)
MCH: 33.1 pg (ref 26.0–34.0)
MCHC: 33.6 g/dL (ref 30.0–36.0)
MCV: 98.4 fL (ref 80.0–100.0)
Monocytes Absolute: 0.6 10*3/uL (ref 0.1–1.0)
Monocytes Relative: 6 %
Neutro Abs: 9.2 10*3/uL — ABNORMAL HIGH (ref 1.7–7.7)
Neutrophils Relative %: 86 %
Platelet Count: 223 10*3/uL (ref 150–400)
RBC: 3.66 MIL/uL — ABNORMAL LOW (ref 4.22–5.81)
RDW: 16.1 % — ABNORMAL HIGH (ref 11.5–15.5)
WBC Count: 10.7 10*3/uL — ABNORMAL HIGH (ref 4.0–10.5)
nRBC: 0 % (ref 0.0–0.2)

## 2021-10-11 LAB — MAGNESIUM: Magnesium: 1.8 mg/dL (ref 1.7–2.4)

## 2021-10-11 LAB — TSH: TSH: 0.274 u[IU]/mL — ABNORMAL LOW (ref 0.320–4.118)

## 2021-10-11 MED ORDER — DEXTROSE 5 % IV SOLN: Freq: Once | INTRAVENOUS | Status: AC

## 2021-10-11 MED ORDER — SODIUM CHLORIDE 0.9 % IV SOLN
240.0000 mg | Freq: Once | INTRAVENOUS | Status: AC
  Administered 2021-10-11: 13:00:00 240 mg via INTRAVENOUS
  Filled 2021-10-11: qty 24

## 2021-10-11 MED ORDER — SODIUM CHLORIDE 0.9% FLUSH: 10.0000 mL | INTRAVENOUS | Status: DC | PRN

## 2021-10-11 MED ORDER — FLUOROURACIL CHEMO INJECTION 2.5 GM/50ML
400.0000 mg/m2 | Freq: Once | INTRAVENOUS | Status: AC
  Administered 2021-10-11: 16:00:00 800 mg via INTRAVENOUS
  Filled 2021-10-11: qty 16

## 2021-10-11 MED ORDER — OXALIPLATIN CHEMO INJECTION 100 MG/20ML
85.0000 mg/m2 | Freq: Once | INTRAVENOUS | Status: AC
  Administered 2021-10-11: 170 mg via INTRAVENOUS
  Filled 2021-10-11: qty 34

## 2021-10-11 MED ORDER — HEPARIN SOD (PORK) LOCK FLUSH 100 UNIT/ML IV SOLN: 500.0000 [IU] | Freq: Once | INTRAVENOUS | Status: DC | PRN

## 2021-10-11 MED ORDER — SODIUM CHLORIDE 0.9% FLUSH
10.0000 mL | Freq: Once | INTRAVENOUS | Status: AC
  Administered 2021-10-11: 10:00:00 10 mL

## 2021-10-11 MED ORDER — SODIUM CHLORIDE 0.9 % IV SOLN: Freq: Once | INTRAVENOUS | Status: AC

## 2021-10-11 MED ORDER — SODIUM CHLORIDE 0.9 % IV SOLN
10.0000 mg | Freq: Once | INTRAVENOUS | Status: AC
  Administered 2021-10-11: 12:00:00 10 mg via INTRAVENOUS
  Filled 2021-10-11: qty 10

## 2021-10-11 MED ORDER — PALONOSETRON HCL INJECTION 0.25 MG/5ML
0.2500 mg | Freq: Once | INTRAVENOUS | Status: AC
  Administered 2021-10-11: 12:00:00 0.25 mg via INTRAVENOUS
  Filled 2021-10-11: qty 5

## 2021-10-11 MED ORDER — LEUCOVORIN CALCIUM INJECTION 350 MG
400.0000 mg/m2 | Freq: Once | INTRAVENOUS | Status: AC
  Administered 2021-10-11: 14:00:00 808 mg via INTRAVENOUS
  Filled 2021-10-11: qty 40.4

## 2021-10-11 MED ORDER — SODIUM CHLORIDE 0.9 % IV SOLN
2400.0000 mg/m2 | INTRAVENOUS | Status: DC
  Administered 2021-10-11: 16:00:00 4850 mg via INTRAVENOUS
  Filled 2021-10-11: qty 97

## 2021-10-12 DIAGNOSIS — E1121 Type 2 diabetes mellitus with diabetic nephropathy: Secondary | ICD-10-CM | POA: Diagnosis not present

## 2021-10-12 DIAGNOSIS — Z1389 Encounter for screening for other disorder: Secondary | ICD-10-CM | POA: Diagnosis not present

## 2021-10-12 DIAGNOSIS — F17200 Nicotine dependence, unspecified, uncomplicated: Secondary | ICD-10-CM | POA: Diagnosis not present

## 2021-10-12 DIAGNOSIS — F324 Major depressive disorder, single episode, in partial remission: Secondary | ICD-10-CM | POA: Diagnosis not present

## 2021-10-12 DIAGNOSIS — Z0000 Encounter for general adult medical examination without abnormal findings: Secondary | ICD-10-CM | POA: Diagnosis not present

## 2021-10-12 DIAGNOSIS — J441 Chronic obstructive pulmonary disease with (acute) exacerbation: Secondary | ICD-10-CM | POA: Diagnosis not present

## 2021-10-12 DIAGNOSIS — C159 Malignant neoplasm of esophagus, unspecified: Secondary | ICD-10-CM | POA: Diagnosis not present

## 2021-10-12 DIAGNOSIS — E785 Hyperlipidemia, unspecified: Secondary | ICD-10-CM | POA: Diagnosis not present

## 2021-10-12 DIAGNOSIS — I10 Essential (primary) hypertension: Secondary | ICD-10-CM | POA: Diagnosis not present

## 2021-10-13 ENCOUNTER — Inpatient Hospital Stay: Payer: PPO

## 2021-10-13 ENCOUNTER — Other Ambulatory Visit: Payer: Self-pay

## 2021-10-13 VITALS — BP 115/71 | HR 85 | Temp 99.2°F | Resp 18

## 2021-10-13 DIAGNOSIS — Z95828 Presence of other vascular implants and grafts: Secondary | ICD-10-CM

## 2021-10-13 DIAGNOSIS — Z5112 Encounter for antineoplastic immunotherapy: Secondary | ICD-10-CM | POA: Diagnosis not present

## 2021-10-13 MED ORDER — HEPARIN SOD (PORK) LOCK FLUSH 100 UNIT/ML IV SOLN
500.0000 [IU] | Freq: Once | INTRAVENOUS | Status: AC
  Administered 2021-10-13: 500 [IU]

## 2021-10-13 MED ORDER — SODIUM CHLORIDE 0.9% FLUSH
10.0000 mL | Freq: Once | INTRAVENOUS | Status: AC
  Administered 2021-10-13: 10 mL

## 2021-10-15 ENCOUNTER — Encounter: Payer: Self-pay | Admitting: Hematology and Oncology

## 2021-10-15 NOTE — Progress Notes (Signed)
[place] [phone]   [phone]  PROGRESS NOTE  Patient Care Team: Carol Ada, MD as PCP - General (Family Medicine) Evans Lance, MD as PCP - Cardiology (Cardiology) Orson Slick, MD as Consulting Physician (Hematology and Oncology)  Hematological/Oncological History # Metastatic Adenocarcinoma of the Distal Esophagus 01/27/2021: dermatology performed a punch biopsy of the right superior lateral anterior neck. Finding show adenocarcinoma negative for PSA and cytokeratin 20.  02/06/2021: establish care with Dr. Lorenso Courier  02/15/2021: EGD found esophageal mass. Biopsy confirms poorly differentiated carcinoma 02/22/2021: PET CT scan shows FDG avid mass in distal esophagus and subcutaneous tissue or right neck.  03/22/2021-04/05/2021: palliative radiation to the esophageal mass.  04/26/2021: Cycle 1 Day 1 of FOLFOX + Nivolumab 05/10/2021: Cycle 2 Day 1 of FOLFOX + Nivolumab 05/24/2021: Cycle 3 Day 1 of FOLFOX + Nivolumab 06/07/2021: Cycle 4 Day 1 of FOLFOX + Nivolumab 06/23/2021: Cycle 5 Day 1 of FOLFOX + Nivolumab 07/05/2021: Cycle 6 Day 1 of FOLFOX + Nivolumab 08/15/2021: Cycle 7 Day 1 of FOLFOX + Nivolumab (delayed due to hospitalization for N/V and dehydration) 08/29/2021: Cycle 8 Day 1 of FOLFOX + Nivolumab  09/12/2021: Cycle 9 Day 1 of FOLFOX + Nivolumab  09/26/2021: cycle delayed due to COVID infection.  10/11/2021: Cycle 10 Day 1 of FOLFOX + Nivolumab   Interval History:  Justin Trujillo. 76 y.o. male with medical history significant for metastatic esophageal adenocarcinoma who presents for a follow up visit. The patient's last visit was on 09/12/2021. In the interim, Justin Trujillo was completed cycle 9 of treatment.  The start of cycle 10 was delayed by COVID infection.  On exam today Justin Trujillo reports he continues to struggle with back pain.  He recently received steroid injections in the spine and his Percocet was increased to Dilaudid.  He  notes that he is just tired of the back pain.  He notes the pain currently is 7 out of 10 in severity.  Otherwise "everything else is going well".  He notes he is eating well though he is currently losing weight.  He is doing his best to eat 2 meals per day at least and plenty of snacks.  He is not sure why his weight is dropping because of how well he is eating.  He reports that he would like to exercise but with the back pain he is simply not able to.  He notes that the cold intolerance he has from the oxaliplatin continues to last longer and longer.  He also notes that his COVID infection symptoms have subsequently resolved.  He denies fevers, chills, night sweats, chest pain, cough, mucositis or rash. He has no other complaints. Rest of the 10 point ROS is below.  MEDICAL HISTORY:  Past Medical History:  Diagnosis Date   Arthritis    Cancer ([place])    Coronary atherosclerosis of native coronary artery    Depression    Esophageal cancer (HCC)    Essential tremor    High cholesterol    Hypertension    Hypertension    Hypogonadism male    Obstructive chronic bronchitis with exacerbation (HCC)    Obstructive sleep apnea (adult) (pediatric)    no cpap   Other and unspecified hyperlipidemia    Other emphysema ([place])    Proteinuria 2019   has seen a nephrologist   Shortness of breath    with excertion   Situational stress    Type II or unspecified type  diabetes mellitus without mention of complication, not stated as uncontrolled    type 2   Unspecified essential hypertension     SURGICAL HISTORY: Past Surgical History:  Procedure Laterality Date   CARDIAC CATHETERIZATION     IR IMAGING GUIDED PORT INSERTION  04/24/2021   IR KYPHO LUMBAR INC FX REDUCE BONE BX UNI/BIL CANNULATION INC/IMAGING  06/13/2018   L lens implant     R knee replacement x2     REVERSE SHOULDER ARTHROPLASTY Left 03/26/2019   Procedure: REVERSE SHOULDER ARTHROPLASTY;  Surgeon: Justice Britain, MD;  Location: [facility];   Service: Orthopedics;  Laterality: Left;  120mn   TONSILLECTOMY     TONSILLECTOMY AND ADENOIDECTOMY     vascectomy      SOCIAL HISTORY: Social History   Socioeconomic History   Marital status: Married    Spouse name: Not on file   Number of children: Not on file   Years of education: Not on file   Highest education level: Not on file  Occupational History   Occupation: lLandscape architect Tobacco Use   Smoking status: Every Day    Packs/day: 1.00    Years: 55.00    Pack years: 55.00    Types: Cigarettes   Smokeless tobacco: Never  Vaping Use   Vaping Use: Never used  Substance and Sexual Activity   Alcohol use: Yes    Alcohol/week: 1.0 standard drink    Types: 1 Cans of beer per week    Comment: occasional beer   Drug use: Never   Sexual activity: Not Currently  Other Topics Concern   Not on file  Social History Narrative   ** Merged History Encounter **       Social Determinants of Health   Financial Resource Strain: Not on file  Food Insecurity: Not on file  Transportation Needs: Not on file  Physical Activity: Not on file  Stress: Not on file  Social Connections: Not on file  Intimate Partner Violence: Not on file    FAMILY HISTORY: Family History  Problem Relation Age of Onset   Emphysema Father    Heart disease Father    Clotting disorder Mother    Kidney cancer Child     ALLERGIES:  is allergic to iodinated contrast media, moxifloxacin, penicillins, shellfish-derived products, amoxicillin, chlorhexidine, other, and latex.  MEDICATIONS:  Current Outpatient Medications  Medication Sig Dispense Refill   HYDROmorphone (DILAUDID) 8 MG tablet 1 tablet as needed     acetaminophen (TYLENOL) 500 MG tablet Take 650 mg by mouth every 8 (eight) hours as needed for mild pain (pain).     albuterol (VENTOLIN HFA) 108 (90 Base) MCG/ACT inhaler Inhale 1 puff into the lungs every 6 (six) hours as needed for wheezing.     ALPRAZolam (XANAX) 0.5 MG  tablet Take 0.5 mg by mouth 2 (two) times daily as needed for anxiety.     amiodarone (PACERONE) 200 MG tablet Take 2 tablets (400 mg total) by mouth 2 (two) times daily for 7 days, THEN 1 tablet (200 mg total) daily. 90 tablet 3   apixaban (ELIQUIS) 5 MG TABS tablet Take 1 tablet (5 mg total) by mouth 2 (two) times daily. 60 tablet 2   atorvastatin (LIPITOR) 40 MG tablet Take 1 tablet (40 mg total) by mouth daily. 30 tablet 0   Calcium Carb-Cholecalciferol (CALCIUM 600+D3 PO) Take 1 tablet by mouth daily.     Cholecalciferol (VITAMIN D) 50 MCG (2000 UT) tablet Take 2,000 Units  by mouth daily.     doxycycline (VIBRA-TABS) 100 MG tablet Take by mouth.     escitalopram (LEXAPRO) 10 MG tablet Take 10 mg by mouth daily.     furosemide (LASIX) 40 MG tablet Take 1 tablet (40 mg total) by mouth daily. (Patient not taking: Reported on 09/12/2021) 30 tablet 1   gabapentin (NEURONTIN) 600 MG tablet Take 600 mg by mouth 3 (three) times daily.     lidocaine-prilocaine (EMLA) cream Apply 1 application topically as needed. 30 g 0   magnesium oxide (MAG-OX) 400 MG tablet Take 1 tablet (400 mg total) by mouth daily. 14 tablet 0   Multiple Vitamins-Minerals (CENTRUM SILVER) tablet daily in the afternoon.     MYRBETRIQ 50 MG TB24 tablet Take 50 mg by mouth daily.     ondansetron (ZOFRAN ODT) 4 MG disintegrating tablet Take 1 tablet (4 mg total) by mouth every 8 (eight) hours as needed for nausea or vomiting. 20 tablet 0   pantoprazole (PROTONIX) 40 MG tablet Take 1 tablet (40 mg total) by mouth 2 (two) times daily. 60 tablet 2   potassium chloride SA (KLOR-CON) 20 MEQ tablet Take 2 tablets (40 mEq total) by mouth daily. 60 tablet 1   predniSONE (DELTASONE) 50 MG tablet Take 1 tablet 13 hours, 7 hours and 1 hour prior to CT Scan 3 tablet 0   primidone (MYSOLINE) 50 MG tablet Take 1 tablet (50 mg total) by mouth in the morning and at bedtime. 180 tablet 2   prochlorperazine (COMPAZINE) 10 MG tablet Take 1 tablet (10  mg total) by mouth every 6 (six) hours as needed for nausea or vomiting. 30 tablet 0   rOPINIRole (REQUIP) 0.5 MG tablet Take 0.5 mg by mouth at bedtime.     Semaglutide,0.25 or 0.5MG/DOS, (OZEMPIC, 0.25 OR 0.5 MG/DOSE,) 2 MG/1.5ML SOPN Ozempic 0.25 mg or 0.5 mg (2 mg/1.5 mL) subcutaneous pen injector  INJECT 0.5 MG SUBCUTANEOUSLY ONCE WEEKLY     sucralfate (CARAFATE) 1 GM/10ML suspension Take 10 mLs (1 g total) by mouth 4 (four) times daily -  with meals and at bedtime. (Patient not taking: Reported on 09/12/2021) 420 mL 2   tamsulosin (FLOMAX) 0.4 MG CAPS capsule Take 0.4 mg by mouth 2 (two) times daily.     TESTOSTERONE IM Inject 1 mL into the muscle every 14 (fourteen) days.     tiZANidine (ZANAFLEX) 4 MG tablet Take 4 mg by mouth 3 (three) times daily as needed.     trolamine salicylate (ASPERCREME) 10 % cream Apply 1 application topically 2 (two) times daily as needed for muscle pain.      No current facility-administered medications for this visit.    REVIEW OF SYSTEMS:   Constitutional: ( - ) fevers, ( - )  chills , ( - ) night sweats Eyes: ( - ) blurriness of vision, ( - ) double vision, ( - ) watery eyes Ears, nose, mouth, throat, and face: ( - ) mucositis, ( - ) sore throat Respiratory: ( - ) cough, ( - ) dyspnea, ( - ) wheezes Cardiovascular: ( - ) palpitation, ( - ) chest discomfort, ( + ) lower extremity swelling Gastrointestinal:  ( - ) nausea, ( - ) heartburn, ( - ) change in bowel habits Skin: ( - ) abnormal skin rashes Lymphatics: ( - ) new lymphadenopathy, ( - ) easy bruising Neurological: ( - ) numbness, ( - ) tingling, ( - ) new weaknesses Behavioral/Psych: ( - ) mood change, ( - )  new changes  All other systems were reviewed with the patient and are negative.  PHYSICAL EXAMINATION: ECOG PERFORMANCE STATUS: 1 - Symptomatic but completely ambulatory  Vitals:   10/11/21 1019  BP: 128/88  Pulse: 92  Resp: 18  Temp: 98.1 F (36.7 C)  SpO2: 98%   Filed Weights    10/11/21 1019  Weight: 167 lb 4 oz (75.9 kg)    GENERAL: Well-appearing elderly Caucasian male, alert, no distress and comfortable SKIN: skin color, texture, turgor are normal, no rashes or significant lesions EYES: conjunctiva are pink and non-injected, sclera clear LUNGS: clear to auscultation and percussion with normal breathing effort HEART: regular rate & rhythm and no murmurs. Mild lower extremity edema, improved.  PSYCH: alert & oriented x 3, fluent speech NEURO: no focal motor/sensory deficits  LABORATORY DATA:  I have reviewed the data as listed CBC Latest Ref Rng & Units 10/11/2021 09/12/2021 08/29/2021  WBC 4.0 - 10.5 K/uL 10.7(H) 7.5 6.3  Hemoglobin 13.0 - 17.0 g/dL 12.1(L) 11.1(L) 10.6(L)  Hematocrit 39.0 - 52.0 % 36.0(L) 33.7(L) 32.6(L)  Platelets 150 - 400 K/uL 223 125(L) 173    CMP Latest Ref Rng & Units 10/11/2021 09/12/2021 08/29/2021  Glucose 70 - 99 mg/dL 108(H) 74 85  BUN 8 - 23 mg/dL _0 Creatinine 0.61 - 1.24 mg/dL 0.73 0.77 0.74  Sodium 135 - 145 mmol/L 131(L) 132(L) 134(L)  Potassium 3.5 - 5.1 mmol/L 4.1 4.1 4.1  Chloride 98 - 111 mmol/L 96(L) 100 101  CO2 22 - 32 mmol/L _1 Calcium 8.9 - 10.3 mg/dL 9.5 9.3 8.9  Total Protein 6.5 - 8.1 g/dL 6.5 6.5 6.3(L)  Total Bilirubin 0.3 - 1.2 mg/dL 0.5 0.4 0.5  Alkaline Phos 38 - 126 U/L 129(H) 94 86  AST 15 - 41 U/L _2 ALT 0 - 44 U/L _3 RADIOGRAPHIC STUDIES: I have personally reviewed the radiological images as listed and agreed with the findings in the report: FDG avid distal esophagus as well as subcutaneous none lymph node region in the right neck. No results found.  ASSESSMENT & PLAN EIVAN GALLINA 76 y.o. male with medical history significant for metastatic esophageal adenocarcinoma who presents for a follow up visit. The patient is negative for HER2 but has a high TPS score.  Therefore he is an excellent candidate for FOLFOX with nivolumab.    The regimen of FOLFOX with  nivolumab will be administered q 2 weeks with clinic visits prior to each infusion. This will be continued until progression or intolerance.   #Metastatic Adenocarcinoma of the Esophagus #Dysphagia -- Underwent palliative radiation in order to help alleviate his dysphagia. Patient now tolerating PO well.  --Unfortunately given the spread of this cancer to the skin we will need to treat this as metastatic disease with systemic therapy  -- Final testing on the tissue resulted with HER2 negative status, but TPS score of 60%.  Therefore he would be a good candidate for FOLFOX with nivolumab --Started Cycle 1 Day 1 of FOLFOX plus Nivolumab on 04/26/2021 with good tolerance.  --Restaging CT Scan form 07/13/2021 shows stable soft tissue thickening of the esophagus. No evidence of progression.  --Labs today reviewed and adequate for treatment. WBC 10.7, Hgb stable at 12.1, Plt 223 K. TSH levels are pending. CMP reviewed and requires no intervention. --Recommend to proceed with Cycle 10 Day 1 today.  --RTC in 2 weeks for Cycle 11.   #Supportive  Care -- chemotherapy education complete -- port placed -- zofran 47m q8H PRN and compazine 143mPO q6H for nausea -- EMLA cream for port  Orders Placed This Encounter  Procedures   CT CHEST WO CONTRAST    Standing Status:   Future    Standing Expiration Date:   10/11/2022    Order Specific Question:   [facility]    [facility]    Standing Status:   Future    Standing Expiration Date:   10/11/2022    Order Specific Question:   [facility]    [facility]  Order Specific Question:   Is Oral Contrast requested for this exam?    Answer:   Yes, Per Radiology protocol   All questions were answered. The patient knows to call the clinic with any problems, questions or concerns.  I have spent a total of 30 minutes minutes of face-to-face and non-face-to-face  time, preparing to see the patient, performing a medically appropriate examination, counseling and educating the patient, documenting clinical information in the electronic health record, and care coordination.   JoLedell PeoplesMD Department of Hematology/Oncology [facility]Har[facility] Hospitalhone: [phone]: [phone]: [email or web address]  10/15/2021 3:57 PM

## 2021-10-20 ENCOUNTER — Other Ambulatory Visit: Payer: Self-pay

## 2021-10-20 ENCOUNTER — Ambulatory Visit (HOSPITAL_COMMUNITY)
Admission: RE | Admit: 2021-10-20 | Discharge: 2021-10-20 | Disposition: A | Discharge status: Home / Self Care | Payer: PPO | Source: Ambulatory Visit | Attending: Hematology and Oncology | Admitting: Hematology and Oncology

## 2021-10-20 DIAGNOSIS — C159 Malignant neoplasm of esophagus, unspecified: Secondary | ICD-10-CM | POA: Diagnosis not present

## 2021-10-20 DIAGNOSIS — R918 Other nonspecific abnormal finding of lung field: Secondary | ICD-10-CM | POA: Diagnosis not present

## 2021-10-20 DIAGNOSIS — C155 Malignant neoplasm of lower third of esophagus: Secondary | ICD-10-CM | POA: Insufficient documentation

## 2021-10-20 DIAGNOSIS — K5900 Constipation, unspecified: Secondary | ICD-10-CM | POA: Diagnosis not present

## 2021-10-20 DIAGNOSIS — R0602 Shortness of breath: Secondary | ICD-10-CM | POA: Diagnosis not present

## 2021-10-20 DIAGNOSIS — N3289 Other specified disorders of bladder: Secondary | ICD-10-CM | POA: Diagnosis not present

## 2021-10-20 DIAGNOSIS — K2289 Other specified disease of esophagus: Secondary | ICD-10-CM | POA: Diagnosis not present

## 2021-10-24 DIAGNOSIS — Z6837 Body mass index (BMI) 37.0-37.9, adult: Secondary | ICD-10-CM | POA: Diagnosis not present

## 2021-10-24 DIAGNOSIS — I129 Hypertensive chronic kidney disease with stage 1 through stage 4 chronic kidney disease, or unspecified chronic kidney disease: Secondary | ICD-10-CM | POA: Diagnosis not present

## 2021-10-24 DIAGNOSIS — E440 Moderate protein-calorie malnutrition: Secondary | ICD-10-CM | POA: Diagnosis not present

## 2021-10-24 DIAGNOSIS — G894 Chronic pain syndrome: Secondary | ICD-10-CM | POA: Diagnosis not present

## 2021-10-24 DIAGNOSIS — E291 Testicular hypofunction: Secondary | ICD-10-CM | POA: Diagnosis not present

## 2021-10-24 DIAGNOSIS — E1122 Type 2 diabetes mellitus with diabetic chronic kidney disease: Secondary | ICD-10-CM | POA: Diagnosis not present

## 2021-10-24 DIAGNOSIS — C159 Malignant neoplasm of esophagus, unspecified: Secondary | ICD-10-CM | POA: Diagnosis not present

## 2021-10-24 DIAGNOSIS — M5440 Lumbago with sciatica, unspecified side: Secondary | ICD-10-CM | POA: Diagnosis not present

## 2021-10-24 DIAGNOSIS — N181 Chronic kidney disease, stage 1: Secondary | ICD-10-CM | POA: Diagnosis not present

## 2021-10-24 DIAGNOSIS — J441 Chronic obstructive pulmonary disease with (acute) exacerbation: Secondary | ICD-10-CM | POA: Diagnosis not present

## 2021-10-24 DIAGNOSIS — Z96651 Presence of right artificial knee joint: Secondary | ICD-10-CM | POA: Diagnosis not present

## 2021-10-24 DIAGNOSIS — F17210 Nicotine dependence, cigarettes, uncomplicated: Secondary | ICD-10-CM | POA: Diagnosis not present

## 2021-10-24 DIAGNOSIS — G4730 Sleep apnea, unspecified: Secondary | ICD-10-CM | POA: Diagnosis not present

## 2021-10-24 DIAGNOSIS — G2581 Restless legs syndrome: Secondary | ICD-10-CM | POA: Diagnosis not present

## 2021-10-24 DIAGNOSIS — G250 Essential tremor: Secondary | ICD-10-CM | POA: Diagnosis not present

## 2021-10-24 DIAGNOSIS — K219 Gastro-esophageal reflux disease without esophagitis: Secondary | ICD-10-CM | POA: Diagnosis not present

## 2021-10-24 DIAGNOSIS — F439 Reaction to severe stress, unspecified: Secondary | ICD-10-CM | POA: Diagnosis not present

## 2021-10-24 DIAGNOSIS — G4700 Insomnia, unspecified: Secondary | ICD-10-CM | POA: Diagnosis not present

## 2021-10-24 DIAGNOSIS — E7800 Pure hypercholesterolemia, unspecified: Secondary | ICD-10-CM | POA: Diagnosis not present

## 2021-10-24 DIAGNOSIS — I2510 Atherosclerotic heart disease of native coronary artery without angina pectoris: Secondary | ICD-10-CM | POA: Diagnosis not present

## 2021-10-24 DIAGNOSIS — F324 Major depressive disorder, single episode, in partial remission: Secondary | ICD-10-CM | POA: Diagnosis not present

## 2021-10-24 DIAGNOSIS — M1990 Unspecified osteoarthritis, unspecified site: Secondary | ICD-10-CM | POA: Diagnosis not present

## 2021-10-24 DIAGNOSIS — R1310 Dysphagia, unspecified: Secondary | ICD-10-CM | POA: Diagnosis not present

## 2021-10-25 ENCOUNTER — Other Ambulatory Visit: Payer: Self-pay

## 2021-10-25 ENCOUNTER — Other Ambulatory Visit: Payer: Self-pay | Admitting: Hematology and Oncology

## 2021-10-25 ENCOUNTER — Inpatient Hospital Stay: Payer: PPO

## 2021-10-25 ENCOUNTER — Inpatient Hospital Stay: Payer: PPO | Admitting: Hematology and Oncology

## 2021-10-25 ENCOUNTER — Inpatient Hospital Stay: Payer: PPO | Attending: Hematology and Oncology

## 2021-10-25 VITALS — BP 152/82 | HR 81 | Temp 97.6°F | Resp 17 | Wt 158.4 lb

## 2021-10-25 DIAGNOSIS — R197 Diarrhea, unspecified: Secondary | ICD-10-CM | POA: Diagnosis not present

## 2021-10-25 DIAGNOSIS — W19XXXA Unspecified fall, initial encounter: Secondary | ICD-10-CM | POA: Diagnosis present

## 2021-10-25 DIAGNOSIS — S0990XA Unspecified injury of head, initial encounter: Secondary | ICD-10-CM | POA: Diagnosis not present

## 2021-10-25 DIAGNOSIS — J438 Other emphysema: Secondary | ICD-10-CM | POA: Diagnosis present

## 2021-10-25 DIAGNOSIS — Z825 Family history of asthma and other chronic lower respiratory diseases: Secondary | ICD-10-CM

## 2021-10-25 DIAGNOSIS — J9601 Acute respiratory failure with hypoxia: Secondary | ICD-10-CM | POA: Diagnosis present

## 2021-10-25 DIAGNOSIS — J984 Other disorders of lung: Secondary | ICD-10-CM | POA: Diagnosis not present

## 2021-10-25 DIAGNOSIS — R4182 Altered mental status, unspecified: Secondary | ICD-10-CM | POA: Diagnosis present

## 2021-10-25 DIAGNOSIS — Z79891 Long term (current) use of opiate analgesic: Secondary | ICD-10-CM

## 2021-10-25 DIAGNOSIS — C159 Malignant neoplasm of esophagus, unspecified: Secondary | ICD-10-CM

## 2021-10-25 DIAGNOSIS — R0902 Hypoxemia: Secondary | ICD-10-CM | POA: Diagnosis not present

## 2021-10-25 DIAGNOSIS — I959 Hypotension, unspecified: Secondary | ICD-10-CM | POA: Diagnosis present

## 2021-10-25 DIAGNOSIS — Z95828 Presence of other vascular implants and grafts: Secondary | ICD-10-CM

## 2021-10-25 DIAGNOSIS — T451X5A Adverse effect of antineoplastic and immunosuppressive drugs, initial encounter: Secondary | ICD-10-CM | POA: Diagnosis present

## 2021-10-25 DIAGNOSIS — R531 Weakness: Secondary | ICD-10-CM | POA: Diagnosis not present

## 2021-10-25 DIAGNOSIS — E291 Testicular hypofunction: Secondary | ICD-10-CM | POA: Diagnosis present

## 2021-10-25 DIAGNOSIS — R000 Tachycardia, unspecified: Secondary | ICD-10-CM | POA: Diagnosis not present

## 2021-10-25 DIAGNOSIS — I480 Paroxysmal atrial fibrillation: Secondary | ICD-10-CM | POA: Diagnosis present

## 2021-10-25 DIAGNOSIS — M1990 Unspecified osteoarthritis, unspecified site: Secondary | ICD-10-CM | POA: Diagnosis present

## 2021-10-25 DIAGNOSIS — Z888 Allergy status to other drugs, medicaments and biological substances status: Secondary | ICD-10-CM

## 2021-10-25 DIAGNOSIS — J849 Interstitial pulmonary disease, unspecified: Secondary | ICD-10-CM | POA: Diagnosis not present

## 2021-10-25 DIAGNOSIS — E119 Type 2 diabetes mellitus without complications: Secondary | ICD-10-CM | POA: Diagnosis present

## 2021-10-25 DIAGNOSIS — D61810 Antineoplastic chemotherapy induced pancytopenia: Secondary | ICD-10-CM | POA: Diagnosis present

## 2021-10-25 DIAGNOSIS — I429 Cardiomyopathy, unspecified: Secondary | ICD-10-CM | POA: Diagnosis present

## 2021-10-25 DIAGNOSIS — Z7985 Long-term (current) use of injectable non-insulin antidiabetic drugs: Secondary | ICD-10-CM

## 2021-10-25 DIAGNOSIS — J189 Pneumonia, unspecified organism: Secondary | ICD-10-CM | POA: Diagnosis present

## 2021-10-25 DIAGNOSIS — Z79899 Other long term (current) drug therapy: Secondary | ICD-10-CM

## 2021-10-25 DIAGNOSIS — I2510 Atherosclerotic heart disease of native coronary artery without angina pectoris: Secondary | ICD-10-CM | POA: Diagnosis present

## 2021-10-25 DIAGNOSIS — Z96653 Presence of artificial knee joint, bilateral: Secondary | ICD-10-CM | POA: Diagnosis present

## 2021-10-25 DIAGNOSIS — Z8616 Personal history of COVID-19: Secondary | ICD-10-CM | POA: Diagnosis not present

## 2021-10-25 DIAGNOSIS — K8689 Other specified diseases of pancreas: Secondary | ICD-10-CM | POA: Diagnosis not present

## 2021-10-25 DIAGNOSIS — R0600 Dyspnea, unspecified: Secondary | ICD-10-CM | POA: Diagnosis not present

## 2021-10-25 DIAGNOSIS — Z961 Presence of intraocular lens: Secondary | ICD-10-CM | POA: Diagnosis present

## 2021-10-25 DIAGNOSIS — C155 Malignant neoplasm of lower third of esophagus: Secondary | ICD-10-CM | POA: Diagnosis present

## 2021-10-25 DIAGNOSIS — I517 Cardiomegaly: Secondary | ICD-10-CM | POA: Diagnosis not present

## 2021-10-25 DIAGNOSIS — Z96612 Presence of left artificial shoulder joint: Secondary | ICD-10-CM | POA: Diagnosis present

## 2021-10-25 DIAGNOSIS — E871 Hypo-osmolality and hyponatremia: Secondary | ICD-10-CM | POA: Diagnosis present

## 2021-10-25 DIAGNOSIS — R296 Repeated falls: Secondary | ICD-10-CM | POA: Diagnosis present

## 2021-10-25 DIAGNOSIS — F32A Depression, unspecified: Secondary | ICD-10-CM | POA: Diagnosis present

## 2021-10-25 DIAGNOSIS — J329 Chronic sinusitis, unspecified: Secondary | ICD-10-CM | POA: Diagnosis not present

## 2021-10-25 DIAGNOSIS — E876 Hypokalemia: Secondary | ICD-10-CM | POA: Diagnosis not present

## 2021-10-25 DIAGNOSIS — I248 Other forms of acute ischemic heart disease: Secondary | ICD-10-CM | POA: Diagnosis present

## 2021-10-25 DIAGNOSIS — J811 Chronic pulmonary edema: Secondary | ICD-10-CM | POA: Diagnosis not present

## 2021-10-25 DIAGNOSIS — R778 Other specified abnormalities of plasma proteins: Secondary | ICD-10-CM | POA: Diagnosis not present

## 2021-10-25 DIAGNOSIS — I428 Other cardiomyopathies: Secondary | ICD-10-CM | POA: Diagnosis not present

## 2021-10-25 DIAGNOSIS — E8809 Other disorders of plasma-protein metabolism, not elsewhere classified: Secondary | ICD-10-CM | POA: Diagnosis present

## 2021-10-25 DIAGNOSIS — I10 Essential (primary) hypertension: Secondary | ICD-10-CM | POA: Diagnosis not present

## 2021-10-25 DIAGNOSIS — K449 Diaphragmatic hernia without obstruction or gangrene: Secondary | ICD-10-CM | POA: Diagnosis not present

## 2021-10-25 DIAGNOSIS — E7800 Pure hypercholesterolemia, unspecified: Secondary | ICD-10-CM | POA: Diagnosis present

## 2021-10-25 DIAGNOSIS — N400 Enlarged prostate without lower urinary tract symptoms: Secondary | ICD-10-CM | POA: Diagnosis not present

## 2021-10-25 DIAGNOSIS — Z9889 Other specified postprocedural states: Secondary | ICD-10-CM | POA: Diagnosis not present

## 2021-10-25 DIAGNOSIS — E8770 Fluid overload, unspecified: Secondary | ICD-10-CM | POA: Diagnosis present

## 2021-10-25 DIAGNOSIS — R1111 Vomiting without nausea: Secondary | ICD-10-CM | POA: Diagnosis not present

## 2021-10-25 DIAGNOSIS — Z20822 Contact with and (suspected) exposure to covid-19: Secondary | ICD-10-CM | POA: Diagnosis present

## 2021-10-25 DIAGNOSIS — C153 Malignant neoplasm of upper third of esophagus: Secondary | ICD-10-CM | POA: Diagnosis not present

## 2021-10-25 DIAGNOSIS — Z9221 Personal history of antineoplastic chemotherapy: Secondary | ICD-10-CM

## 2021-10-25 DIAGNOSIS — Z91041 Radiographic dye allergy status: Secondary | ICD-10-CM

## 2021-10-25 DIAGNOSIS — Z79890 Hormone replacement therapy (postmenopausal): Secondary | ICD-10-CM

## 2021-10-25 DIAGNOSIS — S3991XA Unspecified injury of abdomen, initial encounter: Secondary | ICD-10-CM | POA: Diagnosis not present

## 2021-10-25 DIAGNOSIS — R918 Other nonspecific abnormal finding of lung field: Secondary | ICD-10-CM | POA: Diagnosis not present

## 2021-10-25 DIAGNOSIS — Z880 Allergy status to penicillin: Secondary | ICD-10-CM

## 2021-10-25 DIAGNOSIS — Z7901 Long term (current) use of anticoagulants: Secondary | ICD-10-CM

## 2021-10-25 DIAGNOSIS — E118 Type 2 diabetes mellitus with unspecified complications: Secondary | ICD-10-CM | POA: Diagnosis not present

## 2021-10-25 DIAGNOSIS — G9389 Other specified disorders of brain: Secondary | ICD-10-CM | POA: Diagnosis not present

## 2021-10-25 DIAGNOSIS — Z91040 Latex allergy status: Secondary | ICD-10-CM

## 2021-10-25 DIAGNOSIS — Z91013 Allergy to seafood: Secondary | ICD-10-CM

## 2021-10-25 DIAGNOSIS — S199XXA Unspecified injury of neck, initial encounter: Secondary | ICD-10-CM | POA: Diagnosis not present

## 2021-10-25 DIAGNOSIS — Z881 Allergy status to other antibiotic agents status: Secondary | ICD-10-CM

## 2021-10-25 DIAGNOSIS — I6782 Cerebral ischemia: Secondary | ICD-10-CM | POA: Diagnosis not present

## 2021-10-25 DIAGNOSIS — J449 Chronic obstructive pulmonary disease, unspecified: Secondary | ICD-10-CM | POA: Diagnosis not present

## 2021-10-25 DIAGNOSIS — S22080A Wedge compression fracture of T11-T12 vertebra, initial encounter for closed fracture: Secondary | ICD-10-CM | POA: Diagnosis not present

## 2021-10-25 DIAGNOSIS — F17210 Nicotine dependence, cigarettes, uncomplicated: Secondary | ICD-10-CM | POA: Diagnosis present

## 2021-10-25 LAB — CMP (CANCER CENTER ONLY)
ALT: 16 U/L (ref 0–44)
AST: 20 U/L (ref 15–41)
Albumin: 3.6 g/dL (ref 3.5–5.0)
Alkaline Phosphatase: 86 U/L (ref 38–126)
Anion gap: 5 (ref 5–15)
BUN: 12 mg/dL (ref 8–23)
CO2: 28 mmol/L (ref 22–32)
Calcium: 8.9 mg/dL (ref 8.9–10.3)
Chloride: 97 mmol/L — ABNORMAL LOW (ref 98–111)
Creatinine: 0.76 mg/dL (ref 0.61–1.24)
GFR, Estimated: >60 mL/min (ref >60)
Glucose, Bld: 81 mg/dL (ref 70–99)
Potassium: 4.1 mmol/L (ref 3.5–5.1)
Sodium: 130 mmol/L — ABNORMAL LOW (ref 135–145)
Total Bilirubin: 0.5 mg/dL (ref 0.3–1.2)
Total Protein: 6.2 g/dL — ABNORMAL LOW (ref 6.5–8.1)

## 2021-10-25 LAB — CBC WITH DIFFERENTIAL (CANCER CENTER ONLY)
Abs Immature Granulocytes: 0.02 10*3/uL (ref 0.00–0.07)
Basophils Absolute: 0 10*3/uL (ref 0.0–0.1)
Basophils Relative: 0 %
Eosinophils Absolute: 0 10*3/uL (ref 0.0–0.5)
Eosinophils Relative: 1 %
HCT: 31.7 % — ABNORMAL LOW (ref 39.0–52.0)
Hemoglobin: 10.5 g/dL — ABNORMAL LOW (ref 13.0–17.0)
Immature Granulocytes: 0 %
Lymphocytes Relative: 12 %
Lymphs Abs: 0.6 10*3/uL — ABNORMAL LOW (ref 0.7–4.0)
MCH: 32.9 pg (ref 26.0–34.0)
MCHC: 33.1 g/dL (ref 30.0–36.0)
MCV: 99.4 fL (ref 80.0–100.0)
Monocytes Absolute: 0.4 10*3/uL (ref 0.1–1.0)
Monocytes Relative: 8 %
Neutro Abs: 3.9 10*3/uL (ref 1.7–7.7)
Neutrophils Relative %: 79 %
Platelet Count: 133 10*3/uL — ABNORMAL LOW (ref 150–400)
RBC: 3.19 MIL/uL — ABNORMAL LOW (ref 4.22–5.81)
RDW: 16.7 % — ABNORMAL HIGH (ref 11.5–15.5)
WBC Count: 5 10*3/uL (ref 4.0–10.5)
nRBC: 0 % (ref 0.0–0.2)

## 2021-10-25 LAB — LACTATE DEHYDROGENASE: LDH: 187 U/L (ref 98–192)

## 2021-10-25 LAB — TSH: TSH: 0.935 u[IU]/mL (ref 0.320–4.118)

## 2021-10-25 MED ORDER — FLUOROURACIL CHEMO INJECTION 2.5 GM/50ML
400.0000 mg/m2 | Freq: Once | INTRAVENOUS | Status: AC
  Administered 2021-10-25: 800 mg via INTRAVENOUS
  Filled 2021-10-25: qty 16

## 2021-10-25 MED ORDER — LEUCOVORIN CALCIUM INJECTION 350 MG
400.0000 mg/m2 | Freq: Once | INTRAVENOUS | Status: AC
  Administered 2021-10-25: 808 mg via INTRAVENOUS
  Filled 2021-10-25: qty 40.4

## 2021-10-25 MED ORDER — SODIUM CHLORIDE 0.9 % IV SOLN
2400.0000 mg/m2 | INTRAVENOUS | Status: DC
  Filled 2021-10-25: qty 97

## 2021-10-25 MED ORDER — PALONOSETRON HCL INJECTION 0.25 MG/5ML
0.2500 mg | Freq: Once | INTRAVENOUS | Status: AC
  Administered 2021-10-25: 0.25 mg via INTRAVENOUS
  Filled 2021-10-25: qty 5

## 2021-10-25 MED ORDER — SODIUM CHLORIDE 0.9% FLUSH
10.0000 mL | Freq: Once | INTRAVENOUS | Status: AC
  Administered 2021-10-25: 10 mL

## 2021-10-25 MED ORDER — SODIUM CHLORIDE 0.9 % IV SOLN
240.0000 mg | Freq: Once | INTRAVENOUS | Status: AC
  Administered 2021-10-25: 240 mg via INTRAVENOUS
  Filled 2021-10-25: qty 24

## 2021-10-25 MED ORDER — OXALIPLATIN CHEMO INJECTION 100 MG/20ML
85.0000 mg/m2 | Freq: Once | INTRAVENOUS | Status: AC
  Administered 2021-10-25: 170 mg via INTRAVENOUS
  Filled 2021-10-25: qty 34

## 2021-10-25 MED ORDER — SODIUM CHLORIDE 0.9 % IV SOLN
2400.0000 mg/m2 | INTRAVENOUS | Status: DC
  Administered 2021-10-25: 4400 mg via INTRAVENOUS
  Filled 2021-10-25: qty 88

## 2021-10-25 MED ORDER — SODIUM CHLORIDE 0.9 % IV SOLN
10.0000 mg | Freq: Once | INTRAVENOUS | Status: AC
  Administered 2021-10-25: 10 mg via INTRAVENOUS
  Filled 2021-10-25: qty 10

## 2021-10-25 MED ORDER — DEXTROSE 5 % IV SOLN: Freq: Once | INTRAVENOUS | Status: AC

## 2021-10-25 NOTE — Patient Instructions (Signed)
[facility]  Discharge Instructions: Thank you for choosing [facility] to provide your oncology and hematology care.   If you have a lab appointment with the [facility], please go directly to the [facility] and check in at the registration area.   Wear comfortable clothing and clothing appropriate for easy access to any Portacath or PICC line.   We strive to give you quality time with your provider. You may need to reschedule your appointment if you arrive late (15 or more minutes).  Arriving late affects you and other patients whose appointments are after yours.  Also, if you miss three or more appointments without notifying the office, you may be dismissed from the clinic at the providers discretion.      For prescription refill requests, have your pharmacy contact our office and allow 72 hours for refills to be completed.    Today you received the following chemotherapy and/or immunotherapy agents: Opdivo, oxaliplatin, fluorouracil      To help prevent nausea and vomiting after your treatment, we encourage you to take your nausea medication as directed.  BELOW ARE SYMPTOMS THAT SHOULD BE REPORTED IMMEDIATELY: *FEVER GREATER THAN 100.4 F (38 C) OR HIGHER *CHILLS OR SWEATING *NAUSEA AND VOMITING THAT IS NOT CONTROLLED WITH YOUR NAUSEA MEDICATION *UNUSUAL SHORTNESS OF BREATH *UNUSUAL BRUISING OR BLEEDING *URINARY PROBLEMS (pain or burning when urinating, or frequent urination) *BOWEL PROBLEMS (unusual diarrhea, constipation, pain near the anus) TENDERNESS IN MOUTH AND THROAT WITH OR WITHOUT PRESENCE OF ULCERS (sore throat, sores in mouth, or a toothache) UNUSUAL RASH, SWELLING OR PAIN  UNUSUAL VAGINAL DISCHARGE OR ITCHING   Items with * indicate a potential emergency and should be followed up as soon as possible or go to the Emergency Department if any problems should occur.  Please show the CHEMOTHERAPY ALERT CARD or IMMUNOTHERAPY  ALERT CARD at check-in to the Emergency Department and triage nurse.  Should you have questions after your visit or need to cancel or reschedule your appointment, please contact [facility]  Dept: [phone]  and follow the prompts.  Office hours are 8:00 a.m. to 4:30 p.m. Monday - Friday. Please note that voicemails left after 4:00 p.m. may not be returned until the following business day.  We are closed weekends and major holidays. You have access to a nurse at all times for urgent questions. Please call the main number to the clinic Dept: [phone] and follow the prompts.   For any non-urgent questions, you may also contact your provider using MyChart. We now offer e-Visits for anyone 69 and older to request care online for non-urgent symptoms. For details visit [email or web address].   Also download the MyChart app! Go to the app store, search "MyChart", open the app, select [facility], and log in with your MyChart username and password.  Due to Covid, a mask is required upon entering the hospital/clinic. If you do not have a mask, one will be given to you upon arrival. For doctor visits, patients may have 1 support person aged 66 or older with them. For treatment visits, patients cannot have anyone with them due to current Covid guidelines and our immunocompromised population.

## 2021-10-25 NOTE — Progress Notes (Deleted)
Ok to proceed with same chemotherapy doses. Will monitor. May need to adjust chemo doses with next treatment.   Raul Del Waterford, [place], BCPS, BCOP 10/25/2021 11:44 AM

## 2021-10-26 ENCOUNTER — Inpatient Hospital Stay (HOSPITAL_COMMUNITY)
Admission: EM | Admit: 2021-10-26 | Discharge: 2021-10-30 | DRG: 193 | Disposition: A | Discharge status: Home Health | Payer: PPO | Attending: Internal Medicine | Admitting: Internal Medicine

## 2021-10-26 ENCOUNTER — Telehealth: Payer: Self-pay | Admitting: *Deleted

## 2021-10-26 ENCOUNTER — Emergency Department (HOSPITAL_COMMUNITY): Payer: PPO

## 2021-10-26 ENCOUNTER — Other Ambulatory Visit: Payer: Self-pay

## 2021-10-26 ENCOUNTER — Encounter (HOSPITAL_COMMUNITY): Payer: Self-pay | Admitting: Emergency Medicine

## 2021-10-26 ENCOUNTER — Encounter: Payer: Self-pay | Admitting: Hematology and Oncology

## 2021-10-26 DIAGNOSIS — E871 Hypo-osmolality and hyponatremia: Secondary | ICD-10-CM

## 2021-10-26 DIAGNOSIS — Z880 Allergy status to penicillin: Secondary | ICD-10-CM

## 2021-10-26 DIAGNOSIS — I428 Other cardiomyopathies: Secondary | ICD-10-CM | POA: Diagnosis not present

## 2021-10-26 DIAGNOSIS — D61810 Antineoplastic chemotherapy induced pancytopenia: Secondary | ICD-10-CM

## 2021-10-26 DIAGNOSIS — Z79891 Long term (current) use of opiate analgesic: Secondary | ICD-10-CM

## 2021-10-26 DIAGNOSIS — R778 Other specified abnormalities of plasma proteins: Secondary | ICD-10-CM

## 2021-10-26 DIAGNOSIS — C155 Malignant neoplasm of lower third of esophagus: Secondary | ICD-10-CM

## 2021-10-26 DIAGNOSIS — R197 Diarrhea, unspecified: Secondary | ICD-10-CM | POA: Diagnosis present

## 2021-10-26 DIAGNOSIS — J189 Pneumonia, unspecified organism: Secondary | ICD-10-CM | POA: Diagnosis present

## 2021-10-26 DIAGNOSIS — K8689 Other specified diseases of pancreas: Secondary | ICD-10-CM | POA: Diagnosis not present

## 2021-10-26 DIAGNOSIS — E118 Type 2 diabetes mellitus with unspecified complications: Secondary | ICD-10-CM | POA: Diagnosis not present

## 2021-10-26 DIAGNOSIS — Z20822 Contact with and (suspected) exposure to covid-19: Secondary | ICD-10-CM | POA: Diagnosis present

## 2021-10-26 DIAGNOSIS — J449 Chronic obstructive pulmonary disease, unspecified: Secondary | ICD-10-CM

## 2021-10-26 DIAGNOSIS — N400 Enlarged prostate without lower urinary tract symptoms: Secondary | ICD-10-CM

## 2021-10-26 DIAGNOSIS — Z7985 Long-term (current) use of injectable non-insulin antidiabetic drugs: Secondary | ICD-10-CM

## 2021-10-26 DIAGNOSIS — S22080A Wedge compression fracture of T11-T12 vertebra, initial encounter for closed fracture: Secondary | ICD-10-CM | POA: Diagnosis not present

## 2021-10-26 DIAGNOSIS — I10 Essential (primary) hypertension: Secondary | ICD-10-CM | POA: Diagnosis not present

## 2021-10-26 DIAGNOSIS — R059 Cough, unspecified: Secondary | ICD-10-CM

## 2021-10-26 DIAGNOSIS — Z91013 Allergy to seafood: Secondary | ICD-10-CM

## 2021-10-26 DIAGNOSIS — Z91041 Radiographic dye allergy status: Secondary | ICD-10-CM

## 2021-10-26 DIAGNOSIS — R1111 Vomiting without nausea: Secondary | ICD-10-CM | POA: Diagnosis not present

## 2021-10-26 DIAGNOSIS — E291 Testicular hypofunction: Secondary | ICD-10-CM | POA: Diagnosis present

## 2021-10-26 DIAGNOSIS — I480 Paroxysmal atrial fibrillation: Secondary | ICD-10-CM | POA: Diagnosis present

## 2021-10-26 DIAGNOSIS — I517 Cardiomegaly: Secondary | ICD-10-CM | POA: Diagnosis not present

## 2021-10-26 DIAGNOSIS — J984 Other disorders of lung: Secondary | ICD-10-CM | POA: Diagnosis not present

## 2021-10-26 DIAGNOSIS — I6782 Cerebral ischemia: Secondary | ICD-10-CM | POA: Diagnosis not present

## 2021-10-26 DIAGNOSIS — S199XXA Unspecified injury of neck, initial encounter: Secondary | ICD-10-CM | POA: Diagnosis not present

## 2021-10-26 DIAGNOSIS — E8809 Other disorders of plasma-protein metabolism, not elsewhere classified: Secondary | ICD-10-CM

## 2021-10-26 DIAGNOSIS — R296 Repeated falls: Secondary | ICD-10-CM | POA: Diagnosis present

## 2021-10-26 DIAGNOSIS — I959 Hypotension, unspecified: Secondary | ICD-10-CM | POA: Diagnosis not present

## 2021-10-26 DIAGNOSIS — E8770 Fluid overload, unspecified: Secondary | ICD-10-CM | POA: Diagnosis not present

## 2021-10-26 DIAGNOSIS — Z9221 Personal history of antineoplastic chemotherapy: Secondary | ICD-10-CM

## 2021-10-26 DIAGNOSIS — R0902 Hypoxemia: Secondary | ICD-10-CM

## 2021-10-26 DIAGNOSIS — J9601 Acute respiratory failure with hypoxia: Secondary | ICD-10-CM

## 2021-10-26 DIAGNOSIS — Z79899 Other long term (current) drug therapy: Secondary | ICD-10-CM

## 2021-10-26 DIAGNOSIS — M1990 Unspecified osteoarthritis, unspecified site: Secondary | ICD-10-CM | POA: Diagnosis present

## 2021-10-26 DIAGNOSIS — J849 Interstitial pulmonary disease, unspecified: Secondary | ICD-10-CM | POA: Diagnosis not present

## 2021-10-26 DIAGNOSIS — J438 Other emphysema: Secondary | ICD-10-CM | POA: Diagnosis present

## 2021-10-26 DIAGNOSIS — R000 Tachycardia, unspecified: Secondary | ICD-10-CM | POA: Diagnosis not present

## 2021-10-26 DIAGNOSIS — Z8616 Personal history of COVID-19: Secondary | ICD-10-CM | POA: Diagnosis not present

## 2021-10-26 DIAGNOSIS — Z96612 Presence of left artificial shoulder joint: Secondary | ICD-10-CM | POA: Diagnosis present

## 2021-10-26 DIAGNOSIS — T451X5A Adverse effect of antineoplastic and immunosuppressive drugs, initial encounter: Secondary | ICD-10-CM

## 2021-10-26 DIAGNOSIS — W19XXXA Unspecified fall, initial encounter: Secondary | ICD-10-CM | POA: Diagnosis present

## 2021-10-26 DIAGNOSIS — Z825 Family history of asthma and other chronic lower respiratory diseases: Secondary | ICD-10-CM

## 2021-10-26 DIAGNOSIS — E876 Hypokalemia: Secondary | ICD-10-CM

## 2021-10-26 DIAGNOSIS — E119 Type 2 diabetes mellitus without complications: Secondary | ICD-10-CM | POA: Diagnosis present

## 2021-10-26 DIAGNOSIS — S3991XA Unspecified injury of abdomen, initial encounter: Secondary | ICD-10-CM | POA: Diagnosis not present

## 2021-10-26 DIAGNOSIS — C153 Malignant neoplasm of upper third of esophagus: Secondary | ICD-10-CM | POA: Diagnosis not present

## 2021-10-26 DIAGNOSIS — I429 Cardiomyopathy, unspecified: Secondary | ICD-10-CM

## 2021-10-26 DIAGNOSIS — Z79890 Hormone replacement therapy (postmenopausal): Secondary | ICD-10-CM

## 2021-10-26 DIAGNOSIS — R4182 Altered mental status, unspecified: Secondary | ICD-10-CM | POA: Diagnosis present

## 2021-10-26 DIAGNOSIS — F32A Depression, unspecified: Secondary | ICD-10-CM | POA: Diagnosis present

## 2021-10-26 DIAGNOSIS — J811 Chronic pulmonary edema: Secondary | ICD-10-CM | POA: Diagnosis not present

## 2021-10-26 DIAGNOSIS — I2510 Atherosclerotic heart disease of native coronary artery without angina pectoris: Secondary | ICD-10-CM | POA: Diagnosis present

## 2021-10-26 DIAGNOSIS — E7800 Pure hypercholesterolemia, unspecified: Secondary | ICD-10-CM | POA: Diagnosis present

## 2021-10-26 DIAGNOSIS — Z96653 Presence of artificial knee joint, bilateral: Secondary | ICD-10-CM | POA: Diagnosis present

## 2021-10-26 DIAGNOSIS — F17210 Nicotine dependence, cigarettes, uncomplicated: Secondary | ICD-10-CM | POA: Diagnosis present

## 2021-10-26 DIAGNOSIS — J329 Chronic sinusitis, unspecified: Secondary | ICD-10-CM | POA: Diagnosis not present

## 2021-10-26 DIAGNOSIS — R531 Weakness: Secondary | ICD-10-CM

## 2021-10-26 DIAGNOSIS — I248 Other forms of acute ischemic heart disease: Secondary | ICD-10-CM | POA: Diagnosis present

## 2021-10-26 DIAGNOSIS — R0600 Dyspnea, unspecified: Secondary | ICD-10-CM | POA: Diagnosis not present

## 2021-10-26 DIAGNOSIS — Z7901 Long term (current) use of anticoagulants: Secondary | ICD-10-CM

## 2021-10-26 DIAGNOSIS — Z961 Presence of intraocular lens: Secondary | ICD-10-CM | POA: Diagnosis present

## 2021-10-26 DIAGNOSIS — Z9889 Other specified postprocedural states: Secondary | ICD-10-CM | POA: Diagnosis not present

## 2021-10-26 DIAGNOSIS — R0602 Shortness of breath: Secondary | ICD-10-CM

## 2021-10-26 DIAGNOSIS — G9389 Other specified disorders of brain: Secondary | ICD-10-CM | POA: Diagnosis not present

## 2021-10-26 DIAGNOSIS — S0990XA Unspecified injury of head, initial encounter: Secondary | ICD-10-CM | POA: Diagnosis not present

## 2021-10-26 DIAGNOSIS — T1490XA Injury, unspecified, initial encounter: Secondary | ICD-10-CM

## 2021-10-26 DIAGNOSIS — Z91040 Latex allergy status: Secondary | ICD-10-CM

## 2021-10-26 DIAGNOSIS — K449 Diaphragmatic hernia without obstruction or gangrene: Secondary | ICD-10-CM | POA: Diagnosis not present

## 2021-10-26 DIAGNOSIS — Z888 Allergy status to other drugs, medicaments and biological substances status: Secondary | ICD-10-CM

## 2021-10-26 DIAGNOSIS — R918 Other nonspecific abnormal finding of lung field: Secondary | ICD-10-CM | POA: Diagnosis not present

## 2021-10-26 DIAGNOSIS — R7989 Other specified abnormal findings of blood chemistry: Secondary | ICD-10-CM | POA: Diagnosis present

## 2021-10-26 DIAGNOSIS — Z881 Allergy status to other antibiotic agents status: Secondary | ICD-10-CM

## 2021-10-26 LAB — I-STAT ARTERIAL BLOOD GAS, ED
Acid-base deficit: 2 mmol/L (ref 0.0–2.0)
Bicarbonate: 22.5 mmol/L (ref 20.0–28.0)
Calcium, Ion: 1.2 mmol/L (ref 1.15–1.40)
HCT: 39 % (ref 39.0–52.0)
Hemoglobin: 13.3 g/dL (ref 13.0–17.0)
O2 Saturation: 95 %
Patient temperature: 98.6
Potassium: 3.3 mmol/L — ABNORMAL LOW (ref 3.5–5.1)
Sodium: 129 mmol/L — ABNORMAL LOW (ref 135–145)
TCO2: 24 mmol/L (ref 22–32)
pCO2 arterial: 36.3 mmHg (ref 32.0–48.0)
pH, Arterial: 7.401 (ref 7.350–7.450)
pO2, Arterial: 76 mmHg — ABNORMAL LOW (ref 83.0–108.0)

## 2021-10-26 LAB — AMMONIA: Ammonia: 19 umol/L (ref 9–35)

## 2021-10-26 LAB — URINALYSIS, ROUTINE W REFLEX MICROSCOPIC
Bacteria, UA: NONE SEEN
Bilirubin Urine: NEGATIVE
Glucose, UA: 50 mg/dL — AB
Hgb urine dipstick: NEGATIVE
Ketones, ur: NEGATIVE mg/dL
Leukocytes,Ua: NEGATIVE
Nitrite: NEGATIVE
Protein, ur: 100 mg/dL — AB
Specific Gravity, Urine: 1.025 (ref 1.005–1.030)
pH: 5 (ref 5.0–8.0)

## 2021-10-26 LAB — PROTIME-INR
INR: 1.4 — ABNORMAL HIGH (ref 0.8–1.2)
Prothrombin Time: 16.7 seconds — ABNORMAL HIGH (ref 11.4–15.2)

## 2021-10-26 LAB — BRAIN NATRIURETIC PEPTIDE: B Natriuretic Peptide: 214.5 pg/mL — ABNORMAL HIGH (ref 0.0–100.0)

## 2021-10-26 LAB — COMPREHENSIVE METABOLIC PANEL
ALT: 25 U/L (ref 0–44)
AST: 43 U/L — ABNORMAL HIGH (ref 15–41)
Albumin: 2.9 g/dL — ABNORMAL LOW (ref 3.5–5.0)
Alkaline Phosphatase: 73 U/L (ref 38–126)
Anion gap: 12 (ref 5–15)
BUN: 16 mg/dL (ref 8–23)
CO2: 21 mmol/L — ABNORMAL LOW (ref 22–32)
Calcium: 8.5 mg/dL — ABNORMAL LOW (ref 8.9–10.3)
Chloride: 96 mmol/L — ABNORMAL LOW (ref 98–111)
Creatinine, Ser: 1.06 mg/dL (ref 0.61–1.24)
GFR, Estimated: >60 mL/min (ref >60)
Glucose, Bld: 178 mg/dL — ABNORMAL HIGH (ref 70–99)
Potassium: 4.2 mmol/L (ref 3.5–5.1)
Sodium: 129 mmol/L — ABNORMAL LOW (ref 135–145)
Total Bilirubin: 0.8 mg/dL (ref 0.3–1.2)
Total Protein: 5.6 g/dL — ABNORMAL LOW (ref 6.5–8.1)

## 2021-10-26 LAB — SODIUM, URINE, RANDOM: Sodium, Ur: 35 mmol/L

## 2021-10-26 LAB — I-STAT CHEM 8, ED
BUN: 20 mg/dL (ref 8–23)
Calcium, Ion: 1.01 mmol/L — ABNORMAL LOW (ref 1.15–1.40)
Chloride: 96 mmol/L — ABNORMAL LOW (ref 98–111)
Creatinine, Ser: 0.8 mg/dL (ref 0.61–1.24)
Glucose, Bld: 174 mg/dL — ABNORMAL HIGH (ref 70–99)
HCT: 40 % (ref 39.0–52.0)
Hemoglobin: 13.6 g/dL (ref 13.0–17.0)
Potassium: 4.1 mmol/L (ref 3.5–5.1)
Sodium: 127 mmol/L — ABNORMAL LOW (ref 135–145)
TCO2: 23 mmol/L (ref 22–32)

## 2021-10-26 LAB — SAMPLE TO BLOOD BANK

## 2021-10-26 LAB — CBC
HCT: 39.7 % (ref 39.0–52.0)
Hemoglobin: 12.9 g/dL — ABNORMAL LOW (ref 13.0–17.0)
MCH: 33.2 pg (ref 26.0–34.0)
MCHC: 32.5 g/dL (ref 30.0–36.0)
MCV: 102.1 fL — ABNORMAL HIGH (ref 80.0–100.0)
Platelets: 114 10*3/uL — ABNORMAL LOW (ref 150–400)
RBC: 3.89 MIL/uL — ABNORMAL LOW (ref 4.22–5.81)
RDW: 17.2 % — ABNORMAL HIGH (ref 11.5–15.5)
WBC: 4.1 10*3/uL (ref 4.0–10.5)
nRBC: 0 % (ref 0.0–0.2)

## 2021-10-26 LAB — RESP PANEL BY RT-PCR (FLU A&B, COVID) ARPGX2
Influenza A by PCR: NEGATIVE
Influenza B by PCR: NEGATIVE
SARS Coronavirus 2 by RT PCR: NEGATIVE

## 2021-10-26 LAB — LACTIC ACID, PLASMA
Lactic Acid, Venous: 3 mmol/L (ref 0.5–1.9)
Lactic Acid, Venous: 1.7 mmol/L (ref 0.5–1.9)

## 2021-10-26 LAB — ETHANOL: Alcohol, Ethyl (B): <10 mg/dL (ref <10)

## 2021-10-26 LAB — TROPONIN I (HIGH SENSITIVITY)
Troponin I (High Sensitivity): 37 ng/L — ABNORMAL HIGH (ref <18)
Troponin I (High Sensitivity): 44 ng/L — ABNORMAL HIGH (ref <18)

## 2021-10-26 LAB — OSMOLALITY, URINE: Osmolality, Ur: 718 mOsm/kg (ref 300–900)

## 2021-10-26 LAB — MAGNESIUM: Magnesium: 1.8 mg/dL (ref 1.7–2.4)

## 2021-10-26 MED ORDER — ATORVASTATIN CALCIUM 40 MG PO TABS
40.0000 mg | ORAL_TABLET | Freq: Every day | ORAL | Status: DC
  Administered 2021-10-27 – 2021-10-30 (×4): 40 mg via ORAL
  Filled 2021-10-26 (×4): qty 1

## 2021-10-26 MED ORDER — SODIUM CHLORIDE 0.9% FLUSH: 10.0000 mL | INTRAVENOUS | Status: DC | PRN

## 2021-10-26 MED ORDER — SODIUM CHLORIDE 0.9% FLUSH
3.0000 mL | Freq: Two times a day (BID) | INTRAVENOUS | Status: DC
  Administered 2021-10-27: 3 mL via INTRAVENOUS

## 2021-10-26 MED ORDER — SODIUM CHLORIDE 0.9 % IV SOLN: INTRAVENOUS | Status: DC

## 2021-10-26 MED ORDER — DOXYCYCLINE HYCLATE 100 MG PO TABS
100.0000 mg | ORAL_TABLET | Freq: Two times a day (BID) | ORAL | Status: DC
  Administered 2021-10-26 – 2021-10-30 (×8): 100 mg via ORAL
  Filled 2021-10-26 (×8): qty 1

## 2021-10-26 MED ORDER — HYDROMORPHONE HCL 2 MG PO TABS
8.0000 mg | ORAL_TABLET | Freq: Four times a day (QID) | ORAL | Status: DC | PRN
  Administered 2021-10-28 – 2021-10-30 (×7): 8 mg via ORAL
  Filled 2021-10-26 (×7): qty 4

## 2021-10-26 MED ORDER — TIZANIDINE HCL 4 MG PO TABS
4.0000 mg | ORAL_TABLET | Freq: Three times a day (TID) | ORAL | Status: DC | PRN
  Administered 2021-10-28 – 2021-10-29 (×2): 4 mg via ORAL
  Filled 2021-10-26 (×2): qty 1

## 2021-10-26 MED ORDER — SODIUM CHLORIDE 0.9 % IV BOLUS
500.0000 mL | Freq: Once | INTRAVENOUS | Status: AC
Start: 2021-10-26 — End: 2021-10-26
  Administered 2021-10-26: 500 mL via INTRAVENOUS

## 2021-10-26 MED ORDER — TAMSULOSIN HCL 0.4 MG PO CAPS
0.4000 mg | ORAL_CAPSULE | Freq: Two times a day (BID) | ORAL | Status: DC
  Administered 2021-10-27 – 2021-10-30 (×8): 0.4 mg via ORAL
  Filled 2021-10-26 (×8): qty 1

## 2021-10-26 MED ORDER — GLUCERNA SHAKE PO LIQD
237.0000 mL | Freq: Three times a day (TID) | ORAL | Status: DC
  Administered 2021-10-27 – 2021-10-29 (×6): 237 mL via ORAL

## 2021-10-26 MED ORDER — ALBUTEROL SULFATE (2.5 MG/3ML) 0.083% IN NEBU: 2.5000 mg | INHALATION_SOLUTION | Freq: Four times a day (QID) | RESPIRATORY_TRACT | Status: DC | PRN

## 2021-10-26 MED ORDER — PANTOPRAZOLE SODIUM 40 MG PO TBEC
40.0000 mg | DELAYED_RELEASE_TABLET | Freq: Two times a day (BID) | ORAL | Status: DC
  Administered 2021-10-26 – 2021-10-30 (×8): 40 mg via ORAL
  Filled 2021-10-26 (×8): qty 1

## 2021-10-26 MED ORDER — SODIUM CHLORIDE 0.9 % IV SOLN
2.0000 g | INTRAVENOUS | Status: DC
  Administered 2021-10-26 – 2021-10-30 (×5): 2 g via INTRAVENOUS
  Filled 2021-10-26 (×6): qty 20

## 2021-10-26 MED ORDER — INSULIN ASPART 100 UNIT/ML IJ SOLN
0.0000 [IU] | Freq: Three times a day (TID) | INTRAMUSCULAR | Status: DC
  Administered 2021-10-28: 1 [IU] via SUBCUTANEOUS

## 2021-10-26 MED ORDER — ONDANSETRON HCL 4 MG/2ML IJ SOLN: 4.0000 mg | Freq: Four times a day (QID) | INTRAMUSCULAR | Status: DC | PRN

## 2021-10-26 MED ORDER — SODIUM CHLORIDE 0.9% FLUSH
10.0000 mL | Freq: Two times a day (BID) | INTRAVENOUS | Status: DC
  Administered 2021-10-27 – 2021-10-30 (×5): 10 mL

## 2021-10-26 MED ORDER — SUCRALFATE 1 GM/10ML PO SUSP
1.0000 g | Freq: Three times a day (TID) | ORAL | Status: DC
  Administered 2021-10-26 – 2021-10-27 (×2): 1 g via ORAL
  Filled 2021-10-26 (×4): qty 10

## 2021-10-26 MED ORDER — MAGNESIUM OXIDE -MG SUPPLEMENT 400 (240 MG) MG PO TABS
400.0000 mg | ORAL_TABLET | Freq: Every day | ORAL | Status: DC
  Administered 2021-10-26 – 2021-10-30 (×5): 400 mg via ORAL
  Filled 2021-10-26 (×5): qty 1

## 2021-10-26 MED ORDER — ALPRAZOLAM 0.5 MG PO TABS
0.5000 mg | ORAL_TABLET | Freq: Two times a day (BID) | ORAL | Status: DC | PRN
  Administered 2021-10-27 – 2021-10-28 (×2): 0.5 mg via ORAL
  Filled 2021-10-26 (×2): qty 1

## 2021-10-26 MED ORDER — ACETAMINOPHEN 325 MG PO TABS
650.0000 mg | ORAL_TABLET | Freq: Four times a day (QID) | ORAL | Status: DC | PRN
  Administered 2021-10-27 – 2021-10-30 (×5): 650 mg via ORAL
  Filled 2021-10-26 (×5): qty 2

## 2021-10-26 MED ORDER — MAGNESIUM OXIDE 400 MG PO TABS
400.0000 mg | ORAL_TABLET | Freq: Every day | ORAL | Status: DC
  Filled 2021-10-26: qty 1

## 2021-10-26 MED ORDER — ACETAMINOPHEN 650 MG RE SUPP: 650.0000 mg | Freq: Four times a day (QID) | RECTAL | Status: DC | PRN

## 2021-10-26 MED ORDER — ONDANSETRON HCL 4 MG PO TABS: 4.0000 mg | ORAL_TABLET | Freq: Four times a day (QID) | ORAL | Status: DC | PRN

## 2021-10-26 MED ORDER — ROPINIROLE HCL 0.5 MG PO TABS
0.5000 mg | ORAL_TABLET | Freq: Every day | ORAL | Status: DC
  Administered 2021-10-27 – 2021-10-29 (×4): 0.5 mg via ORAL
  Filled 2021-10-26 (×6): qty 1

## 2021-10-26 MED ORDER — APIXABAN 5 MG PO TABS
5.0000 mg | ORAL_TABLET | Freq: Two times a day (BID) | ORAL | Status: DC
  Administered 2021-10-26 – 2021-10-30 (×8): 5 mg via ORAL
  Filled 2021-10-26 (×8): qty 1

## 2021-10-26 MED ORDER — ESCITALOPRAM OXALATE 10 MG PO TABS
10.0000 mg | ORAL_TABLET | Freq: Every day | ORAL | Status: DC
  Administered 2021-10-27 – 2021-10-30 (×4): 10 mg via ORAL
  Filled 2021-10-26 (×4): qty 1

## 2021-10-26 MED ORDER — MIRABEGRON ER 50 MG PO TB24
50.0000 mg | ORAL_TABLET | Freq: Every day | ORAL | Status: DC
  Administered 2021-10-27 – 2021-10-30 (×4): 50 mg via ORAL
  Filled 2021-10-26 (×4): qty 1

## 2021-10-26 MED ORDER — LIDOCAINE-PRILOCAINE 2.5-2.5 % EX CREA: 1.0000 "application " | TOPICAL_CREAM | CUTANEOUS | Status: DC | PRN

## 2021-10-26 MED ORDER — GABAPENTIN 600 MG PO TABS
600.0000 mg | ORAL_TABLET | Freq: Three times a day (TID) | ORAL | Status: DC
  Administered 2021-10-26 – 2021-10-30 (×12): 600 mg via ORAL
  Filled 2021-10-26 (×15): qty 1

## 2021-10-26 MED ORDER — PRIMIDONE 50 MG PO TABS
50.0000 mg | ORAL_TABLET | Freq: Two times a day (BID) | ORAL | Status: DC
  Administered 2021-10-27 – 2021-10-30 (×8): 50 mg via ORAL
  Filled 2021-10-26 (×10): qty 1

## 2021-10-26 MED ORDER — AMIODARONE HCL 200 MG PO TABS
200.0000 mg | ORAL_TABLET | Freq: Every day | ORAL | Status: DC
  Administered 2021-10-27 – 2021-10-30 (×4): 200 mg via ORAL
  Filled 2021-10-26 (×4): qty 1

## 2021-10-26 NOTE — Assessment & Plan Note (Addendum)
-  On admission albumin was noted to be low at 2.9 and is now 2.2 -Wife notes that patient has had poor p.o. intake.  -Noted to have bilateral lower extremity edema which may be secondary to hypoalbuminemia. -Check prealbumin and was 13.7 -Glucerna and will need nutritionist evaluation

## 2021-10-26 NOTE — Assessment & Plan Note (Addendum)
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   -BNP was noted to be 214, but no significant JVD appreciated on physical exam.  -Strict intake and output; Patient is +338.3 Liters  -Daily weights -Furosemide currently on hold due to hypotension and may resume at D/C   -He is appearing more Euvolemic

## 2021-10-26 NOTE — Assessment & Plan Note (Addendum)
-  Patient appears to be currently rate controlled. -Home medications include amiodarone 200 mg daily. -Continue amiodarone and anticoagulation with Eliquis -Continue telemetry monitoring while hospitalized

## 2021-10-26 NOTE — Assessment & Plan Note (Addendum)
-  Patient has some mild expiratory wheezes intermittently on physical exam. -Breathing treatments as needed -If symptoms persist may warrant steroids -Flutter Valve, Incentive Spirometry, and Guaifenesin 1200 mg po BID -Start Xopenex/Atrovent  -SpO2: 96 % O2 Flow Rate (L/min): 3 L/min ; Weaned off of supplemental O2 via [place] and did not desaturate on Ambulatory Home O2 screen -Repeat CXR showed improvement on the Right -Continue to Monitor Respiratory Status carefully

## 2021-10-26 NOTE — Progress Notes (Signed)
Consult received to discontinue chemo infusion. 5FU pump disconnected. Ambulatory pump left in patient's chemo bag, inside his duffle bag to be returned to [facility]. Port site unremarkable with dressing and antimicrobial patch intact.

## 2021-10-26 NOTE — ED Triage Notes (Signed)
Per [facility] pt coming from home where family found patient on the floor. Last seen yesterday after chemo when pt complained of weakness. Patient does not remember falling and unsure of time. Patient incontinent of stool on himself and had vomit on him. Patient has skin tear to back of left upper arm. Was hypotensive with EMS 85/40 and given 150 CC NS then pressure went to 101/43. Patient alert to date, self and location.

## 2021-10-26 NOTE — Progress Notes (Signed)
Orthopedic Tech Progress Note Patient Details:  Justin Trujillo 1946-07-27 [id number] Level 2 Trauma Patient ID: [name]., male   DOB: May 10, 1946, 76 y.o.   MRN: [id number]  Chip Boer 10/26/2021, 9:53 AM

## 2021-10-26 NOTE — Assessment & Plan Note (Addendum)
-  Acute on chronic.  Review of records notes that patient's sodium levels have been slowly trending down currently 129 on admission.   -He had still been taking  furosemide 40 mg daily.  He has been given 500 mL bolus of IV fluids -Held furosemide -Orders placed to check urine sodium -Normal saline IV fluids at 75 mL/h now stopped so will replete with IV Sodium Phos -Recheck sodium levels showing improvement and now Na+ is 131 again

## 2021-10-26 NOTE — Progress Notes (Signed)
[facility] [phone]   [phone]  PROGRESS NOTE  Patient Care Team: Carol Ada, MD as PCP - General (Family Medicine) Evans Lance, MD as PCP - Cardiology (Cardiology) Orson Slick, MD as Consulting Physician (Hematology and Oncology)  Hematological/Oncological History # Metastatic Adenocarcinoma of the Distal Esophagus 01/27/2021: dermatology performed a punch biopsy of the right superior lateral anterior neck. Finding show adenocarcinoma negative for PSA and cytokeratin 20.  02/06/2021: establish care with Dr. Lorenso Courier  02/15/2021: EGD found esophageal mass. Biopsy confirms poorly differentiated carcinoma 02/22/2021: PET CT scan shows FDG avid mass in distal esophagus and subcutaneous tissue or right neck.  03/22/2021-04/05/2021: palliative radiation to the esophageal mass.  04/26/2021: Cycle 1 Day 1 of FOLFOX + Nivolumab 05/10/2021: Cycle 2 Day 1 of FOLFOX + Nivolumab 05/24/2021: Cycle 3 Day 1 of FOLFOX + Nivolumab 06/07/2021: Cycle 4 Day 1 of FOLFOX + Nivolumab 06/23/2021: Cycle 5 Day 1 of FOLFOX + Nivolumab 07/05/2021: Cycle 6 Day 1 of FOLFOX + Nivolumab 08/15/2021: Cycle 7 Day 1 of FOLFOX + Nivolumab (delayed due to hospitalization for N/V and dehydration) 08/29/2021: Cycle 8 Day 1 of FOLFOX + Nivolumab  09/12/2021: Cycle 9 Day 1 of FOLFOX + Nivolumab  09/26/2021: cycle delayed due to COVID infection.  10/11/2021: Cycle 10 Day 1 of FOLFOX + Nivolumab  10/20/2021: CT C/A/P showed residual distal esophageal wall thickening. No definitive evidence metastatic disease 10/25/2021: Cycle 11 Day 1 of FOLFOX + Nivolumab   Interval History:  Justin Trujillo. 76 y.o. male with medical history significant for metastatic esophageal adenocarcinoma who presents for a follow up visit. The patient's last visit was on 10/11/2021. In the interim, Justin Trujillo was completed cycle 10 of treatment.  Interim since his last visit he had a CT scan which showed stable disease  in the esophagus, though new compression fractures were noted on T11 and T12.  On exam today Justin Trujillo reports back pain continues to be his primary issue.  He notes while sitting at rest his pain is about a 5 out of 10.  He notes that in most severe can reach about 8 out of 10 in severity.  He reports that he feels "best" when he is "horizontal" in bed with 0 out of 10 pain.  He reports that other than pain he has been doing quite well.  He denies any nausea, vomiting, or diarrhea.  He does continue to have some cold sensitivity as result of his chemotherapy and unfortunately it lasts the full 2 weeks in between treatments.  He notes that he is having some more swelling in his lower extremities but otherwise he has been well.  He notes that right now everything has been "manageable".  He denies fevers, chills, night sweats, chest pain, cough, mucositis or rash. He has no other complaints. Rest of the 10 point ROS is below.  MEDICAL HISTORY:  Past Medical History:  Diagnosis Date   Arthritis    Cancer ([facility])    Coronary atherosclerosis of native coronary artery    Depression    Esophageal cancer ([facility])    Essential tremor    High cholesterol    Hypertension    Hypertension    Hypogonadism male    Obstructive chronic bronchitis with exacerbation ([facility])    Obstructive sleep apnea (adult) (pediatric)    no cpap   Other and unspecified hyperlipidemia    Other emphysema ([facility])    Proteinuria 2019   has seen a  nephrologist   Shortness of breath    with excertion   Situational stress    Type II or unspecified type diabetes mellitus without mention of complication, not stated as uncontrolled    type 2   Unspecified essential hypertension     SURGICAL HISTORY: Past Surgical History:  Procedure Laterality Date   CARDIAC CATHETERIZATION     IR IMAGING GUIDED PORT INSERTION  04/24/2021   IR KYPHO LUMBAR INC FX REDUCE BONE BX UNI/BIL CANNULATION INC/IMAGING  06/13/2018   L lens implant     R knee  replacement x2     REVERSE SHOULDER ARTHROPLASTY Left 03/26/2019   Procedure: REVERSE SHOULDER ARTHROPLASTY;  Surgeon: Justice Britain, MD;  Location: [facility];  Service: Orthopedics;  Laterality: Left;  16mn   TONSILLECTOMY     TONSILLECTOMY AND ADENOIDECTOMY     vascectomy      SOCIAL HISTORY: Social History   Socioeconomic History   Marital status: Married    Spouse name: Not on file   Number of children: Not on file   Years of education: Not on file   Highest education level: Not on file  Occupational History   Occupation: lLandscape architect Tobacco Use   Smoking status: Every Day    Packs/day: 1.00    Years: 55.00    Pack years: 55.00    Types: Cigarettes   Smokeless tobacco: Never  Vaping Use   Vaping Use: Never used  Substance and Sexual Activity   Alcohol use: Yes    Alcohol/week: 1.0 standard drink    Types: 1 Cans of beer per week    Comment: occasional beer   Drug use: Never   Sexual activity: Not Currently  Other Topics Concern   Not on file  Social History Narrative   ** Merged History Encounter **       Social Determinants of Health   Financial Resource Strain: Not on file  Food Insecurity: Not on file  Transportation Needs: Not on file  Physical Activity: Not on file  Stress: Not on file  Social Connections: Not on file  Intimate Partner Violence: Not on file    FAMILY HISTORY: Family History  Problem Relation Age of Onset   Emphysema Father    Heart disease Father    Clotting disorder Mother    Kidney cancer Child     ALLERGIES:  is allergic to iodinated contrast media, moxifloxacin, penicillins, shellfish-derived products, amoxicillin, chlorhexidine, other, and latex.  MEDICATIONS:  No current facility-administered medications for this visit.   Current Outpatient Medications  Medication Sig Dispense Refill   acetaminophen (TYLENOL) 500 MG tablet Take 650 mg by mouth every 8 (eight) hours as needed for mild pain (pain).      albuterol (VENTOLIN HFA) 108 (90 Base) MCG/ACT inhaler Inhale 1 puff into the lungs every 6 (six) hours as needed for wheezing.     ALPRAZolam (XANAX) 0.5 MG tablet Take 0.5 mg by mouth 2 (two) times daily as needed for anxiety.     amiodarone (PACERONE) 200 MG tablet Take 2 tablets (400 mg total) by mouth 2 (two) times daily for 7 days, THEN 1 tablet (200 mg total) daily. 90 tablet 3   apixaban (ELIQUIS) 5 MG TABS tablet Take 1 tablet (5 mg total) by mouth 2 (two) times daily. 60 tablet 2   atorvastatin (LIPITOR) 40 MG tablet Take 1 tablet (40 mg total) by mouth daily. 30 tablet 0   Calcium Carb-Cholecalciferol (CALCIUM 600+D3 PO) Take 1  tablet by mouth daily.     Cholecalciferol (VITAMIN D) 50 MCG (2000 UT) tablet Take 2,000 Units by mouth daily.     doxycycline (VIBRA-TABS) 100 MG tablet Take by mouth.     escitalopram (LEXAPRO) 10 MG tablet Take 10 mg by mouth daily.     furosemide (LASIX) 40 MG tablet Take 1 tablet (40 mg total) by mouth daily. (Patient not taking: Reported on 09/12/2021) 30 tablet 1   gabapentin (NEURONTIN) 600 MG tablet Take 600 mg by mouth 3 (three) times daily.     HYDROmorphone (DILAUDID) 8 MG tablet 1 tablet as needed     lidocaine-prilocaine (EMLA) cream Apply 1 application topically as needed. 30 g 0   magnesium oxide (MAG-OX) 400 MG tablet Take 1 tablet (400 mg total) by mouth daily. 14 tablet 0   Multiple Vitamins-Minerals (CENTRUM SILVER) tablet daily in the afternoon.     MYRBETRIQ 50 MG TB24 tablet Take 50 mg by mouth daily.     ondansetron (ZOFRAN ODT) 4 MG disintegrating tablet Take 1 tablet (4 mg total) by mouth every 8 (eight) hours as needed for nausea or vomiting. 20 tablet 0   pantoprazole (PROTONIX) 40 MG tablet Take 1 tablet (40 mg total) by mouth 2 (two) times daily. 60 tablet 2   potassium chloride SA (KLOR-CON) 20 MEQ tablet Take 2 tablets (40 mEq total) by mouth daily. 60 tablet 1   predniSONE (DELTASONE) 50 MG tablet Take 1 tablet 13 hours, 7  hours and 1 hour prior to CT Scan 3 tablet 0   primidone (MYSOLINE) 50 MG tablet Take 1 tablet (50 mg total) by mouth in the morning and at bedtime. 180 tablet 2   prochlorperazine (COMPAZINE) 10 MG tablet Take 1 tablet (10 mg total) by mouth every 6 (six) hours as needed for nausea or vomiting. 30 tablet 0   rOPINIRole (REQUIP) 0.5 MG tablet Take 0.5 mg by mouth at bedtime.     Semaglutide,0.25 or 0.5MG/DOS, (OZEMPIC, 0.25 OR 0.5 MG/DOSE,) 2 MG/1.5ML SOPN Ozempic 0.25 mg or 0.5 mg (2 mg/1.5 mL) subcutaneous pen injector  INJECT 0.5 MG SUBCUTANEOUSLY ONCE WEEKLY     sucralfate (CARAFATE) 1 GM/10ML suspension Take 10 mLs (1 g total) by mouth 4 (four) times daily -  with meals and at bedtime. (Patient not taking: Reported on 09/12/2021) 420 mL 2   tamsulosin (FLOMAX) 0.4 MG CAPS capsule Take 0.4 mg by mouth 2 (two) times daily.     TESTOSTERONE IM Inject 1 mL into the muscle every 14 (fourteen) days.     tiZANidine (ZANAFLEX) 4 MG tablet Take 4 mg by mouth 3 (three) times daily as needed.     trolamine salicylate (ASPERCREME) 10 % cream Apply 1 application topically 2 (two) times daily as needed for muscle pain.      Facility-Administered Medications Ordered in Other Visits  Medication Dose Route Frequency Provider Last Rate Last Admin   sodium chloride flush (NS) 0.9 % injection 10-40 mL  10-40 mL Intracatheter Q12H Tegeler, Gwenyth Allegra, MD       sodium chloride flush (NS) 0.9 % injection 10-40 mL  10-40 mL Intracatheter PRN Tegeler, Gwenyth Allegra, MD        REVIEW OF SYSTEMS:   Constitutional: ( - ) fevers, ( - )  chills , ( - ) night sweats Eyes: ( - ) blurriness of vision, ( - ) double vision, ( - ) watery eyes Ears, nose, mouth, throat, and face: ( - ) mucositis, ( - )  sore throat Respiratory: ( - ) cough, ( - ) dyspnea, ( - ) wheezes Cardiovascular: ( - ) palpitation, ( - ) chest discomfort, ( + ) lower extremity swelling Gastrointestinal:  ( - ) nausea, ( - ) heartburn, ( - ) change in  bowel habits Skin: ( - ) abnormal skin rashes Lymphatics: ( - ) new lymphadenopathy, ( - ) easy bruising Neurological: ( - ) numbness, ( - ) tingling, ( - ) new weaknesses Behavioral/Psych: ( - ) mood change, ( - ) new changes  All other systems were reviewed with the patient and are negative.  PHYSICAL EXAMINATION: ECOG PERFORMANCE STATUS: 1 - Symptomatic but completely ambulatory  Vitals:   10/25/21 1045  BP: (!) 152/82  Pulse: 81  Resp: 17  Temp: 97.6 F (36.4 C)  SpO2: 100%   Filed Weights   10/25/21 1045  Weight: 158 lb 6.4 oz (71.8 kg)    GENERAL: Well-appearing elderly Caucasian male, alert, no distress and comfortable SKIN: skin color, texture, turgor are normal, no rashes or significant lesions EYES: conjunctiva are pink and non-injected, sclera clear LUNGS: clear to auscultation and percussion with normal breathing effort HEART: regular rate & rhythm and no murmurs. Mild lower extremity edema, improved.  PSYCH: alert & oriented x 3, fluent speech NEURO: no focal motor/sensory deficits  LABORATORY DATA:  I have reviewed the data as listed CBC Latest Ref Rng & Units 10/26/2021 10/26/2021 10/25/2021  WBC 4.0 - 10.5 K/uL - 4.1 5.0  Hemoglobin 13.0 - 17.0 g/dL 13.6 12.9(L) 10.5(L)  Hematocrit 39.0 - 52.0 % 40.0 39.7 31.7(L)  Platelets 150 - 400 K/uL - 114(L) 133(L)    CMP Latest Ref Rng & Units 10/26/2021 10/26/2021 10/25/2021  Glucose 70 - 99 mg/dL 174(H) 178(H) 81  BUN 8 - 23 mg/dL _0 Creatinine 0.61 - 1.24 mg/dL 0.80 1.06 0.76  Sodium 135 - 145 mmol/L 127(L) 129(L) 130(L)  Potassium 3.5 - 5.1 mmol/L 4.1 4.2 4.1  Chloride 98 - 111 mmol/L 96(L) 96(L) 97(L)  CO2 22 - 32 mmol/L - 21(L) 28  Calcium 8.9 - 10.3 mg/dL - 8.5(L) 8.9  Total Protein 6.5 - 8.1 g/dL - 5.6(L) 6.2(L)  Total Bilirubin 0.3 - 1.2 mg/dL - 0.8 0.5  Alkaline Phos 38 - 126 U/L - 73 86  AST 15 - 41 U/L - 43(H) 20  ALT 0 - 44 U/L - 25 16    RADIOGRAPHIC STUDIES: I have personally reviewed the  radiological images as listed and agreed with the findings in the report: FDG avid distal esophagus as well as subcutaneous none lymph node region in the right neck. CT Abdomen Pelvis Wo Contrast  Result Date: 10/23/2021 CLINICAL DATA:  Esophageal cancer, assess treatment response, chemotherapy in progress, radiation therapy complete. Shortness of breath on exertion and cough. EXAM: CT CHEST, ABDOMEN AND PELVIS WITHOUT CONTRAST TECHNIQUE: Multidetector CT imaging of the chest, abdomen and pelvis was performed following the standard protocol without IV contrast. RADIATION DOSE REDUCTION: This exam was performed according to the departmental dose-optimization program which includes automated exposure control, adjustment of the mA and/or kV according to patient size and/or use of iterative reconstruction technique. COMPARISON:  07/13/2021. FINDINGS: CT CHEST FINDINGS Cardiovascular: Right IJ Port-A-Cath terminates in the low SVC. Atherosclerotic calcification of the aorta, aortic valve and coronary arteries. Ascending aorta measures up to 4.3 cm. Heart is at the upper limits of normal in size to mildly enlarged. Dense mitral annulus calcification. No pericardial effusion. Mediastinum/Nodes: No  pathologically enlarged mediastinal, periesophageal or axillary lymph nodes. Hilar regions are difficult to definitively evaluate without IV contrast but appear grossly unremarkable. Slight distal esophageal wall thickening. Lungs/Pleura: Subpleural reticular densities and ground-glass may be minimally progressive. Associated traction bronchiolectasis. 4 mm peripheral left lower lobe nodule (6/117), new. No pleural fluid. Minimal debris in the airway. Musculoskeletal: Degenerative changes in the spine. Bilateral shoulder arthroplasties. T11 and T12 compression fractures are new from 07/13/2021. No worrisome lytic or sclerotic lesions. CT ABDOMEN PELVIS FINDINGS Hepatobiliary: Liver and gallbladder are unremarkable. No biliary  ductal dilatation. Pancreas: Negative. Spleen: Negative. Adrenals/Urinary Tract: Right adrenal gland is unremarkable. There may be slight nodular thickening of the left adrenal gland. Kidneys are unremarkable. Ureters are decompressed. Ventral bladder wall thickening. Stomach/Bowel: Stomach, small bowel and appendix are unremarkable. A fair amount of stool in colon is indicative of constipation. Vascular/Lymphatic: Atherosclerotic calcification of the aorta. No pathologically enlarged lymph nodes. Reproductive: Prostate is mildly prominent. Other: No free fluid. Mesenteries and peritoneum are otherwise unremarkable. Musculoskeletal: Degenerative and postoperative changes in the spine. Dextroconvex scoliosis of the lumbar spine. IMPRESSION: 1. Residual distal esophageal wall thickening. No definitive evidence metastatic disease. 2. 4 mm peripheral left lower lobe nodule may be new. Recommend attention on follow-up. 3. Subpleural reticular densities and ground-glass may be minimally progressive. Associated traction bronchiolectasis. Findings may reflect nonspecific interstitial pneumonitis or usual interstitial pneumonitis and could be treatment related. 4. Ascending aortic aneurysm. Recommend annual imaging followup by CTA or MRA. This recommendation follows 2010 ACCF/AHA/AATS/ACR/ASA/SCA/SCAI/SIR/STS/SVM Guidelines for the Diagnosis and Management of Patients with Thoracic Aortic Disease. Circulation. 2010; 121: W580-D983. Aortic aneurysm NOS (ICD10-I71.9). 5. Mild prostate prominence with ventral bladder wall thickening, indicative of an element outlet obstruction. 6. New T11 and T12 compression fractures. 7. Aortic atherosclerosis (ICD10-I70.0). Coronary artery calcification. Electronically Signed   By: Lorin Picket M.D.   On: 10/23/2021 13:10   CT HEAD WO CONTRAST  Result Date: 10/26/2021 CLINICAL DATA:  Moderate to severe head trauma EXAM: CT HEAD WITHOUT CONTRAST CT CERVICAL SPINE WITHOUT CONTRAST  TECHNIQUE: Multidetector CT imaging of the head and cervical spine was performed following the standard protocol without intravenous contrast. Multiplanar CT image reconstructions of the cervical spine were also generated. RADIATION DOSE REDUCTION: This exam was performed according to the departmental dose-optimization program which includes automated exposure control, adjustment of the mA and/or kV according to patient size and/or use of iterative reconstruction technique. COMPARISON:  None. FINDINGS: CT HEAD FINDINGS Brain: No evidence of acute infarction, hemorrhage, hydrocephalus, extra-axial collection or mass lesion/mass effect. 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 Cerebral volume loss, central predominant. Small, incidental calcification in the inter hemispheric fissure. Vascular: No hyperdense vessel or unexpected calcification. Skull: No acute fracture. Sinuses/Orbits: Bilateral cataract resection. Fluid levels that are low-density in the left maxillary and sphenoid sinuses. Left mastoid and middle ear opacification that is subtotal. CT CERVICAL SPINE FINDINGS Alignment: Reversal of cervical lordosis. Skull base and vertebrae: Streak artifact intermittent motion affects visualization, streak artifact heavily affecting the C1 and C2 levels. No convincing fracture. Generalized osteopenia. Soft tissues and spinal canal: No prevertebral fluid or swelling. No visible canal hematoma. Disc levels: Generalized degenerative disc narrowing and ridging. Multilevel facet spurring. Upper chest: Negative IMPRESSION: 1. No evidence of acute intracranial injury. Negative for cervical spine fracture. 2. Left-sided sinusitis with fluid levels. Left mastoid and middle ear opacification. 3. 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 Electronically Signed   By: Gilford Silvius.D.  On: 10/26/2021 10:26   CT CHEST WO CONTRAST  Result Date: 10/23/2021 CLINICAL DATA:  Esophageal  cancer, assess treatment response, chemotherapy in progress, radiation therapy complete. Shortness of breath on exertion and cough. EXAM: CT CHEST, ABDOMEN AND PELVIS WITHOUT CONTRAST TECHNIQUE: Multidetector CT imaging of the chest, abdomen and pelvis was performed following the standard protocol without IV contrast. RADIATION DOSE REDUCTION: This exam was performed according to the departmental dose-optimization program which includes automated exposure control, adjustment of the mA and/or kV according to patient size and/or use of iterative reconstruction technique. COMPARISON:  07/13/2021. FINDINGS: CT CHEST FINDINGS Cardiovascular: Right IJ Port-A-Cath terminates in the low SVC. Atherosclerotic calcification of the aorta, aortic valve and coronary arteries. Ascending aorta measures up to 4.3 cm. Heart is at the upper limits of normal in size to mildly enlarged. Dense mitral annulus calcification. No pericardial effusion. Mediastinum/Nodes: No pathologically enlarged mediastinal, periesophageal or axillary lymph nodes. Hilar regions are difficult to definitively evaluate without IV contrast but appear grossly unremarkable. Slight distal esophageal wall thickening. Lungs/Pleura: Subpleural reticular densities and ground-glass may be minimally progressive. Associated traction bronchiolectasis. 4 mm peripheral left lower lobe nodule (6/117), new. No pleural fluid. Minimal debris in the airway. Musculoskeletal: Degenerative changes in the spine. Bilateral shoulder arthroplasties. T11 and T12 compression fractures are new from 07/13/2021. No worrisome lytic or sclerotic lesions. CT ABDOMEN PELVIS FINDINGS Hepatobiliary: Liver and gallbladder are unremarkable. No biliary ductal dilatation. Pancreas: Negative. Spleen: Negative. Adrenals/Urinary Tract: Right adrenal gland is unremarkable. There may be slight nodular thickening of the left adrenal gland. Kidneys are unremarkable. Ureters are decompressed. Ventral bladder  wall thickening. Stomach/Bowel: Stomach, small bowel and appendix are unremarkable. A fair amount of stool in colon is indicative of constipation. Vascular/Lymphatic: Atherosclerotic calcification of the aorta. No pathologically enlarged lymph nodes. Reproductive: Prostate is mildly prominent. Other: No free fluid. Mesenteries and peritoneum are otherwise unremarkable. Musculoskeletal: Degenerative and postoperative changes in the spine. Dextroconvex scoliosis of the lumbar spine. IMPRESSION: 1. Residual distal esophageal wall thickening. No definitive evidence metastatic disease. 2. 4 mm peripheral left lower lobe nodule may be new. Recommend attention on follow-up. 3. Subpleural reticular densities and ground-glass may be minimally progressive. Associated traction bronchiolectasis. Findings may reflect nonspecific interstitial pneumonitis or usual interstitial pneumonitis and could be treatment related. 4. Ascending aortic aneurysm. Recommend annual imaging followup by CTA or MRA. This recommendation follows 2010 ACCF/AHA/AATS/ACR/ASA/SCA/SCAI/SIR/STS/SVM Guidelines for the Diagnosis and Management of Patients with Thoracic Aortic Disease. Circulation. 2010; 121: O962-X528. Aortic aneurysm NOS (ICD10-I71.9). 5. Mild prostate prominence with ventral bladder wall thickening, indicative of an element outlet obstruction. 6. New T11 and T12 compression fractures. 7. Aortic atherosclerosis (ICD10-I70.0). Coronary artery calcification. Electronically Signed   By: Lorin Picket M.D.   On: 10/23/2021 13:10   CT CERVICAL SPINE WO CONTRAST  Result Date: 10/26/2021 CLINICAL DATA:  Moderate to severe head trauma EXAM: CT HEAD WITHOUT CONTRAST CT CERVICAL SPINE WITHOUT CONTRAST TECHNIQUE: Multidetector CT imaging of the head and cervical spine was performed following the standard protocol without intravenous contrast. Multiplanar CT image reconstructions of the cervical spine were also generated. RADIATION DOSE REDUCTION:  This exam was performed according to the departmental dose-optimization program which includes automated exposure control, adjustment of the mA and/or kV according to patient size and/or use of iterative reconstruction technique. COMPARISON:  None. FINDINGS: CT HEAD FINDINGS Brain: No evidence of acute infarction, hemorrhage, hydrocephalus, extra-axial collection or mass lesion/mass effect. 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 Cerebral volume loss,  central predominant. Small, incidental calcification in the inter hemispheric fissure. Vascular: No hyperdense vessel or unexpected calcification. Skull: No acute fracture. Sinuses/Orbits: Bilateral cataract resection. Fluid levels that are low-density in the left maxillary and sphenoid sinuses. Left mastoid and middle ear opacification that is subtotal. CT CERVICAL SPINE FINDINGS Alignment: Reversal of cervical lordosis. Skull base and vertebrae: Streak artifact intermittent motion affects visualization, streak artifact heavily affecting the C1 and C2 levels. No convincing fracture. Generalized osteopenia. Soft tissues and spinal canal: No prevertebral fluid or swelling. No visible canal hematoma. Disc levels: Generalized degenerative disc narrowing and ridging. Multilevel facet spurring. Upper chest: Negative IMPRESSION: 1. No evidence of acute intracranial injury. Negative for cervical spine fracture. 2. Left-sided sinusitis with fluid levels. Left mastoid and middle ear opacification. 3. 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 Electronically Signed   By: Jorje Guild M.D.   On: 10/26/2021 10:26   DG Pelvis Portable  Result Date: 10/26/2021 CLINICAL DATA:  Found down.  Weakness EXAM: PORTABLE PELVIS 1-2 VIEWS COMPARISON:  10/20/2021 FINDINGS: Bones appear demineralized. There is no evidence of pelvic fracture or diastasis. No pelvic bone lesions are seen. Large volume of stool projects throughout the visualized  colon. Atherosclerotic vascular calcifications are present. IMPRESSION: Negative. Electronically Signed   By: Davina Poke D.O.   On: 10/26/2021 09:33   DG Chest Port 1 View  Result Date: 10/26/2021 CLINICAL DATA:  Family found the patient on the floor, last seen yesterday after chemotherapy when patient complained of weakness. Patient does not remember falling. EXAM: PORTABLE CHEST 1 VIEW COMPARISON:  CT examination dated October 20, 2021 FINDINGS: The heart is enlarged. There are bilateral lower lobe opacities, left worse than the right concerning for pneumonia including aspiration pneumonia. Bilateral shoulder arthroplasty. Right IJ access MediPort with distal tip in the SVC. IMPRESSION: 1. Stable cardiomegaly. 2. Bilateral lower lobe opacities, left worse than the right concerning for pneumonia including aspiration pneumonia. Follow-up examination to resolution is recommended. Electronically Signed   By: Keane Police D.O.   On: 10/26/2021 09:35   CT CHEST ABDOMEN PELVIS WO CONTRAST  Result Date: 10/26/2021 CLINICAL DATA:  76 year old male presents for evaluation following fall. Patient also with history of esophageal neoplasm. EXAM: CT CHEST, ABDOMEN AND PELVIS WITHOUT CONTRAST TECHNIQUE: Multidetector CT imaging of the chest, abdomen and pelvis was performed following the standard protocol without IV contrast. RADIATION DOSE REDUCTION: This exam was performed according to the departmental dose-optimization program which includes automated exposure control, adjustment of the mA and/or kV according to patient size and/or use of iterative reconstruction technique. COMPARISON:  October 20, 2021. FINDINGS: CT CHEST FINDINGS Cardiovascular: The aorta shows smooth contours without adjacent stranding. No mediastinal hematoma. Aortic dilation to approximately 4 cm greatest axial dimension is unchanged. Generalized aortic atherosclerosis. Signs of mitral annular calcification. Three-vessel coronary artery  disease. No substantial pericardial effusion, stable small pericardial effusion when compared to previous imaging from just 6 days ago RIGHT-sided Port-A-Cath terminates at the caval to atrial junction. Mediastinum/Nodes: Patulous esophagus similar to the prior exam. Mild thickening of the distal esophagus also unchanged in this patient with known history of esophageal neoplasm. No mediastinal hematoma or adenopathy. No axillary or thoracic inlet adenopathy. Lungs/Pleura: No pneumothorax. Reticular opacities and nodular changes along the pleural surface with increasing conspicuity since previous imaging. Pleural based airspace disease with nodular features at the lung bases and along the anterior chest, these findings have developed since the previous exam of just 6 days ago. No  pneumothorax. Material and the trachea and material layering in the mainstem bronchi extending into peripheral bronchi with signs of bronchial wall thickening. Musculoskeletal: See below for full musculoskeletal details. CT ABDOMEN PELVIS FINDINGS Hepatobiliary: Liver with smooth contours. Trace fluid adjacent to medial RIGHT hemi liver similar to previous imaging. No gross signs of hepatic trauma. No Peri hepatic fluid. No pericholecystic stranding. Pancreas: Pancreatic atrophy without signs of adjacent inflammation. Spleen: Trace stranding adjacent to the spleen. Minimal fluid adjacent to the spleen is similar to the study of October 20, 2021. No gross splenic abnormality. Adrenals/Urinary Tract: Mild bilateral adrenal thickening. Perinephric stranding bilaterally perhaps slightly increased compared to previous imaging. Generalized increase in fascial thickening and edema albeit mild since the previous study. No hydronephrosis.  No perinephric hematoma. Stomach/Bowel: Small hiatal hernia and distal esophageal thickening as described. No signs of bowel dilation to suggest obstruction of the small bowel. The appendix is normal. Colon is  stool filled without signs of adjacent stranding. No signs of pneumatosis. No pneumoperitoneum. No pelvic fluid. Vascular/Lymphatic: Aortic atherosclerosis. No sign of aneurysm. Smooth contour of the IVC. There is no gastrohepatic or hepatoduodenal ligament lymphadenopathy. No retroperitoneal or mesenteric lymphadenopathy. No pelvic sidewall lymphadenopathy. Atherosclerosis is moderate to marked. Vascular structures not well assessed given the lack of intravenous contrast. No focal stranding or hematoma adjacent to the abdominal aorta or pelvic vessels. Reproductive: Unremarkable by CT. Urinary bladder is moderately distended extending to the sacral promontory. Other: No fluid in the pelvis. Trace fluid about the liver and spleen similar to recent comparison imaging. Musculoskeletal: No displaced rib fractures. Mildly limited assessment due to respiratory motion. Bony pelvis without signs of fracture. Extensive spinal degenerative changes and osteopenia. Post bilateral shoulder arthroplasty. Visualized clavicles and scapulae are intact. Signs of prior L3-L4 spinal fusion and cement augmentation at L4 showing no change. Spinal compression fractures with similar appearance at the T11-T12 level based on recent comparison imaging with approximally 30% loss of height at these levels. Spinal alignment is unchanged. Sternum grossly intact with some artifact crossing the anterior sternum due to motion. IMPRESSION: 1. No evidence of acute traumatic injury to the chest, abdomen or pelvis. 2. Peripheral nodular parenchymal consolidative changes with marked worsening since the recent comparison imaging potentially post/drug treatment related given pattern seen and rapid interval progression. Correlate with any respiratory symptoms. Findings are felt to have an overlay of acute inflammatory changes related to aspiration given findings in the bronchi. However, subpleural changes extend along the anterior chest which would be  atypical for purely aspiration related changes. 3. Small hiatal hernia and distal esophageal thickening also unchanged in this patient with known history of esophageal neoplasm. 4. Mild generalized edema with perinephric stranding and fascial edema in the abdomen not substantially changed compared to recent imaging. 5. Unchanged appearance of T11-T12 compression fractures. 6. Aortic atherosclerosis. Three-vessel coronary artery disease. 7. Ascending thoracic aortic aneurysm. Recommend annual imaging followup by CTA or MRA. This recommendation follows 2010 ACCF/AHA/AATS/ACR/ASA/SCA/SCAI/SIR/STS/SVM Guidelines for the Diagnosis and Management of Patients with Thoracic Aortic Disease. Circulation. 2010; 121: W102-V253. Aortic aneurysm NOS (ICD10-I71.9) Aortic Atherosclerosis (ICD10-I70.0). Electronically Signed   By: Zetta Bills M.D.   On: 10/26/2021 10:31    ASSESSMENT & PLAN Justin Trujillo 76 y.o. male with medical history significant for metastatic esophageal adenocarcinoma who presents for a follow up visit. The patient is negative for HER2 but has a high TPS score.  Therefore he is an excellent candidate for FOLFOX with nivolumab.    The regimen  of FOLFOX with nivolumab will be administered q 2 weeks with clinic visits prior to each infusion. This will be continued until progression or intolerance.   #Metastatic Adenocarcinoma of the Esophagus #Dysphagia -- Underwent palliative radiation in order to help alleviate his dysphagia. Patient now tolerating PO well.  --Unfortunately given the spread of this cancer to the skin we will need to treat this as metastatic disease with systemic therapy  -- Final testing on the tissue resulted with HER2 negative status, but TPS score of 60%.  Therefore he would be a good candidate for FOLFOX with nivolumab --Started Cycle 1 Day 1 of FOLFOX plus Nivolumab on 04/26/2021 with good tolerance.  --Restaging CT Scan form 10/20/2021 shows stable soft tissue thickening of  the esophagus. No evidence of progression.  Plan:  --Labs today reviewed and adequate for treatment. WBC 10.7, Hgb stable at 12.1, Plt 223 K. TSH levels are pending. CMP reviewed and requires no intervention. --Recommend to proceed with Cycle 11 Day 1 today.  --next CT scan in May 2023 --RTC in 2 weeks for Cycle 12   #Supportive Care -- chemotherapy education complete -- port placed -- zofran 39m q8H PRN and compazine 116mPO q6H for nausea -- EMLA cream for port  No orders of the defined types were placed in this encounter.  All questions were answered. The patient knows to call the clinic with any problems, questions or concerns.  I have spent a total of 30 minutes minutes of face-to-face and non-face-to-face time, preparing to see the patient, performing a medically appropriate examination, counseling and educating the patient, documenting clinical information in the electronic health record, and care coordination.   JoLedell PeoplesMD Department of Hematology/Oncology [place] WeMedstar Montgomery Medical Centerhone: [phone]: [phone]: [email or web address]  10/26/2021 10:47 AM

## 2021-10-26 NOTE — Assessment & Plan Note (Addendum)
-  High-sensitivity troponin 37->47. -Check EKG and likely this is demand ischemia -Follow up in the outpatient setting

## 2021-10-26 NOTE — ED Notes (Signed)
Pt has small abrasion on chin.

## 2021-10-26 NOTE — Telephone Encounter (Signed)
Received call from pt's wife. She states that Justin Trujillo was taken to [facility]  ED via EMS this am due to 2 falls. One fall was last night and one was this morning. He has has had some mental status changes.  She states that he was due to come in tomorrow for his pump d/c. Advised that I would let Dr. Lorenso Courier know about the situation. Discussed the above with Dr. Lorenso Courier. He states the pump can be d/'d today in the ED as pt is going to be admitted. Call made to [facility] ED and spoke with pt's nurse, Larene Beach. Advised that the pump can be d/c'd and that the pump needs to be returned to the cancer center @ [place] states she will notify IV team and charge nurse regarding the above.

## 2021-10-26 NOTE — ED Notes (Signed)
Drucie Ip called from [facility]. Beth informed RN that pt chemo medication can be discontinued and pump will need to be sent via courier back to [facility].

## 2021-10-26 NOTE — ED Notes (Addendum)
RN notified RT to assess pt breathing. Pt is using his stomach to breathe with pursed lips. RT at the bedside.Pt appear flushed around neck and chest. Extremities are pale in color.

## 2021-10-26 NOTE — Progress Notes (Signed)
Responded to ED page to support patient that experienced fall this morning.  Was found down by wife on floor. Pt. Complaining of lots of pain in multiple areas of body.  Per staff and wife patient has cancer.  Provided support to both patient and wife.  Pt. Going to CT. Chaplain available as needed.  Jaclynn Major, [place], [facility], Pager [phone]

## 2021-10-26 NOTE — ED Notes (Signed)
Upon entering room RN noticed pt O2 85% room air. RN placed pt on 2L O2 notified RT

## 2021-10-26 NOTE — Assessment & Plan Note (Addendum)
-  Patient is currently undergoing chemotherapy last infusion 2/8 and followed by Dr. Lorenso Courier of oncology. -Dr. Lorenso Courier added to the treatment team via epic and after discussion with Dr. Lindi Adie yesterday will add Granix daily and received a dose yesterday and will receive a dose today -Continue Dilaudid as needed for pain

## 2021-10-26 NOTE — ED Notes (Signed)
RN wrapped pt skin tear and applied bacitracin ointment. Provided pt with warm blankets and slip resistant socks.

## 2021-10-26 NOTE — ED Notes (Signed)
RN notified EDP about RT assessing pt.

## 2021-10-26 NOTE — Assessment & Plan Note (Addendum)
-  Patient had been treated for COVID-19 infection in January with paxlovid.

## 2021-10-26 NOTE — Assessment & Plan Note (Addendum)
-  Patient presents with complaints of weakness after having several falls here recently.  Suspect symptoms are likely secondary to dehydration in the setting of poor p.o. intake on chemotherapy and now diarrhea.  Patient has been given IV fluids due to hypotension. -Admit medical telemetry bed -Continue to Hold diuretics -Check TSH was 0.935 -PT/OT to eval and treat and recommending home health for now but may progress to SNF if he does not improve -Transitions of care consulted and will D/C with Home Health as he is improved

## 2021-10-26 NOTE — ED Notes (Signed)
Provided pt with some lip balm per request from spouse

## 2021-10-26 NOTE — H&P (Addendum)
History and Physical    Patient: Justin Trujillo. EXH:[id number] DOB: Jun 30, 1946 DOA: 10/26/2021 DOS: the patient was seen and examined on 10/26/2021 PCP: Carol Ada, MD  Patient coming from: Home via EMS  Chief Complaint:  Chief Complaint  Patient presents with   Fall   Weakness    HPI: Justin Trujillo. is a 76 y.o. male with medical history significant of esophageal adenocarcinoma currently receiving chemotherapy, hypertension, hyperlipidemia, PAF on Eliquis, DM type II, OSA, and GERD who presents with complaints of weakness after having having falls at home.  His wife is present at bedside and provides additional history.  Apparently, the patient had fallen twice last week on separate days, but then had fallen yesterday evening following his chemotherapy infusion.  At that time his wife was able to help him get back into bed, but reports that he fell again early this morning for which she called EMS.  She reports that he had COVID back in January of this year and was treated with Plaxovid.  However, since that time he has had a persistent cough.  The cough is productive, but sometimes he has a hard time coughing anything up.  He has been treated with doxycycline thereafter for the possibility of bronchitis.  Overall his appetite has been poor and has not been willing to eat much.  Patient complains of having pain all over.  Approximately 3-4 weeks ago he had been switched from oxycodone to Dilaudid for pain control.  To her knowledge she has not had any recent fevers, but is not normally on oxygen at baseline.  His wife also notes that he normally has high blood pressure, but here lately over the last several weeks his blood pressures have been 90-100s/60s.  They had advised him to stop taking his blood pressure medications except for furosemide.  Wife notes that he has had some increase in his lower extremity swelling.  He did admit to sometimes getting choked up when eating, but reported  that it was infrequent.  His wife wants him to remain a full code for now, and states that she will bring back in the paperwork that clarifies his wishes.  In route with EMS he was reported to have incontinence of stool and vomited.  He was hypotensive 85/40 and was given 150 mL this of normal saline with repeat blood pressure 101/43.  Labs noted hemoglobin 12.9, platelets 114, sodium 129, chloride 96, CO2 21, BUN 16, creatinine 1.06, albumin 2.9, AST 43, BNP 214.5, troponin 37->44.  Chest x-ray noted stable cardiomegaly with bilateral lower lobe opacities left worse than the right concerning for possible aspiration pneumonia.  CT scan of the head and cervical spine did not note any acute fractures.  Initial ABG noted pH of 7.401, PCO2 36.3, and PO2 76.  Urinalysis did not show any acute signs of infection.  He had initially been given 500 mL of normal saline IV fluids while in the ED started on Rocephin.  Review of Systems: As mentioned in the history of present illness. All other systems reviewed and are negative. Past Medical History:  Diagnosis Date   Arthritis    Cancer [facility])    Coronary atherosclerosis of native coronary artery    Depression    Esophageal cancer (HCC)    Essential tremor    High cholesterol    Hypertension    Hypertension    Hypogonadism male    Obstructive chronic bronchitis with exacerbation (HCC)    Obstructive sleep  apnea (adult) (pediatric)    no cpap   Other and unspecified hyperlipidemia    Other emphysema ([place])    Proteinuria 2019   has seen a nephrologist   Shortness of breath    with excertion   Situational stress    Type II or unspecified type diabetes mellitus without mention of complication, not stated as uncontrolled    type 2   Unspecified essential hypertension    Past Surgical History:  Procedure Laterality Date   CARDIAC CATHETERIZATION     IR IMAGING GUIDED PORT INSERTION  04/24/2021   IR KYPHO LUMBAR INC FX REDUCE BONE BX UNI/BIL CANNULATION  INC/IMAGING  06/13/2018   L lens implant     R knee replacement x2     REVERSE SHOULDER ARTHROPLASTY Left 03/26/2019   Procedure: REVERSE SHOULDER ARTHROPLASTY;  Surgeon: Justice Britain, MD;  Location: [facility];  Service: Orthopedics;  Laterality: Left;  181min   TONSILLECTOMY     TONSILLECTOMY AND ADENOIDECTOMY     vascectomy     Social History:  reports that he has been smoking cigarettes. He has a 55.00 pack-year smoking history. He has never used smokeless tobacco. He reports current alcohol use of about 1.0 standard drink per week. He reports that he does not use drugs.  Allergies  Allergen Reactions   Iodinated Contrast Media Anaphylaxis   Moxifloxacin Swelling    REACTION: GI upset, throat "tightened up"   Penicillins Anaphylaxis    Did it involve swelling of the face/tongue/throat, SOB, or low BP? Yes Did it involve sudden or severe rash/hives, skin peeling, or any reaction on the inside of your mouth or nose? No Did you need to seek medical attention at a hospital or doctor's office? Yes When did it last happen?   been a while     If all above answers are NO, may proceed with cephalosporin use.    Shellfish-Derived Products Anaphylaxis   Amoxicillin Other (See Comments)   Chlorhexidine     Port not accessed   Other Other (See Comments)   Latex Rash    Over long periods of time    Family History  Problem Relation Age of Onset   Emphysema Father    Heart disease Father    Clotting disorder Mother    Kidney cancer Child     Prior to Admission medications   Medication Sig Start Date End Date Taking? Authorizing Provider  acetaminophen (TYLENOL) 500 MG tablet Take 650 mg by mouth every 8 (eight) hours as needed for mild pain (pain).    [provider]  albuterol (VENTOLIN HFA) 108 (90 Base) MCG/ACT inhaler Inhale 1 puff into the lungs every 6 (six) hours as needed for wheezing. 04/24/21   [provider]  ALPRAZolam Duanne Moron) 0.5 MG tablet Take 0.5 mg by  mouth 2 (two) times daily as needed for anxiety.    [provider]  amiodarone (PACERONE) 200 MG tablet Take 2 tablets (400 mg total) by mouth 2 (two) times daily for 7 days, THEN 1 tablet (200 mg total) daily. 08/01/21 08/08/22  Evans Lance, MD  apixaban (ELIQUIS) 5 MG TABS tablet Take 1 tablet (5 mg total) by mouth 2 (two) times daily. 07/21/21   Ghimire, Henreitta Leber, MD  atorvastatin (LIPITOR) 40 MG tablet Take 1 tablet (40 mg total) by mouth daily. 07/23/12   Weber, Damaris Hippo, PA-C  Calcium Carb-Cholecalciferol (CALCIUM 600+D3 PO) Take 1 tablet by mouth daily.    [provider]  Cholecalciferol (VITAMIN D) 50 MCG (2000 UT) tablet Take 2,000 Units by mouth daily.    [provider]  doxycycline (VIBRA-TABS) 100 MG tablet Take by mouth.    [provider]  escitalopram (LEXAPRO) 10 MG tablet Take 10 mg by mouth daily.    [provider]  furosemide (LASIX) 40 MG tablet Take 1 tablet (40 mg total) by mouth daily. Patient not taking: Reported on 09/12/2021 07/22/21 07/22/22  Jonetta Osgood, MD  gabapentin (NEURONTIN) 600 MG tablet Take 600 mg by mouth 3 (three) times daily. 09/20/21   [provider]  HYDROmorphone (DILAUDID) 8 MG tablet 1 tablet as needed 10/02/21   [provider]  lidocaine-prilocaine (EMLA) cream Apply 1 application topically as needed. 04/24/21   Orson Slick, MD  magnesium oxide (MAG-OX) 400 MG tablet Take 1 tablet (400 mg total) by mouth daily. 07/21/21   Ghimire, Henreitta Leber, MD  Multiple Vitamins-Minerals (CENTRUM SILVER) tablet daily in the afternoon.    [provider]  MYRBETRIQ 50 MG TB24 tablet Take 50 mg by mouth daily. 12/26/20   [provider]  ondansetron (ZOFRAN ODT) 4 MG disintegrating tablet Take 1 tablet (4 mg total) by mouth every 8 (eight) hours as needed for nausea or vomiting. 07/21/21   Ghimire, Henreitta Leber, MD  pantoprazole (PROTONIX) 40 MG tablet Take 1 tablet (40 mg total) by  mouth 2 (two) times daily. 07/21/21   Ghimire, Henreitta Leber, MD  potassium chloride SA (KLOR-CON) 20 MEQ tablet Take 2 tablets (40 mEq total) by mouth daily. 07/21/21   Ghimire, Henreitta Leber, MD  predniSONE (DELTASONE) 50 MG tablet Take 1 tablet 13 hours, 7 hours and 1 hour prior to CT Scan 07/12/21   Orson Slick, MD  primidone (MYSOLINE) 50 MG tablet Take 1 tablet (50 mg total) by mouth in the morning and at bedtime. 06/21/21   Tat, Eustace Quail, DO  prochlorperazine (COMPAZINE) 10 MG tablet Take 1 tablet (10 mg total) by mouth every 6 (six) hours as needed for nausea or vomiting. 04/24/21   Orson Slick, MD  rOPINIRole (REQUIP) 0.5 MG tablet Take 0.5 mg by mouth at bedtime.    [provider]  Semaglutide,0.25 or 0.5MG /DOS, (OZEMPIC, 0.25 OR 0.5 MG/DOSE,) 2 MG/1.5ML SOPN Ozempic 0.25 mg or 0.5 mg (2 mg/1.5 mL) subcutaneous pen injector  INJECT 0.5 MG SUBCUTANEOUSLY ONCE WEEKLY    [provider]  sucralfate (CARAFATE) 1 GM/10ML suspension Take 10 mLs (1 g total) by mouth 4 (four) times daily -  with meals and at bedtime. Patient not taking: Reported on 09/12/2021 07/21/21   Jonetta Osgood, MD  tamsulosin (FLOMAX) 0.4 MG CAPS capsule Take 0.4 mg by mouth 2 (two) times daily. 01/03/21   [provider]  TESTOSTERONE IM Inject 1 mL into the muscle every 14 (fourteen) days.    [provider]  tiZANidine (ZANAFLEX) 4 MG tablet Take 4 mg by mouth 3 (three) times daily as needed. 01/23/21   [provider]  trolamine salicylate (ASPERCREME) 10 % cream Apply 1 application topically 2 (two) times daily as needed for muscle pain.     [provider]    Physical Exam: Vitals:   10/26/21 1115 10/26/21 1130 10/26/21 1200 10/26/21 1230  BP: 104/65 102/72 98/62 104/66  Pulse: (!) 109 (!) 106 (!) 103 (!) 103  Resp: 18 16 17 18   Temp:      TempSrc:      SpO2:  100% 100% 99% 100%  Weight:      Height:       Exam  Constitutional: Chronically  ill-appearing male who appears to be in some distress Eyes: PERRL, lids and conjunctivae normal ENMT: Mucous membranes are dry posterior pharynx clear of any exudate or lesions. Neck: normal, supple, no JVD appreciated. Respiratory: Tachypneic with decreased overall aeration noted on the left mid to lower lung field more so than the right.  Mild wheezes appreciated. Cardiovascular: Irregular irregular.  At least +1 pitting bilateral lower extremity edema.  Port-A-Cath present of the chest wall. Abdomen: no tenderness, no masses palpated. No hepatosplenomegaly. Bowel sounds positive.  Musculoskeletal: no clubbing / cyanosis.  Patient complains of tenderness all over Skin: He has bruising present. Neurologic: CN 2-12 grossly intact. Sensation intact, DTR normal. Strength 5/5 in all 4.  Psychiatric: Normal judgment and insight. Alert and oriented x 3. Normal mood.    Data Reviewed:    Assessment and Plan: Falls secondary to weakness Patient presents with complaints of weakness after having several falls here recently.  Suspect symptoms are likely secondary to dehydration in the setting of poor p.o. intake on chemotherapy.  Patient has been given IV fluids due to hypotension. -Admit medical telemetry bed -Hold diuretics -Check TSH -PT/OT to eval and treat -Transitions of care consulted for possible need of home health  Acute respiratory failure with hypoxia secondary to pneumonia- (present on admission) Patient presents with complaints of weakness and productive cough.  Chest x-ray concerning for bilateral infiltrates worse on the left than on the right.  ABG noted pH 7.401, P CO2 36.3, and PaO2 of 76.  He has been placed on 3 L of nasal cannula oxygen with O2 saturations 92%.  BNP 214.5.  He did admit to intermittently getting choked up with eating and given that he has esophageal cancer suspect he is possibly aspirating.  At this time patient and wife declined need for formal speech  evaluation.  Patient has been started on Rocephin IV. On the differential includes pulmonary edema, but seems less likely. -Aspiration precaution -Elevate head of the bed greater than 30 degrees -Continuous pulse oximetry with nasal cannula oxygen maintain O2 saturation greater than 92%. -Check procalcitonin -Incentive spirometry and flutter valve -Continue empiric antibiotics of Rocephin and added on doxycycline IV for atypical coverage  Cardiomyopathy ([place]) Last echocardiogram noted EF of 65 to 70% with apex noted to be akinetic indicating a possible stress cardiomyopathy versus distal LAD disease with no signs of LV thrombus in 07/2021.  BNP was noted to be 214, but no significant JVD appreciated on physical exam.  -Strict intake and output -Daily weight -Furosemide currently on hold due to hypotension.  Reassess and determine when medically appropriate to restart  Hyponatremia- (present on admission) Acute on chronic.  Review of records notes that patient's sodium levels have been slowly trending down currently 129 on admission.  He had still been taking  furosemide 40 mg daily.  He has been given 500 mL bolus of IV fluids -Held furosemide -Orders placed to check urine sodium -Normal saline IV fluids at 75 mL/h -Recheck sodium levels in a.m.  Paroxysmal atrial fibrillation ([place])- (present on admission) Patient appears to be currently rate controlled.  Home medications include amiodarone 200 mg daily. -Continue amiodarone and Eliquis  Malignant neoplasm of lower third of esophagus ([place])- (present on admission) Patient is currently undergoing chemotherapy last infusion 2/8 and followed by Dr. Lorenso Courier of oncology. -Dr. Lorenso Courier added to the treatment team -  Continue Dilaudid as needed for pain  COPD (chronic obstructive pulmonary disease); chronic bronchitis- (present on admission) Patient has some mild expiratory wheezes intermittently on physical exam. -Breathing treatments as  needed -If symptoms persist may warrant steroids  Hypoalbuminemia- (present on admission) On admission albumin was noted to be low at 2.9.  Wife notes that patient has had poor p.o. intake.  Noted to have bilateral lower extremity edema which may be secondary to hypoalbuminemia. -Check prealbumin in a.m.  -Glucerna  Hypotension- (present on admission) Arrival to EMS patient was noted to have blood pressure as low as 85/40.  Blood pressure improved after receiving IV fluids.  MAP currently maintained greater than 65. -Holding diuretics at this time -IV fluids as noted above  Pancytopenia due to antineoplastic chemotherapy ([place])- (present on admission) On admission patient was noted to have hemoglobin 12.9 with platelet count 114 which appears similar to priors.  -Continue to monitor  BPH (benign prostatic hyperplasia)- (present on admission) - Continue Flomax  Elevated troponin- (present on admission) High-sensitivity troponin 37->47. -Check EKG  History of COVID-19- (present on admission) Patient had been treated for COVID-19 infection in January with paxlovid.  Controlled type 2 diabetes mellitus with complication, without long-term current use of insulin ([place])- (present on admission) On admission glucose elevated up to 178.  Last available hemoglobin A1c was 5.9 06/2021.  Home medications include Ozempic. -Hypoglycemic protocols -CBGs before every meal with very sensitive SSI -Adjust regimen as needed      DVT prophylaxis Eliquis Advance Care Planning:   Code Status: Full Code wife states that she will bring in his advanced directives and would like him to be a full code until able to  Consults: None  Family Communication: Updated wife at bedside  Severity of Illness: The appropriate patient status for this patient is INPATIENT. Inpatient status is judged to be reasonable and necessary in order to provide the required intensity of service to ensure the patient's safety.  The patient's presenting symptoms, physical exam findings, and initial radiographic and laboratory data in the context of their chronic comorbidities is felt to place them at high risk for further clinical deterioration. Furthermore, it is not anticipated that the patient will be medically stable for discharge from the hospital within 2 midnights of admission.   * I certify that at the point of admission it is my clinical judgment that the patient will require inpatient hospital care spanning beyond 2 midnights from the point of admission due to high intensity of service, high risk for further deterioration and high frequency of surveillance required.*  Author: Norval Morton, MD 10/26/2021 1:39 PM  For on call review [email or web address].

## 2021-10-26 NOTE — ED Notes (Signed)
RT adjusted pt O2 3L on nasal canula

## 2021-10-26 NOTE — Progress Notes (Signed)
IVT consulted regarding pt having port with chemo running.  IVT reached out to AD, Marianna Payment, RN whom advised if regular hospital pump, ED RN can discontinue, but if CADD, IVT will need to discontinue.  RN Romelle Starcher states pump in a backpack.  Advised to order yellow bucket to place Chemo meds in.  RN acknowledged and will place new consult when bucket at bedside.

## 2021-10-26 NOTE — Assessment & Plan Note (Addendum)
-  On admission glucose elevated up to 178.   -Last available hemoglobin A1c was 5.9 06/2021.  Home medications include Ozempic. -Hypoglycemic protocols -CBGs before every meal with very sensitive SSI -Adjust regimen as needed -CBGs ranging from 92-140

## 2021-10-26 NOTE — ED Notes (Signed)
Polly NT at bedside. Katie TRN

## 2021-10-26 NOTE — Assessment & Plan Note (Addendum)
-   Continue Flomax 

## 2021-10-26 NOTE — ED Provider Notes (Signed)
[facility] EMERGENCY DEPARTMENT Provider Note   CSN: [id number] Arrival date & time: 10/26/21  0272     History  Chief Complaint  Patient presents with   Fall   Weakness    Justin Trujillo. is a 76 y.o. male.  The history is provided by the patient, the spouse and medical records. No language interpreter was used.  Fall This is a recurrent problem. The problem occurs rarely. The problem has not changed since onset.Associated symptoms include chest pain and abdominal pain. Pertinent negatives include no headaches and no shortness of breath. Nothing aggravates the symptoms. Nothing relieves the symptoms. He has tried nothing for the symptoms. The treatment provided no relief.  Weakness Associated symptoms: abdominal pain and chest pain   Associated symptoms: no cough, no diarrhea, no dizziness, no dysuria, no fever, no headaches, no nausea, no shortness of breath and no vomiting       Home Medications Prior to Admission medications   Medication Sig Start Date End Date Taking? Authorizing Provider  acetaminophen (TYLENOL) 500 MG tablet Take 650 mg by mouth every 8 (eight) hours as needed for mild pain (pain).    [provider]  albuterol (VENTOLIN HFA) 108 (90 Base) MCG/ACT inhaler Inhale 1 puff into the lungs every 6 (six) hours as needed for wheezing. 04/24/21   [provider]  ALPRAZolam Duanne Moron) 0.5 MG tablet Take 0.5 mg by mouth 2 (two) times daily as needed for anxiety.    [provider]  amiodarone (PACERONE) 200 MG tablet Take 2 tablets (400 mg total) by mouth 2 (two) times daily for 7 days, THEN 1 tablet (200 mg total) daily. 08/01/21 08/08/22  Evans Lance, MD  apixaban (ELIQUIS) 5 MG TABS tablet Take 1 tablet (5 mg total) by mouth 2 (two) times daily. 07/21/21   Ghimire, Henreitta Leber, MD  atorvastatin (LIPITOR) 40 MG tablet Take 1 tablet (40 mg total) by mouth daily. 07/23/12   Weber, Damaris Hippo, PA-C  Calcium Carb-Cholecalciferol  (CALCIUM 600+D3 PO) Take 1 tablet by mouth daily.    [provider]  Cholecalciferol (VITAMIN D) 50 MCG (2000 UT) tablet Take 2,000 Units by mouth daily.    [provider]  doxycycline (VIBRA-TABS) 100 MG tablet Take by mouth.    [provider]  escitalopram (LEXAPRO) 10 MG tablet Take 10 mg by mouth daily.    [provider]  furosemide (LASIX) 40 MG tablet Take 1 tablet (40 mg total) by mouth daily. Patient not taking: Reported on 09/12/2021 07/22/21 07/22/22  Jonetta Osgood, MD  gabapentin (NEURONTIN) 600 MG tablet Take 600 mg by mouth 3 (three) times daily. 09/20/21   [provider]  HYDROmorphone (DILAUDID) 8 MG tablet 1 tablet as needed 10/02/21   [provider]  lidocaine-prilocaine (EMLA) cream Apply 1 application topically as needed. 04/24/21   Orson Slick, MD  magnesium oxide (MAG-OX) 400 MG tablet Take 1 tablet (400 mg total) by mouth daily. 07/21/21   Ghimire, Henreitta Leber, MD  Multiple Vitamins-Minerals (CENTRUM SILVER) tablet daily in the afternoon.    [provider]  MYRBETRIQ 50 MG TB24 tablet Take 50 mg by mouth daily. 12/26/20   [provider]  ondansetron (ZOFRAN ODT) 4 MG disintegrating tablet Take 1 tablet (4 mg total) by mouth every 8 (eight) hours as needed for nausea or vomiting. 07/21/21   Ghimire, Henreitta Leber, MD  pantoprazole (PROTONIX) 40 MG tablet Take 1 tablet (40 mg  total) by mouth 2 (two) times daily. 07/21/21   Ghimire, Henreitta Leber, MD  potassium chloride SA (KLOR-CON) 20 MEQ tablet Take 2 tablets (40 mEq total) by mouth daily. 07/21/21   Ghimire, Henreitta Leber, MD  predniSONE (DELTASONE) 50 MG tablet Take 1 tablet 13 hours, 7 hours and 1 hour prior to CT Scan 07/12/21   Orson Slick, MD  primidone (MYSOLINE) 50 MG tablet Take 1 tablet (50 mg total) by mouth in the morning and at bedtime. 06/21/21   Tat, Eustace Quail, DO  prochlorperazine (COMPAZINE) 10 MG tablet Take 1 tablet (10 mg total) by mouth  every 6 (six) hours as needed for nausea or vomiting. 04/24/21   Orson Slick, MD  rOPINIRole (REQUIP) 0.5 MG tablet Take 0.5 mg by mouth at bedtime.    [provider]  Semaglutide,0.25 or 0.5MG /DOS, (OZEMPIC, 0.25 OR 0.5 MG/DOSE,) 2 MG/1.5ML SOPN Ozempic 0.25 mg or 0.5 mg (2 mg/1.5 mL) subcutaneous pen injector  INJECT 0.5 MG SUBCUTANEOUSLY ONCE WEEKLY    [provider]  sucralfate (CARAFATE) 1 GM/10ML suspension Take 10 mLs (1 g total) by mouth 4 (four) times daily -  with meals and at bedtime. Patient not taking: Reported on 09/12/2021 07/21/21   Jonetta Osgood, MD  tamsulosin (FLOMAX) 0.4 MG CAPS capsule Take 0.4 mg by mouth 2 (two) times daily. 01/03/21   [provider]  TESTOSTERONE IM Inject 1 mL into the muscle every 14 (fourteen) days.    [provider]  tiZANidine (ZANAFLEX) 4 MG tablet Take 4 mg by mouth 3 (three) times daily as needed. 01/23/21   [provider]  trolamine salicylate (ASPERCREME) 10 % cream Apply 1 application topically 2 (two) times daily as needed for muscle pain.     [provider]      Allergies    Iodinated contrast media, Moxifloxacin, Penicillins, Shellfish-derived products, Amoxicillin, Chlorhexidine, Other, and Latex    Review of Systems   Review of Systems  Constitutional:  Positive for fatigue. Negative for chills and fever.  HENT:  Negative for congestion.   Eyes:  Negative for visual disturbance.  Respiratory:  Negative for cough, chest tightness, shortness of breath and wheezing.   Cardiovascular:  Positive for chest pain. Negative for leg swelling.  Gastrointestinal:  Positive for abdominal pain. Negative for constipation, diarrhea, nausea and vomiting.  Genitourinary:  Negative for dysuria and flank pain.  Musculoskeletal:  Positive for back pain and neck pain. Negative for neck stiffness.  Skin:  Positive for wound. Negative for rash.  Neurological:  Positive for weakness and  light-headedness. Negative for dizziness and headaches.  Psychiatric/Behavioral:  Positive for confusion.   All other systems reviewed and are negative.  Physical Exam Updated Vital Signs BP 127/66 (BP Location: Right Arm)    Pulse (!) 136    Temp 98.6 F (37 C) (Oral)    Resp 15    Ht 5\' 6"  (1.676 m)    Wt 71.9 kg    SpO2 97%    BMI 25.57 kg/m  Physical Exam Vitals and nursing note reviewed.  Constitutional:      General: He is not in acute distress.    Appearance: He is well-developed. He is not ill-appearing, toxic-appearing or diaphoretic.  HENT:     Head: Normocephalic and atraumatic.     Nose: No congestion or rhinorrhea.     Mouth/Throat:     Mouth: Mucous membranes are moist.     Pharynx: No  oropharyngeal exudate or posterior oropharyngeal erythema.  Eyes:     Extraocular Movements: Extraocular movements intact.     Conjunctiva/sclera: Conjunctivae normal.     Pupils: Pupils are equal, round, and reactive to light.  Cardiovascular:     Rate and Rhythm: Tachycardia present. Rhythm irregular.     Heart sounds: No murmur heard. Pulmonary:     Effort: Pulmonary effort is normal. No respiratory distress.     Breath sounds: Normal breath sounds. No wheezing, rhonchi or rales.  Chest:     Chest wall: Tenderness present.  Abdominal:     Palpations: Abdomen is soft.     Tenderness: There is no abdominal tenderness. There is no right CVA tenderness, left CVA tenderness, guarding or rebound.  Musculoskeletal:        General: Tenderness present. No swelling.     Cervical back: Neck supple. Tenderness present.     Right lower leg: No edema.     Left lower leg: No edema.  Skin:    General: Skin is warm and dry.     Capillary Refill: Capillary refill takes less than 2 seconds.     Findings: Bruising present. No erythema or rash.  Neurological:     General: No focal deficit present.     Mental Status: He is alert.     Sensory: No sensory deficit.     Motor: No weakness.   Psychiatric:        Mood and Affect: Mood normal.    ED Results / Procedures / Treatments   Labs (all labs ordered are listed, but only abnormal results are displayed) Labs Reviewed  COMPREHENSIVE METABOLIC PANEL - Abnormal; Notable for the following components:      Result Value   Sodium 129 (*)    Chloride 96 (*)    CO2 21 (*)    Glucose, Bld 178 (*)    Calcium 8.5 (*)    Total Protein 5.6 (*)    Albumin 2.9 (*)    AST 43 (*)    All other components within normal limits  CBC - Abnormal; Notable for the following components:   RBC 3.89 (*)    Hemoglobin 12.9 (*)    MCV 102.1 (*)    RDW 17.2 (*)    Platelets 114 (*)    All other components within normal limits  LACTIC ACID, PLASMA - Abnormal; Notable for the following components:   Lactic Acid, Venous 3.0 (*)    All other components within normal limits  PROTIME-INR - Abnormal; Notable for the following components:   Prothrombin Time 16.7 (*)    INR 1.4 (*)    All other components within normal limits  BRAIN NATRIURETIC PEPTIDE - Abnormal; Notable for the following components:   B Natriuretic Peptide 214.5 (*)    All other components within normal limits  I-STAT CHEM 8, ED - Abnormal; Notable for the following components:   Sodium 127 (*)    Chloride 96 (*)    Glucose, Bld 174 (*)    Calcium, Ion 1.01 (*)    All other components within normal limits  I-STAT ARTERIAL BLOOD GAS, ED - Abnormal; Notable for the following components:   pO2, Arterial 76 (*)    Sodium 129 (*)    Potassium 3.3 (*)    All other components within normal limits  TROPONIN I (HIGH SENSITIVITY) - Abnormal; Notable for the following components:   Troponin I (High Sensitivity) 37 (*)    All other components within  normal limits  TROPONIN I (HIGH SENSITIVITY) - Abnormal; Notable for the following components:   Troponin I (High Sensitivity) 44 (*)    All other components within normal limits  RESP PANEL BY RT-PCR (FLU A&B, COVID) ARPGX2  ETHANOL   AMMONIA  MAGNESIUM  URINALYSIS, ROUTINE W REFLEX MICROSCOPIC  BLOOD GAS, ARTERIAL  TSH  SAMPLE TO BLOOD BANK    EKG None  Radiology CT HEAD WO CONTRAST  Result Date: 10/26/2021 CLINICAL DATA:  Moderate to severe head trauma EXAM: CT HEAD WITHOUT CONTRAST CT CERVICAL SPINE WITHOUT CONTRAST TECHNIQUE: Multidetector CT imaging of the head and cervical spine was performed following the standard protocol without intravenous contrast. Multiplanar CT image reconstructions of the cervical spine were also generated. RADIATION DOSE REDUCTION: This exam was performed according to the departmental dose-optimization program which includes automated exposure control, adjustment of the mA and/or kV according to patient size and/or use of iterative reconstruction technique. COMPARISON:  None. FINDINGS: CT HEAD FINDINGS Brain: No evidence of acute infarction, hemorrhage, hydrocephalus, extra-axial collection or mass lesion/mass effect. 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 Cerebral volume loss, central predominant. Small, incidental calcification in the inter hemispheric fissure. Vascular: No hyperdense vessel or unexpected calcification. Skull: No acute fracture. Sinuses/Orbits: Bilateral cataract resection. Fluid levels that are low-density in the left maxillary and sphenoid sinuses. Left mastoid and middle ear opacification that is subtotal. CT CERVICAL SPINE FINDINGS Alignment: Reversal of cervical lordosis. Skull base and vertebrae: Streak artifact intermittent motion affects visualization, streak artifact heavily affecting the C1 and C2 levels. No convincing fracture. Generalized osteopenia. Soft tissues and spinal canal: No prevertebral fluid or swelling. No visible canal hematoma. Disc levels: Generalized degenerative disc narrowing and ridging. Multilevel facet spurring. Upper chest: Negative IMPRESSION: 1. No evidence of acute intracranial injury. Negative for cervical spine  fracture. 2. Left-sided sinusitis with fluid levels. Left mastoid and middle ear opacification. 3. 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 Electronically Signed   By: Jorje Guild M.D.   On: 10/26/2021 10:26   CT CERVICAL SPINE WO CONTRAST  Result Date: 10/26/2021 CLINICAL DATA:  Moderate to severe head trauma EXAM: CT HEAD WITHOUT CONTRAST CT CERVICAL SPINE WITHOUT CONTRAST TECHNIQUE: Multidetector CT imaging of the head and cervical spine was performed following the standard protocol without intravenous contrast. Multiplanar CT image reconstructions of the cervical spine were also generated. RADIATION DOSE REDUCTION: This exam was performed according to the departmental dose-optimization program which includes automated exposure control, adjustment of the mA and/or kV according to patient size and/or use of iterative reconstruction technique. COMPARISON:  None. FINDINGS: CT HEAD FINDINGS Brain: No evidence of acute infarction, hemorrhage, hydrocephalus, extra-axial collection or mass lesion/mass effect. 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 Cerebral volume loss, central predominant. Small, incidental calcification in the inter hemispheric fissure. Vascular: No hyperdense vessel or unexpected calcification. Skull: No acute fracture. Sinuses/Orbits: Bilateral cataract resection. Fluid levels that are low-density in the left maxillary and sphenoid sinuses. Left mastoid and middle ear opacification that is subtotal. CT CERVICAL SPINE FINDINGS Alignment: Reversal of cervical lordosis. Skull base and vertebrae: Streak artifact intermittent motion affects visualization, streak artifact heavily affecting the C1 and C2 levels. No convincing fracture. Generalized osteopenia. Soft tissues and spinal canal: No prevertebral fluid or swelling. No visible canal hematoma. Disc levels: Generalized degenerative disc narrowing and ridging. Multilevel facet spurring. Upper chest:  Negative IMPRESSION: 1. No evidence of acute intracranial injury. Negative for cervical spine fracture. 2. Left-sided  sinusitis with fluid levels. Left mastoid and middle ear opacification. 3. 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 Electronically Signed   By: Jorje Guild M.D.   On: 10/26/2021 10:26   DG Pelvis Portable  Result Date: 10/26/2021 CLINICAL DATA:  Found down.  Weakness EXAM: PORTABLE PELVIS 1-2 VIEWS COMPARISON:  10/20/2021 FINDINGS: Bones appear demineralized. There is no evidence of pelvic fracture or diastasis. No pelvic bone lesions are seen. Large volume of stool projects throughout the visualized colon. Atherosclerotic vascular calcifications are present. IMPRESSION: Negative. Electronically Signed   By: Davina Poke D.O.   On: 10/26/2021 09:33   DG Chest Port 1 View  Result Date: 10/26/2021 CLINICAL DATA:  Family found the patient on the floor, last seen yesterday after chemotherapy when patient complained of weakness. Patient does not remember falling. EXAM: PORTABLE CHEST 1 VIEW COMPARISON:  CT examination dated October 20, 2021 FINDINGS: The heart is enlarged. There are bilateral lower lobe opacities, left worse than the right concerning for pneumonia including aspiration pneumonia. Bilateral shoulder arthroplasty. Right IJ access MediPort with distal tip in the SVC. IMPRESSION: 1. Stable cardiomegaly. 2. Bilateral lower lobe opacities, left worse than the right concerning for pneumonia including aspiration pneumonia. Follow-up examination to resolution is recommended. Electronically Signed   By: Keane Police D.O.   On: 10/26/2021 09:35   CT CHEST ABDOMEN PELVIS WO CONTRAST  Result Date: 10/26/2021 CLINICAL DATA:  76 year old male presents for evaluation following fall. Patient also with history of esophageal neoplasm. EXAM: CT CHEST, ABDOMEN AND PELVIS WITHOUT CONTRAST TECHNIQUE: Multidetector CT imaging of the chest, abdomen and pelvis was performed  following the standard protocol without IV contrast. RADIATION DOSE REDUCTION: This exam was performed according to the departmental dose-optimization program which includes automated exposure control, adjustment of the mA and/or kV according to patient size and/or use of iterative reconstruction technique. COMPARISON:  October 20, 2021. FINDINGS: CT CHEST FINDINGS Cardiovascular: The aorta shows smooth contours without adjacent stranding. No mediastinal hematoma. Aortic dilation to approximately 4 cm greatest axial dimension is unchanged. Generalized aortic atherosclerosis. Signs of mitral annular calcification. Three-vessel coronary artery disease. No substantial pericardial effusion, stable small pericardial effusion when compared to previous imaging from just 6 days ago RIGHT-sided Port-A-Cath terminates at the caval to atrial junction. Mediastinum/Nodes: Patulous esophagus similar to the prior exam. Mild thickening of the distal esophagus also unchanged in this patient with known history of esophageal neoplasm. No mediastinal hematoma or adenopathy. No axillary or thoracic inlet adenopathy. Lungs/Pleura: No pneumothorax. Reticular opacities and nodular changes along the pleural surface with increasing conspicuity since previous imaging. Pleural based airspace disease with nodular features at the lung bases and along the anterior chest, these findings have developed since the previous exam of just 6 days ago. No pneumothorax. Material and the trachea and material layering in the mainstem bronchi extending into peripheral bronchi with signs of bronchial wall thickening. Musculoskeletal: See below for full musculoskeletal details. CT ABDOMEN PELVIS FINDINGS Hepatobiliary: Liver with smooth contours. Trace fluid adjacent to medial RIGHT hemi liver similar to previous imaging. No gross signs of hepatic trauma. No Peri hepatic fluid. No pericholecystic stranding. Pancreas: Pancreatic atrophy without signs of adjacent  inflammation. Spleen: Trace stranding adjacent to the spleen. Minimal fluid adjacent to the spleen is similar to the study of October 20, 2021. No gross splenic abnormality. Adrenals/Urinary Tract: Mild bilateral adrenal thickening. Perinephric stranding bilaterally perhaps slightly increased compared to previous imaging. Generalized increase in fascial thickening and edema albeit mild  since the previous study. No hydronephrosis.  No perinephric hematoma. Stomach/Bowel: Small hiatal hernia and distal esophageal thickening as described. No signs of bowel dilation to suggest obstruction of the small bowel. The appendix is normal. Colon is stool filled without signs of adjacent stranding. No signs of pneumatosis. No pneumoperitoneum. No pelvic fluid. Vascular/Lymphatic: Aortic atherosclerosis. No sign of aneurysm. Smooth contour of the IVC. There is no gastrohepatic or hepatoduodenal ligament lymphadenopathy. No retroperitoneal or mesenteric lymphadenopathy. No pelvic sidewall lymphadenopathy. Atherosclerosis is moderate to marked. Vascular structures not well assessed given the lack of intravenous contrast. No focal stranding or hematoma adjacent to the abdominal aorta or pelvic vessels. Reproductive: Unremarkable by CT. Urinary bladder is moderately distended extending to the sacral promontory. Other: No fluid in the pelvis. Trace fluid about the liver and spleen similar to recent comparison imaging. Musculoskeletal: No displaced rib fractures. Mildly limited assessment due to respiratory motion. Bony pelvis without signs of fracture. Extensive spinal degenerative changes and osteopenia. Post bilateral shoulder arthroplasty. Visualized clavicles and scapulae are intact. Signs of prior L3-L4 spinal fusion and cement augmentation at L4 showing no change. Spinal compression fractures with similar appearance at the T11-T12 level based on recent comparison imaging with approximally 30% loss of height at these levels.  Spinal alignment is unchanged. Sternum grossly intact with some artifact crossing the anterior sternum due to motion. IMPRESSION: 1. No evidence of acute traumatic injury to the chest, abdomen or pelvis. 2. Peripheral nodular parenchymal consolidative changes with marked worsening since the recent comparison imaging potentially post/drug treatment related given pattern seen and rapid interval progression. Correlate with any respiratory symptoms. Findings are felt to have an overlay of acute inflammatory changes related to aspiration given findings in the bronchi. However, subpleural changes extend along the anterior chest which would be atypical for purely aspiration related changes. 3. Small hiatal hernia and distal esophageal thickening also unchanged in this patient with known history of esophageal neoplasm. 4. Mild generalized edema with perinephric stranding and fascial edema in the abdomen not substantially changed compared to recent imaging. 5. Unchanged appearance of T11-T12 compression fractures. 6. Aortic atherosclerosis. Three-vessel coronary artery disease. 7. Ascending thoracic aortic aneurysm. Recommend annual imaging followup by CTA or MRA. This recommendation follows 2010 ACCF/AHA/AATS/ACR/ASA/SCA/SCAI/SIR/STS/SVM Guidelines for the Diagnosis and Management of Patients with Thoracic Aortic Disease. Circulation. 2010; 121: T062-I948. Aortic aneurysm NOS (ICD10-I71.9) Aortic Atherosclerosis (ICD10-I70.0). Electronically Signed   By: Zetta Bills M.D.   On: 10/26/2021 10:31    Procedures Procedures    Medications Ordered in ED Medications  sodium chloride flush (NS) 0.9 % injection 10-40 mL (has no administration in time range)  sodium chloride flush (NS) 0.9 % injection 10-40 mL (has no administration in time range)  sodium chloride 0.9 % bolus 500 mL (0 mLs Intravenous Stopped 10/26/21 1255)    ED Course/ Medical Decision Making/ A&P                           Medical Decision  Making Amount and/or Complexity of Data Reviewed Labs: ordered. Radiology: ordered.  Risk Decision regarding hospitalization.   Justin Trujillo. is a 76 y.o. male with a past medical history significant for hypertension, hyperlipidemia, COPD, diabetes, CAD, paroxysmal atrial fibrillation on Eliquis therapy, sleep apnea, and esophageal cancer currently on chronic IV chemotherapy through a right chest port who presents with multiple falls and altered mental status and pain.  According to family, patient had a fall last week  and hit his head but did not get evaluated.  He is otherwise been doing well until yesterday when he had a fall in [employer] in the evening.  They helped him get back to bed and then he was found on the ground this morning.  Patient is complaining of pain in his neck, entire back, chest, abdomen, left hip, and was found to be hypoxic with oxygen saturations in the 80s on room air, hypotensive in route with 85 systolic and EMS gave some fluids with improvement to around 182 systolic, and somnolent.  Patient is having some abnormal breathing patterns and is complaining of all the pain.  On my initial arrival, I am concerned about the patient.  Patient was very somnolent but with loud voice was able to answer questions.  He opened his eyes and had symmetric and reactive pupils about 3 mm bilaterally.  Normal extract movements.  Patient has abrasion and bruising to his chin and forehead from falls.  Unclear if they are from today or before.  On my exam, neck was slightly tender, entire back was tender, chest, abdomen, and left hip were tender to palpation.  Otherwise he was moving all extremities and normal strength and sensation and pulses in extremities.  Breath sounds were symmetric and equal bilaterally and he has a right chest port that is still having chemotherapy go through it.  Abdomen was tender with bowel sounds.  Due to his fall on Eliquis and altered mental status I am  concerned about possible head injury among other injuries.  Patient made a level 2 trauma.  Patient's blood pressure has not been hypotensive here but if it does drop we will likely downgrade to level 1.  Patient had a portable chest and pelvis x-ray which I reviewed at the bedside and interpreted was not seeing evidence of acute pneumothorax or significant fractures initially.  Radiology will further review.  The trauma nurse came and helped with care of the patient.  Patient be taken to CT for pan scan without contrast given anaphylactic reaction to contrast in the past.  We will get other medical work-up as well.  Patient does have edema in both legs with family reports is relatively new.  We will add on BNP and other cardiac labs.  Anticipate admission for altered mental status, new somnolence, edema, hypoxia, and A-fib that is still going between 120 and 140 in rate.  Anticipate admission.   Traumatic imaging returned reassuring.  There was no evidence of acute fracture but it did show evidence of worsened pulmonary abnormalities and x-ray showed concern for pneumonia.  On CT it showed some likely superimposed inflammatory changes and I am concerned about pneumonia.  Family further clarified he has been having some cough recently.  He recently got over bronchitis after having COVID back near Christmas.  We will start antibiotics.  I spoke with pharmacy who will recommend antibiotic regimen given intolerances and allergies.  Due to his new oxygen requirement, he will be admitted.         Final Clinical Impression(s) / ED Diagnoses Final diagnoses:  Trauma  Hypoxia  Community acquired pneumonia, unspecified laterality  Cough, unspecified type  Hyponatremia  Hypervolemia, unspecified hypervolemia type    Clinical Impression: 1. Hypoxia   2. Trauma   3. Community acquired pneumonia, unspecified laterality   4. Cough, unspecified type   5. Hyponatremia   6. Hypervolemia,  unspecified hypervolemia type     Disposition: Admit  This note was prepared  with assistance of Systems analyst. Occasional wrong-word or sound-a-like substitutions may have occurred due to the inherent limitations of voice recognition software.      Byrant Valent, Gwenyth Allegra, MD 10/26/21 [phone]

## 2021-10-26 NOTE — Assessment & Plan Note (Addendum)
-  Arrival to EMS patient was noted to have blood pressure as low as 85/40.  Blood pressure improved after receiving IV fluids.  -MAP currently maintained greater than 65 and will need to continue to monitor. -Continue to Holding diuretics at this time and consider resuming in the AM  -IV fluids now stopped -Repeat Blood Pressure this AM was 118/92 this AM

## 2021-10-26 NOTE — Assessment & Plan Note (Addendum)
-  Patient presents with complaints of weakness and productive cough.   -Chest x-ray concerning for bilateral infiltrates worse on the left than on the right.  ABG noted pH 7.401, P CO2 36.3, and PaO2 of 76.  He has been placed on 3 L of nasal cannula oxygen with O2 saturations 92%.  BNP 214.5.   -He did admit to intermittently getting choked up with eating and given that he has esophageal cancer suspect he is possibly aspirating.   -SpO2: 100 % O2 Flow Rate (L/min): 3 L/min; Or has been weaned -Xopenex/Atrovent, Flutter Valve, and Incentive Spirometry  -At this time patient and wife declined need for formal speech evaluation.   -Patient has been started on Rocephin . On the differential includes pulmonary edema, but seems less likely.  -Aspiration precautions -Elevate head of the bed greater than 30 degrees -Continuous pulse oximetry with nasal cannula oxygen maintain O2 saturation greater than 92%. -Check procalcitonin elevated at 1.75 -Incentive spirometry and flutter valve -Continue empiric antibiotics of Rocephin and added on Doxycycline IV for atypical coverage and changed to po Doxy and po Cefuroxime -Repeat CXR int he AM as repeat CXR here showed "Largely resolved hazy right lung base opacities. Stable hazy streaky opacities in the peripheral mid to lower left lung, favoring pneumonia. Stable cardiomegaly." -No stable to be discharged and transition to p.o. antibiotics

## 2021-10-26 NOTE — Assessment & Plan Note (Addendum)
-  On admission patient was noted to have hemoglobin 12.9 with platelet count 114 which appears similar to priors.  -Continue to monitor and repeat CBC on 10/29/2021 shows a patient is pancytopenic now with a WBC of 0.8, hemoglobin/hematocrit of 9.4/28.1, and a platelet count of 85 and this is in the setting of his chemotherapy -Today's CBC shows that he has WBC of 1.7, hemoglobin/hematocrit 9.2/28.0 and the platelet count of 97 -Patient was getting Granix 300 mg subcu daily and received a dose yesterday and will receive a dose tonight prior to discharge; [place] today was 1200 -Checking Blood Cx x2 and showed no growth to date 2 days -Repeat CXR this a.m. showed "Largely resolved hazy right lung base opacities.Stable hazy streaky opacities in the peripheral mid to lower left lung, favoring pneumonia. Stable cardiomegaly." -Anemia panel done and showed an iron level of 138, UIBC 68, TIBC 207, saturation ratio 67%, ferritin level 734, folate level 55.7 and vitamin B12 level 738 -Continue with empiric antibiotics for now and repeat CBC within 1 week

## 2021-10-26 NOTE — ED Notes (Signed)
EDP at bedside activating level 2 trauma

## 2021-10-27 ENCOUNTER — Inpatient Hospital Stay: Payer: PPO

## 2021-10-27 DIAGNOSIS — E876 Hypokalemia: Secondary | ICD-10-CM

## 2021-10-27 DIAGNOSIS — R197 Diarrhea, unspecified: Secondary | ICD-10-CM

## 2021-10-27 DIAGNOSIS — I959 Hypotension, unspecified: Secondary | ICD-10-CM

## 2021-10-27 LAB — BASIC METABOLIC PANEL
Anion gap: 8 (ref 5–15)
BUN: 16 mg/dL (ref 8–23)
CO2: 23 mmol/L (ref 22–32)
Calcium: 8.2 mg/dL — ABNORMAL LOW (ref 8.9–10.3)
Chloride: 98 mmol/L (ref 98–111)
Creatinine, Ser: 0.74 mg/dL (ref 0.61–1.24)
GFR, Estimated: >60 mL/min (ref >60)
Glucose, Bld: 118 mg/dL — ABNORMAL HIGH (ref 70–99)
Potassium: 3.4 mmol/L — ABNORMAL LOW (ref 3.5–5.1)
Sodium: 129 mmol/L — ABNORMAL LOW (ref 135–145)

## 2021-10-27 LAB — CBC
HCT: 32.9 % — ABNORMAL LOW (ref 39.0–52.0)
Hemoglobin: 11.1 g/dL — ABNORMAL LOW (ref 13.0–17.0)
MCH: 33.6 pg (ref 26.0–34.0)
MCHC: 33.7 g/dL (ref 30.0–36.0)
MCV: 99.7 fL (ref 80.0–100.0)
Platelets: 118 10*3/uL — ABNORMAL LOW (ref 150–400)
RBC: 3.3 MIL/uL — ABNORMAL LOW (ref 4.22–5.81)
RDW: 17.2 % — ABNORMAL HIGH (ref 11.5–15.5)
WBC: 2.5 10*3/uL — ABNORMAL LOW (ref 4.0–10.5)
nRBC: 0 % (ref 0.0–0.2)

## 2021-10-27 LAB — C DIFFICILE QUICK SCREEN W PCR REFLEX
C Diff antigen: NEGATIVE
C Diff interpretation: NOT DETECTED
C Diff toxin: NEGATIVE

## 2021-10-27 LAB — GLUCOSE, CAPILLARY
Glucose-Capillary: 97 mg/dL (ref 70–99)
Glucose-Capillary: 145 mg/dL — ABNORMAL HIGH (ref 70–99)

## 2021-10-27 LAB — CBG MONITORING, ED
Glucose-Capillary: 100 mg/dL — ABNORMAL HIGH (ref 70–99)
Glucose-Capillary: 104 mg/dL — ABNORMAL HIGH (ref 70–99)

## 2021-10-27 LAB — PREALBUMIN: Prealbumin: 13.7 mg/dL — ABNORMAL LOW (ref 18–38)

## 2021-10-27 MED ORDER — SUCRALFATE 1 G PO TABS
1.0000 g | ORAL_TABLET | Freq: Three times a day (TID) | ORAL | Status: DC
  Administered 2021-10-27 – 2021-10-30 (×11): 1 g via ORAL
  Filled 2021-10-27 (×11): qty 1

## 2021-10-27 MED ORDER — POTASSIUM CHLORIDE CRYS ER 20 MEQ PO TBCR
40.0000 meq | EXTENDED_RELEASE_TABLET | Freq: Two times a day (BID) | ORAL | Status: AC
  Administered 2021-10-27 (×2): 40 meq via ORAL
  Filled 2021-10-27 (×2): qty 2

## 2021-10-27 NOTE — Evaluation (Signed)
Physical Therapy Evaluation Patient Details Name: Justin Trujillo. MRN: [id number] DOB: 09/13/1946 Today's Date: 10/27/2021  History of Present Illness  Justin Trujillo. is a 76 y.o. male admitted 10/26/21 presents with complaints of weakness after having having falls at home. In route with EMS he was reported to have incontinence of stool and vomited.  He was hypotensive 85/40. Chest x-ray noted stable cardiomegaly with bilateral lower lobe opacities left worse than the right concerning for possible aspiration pneumonia.  CT scan of the head and cervical spine did not note any acute fractures PMH significant for esophageal adenocarcinoma currently receiving chemotherapy, hypertension, hyperlipidemia, PAF on Eliquis, DM type II, OSA, COVID (Jan 2023) and GERD.  Clinical Impression  Pt admitted secondary to problem above with deficits below. Pt fatiguing easily and requiring min A +2 for mobility tasks using bilateral UE support. Pt also complaining of pain in RLE. Given current deficits, will need continued acute PT prior to return home to progress mobility in order to safely d/c home with resumption of HHPT. If pt does not progress well, may need to consider SNF level therapies. Will continue to follow acutely.      Recommendations for follow up therapy are one component of a multi-disciplinary discharge planning process, led by the attending physician.  Recommendations may be updated based on patient status, additional functional criteria and insurance authorization.  Follow Up Recommendations Home health PT (pending progression; if he does not progress well, SNF)    Assistance Recommended at Discharge Frequent or constant Supervision/Assistance  Patient can return home with the following  A little help with walking and/or transfers;A little help with bathing/dressing/bathroom;Assistance with cooking/housework;Help with stairs or ramp for entrance;Assist for transportation    Equipment  Recommendations Other (comment) (TBD)  Recommendations for Other Services       Functional Status Assessment Patient has had a recent decline in their functional status and demonstrates the ability to make significant improvements in function in a reasonable and predictable amount of time.     Precautions / Restrictions Precautions Precautions: Fall Precaution Comments: has had multiple falls at home Restrictions Weight Bearing Restrictions: No      Mobility  Bed Mobility Overal bed mobility: Needs Assistance Bed Mobility: Rolling, Supine to Sit, Sit to Supine Rolling: Supervision   Supine to sit: Min assist Sit to supine: Min assist   General bed mobility comments: Able to roll from side to side for clean up following large BM. Min A for trunk and LE assist.    Transfers Overall transfer level: Needs assistance Equipment used: 2 person hand held assist Transfers: Sit to/from Stand Sit to Stand: Min assist, +2 physical assistance           General transfer comment: Min A +2 for lift assist and steadying.    Ambulation/Gait Ambulation/Gait assistance: Min assist, +2 physical assistance   Assistive device: 2 person hand held assist         General Gait Details: took side steps at EOB, however, pt with increased fatigue and pain, so mobility limited.  Stairs            Wheelchair Mobility    Modified Rankin (Stroke Patients Only)       Balance Overall balance assessment: Needs assistance Sitting-balance support: No upper extremity supported, Feet supported Sitting balance-Leahy Scale: Fair     Standing balance support: Bilateral upper extremity supported, During functional activity Standing balance-Leahy Scale: Poor Standing balance comment: Reliant on BUE support  Pertinent Vitals/Pain Pain Assessment Pain Assessment: Faces Faces Pain Scale: Hurts even more Pain Location: RLE Pain Descriptors /  Indicators: Cramping Pain Intervention(s): Limited activity within patient's tolerance, Monitored during session, Repositioned    Home Living Family/patient expects to be discharged to:: Private residence Living Arrangements: Spouse/significant other Available Help at Discharge: Family Type of Home: House Home Access: Stairs to enter Entrance Stairs-Rails: Right Entrance Stairs-Number of Steps: 5   Home Layout: Two level;Able to live on main level with bedroom/bathroom Home Equipment: Grab bars - tub/shower;Shower seat;Hand held Museum/gallery curator (4 wheels)      Prior Function Prior Level of Function : Needs assist             Mobility Comments: Uses rollator for mobility ADLs Comments: Wife assists with showering. Able to dress himself.     Hand Dominance        Extremity/Trunk Assessment   Upper Extremity Assessment Upper Extremity Assessment: Defer to OT evaluation    Lower Extremity Assessment Lower Extremity Assessment: Generalized weakness (increased cramping in R calf)    Cervical / Trunk Assessment Cervical / Trunk Assessment: Kyphotic  Communication   Communication: HOH  Cognition Arousal/Alertness: Awake/alert Behavior During Therapy: WFL for tasks assessed/performed Overall Cognitive Status: Impaired/Different from baseline Area of Impairment: Problem solving                             Problem Solving: Slow processing General Comments: Had not told anyone he had BM, but stated he was aware he had one.        General Comments      Exercises     Assessment/Plan    PT Assessment Patient needs continued PT services  PT Problem List Decreased strength;Decreased activity tolerance;Decreased balance;Decreased mobility;Decreased knowledge of use of DME;Decreased knowledge of precautions       PT Treatment Interventions DME instruction;Gait training;Stair training;Therapeutic exercise;Functional mobility  training;Therapeutic activities;Balance training;Patient/family education    PT Goals (Current goals can be found in the Care Plan section)  Acute Rehab PT Goals Patient Stated Goal: to go home PT Goal Formulation: With patient Time For Goal Achievement: 11/10/21 Potential to Achieve Goals: Good    Frequency Min 3X/week     Co-evaluation PT/OT/SLP Co-Evaluation/Treatment: Yes Reason for Co-Treatment: Necessary to address cognition/behavior during functional activity;For patient/therapist safety PT goals addressed during session: Mobility/safety with mobility         AM-PAC PT "6 Clicks" Mobility  Outcome Measure Help needed turning from your back to your side while in a flat bed without using bedrails?: A Little Help needed moving from lying on your back to sitting on the side of a flat bed without using bedrails?: A Little Help needed moving to and from a bed to a chair (including a wheelchair)?: A Little Help needed standing up from a chair using your arms (e.g., wheelchair or bedside chair)?: A Little Help needed to walk in hospital room?: A Lot Help needed climbing 3-5 steps with a railing? : A Lot 6 Click Score: 16    End of Session Equipment Utilized During Treatment: Gait belt Activity Tolerance: Patient limited by fatigue;Patient limited by pain Patient left: in bed;with call bell/phone within reach (on stretcher in ED) Nurse Communication: Mobility status PT Visit Diagnosis: Unsteadiness on feet (R26.81);Muscle weakness (generalized) (M62.81);History of falling (Z91.81)    Time: 4098-1191 PT Time Calculation (min) (ACUTE ONLY): 32 min   Charges:   PT Evaluation $PT Eval  Moderate Complexity: 1 Mod          Reuel Derby, PT, DPT  Acute Rehabilitation Services  Pager: [phone] Office: [phone]   Rudean Hitt 10/27/2021, 12:58 PM

## 2021-10-27 NOTE — Assessment & Plan Note (Addendum)
-  SpO2: 100 % O2 Flow Rate (L/min): 3 L/min -Continuous pulse oximetry maintain O2 saturation greater than 90% -Continue supplemental oxygen via nasal cannula and wean O2 as tolerated  -Patient was weaned off of supplemental oxygen and had ambulatory O2 screen prior to discharge and improved significantly.  He is stable to be discharged and his chest x-ray shows improvement when to follow-up with PCP and medical oncology within 1 to 2 weeks and repeat chest x-ray in 3 to 6 weeks

## 2021-10-27 NOTE — Hospital Course (Addendum)
The patient is a 76 year old chronically ill-appearing Caucasian male with a past medical history significant for per #2 esophageal adenocarcinoma currently receiving chemotherapy, hypertension, hyperlipidemia, PAF on anticoagulation with Eliquis, diabetes mellitus type 2, OSA, GERD his other comorbidities who presents after complaints of weakness having multiple falls at home.  His wife was present at bedside and gave additional history and apparently patient had fallen twice last week on separate days but then fell yesterday evening following his chemotherapy infusion.  At that time the patient's wife was able to get him back into the bed but he report he fell early again in the morning and then she called EMS.  He reports that he had COVID back in January of this year and was treated with Paxlovid.  Since that time he has had a persistent cough and the cough has been productive sometimes he has had a hard time comprehending up.  He been treated for doxycycline with the possibility of bronchitis but overall his appetite has been poor and has not been eating much.  He complained of pain all over and approximately 3 to 4 weeks ago he discharged from oxycodone to Dilaudid for pain control.  To her knowledge patient has not had any fevers is not on oxygen at baseline.  The wife also notes that he has had high blood pressures but over the last several weeks they have been lower.  His blood pressure medications were stopped except furosemide.  She notes that he has had some lower extremity swelling and he did admit to getting choked up when eating but reports this is infrequent.  He was brought to the ED he was reported to have incontinence of stool and had vomited.  He was hypotensive on arrival and was bolused with normal saline with repeat blood pressure improving.  Chest x-ray showed stable cardiomegaly with bilateral lower lobe opacities left side was worse on right with concerns for aspiration pneumonia.   He  had a CT of the head and  cervical spine that did not show any acute fractures.  He was placed on IV Rocephin and Doxycycline and admitted for acute respiratory failure with hypoxia secondary to pneumonia as well as his cardiomyopathy.  He continues to have some significant diarrhea so we will need to work this up for infection.  C. difficile was negative and GI pathogen panel was negative as well.  Patient's procalcitonin level slightly elevated so we will continue antibiotics.  Continues to feel weak and did not have blood cultures on admission so obtained and this showed no growth to date.  Patient was neutropenic and his [place] is 800 yesterday but improved today to 1200.  We will need to continue monitor carefully.  Sodium and phosphorus are on the lower side so we will replete and this was done with IV sodium Phos prior to discharge.  Patient's chest x-ray showed improvement and he ambulated with physical therapy and they recommend home health PT.  He did not desaturate and felt fairly well.  Chest x-ray shows improvement and patient clinically feels improved.  I spoke with his medical oncologist who felt that from their standpoint we could discharge him home and have him follow-up later this week.  He was transitioned to oral antibiotics and discharged home in satisfactory and stable condition with home health PT and OT

## 2021-10-27 NOTE — Assessment & Plan Note (Addendum)
-  Has had significant amount of diarrhea so check a GI pathogen panel as well as a C. Difficile; C Difficile is Negative and GI Pathogen Panel Negative -Continue to monitor carefully and if he rules out for infectious etiology can likely start antidiarrheals but has not had any Diarrhea but had a normal large bowel movement today

## 2021-10-27 NOTE — Evaluation (Signed)
Occupational Therapy Evaluation Patient Details Name: Justin Trujillo. MRN: [id number] DOB: 1945-11-25 Today's Date: 10/27/2021   History of Present Illness Justin Trujillo. is a 76 y.o. male admitted 10/26/21 presents with complaints of weakness after having having falls at home. In route with EMS he was reported to have incontinence of stool and vomited.  He was hypotensive 85/40. Chest x-ray noted stable cardiomegaly with bilateral lower lobe opacities left worse than the right concerning for possible aspiration pneumonia.  CT scan of the head and cervical spine did not note any acute fractures PMH significant for esophageal adenocarcinoma currently receiving chemotherapy, hypertension, hyperlipidemia, PAF on Eliquis, DM type II, OSA, COVID (Jan 2023) and GERD.   Clinical Impression   Pt is typically mod I with Rollator, typically set up for ADL - wife assists with LB dressing PRN. Today Pt is max A +2 for bed level peri care due to very large bowel movement. This took significant time during the evaluation. Pt was able to assist with front bathing (peri area) and followed cues well. He was able to come sit EOB and with min A +2 stood, and took limited steps up the ED stretcher for repositioning with min A +2. Pt overall mod A for ADL at this time due to deficits in balance, stamina, pain (in RLE during mobility - reports as cramping), On RA throughout session with SpO2 >90%. OT recommending [place] post-acute now and following closely for progression as he may need SNF level care as wife is unable to physically assist him (he does have a daughter and grandchildren (not sure how old) that live upstairs and may be able to physically assist).      Recommendations for follow up therapy are one component of a multi-disciplinary discharge planning process, led by the attending physician.  Recommendations may be updated based on patient status, additional functional criteria and insurance authorization.    Follow Up Recommendations  Home health OT (watch for progression, may need SNF)    Assistance Recommended at Discharge Frequent or constant Supervision/Assistance  Patient can return home with the following A lot of help with walking and/or transfers;A lot of help with bathing/dressing/bathroom;Assistance with cooking/housework;Assistance with feeding;Assist for transportation;Help with stairs or ramp for entrance    Functional Status Assessment  Patient has had a recent decline in their functional status and demonstrates the ability to make significant improvements in function in a reasonable and predictable amount of time.  Equipment Recommendations  None recommended by OT (Pt has appropriate DME)    Recommendations for Other Services PT consult     Precautions / Restrictions Precautions Precautions: Fall Precaution Comments: has had multiple falls at home Restrictions Weight Bearing Restrictions: No      Mobility Bed Mobility Overal bed mobility: Needs Assistance Bed Mobility: Rolling, Supine to Sit, Sit to Supine Rolling: Supervision   Supine to sit: Min assist Sit to supine: Min assist   General bed mobility comments: Able to roll from side to side for clean up following large BM. Min A for trunk and LE assist.    Transfers Overall transfer level: Needs assistance Equipment used: 2 person hand held assist Transfers: Sit to/from Stand Sit to Stand: Min assist, +2 physical assistance           General transfer comment: Min A +2 for lift assist and steadying.      Balance Overall balance assessment: Needs assistance Sitting-balance support: No upper extremity supported, Feet supported Sitting balance-Leahy Scale: Fair  Standing balance support: Bilateral upper extremity supported, During functional activity Standing balance-Leahy Scale: Poor Standing balance comment: Reliant on BUE support                           ADL either performed or  assessed with clinical judgement   ADL Overall ADL's : Needs assistance/impaired Eating/Feeding: Set up;Sitting   Grooming: Set up;Wash/dry hands;Sitting   Upper Body Bathing: Moderate assistance   Lower Body Bathing: Moderate assistance   Upper Body Dressing : Moderate assistance   Lower Body Dressing: Maximal assistance   Toilet Transfer: Minimal assistance;+2 for physical assistance;+2 for safety/equipment;Ambulation (2 person HHA)   Toileting- Clothing Manipulation and Hygiene: Maximal assistance;Sit to/from stand       Functional mobility during ADLs: Minimal assistance;+2 for physical assistance;+2 for safety/equipment;Cueing for safety;Cueing for sequencing General ADL Comments: decreased balance, strength, activity tolerance     Vision Baseline Vision/History: 1 Wears glasses Ability to See in Adequate Light: 0 Adequate Patient Visual Report: No change from baseline       Perception     Praxis      Pertinent Vitals/Pain Pain Assessment Pain Assessment: Faces Faces Pain Scale: Hurts even more Pain Location: RLE, hx of chronic lower back pain Pain Descriptors / Indicators: Cramping Pain Intervention(s): Limited activity within patient's tolerance, Monitored during session, Repositioned     Hand Dominance Left   Extremity/Trunk Assessment Upper Extremity Assessment Upper Extremity Assessment: Generalized weakness   Lower Extremity Assessment Lower Extremity Assessment: Defer to PT evaluation   Cervical / Trunk Assessment Cervical / Trunk Assessment: Kyphotic   Communication Communication Communication: HOH   Cognition Arousal/Alertness: Awake/alert Behavior During Therapy: WFL for tasks assessed/performed Overall Cognitive Status: Impaired/Different from baseline Area of Impairment: Problem solving                             Problem Solving: Slow processing General Comments: Had not told anyone he had BM, but stated he was aware he  had one.     General Comments  wife present and able to confirm home set up information    Exercises     Shoulder Instructions      Home Living Family/patient expects to be discharged to:: Private residence Living Arrangements: Spouse/significant other Available Help at Discharge: Family Type of Home: House Home Access: Stairs to enter [employer] of Steps: 5 Entrance Stairs-Rails: Right Home Layout: Two level;Able to live on main level with bedroom/bathroom     Bathroom Shower/Tub: Tub only;Walk-in shower   Bathroom Toilet: Standard     Home Equipment: Grab bars - tub/shower;Shower seat;Hand held Museum/gallery curator (4 wheels)          Prior Functioning/Environment Prior Level of Function : Needs assist             Mobility Comments: Uses rollator for mobility ADLs Comments: Wife assists with showering. Able to dress himself.        OT Problem List: Decreased strength;Decreased activity tolerance;Impaired balance (sitting and/or standing);Decreased safety awareness;Pain      OT Treatment/Interventions: Self-care/ADL training;DME and/or AE instruction;Therapeutic activities;Patient/family education;Balance training    OT Goals(Current goals can be found in the care plan section) Acute Rehab OT Goals Patient Stated Goal: to stop falling OT Goal Formulation: With patient Time For Goal Achievement: 11/10/21 Potential to Achieve Goals: Fair ADL Goals Pt Will Perform Grooming: with modified independence;sitting Pt Will Perform Upper Body  Dressing: with set-up;sitting Pt Will Perform Lower Body Dressing: with min assist;sit to/from stand Pt Will Transfer to Toilet: with min guard assist;ambulating Pt Will Perform Toileting - Clothing Manipulation and hygiene: with supervision;sit to/from stand Additional ADL Goal #1: Pt will verbalize 3 strategies to prevent falls in the home environment during ADL with no cues  OT Frequency: Min 2X/week     Co-evaluation PT/OT/SLP Co-Evaluation/Treatment: Yes Reason for Co-Treatment: Necessary to address cognition/behavior during functional activity;For patient/therapist safety;To address functional/ADL transfers PT goals addressed during session: Mobility/safety with mobility;Balance OT goals addressed during session: ADL's and self-care;Strengthening/ROM      AM-PAC OT "6 Clicks" Daily Activity     Outcome Measure Help from another person eating meals?: A Little Help from another person taking care of personal grooming?: A Little Help from another person toileting, which includes using toliet, bedpan, or urinal?: A Lot Help from another person bathing (including washing, rinsing, drying)?: A Lot Help from another person to put on and taking off regular upper body clothing?: A Little Help from another person to put on and taking off regular lower body clothing?: A Lot 6 Click Score: 15   End of Session Equipment Utilized During Treatment: Gait belt Nurse Communication: Mobility status;Precautions  Activity Tolerance: Patient tolerated treatment well;Patient limited by pain Patient left: in bed;with call bell/phone within reach;with nursing/sitter in room  OT Visit Diagnosis: Unsteadiness on feet (R26.81);Other abnormalities of gait and mobility (R26.89);Repeated falls (R29.6);History of falling (Z91.81);Muscle weakness (generalized) (M62.81);Adult, failure to thrive (R62.7);Pain Pain - Right/Left: Right Pain - part of body: Leg                Time: 9379-0240 OT Time Calculation (min): 32 min Charges:  OT General Charges $OT Visit: 1 Visit OT Evaluation $OT Eval Moderate Complexity: Elmo OTR/L Acute Rehabilitation Services Pager: [phone] Office: [place] 10/27/2021, 2:00 PM

## 2021-10-27 NOTE — Progress Notes (Signed)
Progress Note   Patient: Joven Mom. RKY:[id number] DOB: 1946-03-30 DOA: 10/26/2021     1 DOS: the patient was seen and examined on 10/27/2021   Brief hospital course: The patient is a 76 year old chronically ill-appearing Caucasian male with a past medical history significant for per #2 esophageal adenocarcinoma currently receiving chemotherapy, hypertension, hyperlipidemia, PAF on anticoagulation with Eliquis, diabetes mellitus type 2, OSA, GERD his other comorbidities who presents after complaints of weakness having multiple falls at home.  His wife was present at bedside and gave additional history and apparently patient had fallen twice last week on separate days but then fell yesterday evening following his chemotherapy infusion.  At that time the patient's wife was able to get him back into the bed but he report he fell early again in the morning and then she called EMS.  He reports that he had COVID back in January of this year and was treated with Paxlovid.  Since that time he has had a persistent cough and the cough has been productive sometimes he has had a hard time comprehending up.  He been treated for doxycycline with the possibility of bronchitis but overall his appetite has been poor and has not been eating much.  He complained of pain all over and approximately 3 to 4 weeks ago he discharged from oxycodone to Dilaudid for pain control.  To her knowledge patient has not had any fevers is not on oxygen at baseline.  The wife also notes that he has had high blood pressures but over the last several weeks they have been lower.  His blood pressure medications were stopped except furosemide.  She notes that he has had some lower extremity swelling and he did admit to getting choked up when eating but reports this is infrequent.  He was brought to the ED he was reported to have incontinence of stool and had vomited.  He was hypotensive on arrival and was bolused with normal saline with repeat  blood pressure improving.  Chest x-ray showed stable cardiomegaly with bilateral lower lobe opacities left side was worse on right with concerns for aspiration pneumonia.  He had a CT of the head done cervical spine that did not show any acute infiltrates.  He was placed on IV Rocephin and admitted for acute respiratory failure with hypoxia secondary to pneumonia as well as his cardiomyopathy.  He continues to have some significant diarrhea so we will need to work this up for infection.  Assessment and Plan: * Falls- (present on admission) -PT/OT evaluated and getting IVF  Hypokalemia -K+ was 3.4 -Replete with p.o. KCl 40 mg twice daily x2 doses and check mag level -Continue monitor and treat as necessary -Repeat CMP in a.m.  Diarrhea - Has had significant amount of diarrhea so check a GI pathogen panel as well as a C. Difficile -Continue to monitor carefully and if he rules out for infectious etiology can likely start antidiarrheals   Cardiomyopathy ([place]) -Last echocardiogram noted EF of 65 to 70% with apex noted to be akinetic indicating a possible stress cardiomyopathy versus distal LAD disease with no signs of LV thrombus in 07/2021.   -BNP was noted to be 214, but no significant JVD appreciated on physical exam.  -Strict intake and output -Daily weight -Furosemide currently on hold due to hypotension.  -Reassess and determine when medically appropriate to restart we will continue IV fluid hydration given that he appeared volume depleted  Hypoalbuminemia- (present on admission) -On admission albumin was  noted to be low at 2.9.  Wife notes that patient has had poor p.o. intake.  -Noted to have bilateral lower extremity edema which may be secondary to hypoalbuminemia. -Check prealbumin and was 13.7 -Glucerna and will need nutritionist evaluation  Hypotension- (present on admission) -Arrival to EMS patient was noted to have blood pressure as low as 85/40.  Blood pressure improved after  receiving IV fluids.  -MAP currently maintained greater than 65. -Continue to Holding diuretics at this time -IV fluids as noted above and continue maintenance IV fluids at 75 MLS per hour for now  Acute respiratory failure with hypoxia (HCC)- (present on admission) -SpO2: 100 % O2 Flow Rate (L/min): 3 L/min -Continuous pulse oximetry maintain O2 saturation greater than 90% -Continue supplemental oxygen via nasal cannula and wean O2 as tolerated  -Patient will need an ambulatory home O2 screen prior to discharge and will need repeat chest x-ray in the morning  Hyponatremia- (present on admission) -Acute on chronic.  Review of records notes that patient's sodium levels have been slowly trending down currently 129 on admission.   -He had still been taking  furosemide 40 mg daily.  He has been given 500 mL bolus of IV fluids -Held furosemide -Orders placed to check urine sodium -Normal saline IV fluids at 75 mL/h we will continue -Recheck sodium levels in a.m and showed the patient had a sodium of 129 again   Elevated troponin- (present on admission) -High-sensitivity troponin 37->47. -Check EKG and likely this is demand ischemia  Pancytopenia due to antineoplastic chemotherapy ([place])- (present on admission) -On admission patient was noted to have hemoglobin 12.9 with platelet count 114 which appears similar to priors.  -Continue to monitor and repeat CBC shows a WBC count of 2.5, hemoglobin/hematocrit of 11.1/32.9 and a platelet count of 118; we will need to obtain a CBC with differential in the morning and calculate an ANC -Continue with empiric antibiotics for now and repeat  Falls secondary to weakness -Patient presents with complaints of weakness after having several falls here recently.  Suspect symptoms are likely secondary to dehydration in the setting of poor p.o. intake on chemotherapy and now diarrhea.  Patient has been given IV fluids due to hypotension. -Admit medical  telemetry bed -Hold diuretics -Check TSH was 0.935 -PT/OT to eval and treat and recommending home health for now but may progress to SNF if he does not improve -Transitions of care consulted for possible need of home health  History of COVID-19- (present on admission) -Patient had been treated for COVID-19 infection in January with paxlovid.  Acute respiratory failure with hypoxia secondary to pneumonia- (present on admission) -Patient presents with complaints of weakness and productive cough.   -Chest x-ray concerning for bilateral infiltrates worse on the left than on the right.  ABG noted pH 7.401, P CO2 36.3, and PaO2 of 76.  He has been placed on 3 L of nasal cannula oxygen with O2 saturations 92%.  BNP 214.5.   -He did admit to intermittently getting choked up with eating and given that he has esophageal cancer suspect he is possibly aspirating.   -At this time patient and wife declined need for formal speech evaluation.   -Patient has been started on Rocephin IV. On the differential includes pulmonary edema, but seems less likely. -Aspiration precautions -Elevate head of the bed greater than 30 degrees -Continuous pulse oximetry with nasal cannula oxygen maintain O2 saturation greater than 92%. -Check procalcitonin -Incentive spirometry and flutter valve -Continue empiric antibiotics  of Rocephin and added on doxycycline IV for atypical coverage  BPH (benign prostatic hyperplasia)- (present on admission) -Continue Flomax  Paroxysmal atrial fibrillation ([place])- (present on admission) Patient appears to be currently rate controlled.  Home medications include amiodarone 200 mg daily. -Continue amiodarone and anticoagulation with Eliquis  Malignant neoplasm of lower third of esophagus (HCC)- (present on admission) -Patient is currently undergoing chemotherapy last infusion 2/8 and followed by Dr. Lorenso Courier of oncology. -Dr. Lorenso Courier added to the treatment team -Continue Dilaudid as needed  for pain  COPD (chronic obstructive pulmonary disease); chronic bronchitis- (present on admission) -Patient has some mild expiratory wheezes intermittently on physical exam. -Breathing treatments as needed -If symptoms persist may warrant steroids -Continue to Monitor Respiratory Status carefully   Controlled type 2 diabetes mellitus with complication, without long-term current use of insulin ([place])- (present on admission) -On admission glucose elevated up to 178.   -Last available hemoglobin A1c was 5.9 06/2021.  Home medications include Ozempic. -Hypoglycemic protocols -CBGs before every meal with very sensitive SSI -Adjust regimen as needed    Subjective: Seen and examined at bedside he was having some diarrhea.  Felt weak and a little short of breath.  Has fallen multiple times.  No lightheadedness or dizziness.  No other concerns or complaints at this time  Physical Exam: Vitals:   10/27/21 1000 10/27/21 1200 10/27/21 1340 10/27/21 1628  BP: 126/67 97/63 130/74 (!) 126/114  Pulse: (!) 50 93 (!) 106 100  Resp: 18 14 18 17   Temp:   98.6 F (37 C) 98.6 F (37 C)  TempSrc:   Oral Oral  SpO2: 99% 96% 96% 100%  Weight:   69.1 kg   Height:   5\' 3"  (1.6 m)    Examination: Physical Exam:  Constitutional: Chronically ill-appearing Caucasian male lying in bed and appears calm but a little uncomfortable  eyes: PERRL, lids and conjunctivae normal, sclerae anicteric  Respiratory: Diminished to auscultation bilaterally with coarse breath sounds with some slight expiratory wheezing.  He is wearing supplemental oxygen via nasal cannula but is not tachypneic. Cardiovascular: RRR, no murmurs / rubs / gallops. S1 and S2 auscultated.  1+ extremity edema Abdomen: Soft, non-tender, Distended.  Bowel sounds positive.  GU: Deferred. Musculoskeletal: No clubbing / cyanosis of digits/nails. No joint deformity upper and lower extremities but does have some leg swelling worse on the left compared  to the right  Data Reviewed:  Independently interpreted and reviewed the patient's laboratory data including his BMP and CBC  Is pancytopenic now with a WBC of 2.5, hemoglobin/hematocrit 11.1/32.9 and a platelet count of 118.  He has a low prealbumin of 13.7 and sodium of 129 potassium 3.4  Family Communication: Discussed with the wife at bedside  Disposition: Status is: Inpatient Remains inpatient appropriate because: Remains generally weak and will need improvement in his labs and respiratory status prior to discharging   Planned Discharge Destination: Skilled nursing facility versus home health  DVT prophylaxis: Apixaban 5 mg po BID   Author: Raiford Noble, DO [facility]  10/27/2021 7:29 PM  For on call review [email or web address].

## 2021-10-27 NOTE — Assessment & Plan Note (Addendum)
-  K+ was 3.9 today  -Mag Level was 1.7 so will replete with IV Mag Sulfate 2 grams -Continue monitor and treat as necessary -Repeat CMP in a.m.

## 2021-10-27 NOTE — Assessment & Plan Note (Addendum)
-  PT/OT evaluated and IVF now stopped -[place]

## 2021-10-28 ENCOUNTER — Inpatient Hospital Stay (HOSPITAL_COMMUNITY): Payer: PPO

## 2021-10-28 DIAGNOSIS — R197 Diarrhea, unspecified: Secondary | ICD-10-CM

## 2021-10-28 LAB — COMPREHENSIVE METABOLIC PANEL
ALT: 20 U/L (ref 0–44)
AST: 22 U/L (ref 15–41)
Albumin: 2.1 g/dL — ABNORMAL LOW (ref 3.5–5.0)
Alkaline Phosphatase: 61 U/L (ref 38–126)
Anion gap: 5 (ref 5–15)
BUN: 10 mg/dL (ref 8–23)
CO2: 23 mmol/L (ref 22–32)
Calcium: 8.1 mg/dL — ABNORMAL LOW (ref 8.9–10.3)
Chloride: 105 mmol/L (ref 98–111)
Creatinine, Ser: 0.6 mg/dL — ABNORMAL LOW (ref 0.61–1.24)
GFR, Estimated: >60 mL/min (ref >60)
Glucose, Bld: 87 mg/dL (ref 70–99)
Potassium: 3.5 mmol/L (ref 3.5–5.1)
Sodium: 133 mmol/L — ABNORMAL LOW (ref 135–145)
Total Bilirubin: 0.5 mg/dL (ref 0.3–1.2)
Total Protein: 4.6 g/dL — ABNORMAL LOW (ref 6.5–8.1)

## 2021-10-28 LAB — GASTROINTESTINAL PANEL BY PCR, STOOL (REPLACES STOOL CULTURE)

## 2021-10-28 LAB — CBC WITH DIFFERENTIAL/PLATELET
Abs Immature Granulocytes: 0 10*3/uL (ref 0.00–0.07)
Basophils Absolute: 0 10*3/uL (ref 0.0–0.1)
Basophils Relative: 0 %
Eosinophils Absolute: 0 10*3/uL (ref 0.0–0.5)
Eosinophils Relative: 4 %
HCT: 28.6 % — ABNORMAL LOW (ref 39.0–52.0)
Hemoglobin: 9.4 g/dL — ABNORMAL LOW (ref 13.0–17.0)
Lymphocytes Relative: 13 %
Lymphs Abs: 0.1 10*3/uL — ABNORMAL LOW (ref 0.7–4.0)
MCH: 32.8 pg (ref 26.0–34.0)
MCHC: 32.9 g/dL (ref 30.0–36.0)
MCV: 99.7 fL (ref 80.0–100.0)
Monocytes Absolute: 0 10*3/uL — ABNORMAL LOW (ref 0.1–1.0)
Monocytes Relative: 3 %
Neutro Abs: 0.8 10*3/uL — ABNORMAL LOW (ref 1.7–7.7)
Neutrophils Relative %: 80 %
Platelets: 121 10*3/uL — ABNORMAL LOW (ref 150–400)
RBC: 2.87 MIL/uL — ABNORMAL LOW (ref 4.22–5.81)
RDW: 16.8 % — ABNORMAL HIGH (ref 11.5–15.5)
WBC: 1 10*3/uL — CL (ref 4.0–10.5)
nRBC: 0 % (ref 0.0–0.2)

## 2021-10-28 LAB — IRON AND TIBC
Iron: 138 ug/dL (ref 45–182)
Saturation Ratios: 67 % — ABNORMAL HIGH (ref 17.9–39.5)
TIBC: 207 ug/dL — ABNORMAL LOW (ref 250–450)
UIBC: 69 ug/dL

## 2021-10-28 LAB — GLUCOSE, CAPILLARY
Glucose-Capillary: 101 mg/dL — ABNORMAL HIGH (ref 70–99)
Glucose-Capillary: 116 mg/dL — ABNORMAL HIGH (ref 70–99)
Glucose-Capillary: 174 mg/dL — ABNORMAL HIGH (ref 70–99)
Glucose-Capillary: 137 mg/dL — ABNORMAL HIGH (ref 70–99)

## 2021-10-28 LAB — PROCALCITONIN: Procalcitonin: 1.75 ng/mL

## 2021-10-28 LAB — FERRITIN: Ferritin: 734 ng/mL — ABNORMAL HIGH (ref 24–336)

## 2021-10-28 LAB — RETICULOCYTES
Immature Retic Fract: 5.2 % (ref 2.3–15.9)
RBC.: 2.83 MIL/uL — ABNORMAL LOW (ref 4.22–5.81)
Retic Count, Absolute: 43.3 10*3/uL (ref 19.0–186.0)
Retic Ct Pct: 1.5 % (ref 0.4–3.1)

## 2021-10-28 LAB — VITAMIN B12: Vitamin B-12: 738 pg/mL (ref 180–914)

## 2021-10-28 LAB — MAGNESIUM: Magnesium: 1.9 mg/dL (ref 1.7–2.4)

## 2021-10-28 LAB — PHOSPHORUS: Phosphorus: 1.7 mg/dL — ABNORMAL LOW (ref 2.5–4.6)

## 2021-10-28 LAB — FOLATE: Folate: 55.7 ng/mL (ref >5.9)

## 2021-10-28 MED ORDER — GUAIFENESIN ER 600 MG PO TB12
1200.0000 mg | ORAL_TABLET | Freq: Two times a day (BID) | ORAL | Status: DC
  Administered 2021-10-28 – 2021-10-30 (×5): 1200 mg via ORAL
  Filled 2021-10-28 (×5): qty 2

## 2021-10-28 MED ORDER — IPRATROPIUM BROMIDE 0.02 % IN SOLN
0.5000 mg | Freq: Four times a day (QID) | RESPIRATORY_TRACT | Status: DC
  Administered 2021-10-28 (×2): 0.5 mg via RESPIRATORY_TRACT
  Filled 2021-10-28 (×2): qty 2.5

## 2021-10-28 MED ORDER — SODIUM CHLORIDE 0.9 % IV SOLN: INTRAVENOUS | Status: AC

## 2021-10-28 MED ORDER — SODIUM PHOSPHATES 45 MMOLE/15ML IV SOLN
30.0000 mmol | Freq: Once | INTRAVENOUS | Status: AC
  Administered 2021-10-28: 30 mmol via INTRAVENOUS
  Filled 2021-10-28: qty 10

## 2021-10-28 MED ORDER — LEVALBUTEROL HCL 0.63 MG/3ML IN NEBU
0.6300 mg | INHALATION_SOLUTION | Freq: Four times a day (QID) | RESPIRATORY_TRACT | Status: DC
  Administered 2021-10-28 (×2): 0.63 mg via RESPIRATORY_TRACT
  Filled 2021-10-28 (×2): qty 3

## 2021-10-28 MED ORDER — POTASSIUM CHLORIDE CRYS ER 20 MEQ PO TBCR
40.0000 meq | EXTENDED_RELEASE_TABLET | Freq: Once | ORAL | Status: AC
  Administered 2021-10-28: 40 meq via ORAL
  Filled 2021-10-28: qty 2

## 2021-10-28 MED ORDER — IPRATROPIUM BROMIDE 0.02 % IN SOLN
0.5000 mg | Freq: Two times a day (BID) | RESPIRATORY_TRACT | Status: DC
  Administered 2021-10-29 – 2021-10-30 (×3): 0.5 mg via RESPIRATORY_TRACT
  Filled 2021-10-28 (×4): qty 2.5

## 2021-10-28 MED ORDER — LEVALBUTEROL HCL 0.63 MG/3ML IN NEBU
0.6300 mg | INHALATION_SOLUTION | Freq: Two times a day (BID) | RESPIRATORY_TRACT | Status: DC
  Administered 2021-10-29 – 2021-10-30 (×3): 0.63 mg via RESPIRATORY_TRACT
  Filled 2021-10-28 (×4): qty 3

## 2021-10-28 MED ORDER — SALINE SPRAY 0.65 % NA SOLN
1.0000 | NASAL | Status: DC | PRN
  Administered 2021-10-29: 1 via NASAL
  Filled 2021-10-28: qty 44

## 2021-10-28 NOTE — Progress Notes (Signed)
CRITICAL VALUE STICKER  CRITICAL VALUE: WBC 1.0  RECEIVER (on-site recipient of call): Rodman Key RN  [place] NOTIFIED: [phone]  MESSENGER (representative from lab): Gardiner Fanti  MD NOTIFIED: Alfredia Ferguson MD  [place]: [phone]  RESPONSE:  Pending

## 2021-10-28 NOTE — Progress Notes (Signed)
Occupational Therapy Treatment °Patient Details °Name: Justin J Zywicki Jr. °MRN: [id number] °DOB: 09/03/1946 °Today's Date: 10/28/2021 ° ° °History of present illness Justin J Mule Jr. is a 76 y.o. male admitted 10/26/21 presents with complaints of weakness after having having falls at home. In route with EMS he was reported to have incontinence of stool and vomited.  He was hypotensive 85/40. Chest x-ray noted stable cardiomegaly with bilateral lower lobe opacities left worse than the right concerning for possible aspiration pneumonia.  CT scan of the head and cervical spine did not note any acute fractures PMH significant for esophageal adenocarcinoma currently receiving chemotherapy, hypertension, hyperlipidemia, PAF on Eliquis, DM type II, OSA, COVID (Jan 2023) and GERD. °  °OT comments ° Pt. Seen for skilled OT session.  Very motivated for participation but remains limited with frequent loose stools requiring assistance for clean up.  Able to complete bsc transfer to/from back to bed.  Sit/stand with min A. Moving well with side steps too.  Progress ADLS next session as pt. Able.  Current d/c recommendations remain appropriate.    ° °Recommendations for follow up therapy are one component of a multi-disciplinary discharge planning process, led by the attending physician.  Recommendations may be updated based on patient status, additional functional criteria and insurance authorization. °   °Follow Up Recommendations ° Home health OT  °  °Assistance Recommended at Discharge Frequent or constant Supervision/Assistance  °Patient can return home with the following ° A lot of help with walking and/or transfers;A lot of help with bathing/dressing/bathroom;Assistance with cooking/housework;Assistance with feeding;Assist for transportation;Help with stairs or ramp for entrance °  °Equipment Recommendations ° None recommended by OT  °  °Recommendations for Other Services   ° °  °Precautions / Restrictions  Precautions °Precautions: Fall °Precaution Comments: has had multiple falls at home  ° ° °  ° °Mobility Bed Mobility °Overal bed mobility: Needs Assistance °Bed Mobility: Rolling, Sidelying to Sit, Sit to Sidelying °Rolling: Supervision °Sidelying to sit: Min guard °  °  °Sit to sidelying: Min guard °General bed mobility comments: Able to roll from side to side for clean up following BM in the bed °  ° °Transfers °Overall transfer level: Needs assistance °Equipment used: Rolling walker (2 wheels) °Transfers: Sit to/from Stand, Bed to chair/wheelchair/BSC °Sit to Stand: Min guard °  °  °Step pivot transfers: Min assist °  °  °General transfer comment: moving easer today. able to transfer to/from bsc back to bed. and side step to hob and scoot prior to laying down °  °  °Balance   °  °  °  °  °  °  °  °  °  °  °  °  °  °  °  °  °  °  °   ° °ADL either performed or assessed with clinical judgement  ° °ADL Overall ADL's : Needs assistance/impaired °  °  °  °  °  °  °  °  °  °  °  °  °Toilet Transfer: Minimal assistance;Cueing for sequencing;Stand-pivot;Ambulation;BSC/3in1;Rolling walker (2 wheels) °  °Toileting- Clothing Manipulation and Hygiene: Set up;Sitting/lateral lean °  °  °  °Functional mobility during ADLs: Minimal assistance;Rolling walker (2 wheels) °General ADL Comments: pt. found incontinent of bowels in bed upon arrival.  total assist for clean up as it was liquid/loose stools. pt. able to assist with rolling L/R for care °  ° °Extremity/Trunk Assessment   °  °  °  °  °  ° °  Vision   °  °  °Perception   °  °Praxis   °  ° °Cognition Arousal/Alertness: Awake/alert °  °Overall Cognitive Status: Within Functional Limits for tasks assessed °  °  °  °  °  °  °  °  °  °  °  °  °  °  °  °  °  °  °  °   °Exercises   ° °  °Shoulder Instructions   ° ° °  °General Comments  Pt. And his wife love to dance. They know several different types.  It is both of their 3rd marriages and he is very much in love and enjoyed  sharing his story with me how they met.  ° ° °Pertinent Vitals/ Pain       Pain Assessment °Pain Assessment: No/denies pain ° °Home Living   °  °  °  °  °  °  °  °  °  °  °  °  °  °  °  °  °  °  ° °  °Prior Functioning/Environment    °  °  °  °   ° °Frequency ° Min 2X/week  ° ° ° ° °  °Progress Toward Goals ° °OT Goals(current goals can now be found in the care plan section) ° Progress towards OT goals: Progressing toward goals ° °   °Plan Discharge plan remains appropriate   ° °Co-evaluation ° ° °   °  °  °  °  ° °  °AM-PAC OT "6 Clicks" Daily Activity     °Outcome Measure ° ° Help from another person eating meals?: A Little °Help from another person taking care of personal grooming?: A Little °Help from another person toileting, which includes using toliet, bedpan, or urinal?: A Lot °Help from another person bathing (including washing, rinsing, drying)?: A Lot °Help from another person to put on and taking off regular upper body clothing?: A Little °Help from another person to put on and taking off regular lower body clothing?: A Lot °6 Click Score: 15 ° °  °End of Session Equipment Utilized During Treatment: Rolling walker (2 wheels) ° °OT Visit Diagnosis: Unsteadiness on feet (R26.81);Other abnormalities of gait and mobility (R26.89);Repeated falls (R29.6);History of falling (Z91.81);Muscle weakness (generalized) (M62.81);Adult, failure to thrive (R62.7);Pain °Pain - Right/Left: Right °Pain - part of body: Leg °  °Activity Tolerance Patient tolerated treatment well °  °Patient Left in bed;with call bell/phone within reach °  °Nurse Communication Other (comment) RN states ok to work with pt., made cna aware of pt. with 2x loose stools during session and also using the 3n1 for cont. loose stools) °  ° °   ° °Time: 0841-0919 °OT Time Calculation (min): 38 min ° °Charges: OT General Charges °$OT Visit: 1 Visit °OT Treatments °$Self Care/Home Management : 38-52 mins ° °[name], [name] °Acute  Rehabilitation °[phone]  ° °M, [name] °10/28/2021, 10:14 AM °

## 2021-10-28 NOTE — Progress Notes (Signed)
Progress Note   Patient: Justin Trujillo. JJO:[id number] DOB: 1945/12/22 DOA: 10/26/2021     2 DOS: the patient was seen and examined on 10/28/2021   Brief hospital course: The patient is a 76 year old chronically ill-appearing Caucasian male with a past medical history significant for per #2 esophageal adenocarcinoma currently receiving chemotherapy, hypertension, hyperlipidemia, PAF on anticoagulation with Eliquis, diabetes mellitus type 2, OSA, GERD his other comorbidities who presents after complaints of weakness having multiple falls at home.  His wife was present at bedside and gave additional history and apparently patient had fallen twice last week on separate days but then fell yesterday evening following his chemotherapy infusion.  At that time the patient's wife was able to get him back into the bed but he report he fell early again in the morning and then she called EMS.  He reports that he had COVID back in January of this year and was treated with Paxlovid.  Since that time he has had a persistent cough and the cough has been productive sometimes he has had a hard time comprehending up.  He been treated for doxycycline with the possibility of bronchitis but overall his appetite has been poor and has not been eating much.  He complained of pain all over and approximately 3 to 4 weeks ago he discharged from oxycodone to Dilaudid for pain control.  To her knowledge patient has not had any fevers is not on oxygen at baseline.  The wife also notes that he has had high blood pressures but over the last several weeks they have been lower.  His blood pressure medications were stopped except furosemide.  She notes that he has had some lower extremity swelling and he did admit to getting choked up when eating but reports this is infrequent.  He was brought to the ED he was reported to have incontinence of stool and had vomited.  He was hypotensive on arrival and was bolused with normal saline with repeat  blood pressure improving.  Chest x-ray showed stable cardiomegaly with bilateral lower lobe opacities left side was worse on right with concerns for aspiration pneumonia.   He had a CT of the head and  cervical spine that did not show any acute fractures.  He was placed on IV Rocephin and Doxycycline and admitted for acute respiratory failure with hypoxia secondary to pneumonia as well as his cardiomyopathy.  He continues to have some significant diarrhea so we will need to work this up for infection.  C. difficile was negative and GI pathogen panel still pending.  Patient's procalcitonin level slightly elevated so we will continue antibiotics.  Continues to feel weak and did not have blood cultures on admission so obtained now.  Patient is neutropenic today and his [place] is 800.  We will need to continue monitor carefully.  Sodium and phosphorus are on the lower side so we will replete.  Assessment and Plan: * Falls- (present on admission) -PT/OT evaluated and getting IVF -Continue Supportive Care  Hypophosphatemia -Phos Level was 1.7 -Replete with IV Sodium Phos 30 mmol -Continue to Monitor and Replete as Necessary -Repeat Phos Level in the AM   Hypokalemia -K+ was 3.5 -Replete with p.o. KCl 40 mEQ -Mag Level was 1.9 -Continue monitor and treat as necessary -Repeat CMP in a.m.  Diarrhea - Has had significant amount of diarrhea so check a GI pathogen panel as well as a C. Difficile; C Difficile is Negative  -Continue to monitor carefully and if he  rules out for infectious etiology can likely start antidiarrheals   Cardiomyopathy ([place]) -Last echocardiogram noted EF of 65 to 70% with apex noted to be akinetic indicating a possible stress cardiomyopathy versus distal LAD disease with no signs of LV thrombus in 07/2021.   -BNP was noted to be 214, but no significant JVD appreciated on physical exam.  -Strict intake and output; Patient is +1.099 Liters -Daily weights -Furosemide currently  on hold due to hypotension.  -Reassess and determine when medically appropriate to restart we will continue IV fluid hydration given that he appeared volume depleted still   Hypoalbuminemia- (present on admission) -On admission albumin was noted to be low at 2.9.   -Wife notes that patient has had poor p.o. intake.  -Noted to have bilateral lower extremity edema which may be secondary to hypoalbuminemia. -Check prealbumin and was 13.7 -Glucerna and will need nutritionist evaluation  Hypotension- (present on admission) -Arrival to EMS patient was noted to have blood pressure as low as 85/40.  Blood pressure improved after receiving IV fluids.  -MAP currently maintained greater than 65 and will need to continue to monitor. -Continue to Holding diuretics at this time -IV fluids as noted above and continue maintenance IV fluids at 75 MLS per hour for now and will stop after 12 more hours -Repeat Blood Pressure this AM was 142/85 this AM  Acute respiratory failure with hypoxia (HCC)- (present on admission) -SpO2: 100 % O2 Flow Rate (L/min): 3 L/min -Continuous pulse oximetry maintain O2 saturation greater than 90% -Continue supplemental oxygen via nasal cannula and wean O2 as tolerated  -Patient will need an ambulatory home O2 screen prior to discharge and will need repeat chest x-ray in the morning  Hyponatremia- (present on admission) -Acute on chronic.  Review of records notes that patient's sodium levels have been slowly trending down currently 129 on admission.   -He had still been taking  furosemide 40 mg daily.  He has been given 500 mL bolus of IV fluids -Held furosemide -Orders placed to check urine sodium -Normal saline IV fluids at 75 mL/h we will continue -Recheck sodium levels showing improvement and now Na+ is 133  Elevated troponin- (present on admission) -High-sensitivity troponin 37->47. -Check EKG and likely this is demand ischemia  Pancytopenia due to antineoplastic  chemotherapy (HCC)- (present on admission) -On admission patient was noted to have hemoglobin 12.9 with platelet count 114 which appears similar to priors.  -Continue to monitor and repeat CBC shows a patient is pancytopenic now with a WBC of 1.0, hemoglobin/hematocrit of 9.4/28.6, and a platelet count of 121 and this is in the setting of his chemotherapy -Anemia panel done and showed an iron level of 138, UIBC 68, TIBC 207, saturation ratio 67%, ferritin level 734, folate level 55.7 and vitamin B12 level 738 -Continue with empiric antibiotics for now and repeat CBC in a.m.  Falls secondary to weakness -Patient presents with complaints of weakness after having several falls here recently.  Suspect symptoms are likely secondary to dehydration in the setting of poor p.o. intake on chemotherapy and now diarrhea.  Patient has been given IV fluids due to hypotension. -Admit medical telemetry bed -Continue to Hold diuretics -Check TSH was 0.935 -PT/OT to eval and treat and recommending home health for now but may progress to SNF if he does not improve -Transitions of care consulted for possible need of home health  History of COVID-19- (present on admission) -Patient had been treated for COVID-19 infection in January with paxlovid.  Acute respiratory failure with hypoxia secondary to pneumonia- (present on admission) -Patient presents with complaints of weakness and productive cough.   -Chest x-ray concerning for bilateral infiltrates worse on the left than on the right.  ABG noted pH 7.401, P CO2 36.3, and PaO2 of 76.  He has been placed on 3 L of nasal cannula oxygen with O2 saturations 92%.  BNP 214.5.   -He did admit to intermittently getting choked up with eating and given that he has esophageal cancer suspect he is possibly aspirating.   -SpO2: 100 % O2 Flow Rate (L/min): 3 L/min -Xopenex/Atrovent, Flutter Valve, and Incentive Spirometry  -At this time patient and wife declined need for  formal speech evaluation.   -Patient has been started on Rocephin IV. On the differential includes pulmonary edema, but seems less likely. -Aspiration precautions -Elevate head of the bed greater than 30 degrees -Continuous pulse oximetry with nasal cannula oxygen maintain O2 saturation greater than 92%. -Check procalcitonin -Incentive spirometry and flutter valve -Continue empiric antibiotics of Rocephin and added on Doxycycline IV for atypical coverage -Repeat CXR int he AM   BPH (benign prostatic hyperplasia)- (present on admission) -Continue Flomax  Paroxysmal atrial fibrillation ([place])- (present on admission) -Patient appears to be currently rate controlled. -Home medications include amiodarone 200 mg daily. -Continue amiodarone and anticoagulation with Eliquis -Continue telemetry monitoring  Malignant neoplasm of lower third of esophagus ([place])- (present on admission) -Patient is currently undergoing chemotherapy last infusion 2/8 and followed by Dr. Lorenso Courier of oncology. -Dr. Lorenso Courier added to the treatment team via epic and will need to discuss with them if his ANC drops below 500 about whether to use Granix or not -Continue Dilaudid as needed for pain  COPD (chronic obstructive pulmonary disease); chronic bronchitis- (present on admission) -Patient has some mild expiratory wheezes intermittently on physical exam. -Breathing treatments as needed -If symptoms persist may warrant steroids -Flutter Valve, Incentive Spirometry, and Guaifenesin 1200 mg po BID -Start Xopenex/Atrovent  -Continue to Monitor Respiratory Status carefully   Controlled type 2 diabetes mellitus with complication, without long-term current use of insulin ([place])- (present on admission) -On admission glucose elevated up to 178.   -Last available hemoglobin A1c was 5.9 06/2021.  Home medications include Ozempic. -Hypoglycemic protocols -CBGs before every meal with very sensitive SSI -Adjust regimen as  needed -CBGs ranging from 101-174   Subjective: Seen and examined at bedside and was doing little bit better.  Was not as short of breath today.  Still coughing some sputum.  Still also having diarrhea and had 3 bowel movements already.  No lightheadedness or dizziness.  No other concerns or complaints at this time.  Physical Exam: Vitals:   10/28/21 0759 10/28/21 0800 10/28/21 1117 10/28/21 1326  BP:  (!) 158/89 (!) 142/85   Pulse:  92 86   Resp:  16 17   Temp: 97.9 F (36.6 C) 97.9 F (36.6 C) 98.3 F (36.8 C)   TempSrc: Oral Oral Oral   SpO2:  99% 97% 100%  Weight:      Height:       Examination: Physical Exam:  Constitutional: WN/WD overweight Caucasian male currently in NAD and appears calm and comfortable Respiratory: Diminished to auscultation bilaterally with coarse, no wheezing, rales, rhonchi or crackles. Normal respiratory effort and patient is not tachypenic. No accessory muscle use.  Unlabored breathing and not on supplemental oxygen via nasal cannula Cardiovascular: RRR, no murmurs / rubs / gallops. S1 and S2 auscultated.  1+ lower extremity Abdomen:  Soft, non-tender, distended secondary to body habitus.   No masses palpated. No appreciable hepatosplenomegaly. Bowel sounds positive.  GU: Deferred.  Data Reviewed:  I have independently interpreted and reviewed the patient's CMP, iron panel, procalcitonin level, and CBC  Patient's sodium is now 133, phosphorus level is 1.7, anemia panel done.  And is now pancytopenic  Family Communication: Discussed with wife at bedside  Disposition: Status is: Inpatient Remains inpatient appropriate because: Continues to have diarrhea is now pancytopenic.  Remains on antibiotics procalcitonin level is now 1.75  Planned Discharge Destination: Home with Home Health vs SNF  DVT Prophylaxis: Anticoagulated with Apixaban  Author: Raiford Noble, DO [facility] 10/28/2021 5:32 PM  For on call review [email or web address].

## 2021-10-28 NOTE — Progress Notes (Addendum)
Date and time results received: 10/28/21 0750   Test: WBC Critical Value: 1.0  Name of Provider Notified: Alfredia Ferguson MD

## 2021-10-28 NOTE — Assessment & Plan Note (Addendum)
-  Phos Level was 2.1 -Replete with IV Sodium Phos 30 mmol again today -Continue to Monitor and Replete as Necessary -Repeat Phos Level in the AM

## 2021-10-29 ENCOUNTER — Inpatient Hospital Stay (HOSPITAL_COMMUNITY): Payer: PPO

## 2021-10-29 ENCOUNTER — Other Ambulatory Visit: Payer: Self-pay | Admitting: Hematology and Oncology

## 2021-10-29 LAB — CBC WITH DIFFERENTIAL/PLATELET
Abs Immature Granulocytes: 0 10*3/uL (ref 0.00–0.07)
Basophils Absolute: 0 10*3/uL (ref 0.0–0.1)
Basophils Relative: 0 %
Eosinophils Absolute: 0 10*3/uL (ref 0.0–0.5)
Eosinophils Relative: 5 %
HCT: 28.1 % — ABNORMAL LOW (ref 39.0–52.0)
Hemoglobin: 9.4 g/dL — ABNORMAL LOW (ref 13.0–17.0)
Lymphocytes Relative: 21 %
Lymphs Abs: 0.2 10*3/uL — ABNORMAL LOW (ref 0.7–4.0)
MCH: 33.3 pg (ref 26.0–34.0)
MCHC: 33.5 g/dL (ref 30.0–36.0)
MCV: 99.6 fL (ref 80.0–100.0)
Monocytes Absolute: 0 10*3/uL — ABNORMAL LOW (ref 0.1–1.0)
Monocytes Relative: 2 %
Neutro Abs: 0.6 10*3/uL — ABNORMAL LOW (ref 1.7–7.7)
Neutrophils Relative %: 72 %
Platelets: 85 10*3/uL — ABNORMAL LOW (ref 150–400)
RBC: 2.82 MIL/uL — ABNORMAL LOW (ref 4.22–5.81)
RDW: 16.8 % — ABNORMAL HIGH (ref 11.5–15.5)
WBC: 0.8 10*3/uL — CL (ref 4.0–10.5)
nRBC: 0 % (ref 0.0–0.2)
nRBC: 0 /100 WBC

## 2021-10-29 LAB — COMPREHENSIVE METABOLIC PANEL
ALT: 21 U/L (ref 0–44)
AST: 21 U/L (ref 15–41)
Albumin: 2.3 g/dL — ABNORMAL LOW (ref 3.5–5.0)
Alkaline Phosphatase: 59 U/L (ref 38–126)
Anion gap: 6 (ref 5–15)
BUN: 7 mg/dL — ABNORMAL LOW (ref 8–23)
CO2: 22 mmol/L (ref 22–32)
Calcium: 7.9 mg/dL — ABNORMAL LOW (ref 8.9–10.3)
Chloride: 103 mmol/L (ref 98–111)
Creatinine, Ser: 0.66 mg/dL (ref 0.61–1.24)
GFR, Estimated: >60 mL/min (ref >60)
Glucose, Bld: 105 mg/dL — ABNORMAL HIGH (ref 70–99)
Potassium: 3.8 mmol/L (ref 3.5–5.1)
Sodium: 131 mmol/L — ABNORMAL LOW (ref 135–145)
Total Bilirubin: 0.4 mg/dL (ref 0.3–1.2)
Total Protein: 4.9 g/dL — ABNORMAL LOW (ref 6.5–8.1)

## 2021-10-29 LAB — GLUCOSE, CAPILLARY
Glucose-Capillary: 105 mg/dL — ABNORMAL HIGH (ref 70–99)
Glucose-Capillary: 128 mg/dL — ABNORMAL HIGH (ref 70–99)
Glucose-Capillary: 111 mg/dL — ABNORMAL HIGH (ref 70–99)
Glucose-Capillary: 140 mg/dL — ABNORMAL HIGH (ref 70–99)

## 2021-10-29 LAB — MAGNESIUM: Magnesium: 1.6 mg/dL — ABNORMAL LOW (ref 1.7–2.4)

## 2021-10-29 LAB — PHOSPHORUS: Phosphorus: 2 mg/dL — ABNORMAL LOW (ref 2.5–4.6)

## 2021-10-29 MED ORDER — MELATONIN 3 MG PO TABS
3.0000 mg | ORAL_TABLET | Freq: Every evening | ORAL | Status: DC | PRN
  Administered 2021-10-29: 3 mg via ORAL
  Filled 2021-10-29: qty 1

## 2021-10-29 MED ORDER — POTASSIUM PHOSPHATES 15 MMOLE/5ML IV SOLN
20.0000 mmol | Freq: Once | INTRAVENOUS | Status: AC
  Administered 2021-10-29: 20 mmol via INTRAVENOUS
  Filled 2021-10-29: qty 6.67

## 2021-10-29 MED ORDER — TBO-FILGRASTIM 300 MCG/0.5ML ~~LOC~~ SOSY
300.0000 ug | PREFILLED_SYRINGE | Freq: Every day | SUBCUTANEOUS | Status: DC
  Administered 2021-10-29: 300 ug via SUBCUTANEOUS
  Filled 2021-10-29 (×2): qty 0.5

## 2021-10-29 MED ORDER — GERHARDT'S BUTT CREAM
TOPICAL_CREAM | CUTANEOUS | Status: DC | PRN
  Administered 2021-10-29: 1 via TOPICAL
  Filled 2021-10-29: qty 1

## 2021-10-29 MED ORDER — MAGNESIUM SULFATE 2 GM/50ML IV SOLN
2.0000 g | Freq: Once | INTRAVENOUS | Status: AC
  Administered 2021-10-29: 2 g via INTRAVENOUS
  Filled 2021-10-29: qty 50

## 2021-10-29 MED ORDER — TBO-FILGRASTIM 480 MCG/0.8ML ~~LOC~~ SOSY: 480.0000 ug | PREFILLED_SYRINGE | Freq: Every day | SUBCUTANEOUS | Status: DC

## 2021-10-29 NOTE — Progress Notes (Signed)
Progress Note   Patient: Justin Trujillo. YOV:[id number] DOB: 04-22-1946 DOA: 10/26/2021     3 DOS: the patient was seen and examined on 10/29/2021   Brief hospital course: The patient is a 76 year old chronically ill-appearing Caucasian male with a past medical history significant for per #2 esophageal adenocarcinoma currently receiving chemotherapy, hypertension, hyperlipidemia, PAF on anticoagulation with Eliquis, diabetes mellitus type 2, OSA, GERD his other comorbidities who presents after complaints of weakness having multiple falls at home.  His wife was present at bedside and gave additional history and apparently patient had fallen twice last week on separate days but then fell yesterday evening following his chemotherapy infusion.  At that time the patient's wife was able to get him back into the bed but he report he fell early again in the morning and then she called EMS.  He reports that he had COVID back in January of this year and was treated with Paxlovid.  Since that time he has had a persistent cough and the cough has been productive sometimes he has had a hard time comprehending up.  He been treated for doxycycline with the possibility of bronchitis but overall his appetite has been poor and has not been eating much.  He complained of pain all over and approximately 3 to 4 weeks ago he discharged from oxycodone to Dilaudid for pain control.  To her knowledge patient has not had any fevers is not on oxygen at baseline.  The wife also notes that he has had high blood pressures but over the last several weeks they have been lower.  His blood pressure medications were stopped except furosemide.  She notes that he has had some lower extremity swelling and he did admit to getting choked up when eating but reports this is infrequent.  He was brought to the ED he was reported to have incontinence of stool and had vomited.  He was hypotensive on arrival and was bolused with normal saline with repeat  blood pressure improving.  Chest x-ray showed stable cardiomegaly with bilateral lower lobe opacities left side was worse on right with concerns for aspiration pneumonia.   He had a CT of the head and  cervical spine that did not show any acute fractures.  He was placed on IV Rocephin and Doxycycline and admitted for acute respiratory failure with hypoxia secondary to pneumonia as well as his cardiomyopathy.  He continues to have some significant diarrhea so we will need to work this up for infection.  C. difficile was negative and GI pathogen panel still pending.  Patient's procalcitonin level slightly elevated so we will continue antibiotics.  Continues to feel weak and did not have blood cultures on admission so obtained now.  Patient is neutropenic today and his Dickens is 800.  We will need to continue monitor carefully.  Sodium and phosphorus are on the lower side so we will replete.  Assessment and Plan: * Falls- (present on admission) -PT/OT evaluated and IVF now stopped -Continue Supportive Care  Hypophosphatemia -Phos Level was 2.0 -Replete with IV Sodium Phos 30 mmol again -Continue to Monitor and Replete as Necessary -Repeat Phos Level in the AM   Hypokalemia -K+ was 3.8 -Replete with p.o. KCl 40 mEQ -Mag Level was 1.6 so will replete with IV Mag Sulfate 2 grams -Continue monitor and treat as necessary -Repeat CMP in a.m.  Diarrhea - Has had significant amount of diarrhea so check a GI pathogen panel as well as a C. Difficile; C  Difficile is Negative and GI Pathogen Panel Negative -Continue to monitor carefully and if he rules out for infectious etiology can likely start antidiarrheals but has not had any Diarrhea   Cardiomyopathy ([place]) -Last echocardiogram noted EF of 65 to 70% with apex noted to be akinetic indicating a possible stress cardiomyopathy versus distal LAD disease with no signs of LV thrombus in 07/2021.   -BNP was noted to be 214, but no significant JVD  appreciated on physical exam.  -Strict intake and output; Patient is +850.4 Liters -Daily weights -Furosemide currently on hold due to hypotension and may resume in the AM  -He is appearing more Euvolemic    Hypoalbuminemia- (present on admission) -On admission albumin was noted to be low at 2.9 and is now 2.3 -Wife notes that patient has had poor p.o. intake.  -Noted to have bilateral lower extremity edema which may be secondary to hypoalbuminemia. -Check prealbumin and was 13.7 -Glucerna and will need nutritionist evaluation  Hypotension- (present on admission) -Arrival to EMS patient was noted to have blood pressure as low as 85/40.  Blood pressure improved after receiving IV fluids.  -MAP currently maintained greater than 65 and will need to continue to monitor. -Continue to Holding diuretics at this time and consider resuming in the AM  -IV fluids now stopped -Repeat Blood Pressure this AM was 151/91 this AM  Acute respiratory failure with hypoxia ([place])- (present on admission) -SpO2: 100 % O2 Flow Rate (L/min): 3 L/min -Continuous pulse oximetry maintain O2 saturation greater than 90% -Continue supplemental oxygen via nasal cannula and wean O2 as tolerated  -Patient will need an ambulatory home O2 screen prior to discharge and will need repeat chest x-ray in the morning  Hyponatremia- (present on admission) -Acute on chronic.  Review of records notes that patient's sodium levels have been slowly trending down currently 129 on admission.   -He had still been taking  furosemide 40 mg daily.  He has been given 500 mL bolus of IV fluids -Held furosemide -Orders placed to check urine sodium -Normal saline IV fluids at 75 mL/h now stopped so will replete with IV Sodium Phos -Recheck sodium levels showing improvement and now Na+ is 131  Elevated troponin- (present on admission) -High-sensitivity troponin 37->47. -Check EKG and likely this is demand ischemia  Pancytopenia due to  antineoplastic chemotherapy ([place])- (present on admission) -On admission patient was noted to have hemoglobin 12.9 with platelet count 114 which appears similar to priors.  -Continue to monitor and repeat CBC shows a patient is pancytopenic now with a WBC of 0.8, hemoglobin/hematocrit of 9.4/28.1, and a platelet count of 85 and this is in the setting of his chemotherapy -Discussed with Dr. Lindi Adie about initiating Granix and will start 300 mg sq Daily given ANC of 600 -Checking Blood Cx x2 and pending -Repeat CXR showed "The appearance the chest again suggests multilobar bilateral bronchopneumonia, with similar aeration compared to the prior examination. Postoperative changes and support apparatus, as above." -Anemia panel done and showed an iron level of 138, UIBC 68, TIBC 207, saturation ratio 67%, ferritin level 734, folate level 55.7 and vitamin B12 level 738 -Continue with empiric antibiotics for now and repeat CBC in a.m.  Falls secondary to weakness -Patient presents with complaints of weakness after having several falls here recently.  Suspect symptoms are likely secondary to dehydration in the setting of poor p.o. intake on chemotherapy and now diarrhea.  Patient has been given IV fluids due to hypotension. -Admit medical telemetry  bed -Continue to Hold diuretics -Check TSH was 0.935 -PT/OT to eval and treat and recommending home health for now but may progress to SNF if he does not improve -Transitions of care consulted for possible need of home health  History of COVID-19- (present on admission) -Patient had been treated for COVID-19 infection in January with paxlovid.  Acute respiratory failure with hypoxia secondary to pneumonia- (present on admission) -Patient presents with complaints of weakness and productive cough.   -Chest x-ray concerning for bilateral infiltrates worse on the left than on the right.  ABG noted pH 7.401, P CO2 36.3, and PaO2 of 76.  He has been placed on 3 L of  nasal cannula oxygen with O2 saturations 92%.  BNP 214.5.   -He did admit to intermittently getting choked up with eating and given that he has esophageal cancer suspect he is possibly aspirating.   -SpO2: 100 % O2 Flow Rate (L/min): 3 L/min; Or has been weaned -Xopenex/Atrovent, Flutter Valve, and Incentive Spirometry  -At this time patient and wife declined need for formal speech evaluation.   -Patient has been started on Rocephin IV. On the differential includes pulmonary edema, but seems less likely. -Aspiration precautions -Elevate head of the bed greater than 30 degrees -Continuous pulse oximetry with nasal cannula oxygen maintain O2 saturation greater than 92%. -Check procalcitonin -Incentive spirometry and flutter valve -Continue empiric antibiotics of Rocephin and added on Doxycycline IV for atypical coverage -Repeat CXR int he AM as repeat CXR here showed "The appearance the chest again suggests multilobar bilateral bronchopneumonia, with similar aeration compared to the prior examination. Postoperative changes and support apparatus, as above."  BPH (benign prostatic hyperplasia)- (present on admission) -Continue Flomax  Paroxysmal atrial fibrillation ([place])- (present on admission) -Patient appears to be currently rate controlled. -Home medications include amiodarone 200 mg daily. -Continue amiodarone and anticoagulation with Eliquis -Continue telemetry monitoring  Malignant neoplasm of lower third of esophagus ([place])- (present on admission) -Patient is currently undergoing chemotherapy last infusion 2/8 and followed by Dr. Lorenso Courier of oncology. -Dr. Lorenso Courier added to the treatment team via epic and after discussion with Dr. Lindi Adie today will add Granix -Continue Dilaudid as needed for pain  COPD (chronic obstructive pulmonary disease); chronic bronchitis- (present on admission) -Patient has some mild expiratory wheezes intermittently on physical exam. -Breathing treatments as  needed -If symptoms persist may warrant steroids -Flutter Valve, Incentive Spirometry, and Guaifenesin 1200 mg po BID -Start Xopenex/Atrovent  -SpO2: 96 % O2 Flow Rate (L/min): 3 L/min -Repeat CXR in the AM  -Continue to Monitor Respiratory Status carefully   Controlled type 2 diabetes mellitus with complication, without long-term current use of insulin ([place])- (present on admission) -On admission glucose elevated up to 178.   -Last available hemoglobin A1c was 5.9 06/2021.  Home medications include Ozempic. -Hypoglycemic protocols -CBGs before every meal with very sensitive SSI -Adjust regimen as needed -CBGs ranging from 105-174  Subjective: Seen and examined at bedside and he was doing okay.  Not as short of breath but still coughing.  I spoke with him about his Steele being low and told him that he would get Granix tonight.  He had no other concerns or complaints and feels okay.  Physical Exam: Vitals:   10/28/21 2033 10/29/21 0430 10/29/21 0846 10/29/21 1034  BP:  (!) 176/91  (!) 151/91  Pulse:  84  84  Resp:  18  14  Temp:  98 F (36.7 C)  97.9 F (36.6 C)  TempSrc:  Oral  Oral  SpO2: 100% 98% 94% 96%  Weight:  70.5 kg    Height:       Examination: Physical Exam:  Constitutional: WN/WD overweight chronically ill-appearing Caucasian male currently in NAD and appears calm and comfortable Respiratory: Diminished to auscultation bilaterally with coarse breath sounds and some rhonchi, no wheezing, rales. Normal respiratory effort and patient is not tachypenic. No accessory muscle use.  Not wearing supplemental oxygen via nasal cannula Cardiovascular: RRR, no murmurs / rubs / gallops. S1 and S2 auscultated.  Has mild 1+ extremity edema Abdomen: Soft, non-tender, distended secondary body habitus. No masses palpated. No appreciable hepatosplenomegaly. Bowel sounds positive.  GU: Deferred.  Data Reviewed:  I have personally reviewed and interpreted the patient's clinical  laboratory data including his CMP and CBC  CBC shows patient is pancytopenic and his ANC is 600.  He has a slight hypomagnesemia, hypophosphatemia, and is hyponatremic  Family Communication: No family present at bedside   Disposition: Status is: Inpatient Remains inpatient appropriate because: Still has a PNA and ANC is <1500  Planned Discharge Destination: Skilled nursing facility vs Home Health  DVT Prophylaxis: Apixaban 5 mg po BID   Author: Raiford Noble, DO [facility] 10/29/2021 3:21 PM  For on call review [email or web address].

## 2021-10-30 ENCOUNTER — Inpatient Hospital Stay (HOSPITAL_COMMUNITY): Payer: PPO

## 2021-10-30 DIAGNOSIS — I9589 Other hypotension: Secondary | ICD-10-CM

## 2021-10-30 DIAGNOSIS — I428 Other cardiomyopathies: Secondary | ICD-10-CM

## 2021-10-30 LAB — COMPREHENSIVE METABOLIC PANEL
ALT: 19 U/L (ref 0–44)
AST: 22 U/L (ref 15–41)
Albumin: 2.2 g/dL — ABNORMAL LOW (ref 3.5–5.0)
Alkaline Phosphatase: 63 U/L (ref 38–126)
Anion gap: 6 (ref 5–15)
BUN: 8 mg/dL (ref 8–23)
CO2: 24 mmol/L (ref 22–32)
Calcium: 7.9 mg/dL — ABNORMAL LOW (ref 8.9–10.3)
Chloride: 101 mmol/L (ref 98–111)
Creatinine, Ser: 0.79 mg/dL (ref 0.61–1.24)
GFR, Estimated: >60 mL/min (ref >60)
Glucose, Bld: 99 mg/dL (ref 70–99)
Potassium: 3.9 mmol/L (ref 3.5–5.1)
Sodium: 131 mmol/L — ABNORMAL LOW (ref 135–145)
Total Bilirubin: 0.5 mg/dL (ref 0.3–1.2)
Total Protein: 4.8 g/dL — ABNORMAL LOW (ref 6.5–8.1)

## 2021-10-30 LAB — GLUCOSE, CAPILLARY
Glucose-Capillary: 92 mg/dL (ref 70–99)
Glucose-Capillary: 101 mg/dL — ABNORMAL HIGH (ref 70–99)
Glucose-Capillary: 95 mg/dL (ref 70–99)

## 2021-10-30 LAB — CBC WITH DIFFERENTIAL/PLATELET
Abs Immature Granulocytes: 0 10*3/uL (ref 0.00–0.07)
Basophils Absolute: 0 10*3/uL (ref 0.0–0.1)
Basophils Relative: 2 %
Eosinophils Absolute: 0 10*3/uL (ref 0.0–0.5)
Eosinophils Relative: 1 %
HCT: 28 % — ABNORMAL LOW (ref 39.0–52.0)
Hemoglobin: 9.2 g/dL — ABNORMAL LOW (ref 13.0–17.0)
Lymphocytes Relative: 23 %
Lymphs Abs: 0.4 10*3/uL — ABNORMAL LOW (ref 0.7–4.0)
MCH: 32.9 pg (ref 26.0–34.0)
MCHC: 32.9 g/dL (ref 30.0–36.0)
MCV: 100 fL (ref 80.0–100.0)
Monocytes Absolute: 0.1 10*3/uL (ref 0.1–1.0)
Monocytes Relative: 5 %
Neutro Abs: 1.2 10*3/uL — ABNORMAL LOW (ref 1.7–7.7)
Neutrophils Relative %: 69 %
Platelets: 97 10*3/uL — ABNORMAL LOW (ref 150–400)
RBC: 2.8 MIL/uL — ABNORMAL LOW (ref 4.22–5.81)
RDW: 16.3 % — ABNORMAL HIGH (ref 11.5–15.5)
WBC: 1.7 10*3/uL — ABNORMAL LOW (ref 4.0–10.5)
nRBC: 0 % (ref 0.0–0.2)
nRBC: 0 /100 WBC

## 2021-10-30 LAB — PHOSPHORUS: Phosphorus: 2.1 mg/dL — ABNORMAL LOW (ref 2.5–4.6)

## 2021-10-30 LAB — MAGNESIUM: Magnesium: 1.7 mg/dL (ref 1.7–2.4)

## 2021-10-30 MED ORDER — MAGNESIUM SULFATE 2 GM/50ML IV SOLN
2.0000 g | Freq: Once | INTRAVENOUS | Status: AC
  Administered 2021-10-30: 2 g via INTRAVENOUS
  Filled 2021-10-30: qty 50

## 2021-10-30 MED ORDER — GUAIFENESIN ER 600 MG PO TB12: 600.0000 mg | ORAL_TABLET | Freq: Two times a day (BID) | ORAL | 0 refills | Status: AC

## 2021-10-30 MED ORDER — GLUCERNA SHAKE PO LIQD: 237.0000 mL | Freq: Three times a day (TID) | ORAL | 0 refills | Status: AC

## 2021-10-30 MED ORDER — SODIUM PHOSPHATES 45 MMOLE/15ML IV SOLN
30.0000 mmol | Freq: Once | INTRAVENOUS | Status: AC
  Administered 2021-10-30: 30 mmol via INTRAVENOUS
  Filled 2021-10-30: qty 10

## 2021-10-30 MED ORDER — SALINE SPRAY 0.65 % NA SOLN: 1.0000 | NASAL | 0 refills | Status: AC | PRN

## 2021-10-30 MED ORDER — CEFUROXIME AXETIL 500 MG PO TABS: 500.0000 mg | ORAL_TABLET | Freq: Two times a day (BID) | ORAL | 0 refills | Status: AC

## 2021-10-30 MED ORDER — MAGNESIUM OXIDE -MG SUPPLEMENT 400 (240 MG) MG PO TABS: 400.0000 mg | ORAL_TABLET | Freq: Every day | ORAL | 0 refills | Status: AC

## 2021-10-30 MED ORDER — ONDANSETRON HCL 4 MG PO TABS: 4.0000 mg | ORAL_TABLET | Freq: Four times a day (QID) | ORAL | 0 refills | Status: AC | PRN

## 2021-10-30 MED ORDER — ACETAMINOPHEN 325 MG PO TABS: 650.0000 mg | ORAL_TABLET | Freq: Four times a day (QID) | ORAL | 0 refills | Status: AC | PRN

## 2021-10-30 MED ORDER — HEPARIN SOD (PORK) LOCK FLUSH 100 UNIT/ML IV SOLN
500.0000 [IU] | INTRAVENOUS | Status: AC | PRN
  Administered 2021-10-30: 500 [IU]
  Filled 2021-10-30: qty 5

## 2021-10-30 MED ORDER — DOXYCYCLINE HYCLATE 100 MG PO TABS: 100.0000 mg | ORAL_TABLET | Freq: Two times a day (BID) | ORAL | 0 refills | Status: AC

## 2021-10-30 NOTE — Progress Notes (Signed)
Mobility Specialist Progress Note:   10/30/21 1330  Mobility  Activity Ambulated with assistance in hallway  Level of Assistance Contact guard assist, steadying assist  Assistive Device Front wheel walker  Distance Ambulated (ft) 200 ft  Activity Response Tolerated well  $Mobility charge 1 Mobility   Pt agreeable to mobility this afternoon. Received on RA, SpO2 96% at rest. Pt desat to 89% during ambulation on RA, no overt SOB noted. Pt back in bed with all needs met. Ambulatory sat note to follow.  Nelta Numbers [facility] Phone: [phone] Office Phone: [phone]

## 2021-10-30 NOTE — Care Management Important Message (Signed)
Important Message  Patient Details  Name: Justin Trujillo. MRN: [id number] Date of Birth: 08/28/1946   Medicare Important Message Given:  Yes     Shelda Altes 10/30/2021, 8:59 AM

## 2021-10-30 NOTE — Progress Notes (Signed)
Patient is still receiving sodium phosphate through chest port and needs to receive one dose of IV rocephin prior to discharge. Patient made aware. Med details done and AVS papers printed, located at nurses station. Port of cath needs to be de-access prior to discharge. Oncoming RN made aware.

## 2021-10-30 NOTE — TOC Initial Note (Signed)
Transition of Care (TOC) - Initial/Assessment Note    Patient Details  Name: Justin Trujillo. MRN: [id number] Date of Birth: 07-29-1946  Transition of Care [facility]) CM/SW Contact:    Zenon Mayo, RN Phone Number: 10/30/2021, 3:59 PM  Clinical Narrative:                 NCM spoke with patient at the bedside, he gave this NCM permission to call his wife.  NCM spoke with wife, she states they have support at home, her daughter and her family live up stairs and they help them.  She states he has rollator, bsc, grab bars in the shower, scale, bp cuff, and pulse oximetry. She takes him to [facility] apts right now.  Follow up apt made with PCP, listed on AVS. NCM offered choice for [facility], wife states they are active with [facility] for [place], [place] and would like to continue with them.  NCM made Anderson Malta with [facility] aware. Will need HH orders.   TOC will continue to follow for dc needs.   Expected Discharge Plan: [place] Barriers to Discharge: Continued Medical Work up   Patient Goals and CMS Choice Patient states their goals for this hospitalization and ongoing recovery are:: return home CMS Medicare.gov Compare Post Acute Care list provided to:: Patient Represenative (must comment) Choice offered to / list presented to : Spouse  Expected Discharge Plan and Services Expected Discharge Plan: [place]   Discharge Planning Services: CM Consult Post Acute Care Choice: Home Health, Resumption of Svcs/PTA Provider Living arrangements for the past 2 months: Single Family Home                   DME Agency: NA       HH Arranged: PT, OT HH Agency: [facility] Date [place]: 10/30/21 Time [place]: 54 Representative spoke with at [place]: Freddie Breech  Prior Living Arrangements/Services Living arrangements for the past 2 months: [place] Lives with:: Spouse, Adult Children Patient language and need for  interpreter reviewed:: Yes Do you feel safe going back to the place where you live?: Yes      Need for Family Participation in Patient Care: Yes (Comment)   Current home services: Home OT, Home PT, DME (rollator,bsc, grab bars in shower, scale, bp cuff, pulse oxyimetry) Criminal Activity/Legal Involvement Pertinent to Current Situation/Hospitalization: No - Comment as needed  Activities of Daily Living      Permission Sought/Granted                  Emotional Assessment Appearance:: Appears stated age Attitude/Demeanor/Rapport: Engaged Affect (typically observed): Appropriate Orientation: : Oriented to Place, Oriented to  Time, Oriented to Situation, Oriented to Self Alcohol / Substance Use: Not Applicable Psych Involvement: No (comment)  Admission diagnosis:  Hyponatremia [E87.1] Trauma [T14.90XA] Hypoxia [R09.02] Falls [W19.XXXA] Hypervolemia, unspecified hypervolemia type [E87.70] Community acquired pneumonia, unspecified laterality [J18.9] Cough, unspecified type [R05.9] Patient Active Problem List   Diagnosis Date Noted   Hypophosphatemia 10/28/2021   Diarrhea 10/27/2021   Hypokalemia 10/27/2021   Falls 10/26/2021   BPH (benign prostatic hyperplasia) 10/26/2021   Acute respiratory failure with hypoxia secondary to pneumonia 10/26/2021   History of COVID-19 10/26/2021   Falls secondary to weakness 10/26/2021   Pancytopenia due to antineoplastic chemotherapy ([place]) 10/26/2021   Elevated troponin 10/26/2021   Hyponatremia 10/26/2021   Acute respiratory failure with hypoxia ([place]) 10/26/2021  Hypotension 10/26/2021   Hypoalbuminemia 10/26/2021   Cardiomyopathy ([place]) 10/26/2021   Paroxysmal atrial fibrillation ([place]) 08/01/2021   Chemotherapy induced nausea and vomiting 07/16/2021   Vomiting without nausea    AKI (acute kidney injury) ([place]) 07/15/2021   Intractable nausea and vomiting 07/15/2021   Dehydration 00/17/4944   Metabolic acidosis, increased anion gap  07/15/2021   Port-A-Cath in place 06/07/2021   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[place]) 05/10/2021   Malignant neoplasm of lower third of esophagus ([place]) 03/07/2021   S/P reverse total shoulder arthroplasty, 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   Lumbar stenosis with neurogenic claudication 10/28/2018   DM type 2 (diabetes mellitus, type 2) ([place]) 01/15/2012   CAD (coronary artery disease) 01/15/2012   HTN (hypertension) 01/15/2012   Dyslipidemia 01/15/2012   COPD (chronic obstructive pulmonary disease); chronic bronchitis 01/15/2012   Tobacco abuse 01/15/2012   BMI 40.0-44.9, adult ([place]) 01/15/2012   OSA (obstructive sleep apnea) 01/15/2012   Insomnia 01/15/2012   IBS (irritable bowel syndrome) 01/15/2012   Hypogonadism male 01/15/2012   DJD (degenerative joint disease) 01/15/2012   Rotator cuff arthropathy, left 01/15/2012   GERD (gastroesophageal reflux disease) 01/15/2012   CHEST PAIN, ATYPICAL 07/12/2010   TOBACCO USER 10/20/2009   CAD, NATIVE VESSEL 09/29/2009   EMPHYSEMA 09/21/2009   Controlled type 2 diabetes mellitus with complication, without long-term current use of insulin ([place]) 08/31/2009   HYPERLIPIDEMIA 08/31/2009   OBSTRUCTIVE SLEEP APNEA 08/31/2009   Essential hypertension 08/31/2009   CHRONIC OBSTRUCTIVE PULMONARY DISEASE, ACUTE EXACERBATION 08/31/2009   DYSPNEA 08/31/2009   PCP:  Carol Ada, MD Pharmacy:   [facility] [name] [address] Phone: [phone] Fax: [phone]     Social Determinants of Health (SDOH) Interventions    Readmission Risk Interventions Readmission Risk Prevention Plan 10/30/2021  Transportation Screening Complete  Medication Review (RN Care Manager) Complete  PCP or Specialist appointment within 3-5 days of discharge Complete  HRI or [place] Complete  SW Recovery Care/Counseling Consult Complete  Palliative Care Screening Not [place] Not Applicable  Some recent data might be hidden

## 2021-10-30 NOTE — Progress Notes (Signed)
Physical Therapy Treatment Patient Details Name: Justin Trujillo. MRN: [id number] DOB: 06-16-1946 Today's Date: 10/30/2021   History of Present Illness Justin Flight. is a 76 y.o. male admitted 10/26/21 presents with complaints of weakness after having having falls at home. In route with EMS he was reported to have incontinence of stool and vomited.  He was hypotensive 85/40. Chest x-ray noted stable cardiomegaly with bilateral lower lobe opacities left worse than the right concerning for possible aspiration pneumonia.  CT scan of the head and cervical spine did not note any acute fractures PMH significant for esophageal adenocarcinoma currently receiving chemotherapy, hypertension, hyperlipidemia, PAF on Eliquis, DM type II, OSA, COVID (Jan 2023) and GERD.    PT Comments    The pt was able to make good progress with hallway ambulation since initial PT evaluation. He completed 200 ft hallway ambulation with use of RW on RA with no overt LOB or SpO2 below 93%. The pt also completed navigation of x2 stairs with use of single rail to mimic home environment, but reports he is limited by pain at this time. The pt was encouraged to complete progressive walking program at home, and will continue to benefit from skilled PT for balance intervention to reduce incidence of further falls. The pt will be safe to return home with assist from family as needed.   Gait Speed: 0.52m/s using RW and with RA. (Gait speed <0.33m/s indicates increased risk of falls and dependence in ADLs)    Recommendations for follow up therapy are one component of a multi-disciplinary discharge planning process, led by the attending physician.  Recommendations may be updated based on patient status, additional functional criteria and insurance authorization.  Follow Up Recommendations  Home health PT     Assistance Recommended at Discharge Frequent or constant Supervision/Assistance  Patient can return home with the following A  little help with walking and/or transfers;A little help with bathing/dressing/bathroom;Assistance with cooking/housework;Help with stairs or ramp for entrance;Assist for transportation   Equipment Recommendations  None recommended by PT    Recommendations for Other Services       Precautions / Restrictions Precautions Precautions: Fall Precaution Comments: has had multiple falls at home Restrictions Weight Bearing Restrictions: No     Mobility  Bed Mobility Overal bed mobility: Needs Assistance Bed Mobility: Supine to Sit     Supine to sit: Supervision     General bed mobility comments: pt able to complete without assist. increased time    Transfers Overall transfer level: Needs assistance Equipment used: Rolling walker (2 wheels) Transfers: Sit to/from Stand Sit to Stand: Min guard           General transfer comment: minG to power up with increased time. benefits from cues for hand placement    Ambulation/Gait Ambulation/Gait assistance: Min guard Gait Distance (Feet): 200 Feet Assistive device: Rolling walker (2 wheels) Gait Pattern/deviations: Step-through pattern, Shuffle, Trunk flexed Gait velocity: 0.31 m/s Gait velocity interpretation: <1.31 ft/sec, indicative of household ambulator   General Gait Details: pt with trunk flexed (reports due to back pain) but was able to maintain with good stability. slow but steady.   Stairs Stairs: Yes Stairs assistance: Min guard Stair Management: One rail Left, Step to pattern, Forwards Number of Stairs: 2 General stair comments: BUE support on L rail. minG for safety, VSS on RA       Balance Overall balance assessment: Needs assistance Sitting-balance support: No upper extremity supported, Feet supported Sitting balance-Leahy Scale: Fair  Standing balance support: Bilateral upper extremity supported, During functional activity Standing balance-Leahy Scale: Fair Standing balance comment: Reliant on BUE  support                            Cognition Arousal/Alertness: Awake/alert Behavior During Therapy: WFL for tasks assessed/performed Overall Cognitive Status: Within Functional Limits for tasks assessed Area of Impairment: Problem solving                             Problem Solving: Slow processing General Comments: able to follow all cues, self-monitor for exertion        Exercises      General Comments General comments (skin integrity, edema, etc.): VSS on RA with all activity      Pertinent Vitals/Pain Pain Assessment Pain Assessment: Faces Faces Pain Scale: Hurts even more Pain Location: R knee and chronic low back Pain Descriptors / Indicators: Cramping, Guarding, Grimacing, Discomfort Pain Intervention(s): Limited activity within patient's tolerance, Monitored during session, Premedicated before session, Repositioned     PT Goals (current goals can now be found in the care plan section) Acute Rehab PT Goals Patient Stated Goal: to go home PT Goal Formulation: With patient Time For Goal Achievement: 11/10/21 Potential to Achieve Goals: Good Progress towards PT goals: Progressing toward goals    Frequency    Min 3X/week      PT Plan Current plan remains appropriate       AM-PAC PT "6 Clicks" Mobility   Outcome Measure  Help needed turning from your back to your side while in a flat bed without using bedrails?: None Help needed moving from lying on your back to sitting on the side of a flat bed without using bedrails?: None Help needed moving to and from a bed to a chair (including a wheelchair)?: A Little Help needed standing up from a chair using your arms (e.g., wheelchair or bedside chair)?: A Little Help needed to walk in hospital room?: A Little Help needed climbing 3-5 steps with a railing? : A Little 6 Click Score: 20    End of Session Equipment Utilized During Treatment: Gait belt Activity Tolerance: Patient limited  by fatigue;Patient limited by pain Patient left: in bed;with call bell/phone within reach Nurse Communication: Mobility status PT Visit Diagnosis: Unsteadiness on feet (R26.81);Muscle weakness (generalized) (M62.81);History of falling (Z91.81)     Time: 6010-9323 PT Time Calculation (min) (ACUTE ONLY): 23 min  Charges:  $Therapeutic Exercise: 23-37 mins                     West Carbo, PT, DPT   Acute Rehabilitation Department Pager #: [phone]   Sandra Cockayne 10/30/2021, 4:28 PM

## 2021-10-30 NOTE — Progress Notes (Signed)
Occupational Therapy Treatment Patient Details Name: Justin Trujillo. MRN: [id number] DOB: April 12, 1946 Today's Date: 10/30/2021   History of present illness Justin Trujillo. is a 76 y.o. male admitted 10/26/21 presents with complaints of weakness after having having falls at home. In route with EMS he was reported to have incontinence of stool and vomited.  He was hypotensive 85/40. Chest x-ray noted stable cardiomegaly with bilateral lower lobe opacities left worse than the right concerning for possible aspiration pneumonia.  CT scan of the head and cervical spine did not note any acute fractures PMH significant for esophageal adenocarcinoma currently receiving chemotherapy, hypertension, hyperlipidemia, PAF on Eliquis, DM type II, OSA, COVID (Jan 2023) and GERD.   OT comments  Patient continues to make steady progress towards goals in skilled OT session. Patient's session encompassed  functional mobility in order to increase overall activity tolerance. Patient able to complete bed mobility at supervision level, and functional mobility and transfers at min A level primarily due to weakness in standing and ambulating. Wife present for session, and is very motivated to have patient go home and work on his activity tolerance (able to verbalize exercises she has him complete at home). Discharge remains appropriate, therapy will continue to follow.    Recommendations for follow up therapy are one component of a multi-disciplinary discharge planning process, led by the attending physician.  Recommendations may be updated based on patient status, additional functional criteria and insurance authorization.    Follow Up Recommendations  Home health OT    Assistance Recommended at Discharge Frequent or constant Supervision/Assistance  Patient can return home with the following  A lot of help with walking and/or transfers;A lot of help with bathing/dressing/bathroom;Assistance with  cooking/housework;Assistance with feeding;Assist for transportation;Help with stairs or ramp for entrance   Equipment Recommendations  None recommended by OT    Recommendations for Other Services      Precautions / Restrictions Precautions Precautions: Fall Precaution Comments: has had multiple falls at home Restrictions Weight Bearing Restrictions: No       Mobility Bed Mobility Overal bed mobility: Needs Assistance Bed Mobility: Supine to Sit, Sit to Supine, Rolling Rolling: Supervision Sidelying to sit: Supervision   Sit to supine: Supervision   General bed mobility comments: Improved bed mobility with log roll technique (patient has chronic back pain)    Transfers Overall transfer level: Needs assistance Equipment used: Rolling walker (2 wheels) Transfers: Sit to/from Stand Sit to Stand: Min assist           General transfer comment: min A in order to come into standing for steadying purposes     Balance Overall balance assessment: Needs assistance Sitting-balance support: No upper extremity supported, Feet supported Sitting balance-Leahy Scale: Fair     Standing balance support: Bilateral upper extremity supported, During functional activity Standing balance-Leahy Scale: Poor Standing balance comment: Reliant on BUE support                           ADL either performed or assessed with clinical judgement   ADL                           Toilet Transfer: Minimal assistance;Ambulation;Rolling walker (2 wheels) Toilet Transfer Details (indicate cue type and reason): simulated with functional mobility         Functional mobility during ADLs: Minimal assistance;Rolling walker (2 wheels) General ADL Comments: Session focus on  functional independence to increase overall activity tolerance    Extremity/Trunk Assessment              Vision       Perception     Praxis      Cognition Arousal/Alertness:  Awake/alert Behavior During Therapy: WFL for tasks assessed/performed Overall Cognitive Status: Within Functional Limits for tasks assessed                                          Exercises      Shoulder Instructions       General Comments      Pertinent Vitals/ Pain       Pain Assessment Pain Assessment: Faces Faces Pain Scale: Hurts even more Pain Location: RLE, hx of chronic lower back pain Pain Descriptors / Indicators: Cramping, Guarding, Grimacing, Discomfort Pain Intervention(s): Limited activity within patient's tolerance, Monitored during session  Home Living                                          Prior Functioning/Environment              Frequency  Min 2X/week        Progress Toward Goals  OT Goals(current goals can now be found in the care plan section)  Progress towards OT goals: Progressing toward goals  Acute Rehab OT Goals Patient Stated Goal: to go home OT Goal Formulation: With patient Time For Goal Achievement: 11/10/21 Potential to Achieve Goals: [place] Discharge plan remains appropriate    Co-evaluation                 AM-PAC OT "6 Clicks" Daily Activity     Outcome Measure   Help from another person eating meals?: None Help from another person taking care of personal grooming?: A Little Help from another person toileting, which includes using toliet, bedpan, or urinal?: A Lot Help from another person bathing (including washing, rinsing, drying)?: A Lot Help from another person to put on and taking off regular upper body clothing?: A Little Help from another person to put on and taking off regular lower body clothing?: A Lot 6 Click Score: 16    End of Session Equipment Utilized During Treatment: Rolling walker (2 wheels);Gait belt  OT Visit Diagnosis: Unsteadiness on feet (R26.81);Other abnormalities of gait and mobility (R26.89);Repeated falls (R29.6);History of falling  (Z91.81);Muscle weakness (generalized) (M62.81);Adult, failure to thrive (R62.7);Pain Pain - Right/Left: Right Pain - part of body: Leg   Activity Tolerance Patient tolerated treatment well   Patient Left in bed;with call bell/phone within reach   Nurse Communication Mobility status        Time: 6712-4580 OT Time Calculation (min): 22 min  Charges: OT General Charges $OT Visit: 1 Visit OT Treatments $Self Care/Home Management : 8-22 mins  Corinne Ports E. Ilse Billman, OTR/L Acute Rehabilitation Services [phone] [place] 10/30/2021, 12:55 PM

## 2021-10-30 NOTE — Progress Notes (Signed)
Mobility Specialist Progress Note:  SATURATION QUALIFICATIONS: (This note is used to comply with regulatory documentation for home oxygen)  Patient Saturations on Room Air at Rest = 96%  Patient Saturations on Room Air while Ambulating = 89%  Patient Saturations on N/A Liters of oxygen while Ambulating = N/A%  Please briefly explain why patient needs home oxygen:  [place] Phone: [phone] Office Phone: [phone]

## 2021-10-30 NOTE — Discharge Summary (Signed)
Physician Discharge Summary   Patient: Justin Trujillo. MRN: [id number] DOB: [birth year]  Admit date:     10/26/2021  Discharge date: 10/30/21  Discharge Physician: Kerney Elbe   PCP: Carol Ada, MD   Recommendations at discharge:   Follow-up with PCP within 1 to 2 weeks and repeat CBC, CMP, mag, Phos Follow-up with medical oncology Dr. Lorenso Courier and have his labs rechecked later this week and if necessary obtain another Granix injection Repeat chest x-ray in 3 to 6 weeks  Discharge Diagnoses: Principal Problem:   Falls Active Problems:   Controlled type 2 diabetes mellitus with complication, without long-term current use of insulin (HCC)   COPD (chronic obstructive pulmonary disease); chronic bronchitis   Malignant neoplasm of lower third of esophagus (HCC)   Paroxysmal atrial fibrillation (HCC)   BPH (benign prostatic hyperplasia)   Acute respiratory failure with hypoxia secondary to pneumonia   History of COVID-19   Falls secondary to weakness   Pancytopenia due to antineoplastic chemotherapy (HCC)   Elevated troponin   Hyponatremia   Acute respiratory failure with hypoxia (HCC)   Hypotension   Hypoalbuminemia   Cardiomyopathy ([place])   Diarrhea   Hypokalemia   Hypophosphatemia  Resolved Problems:   * No resolved hospital problems. [facility] Course: The patient is a 75 year old chronically ill-appearing Caucasian male with a past medical history significant for per #2 esophageal adenocarcinoma currently receiving chemotherapy, hypertension, hyperlipidemia, PAF on anticoagulation with Eliquis, diabetes mellitus type 2, OSA, GERD his other comorbidities who presents after complaints of weakness having multiple falls at home.  His wife was present at bedside and gave additional history and apparently patient had fallen twice last week on separate days but then fell yesterday evening following his chemotherapy infusion.  At that time the patient's wife was able to  get him back into the bed but he report he fell early again in the morning and then she called EMS.  He reports that he had COVID back in January of this year and was treated with Paxlovid.  Since that time he has had a persistent cough and the cough has been productive sometimes he has had a hard time comprehending up.  He been treated for doxycycline with the possibility of bronchitis but overall his appetite has been poor and has not been eating much.  He complained of pain all over and approximately 3 to 4 weeks ago he discharged from oxycodone to Dilaudid for pain control.  To her knowledge patient has not had any fevers is not on oxygen at baseline.  The wife also notes that he has had high blood pressures but over the last several weeks they have been lower.  His blood pressure medications were stopped except furosemide.  She notes that he has had some lower extremity swelling and he did admit to getting choked up when eating but reports this is infrequent.  He was brought to the ED he was reported to have incontinence of stool and had vomited.  He was hypotensive on arrival and was bolused with normal saline with repeat blood pressure improving.  Chest x-ray showed stable cardiomegaly with bilateral lower lobe opacities left side was worse on right with concerns for aspiration pneumonia.   He had a CT of the head and  cervical spine that did not show any acute fractures.  He was placed on IV Rocephin and Doxycycline and admitted for acute respiratory failure with hypoxia secondary to pneumonia as well as his  cardiomyopathy.  He continues to have some significant diarrhea so we will need to work this up for infection.  C. difficile was negative and GI pathogen panel was negative as well.  Patient's procalcitonin level slightly elevated so we will continue antibiotics.  Continues to feel weak and did not have blood cultures on admission so obtained and this showed no growth to date.  Patient was  neutropenic and his [place] is 800 yesterday but improved today to 1200.  We will need to continue monitor carefully.  Sodium and phosphorus are on the lower side so we will replete and this was done with IV sodium Phos prior to discharge.  Patient's chest x-ray showed improvement and he ambulated with physical therapy and they recommend home health PT.  He did not desaturate and felt fairly well.  Chest x-ray shows improvement and patient clinically feels improved.  I spoke with his medical oncologist who felt that from their standpoint we could discharge him home and have him follow-up later this week.  He was transitioned to oral antibiotics and discharged home in satisfactory and stable condition with home health PT and OT  Assessment and Plan: * Falls- (present on admission) -PT/OT evaluated and IVF now stopped -Continue Supportive Care -PT/OT Recommending Home Health  Hypophosphatemia -Phos Level was 2.1 -Replete with IV Sodium Phos 30 mmol again today -Continue to Monitor and Replete as Necessary -Repeat Phos Level in the AM   Hypokalemia -K+ was 3.9 today  -Mag Level was 1.7 so will replete with IV Mag Sulfate 2 grams -Continue monitor and treat as necessary -Repeat CMP in a.m.  Diarrhea -Has had significant amount of diarrhea so check a GI pathogen panel as well as a C. Difficile; C Difficile is Negative and GI Pathogen Panel Negative -Continue to monitor carefully and if he rules out for infectious etiology can likely start antidiarrheals but has not had any Diarrhea but had a normal large bowel movement today    Cardiomyopathy ([place]) -Last echocardiogram noted EF of 65 to 70% with apex noted to be akinetic indicating a possible stress cardiomyopathy versus distal LAD disease with no signs of LV thrombus in 07/2021.   -BNP was noted to be 214, but no significant JVD appreciated on physical exam.  -Strict intake and output; Patient is +338.3 Liters  -Daily weights -Furosemide  currently on hold due to hypotension and may resume at D/C   -He is appearing more Euvolemic    Hypoalbuminemia- (present on admission) -On admission albumin was noted to be low at 2.9 and is now 2.2 -Wife notes that patient has had poor p.o. intake.  -Noted to have bilateral lower extremity edema which may be secondary to hypoalbuminemia. -Check prealbumin and was 13.7 -Glucerna and will need nutritionist evaluation  Hypotension- (present on admission) -Arrival to EMS patient was noted to have blood pressure as low as 85/40.  Blood pressure improved after receiving IV fluids.  -MAP currently maintained greater than 65 and will need to continue to monitor. -Continue to Holding diuretics at this time and consider resuming in the AM  -IV fluids now stopped -Repeat Blood Pressure this AM was 118/92 this AM  Acute respiratory failure with hypoxia ([place])- (present on admission) -SpO2: 100 % O2 Flow Rate (L/min): 3 L/min -Continuous pulse oximetry maintain O2 saturation greater than 90% -Continue supplemental oxygen via nasal cannula and wean O2 as tolerated  -Patient was weaned off of supplemental oxygen and had ambulatory O2 screen prior to discharge and improved  significantly.  He is stable to be discharged and his chest x-ray shows improvement when to follow-up with PCP and medical oncology within 1 to 2 weeks and repeat chest x-ray in 3 to 6 weeks  Hyponatremia- (present on admission) -Acute on chronic.  Review of records notes that patient's sodium levels have been slowly trending down currently 129 on admission.   -He had still been taking  furosemide 40 mg daily.  He has been given 500 mL bolus of IV fluids -Held furosemide -Orders placed to check urine sodium -Normal saline IV fluids at 75 mL/h now stopped so will replete with IV Sodium Phos -Recheck sodium levels showing improvement and now Na+ is 131 again   Elevated troponin- (present on admission) -High-sensitivity troponin  37->47. -Check EKG and likely this is demand ischemia -Follow up in the outpatient setting  Pancytopenia due to antineoplastic chemotherapy ([place])- (present on admission) -On admission patient was noted to have hemoglobin 12.9 with platelet count 114 which appears similar to priors.  -Continue to monitor and repeat CBC on 10/29/2021 shows a patient is pancytopenic now with a WBC of 0.8, hemoglobin/hematocrit of 9.4/28.1, and a platelet count of 85 and this is in the setting of his chemotherapy -Today's CBC shows that he has WBC of 1.7, hemoglobin/hematocrit 9.2/28.0 and the platelet count of 97 -Patient was getting Granix 300 mg subcu daily and received a dose yesterday and will receive a dose tonight prior to discharge; ANC today was 1200 -Checking Blood Cx x2 and showed no growth to date 2 days -Repeat CXR this a.m. showed "Largely resolved hazy right lung base opacities.Stable hazy streaky opacities in the peripheral mid to lower left lung, favoring pneumonia. Stable cardiomegaly." -Anemia panel done and showed an iron level of 138, UIBC 68, TIBC 207, saturation ratio 67%, ferritin level 734, folate level 55.7 and vitamin B12 level 738 -Continue with empiric antibiotics for now and repeat CBC within 1 week  Falls secondary to weakness -Patient presents with complaints of weakness after having several falls here recently.  Suspect symptoms are likely secondary to dehydration in the setting of poor p.o. intake on chemotherapy and now diarrhea.  Patient has been given IV fluids due to hypotension. -Admit medical telemetry bed -Continue to Hold diuretics -Check TSH was 0.935 -PT/OT to eval and treat and recommending home health for now but may progress to SNF if he does not improve -Transitions of care consulted and will D/C with Home Health as he is improved  History of COVID-19- (present on admission) -Patient had been treated for COVID-19 infection in January with paxlovid.  Acute  respiratory failure with hypoxia secondary to pneumonia- (present on admission) -Patient presents with complaints of weakness and productive cough.   -Chest x-ray concerning for bilateral infiltrates worse on the left than on the right.  ABG noted pH 7.401, P CO2 36.3, and PaO2 of 76.  He has been placed on 3 L of nasal cannula oxygen with O2 saturations 92%.  BNP 214.5.   -He did admit to intermittently getting choked up with eating and given that he has esophageal cancer suspect he is possibly aspirating.   -SpO2: 100 % O2 Flow Rate (L/min): 3 L/min; Or has been weaned -Xopenex/Atrovent, Flutter Valve, and Incentive Spirometry  -At this time patient and wife declined need for formal speech evaluation.   -Patient has been started on Rocephin . On the differential includes pulmonary edema, but seems less likely.  -Aspiration precautions -Elevate head of the bed greater  than 30 degrees -Continuous pulse oximetry with nasal cannula oxygen maintain O2 saturation greater than 92%. -Check procalcitonin elevated at 1.75 -Incentive spirometry and flutter valve -Continue empiric antibiotics of Rocephin and added on Doxycycline IV for atypical coverage and changed to po Doxy and po Cefuroxime -Repeat CXR int he AM as repeat CXR here showed "Largely resolved hazy right lung base opacities. Stable hazy streaky opacities in the peripheral mid to lower left lung, favoring pneumonia. Stable cardiomegaly." -No stable to be discharged and transition to p.o. antibiotics  BPH (benign prostatic hyperplasia)- (present on admission) -Continue Flomax  Paroxysmal atrial fibrillation ([place])- (present on admission) -Patient appears to be currently rate controlled. -Home medications include amiodarone 200 mg daily. -Continue amiodarone and anticoagulation with Eliquis -Continue telemetry monitoring while hospitalized  Malignant neoplasm of lower third of esophagus ([place])- (present on admission) -Patient is currently  undergoing chemotherapy last infusion 2/8 and followed by Dr. Lorenso Courier of oncology. -Dr. Lorenso Courier added to the treatment team via epic and after discussion with Dr. Lindi Adie yesterday will add Granix daily and received a dose yesterday and will receive a dose today -Continue Dilaudid as needed for pain  COPD (chronic obstructive pulmonary disease); chronic bronchitis- (present on admission) -Patient has some mild expiratory wheezes intermittently on physical exam. -Breathing treatments as needed -If symptoms persist may warrant steroids -Flutter Valve, Incentive Spirometry, and Guaifenesin 1200 mg po BID -Start Xopenex/Atrovent  -SpO2: 96 % O2 Flow Rate (L/min): 3 L/min ; Weaned off of supplemental O2 via [place] and did not desaturate on Ambulatory Home O2 screen -Repeat CXR showed improvement on the Right -Continue to Monitor Respiratory Status carefully   Controlled type 2 diabetes mellitus with complication, without long-term current use of insulin ([place])- (present on admission) -On admission glucose elevated up to 178.   -Last available hemoglobin A1c was 5.9 06/2021.  Home medications include Ozempic. -Hypoglycemic protocols -CBGs before every meal with very sensitive SSI -Adjust regimen as needed -CBGs ranging from 92-140   Pain control - [place] was reviewed. and patient was instructed, not to drive, operate heavy machinery, perform activities at heights, swimming or participation in water activities or provide baby-sitting services while on Pain, Sleep and Anxiety Medications; until their outpatient Physician has advised to do so again. Also recommended to not to take more than prescribed Pain, Sleep and Anxiety Medications.   Consultants: Discussed case with Medical Oncology Procedures performed: None  Disposition: Home health Diet recommendation:  Discharge Diet Orders (From admission, onward)     Start     Ordered   10/30/21 0000   Diet - low sodium heart healthy        10/30/21 1602           Cardiac and Carb modified diet  DISCHARGE MEDICATION: Allergies as of 10/30/2021       Reactions   Iodinated Contrast Media Anaphylaxis   Moxifloxacin Swelling   REACTION: GI upset, throat "tightened up"   Penicillins Anaphylaxis   Did it involve swelling of the face/tongue/throat, SOB, or low BP? Yes Did it involve sudden or severe rash/hives, skin peeling, or any reaction on the inside of your mouth or nose? No Did you need to seek medical attention at a hospital or doctor's office? Yes When did it last happen?   been a while     If all above answers are NO, may proceed with cephalosporin use.   Shellfish-derived Products Anaphylaxis   Amoxicillin Swelling   Throat  tightened up   Chlorhexidine Other (See Comments)   Wife is not aware of this allergy   Latex Rash   Reaction to prolonged exposure        Medication List     STOP taking these medications    furosemide 40 MG tablet Commonly known as: Lasix   lidocaine-prilocaine cream Commonly known as: EMLA   magnesium oxide 400 MG tablet Commonly known as: MAG-OX   pantoprazole 40 MG tablet Commonly known as: PROTONIX   potassium chloride SA 20 MEQ tablet Commonly known as: KLOR-CON M   prochlorperazine 10 MG tablet Commonly known as: COMPAZINE       TAKE these medications    acetaminophen 650 MG CR tablet Commonly known as: TYLENOL Take 650 mg by mouth every 6 (six) hours. Take with dilaudid, gabapentin and tizanidine - scheduled What changed: Another medication with the same name was added. Make sure you understand how and when to take each.   acetaminophen 325 MG tablet Commonly known as: TYLENOL Take 2 tablets (650 mg total) by mouth every 6 (six) hours as needed for mild pain (or Fever >/= 101). What changed: You were already taking a medication with the same name, and this prescription was added. Make sure you understand how and  when to take each.   albuterol 108 (90 Base) MCG/ACT inhaler Commonly known as: VENTOLIN HFA Inhale 1 puff into the lungs every 6 (six) hours as needed for wheezing.   ALPRAZolam 0.5 MG tablet Commonly known as: XANAX Take 0.5 mg by mouth 2 (two) times daily.   amiodarone 200 MG tablet Commonly known as: PACERONE Take 2 tablets (400 mg total) by mouth 2 (two) times daily for 7 days, THEN 1 tablet (200 mg total) daily. Start taking on: August 01, 2021 What changed: See the new instructions.   apixaban 5 MG Tabs tablet Commonly known as: ELIQUIS Take 1 tablet (5 mg total) by mouth 2 (two) times daily.   atorvastatin 40 MG tablet Commonly known as: LIPITOR Take 1 tablet (40 mg total) by mouth daily. What changed: when to take this   cefUROXime 500 MG tablet Commonly known as: CEFTIN Take 1 tablet (500 mg total) by mouth every 12 (twelve) hours for 2 days.   doxycycline 100 MG tablet Commonly known as: VIBRA-TABS Take 1 tablet (100 mg total) by mouth every 12 (twelve) hours for 4 days.   escitalopram 10 MG tablet Commonly known as: LEXAPRO Take 10 mg by mouth every morning.   feeding supplement (GLUCERNA SHAKE) Liqd Take 237 mLs by mouth 3 (three) times daily between meals.   gabapentin 300 MG capsule Commonly known as: NEURONTIN Take 300 mg by mouth every 6 (six) hours. Take with tylenol, dilaudid, and tizanidine - scheduled What changed: Another medication with the same name was removed. Continue taking this medication, and follow the directions you see here.   guaiFENesin 600 MG 12 hr tablet Commonly known as: MUCINEX Take 1 tablet (600 mg total) by mouth 2 (two) times daily for 5 days.   HYDROmorphone 8 MG tablet Commonly known as: DILAUDID Take 8 mg by mouth every 6 (six) hours. Take with tylenol, gabapentin and tizanidine - scheduled   magnesium oxide 400 (240 Mg) MG tablet Commonly known as: MAG-OX Take 1 tablet (400 mg total) by mouth daily. Start taking  on: October 31, 2021   multivitamin with minerals Tabs tablet Take 1 tablet by mouth daily with lunch. Centrum Silver   Myrbetriq 50 MG  Tb24 tablet Generic drug: mirabegron ER Take 50 mg by mouth at bedtime.   ondansetron 4 MG disintegrating tablet Commonly known as: Zofran ODT Take 1 tablet (4 mg total) by mouth every 8 (eight) hours as needed for nausea or vomiting.   ondansetron 4 MG tablet Commonly known as: ZOFRAN Take 1 tablet (4 mg total) by mouth every 6 (six) hours as needed for nausea.   Ozempic (0.25 or 0.5 MG/DOSE) 2 MG/1.5ML Sopn Generic drug: Semaglutide(0.25 or 0.5MG /DOS) Inject 0.5 mg into the skin every Wednesday.   primidone 50 MG tablet Commonly known as: MYSOLINE Take 1 tablet (50 mg total) by mouth in the morning and at bedtime.   rOPINIRole 0.5 MG tablet Commonly known as: REQUIP Take 0.5 mg by mouth at bedtime.   sodium chloride 0.65 % Soln nasal spray Commonly known as: OCEAN Place 1 spray into both nostrils as needed for congestion.   sucralfate 1 GM/10ML suspension Commonly known as: CARAFATE Take 10 mLs (1 g total) by mouth 4 (four) times daily -  with meals and at bedtime.   tamsulosin 0.4 MG Caps capsule Commonly known as: FLOMAX Take 0.4 mg by mouth 2 (two) times daily.   testosterone cypionate 200 MG/ML injection Commonly known as: DEPOTESTOSTERONE CYPIONATE Inject 200 mg into the muscle every 14 (fourteen) days.   tiZANidine 4 MG tablet Commonly known as: ZANAFLEX Take 4 mg by mouth every 6 (six) hours. Take with tylenol, dilaudid and gabapentin - scheduled   trolamine salicylate 10 % cream Commonly known as: ASPERCREME Apply 1 application topically 2 (two) times daily.   Vitamin D 50 MCG (2000 UT) tablet Take 2,000 Units by mouth every morning.        Follow-up Information     Carol Ada, MD Follow up on 11/15/2021.   Specialty: Family Medicine Why: 11:15 for hospital follow up Contact information: [place] [address] [place], [facility] [place] Follow up.   Specialty: Home Health Services Why: HHPT, HHOT Contact information: [place] [address] [phone]                 Discharge Exam: Danley Danker Weights   10/28/21 0446 10/29/21 0430 10/30/21 0347  Weight: 68.6 kg 70.5 kg 68.6 kg   Vitals:   10/30/21 1217 10/30/21 1249  BP: (!) 118/92   Pulse:    Resp: 13   Temp: 98.3 F (36.8 C)   SpO2: 92% 92%   Examination: Physical Exam:  Constitutional: Chronically ill-appearing overweight Caucasian male currently no acute distress appears calm, Respiratory: Diminished to auscultation bilaterally with coarse breath sounds worse on the left compared to the right and some slight rhonchi and minimal crackles. Normal respiratory effort and patient is not tachypenic. No accessory muscle use.  Unlabored breathing and not wearing supplemental oxygen nasal cannula Cardiovascular: Slightly tachycardic rate but regular rhythm, no murmurs / rubs / gallops. S1 and S2 auscultated.  Mild extremity edema Abdomen: Soft, non-tender, distended secondary body habitus.  Bowel sounds positive.  GU: Deferred.  Condition at discharge: stable  The results of significant diagnostics from this hospitalization (including imaging, microbiology, ancillary and laboratory) are listed below for reference.   Imaging Studies: CT Abdomen Pelvis Wo Contrast  Result Date: 10/23/2021 CLINICAL DATA:  Esophageal cancer, assess treatment response, chemotherapy in progress, radiation therapy complete. Shortness of breath on exertion and cough. EXAM: CT CHEST,  ABDOMEN AND PELVIS WITHOUT CONTRAST TECHNIQUE: Multidetector CT imaging of the chest, abdomen and pelvis was performed following the standard protocol without IV contrast. RADIATION DOSE REDUCTION: This exam was performed according to the departmental dose-optimization program which  includes automated exposure control, adjustment of the mA and/or kV according to patient size and/or use of iterative reconstruction technique. COMPARISON:  07/13/2021. FINDINGS: CT CHEST FINDINGS Cardiovascular: Right IJ Port-A-Cath terminates in the low SVC. Atherosclerotic calcification of the aorta, aortic valve and coronary arteries. Ascending aorta measures up to 4.3 cm. Heart is at the upper limits of normal in size to mildly enlarged. Dense mitral annulus calcification. No pericardial effusion. Mediastinum/Nodes: No pathologically enlarged mediastinal, periesophageal or axillary lymph nodes. Hilar regions are difficult to definitively evaluate without IV contrast but appear grossly unremarkable. Slight distal esophageal wall thickening. Lungs/Pleura: Subpleural reticular densities and ground-glass may be minimally progressive. Associated traction bronchiolectasis. 4 mm peripheral left lower lobe nodule (6/117), new. No pleural fluid. Minimal debris in the airway. Musculoskeletal: Degenerative changes in the spine. Bilateral shoulder arthroplasties. T11 and T12 compression fractures are new from 07/13/2021. No worrisome lytic or sclerotic lesions. CT ABDOMEN PELVIS FINDINGS Hepatobiliary: Liver and gallbladder are unremarkable. No biliary ductal dilatation. Pancreas: Negative. Spleen: Negative. Adrenals/Urinary Tract: Right adrenal gland is unremarkable. There may be slight nodular thickening of the left adrenal gland. Kidneys are unremarkable. Ureters are decompressed. Ventral bladder wall thickening. Stomach/Bowel: Stomach, small bowel and appendix are unremarkable. A fair amount of stool in colon is indicative of constipation. Vascular/Lymphatic: Atherosclerotic calcification of the aorta. No pathologically enlarged lymph nodes. Reproductive: Prostate is mildly prominent. Other: No free fluid. Mesenteries and peritoneum are otherwise unremarkable. Musculoskeletal: Degenerative and postoperative changes in  the spine. Dextroconvex scoliosis of the lumbar spine. IMPRESSION: 1. Residual distal esophageal wall thickening. No definitive evidence metastatic disease. 2. 4 mm peripheral left lower lobe nodule may be new. Recommend attention on follow-up. 3. Subpleural reticular densities and ground-glass may be minimally progressive. Associated traction bronchiolectasis. Findings may reflect nonspecific interstitial pneumonitis or usual interstitial pneumonitis and could be treatment related. 4. Ascending aortic aneurysm. Recommend annual imaging followup by CTA or MRA. This recommendation follows 2010 ACCF/AHA/AATS/ACR/ASA/SCA/SCAI/SIR/STS/SVM Guidelines for the Diagnosis and Management of Patients with Thoracic Aortic Disease. Circulation. 2010; 121: T062-I948. Aortic aneurysm NOS (ICD10-I71.9). 5. Mild prostate prominence with ventral bladder wall thickening, indicative of an element outlet obstruction. 6. New T11 and T12 compression fractures. 7. Aortic atherosclerosis (ICD10-I70.0). Coronary artery calcification. Electronically Signed   By: Lorin Picket M.D.   On: 10/23/2021 13:10   CT HEAD WO CONTRAST  Result Date: 10/26/2021 CLINICAL DATA:  Moderate to severe head trauma EXAM: CT HEAD WITHOUT CONTRAST CT CERVICAL SPINE WITHOUT CONTRAST TECHNIQUE: Multidetector CT imaging of the head and cervical spine was performed following the standard protocol without intravenous contrast. Multiplanar CT image reconstructions of the cervical spine were also generated. RADIATION DOSE REDUCTION: This exam was performed according to the departmental dose-optimization program which includes automated exposure control, adjustment of the mA and/or kV according to patient size and/or use of iterative reconstruction technique. COMPARISON:  None. FINDINGS: CT HEAD FINDINGS Brain: No evidence of acute infarction, hemorrhage, hydrocephalus, extra-axial collection or mass lesion/mass effect. Chronic small vessel ischemia in the  hemispheric white matter. Cerebral volume loss, central predominant. Small, incidental calcification in the inter hemispheric fissure. Vascular: No hyperdense vessel or unexpected calcification. Skull: No acute fracture. Sinuses/Orbits: Bilateral cataract resection. Fluid levels that are low-density in the left maxillary and sphenoid sinuses. Left  mastoid and middle ear opacification that is subtotal. CT CERVICAL SPINE FINDINGS Alignment: Reversal of cervical lordosis. Skull base and vertebrae: Streak artifact intermittent motion affects visualization, streak artifact heavily affecting the C1 and C2 levels. No convincing fracture. Generalized osteopenia. Soft tissues and spinal canal: No prevertebral fluid or swelling. No visible canal hematoma. Disc levels: Generalized degenerative disc narrowing and ridging. Multilevel facet spurring. Upper chest: Negative IMPRESSION: 1. No evidence of acute intracranial injury. Negative for cervical spine fracture. 2. Left-sided sinusitis with fluid levels. Left mastoid and middle ear opacification. 3. 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 Electronically Signed   By: Jorje Guild M.D.   On: 10/26/2021 10:26   CT CHEST WO CONTRAST  Result Date: 10/23/2021 CLINICAL DATA:  Esophageal cancer, assess treatment response, chemotherapy in progress, radiation therapy complete. Shortness of breath on exertion and cough. EXAM: CT CHEST, ABDOMEN AND PELVIS WITHOUT CONTRAST TECHNIQUE: Multidetector CT imaging of the chest, abdomen and pelvis was performed following the standard protocol without IV contrast. RADIATION DOSE REDUCTION: This exam was performed according to the departmental dose-optimization program which includes automated exposure control, adjustment of the mA and/or kV according to patient size and/or use of iterative reconstruction technique. COMPARISON:  07/13/2021. FINDINGS: CT CHEST FINDINGS Cardiovascular: Right IJ Port-A-Cath terminates in  the low SVC. Atherosclerotic calcification of the aorta, aortic valve and coronary arteries. Ascending aorta measures up to 4.3 cm. Heart is at the upper limits of normal in size to mildly enlarged. Dense mitral annulus calcification. No pericardial effusion. Mediastinum/Nodes: No pathologically enlarged mediastinal, periesophageal or axillary lymph nodes. Hilar regions are difficult to definitively evaluate without IV contrast but appear grossly unremarkable. Slight distal esophageal wall thickening. Lungs/Pleura: Subpleural reticular densities and ground-glass may be minimally progressive. Associated traction bronchiolectasis. 4 mm peripheral left lower lobe nodule (6/117), new. No pleural fluid. Minimal debris in the airway. Musculoskeletal: Degenerative changes in the spine. Bilateral shoulder arthroplasties. T11 and T12 compression fractures are new from 07/13/2021. No worrisome lytic or sclerotic lesions. CT ABDOMEN PELVIS FINDINGS Hepatobiliary: Liver and gallbladder are unremarkable. No biliary ductal dilatation. Pancreas: Negative. Spleen: Negative. Adrenals/Urinary Tract: Right adrenal gland is unremarkable. There may be slight nodular thickening of the left adrenal gland. Kidneys are unremarkable. Ureters are decompressed. Ventral bladder wall thickening. Stomach/Bowel: Stomach, small bowel and appendix are unremarkable. A fair amount of stool in colon is indicative of constipation. Vascular/Lymphatic: Atherosclerotic calcification of the aorta. No pathologically enlarged lymph nodes. Reproductive: Prostate is mildly prominent. Other: No free fluid. Mesenteries and peritoneum are otherwise unremarkable. Musculoskeletal: Degenerative and postoperative changes in the spine. Dextroconvex scoliosis of the lumbar spine. IMPRESSION: 1. Residual distal esophageal wall thickening. No definitive evidence metastatic disease. 2. 4 mm peripheral left lower lobe nodule may be new. Recommend attention on follow-up. 3.  Subpleural reticular densities and ground-glass may be minimally progressive. Associated traction bronchiolectasis. Findings may reflect nonspecific interstitial pneumonitis or usual interstitial pneumonitis and could be treatment related. 4. Ascending aortic aneurysm. Recommend annual imaging followup by CTA or MRA. This recommendation follows 2010 ACCF/AHA/AATS/ACR/ASA/SCA/SCAI/SIR/STS/SVM Guidelines for the Diagnosis and Management of Patients with Thoracic Aortic Disease. Circulation. 2010; 121: Q761-P509. Aortic aneurysm NOS (ICD10-I71.9). 5. Mild prostate prominence with ventral bladder wall thickening, indicative of an element outlet obstruction. 6. New T11 and T12 compression fractures. 7. Aortic atherosclerosis (ICD10-I70.0). Coronary artery calcification. Electronically Signed   By: Lorin Picket M.D.   On: 10/23/2021 13:10   CT CERVICAL SPINE WO CONTRAST  Result Date: 10/26/2021 CLINICAL  DATA:  Moderate to severe head trauma EXAM: CT HEAD WITHOUT CONTRAST CT CERVICAL SPINE WITHOUT CONTRAST TECHNIQUE: Multidetector CT imaging of the head and cervical spine was performed following the standard protocol without intravenous contrast. Multiplanar CT image reconstructions of the cervical spine were also generated. RADIATION DOSE REDUCTION: This exam was performed according to the departmental dose-optimization program which includes automated exposure control, adjustment of the mA and/or kV according to patient size and/or use of iterative reconstruction technique. COMPARISON:  None. FINDINGS: CT HEAD FINDINGS Brain: No evidence of acute infarction, hemorrhage, hydrocephalus, extra-axial collection or mass lesion/mass effect. 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 Cerebral volume loss, central predominant. Small, incidental calcification in the inter hemispheric fissure. Vascular: No hyperdense vessel or unexpected calcification. Skull: No acute fracture. Sinuses/Orbits: Bilateral  cataract resection. Fluid levels that are low-density in the left maxillary and sphenoid sinuses. Left mastoid and middle ear opacification that is subtotal. CT CERVICAL SPINE FINDINGS Alignment: Reversal of cervical lordosis. Skull base and vertebrae: Streak artifact intermittent motion affects visualization, streak artifact heavily affecting the C1 and C2 levels. No convincing fracture. Generalized osteopenia. Soft tissues and spinal canal: No prevertebral fluid or swelling. No visible canal hematoma. Disc levels: Generalized degenerative disc narrowing and ridging. Multilevel facet spurring. Upper chest: Negative IMPRESSION: 1. No evidence of acute intracranial injury. Negative for cervical spine fracture. 2. Left-sided sinusitis with fluid levels. Left mastoid and middle ear opacification. 3. 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 Electronically Signed   By: Jorje Guild M.D.   On: 10/26/2021 10:26   DG Pelvis Portable  Result Date: 10/26/2021 CLINICAL DATA:  Found down.  Weakness EXAM: PORTABLE PELVIS 1-2 VIEWS COMPARISON:  10/20/2021 FINDINGS: Bones appear demineralized. There is no evidence of pelvic fracture or diastasis. No pelvic bone lesions are seen. Large volume of stool projects throughout the visualized colon. Atherosclerotic vascular calcifications are present. IMPRESSION: Negative. Electronically Signed   By: Davina Poke D.O.   On: 10/26/2021 09:33   DG CHEST PORT 1 VIEW  Result Date: 10/30/2021 CLINICAL DATA:  Dyspnea EXAM: PORTABLE CHEST 1 VIEW COMPARISON:  Chest radiograph from one day prior. FINDINGS: Right internal jugular Port-A-Cath terminates in the middle third of the SVC. Right shoulder hemiarthroplasty and partially visualized left shoulder total arthroplasty. Stable cardiomediastinal silhouette with mild cardiomegaly. No pneumothorax. No pleural effusion. Hazy streaky opacities in peripheral mid to lower left lung, similar. Largely resolved hazy  right lung base opacities. IMPRESSION: 1. Largely resolved hazy right lung base opacities. 2. Stable hazy streaky opacities in the peripheral mid to lower left lung, favoring pneumonia. 3. Stable cardiomegaly. Electronically Signed   By: Ilona Sorrel M.D.   On: 10/30/2021 08:05   DG CHEST PORT 1 VIEW  Result Date: 10/29/2021 CLINICAL DATA:  76 year old male with history of shortness of breath. EXAM: PORTABLE CHEST 1 VIEW COMPARISON:  Chest x-ray 10/28/2021. FINDINGS: Right internal jugular single-lumen power porta cath with tip terminating in the mid superior vena cava. Lung volumes are slightly low. Diffuse peribronchial cuffing and widespread interstitial prominence, with patchy ill-defined peripheral predominant airspace disease, similar to the prior study. No pneumothorax. No suspicious appearing pulmonary nodules or masses are noted. Heart size is normal. Upper mediastinal contours are within normal limits. Status post bilateral shoulder arthroplasties. IMPRESSION: 1. The appearance the chest again suggests multilobar bilateral bronchopneumonia, with similar aeration compared to the prior examination. 2. Postoperative changes and support apparatus, as above. Electronically Signed   By: Quillian Quince  Entrikin M.D.   On: 10/29/2021 09:23   DG CHEST PORT 1 VIEW  Result Date: 10/28/2021 CLINICAL DATA:  Shortness of breath Evaluate for pneumonia EXAM: PORTABLE CHEST 1 VIEW COMPARISON:  10/26/2021 FINDINGS: Unchanged mild cardiomegaly. Right chest port is unchanged in position. Bilateral shoulder prostheses again seen. Mild pulmonary vascular congestion is unchanged. Peripheral lung opacities are not significantly changed compared to prior CT exam. IMPRESSION: 1. Unchanged mild cardiomegaly. 2. Unchanged bilateral subpleural parenchymal opacities. Electronically Signed   By: Miachel Roux M.D.   On: 10/28/2021 13:42   DG Chest Port 1 View  Result Date: 10/26/2021 CLINICAL DATA:  Family found the patient on the  floor, last seen yesterday after chemotherapy when patient complained of weakness. Patient does not remember falling. EXAM: PORTABLE CHEST 1 VIEW COMPARISON:  CT examination dated October 20, 2021 FINDINGS: The heart is enlarged. There are bilateral lower lobe opacities, left worse than the right concerning for pneumonia including aspiration pneumonia. Bilateral shoulder arthroplasty. Right IJ access MediPort with distal tip in the SVC. IMPRESSION: 1. Stable cardiomegaly. 2. Bilateral lower lobe opacities, left worse than the right concerning for pneumonia including aspiration pneumonia. Follow-up examination to resolution is recommended. Electronically Signed   By: Keane Police D.O.   On: 10/26/2021 09:35   CT CHEST ABDOMEN PELVIS WO CONTRAST  Result Date: 10/26/2021 CLINICAL DATA:  76 year old male presents for evaluation following fall. Patient also with history of esophageal neoplasm. EXAM: CT CHEST, ABDOMEN AND PELVIS WITHOUT CONTRAST TECHNIQUE: Multidetector CT imaging of the chest, abdomen and pelvis was performed following the standard protocol without IV contrast. RADIATION DOSE REDUCTION: This exam was performed according to the departmental dose-optimization program which includes automated exposure control, adjustment of the mA and/or kV according to patient size and/or use of iterative reconstruction technique. COMPARISON:  October 20, 2021. FINDINGS: CT CHEST FINDINGS Cardiovascular: The aorta shows smooth contours without adjacent stranding. No mediastinal hematoma. Aortic dilation to approximately 4 cm greatest axial dimension is unchanged. Generalized aortic atherosclerosis. Signs of mitral annular calcification. Three-vessel coronary artery disease. No substantial pericardial effusion, stable small pericardial effusion when compared to previous imaging from just 6 days ago RIGHT-sided Port-A-Cath terminates at the caval to atrial junction. Mediastinum/Nodes: Patulous esophagus similar to the  prior exam. Mild thickening of the distal esophagus also unchanged in this patient with known history of esophageal neoplasm. No mediastinal hematoma or adenopathy. No axillary or thoracic inlet adenopathy. Lungs/Pleura: No pneumothorax. Reticular opacities and nodular changes along the pleural surface with increasing conspicuity since previous imaging. Pleural based airspace disease with nodular features at the lung bases and along the anterior chest, these findings have developed since the previous exam of just 6 days ago. No pneumothorax. Material and the trachea and material layering in the mainstem bronchi extending into peripheral bronchi with signs of bronchial wall thickening. Musculoskeletal: See below for full musculoskeletal details. CT ABDOMEN PELVIS FINDINGS Hepatobiliary: Liver with smooth contours. Trace fluid adjacent to medial RIGHT hemi liver similar to previous imaging. No gross signs of hepatic trauma. No Peri hepatic fluid. No pericholecystic stranding. Pancreas: Pancreatic atrophy without signs of adjacent inflammation. Spleen: Trace stranding adjacent to the spleen. Minimal fluid adjacent to the spleen is similar to the study of October 20, 2021. No gross splenic abnormality. Adrenals/Urinary Tract: Mild bilateral adrenal thickening. Perinephric stranding bilaterally perhaps slightly increased compared to previous imaging. Generalized increase in fascial thickening and edema albeit mild since the previous study. No hydronephrosis.  No perinephric hematoma. Stomach/Bowel: Small hiatal  hernia and distal esophageal thickening as described. No signs of bowel dilation to suggest obstruction of the small bowel. The appendix is normal. Colon is stool filled without signs of adjacent stranding. No signs of pneumatosis. No pneumoperitoneum. No pelvic fluid. Vascular/Lymphatic: Aortic atherosclerosis. No sign of aneurysm. Smooth contour of the IVC. There is no gastrohepatic or hepatoduodenal ligament  lymphadenopathy. No retroperitoneal or mesenteric lymphadenopathy. No pelvic sidewall lymphadenopathy. Atherosclerosis is moderate to marked. Vascular structures not well assessed given the lack of intravenous contrast. No focal stranding or hematoma adjacent to the abdominal aorta or pelvic vessels. Reproductive: Unremarkable by CT. Urinary bladder is moderately distended extending to the sacral promontory. Other: No fluid in the pelvis. Trace fluid about the liver and spleen similar to recent comparison imaging. Musculoskeletal: No displaced rib fractures. Mildly limited assessment due to respiratory motion. Bony pelvis without signs of fracture. Extensive spinal degenerative changes and osteopenia. Post bilateral shoulder arthroplasty. Visualized clavicles and scapulae are intact. Signs of prior L3-L4 spinal fusion and cement augmentation at L4 showing no change. Spinal compression fractures with similar appearance at the T11-T12 level based on recent comparison imaging with approximally 30% loss of height at these levels. Spinal alignment is unchanged. Sternum grossly intact with some artifact crossing the anterior sternum due to motion. IMPRESSION: 1. No evidence of acute traumatic injury to the chest, abdomen or pelvis. 2. Peripheral nodular parenchymal consolidative changes with marked worsening since the recent comparison imaging potentially post/drug treatment related given pattern seen and rapid interval progression. Correlate with any respiratory symptoms. Findings are felt to have an overlay of acute inflammatory changes related to aspiration given findings in the bronchi. However, subpleural changes extend along the anterior chest which would be atypical for purely aspiration related changes. 3. Small hiatal hernia and distal esophageal thickening also unchanged in this patient with known history of esophageal neoplasm. 4. Mild generalized edema with perinephric stranding and fascial edema in the  abdomen not substantially changed compared to recent imaging. 5. Unchanged appearance of T11-T12 compression fractures. 6. Aortic atherosclerosis. Three-vessel coronary artery disease. 7. Ascending thoracic aortic aneurysm. Recommend annual imaging followup by CTA or MRA. This recommendation follows 2010 ACCF/AHA/AATS/ACR/ASA/SCA/SCAI/SIR/STS/SVM Guidelines for the Diagnosis and Management of Patients with Thoracic Aortic Disease. Circulation. 2010; 121: T517-O160. Aortic aneurysm NOS (ICD10-I71.9) Aortic Atherosclerosis (ICD10-I70.0). Electronically Signed   By: Zetta Bills M.D.   On: 10/26/2021 10:31    Microbiology: Results for orders placed or performed during the hospital encounter of 10/26/21  Resp Panel by RT-PCR (Flu A&B, Covid) Nasopharyngeal Swab     Status: None   Collection Time: 10/26/21  9:20 AM   Specimen: Nasopharyngeal Swab; Nasopharyngeal(NP) swabs in vial transport medium  Result Value Ref Range Status   SARS Coronavirus 2 by RT PCR NEGATIVE NEGATIVE Final    Comment: (NOTE) SARS-CoV-2 target nucleic acids are NOT DETECTED.  The SARS-CoV-2 RNA is generally detectable in upper respiratory specimens during the acute phase of infection. The lowest concentration of SARS-CoV-2 viral copies this assay can detect is 138 copies/mL. A negative result does not preclude SARS-Cov-2 infection and should not be used as the sole basis for treatment or other patient management decisions. A negative result may occur with  improper specimen collection/handling, submission of specimen other than nasopharyngeal swab, presence of viral mutation(s) within the areas targeted by this assay, and inadequate number of viral copies(<138 copies/mL). A negative result must be combined with clinical observations, patient history, and epidemiological information. The expected result is Negative.  Fact Sheet for Patients:  EntrepreneurPulse.com.au  Fact Sheet for Healthcare  Providers:  IncredibleEmployment.be  This test is no t yet approved or cleared by the [place] FDA and  has been authorized for detection and/or diagnosis of SARS-CoV-2 by FDA under an Emergency Use Authorization (EUA). This EUA will remain  in effect (meaning this test can be used) for the duration of the COVID-19 declaration under Section 564(b)(1) of the Act, 21 U.S.C.section 360bbb-3(b)(1), unless the authorization is terminated  or revoked sooner.       Influenza A by PCR NEGATIVE NEGATIVE Final   Influenza B by PCR NEGATIVE NEGATIVE Final    Comment: (NOTE) The Xpert Xpress SARS-CoV-2/FLU/RSV plus assay is intended as an aid in the diagnosis of influenza from Nasopharyngeal swab specimens and should not be used as a sole basis for treatment. Nasal washings and aspirates are unacceptable for Xpert Xpress SARS-CoV-2/FLU/RSV testing.  Fact Sheet for Patients: EntrepreneurPulse.com.au  Fact Sheet for Healthcare Providers: IncredibleEmployment.be  This test is not yet approved or cleared by the [place] FDA and has been authorized for detection and/or diagnosis of SARS-CoV-2 by FDA under an Emergency Use Authorization (EUA). This EUA will remain in effect (meaning this test can be used) for the duration of the COVID-19 declaration under Section 564(b)(1) of the Act, 21 U.S.C. section 360bbb-3(b)(1), unless the authorization is terminated or revoked.  Performed at [facility], [place] [address], [place], [place] [address]   C Difficile Quick Screen w PCR reflex     Status: None   Collection Time: 10/27/21  7:24 PM   Specimen: STOOL  Result Value Ref Range Status   C Diff antigen NEGATIVE NEGATIVE Final   C Diff toxin NEGATIVE NEGATIVE Final   C Diff interpretation No C. difficile detected.  Final    Comment: Performed at [facility], [place] [address] [place], [place] [address]  Gastrointestinal  Panel by PCR , Stool     Status: None   Collection Time: 10/27/21  7:24 PM   Specimen: STOOL  Result Value Ref Range Status   Campylobacter species NOT DETECTED NOT DETECTED Final   Plesimonas shigelloides NOT DETECTED NOT DETECTED Final   Salmonella species NOT DETECTED NOT DETECTED Final   Yersinia enterocolitica NOT DETECTED NOT DETECTED Final   Vibrio species NOT DETECTED NOT DETECTED Final   Vibrio cholerae NOT DETECTED NOT DETECTED Final   Enteroaggregative E coli (EAEC) NOT DETECTED NOT DETECTED Final   Enteropathogenic E coli (EPEC) NOT DETECTED NOT DETECTED Final   Enterotoxigenic E coli (ETEC) NOT DETECTED NOT DETECTED Final   Shiga like toxin producing E coli (STEC) NOT DETECTED NOT DETECTED Final   Shigella/Enteroinvasive E coli (EIEC) NOT DETECTED NOT DETECTED Final   Cryptosporidium NOT DETECTED NOT DETECTED Final   Cyclospora cayetanensis NOT DETECTED NOT DETECTED Final   Entamoeba histolytica NOT DETECTED NOT DETECTED Final   Giardia lamblia NOT DETECTED NOT DETECTED Final   Adenovirus F40/41 NOT DETECTED NOT DETECTED Final   Astrovirus NOT DETECTED NOT DETECTED Final   Norovirus GI/GII NOT DETECTED NOT DETECTED Final   Rotavirus A NOT DETECTED NOT DETECTED Final   Sapovirus (I, II, IV, and V) NOT DETECTED NOT DETECTED Final    Comment: Performed at [facility], [address] [place], [place] [address]  Culture, blood (routine x 2)     Status: None (Preliminary result)   Collection Time: 10/28/21  9:30 AM   Specimen: BLOOD  Result Value  Ref Range Status   Specimen Description BLOOD RIGHT ANTECUBITAL  Final   Special Requests   Final    BOTTLES DRAWN AEROBIC AND ANAEROBIC Blood Culture adequate volume   Culture   Final    NO GROWTH 2 DAYS Performed at [facility], [address] [place], [place] [address]    Report Status PENDING  Incomplete  Culture, blood (routine x 2)     Status: None (Preliminary result)   Collection Time: 10/28/21  9:34  AM   Specimen: BLOOD  Result Value Ref Range Status   Specimen Description BLOOD LEFT ANTECUBITAL  Final   Special Requests   Final    BOTTLES DRAWN AEROBIC AND ANAEROBIC Blood Culture adequate volume   Culture   Final    NO GROWTH 2 DAYS Performed at [facility], [place] [address] [place], [place] [address]    Report Status PENDING  Incomplete    Labs: CBC: Recent Labs  Lab 10/25/21 1027 10/26/21 0925 10/26/21 0942 10/26/21 1106 10/27/21 0240 10/28/21 0622 10/29/21 0400 10/30/21 0340  WBC 5.0 4.1  --   --  2.5* 1.0* 0.8* 1.7*  NEUTROABS 3.9  --   --   --   --  0.8* 0.6* 1.2*  HGB 10.5* 12.9*   < > 13.3 11.1* 9.4* 9.4* 9.2*  HCT 31.7* 39.7   < > 39.0 32.9* 28.6* 28.1* 28.0*  MCV 99.4 102.1*  --   --  99.7 99.7 99.6 100.0  PLT 133* 114*  --   --  118* 121* 85* 97*   < > = values in this interval not displayed.   Basic Metabolic Panel: Recent Labs  Lab 10/26/21 0925 10/26/21 0942 10/26/21 1106 10/27/21 0240 10/28/21 0622 10/29/21 0400 10/30/21 0340  NA 129* 127* 129* 129* 133* 131* 131*  K 4.2 4.1 3.3* 3.4* 3.5 3.8 3.9  CL 96* 96*  --  98 105 103 101  CO2 21*  --   --  23 23 22 24   GLUCOSE 178* 174*  --  118* 87 105* 99  BUN 16 20  --  16 10 7* 8  CREATININE 1.06 0.80  --  0.74 0.60* 0.66 0.79  CALCIUM 8.5*  --   --  8.2* 8.1* 7.9* 7.9*  MG 1.8  --   --   --  1.9 1.6* 1.7  PHOS  --   --   --   --  1.7* 2.0* 2.1*   Liver Function Tests: Recent Labs  Lab 10/25/21 1027 10/26/21 0925 10/28/21 0622 10/29/21 0400 10/30/21 0340  AST 20 43* 22 21 22   ALT 16 25 20 21 19   ALKPHOS 86 73 61 59 63  BILITOT 0.5 0.8 0.5 0.4 0.5  PROT 6.2* 5.6* 4.6* 4.9* 4.8*  ALBUMIN 3.6 2.9* 2.1* 2.3* 2.2*   CBG: Recent Labs  Lab 10/29/21 1615 10/29/21 2129 10/30/21 0633 10/30/21 1215 10/30/21 1626  GLUCAP 111* 140* 92 101* 95   Discharge time spent: greater than 30 minutes.  Signed: Raiford Noble, DO [facility] 10/30/2021

## 2021-10-31 LAB — PATHOLOGIST SMEAR REVIEW

## 2021-11-02 DIAGNOSIS — E7800 Pure hypercholesterolemia, unspecified: Secondary | ICD-10-CM | POA: Diagnosis not present

## 2021-11-02 DIAGNOSIS — I2510 Atherosclerotic heart disease of native coronary artery without angina pectoris: Secondary | ICD-10-CM | POA: Diagnosis not present

## 2021-11-02 DIAGNOSIS — C159 Malignant neoplasm of esophagus, unspecified: Secondary | ICD-10-CM | POA: Diagnosis not present

## 2021-11-02 DIAGNOSIS — F17210 Nicotine dependence, cigarettes, uncomplicated: Secondary | ICD-10-CM | POA: Diagnosis not present

## 2021-11-02 DIAGNOSIS — Z6837 Body mass index (BMI) 37.0-37.9, adult: Secondary | ICD-10-CM | POA: Diagnosis not present

## 2021-11-02 DIAGNOSIS — Z96651 Presence of right artificial knee joint: Secondary | ICD-10-CM | POA: Diagnosis not present

## 2021-11-02 DIAGNOSIS — I129 Hypertensive chronic kidney disease with stage 1 through stage 4 chronic kidney disease, or unspecified chronic kidney disease: Secondary | ICD-10-CM | POA: Diagnosis not present

## 2021-11-02 DIAGNOSIS — E1122 Type 2 diabetes mellitus with diabetic chronic kidney disease: Secondary | ICD-10-CM | POA: Diagnosis not present

## 2021-11-02 DIAGNOSIS — N181 Chronic kidney disease, stage 1: Secondary | ICD-10-CM | POA: Diagnosis not present

## 2021-11-02 DIAGNOSIS — F324 Major depressive disorder, single episode, in partial remission: Secondary | ICD-10-CM | POA: Diagnosis not present

## 2021-11-02 DIAGNOSIS — M5440 Lumbago with sciatica, unspecified side: Secondary | ICD-10-CM | POA: Diagnosis not present

## 2021-11-02 DIAGNOSIS — G894 Chronic pain syndrome: Secondary | ICD-10-CM | POA: Diagnosis not present

## 2021-11-02 DIAGNOSIS — R1310 Dysphagia, unspecified: Secondary | ICD-10-CM | POA: Diagnosis not present

## 2021-11-02 DIAGNOSIS — J189 Pneumonia, unspecified organism: Secondary | ICD-10-CM | POA: Diagnosis not present

## 2021-11-02 DIAGNOSIS — E440 Moderate protein-calorie malnutrition: Secondary | ICD-10-CM | POA: Diagnosis not present

## 2021-11-02 DIAGNOSIS — G2581 Restless legs syndrome: Secondary | ICD-10-CM | POA: Diagnosis not present

## 2021-11-02 DIAGNOSIS — M1990 Unspecified osteoarthritis, unspecified site: Secondary | ICD-10-CM | POA: Diagnosis not present

## 2021-11-02 DIAGNOSIS — E291 Testicular hypofunction: Secondary | ICD-10-CM | POA: Diagnosis not present

## 2021-11-02 DIAGNOSIS — G4700 Insomnia, unspecified: Secondary | ICD-10-CM | POA: Diagnosis not present

## 2021-11-02 DIAGNOSIS — J441 Chronic obstructive pulmonary disease with (acute) exacerbation: Secondary | ICD-10-CM | POA: Diagnosis not present

## 2021-11-02 DIAGNOSIS — K219 Gastro-esophageal reflux disease without esophagitis: Secondary | ICD-10-CM | POA: Diagnosis not present

## 2021-11-02 DIAGNOSIS — G250 Essential tremor: Secondary | ICD-10-CM | POA: Diagnosis not present

## 2021-11-02 DIAGNOSIS — F439 Reaction to severe stress, unspecified: Secondary | ICD-10-CM | POA: Diagnosis not present

## 2021-11-02 DIAGNOSIS — G4730 Sleep apnea, unspecified: Secondary | ICD-10-CM | POA: Diagnosis not present

## 2021-11-02 LAB — CULTURE, BLOOD (ROUTINE X 2)
Culture: NO GROWTH
Culture: NO GROWTH
Special Requests: ADEQUATE
Special Requests: ADEQUATE

## 2021-11-05 NOTE — Progress Notes (Unsigned)
Cardiology Office Note Date:  11/05/2021  Patient ID:  Justin Trujillo., DOB 1946-08-15, MRN [id number] PCP:  Carol Ada, MD  Electrophysiologist: Dr. Lovena Le  ***refresh   Chief Complaint: *** 8 week visit  History of Present Illness: Justin Trujillo. is a 76 y.o. male with history of DM, HTN, diastolic CHF, esophageal CA, chronic pain (back)  He comes in today to be seen for Dr. Lovena Le, last seen by him 08/01/21, discussed sedentary and has lost weight. He is still smoking. The patient's nausea has improved. He has undergone palliative radiation.  No CP, noted some edema though. Maintaining SR, planned to continue amiodarone 400mg  BID for 5 days then 200mg  BID until an 8 week APP visit Discussed he was an acceptable surgical candidate with possible upcoming back surgery. Longterm prognosis is guarded w/his esophageal cancer.  He was hospitalized 10/26/21 - 11/09/21 with progressive weakness, falls.  Had [place] in [place] and since then feeling poorly, admitted with pneumonia, hypoxia, hypotension, treated with antibiotics and IVF. Falls felt related to illness, poor oral intake, dehydration, chemo, and diarrhea   *** SR? symptoms *** eliquis, bleeding, dose *** volume *** amio dose? labs   AFib hx Diagnosed Nov 2022 Amiodarone started Nov 2022  Past Medical History:  Diagnosis Date   Arthritis    Cancer [facility])    Coronary atherosclerosis of native coronary artery    Depression    Esophageal cancer (HCC)    Essential tremor    High cholesterol    Hypertension    Hypertension    Hypogonadism male    Obstructive chronic bronchitis with exacerbation (HCC)    Obstructive sleep apnea (adult) (pediatric)    no cpap   Other and unspecified hyperlipidemia    Other emphysema ([place])    Proteinuria 2019   has seen a nephrologist   Shortness of breath    with excertion   Situational stress    Type II or unspecified type diabetes mellitus without mention of complication, not  stated as uncontrolled    type 2   Unspecified essential hypertension     Past Surgical History:  Procedure Laterality Date   CARDIAC CATHETERIZATION     IR IMAGING GUIDED PORT INSERTION  04/24/2021   IR KYPHO LUMBAR INC FX REDUCE BONE BX UNI/BIL CANNULATION INC/IMAGING  06/13/2018   L lens implant     R knee replacement x2     REVERSE SHOULDER ARTHROPLASTY Left 03/26/2019   Procedure: REVERSE SHOULDER ARTHROPLASTY;  Surgeon: Justice Britain, MD;  Location: [facility];  Service: Orthopedics;  Laterality: Left;  187min   TONSILLECTOMY     TONSILLECTOMY AND ADENOIDECTOMY     vascectomy      Current Outpatient Medications  Medication Sig Dispense Refill   acetaminophen (TYLENOL) 325 MG tablet Take 2 tablets (650 mg total) by mouth every 6 (six) hours as needed for mild pain (or Fever >/= 101). 20 tablet 0   acetaminophen (TYLENOL) 650 MG CR tablet Take 650 mg by mouth every 6 (six) hours. Take with dilaudid, gabapentin and tizanidine - scheduled     albuterol (VENTOLIN HFA) 108 (90 Base) MCG/ACT inhaler Inhale 1 puff into the lungs every 6 (six) hours as needed for wheezing.     ALPRAZolam (XANAX) 0.5 MG tablet Take 0.5 mg by mouth 2 (two) times daily.     amiodarone (PACERONE) 200 MG tablet Take 2 tablets (400 mg total) by mouth 2 (two) times daily for 7 days, THEN 1  tablet (200 mg total) daily. (Patient taking differently: Take one tablet (200 mg) by mouth every morning) 90 tablet 3   apixaban (ELIQUIS) 5 MG TABS tablet Take 1 tablet (5 mg total) by mouth 2 (two) times daily. 60 tablet 2   atorvastatin (LIPITOR) 40 MG tablet Take 1 tablet (40 mg total) by mouth daily. (Patient taking differently: Take 40 mg by mouth at bedtime.) 30 tablet 0   Cholecalciferol (VITAMIN D) 50 MCG (2000 UT) tablet Take 2,000 Units by mouth every morning.     escitalopram (LEXAPRO) 10 MG tablet Take 10 mg by mouth every morning.     feeding supplement, GLUCERNA SHAKE, (GLUCERNA SHAKE) LIQD Take 237 mLs by mouth 3  (three) times daily between meals. 2370 mL 0   gabapentin (NEURONTIN) 300 MG capsule Take 300 mg by mouth every 6 (six) hours. Take with tylenol, dilaudid, and tizanidine - scheduled     HYDROmorphone (DILAUDID) 8 MG tablet Take 8 mg by mouth every 6 (six) hours. Take with tylenol, gabapentin and tizanidine - scheduled     magnesium oxide (MAG-OX) 400 (240 Mg) MG tablet Take 1 tablet (400 mg total) by mouth daily. 30 tablet 0   Multiple Vitamin (MULTIVITAMIN WITH MINERALS) TABS tablet Take 1 tablet by mouth daily with lunch. Centrum Silver     MYRBETRIQ 50 MG TB24 tablet Take 50 mg by mouth at bedtime.     ondansetron (ZOFRAN ODT) 4 MG disintegrating tablet Take 1 tablet (4 mg total) by mouth every 8 (eight) hours as needed for nausea or vomiting. 20 tablet 0   ondansetron (ZOFRAN) 4 MG tablet Take 1 tablet (4 mg total) by mouth every 6 (six) hours as needed for nausea. 20 tablet 0   primidone (MYSOLINE) 50 MG tablet Take 1 tablet (50 mg total) by mouth in the morning and at bedtime. 180 tablet 2   rOPINIRole (REQUIP) 0.5 MG tablet Take 0.5 mg by mouth at bedtime.     Semaglutide,0.25 or 0.5MG /DOS, (OZEMPIC, 0.25 OR 0.5 MG/DOSE,) 2 MG/1.5ML SOPN Inject 0.5 mg into the skin every Wednesday.     sodium chloride (OCEAN) 0.65 % SOLN nasal spray Place 1 spray into both nostrils as needed for congestion. 44 mL 0   sucralfate (CARAFATE) 1 GM/10ML suspension Take 10 mLs (1 g total) by mouth 4 (four) times daily -  with meals and at bedtime. (Patient not taking: Reported on 09/12/2021) 420 mL 2   tamsulosin (FLOMAX) 0.4 MG CAPS capsule Take 0.4 mg by mouth 2 (two) times daily.     testosterone cypionate (DEPOTESTOSTERONE CYPIONATE) 200 MG/ML injection Inject 200 mg into the muscle every 14 (fourteen) days.     tiZANidine (ZANAFLEX) 4 MG tablet Take 4 mg by mouth every 6 (six) hours. Take with tylenol, dilaudid and gabapentin - scheduled     trolamine salicylate (ASPERCREME) 10 % cream Apply 1 application  topically 2 (two) times daily.     No current facility-administered medications for this visit.    Allergies:   Iodinated contrast media, Moxifloxacin, Penicillins, Shellfish-derived products, Amoxicillin, Chlorhexidine, and Latex   Social History:  The patient  reports that he has been smoking cigarettes. He has a 55.00 pack-year smoking history. He has never used smokeless tobacco. He reports current alcohol use of about 1.0 standard drink per week. He reports that he does not use drugs.   Family History:  The patient's family history includes Clotting disorder in his mother; Emphysema in his father; Heart disease in  his father; Kidney cancer in his child.  ROS:  Please see the history of present illness.    All other systems are reviewed and otherwise negative.   PHYSICAL EXAM:  VS:  There were no vitals taken for this visit. BMI: There is no height or weight on file to calculate BMI. Well nourished, well developed, in no acute distress HEENT: normocephalic, atraumatic Neck: no JVD, carotid bruits or masses Cardiac:  *** RRR; no significant murmurs, no rubs, or gallops Lungs:  *** CTA b/l, no wheezing, rhonchi or rales Abd: soft, nontender MS: no deformity or *** atrophy Ext: *** no edema Skin: warm and dry, no rash Neuro:  No gross deficits appreciated Psych: euthymic mood, full affect    EKG:  Done today and reviewed by myself shows  ***  07/19/21: TTE IMPRESSIONS   1. LV function is hyperdynamic EF 65-70%; Normal basal and mid segments.  However the apex is akinetic indicating a possible stress cardiomyopathy  vs. distal LAD disease. No LV thrombus. Recommend EKG and serial troponin  levels.   FINDINGS   Left Ventricle: LV function is hyperdynamic EF 65-70%; Normal basal and  mid segments. However the apex is akinetic indicating a possible stress  cardiomyopathy vs. distal LAD disease. No LV thrombus. Recommend EKG and  serial troponin levels.    01/25/21: stress  myoview The left ventricular ejection fraction is mildly decreased (45-54%). Nuclear stress EF: 51%. There was no ST segment deviation noted during stress. The study is normal. This is a low risk study.   Low risk stress nuclear study with normal perfusion and low normal left ventricular global systolic function. The previously described LV apical defect is no longer seen. LVEF has improved slightly.   By review of record, cardiac catheterization in 2007 which showed nonobstructive CAD  Recent Labs: 10/25/2021: TSH 0.935 10/26/2021: B Natriuretic Peptide 214.5 10/30/2021: ALT 19; BUN 8; Creatinine, Ser 0.79; Hemoglobin 9.2; Magnesium 1.7; Platelets 97; Potassium 3.9; Sodium 131  No results found for requested labs within last 8760 hours.   Estimated Creatinine Clearance: 68.4 mL/min (by C-G formula based on SCr of 0.79 mg/dL).   Wt Readings from Last 3 Encounters:  10/30/21 151 lb 3.8 oz (68.6 kg)  10/25/21 158 lb 6.4 oz (71.8 kg)  10/11/21 167 lb 4 oz (75.9 kg)     Other studies reviewed: Additional studies/records reviewed today include: summarized above  ASSESSMENT AND PLAN:  Paroxysmal Afib CHA2DS2Vasc is 5, on Eliquis, appropriately dosed *** amiodarone *** % burden by symptoms  HTN ***  Chronic CHF (diastolic) ***    Disposition: F/u with ***  Current medicines are reviewed at length with the patient today.  The patient did not have any concerns regarding medicines.  Venetia Night, PA-C 11/05/2021 1:09 PM     [place] [address] [place] [address] [phone] (office)  [phone] (fax)

## 2021-11-07 ENCOUNTER — Inpatient Hospital Stay: Payer: PPO | Admitting: Physician Assistant

## 2021-11-07 ENCOUNTER — Other Ambulatory Visit: Payer: Self-pay

## 2021-11-07 ENCOUNTER — Inpatient Hospital Stay: Payer: PPO

## 2021-11-07 ENCOUNTER — Inpatient Hospital Stay: Payer: PPO | Admitting: Hematology and Oncology

## 2021-11-07 ENCOUNTER — Other Ambulatory Visit: Payer: Self-pay | Admitting: Hematology and Oncology

## 2021-11-07 VITALS — BP 112/75 | HR 76 | Temp 96.9°F | Resp 17 | Wt 156.2 lb

## 2021-11-07 DIAGNOSIS — Z8616 Personal history of COVID-19: Secondary | ICD-10-CM | POA: Diagnosis not present

## 2021-11-07 DIAGNOSIS — R1310 Dysphagia, unspecified: Secondary | ICD-10-CM | POA: Diagnosis not present

## 2021-11-07 DIAGNOSIS — C792 Secondary malignant neoplasm of skin: Secondary | ICD-10-CM | POA: Diagnosis not present

## 2021-11-07 DIAGNOSIS — G4733 Obstructive sleep apnea (adult) (pediatric): Secondary | ICD-10-CM | POA: Diagnosis not present

## 2021-11-07 DIAGNOSIS — I119 Hypertensive heart disease without heart failure: Secondary | ICD-10-CM | POA: Diagnosis not present

## 2021-11-07 DIAGNOSIS — J449 Chronic obstructive pulmonary disease, unspecified: Secondary | ICD-10-CM | POA: Diagnosis not present

## 2021-11-07 DIAGNOSIS — E7800 Pure hypercholesterolemia, unspecified: Secondary | ICD-10-CM | POA: Insufficient documentation

## 2021-11-07 DIAGNOSIS — Z79899 Other long term (current) drug therapy: Secondary | ICD-10-CM | POA: Insufficient documentation

## 2021-11-07 DIAGNOSIS — E1136 Type 2 diabetes mellitus with diabetic cataract: Secondary | ICD-10-CM | POA: Diagnosis not present

## 2021-11-07 DIAGNOSIS — C155 Malignant neoplasm of lower third of esophagus: Secondary | ICD-10-CM | POA: Insufficient documentation

## 2021-11-07 DIAGNOSIS — C159 Malignant neoplasm of esophagus, unspecified: Secondary | ICD-10-CM | POA: Diagnosis not present

## 2021-11-07 DIAGNOSIS — M549 Dorsalgia, unspecified: Secondary | ICD-10-CM | POA: Diagnosis not present

## 2021-11-07 DIAGNOSIS — R1319 Other dysphagia: Secondary | ICD-10-CM

## 2021-11-07 DIAGNOSIS — Z923 Personal history of irradiation: Secondary | ICD-10-CM | POA: Diagnosis not present

## 2021-11-07 DIAGNOSIS — I2510 Atherosclerotic heart disease of native coronary artery without angina pectoris: Secondary | ICD-10-CM | POA: Insufficient documentation

## 2021-11-07 DIAGNOSIS — Z9221 Personal history of antineoplastic chemotherapy: Secondary | ICD-10-CM | POA: Insufficient documentation

## 2021-11-07 DIAGNOSIS — E291 Testicular hypofunction: Secondary | ICD-10-CM | POA: Insufficient documentation

## 2021-11-07 DIAGNOSIS — Z95828 Presence of other vascular implants and grafts: Secondary | ICD-10-CM

## 2021-11-07 DIAGNOSIS — Z79890 Hormone replacement therapy (postmenopausal): Secondary | ICD-10-CM | POA: Insufficient documentation

## 2021-11-07 DIAGNOSIS — E785 Hyperlipidemia, unspecified: Secondary | ICD-10-CM | POA: Diagnosis not present

## 2021-11-07 DIAGNOSIS — R110 Nausea: Secondary | ICD-10-CM | POA: Insufficient documentation

## 2021-11-07 LAB — CBC WITH DIFFERENTIAL (CANCER CENTER ONLY)
Abs Immature Granulocytes: 0.03 10*3/uL (ref 0.00–0.07)
Basophils Absolute: 0 10*3/uL (ref 0.0–0.1)
Basophils Relative: 2 %
Eosinophils Absolute: 0.1 10*3/uL (ref 0.0–0.5)
Eosinophils Relative: 3 %
HCT: 28.6 % — ABNORMAL LOW (ref 39.0–52.0)
Hemoglobin: 9.5 g/dL — ABNORMAL LOW (ref 13.0–17.0)
Immature Granulocytes: 2 %
Lymphocytes Relative: 34 %
Lymphs Abs: 0.6 10*3/uL — ABNORMAL LOW (ref 0.7–4.0)
MCH: 32.8 pg (ref 26.0–34.0)
MCHC: 33.2 g/dL (ref 30.0–36.0)
MCV: 98.6 fL (ref 80.0–100.0)
Monocytes Absolute: 0.3 10*3/uL (ref 0.1–1.0)
Monocytes Relative: 17 %
Neutro Abs: 0.8 10*3/uL — ABNORMAL LOW (ref 1.7–7.7)
Neutrophils Relative %: 42 %
Platelet Count: 105 10*3/uL — ABNORMAL LOW (ref 150–400)
RBC: 2.9 MIL/uL — ABNORMAL LOW (ref 4.22–5.81)
RDW: 17.1 % — ABNORMAL HIGH (ref 11.5–15.5)
Smear Review: NORMAL
WBC Count: 1.9 10*3/uL — ABNORMAL LOW (ref 4.0–10.5)
nRBC: 0 % (ref 0.0–0.2)

## 2021-11-07 LAB — CMP (CANCER CENTER ONLY)
ALT: 16 U/L (ref 0–44)
AST: 18 U/L (ref 15–41)
Albumin: 3.1 g/dL — ABNORMAL LOW (ref 3.5–5.0)
Alkaline Phosphatase: 99 U/L (ref 38–126)
Anion gap: 4 — ABNORMAL LOW (ref 5–15)
BUN: 12 mg/dL (ref 8–23)
CO2: 28 mmol/L (ref 22–32)
Calcium: 8.8 mg/dL — ABNORMAL LOW (ref 8.9–10.3)
Chloride: 101 mmol/L (ref 98–111)
Creatinine: 0.58 mg/dL — ABNORMAL LOW (ref 0.61–1.24)
GFR, Estimated: >60 mL/min (ref >60)
Glucose, Bld: 70 mg/dL (ref 70–99)
Potassium: 3.3 mmol/L — ABNORMAL LOW (ref 3.5–5.1)
Sodium: 133 mmol/L — ABNORMAL LOW (ref 135–145)
Total Bilirubin: 0.4 mg/dL (ref 0.3–1.2)
Total Protein: 5.8 g/dL — ABNORMAL LOW (ref 6.5–8.1)

## 2021-11-07 LAB — LACTATE DEHYDROGENASE: LDH: 175 U/L (ref 98–192)

## 2021-11-07 MED ORDER — HEPARIN SOD (PORK) LOCK FLUSH 100 UNIT/ML IV SOLN
500.0000 [IU] | Freq: Once | INTRAVENOUS | Status: AC
  Administered 2021-11-07: 500 [IU]

## 2021-11-07 MED ORDER — SODIUM CHLORIDE 0.9% FLUSH
10.0000 mL | Freq: Once | INTRAVENOUS | Status: AC
  Administered 2021-11-07: 10 mL

## 2021-11-07 NOTE — Progress Notes (Signed)
Justin Trujillo   [phone]  PROGRESS NOTE  Patient Care Team: Carol Ada, MD as PCP - General (Family Medicine) Evans Lance, MD as PCP - Cardiology (Cardiology) Orson Slick, MD as Consulting Physician (Hematology and Oncology)  Hematological/Oncological History # Metastatic Adenocarcinoma of the Distal Esophagus 01/27/2021: dermatology performed a punch biopsy of the right superior lateral anterior neck. Finding show adenocarcinoma negative for PSA and cytokeratin 20.  02/06/2021: establish care with Dr. Lorenso Trujillo  02/15/2021: EGD found esophageal mass. Biopsy confirms poorly differentiated carcinoma 02/22/2021: PET CT scan shows FDG avid mass in distal esophagus and subcutaneous tissue or right neck.  03/22/2021-04/05/2021: palliative radiation to the esophageal mass.  04/26/2021: Cycle 1 Day 1 of FOLFOX + Nivolumab 05/10/2021: Cycle 2 Day 1 of FOLFOX + Nivolumab 05/24/2021: Cycle 3 Day 1 of FOLFOX + Nivolumab 06/07/2021: Cycle 4 Day 1 of FOLFOX + Nivolumab 06/23/2021: Cycle 5 Day 1 of FOLFOX + Nivolumab 07/05/2021: Cycle 6 Day 1 of FOLFOX + Nivolumab 08/15/2021: Cycle 7 Day 1 of FOLFOX + Nivolumab (delayed due to hospitalization for N/V and dehydration) 08/29/2021: Cycle 8 Day 1 of FOLFOX + Nivolumab  09/12/2021: Cycle 9 Day 1 of FOLFOX + Nivolumab  09/26/2021: cycle delayed due to COVID infection.  10/11/2021: Cycle 10 Day 1 of FOLFOX + Nivolumab  10/20/2021: CT C/A/P showed residual distal esophageal wall thickening. No definitive evidence metastatic disease 10/25/2021: Cycle 11 Day 1 of FOLFOX + Nivolumab  11/07/2021: HOLD Cycle 12 due to recent fall, neutropenia/anemia  Interval History:  Justin Trujillo. 76 y.o. male with medical history significant for metastatic esophageal adenocarcinoma who presents for a follow up visit. The patient's last visit was on 10/25/2021. In the interim, Justin Trujillo was completed cycle 11 of treatment.  Interim  since his last visit he had a hospitalization for a traumatic fall.   On exam today Justin Trujillo reports he is unsure what caused his fall.  He does not member immediately before the fall and only remembers being taken out of his house on a gurney.  He notes that he thinks it may be related to his blood pressure.  He notes that his pain is currently under reasonably good control.  He unfortunately developed wounds on his arms during the fall which is currently covered by bandages.  He notes he has a wound care nurse in his neighborhood who he may ask for help.  He reports that he has not had any other further issues with lightheadedness, dizziness, or falls.  He denies any nausea, vomiting, or diarrhea.  His energy levels are quite good.  He denies fevers, chills, night sweats, chest pain, cough, mucositis or rash. He has no other complaints. Rest of the 10 point ROS is below.  MEDICAL HISTORY:  Past Medical History:  Diagnosis Date   Arthritis    Cancer ([place])    Coronary atherosclerosis of native coronary artery    Depression    Esophageal cancer (HCC)    Essential tremor    High cholesterol    Hypertension    Hypertension    Hypogonadism male    Obstructive chronic bronchitis with exacerbation (HCC)    Obstructive sleep apnea (adult) (pediatric)    no cpap   Other and unspecified hyperlipidemia    Other emphysema ([place])    Proteinuria 2019   has seen a nephrologist   Shortness of breath    with excertion   Situational stress  Type II or unspecified type diabetes mellitus without mention of complication, not stated as uncontrolled    type 2   Unspecified essential hypertension     SURGICAL HISTORY: Past Surgical History:  Procedure Laterality Date   CARDIAC CATHETERIZATION     IR IMAGING GUIDED PORT INSERTION  04/24/2021   IR KYPHO LUMBAR INC FX REDUCE BONE BX UNI/BIL CANNULATION INC/IMAGING  06/13/2018   L lens implant     R knee replacement x2     REVERSE SHOULDER ARTHROPLASTY  Left 03/26/2019   Procedure: REVERSE SHOULDER ARTHROPLASTY;  Surgeon: Justice Britain, MD;  Location: [facility];  Service: Orthopedics;  Laterality: Left;  167mn   TONSILLECTOMY     TONSILLECTOMY AND ADENOIDECTOMY     vascectomy      SOCIAL HISTORY: Social History   Socioeconomic History   Marital status: Married    Spouse name: Not on file   Number of children: Not on file   Years of education: Not on file   Highest education level: Not on file  Occupational History   Occupation: lLandscape architect Tobacco Use   Smoking status: Every Day    Packs/day: 1.00    Years: 55.00    Pack years: 55.00    Types: Cigarettes   Smokeless tobacco: Never  Vaping Use   Vaping Use: Never used  Substance and Sexual Activity   Alcohol use: Yes    Alcohol/week: 1.0 standard drink    Types: 1 Cans of beer per week    Comment: occasional beer   Drug use: Never   Sexual activity: Not Currently  Other Topics Concern   Not on file  Social History Narrative   ** Merged History Encounter **       Social Determinants of Health   Financial Resource Strain: Not on file  Food Insecurity: Not on file  Transportation Needs: Not on file  Physical Activity: Not on file  Stress: Not on file  Social Connections: Not on file  Intimate Partner Violence: Not on file    FAMILY HISTORY: Family History  Problem Relation Age of Onset   Emphysema Father    Heart disease Father    Clotting disorder Mother    Kidney cancer Child     ALLERGIES:  is allergic to iodinated contrast media, moxifloxacin, penicillins, shellfish-derived products, amoxicillin, chlorhexidine, and latex.  MEDICATIONS:  Current Outpatient Medications  Medication Sig Dispense Refill   acetaminophen (TYLENOL) 650 MG CR tablet Take 650 mg by mouth every 6 (six) hours. Take with dilaudid, gabapentin and tizanidine - scheduled     albuterol (VENTOLIN HFA) 108 (90 Base) MCG/ACT inhaler Inhale 1 puff into the lungs every 6  (six) hours as needed for wheezing.     ALPRAZolam (XANAX) 0.5 MG tablet Take 0.5 mg by mouth 2 (two) times daily.     amiodarone (PACERONE) 200 MG tablet Take 2 tablets (400 mg total) by mouth 2 (two) times daily for 7 days, THEN 1 tablet (200 mg total) daily. (Patient taking differently: Take one tablet (200 mg) by mouth every morning) 90 tablet 3   apixaban (ELIQUIS) 5 MG TABS tablet Take 1 tablet (5 mg total) by mouth 2 (two) times daily. 60 tablet 2   atorvastatin (LIPITOR) 40 MG tablet Take 1 tablet (40 mg total) by mouth daily. (Patient taking differently: Take 40 mg by mouth at bedtime.) 30 tablet 0   Cholecalciferol (VITAMIN D) 50 MCG (2000 UT) tablet Take 2,000 Units by mouth every  morning.     escitalopram (LEXAPRO) 10 MG tablet Take 10 mg by mouth every morning.     gabapentin (NEURONTIN) 300 MG capsule Take 300 mg by mouth every 6 (six) hours. Take with tylenol, dilaudid, and tizanidine - scheduled     HYDROmorphone (DILAUDID) 8 MG tablet Take 8 mg by mouth every 6 (six) hours. Take with tylenol, gabapentin and tizanidine - scheduled     Multiple Vitamin (MULTIVITAMIN WITH MINERALS) TABS tablet Take 1 tablet by mouth daily with lunch. Centrum Silver     MYRBETRIQ 50 MG TB24 tablet Take 50 mg by mouth at bedtime.     primidone (MYSOLINE) 50 MG tablet Take 1 tablet (50 mg total) by mouth in the morning and at bedtime. 180 tablet 2   rOPINIRole (REQUIP) 0.5 MG tablet Take 0.5 mg by mouth at bedtime.     Semaglutide,0.25 or 0.5MG/DOS, (OZEMPIC, 0.25 OR 0.5 MG/DOSE,) 2 MG/1.5ML SOPN Inject 0.5 mg into the skin every Wednesday.     tamsulosin (FLOMAX) 0.4 MG CAPS capsule Take 0.4 mg by mouth 2 (two) times daily.     tiZANidine (ZANAFLEX) 4 MG tablet Take 4 mg by mouth every 6 (six) hours. Take with tylenol, dilaudid and gabapentin - scheduled     trolamine salicylate (ASPERCREME) 10 % cream Apply 1 application topically 2 (two) times daily.     acetaminophen (TYLENOL) 325 MG tablet Take 2  tablets (650 mg total) by mouth every 6 (six) hours as needed for mild pain (or Fever >/= 101). (Patient not taking: Reported on 11/07/2021) 20 tablet 0   feeding supplement, GLUCERNA SHAKE, (GLUCERNA SHAKE) LIQD Take 237 mLs by mouth 3 (three) times daily between meals. 2370 mL 0   magnesium oxide (MAG-OX) 400 (240 Mg) MG tablet Take 1 tablet (400 mg total) by mouth daily. (Patient not taking: Reported on 11/07/2021) 30 tablet 0   ondansetron (ZOFRAN ODT) 4 MG disintegrating tablet Take 1 tablet (4 mg total) by mouth every 8 (eight) hours as needed for nausea or vomiting. (Patient not taking: Reported on 11/07/2021) 20 tablet 0   ondansetron (ZOFRAN) 4 MG tablet Take 1 tablet (4 mg total) by mouth every 6 (six) hours as needed for nausea. (Patient not taking: Reported on 11/07/2021) 20 tablet 0   sodium chloride (OCEAN) 0.65 % SOLN nasal spray Place 1 spray into both nostrils as needed for congestion. (Patient not taking: Reported on 11/07/2021) 44 mL 0   sucralfate (CARAFATE) 1 GM/10ML suspension Take 10 mLs (1 g total) by mouth 4 (four) times daily -  with meals and at bedtime. (Patient not taking: Reported on 09/12/2021) 420 mL 2   testosterone cypionate (DEPOTESTOSTERONE CYPIONATE) 200 MG/ML injection Inject 200 mg into the muscle every 14 (fourteen) days. (Patient not taking: Reported on 11/07/2021)     No current facility-administered medications for this visit.    REVIEW OF SYSTEMS:   Constitutional: ( - ) fevers, ( - )  chills , ( - ) night sweats Eyes: ( - ) blurriness of vision, ( - ) double vision, ( - ) watery eyes Ears, nose, mouth, throat, and face: ( - ) mucositis, ( - ) sore throat Respiratory: ( - ) cough, ( - ) dyspnea, ( - ) wheezes Cardiovascular: ( - ) palpitation, ( - ) chest discomfort, ( + ) lower extremity swelling Gastrointestinal:  ( - ) nausea, ( - ) heartburn, ( - ) change in bowel habits Skin: ( - ) abnormal skin rashes  Lymphatics: ( - ) new lymphadenopathy, ( - ) easy  bruising Neurological: ( - ) numbness, ( - ) tingling, ( - ) new weaknesses Behavioral/Psych: ( - ) mood change, ( - ) new changes  All other systems were reviewed with the patient and are negative.  PHYSICAL EXAMINATION: ECOG PERFORMANCE STATUS: 1 - Symptomatic but completely ambulatory  Vitals:   11/07/21 1051  BP: 112/75  Pulse: 76  Resp: 17  Temp: (!) 96.9 F (36.1 C)  SpO2: 96%   Filed Weights   11/07/21 1051  Weight: 156 lb 4 oz (70.9 kg)    GENERAL: Well-appearing elderly Caucasian male, alert, no distress and comfortable SKIN: skin color, texture, turgor are normal, no rashes or significant lesions EYES: conjunctiva are pink and non-injected, sclera clear LUNGS: clear to auscultation and percussion with normal breathing effort HEART: regular rate & rhythm and no murmurs. Mild lower extremity edema, improved.  PSYCH: alert & oriented x 3, fluent speech NEURO: no focal motor/sensory deficits  LABORATORY DATA:  I have reviewed the data as listed CBC Latest Ref Rng & Units 11/07/2021 10/30/2021 10/29/2021  WBC 4.0 - 10.5 K/uL 1.9(L) 1.7(L) 0.8(LL)  Hemoglobin 13.0 - 17.0 g/dL 9.5(L) 9.2(L) 9.4(L)  Hematocrit 39.0 - 52.0 % 28.6(L) 28.0(L) 28.1(L)  Platelets 150 - 400 K/uL 105(L) 97(L) 85(L)    CMP Latest Ref Rng & Units 11/07/2021 10/30/2021 10/29/2021  Glucose 70 - 99 mg/dL 70 99 105(H)  BUN 8 - 23 mg/dL 12 8 7(L)  Creatinine 0.61 - 1.24 mg/dL 0.58(L) 0.79 0.66  Sodium 135 - 145 mmol/L 133(L) 131(L) 131(L)  Potassium 3.5 - 5.1 mmol/L 3.3(L) 3.9 3.8  Chloride 98 - 111 mmol/L 101 101 103  CO2 22 - 32 mmol/L _0 Calcium 8.9 - 10.3 mg/dL 8.8(L) 7.9(L) 7.9(L)  Total Protein 6.5 - 8.1 g/dL 5.8(L) 4.8(L) 4.9(L)  Total Bilirubin 0.3 - 1.2 mg/dL 0.4 0.5 0.4  Alkaline Phos 38 - 126 U/L 99 63 59  AST 15 - 41 U/L _1 ALT 0 - 44 U/L _2 RADIOGRAPHIC STUDIES: I have personally reviewed the radiological images as listed and agreed with the findings in the  report: FDG avid distal esophagus as well as subcutaneous none lymph node region in the right neck. CT Abdomen Pelvis Wo Contrast  Result Date: 10/23/2021 CLINICAL DATA:  Esophageal cancer, assess treatment response, chemotherapy in progress, radiation therapy complete. Shortness of breath on exertion and cough. EXAM: CT CHEST, ABDOMEN AND PELVIS WITHOUT CONTRAST TECHNIQUE: Multidetector CT imaging of the chest, abdomen and pelvis was performed following the standard protocol without IV contrast. RADIATION DOSE REDUCTION: This exam was performed according to the departmental dose-optimization program which includes automated exposure control, adjustment of the mA and/or kV according to patient size and/or use of iterative reconstruction technique. COMPARISON:  07/13/2021. FINDINGS: CT CHEST FINDINGS Cardiovascular: Right IJ Port-A-Cath terminates in the low SVC. Atherosclerotic calcification of the aorta, aortic valve and coronary arteries. Ascending aorta measures up to 4.3 cm. Heart is at the upper limits of normal in size to mildly enlarged. Dense mitral annulus calcification. No pericardial effusion. Mediastinum/Nodes: No pathologically enlarged mediastinal, periesophageal or axillary lymph nodes. Hilar regions are difficult to definitively evaluate without IV contrast but appear grossly unremarkable. Slight distal esophageal wall thickening. Lungs/Pleura: Subpleural reticular densities and ground-glass may be minimally progressive. Associated traction bronchiolectasis. 4 mm peripheral left lower lobe nodule (6/117), new. No pleural fluid. Minimal debris in  the airway. Musculoskeletal: Degenerative changes in the spine. Bilateral shoulder arthroplasties. T11 and T12 compression fractures are new from 07/13/2021. No worrisome lytic or sclerotic lesions. CT ABDOMEN PELVIS FINDINGS Hepatobiliary: Liver and gallbladder are unremarkable. No biliary ductal dilatation. Pancreas: Negative. Spleen: Negative.  Adrenals/Urinary Tract: Right adrenal gland is unremarkable. There may be slight nodular thickening of the left adrenal gland. Kidneys are unremarkable. Ureters are decompressed. Ventral bladder wall thickening. Stomach/Bowel: Stomach, small bowel and appendix are unremarkable. A fair amount of stool in colon is indicative of constipation. Vascular/Lymphatic: Atherosclerotic calcification of the aorta. No pathologically enlarged lymph nodes. Reproductive: Prostate is mildly prominent. Other: No free fluid. Mesenteries and peritoneum are otherwise unremarkable. Musculoskeletal: Degenerative and postoperative changes in the spine. Dextroconvex scoliosis of the lumbar spine. IMPRESSION: 1. Residual distal esophageal wall thickening. No definitive evidence metastatic disease. 2. 4 mm peripheral left lower lobe nodule may be new. Recommend attention on follow-up. 3. Subpleural reticular densities and ground-glass may be minimally progressive. Associated traction bronchiolectasis. Findings may reflect nonspecific interstitial pneumonitis or usual interstitial pneumonitis and could be treatment related. 4. Ascending aortic aneurysm. Recommend annual imaging followup by CTA or MRA. This recommendation follows 2010 ACCF/AHA/AATS/ACR/ASA/SCA/SCAI/SIR/STS/SVM Guidelines for the Diagnosis and Management of Patients with Thoracic Aortic Disease. Circulation. 2010; 121: G254-Y706. Aortic aneurysm NOS (ICD10-I71.9). 5. Mild prostate prominence with ventral bladder wall thickening, indicative of an element outlet obstruction. 6. New T11 and T12 compression fractures. 7. Aortic atherosclerosis (ICD10-I70.0). Coronary artery calcification. Electronically Signed   By: Lorin Picket M.D.   On: 10/23/2021 13:10   CT HEAD WO CONTRAST  Result Date: 10/26/2021 CLINICAL DATA:  Moderate to severe head trauma EXAM: CT HEAD WITHOUT CONTRAST CT CERVICAL SPINE WITHOUT CONTRAST TECHNIQUE: Multidetector CT imaging of the head and cervical  spine was performed following the standard protocol without intravenous contrast. Multiplanar CT image reconstructions of the cervical spine were also generated. RADIATION DOSE REDUCTION: This exam was performed according to the departmental dose-optimization program which includes automated exposure control, adjustment of the mA and/or kV according to patient size and/or use of iterative reconstruction technique. COMPARISON:  None. FINDINGS: CT HEAD FINDINGS Brain: No evidence of acute infarction, hemorrhage, hydrocephalus, extra-axial collection or mass lesion/mass effect. 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 Cerebral volume loss, central predominant. Small, incidental calcification in the inter hemispheric fissure. Vascular: No hyperdense vessel or unexpected calcification. Skull: No acute fracture. Sinuses/Orbits: Bilateral cataract resection. Fluid levels that are low-density in the left maxillary and sphenoid sinuses. Left mastoid and middle ear opacification that is subtotal. CT CERVICAL SPINE FINDINGS Alignment: Reversal of cervical lordosis. Skull base and vertebrae: Streak artifact intermittent motion affects visualization, streak artifact heavily affecting the C1 and C2 levels. No convincing fracture. Generalized osteopenia. Soft tissues and spinal canal: No prevertebral fluid or swelling. No visible canal hematoma. Disc levels: Generalized degenerative disc narrowing and ridging. Multilevel facet spurring. Upper chest: Negative IMPRESSION: 1. No evidence of acute intracranial injury. Negative for cervical spine fracture. 2. Left-sided sinusitis with fluid levels. Left mastoid and middle ear opacification. 3. 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 Electronically Signed   By: Jorje Guild M.D.   On: 10/26/2021 10:26   CT CHEST WO CONTRAST  Result Date: 10/23/2021 CLINICAL DATA:  Esophageal cancer, assess treatment response, chemotherapy in progress,  radiation therapy complete. Shortness of breath on exertion and cough. EXAM: CT CHEST, ABDOMEN AND PELVIS WITHOUT CONTRAST TECHNIQUE: Multidetector CT imaging of the chest, abdomen and pelvis was  performed following the standard protocol without IV contrast. RADIATION DOSE REDUCTION: This exam was performed according to the departmental dose-optimization program which includes automated exposure control, adjustment of the mA and/or kV according to patient size and/or use of iterative reconstruction technique. COMPARISON:  07/13/2021. FINDINGS: CT CHEST FINDINGS Cardiovascular: Right IJ Port-A-Cath terminates in the low SVC. Atherosclerotic calcification of the aorta, aortic valve and coronary arteries. Ascending aorta measures up to 4.3 cm. Heart is at the upper limits of normal in size to mildly enlarged. Dense mitral annulus calcification. No pericardial effusion. Mediastinum/Nodes: No pathologically enlarged mediastinal, periesophageal or axillary lymph nodes. Hilar regions are difficult to definitively evaluate without IV contrast but appear grossly unremarkable. Slight distal esophageal wall thickening. Lungs/Pleura: Subpleural reticular densities and ground-glass may be minimally progressive. Associated traction bronchiolectasis. 4 mm peripheral left lower lobe nodule (6/117), new. No pleural fluid. Minimal debris in the airway. Musculoskeletal: Degenerative changes in the spine. Bilateral shoulder arthroplasties. T11 and T12 compression fractures are new from 07/13/2021. No worrisome lytic or sclerotic lesions. CT ABDOMEN PELVIS FINDINGS Hepatobiliary: Liver and gallbladder are unremarkable. No biliary ductal dilatation. Pancreas: Negative. Spleen: Negative. Adrenals/Urinary Tract: Right adrenal gland is unremarkable. There may be slight nodular thickening of the left adrenal gland. Kidneys are unremarkable. Ureters are decompressed. Ventral bladder wall thickening. Stomach/Bowel: Stomach, small bowel and  appendix are unremarkable. A fair amount of stool in colon is indicative of constipation. Vascular/Lymphatic: Atherosclerotic calcification of the aorta. No pathologically enlarged lymph nodes. Reproductive: Prostate is mildly prominent. Other: No free fluid. Mesenteries and peritoneum are otherwise unremarkable. Musculoskeletal: Degenerative and postoperative changes in the spine. Dextroconvex scoliosis of the lumbar spine. IMPRESSION: 1. Residual distal esophageal wall thickening. No definitive evidence metastatic disease. 2. 4 mm peripheral left lower lobe nodule may be new. Recommend attention on follow-up. 3. Subpleural reticular densities and ground-glass may be minimally progressive. Associated traction bronchiolectasis. Findings may reflect nonspecific interstitial pneumonitis or usual interstitial pneumonitis and could be treatment related. 4. Ascending aortic aneurysm. Recommend annual imaging followup by CTA or MRA. This recommendation follows 2010 ACCF/AHA/AATS/ACR/ASA/SCA/SCAI/SIR/STS/SVM Guidelines for the Diagnosis and Management of Patients with Thoracic Aortic Disease. Circulation. 2010; 121: V956-L875. Aortic aneurysm NOS (ICD10-I71.9). 5. Mild prostate prominence with ventral bladder wall thickening, indicative of an element outlet obstruction. 6. New T11 and T12 compression fractures. 7. Aortic atherosclerosis (ICD10-I70.0). Coronary artery calcification. Electronically Signed   By: Lorin Picket M.D.   On: 10/23/2021 13:10   CT CERVICAL SPINE WO CONTRAST  Result Date: 10/26/2021 CLINICAL DATA:  Moderate to severe head trauma EXAM: CT HEAD WITHOUT CONTRAST CT CERVICAL SPINE WITHOUT CONTRAST TECHNIQUE: Multidetector CT imaging of the head and cervical spine was performed following the standard protocol without intravenous contrast. Multiplanar CT image reconstructions of the cervical spine were also generated. RADIATION DOSE REDUCTION: This exam was performed according to the departmental  dose-optimization program which includes automated exposure control, adjustment of the mA and/or kV according to patient size and/or use of iterative reconstruction technique. COMPARISON:  None. FINDINGS: CT HEAD FINDINGS Brain: No evidence of acute infarction, hemorrhage, hydrocephalus, extra-axial collection or mass lesion/mass effect. 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 Cerebral volume loss, central predominant. Small, incidental calcification in the inter hemispheric fissure. Vascular: No hyperdense vessel or unexpected calcification. Skull: No acute fracture. Sinuses/Orbits: Bilateral cataract resection. Fluid levels that are low-density in the left maxillary and sphenoid sinuses. Left mastoid and middle ear opacification that is subtotal. CT CERVICAL SPINE FINDINGS Alignment: Reversal of cervical  lordosis. Skull base and vertebrae: Streak artifact intermittent motion affects visualization, streak artifact heavily affecting the C1 and C2 levels. No convincing fracture. Generalized osteopenia. Soft tissues and spinal canal: No prevertebral fluid or swelling. No visible canal hematoma. Disc levels: Generalized degenerative disc narrowing and ridging. Multilevel facet spurring. Upper chest: Negative IMPRESSION: 1. No evidence of acute intracranial injury. Negative for cervical spine fracture. 2. Left-sided sinusitis with fluid levels. Left mastoid and middle ear opacification. 3. 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 Electronically Signed   By: Jorje Guild M.D.   On: 10/26/2021 10:26   DG Pelvis Portable  Result Date: 10/26/2021 CLINICAL DATA:  Found down.  Weakness EXAM: PORTABLE PELVIS 1-2 VIEWS COMPARISON:  10/20/2021 FINDINGS: Bones appear demineralized. There is no evidence of pelvic fracture or diastasis. No pelvic bone lesions are seen. Large volume of stool projects throughout the visualized colon. Atherosclerotic vascular calcifications are  present. IMPRESSION: Negative. Electronically Signed   By: Davina Poke D.O.   On: 10/26/2021 09:33   DG CHEST PORT 1 VIEW  Result Date: 10/30/2021 CLINICAL DATA:  Dyspnea EXAM: PORTABLE CHEST 1 VIEW COMPARISON:  Chest radiograph from one day prior. FINDINGS: Right internal jugular Port-A-Cath terminates in the middle third of the SVC. Right shoulder hemiarthroplasty and partially visualized left shoulder total arthroplasty. Stable cardiomediastinal silhouette with mild cardiomegaly. No pneumothorax. No pleural effusion. Hazy streaky opacities in peripheral mid to lower left lung, similar. Largely resolved hazy right lung base opacities. IMPRESSION: 1. Largely resolved hazy right lung base opacities. 2. Stable hazy streaky opacities in the peripheral mid to lower left lung, favoring pneumonia. 3. Stable cardiomegaly. Electronically Signed   By: Ilona Sorrel M.D.   On: 10/30/2021 08:05   DG CHEST PORT 1 VIEW  Result Date: 10/29/2021 CLINICAL DATA:  76 year old male with history of shortness of breath. EXAM: PORTABLE CHEST 1 VIEW COMPARISON:  Chest x-ray 10/28/2021. FINDINGS: Right internal jugular single-lumen power porta cath with tip terminating in the mid superior vena cava. Lung volumes are slightly low. Diffuse peribronchial cuffing and widespread interstitial prominence, with patchy ill-defined peripheral predominant airspace disease, similar to the prior study. No pneumothorax. No suspicious appearing pulmonary nodules or masses are noted. Heart size is normal. Upper mediastinal contours are within normal limits. Status post bilateral shoulder arthroplasties. IMPRESSION: 1. The appearance the chest again suggests multilobar bilateral bronchopneumonia, with similar aeration compared to the prior examination. 2. Postoperative changes and support apparatus, as above. Electronically Signed   By: Vinnie Langton M.D.   On: 10/29/2021 09:23   DG CHEST PORT 1 VIEW  Result Date: 10/28/2021 CLINICAL  DATA:  Shortness of breath Evaluate for pneumonia EXAM: PORTABLE CHEST 1 VIEW COMPARISON:  10/26/2021 FINDINGS: Unchanged mild cardiomegaly. Right chest port is unchanged in position. Bilateral shoulder prostheses again seen. Mild pulmonary vascular congestion is unchanged. Peripheral lung opacities are not significantly changed compared to prior CT exam. IMPRESSION: 1. Unchanged mild cardiomegaly. 2. Unchanged bilateral subpleural parenchymal opacities. Electronically Signed   By: Miachel Roux M.D.   On: 10/28/2021 13:42   DG Chest Port 1 View  Result Date: 10/26/2021 CLINICAL DATA:  Family found the patient on the floor, last seen yesterday after chemotherapy when patient complained of weakness. Patient does not remember falling. EXAM: PORTABLE CHEST 1 VIEW COMPARISON:  CT examination dated October 20, 2021 FINDINGS: The heart is enlarged. There are bilateral lower lobe opacities, left worse than the right concerning for pneumonia including aspiration pneumonia. Bilateral shoulder arthroplasty.  Right IJ access MediPort with distal tip in the SVC. IMPRESSION: 1. Stable cardiomegaly. 2. Bilateral lower lobe opacities, left worse than the right concerning for pneumonia including aspiration pneumonia. Follow-up examination to resolution is recommended. Electronically Signed   By: Keane Police D.O.   On: 10/26/2021 09:35   CT CHEST ABDOMEN PELVIS WO CONTRAST  Result Date: 10/26/2021 CLINICAL DATA:  76 year old male presents for evaluation following fall. Patient also with history of esophageal neoplasm. EXAM: CT CHEST, ABDOMEN AND PELVIS WITHOUT CONTRAST TECHNIQUE: Multidetector CT imaging of the chest, abdomen and pelvis was performed following the standard protocol without IV contrast. RADIATION DOSE REDUCTION: This exam was performed according to the departmental dose-optimization program which includes automated exposure control, adjustment of the mA and/or kV according to patient size and/or use of iterative  reconstruction technique. COMPARISON:  October 20, 2021. FINDINGS: CT CHEST FINDINGS Cardiovascular: The aorta shows smooth contours without adjacent stranding. No mediastinal hematoma. Aortic dilation to approximately 4 cm greatest axial dimension is unchanged. Generalized aortic atherosclerosis. Signs of mitral annular calcification. Three-vessel coronary artery disease. No substantial pericardial effusion, stable small pericardial effusion when compared to previous imaging from just 6 days ago RIGHT-sided Port-A-Cath terminates at the caval to atrial junction. Mediastinum/Nodes: Patulous esophagus similar to the prior exam. Mild thickening of the distal esophagus also unchanged in this patient with known history of esophageal neoplasm. No mediastinal hematoma or adenopathy. No axillary or thoracic inlet adenopathy. Lungs/Pleura: No pneumothorax. Reticular opacities and nodular changes along the pleural surface with increasing conspicuity since previous imaging. Pleural based airspace disease with nodular features at the lung bases and along the anterior chest, these findings have developed since the previous exam of just 6 days ago. No pneumothorax. Material and the trachea and material layering in the mainstem bronchi extending into peripheral bronchi with signs of bronchial wall thickening. Musculoskeletal: See below for full musculoskeletal details. CT ABDOMEN PELVIS FINDINGS Hepatobiliary: Liver with smooth contours. Trace fluid adjacent to medial RIGHT hemi liver similar to previous imaging. No gross signs of hepatic trauma. No Peri hepatic fluid. No pericholecystic stranding. Pancreas: Pancreatic atrophy without signs of adjacent inflammation. Spleen: Trace stranding adjacent to the spleen. Minimal fluid adjacent to the spleen is similar to the study of October 20, 2021. No gross splenic abnormality. Adrenals/Urinary Tract: Mild bilateral adrenal thickening. Perinephric stranding bilaterally perhaps  slightly increased compared to previous imaging. Generalized increase in fascial thickening and edema albeit mild since the previous study. No hydronephrosis.  No perinephric hematoma. Stomach/Bowel: Small hiatal hernia and distal esophageal thickening as described. No signs of bowel dilation to suggest obstruction of the small bowel. The appendix is normal. Colon is stool filled without signs of adjacent stranding. No signs of pneumatosis. No pneumoperitoneum. No pelvic fluid. Vascular/Lymphatic: Aortic atherosclerosis. No sign of aneurysm. Smooth contour of the IVC. There is no gastrohepatic or hepatoduodenal ligament lymphadenopathy. No retroperitoneal or mesenteric lymphadenopathy. No pelvic sidewall lymphadenopathy. Atherosclerosis is moderate to marked. Vascular structures not well assessed given the lack of intravenous contrast. No focal stranding or hematoma adjacent to the abdominal aorta or pelvic vessels. Reproductive: Unremarkable by CT. Urinary bladder is moderately distended extending to the sacral promontory. Other: No fluid in the pelvis. Trace fluid about the liver and spleen similar to recent comparison imaging. Musculoskeletal: No displaced rib fractures. Mildly limited assessment due to respiratory motion. Bony pelvis without signs of fracture. Extensive spinal degenerative changes and osteopenia. Post bilateral shoulder arthroplasty. Visualized clavicles and scapulae are intact. Signs of prior  L3-L4 spinal fusion and cement augmentation at L4 showing no change. Spinal compression fractures with similar appearance at the T11-T12 level based on recent comparison imaging with approximally 30% loss of height at these levels. Spinal alignment is unchanged. Sternum grossly intact with some artifact crossing the anterior sternum due to motion. IMPRESSION: 1. No evidence of acute traumatic injury to the chest, abdomen or pelvis. 2. Peripheral nodular parenchymal consolidative changes with marked  worsening since the recent comparison imaging potentially post/drug treatment related given pattern seen and rapid interval progression. Correlate with any respiratory symptoms. Findings are felt to have an overlay of acute inflammatory changes related to aspiration given findings in the bronchi. However, subpleural changes extend along the anterior chest which would be atypical for purely aspiration related changes. 3. Small hiatal hernia and distal esophageal thickening also unchanged in this patient with known history of esophageal neoplasm. 4. Mild generalized edema with perinephric stranding and fascial edema in the abdomen not substantially changed compared to recent imaging. 5. Unchanged appearance of T11-T12 compression fractures. 6. Aortic atherosclerosis. Three-vessel coronary artery disease. 7. Ascending thoracic aortic aneurysm. Recommend annual imaging followup by CTA or MRA. This recommendation follows 2010 ACCF/AHA/AATS/ACR/ASA/SCA/SCAI/SIR/STS/SVM Guidelines for the Diagnosis and Management of Patients with Thoracic Aortic Disease. Circulation. 2010; 121: D638-V564. Aortic aneurysm NOS (ICD10-I71.9) Aortic Atherosclerosis (ICD10-I70.0). Electronically Signed   By: Zetta Bills M.D.   On: 10/26/2021 10:31    ASSESSMENT & PLAN MALEKE FERIA 76 y.o. male with medical history significant for metastatic esophageal adenocarcinoma who presents for a follow up visit. The patient is negative for HER2 but has a high TPS score.  Therefore he is an excellent candidate for FOLFOX with nivolumab.    The regimen of FOLFOX with nivolumab will be administered q 2 weeks with clinic visits prior to each infusion. This will be continued until progression or intolerance.   #Metastatic Adenocarcinoma of the Esophagus #Dysphagia -- Underwent palliative radiation in order to help alleviate his dysphagia. Patient now tolerating PO well.  --Unfortunately given the spread of this cancer to the skin we will need to  treat this as metastatic disease with systemic therapy  -- Final testing on the tissue resulted with HER2 negative status, but TPS score of 60%.  Therefore he would be a good candidate for FOLFOX with nivolumab --Started Cycle 1 Day 1 of FOLFOX plus Nivolumab on 04/26/2021 with good tolerance.  --Restaging CT Scan form 10/20/2021 shows stable soft tissue thickening of the esophagus. No evidence of progression.  Plan:  --Labs today reviewed and adequate for treatment. WBC 1.9, Hgb 9.5, Plt 105 K. CMP reviewed and requires no intervention. --HOLD Cycle 12 Day 1 today.  Held due to neutropenia, anemia, and recent traumatic fall. --next CT scan in May 2023 --RTC in 2 weeks for Cycle 12   #Supportive Care -- chemotherapy education complete -- port placed -- zofran 63m q8H PRN and compazine 143mPO q6H for nausea -- EMLA cream for port  No orders of the defined types were placed in this encounter.  All questions were answered. The patient knows to call the clinic with any problems, questions or concerns.  I have spent a total of 30 minutes minutes of face-to-face and non-face-to-face time, preparing to see the patient, performing a medically appropriate examination, counseling and educating the patient, documenting clinical information in the electronic health record, and care coordination.   JoLedell PeoplesMD Department of Hematology/Oncology [facility]: [phone]  Pager: [phone] Email: [name]  11/07/2021 12:50 PM

## 2021-11-08 ENCOUNTER — Telehealth: Payer: Self-pay | Admitting: *Deleted

## 2021-11-08 ENCOUNTER — Ambulatory Visit: Payer: PPO | Admitting: Physician Assistant

## 2021-11-08 ENCOUNTER — Other Ambulatory Visit: Payer: Self-pay | Admitting: *Deleted

## 2021-11-08 DIAGNOSIS — C159 Malignant neoplasm of esophagus, unspecified: Secondary | ICD-10-CM

## 2021-11-08 NOTE — Telephone Encounter (Signed)
[facility] for referral. Pt has skin tears on both upper arms from recent fall. They cannot see him until 12/15/2021 but will put him on their cancellation list. Will check pt's arms when he comes on 11/10/21 for his pump d/c

## 2021-11-09 ENCOUNTER — Inpatient Hospital Stay: Payer: PPO

## 2021-11-09 ENCOUNTER — Telehealth: Payer: Self-pay | Admitting: Internal Medicine

## 2021-11-09 MED ORDER — APIXABAN 5 MG PO TABS: 5.0000 mg | ORAL_TABLET | Freq: Two times a day (BID) | ORAL | 2 refills | Status: AC

## 2021-11-09 NOTE — Telephone Encounter (Signed)
Pt c/o medication issue:  1. Name of Medication: apixaban (ELIQUIS) 5 MG TABS tablet  2. How are you currently taking this medication (dosage and times per day)? Take 1 tablet (5 mg total) by mouth 2 (two) times daily  3. Are you having a reaction (difficulty breathing--STAT)? no  4. What is your medication issue? Patient was for a refill on this medication but then ask why is he taking this medication. The dr that prescribe this medication is an ER dr. Patient wants to know why he needs to take this medication due to the fact that he has two wounds that havent healed yet. Patients states that he bruise easily. Please advise

## 2021-11-09 NOTE — Telephone Encounter (Signed)
Returned call to Pt.  Discussed indication for taking Eliquis.  He states he bruises easy and has some open wounds (from falls?).    Advised Pt to continue the Eliquis until his f/u appt with RU in March and to be sure to discuss with her then.  He then states he would like some refills because he is sure she will want him to continue.   Will send to [facility] to approve refill.

## 2021-11-09 NOTE — Telephone Encounter (Signed)
Prescription refill request for Eliquis received. Indication: Afib  Last office visit: 08/01/21 Lovena Le)  Scr: 0.58 (11/07/21)  Age: 76 Weight: 70.9kg  Appropriate dose and refill sent to requested pharmacy.

## 2021-11-12 DIAGNOSIS — C159 Malignant neoplasm of esophagus, unspecified: Secondary | ICD-10-CM | POA: Diagnosis not present

## 2021-11-12 DIAGNOSIS — M1990 Unspecified osteoarthritis, unspecified site: Secondary | ICD-10-CM | POA: Diagnosis not present

## 2021-11-12 DIAGNOSIS — F324 Major depressive disorder, single episode, in partial remission: Secondary | ICD-10-CM | POA: Diagnosis not present

## 2021-11-12 DIAGNOSIS — Z6837 Body mass index (BMI) 37.0-37.9, adult: Secondary | ICD-10-CM | POA: Diagnosis not present

## 2021-11-12 DIAGNOSIS — K219 Gastro-esophageal reflux disease without esophagitis: Secondary | ICD-10-CM | POA: Diagnosis not present

## 2021-11-12 DIAGNOSIS — G2581 Restless legs syndrome: Secondary | ICD-10-CM | POA: Diagnosis not present

## 2021-11-12 DIAGNOSIS — I129 Hypertensive chronic kidney disease with stage 1 through stage 4 chronic kidney disease, or unspecified chronic kidney disease: Secondary | ICD-10-CM | POA: Diagnosis not present

## 2021-11-12 DIAGNOSIS — G894 Chronic pain syndrome: Secondary | ICD-10-CM | POA: Diagnosis not present

## 2021-11-12 DIAGNOSIS — G4730 Sleep apnea, unspecified: Secondary | ICD-10-CM | POA: Diagnosis not present

## 2021-11-12 DIAGNOSIS — E7800 Pure hypercholesterolemia, unspecified: Secondary | ICD-10-CM | POA: Diagnosis not present

## 2021-11-12 DIAGNOSIS — M5440 Lumbago with sciatica, unspecified side: Secondary | ICD-10-CM | POA: Diagnosis not present

## 2021-11-12 DIAGNOSIS — F17210 Nicotine dependence, cigarettes, uncomplicated: Secondary | ICD-10-CM | POA: Diagnosis not present

## 2021-11-12 DIAGNOSIS — G4700 Insomnia, unspecified: Secondary | ICD-10-CM | POA: Diagnosis not present

## 2021-11-12 DIAGNOSIS — I2510 Atherosclerotic heart disease of native coronary artery without angina pectoris: Secondary | ICD-10-CM | POA: Diagnosis not present

## 2021-11-12 DIAGNOSIS — F439 Reaction to severe stress, unspecified: Secondary | ICD-10-CM | POA: Diagnosis not present

## 2021-11-12 DIAGNOSIS — E440 Moderate protein-calorie malnutrition: Secondary | ICD-10-CM | POA: Diagnosis not present

## 2021-11-12 DIAGNOSIS — J441 Chronic obstructive pulmonary disease with (acute) exacerbation: Secondary | ICD-10-CM | POA: Diagnosis not present

## 2021-11-12 DIAGNOSIS — Z96651 Presence of right artificial knee joint: Secondary | ICD-10-CM | POA: Diagnosis not present

## 2021-11-12 DIAGNOSIS — R1310 Dysphagia, unspecified: Secondary | ICD-10-CM | POA: Diagnosis not present

## 2021-11-12 DIAGNOSIS — G250 Essential tremor: Secondary | ICD-10-CM | POA: Diagnosis not present

## 2021-11-12 DIAGNOSIS — E1122 Type 2 diabetes mellitus with diabetic chronic kidney disease: Secondary | ICD-10-CM | POA: Diagnosis not present

## 2021-11-12 DIAGNOSIS — E291 Testicular hypofunction: Secondary | ICD-10-CM | POA: Diagnosis not present

## 2021-11-12 DIAGNOSIS — N181 Chronic kidney disease, stage 1: Secondary | ICD-10-CM | POA: Diagnosis not present

## 2021-11-14 DIAGNOSIS — R1310 Dysphagia, unspecified: Secondary | ICD-10-CM | POA: Diagnosis not present

## 2021-11-14 DIAGNOSIS — M5440 Lumbago with sciatica, unspecified side: Secondary | ICD-10-CM | POA: Diagnosis not present

## 2021-11-14 DIAGNOSIS — G250 Essential tremor: Secondary | ICD-10-CM | POA: Diagnosis not present

## 2021-11-14 DIAGNOSIS — M1990 Unspecified osteoarthritis, unspecified site: Secondary | ICD-10-CM | POA: Diagnosis not present

## 2021-11-14 DIAGNOSIS — F439 Reaction to severe stress, unspecified: Secondary | ICD-10-CM | POA: Diagnosis not present

## 2021-11-14 DIAGNOSIS — E7800 Pure hypercholesterolemia, unspecified: Secondary | ICD-10-CM | POA: Diagnosis not present

## 2021-11-14 DIAGNOSIS — F17210 Nicotine dependence, cigarettes, uncomplicated: Secondary | ICD-10-CM | POA: Diagnosis not present

## 2021-11-14 DIAGNOSIS — Z96651 Presence of right artificial knee joint: Secondary | ICD-10-CM | POA: Diagnosis not present

## 2021-11-14 DIAGNOSIS — F324 Major depressive disorder, single episode, in partial remission: Secondary | ICD-10-CM | POA: Diagnosis not present

## 2021-11-14 DIAGNOSIS — N181 Chronic kidney disease, stage 1: Secondary | ICD-10-CM | POA: Diagnosis not present

## 2021-11-14 DIAGNOSIS — I2510 Atherosclerotic heart disease of native coronary artery without angina pectoris: Secondary | ICD-10-CM | POA: Diagnosis not present

## 2021-11-14 DIAGNOSIS — E291 Testicular hypofunction: Secondary | ICD-10-CM | POA: Diagnosis not present

## 2021-11-14 DIAGNOSIS — E440 Moderate protein-calorie malnutrition: Secondary | ICD-10-CM | POA: Diagnosis not present

## 2021-11-14 DIAGNOSIS — G4730 Sleep apnea, unspecified: Secondary | ICD-10-CM | POA: Diagnosis not present

## 2021-11-14 DIAGNOSIS — K219 Gastro-esophageal reflux disease without esophagitis: Secondary | ICD-10-CM | POA: Diagnosis not present

## 2021-11-14 DIAGNOSIS — E1122 Type 2 diabetes mellitus with diabetic chronic kidney disease: Secondary | ICD-10-CM | POA: Diagnosis not present

## 2021-11-14 DIAGNOSIS — G4700 Insomnia, unspecified: Secondary | ICD-10-CM | POA: Diagnosis not present

## 2021-11-14 DIAGNOSIS — C159 Malignant neoplasm of esophagus, unspecified: Secondary | ICD-10-CM | POA: Diagnosis not present

## 2021-11-14 DIAGNOSIS — G894 Chronic pain syndrome: Secondary | ICD-10-CM | POA: Diagnosis not present

## 2021-11-14 DIAGNOSIS — I129 Hypertensive chronic kidney disease with stage 1 through stage 4 chronic kidney disease, or unspecified chronic kidney disease: Secondary | ICD-10-CM | POA: Diagnosis not present

## 2021-11-14 DIAGNOSIS — Z6837 Body mass index (BMI) 37.0-37.9, adult: Secondary | ICD-10-CM | POA: Diagnosis not present

## 2021-11-14 DIAGNOSIS — G2581 Restless legs syndrome: Secondary | ICD-10-CM | POA: Diagnosis not present

## 2021-11-14 DIAGNOSIS — J441 Chronic obstructive pulmonary disease with (acute) exacerbation: Secondary | ICD-10-CM | POA: Diagnosis not present

## 2021-11-15 DIAGNOSIS — Z09 Encounter for follow-up examination after completed treatment for conditions other than malignant neoplasm: Secondary | ICD-10-CM | POA: Diagnosis not present

## 2021-11-15 DIAGNOSIS — M1990 Unspecified osteoarthritis, unspecified site: Secondary | ICD-10-CM | POA: Diagnosis not present

## 2021-11-15 DIAGNOSIS — M5440 Lumbago with sciatica, unspecified side: Secondary | ICD-10-CM | POA: Diagnosis not present

## 2021-11-15 DIAGNOSIS — G250 Essential tremor: Secondary | ICD-10-CM | POA: Diagnosis not present

## 2021-11-15 DIAGNOSIS — I129 Hypertensive chronic kidney disease with stage 1 through stage 4 chronic kidney disease, or unspecified chronic kidney disease: Secondary | ICD-10-CM | POA: Diagnosis not present

## 2021-11-15 DIAGNOSIS — Z7985 Long-term (current) use of injectable non-insulin antidiabetic drugs: Secondary | ICD-10-CM | POA: Diagnosis not present

## 2021-11-15 DIAGNOSIS — I10 Essential (primary) hypertension: Secondary | ICD-10-CM | POA: Diagnosis not present

## 2021-11-15 DIAGNOSIS — K219 Gastro-esophageal reflux disease without esophagitis: Secondary | ICD-10-CM | POA: Diagnosis not present

## 2021-11-15 DIAGNOSIS — Z96651 Presence of right artificial knee joint: Secondary | ICD-10-CM | POA: Diagnosis not present

## 2021-11-15 DIAGNOSIS — G4700 Insomnia, unspecified: Secondary | ICD-10-CM | POA: Diagnosis not present

## 2021-11-15 DIAGNOSIS — E7800 Pure hypercholesterolemia, unspecified: Secondary | ICD-10-CM | POA: Diagnosis not present

## 2021-11-15 DIAGNOSIS — G894 Chronic pain syndrome: Secondary | ICD-10-CM | POA: Diagnosis not present

## 2021-11-15 DIAGNOSIS — R296 Repeated falls: Secondary | ICD-10-CM | POA: Diagnosis not present

## 2021-11-15 DIAGNOSIS — G4730 Sleep apnea, unspecified: Secondary | ICD-10-CM | POA: Diagnosis not present

## 2021-11-15 DIAGNOSIS — N181 Chronic kidney disease, stage 1: Secondary | ICD-10-CM | POA: Diagnosis not present

## 2021-11-15 DIAGNOSIS — J690 Pneumonitis due to inhalation of food and vomit: Secondary | ICD-10-CM | POA: Diagnosis not present

## 2021-11-15 DIAGNOSIS — C159 Malignant neoplasm of esophagus, unspecified: Secondary | ICD-10-CM | POA: Diagnosis not present

## 2021-11-15 DIAGNOSIS — E291 Testicular hypofunction: Secondary | ICD-10-CM | POA: Diagnosis not present

## 2021-11-15 DIAGNOSIS — E440 Moderate protein-calorie malnutrition: Secondary | ICD-10-CM | POA: Diagnosis not present

## 2021-11-15 DIAGNOSIS — Z6837 Body mass index (BMI) 37.0-37.9, adult: Secondary | ICD-10-CM | POA: Diagnosis not present

## 2021-11-15 DIAGNOSIS — F17200 Nicotine dependence, unspecified, uncomplicated: Secondary | ICD-10-CM | POA: Diagnosis not present

## 2021-11-15 DIAGNOSIS — R1310 Dysphagia, unspecified: Secondary | ICD-10-CM | POA: Diagnosis not present

## 2021-11-15 DIAGNOSIS — G8929 Other chronic pain: Secondary | ICD-10-CM | POA: Diagnosis not present

## 2021-11-15 DIAGNOSIS — E1122 Type 2 diabetes mellitus with diabetic chronic kidney disease: Secondary | ICD-10-CM | POA: Diagnosis not present

## 2021-11-15 DIAGNOSIS — F17210 Nicotine dependence, cigarettes, uncomplicated: Secondary | ICD-10-CM | POA: Diagnosis not present

## 2021-11-15 DIAGNOSIS — G2581 Restless legs syndrome: Secondary | ICD-10-CM | POA: Diagnosis not present

## 2021-11-15 DIAGNOSIS — J441 Chronic obstructive pulmonary disease with (acute) exacerbation: Secondary | ICD-10-CM | POA: Diagnosis not present

## 2021-11-15 DIAGNOSIS — F439 Reaction to severe stress, unspecified: Secondary | ICD-10-CM | POA: Diagnosis not present

## 2021-11-15 DIAGNOSIS — I2510 Atherosclerotic heart disease of native coronary artery without angina pectoris: Secondary | ICD-10-CM | POA: Diagnosis not present

## 2021-11-15 DIAGNOSIS — E1121 Type 2 diabetes mellitus with diabetic nephropathy: Secondary | ICD-10-CM | POA: Diagnosis not present

## 2021-11-15 DIAGNOSIS — F324 Major depressive disorder, single episode, in partial remission: Secondary | ICD-10-CM | POA: Diagnosis not present

## 2021-11-21 ENCOUNTER — Other Ambulatory Visit: Payer: Self-pay | Admitting: Hematology and Oncology

## 2021-11-21 DIAGNOSIS — C159 Malignant neoplasm of esophagus, unspecified: Secondary | ICD-10-CM

## 2021-11-21 MED FILL — Dexamethasone Sodium Phosphate Inj 100 MG/10ML: INTRAMUSCULAR | Qty: 1 | Status: AC

## 2021-11-22 ENCOUNTER — Other Ambulatory Visit: Payer: Self-pay

## 2021-11-22 ENCOUNTER — Inpatient Hospital Stay: Payer: PPO | Admitting: Hematology and Oncology

## 2021-11-22 ENCOUNTER — Inpatient Hospital Stay: Payer: PPO | Attending: Hematology and Oncology

## 2021-11-22 ENCOUNTER — Emergency Department (HOSPITAL_COMMUNITY): Payer: PPO

## 2021-11-22 ENCOUNTER — Inpatient Hospital Stay: Payer: PPO

## 2021-11-22 ENCOUNTER — Inpatient Hospital Stay (HOSPITAL_COMMUNITY)
Admission: EM | Admit: 2021-11-22 | Discharge: 2021-12-16 | DRG: 870 | Disposition: E | Payer: PPO | Source: Ambulatory Visit | Attending: Pulmonary Disease | Admitting: Pulmonary Disease

## 2021-11-22 ENCOUNTER — Encounter (HOSPITAL_COMMUNITY): Payer: Self-pay

## 2021-11-22 ENCOUNTER — Inpatient Hospital Stay (HOSPITAL_COMMUNITY): Payer: PPO

## 2021-11-22 VITALS — BP 115/78 | HR 87 | Temp 98.0°F | Resp 16 | Wt 156.1 lb

## 2021-11-22 VITALS — BP 138/92 | HR 134 | Temp 98.7°F | Resp 22

## 2021-11-22 DIAGNOSIS — R579 Shock, unspecified: Secondary | ICD-10-CM | POA: Diagnosis not present

## 2021-11-22 DIAGNOSIS — I480 Paroxysmal atrial fibrillation: Secondary | ICD-10-CM | POA: Diagnosis not present

## 2021-11-22 DIAGNOSIS — Z95828 Presence of other vascular implants and grafts: Secondary | ICD-10-CM | POA: Diagnosis not present

## 2021-11-22 DIAGNOSIS — L89151 Pressure ulcer of sacral region, stage 1: Secondary | ICD-10-CM | POA: Diagnosis present

## 2021-11-22 DIAGNOSIS — M47816 Spondylosis without myelopathy or radiculopathy, lumbar region: Secondary | ICD-10-CM | POA: Diagnosis not present

## 2021-11-22 DIAGNOSIS — G9341 Metabolic encephalopathy: Secondary | ICD-10-CM | POA: Diagnosis not present

## 2021-11-22 DIAGNOSIS — M4186 Other forms of scoliosis, lumbar region: Secondary | ICD-10-CM | POA: Diagnosis not present

## 2021-11-22 DIAGNOSIS — Z91041 Radiographic dye allergy status: Secondary | ICD-10-CM

## 2021-11-22 DIAGNOSIS — C7989 Secondary malignant neoplasm of other specified sites: Secondary | ICD-10-CM | POA: Diagnosis not present

## 2021-11-22 DIAGNOSIS — Z8249 Family history of ischemic heart disease and other diseases of the circulatory system: Secondary | ICD-10-CM

## 2021-11-22 DIAGNOSIS — E7800 Pure hypercholesterolemia, unspecified: Secondary | ICD-10-CM | POA: Diagnosis not present

## 2021-11-22 DIAGNOSIS — J159 Unspecified bacterial pneumonia: Secondary | ICD-10-CM | POA: Diagnosis present

## 2021-11-22 DIAGNOSIS — C155 Malignant neoplasm of lower third of esophagus: Secondary | ICD-10-CM | POA: Diagnosis present

## 2021-11-22 DIAGNOSIS — J438 Other emphysema: Secondary | ICD-10-CM | POA: Diagnosis not present

## 2021-11-22 DIAGNOSIS — J80 Acute respiratory distress syndrome: Secondary | ICD-10-CM

## 2021-11-22 DIAGNOSIS — J189 Pneumonia, unspecified organism: Secondary | ICD-10-CM | POA: Diagnosis not present

## 2021-11-22 DIAGNOSIS — C792 Secondary malignant neoplasm of skin: Secondary | ICD-10-CM | POA: Diagnosis not present

## 2021-11-22 DIAGNOSIS — J209 Acute bronchitis, unspecified: Secondary | ICD-10-CM | POA: Diagnosis not present

## 2021-11-22 DIAGNOSIS — K5730 Diverticulosis of large intestine without perforation or abscess without bleeding: Secondary | ICD-10-CM | POA: Diagnosis not present

## 2021-11-22 DIAGNOSIS — I509 Heart failure, unspecified: Secondary | ICD-10-CM | POA: Diagnosis present

## 2021-11-22 DIAGNOSIS — R1310 Dysphagia, unspecified: Secondary | ICD-10-CM | POA: Diagnosis present

## 2021-11-22 DIAGNOSIS — E669 Obesity, unspecified: Secondary | ICD-10-CM | POA: Diagnosis not present

## 2021-11-22 DIAGNOSIS — Z981 Arthrodesis status: Secondary | ICD-10-CM | POA: Diagnosis not present

## 2021-11-22 DIAGNOSIS — F32A Depression, unspecified: Secondary | ICD-10-CM | POA: Diagnosis not present

## 2021-11-22 DIAGNOSIS — Z66 Do not resuscitate: Secondary | ICD-10-CM | POA: Diagnosis not present

## 2021-11-22 DIAGNOSIS — D6481 Anemia due to antineoplastic chemotherapy: Secondary | ICD-10-CM | POA: Diagnosis present

## 2021-11-22 DIAGNOSIS — Z20822 Contact with and (suspected) exposure to covid-19: Secondary | ICD-10-CM | POA: Diagnosis not present

## 2021-11-22 DIAGNOSIS — Z923 Personal history of irradiation: Secondary | ICD-10-CM

## 2021-11-22 DIAGNOSIS — G4733 Obstructive sleep apnea (adult) (pediatric): Secondary | ICD-10-CM | POA: Diagnosis present

## 2021-11-22 DIAGNOSIS — S41111A Laceration without foreign body of right upper arm, initial encounter: Secondary | ICD-10-CM | POA: Diagnosis not present

## 2021-11-22 DIAGNOSIS — T782XXA Anaphylactic shock, unspecified, initial encounter: Secondary | ICD-10-CM | POA: Diagnosis not present

## 2021-11-22 DIAGNOSIS — D6959 Other secondary thrombocytopenia: Secondary | ICD-10-CM | POA: Diagnosis not present

## 2021-11-22 DIAGNOSIS — R079 Chest pain, unspecified: Secondary | ICD-10-CM | POA: Diagnosis not present

## 2021-11-22 DIAGNOSIS — E872 Acidosis, unspecified: Secondary | ICD-10-CM | POA: Diagnosis not present

## 2021-11-22 DIAGNOSIS — Z452 Encounter for adjustment and management of vascular access device: Secondary | ICD-10-CM | POA: Diagnosis not present

## 2021-11-22 DIAGNOSIS — G8929 Other chronic pain: Secondary | ICD-10-CM | POA: Diagnosis present

## 2021-11-22 DIAGNOSIS — J90 Pleural effusion, not elsewhere classified: Secondary | ICD-10-CM | POA: Diagnosis not present

## 2021-11-22 DIAGNOSIS — Z978 Presence of other specified devices: Secondary | ICD-10-CM

## 2021-11-22 DIAGNOSIS — E871 Hypo-osmolality and hyponatremia: Secondary | ICD-10-CM | POA: Diagnosis not present

## 2021-11-22 DIAGNOSIS — C159 Malignant neoplasm of esophagus, unspecified: Secondary | ICD-10-CM | POA: Diagnosis not present

## 2021-11-22 DIAGNOSIS — Z91013 Allergy to seafood: Secondary | ICD-10-CM

## 2021-11-22 DIAGNOSIS — R0902 Hypoxemia: Secondary | ICD-10-CM | POA: Diagnosis not present

## 2021-11-22 DIAGNOSIS — K5900 Constipation, unspecified: Secondary | ICD-10-CM | POA: Diagnosis not present

## 2021-11-22 DIAGNOSIS — I3139 Other pericardial effusion (noninflammatory): Secondary | ICD-10-CM | POA: Diagnosis not present

## 2021-11-22 DIAGNOSIS — J9601 Acute respiratory failure with hypoxia: Secondary | ICD-10-CM

## 2021-11-22 DIAGNOSIS — D638 Anemia in other chronic diseases classified elsewhere: Secondary | ICD-10-CM | POA: Diagnosis not present

## 2021-11-22 DIAGNOSIS — R000 Tachycardia, unspecified: Secondary | ICD-10-CM | POA: Diagnosis not present

## 2021-11-22 DIAGNOSIS — N3289 Other specified disorders of bladder: Secondary | ICD-10-CM | POA: Diagnosis not present

## 2021-11-22 DIAGNOSIS — J9690 Respiratory failure, unspecified, unspecified whether with hypoxia or hypercapnia: Secondary | ICD-10-CM

## 2021-11-22 DIAGNOSIS — Z6829 Body mass index (BMI) 29.0-29.9, adult: Secondary | ICD-10-CM

## 2021-11-22 DIAGNOSIS — E118 Type 2 diabetes mellitus with unspecified complications: Secondary | ICD-10-CM | POA: Diagnosis present

## 2021-11-22 DIAGNOSIS — Z515 Encounter for palliative care: Secondary | ICD-10-CM

## 2021-11-22 DIAGNOSIS — I110 Hypertensive heart disease with heart failure: Secondary | ICD-10-CM | POA: Diagnosis present

## 2021-11-22 DIAGNOSIS — I2510 Atherosclerotic heart disease of native coronary artery without angina pectoris: Secondary | ICD-10-CM | POA: Diagnosis present

## 2021-11-22 DIAGNOSIS — A419 Sepsis, unspecified organism: Secondary | ICD-10-CM | POA: Diagnosis not present

## 2021-11-22 DIAGNOSIS — E440 Moderate protein-calorie malnutrition: Secondary | ICD-10-CM | POA: Diagnosis not present

## 2021-11-22 DIAGNOSIS — Z880 Allergy status to penicillin: Secondary | ICD-10-CM

## 2021-11-22 DIAGNOSIS — R6521 Severe sepsis with septic shock: Secondary | ICD-10-CM | POA: Diagnosis present

## 2021-11-22 DIAGNOSIS — N179 Acute kidney failure, unspecified: Secondary | ICD-10-CM | POA: Diagnosis not present

## 2021-11-22 DIAGNOSIS — G250 Essential tremor: Secondary | ICD-10-CM | POA: Diagnosis not present

## 2021-11-22 DIAGNOSIS — Z96612 Presence of left artificial shoulder joint: Secondary | ICD-10-CM | POA: Diagnosis present

## 2021-11-22 DIAGNOSIS — Z7901 Long term (current) use of anticoagulants: Secondary | ICD-10-CM

## 2021-11-22 DIAGNOSIS — T451X5A Adverse effect of antineoplastic and immunosuppressive drugs, initial encounter: Secondary | ICD-10-CM | POA: Diagnosis present

## 2021-11-22 DIAGNOSIS — Z881 Allergy status to other antibiotic agents status: Secondary | ICD-10-CM

## 2021-11-22 DIAGNOSIS — F17210 Nicotine dependence, cigarettes, uncomplicated: Secondary | ICD-10-CM | POA: Diagnosis present

## 2021-11-22 DIAGNOSIS — Z4659 Encounter for fitting and adjustment of other gastrointestinal appliance and device: Secondary | ICD-10-CM

## 2021-11-22 DIAGNOSIS — Z91040 Latex allergy status: Secondary | ICD-10-CM

## 2021-11-22 DIAGNOSIS — Z4682 Encounter for fitting and adjustment of non-vascular catheter: Secondary | ICD-10-CM | POA: Diagnosis not present

## 2021-11-22 DIAGNOSIS — R109 Unspecified abdominal pain: Secondary | ICD-10-CM | POA: Diagnosis not present

## 2021-11-22 DIAGNOSIS — I517 Cardiomegaly: Secondary | ICD-10-CM | POA: Diagnosis not present

## 2021-11-22 DIAGNOSIS — D6489 Other specified anemias: Secondary | ICD-10-CM | POA: Diagnosis present

## 2021-11-22 DIAGNOSIS — Z8051 Family history of malignant neoplasm of kidney: Secondary | ICD-10-CM

## 2021-11-22 DIAGNOSIS — Z96651 Presence of right artificial knee joint: Secondary | ICD-10-CM | POA: Diagnosis present

## 2021-11-22 DIAGNOSIS — E1165 Type 2 diabetes mellitus with hyperglycemia: Secondary | ICD-10-CM | POA: Diagnosis present

## 2021-11-22 DIAGNOSIS — Z79899 Other long term (current) drug therapy: Secondary | ICD-10-CM

## 2021-11-22 DIAGNOSIS — I700 Atherosclerosis of aorta: Secondary | ICD-10-CM | POA: Diagnosis not present

## 2021-11-22 DIAGNOSIS — R918 Other nonspecific abnormal finding of lung field: Secondary | ICD-10-CM | POA: Diagnosis not present

## 2021-11-22 DIAGNOSIS — L8990 Pressure ulcer of unspecified site, unspecified stage: Secondary | ICD-10-CM | POA: Insufficient documentation

## 2021-11-22 DIAGNOSIS — Z7189 Other specified counseling: Secondary | ICD-10-CM | POA: Diagnosis not present

## 2021-11-22 DIAGNOSIS — Z9221 Personal history of antineoplastic chemotherapy: Secondary | ICD-10-CM

## 2021-11-22 DIAGNOSIS — J984 Other disorders of lung: Secondary | ICD-10-CM | POA: Diagnosis not present

## 2021-11-22 DIAGNOSIS — J9600 Acute respiratory failure, unspecified whether with hypoxia or hypercapnia: Secondary | ICD-10-CM

## 2021-11-22 LAB — BASIC METABOLIC PANEL
Anion gap: 11 (ref 5–15)
BUN: 10 mg/dL (ref 8–23)
CO2: 22 mmol/L (ref 22–32)
Calcium: 8.6 mg/dL — ABNORMAL LOW (ref 8.9–10.3)
Chloride: 98 mmol/L (ref 98–111)
Creatinine, Ser: 0.66 mg/dL (ref 0.61–1.24)
GFR, Estimated: >60 mL/min (ref >60)
Glucose, Bld: 154 mg/dL — ABNORMAL HIGH (ref 70–99)
Potassium: 3.4 mmol/L — ABNORMAL LOW (ref 3.5–5.1)
Sodium: 131 mmol/L — ABNORMAL LOW (ref 135–145)

## 2021-11-22 LAB — CMP (CANCER CENTER ONLY)
ALT: 12 U/L (ref 0–44)
AST: 19 U/L (ref 15–41)
Albumin: 2.9 g/dL — ABNORMAL LOW (ref 3.5–5.0)
Alkaline Phosphatase: 137 U/L — ABNORMAL HIGH (ref 38–126)
Anion gap: 5 (ref 5–15)
BUN: 10 mg/dL (ref 8–23)
CO2: 29 mmol/L (ref 22–32)
Calcium: 8.6 mg/dL — ABNORMAL LOW (ref 8.9–10.3)
Chloride: 99 mmol/L (ref 98–111)
Creatinine: 0.57 mg/dL — ABNORMAL LOW (ref 0.61–1.24)
GFR, Estimated: >60 mL/min (ref >60)
Glucose, Bld: 78 mg/dL (ref 70–99)
Potassium: 3.9 mmol/L (ref 3.5–5.1)
Sodium: 133 mmol/L — ABNORMAL LOW (ref 135–145)
Total Bilirubin: 0.5 mg/dL (ref 0.3–1.2)
Total Protein: 5.8 g/dL — ABNORMAL LOW (ref 6.5–8.1)

## 2021-11-22 LAB — CBC WITH DIFFERENTIAL (CANCER CENTER ONLY)
Abs Immature Granulocytes: 0.27 10*3/uL — ABNORMAL HIGH (ref 0.00–0.07)
Basophils Absolute: 0.2 10*3/uL — ABNORMAL HIGH (ref 0.0–0.1)
Basophils Relative: 1 %
Eosinophils Absolute: 0.3 10*3/uL (ref 0.0–0.5)
Eosinophils Relative: 3 %
HCT: 29.2 % — ABNORMAL LOW (ref 39.0–52.0)
Hemoglobin: 9.9 g/dL — ABNORMAL LOW (ref 13.0–17.0)
Immature Granulocytes: 2 %
Lymphocytes Relative: 12 %
Lymphs Abs: 1.3 10*3/uL (ref 0.7–4.0)
MCH: 33.2 pg (ref 26.0–34.0)
MCHC: 33.9 g/dL (ref 30.0–36.0)
MCV: 98 fL (ref 80.0–100.0)
Monocytes Absolute: 1 10*3/uL (ref 0.1–1.0)
Monocytes Relative: 9 %
Neutro Abs: 8.1 10*3/uL — ABNORMAL HIGH (ref 1.7–7.7)
Neutrophils Relative %: 73 %
Platelet Count: 224 10*3/uL (ref 150–400)
RBC: 2.98 MIL/uL — ABNORMAL LOW (ref 4.22–5.81)
RDW: 15.9 % — ABNORMAL HIGH (ref 11.5–15.5)
WBC Count: 11.2 10*3/uL — ABNORMAL HIGH (ref 4.0–10.5)
nRBC: 0 % (ref 0.0–0.2)

## 2021-11-22 LAB — BLOOD GAS, ARTERIAL
Acid-base deficit: 4.9 mmol/L — ABNORMAL HIGH (ref 0.0–2.0)
Bicarbonate: 19.7 mmol/L — ABNORMAL LOW (ref 20.0–28.0)
Drawn by: 27021
FIO2: 100 %
O2 Saturation: 97.1 %
Patient temperature: 37
pCO2 arterial: 34 mmHg (ref 32–48)
pH, Arterial: 7.37 (ref 7.35–7.45)
pO2, Arterial: 92 mmHg (ref 83–108)

## 2021-11-22 LAB — GLUCOSE, CAPILLARY
Glucose-Capillary: 124 mg/dL — ABNORMAL HIGH (ref 70–99)
Glucose-Capillary: 158 mg/dL — ABNORMAL HIGH (ref 70–99)

## 2021-11-22 LAB — CBC WITH DIFFERENTIAL/PLATELET
Abs Immature Granulocytes: 0 10*3/uL (ref 0.00–0.07)
Basophils Absolute: 0 10*3/uL (ref 0.0–0.1)
Basophils Relative: 0 %
Eosinophils Absolute: 0 10*3/uL (ref 0.0–0.5)
Eosinophils Relative: 0 %
HCT: 38.1 % — ABNORMAL LOW (ref 39.0–52.0)
Hemoglobin: 12.2 g/dL — ABNORMAL LOW (ref 13.0–17.0)
Lymphocytes Relative: 5 %
Lymphs Abs: 0.5 10*3/uL — ABNORMAL LOW (ref 0.7–4.0)
MCH: 32.4 pg (ref 26.0–34.0)
MCHC: 32 g/dL (ref 30.0–36.0)
MCV: 101.3 fL — ABNORMAL HIGH (ref 80.0–100.0)
Monocytes Absolute: 0.4 10*3/uL (ref 0.1–1.0)
Monocytes Relative: 4 %
Neutro Abs: 8.2 10*3/uL — ABNORMAL HIGH (ref 1.7–7.7)
Neutrophils Relative %: 91 %
Platelets: 257 10*3/uL (ref 150–400)
RBC: 3.76 MIL/uL — ABNORMAL LOW (ref 4.22–5.81)
RDW: 15.9 % — ABNORMAL HIGH (ref 11.5–15.5)
WBC: 9 10*3/uL (ref 4.0–10.5)
nRBC: 0.2 % (ref 0.0–0.2)

## 2021-11-22 LAB — TROPONIN I (HIGH SENSITIVITY)
Troponin I (High Sensitivity): 19 ng/L — ABNORMAL HIGH (ref <18)
Troponin I (High Sensitivity): 27 ng/L — ABNORMAL HIGH (ref <18)

## 2021-11-22 LAB — HEPARIN LEVEL (UNFRACTIONATED): Heparin Unfractionated: 0.96 IU/mL — ABNORMAL HIGH (ref 0.30–0.70)

## 2021-11-22 LAB — PROTIME-INR
INR: 1.3 — ABNORMAL HIGH (ref 0.8–1.2)
Prothrombin Time: 16.4 seconds — ABNORMAL HIGH (ref 11.4–15.2)

## 2021-11-22 LAB — LIPASE, BLOOD: Lipase: 21 U/L (ref 11–51)

## 2021-11-22 LAB — APTT: aPTT: 28 seconds (ref 24–36)

## 2021-11-22 LAB — RESP PANEL BY RT-PCR (FLU A&B, COVID) ARPGX2
Influenza A by PCR: NEGATIVE
Influenza B by PCR: NEGATIVE
SARS Coronavirus 2 by RT PCR: NEGATIVE

## 2021-11-22 LAB — TSH: TSH: 1.049 u[IU]/mL (ref 0.320–4.118)

## 2021-11-22 LAB — HEMOGLOBIN A1C
Hgb A1c MFr Bld: 4.8 % (ref 4.8–5.6)
Mean Plasma Glucose: 91.06 mg/dL

## 2021-11-22 LAB — LACTIC ACID, PLASMA
Lactic Acid, Venous: 2.1 mmol/L (ref 0.5–1.9)
Lactic Acid, Venous: 2.4 mmol/L (ref 0.5–1.9)

## 2021-11-22 LAB — MAGNESIUM: Magnesium: 1.8 mg/dL (ref 1.7–2.4)

## 2021-11-22 LAB — PROCALCITONIN: Procalcitonin: 14.68 ng/mL

## 2021-11-22 LAB — BRAIN NATRIURETIC PEPTIDE: B Natriuretic Peptide: 95.9 pg/mL (ref 0.0–100.0)

## 2021-11-22 MED ORDER — METHYLPREDNISOLONE SODIUM SUCC 40 MG IJ SOLR
40.0000 mg | Freq: Two times a day (BID) | INTRAMUSCULAR | Status: DC
  Administered 2021-11-22 – 2021-11-25 (×7): 40 mg via INTRAVENOUS
  Filled 2021-11-22 (×8): qty 1

## 2021-11-22 MED ORDER — ASPIRIN 81 MG PO CHEW
CHEWABLE_TABLET | ORAL | Status: AC
  Filled 2021-11-22: qty 3

## 2021-11-22 MED ORDER — LEUCOVORIN CALCIUM INJECTION 350 MG
400.0000 mg/m2 | Freq: Once | INTRAVENOUS | Status: AC
  Administered 2021-11-22: 732 mg via INTRAVENOUS
  Filled 2021-11-22: qty 36.6

## 2021-11-22 MED ORDER — SODIUM CHLORIDE 0.9 % IV SOLN
240.0000 mg | Freq: Once | INTRAVENOUS | Status: AC
  Administered 2021-11-22: 240 mg via INTRAVENOUS
  Filled 2021-11-22: qty 24

## 2021-11-22 MED ORDER — SODIUM CHLORIDE 0.9 % IV SOLN: Freq: Once | INTRAVENOUS | Status: AC

## 2021-11-22 MED ORDER — SODIUM CHLORIDE 0.9% FLUSH
10.0000 mL | Freq: Once | INTRAVENOUS | Status: AC
  Administered 2021-11-22: 10 mL

## 2021-11-22 MED ORDER — VANCOMYCIN HCL 1500 MG/300ML IV SOLN: 1500.0000 mg | Freq: Every day | INTRAVENOUS | Status: DC

## 2021-11-22 MED ORDER — MAGNESIUM SULFATE 2 GM/50ML IV SOLN
2.0000 g | Freq: Once | INTRAVENOUS | Status: AC
  Administered 2021-11-22: 2 g via INTRAVENOUS
  Filled 2021-11-22: qty 50

## 2021-11-22 MED ORDER — HEPARIN (PORCINE) 25000 UT/250ML-% IV SOLN
1000.0000 [IU]/h | INTRAVENOUS | Status: DC
Start: 2021-11-22 — End: 2021-11-23
  Administered 2021-11-22: 1000 [IU]/h via INTRAVENOUS
  Filled 2021-11-22: qty 250

## 2021-11-22 MED ORDER — DIPHENHYDRAMINE HCL 50 MG/ML IJ SOLN
25.0000 mg | Freq: Once | INTRAMUSCULAR | Status: AC
  Administered 2021-11-22: 25 mg via INTRAVENOUS
  Filled 2021-11-22: qty 1

## 2021-11-22 MED ORDER — EPINEPHRINE 0.3 MG/0.3ML IJ SOAJ
INTRAMUSCULAR | Status: AC
  Filled 2021-11-22: qty 0.3

## 2021-11-22 MED ORDER — FAMOTIDINE IN NACL 20-0.9 MG/50ML-% IV SOLN
20.0000 mg | Freq: Once | INTRAVENOUS | Status: AC
  Administered 2021-11-22: 20 mg via INTRAVENOUS
  Filled 2021-11-22: qty 50

## 2021-11-22 MED ORDER — FLUOROURACIL CHEMO INJECTION 2.5 GM/50ML
400.0000 mg/m2 | Freq: Once | INTRAVENOUS | Status: AC
  Administered 2021-11-22: 750 mg via INTRAVENOUS
  Filled 2021-11-22: qty 15

## 2021-11-22 MED ORDER — HEPARIN BOLUS VIA INFUSION
4200.0000 [IU] | Freq: Once | INTRAVENOUS | Status: AC
  Administered 2021-11-22: 4200 [IU] via INTRAVENOUS
  Filled 2021-11-22: qty 4200

## 2021-11-22 MED ORDER — DOCUSATE SODIUM 100 MG PO CAPS: 100.0000 mg | ORAL_CAPSULE | Freq: Two times a day (BID) | ORAL | Status: DC | PRN

## 2021-11-22 MED ORDER — ACETAMINOPHEN 650 MG RE SUPP
650.0000 mg | Freq: Four times a day (QID) | RECTAL | Status: DC | PRN
  Administered 2021-11-22 – 2021-11-23 (×2): 650 mg via RECTAL
  Filled 2021-11-22 (×2): qty 1

## 2021-11-22 MED ORDER — POTASSIUM CHLORIDE 10 MEQ/100ML IV SOLN
10.0000 meq | INTRAVENOUS | Status: AC
  Administered 2021-11-22 – 2021-11-23 (×4): 10 meq via INTRAVENOUS
  Filled 2021-11-22 (×4): qty 100

## 2021-11-22 MED ORDER — POLYETHYLENE GLYCOL 3350 17 G PO PACK: 17.0000 g | PACK | Freq: Every day | ORAL | Status: DC | PRN

## 2021-11-22 MED ORDER — SODIUM CHLORIDE 0.9 % IV SOLN
10.0000 mg | Freq: Once | INTRAVENOUS | Status: AC
  Administered 2021-11-22: 10 mg via INTRAVENOUS
  Filled 2021-11-22: qty 10

## 2021-11-22 MED ORDER — NOREPINEPHRINE 4 MG/250ML-% IV SOLN
2.0000 ug/min | INTRAVENOUS | Status: DC
  Administered 2021-11-22: 2 ug/min via INTRAVENOUS
  Filled 2021-11-22: qty 250

## 2021-11-22 MED ORDER — DEXTROSE 5 % IV SOLN: Freq: Once | INTRAVENOUS | Status: AC

## 2021-11-22 MED ORDER — NOREPINEPHRINE 4 MG/250ML-% IV SOLN
0.0000 ug/min | INTRAVENOUS | Status: DC
  Administered 2021-11-22: 20:00:00 10 ug/min via INTRAVENOUS
  Administered 2021-11-23: 17:00:00 35 ug/min via INTRAVENOUS
  Administered 2021-11-23: 12 ug/min via INTRAVENOUS
  Administered 2021-11-23: 06:00:00 9 ug/min via INTRAVENOUS
  Administered 2021-11-23: 13:00:00 14 ug/min via INTRAVENOUS
  Filled 2021-11-22 (×5): qty 250

## 2021-11-22 MED ORDER — ORAL CARE MOUTH RINSE
15.0000 mL | Freq: Two times a day (BID) | OROMUCOSAL | Status: DC
  Administered 2021-11-22: 15 mL via OROMUCOSAL

## 2021-11-22 MED ORDER — SODIUM CHLORIDE 0.9 % IV SOLN
2.0000 g | Freq: Once | INTRAVENOUS | Status: AC
  Administered 2021-11-22: 2 g via INTRAVENOUS
  Filled 2021-11-22: qty 2

## 2021-11-22 MED ORDER — MAGNESIUM SULFATE 2 GM/50ML IV SOLN
INTRAVENOUS | Status: AC
  Filled 2021-11-22: qty 50

## 2021-11-22 MED ORDER — ASPIRIN 81 MG PO CHEW
324.0000 mg | CHEWABLE_TABLET | Freq: Once | ORAL | Status: AC
  Administered 2021-11-22: 324 mg via ORAL
  Filled 2021-11-22: qty 4

## 2021-11-22 MED ORDER — VANCOMYCIN HCL 1500 MG/300ML IV SOLN
1500.0000 mg | Freq: Once | INTRAVENOUS | Status: AC
  Administered 2021-11-22: 1500 mg via INTRAVENOUS
  Filled 2021-11-22: qty 300

## 2021-11-22 MED ORDER — SODIUM CHLORIDE 0.9 % IV SOLN: INTRAVENOUS | Status: DC | PRN

## 2021-11-22 MED ORDER — SODIUM CHLORIDE 0.9 % IV BOLUS
500.0000 mL | Freq: Once | INTRAVENOUS | Status: AC
  Administered 2021-11-22: 500 mL via INTRAVENOUS

## 2021-11-22 MED ORDER — METHYLPREDNISOLONE SODIUM SUCC 125 MG IJ SOLR
125.0000 mg | Freq: Once | INTRAMUSCULAR | Status: AC
  Administered 2021-11-22: 125 mg via INTRAVENOUS
  Filled 2021-11-22: qty 2

## 2021-11-22 MED ORDER — SODIUM CHLORIDE 0.9 % IV SOLN
2.0000 g | Freq: Three times a day (TID) | INTRAVENOUS | Status: DC
  Administered 2021-11-23 (×2): 2 g via INTRAVENOUS
  Filled 2021-11-22 (×2): qty 2

## 2021-11-22 MED ORDER — PALONOSETRON HCL INJECTION 0.25 MG/5ML
0.2500 mg | Freq: Once | INTRAVENOUS | Status: AC
  Administered 2021-11-22: 0.25 mg via INTRAVENOUS
  Filled 2021-11-22: qty 5

## 2021-11-22 MED ORDER — ACETAMINOPHEN 325 MG PO TABS
650.0000 mg | ORAL_TABLET | Freq: Four times a day (QID) | ORAL | Status: DC | PRN
  Filled 2021-11-22: qty 2

## 2021-11-22 MED ORDER — SODIUM CHLORIDE 0.9 % IV SOLN
250.0000 mL | INTRAVENOUS | Status: DC
  Administered 2021-11-22: 250 mL via INTRAVENOUS

## 2021-11-22 MED ORDER — EPINEPHRINE 0.3 MG/0.3ML IJ SOAJ
0.3000 mg | Freq: Once | INTRAMUSCULAR | Status: AC
  Administered 2021-11-22: 0.3 mg via INTRAMUSCULAR

## 2021-11-22 MED ORDER — SODIUM CHLORIDE 0.9 % IV SOLN
2400.0000 mg/m2 | INTRAVENOUS | Status: DC
  Filled 2021-11-22: qty 88

## 2021-11-22 MED ORDER — INSULIN ASPART 100 UNIT/ML IJ SOLN
0.0000 [IU] | INTRAMUSCULAR | Status: DC
  Administered 2021-11-22: 2 [IU] via SUBCUTANEOUS
  Administered 2021-11-23 (×4): 3 [IU] via SUBCUTANEOUS
  Administered 2021-11-23: 08:00:00 2 [IU] via SUBCUTANEOUS
  Administered 2021-11-23: 21:00:00 5 [IU] via SUBCUTANEOUS
  Administered 2021-11-24: 3 [IU] via SUBCUTANEOUS
  Administered 2021-11-24: 8 [IU] via SUBCUTANEOUS
  Administered 2021-11-24: 5 [IU] via SUBCUTANEOUS
  Filled 2021-11-22: qty 0.15

## 2021-11-22 MED ORDER — OXALIPLATIN CHEMO INJECTION 100 MG/20ML
85.0000 mg/m2 | Freq: Once | INTRAVENOUS | Status: AC
  Administered 2021-11-22: 155 mg via INTRAVENOUS
  Filled 2021-11-22: qty 31

## 2021-11-22 MED ORDER — LACTATED RINGERS IV BOLUS
1000.0000 mL | Freq: Once | INTRAVENOUS | Status: AC
  Administered 2021-11-22: 1000 mL via INTRAVENOUS

## 2021-11-22 NOTE — Progress Notes (Signed)
Pharmacy Antibiotic Note ? ?Zenas Santa. is a 76 y.o. male admitted on 12/11/2021 for possible anaphylactic reaction to chemotherapy infusion.  Pharmacy has been consulted for vancomycin and cefepime dosing to rule out sepsis.  ? ?Plan: ?Vancomycin 1500 mg IV q24 hr (est AUC 516 based on SCr rounded to 0.8; Vd 0.72) ?Measure vancomycin AUC at steady state as indicated ?SCr q48 while on vanc ?Cefepime 2g IV q8 hr ?Narrow quickly if does not appear infectious after 48 hr ? ?  ? ?Temp (24hrs), Avg:98.4 ?F (36.9 ?C), Min:98 ?F (36.7 ?C), Max:98.7 ?F (37.1 ?C) ? ?Recent Labs  ?Lab 11/27/2021 ?0753 11/18/2021 ?1456  ?WBC 11.2* 9.0  ?CREATININE 0.57* 0.66  ?  ?Estimated Creatinine Clearance: 69.4 mL/min (by C-G formula based on SCr of 0.66 mg/dL).   ? ?Allergies  ?Allergen Reactions  ? Iodinated Contrast Media Anaphylaxis  ? Moxifloxacin Swelling  ?  REACTION: GI upset, throat "tightened up"  ? Penicillins Anaphylaxis  ?  Remote anaphylaxis reaction (not childhood) requiring MD/hospital care ?- tolerates Ceftin, Rocephin, Cefepime ?  ? Shellfish-Derived Products Anaphylaxis  ? Amoxicillin Swelling  ?  Throat tightened up ?Tolerates Ceftin, Rocephin, Cefepime  ? Chlorhexidine Other (See Comments)  ?  Wife is not aware of this allergy  ? Latex Rash  ?  Reaction to prolonged exposure  ? ? ?Antimicrobials this admission: ?3/8 vancomycin >>  ?3/8 cefepime >>  ? ?Dose adjustments this admission: ? ?Microbiology results: ?Orders pending ? ?Thank you for allowing pharmacy to be a part of this patient?s care. ? ?Jhett Fretwell A ?12/14/2021 5:59 PM ? ?

## 2021-11-22 NOTE — Progress Notes (Signed)
RT NOTE:  ABG obtained and sent to lab. Lab notified. 

## 2021-11-22 NOTE — Progress Notes (Signed)
eLink Physician-Brief Progress Note ?Patient Name: Justin Trujillo. ?DOB: March 10, 1946 ?MRN: [id number] ? ? ?Date of Service ? 11/25/2021  ?HPI/Events of Note ? Patient has port that can be accessed. Nursing request to change Norepinephrine IV infusion order from PIV to central line.   ?eICU Interventions ? Plan: ?Change Norepinephrine IV infusion to central line dosing.   ? ? ? ?Intervention Category ?Major Interventions: Hypotension - evaluation and management ? ?[name] ?12/03/2021, 8:26 PM ?

## 2021-11-22 NOTE — Patient Instructions (Signed)
Justin Trujillo  Discharge Instructions: ?Thank you for choosing [facility] to provide your oncology and hematology care.  ? ?If you have a lab appointment with the [facility], please go directly to the [facility] and check in at the registration area. ?  ?Wear comfortable clothing and clothing appropriate for easy access to any Portacath or PICC line.  ? ?We strive to give you quality time with your provider. You may need to reschedule your appointment if you arrive late (15 or more minutes).  Arriving late affects you and other patients whose appointments are after yours.  Also, if you miss three or more appointments without notifying the office, you may be dismissed from the clinic at the provider?s discretion.    ?  ?For prescription refill requests, have your pharmacy contact our office and allow 72 hours for refills to be completed.   ? ?Today you received the following chemotherapy and/or immunotherapy agents: Opdivo, oxaliplatin, leucorvin, adrucil    ?  ?To help prevent nausea and vomiting after your treatment, we encourage you to take your nausea medication as directed. ? ?BELOW ARE SYMPTOMS THAT SHOULD BE REPORTED IMMEDIATELY: ?*FEVER GREATER THAN 100.4 F (38 ?C) OR HIGHER ?*CHILLS OR SWEATING ?*NAUSEA AND VOMITING THAT IS NOT CONTROLLED WITH YOUR NAUSEA MEDICATION ?*UNUSUAL SHORTNESS OF BREATH ?*UNUSUAL BRUISING OR BLEEDING ?*URINARY PROBLEMS (pain or burning when urinating, or frequent urination) ?*BOWEL PROBLEMS (unusual diarrhea, constipation, pain near the anus) ?TENDERNESS IN MOUTH AND THROAT WITH OR WITHOUT PRESENCE OF ULCERS (sore throat, sores in mouth, or a toothache) ?UNUSUAL RASH, SWELLING OR PAIN  ?UNUSUAL VAGINAL DISCHARGE OR ITCHING  ? ?Items with * indicate a potential emergency and should be followed up as soon as possible or go to the Emergency Department if any problems should occur. ? ?Please show the CHEMOTHERAPY ALERT CARD or  IMMUNOTHERAPY ALERT CARD at check-in to the Emergency Department and triage nurse. ? ?Should you have questions after your visit or need to cancel or reschedule your appointment, please contact [facility]  Dept: [phone]  and follow the prompts.  Office hours are 8:00 a.m. to 4:30 p.m. Monday - Friday. Please note that voicemails left after 4:00 p.m. may not be returned until the following business day.  We are closed weekends and major holidays. You have access to a nurse at all times for urgent questions. Please call the main number to the clinic Dept: [phone] and follow the prompts. ? ? ?For any non-urgent questions, you may also contact your provider using MyChart. We now offer e-Visits for anyone 12 and older to request care online for non-urgent symptoms. For details visit [email or web address]. ?  ?Also download the MyChart app! Go to the app store, search "MyChart", open the app, select [facility], and log in with your MyChart username and password. ? ?Due to Covid, a mask is required upon entering the hospital/clinic. If you do not have a mask, one will be given to you upon arrival. For doctor visits, patients may have 1 support person aged 28 or older with them. For treatment visits, patients cannot have anyone with them due to current Covid guidelines and our immunocompromised population.  ? ?

## 2021-11-22 NOTE — ED Notes (Signed)
RT at the bedside.

## 2021-11-22 NOTE — ED Notes (Signed)
Report given to Jeanine Luz, RN.  ?

## 2021-11-22 NOTE — ED Notes (Addendum)
Pt weaned to nasal cannula O2  3.5 L, by RT, per EDP's verbal order. Pt currently satting at 86%, [place] O2 increased to 6L by this nurse at this time with no improvement, RT paged. This nurse placing pt back on 15L non-rebreather as discussed with RT. EDP notified.  ?

## 2021-11-22 NOTE — Progress Notes (Signed)
Justin Trujillo [phone]   [phone]  PROGRESS NOTE  Patient Care Team: Carol Ada, MD as PCP - General (Family Medicine) Evans Lance, MD as PCP - Cardiology (Cardiology) Orson Slick, MD as Consulting Physician (Hematology and Oncology)  Hematological/Oncological History # Metastatic Adenocarcinoma of the Distal Esophagus 01/27/2021: dermatology performed a punch biopsy of the right superior lateral anterior neck. Finding show adenocarcinoma negative for PSA and cytokeratin 20.  02/06/2021: establish care with Dr. Lorenso Courier  02/15/2021: EGD found esophageal mass. Biopsy confirms poorly differentiated carcinoma 02/22/2021: PET CT scan shows FDG avid mass in distal esophagus and subcutaneous tissue or right neck.  03/22/2021-04/05/2021: palliative radiation to the esophageal mass.  04/26/2021: Cycle 1 Day 1 of FOLFOX + Nivolumab 05/10/2021: Cycle 2 Day 1 of FOLFOX + Nivolumab 05/24/2021: Cycle 3 Day 1 of FOLFOX + Nivolumab 06/07/2021: Cycle 4 Day 1 of FOLFOX + Nivolumab 06/23/2021: Cycle 5 Day 1 of FOLFOX + Nivolumab 07/05/2021: Cycle 6 Day 1 of FOLFOX + Nivolumab 08/15/2021: Cycle 7 Day 1 of FOLFOX + Nivolumab (delayed due to hospitalization for N/V and dehydration) 08/29/2021: Cycle 8 Day 1 of FOLFOX + Nivolumab  09/12/2021: Cycle 9 Day 1 of FOLFOX + Nivolumab  09/26/2021: cycle delayed due to COVID infection.  10/11/2021: Cycle 10 Day 1 of FOLFOX + Nivolumab  10/20/2021: CT C/A/P showed residual distal esophageal wall thickening. No definitive evidence metastatic disease 10/25/2021: Cycle 11 Day 1 of FOLFOX + Nivolumab  11/07/2021: HOLD Cycle 12 due to recent fall, neutropenia/anemia 12/11/2021: Cycle 12 Day 1 of FOLFOX + Nivolumab  Interval History:  Justin Trujillo. 76 y.o. male with medical history significant for metastatic esophageal adenocarcinoma who presents for a follow up visit. The patient's last visit was on 11/07/2021. In the interim, [name] had a 2 week hiatus from his last chemotherapy to rebound from his low blood counts and fall.   On exam today Justin Trujillo reports he continues to struggle with tiredness and weakness.  He notes that he knows he needs exercise and he needs to "get off his butt".  He notes that he has an home health PT scheduled but the agency only comes once per week and spends "most of the time talking".  He notes that generally his pain is under good control though his lower back does have occasional flareups.  He notes at baseline his pain is about a 3 out of 10 but can escalate to about a 6 or 7.  He notes his appetite has been quite good and his weight has been stable.  He is not having any issues with nausea, vomiting, or diarrhea.  He does have some occasional sensitivity to cold and continues to have some swelling in his lower extremities.  Additionally he is having extensive bruising on his arms due to the Eliquis therapy and had some issues with wound care following his prior fall.  He denies fevers, chills, night sweats, chest pain, cough, mucositis or rash. He has no other complaints. Rest of the 10 point ROS is below.  MEDICAL HISTORY:  Past Medical History:  Diagnosis Date   Arthritis    Cancer [facility])    Coronary atherosclerosis of native coronary artery    Depression    Esophageal cancer (HCC)    Essential tremor    High cholesterol    Hypertension    Hypertension    Hypogonadism male    Obstructive chronic bronchitis with exacerbation ([place])  Obstructive sleep apnea (adult) (pediatric)    no cpap   Other and unspecified hyperlipidemia    Other emphysema ([place])    Proteinuria 2019   has seen a nephrologist   Shortness of breath    with excertion   Situational stress    Type II or unspecified type diabetes mellitus without mention of complication, not stated as uncontrolled    type 2   Unspecified essential hypertension     SURGICAL HISTORY: Past Surgical History:  Procedure  Laterality Date   CARDIAC CATHETERIZATION     IR IMAGING GUIDED PORT INSERTION  04/24/2021   IR KYPHO LUMBAR INC FX REDUCE BONE BX UNI/BIL CANNULATION INC/IMAGING  06/13/2018   L lens implant     R knee replacement x2     REVERSE SHOULDER ARTHROPLASTY Left 03/26/2019   Procedure: REVERSE SHOULDER ARTHROPLASTY;  Surgeon: Justice Britain, MD;  Location: [facility];  Service: Orthopedics;  Laterality: Left;  138mn   TONSILLECTOMY     TONSILLECTOMY AND ADENOIDECTOMY     vascectomy      SOCIAL HISTORY: Social History   Socioeconomic History   Marital status: Married    Spouse name: Not on file   Number of children: Not on file   Years of education: Not on file   Highest education level: Not on file  Occupational History   Occupation: lLandscape architect Tobacco Use   Smoking status: Every Day    Packs/day: 1.00    Years: 55.00    Pack years: 55.00    Types: Cigarettes   Smokeless tobacco: Never  Vaping Use   Vaping Use: Never used  Substance and Sexual Activity   Alcohol use: Yes    Alcohol/week: 1.0 standard drink    Types: 1 Cans of beer per week    Comment: occasional beer   Drug use: Never   Sexual activity: Not Currently  Other Topics Concern   Not on file  Social History Narrative   ** Merged History Encounter **       Social Determinants of Health   Financial Resource Strain: Not on file  Food Insecurity: Not on file  Transportation Needs: Not on file  Physical Activity: Not on file  Stress: Not on file  Social Connections: Not on file  Intimate Partner Violence: Not on file    FAMILY HISTORY: Family History  Problem Relation Age of Onset   Emphysema Father    Heart disease Father    Clotting disorder Mother    Kidney cancer Child     ALLERGIES:  is allergic to iodinated contrast media, moxifloxacin, penicillins, shellfish-derived products, amoxicillin, chlorhexidine, and latex.  MEDICATIONS:  Current Outpatient Medications  Medication Sig  Dispense Refill   acetaminophen (TYLENOL) 325 MG tablet Take 2 tablets (650 mg total) by mouth every 6 (six) hours as needed for mild pain (or Fever >/= 101). (Patient not taking: Reported on 11/07/2021) 20 tablet 0   acetaminophen (TYLENOL) 650 MG CR tablet Take 650 mg by mouth every 6 (six) hours. Take with dilaudid, gabapentin and tizanidine - scheduled     albuterol (VENTOLIN HFA) 108 (90 Base) MCG/ACT inhaler Inhale 1 puff into the lungs every 6 (six) hours as needed for wheezing.     ALPRAZolam (XANAX) 0.5 MG tablet Take 0.5 mg by mouth 2 (two) times daily.     amiodarone (PACERONE) 200 MG tablet Take 2 tablets (400 mg total) by mouth 2 (two) times daily for 7 days, THEN 1 tablet (  200 mg total) daily. (Patient taking differently: Take one tablet (200 mg) by mouth every morning) 90 tablet 3   apixaban (ELIQUIS) 5 MG TABS tablet Take 1 tablet (5 mg total) by mouth 2 (two) times daily. 60 tablet 2   atorvastatin (LIPITOR) 40 MG tablet Take 1 tablet (40 mg total) by mouth daily. (Patient taking differently: Take 40 mg by mouth at bedtime.) 30 tablet 0   Cholecalciferol (VITAMIN D) 50 MCG (2000 UT) tablet Take 2,000 Units by mouth every morning.     escitalopram (LEXAPRO) 10 MG tablet Take 10 mg by mouth every morning.     feeding supplement, GLUCERNA SHAKE, (GLUCERNA SHAKE) LIQD Take 237 mLs by mouth 3 (three) times daily between meals. 2370 mL 0   gabapentin (NEURONTIN) 300 MG capsule Take 300 mg by mouth every 6 (six) hours. Take with tylenol, dilaudid, and tizanidine - scheduled     HYDROmorphone (DILAUDID) 8 MG tablet Take 8 mg by mouth every 6 (six) hours. Take with tylenol, gabapentin and tizanidine - scheduled     magnesium oxide (MAG-OX) 400 (240 Mg) MG tablet Take 1 tablet (400 mg total) by mouth daily. (Patient not taking: Reported on 11/07/2021) 30 tablet 0   Multiple Vitamin (MULTIVITAMIN WITH MINERALS) TABS tablet Take 1 tablet by mouth daily with lunch. Centrum Silver     MYRBETRIQ 50 MG  TB24 tablet Take 50 mg by mouth at bedtime.     ondansetron (ZOFRAN ODT) 4 MG disintegrating tablet Take 1 tablet (4 mg total) by mouth every 8 (eight) hours as needed for nausea or vomiting. (Patient not taking: Reported on 11/07/2021) 20 tablet 0   ondansetron (ZOFRAN) 4 MG tablet Take 1 tablet (4 mg total) by mouth every 6 (six) hours as needed for nausea. (Patient not taking: Reported on 11/07/2021) 20 tablet 0   primidone (MYSOLINE) 50 MG tablet Take 1 tablet (50 mg total) by mouth in the morning and at bedtime. 180 tablet 2   rOPINIRole (REQUIP) 0.5 MG tablet Take 0.5 mg by mouth at bedtime.     Semaglutide,0.25 or 0.5MG/DOS, (OZEMPIC, 0.25 OR 0.5 MG/DOSE,) 2 MG/1.5ML SOPN Inject 0.5 mg into the skin every Wednesday.     sodium chloride (OCEAN) 0.65 % SOLN nasal spray Place 1 spray into both nostrils as needed for congestion. (Patient not taking: Reported on 11/07/2021) 44 mL 0   sucralfate (CARAFATE) 1 GM/10ML suspension Take 10 mLs (1 g total) by mouth 4 (four) times daily -  with meals and at bedtime. (Patient not taking: Reported on 09/12/2021) 420 mL 2   tamsulosin (FLOMAX) 0.4 MG CAPS capsule Take 0.4 mg by mouth 2 (two) times daily.     testosterone cypionate (DEPOTESTOSTERONE CYPIONATE) 200 MG/ML injection Inject 200 mg into the muscle every 14 (fourteen) days. (Patient not taking: Reported on 11/07/2021)     tiZANidine (ZANAFLEX) 4 MG tablet Take 4 mg by mouth every 6 (six) hours. Take with tylenol, dilaudid and gabapentin - scheduled     trolamine salicylate (ASPERCREME) 10 % cream Apply 1 application topically 2 (two) times daily.     No current facility-administered medications for this visit.    REVIEW OF SYSTEMS:   Constitutional: ( - ) fevers, ( - )  chills , ( - ) night sweats Eyes: ( - ) blurriness of vision, ( - ) double vision, ( - ) watery eyes Ears, nose, mouth, throat, and face: ( - ) mucositis, ( - ) sore throat Respiratory: ( - )  cough, ( - ) dyspnea, ( - )  wheezes Cardiovascular: ( - ) palpitation, ( - ) chest discomfort, ( + ) lower extremity swelling Gastrointestinal:  ( - ) nausea, ( - ) heartburn, ( - ) change in bowel habits Skin: ( - ) abnormal skin rashes Lymphatics: ( - ) new lymphadenopathy, ( - ) easy bruising Neurological: ( - ) numbness, ( - ) tingling, ( - ) new weaknesses Behavioral/Psych: ( - ) mood change, ( - ) new changes  All other systems were reviewed with the patient and are negative.  PHYSICAL EXAMINATION: ECOG PERFORMANCE STATUS: 1 - Symptomatic but completely ambulatory  Vitals:   12/03/2021 0815  BP: 115/78  Pulse: 87  Resp: 16  Temp: 98 F (36.7 C)  SpO2: 98%   Filed Weights   12/01/2021 0815  Weight: 156 lb 1.6 oz (70.8 kg)    GENERAL: Well-appearing elderly Caucasian male, alert, no distress and comfortable SKIN: skin color, texture, turgor are normal, no rashes or significant lesions EYES: conjunctiva are pink and non-injected, sclera clear LUNGS: clear to auscultation and percussion with normal breathing effort HEART: regular rate & rhythm and no murmurs. Mild lower extremity edema, improved.  PSYCH: alert & oriented x 3, fluent speech NEURO: no focal motor/sensory deficits  LABORATORY DATA:  I have reviewed the data as listed CBC Latest Ref Rng & Units 12/09/2021 11/07/2021 10/30/2021  WBC 4.0 - 10.5 K/uL 11.2(H) 1.9(L) 1.7(L)  Hemoglobin 13.0 - 17.0 g/dL 9.9(L) 9.5(L) 9.2(L)  Hematocrit 39.0 - 52.0 % 29.2(L) 28.6(L) 28.0(L)  Platelets 150 - 400 K/uL 224 105(L) 97(L)    CMP Latest Ref Rng & Units 11/07/2021 10/30/2021 10/29/2021  Glucose 70 - 99 mg/dL 70 99 105(H)  BUN 8 - 23 mg/dL 12 8 7(L)  Creatinine 0.61 - 1.24 mg/dL 0.58(L) 0.79 0.66  Sodium 135 - 145 mmol/L 133(L) 131(L) 131(L)  Potassium 3.5 - 5.1 mmol/L 3.3(L) 3.9 3.8  Chloride 98 - 111 mmol/L 101 101 103  CO2 22 - 32 mmol/L _0 Calcium 8.9 - 10.3 mg/dL 8.8(L) 7.9(L) 7.9(L)  Total Protein 6.5 - 8.1 g/dL 5.8(L) 4.8(L) 4.9(L)  Total  Bilirubin 0.3 - 1.2 mg/dL 0.4 0.5 0.4  Alkaline Phos 38 - 126 U/L 99 63 59  AST 15 - 41 U/L _1 ALT 0 - 44 U/L _2 RADIOGRAPHIC STUDIES: I have personally reviewed the radiological images as listed and agreed with the findings in the report: FDG avid distal esophagus as well as subcutaneous none lymph node region in the right neck. CT HEAD WO CONTRAST  Result Date: 10/26/2021 CLINICAL DATA:  Moderate to severe head trauma EXAM: CT HEAD WITHOUT CONTRAST CT CERVICAL SPINE WITHOUT CONTRAST TECHNIQUE: Multidetector CT imaging of the head and cervical spine was performed following the standard protocol without intravenous contrast. Multiplanar CT image reconstructions of the cervical spine were also generated. RADIATION DOSE REDUCTION: This exam was performed according to the departmental dose-optimization program which includes automated exposure control, adjustment of the mA and/or kV according to patient size and/or use of iterative reconstruction technique. COMPARISON:  None. FINDINGS: CT HEAD FINDINGS Brain: No evidence of acute infarction, hemorrhage, hydrocephalus, extra-axial collection or mass lesion/mass effect. 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 Cerebral volume loss, central predominant. Small, incidental calcification in the inter hemispheric fissure. Vascular: No hyperdense vessel or unexpected calcification. Skull: No acute fracture. Sinuses/Orbits: Bilateral cataract resection. Fluid levels that are low-density  in the left maxillary and sphenoid sinuses. Left mastoid and middle ear opacification that is subtotal. CT CERVICAL SPINE FINDINGS Alignment: Reversal of cervical lordosis. Skull base and vertebrae: Streak artifact intermittent motion affects visualization, streak artifact heavily affecting the C1 and C2 levels. No convincing fracture. Generalized osteopenia. Soft tissues and spinal canal: No prevertebral fluid or swelling. No visible canal  hematoma. Disc levels: Generalized degenerative disc narrowing and ridging. Multilevel facet spurring. Upper chest: Negative IMPRESSION: 1. No evidence of acute intracranial injury. Negative for cervical spine fracture. 2. Left-sided sinusitis with fluid levels. Left mastoid and middle ear opacification. 3. 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 Electronically Signed   By: Jorje Guild M.D.   On: 10/26/2021 10:26   CT CERVICAL SPINE WO CONTRAST  Result Date: 10/26/2021 CLINICAL DATA:  Moderate to severe head trauma EXAM: CT HEAD WITHOUT CONTRAST CT CERVICAL SPINE WITHOUT CONTRAST TECHNIQUE: Multidetector CT imaging of the head and cervical spine was performed following the standard protocol without intravenous contrast. Multiplanar CT image reconstructions of the cervical spine were also generated. RADIATION DOSE REDUCTION: This exam was performed according to the departmental dose-optimization program which includes automated exposure control, adjustment of the mA and/or kV according to patient size and/or use of iterative reconstruction technique. COMPARISON:  None. FINDINGS: CT HEAD FINDINGS Brain: No evidence of acute infarction, hemorrhage, hydrocephalus, extra-axial collection or mass lesion/mass effect. 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 Cerebral volume loss, central predominant. Small, incidental calcification in the inter hemispheric fissure. Vascular: No hyperdense vessel or unexpected calcification. Skull: No acute fracture. Sinuses/Orbits: Bilateral cataract resection. Fluid levels that are low-density in the left maxillary and sphenoid sinuses. Left mastoid and middle ear opacification that is subtotal. CT CERVICAL SPINE FINDINGS Alignment: Reversal of cervical lordosis. Skull base and vertebrae: Streak artifact intermittent motion affects visualization, streak artifact heavily affecting the C1 and C2 levels. No convincing fracture. Generalized  osteopenia. Soft tissues and spinal canal: No prevertebral fluid or swelling. No visible canal hematoma. Disc levels: Generalized degenerative disc narrowing and ridging. Multilevel facet spurring. Upper chest: Negative IMPRESSION: 1. No evidence of acute intracranial injury. Negative for cervical spine fracture. 2. Left-sided sinusitis with fluid levels. Left mastoid and middle ear opacification. 3. 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 Electronically Signed   By: Jorje Guild M.D.   On: 10/26/2021 10:26   DG Pelvis Portable  Result Date: 10/26/2021 CLINICAL DATA:  Found down.  Weakness EXAM: PORTABLE PELVIS 1-2 VIEWS COMPARISON:  10/20/2021 FINDINGS: Bones appear demineralized. There is no evidence of pelvic fracture or diastasis. No pelvic bone lesions are seen. Large volume of stool projects throughout the visualized colon. Atherosclerotic vascular calcifications are present. IMPRESSION: Negative. Electronically Signed   By: Davina Poke D.O.   On: 10/26/2021 09:33   DG CHEST PORT 1 VIEW  Result Date: 10/30/2021 CLINICAL DATA:  Dyspnea EXAM: PORTABLE CHEST 1 VIEW COMPARISON:  Chest radiograph from one day prior. FINDINGS: Right internal jugular Port-A-Cath terminates in the middle third of the SVC. Right shoulder hemiarthroplasty and partially visualized left shoulder total arthroplasty. Stable cardiomediastinal silhouette with mild cardiomegaly. No pneumothorax. No pleural effusion. Hazy streaky opacities in peripheral mid to lower left lung, similar. Largely resolved hazy right lung base opacities. IMPRESSION: 1. Largely resolved hazy right lung base opacities. 2. Stable hazy streaky opacities in the peripheral mid to lower left lung, favoring pneumonia. 3. Stable cardiomegaly. Electronically Signed   By: Janina Mayo.D.  On: 10/30/2021 08:05   DG CHEST PORT 1 VIEW  Result Date: 10/29/2021 CLINICAL DATA:  76 year old male with history of shortness of breath. EXAM:  PORTABLE CHEST 1 VIEW COMPARISON:  Chest x-ray 10/28/2021. FINDINGS: Right internal jugular single-lumen power porta cath with tip terminating in the mid superior vena cava. Lung volumes are slightly low. Diffuse peribronchial cuffing and widespread interstitial prominence, with patchy ill-defined peripheral predominant airspace disease, similar to the prior study. No pneumothorax. No suspicious appearing pulmonary nodules or masses are noted. Heart size is normal. Upper mediastinal contours are within normal limits. Status post bilateral shoulder arthroplasties. IMPRESSION: 1. The appearance the chest again suggests multilobar bilateral bronchopneumonia, with similar aeration compared to the prior examination. 2. Postoperative changes and support apparatus, as above. Electronically Signed   By: Vinnie Langton M.D.   On: 10/29/2021 09:23   DG CHEST PORT 1 VIEW  Result Date: 10/28/2021 CLINICAL DATA:  Shortness of breath Evaluate for pneumonia EXAM: PORTABLE CHEST 1 VIEW COMPARISON:  10/26/2021 FINDINGS: Unchanged mild cardiomegaly. Right chest port is unchanged in position. Bilateral shoulder prostheses again seen. Mild pulmonary vascular congestion is unchanged. Peripheral lung opacities are not significantly changed compared to prior CT exam. IMPRESSION: 1. Unchanged mild cardiomegaly. 2. Unchanged bilateral subpleural parenchymal opacities. Electronically Signed   By: Miachel Roux M.D.   On: 10/28/2021 13:42   DG Chest Port 1 View  Result Date: 10/26/2021 CLINICAL DATA:  Family found the patient on the floor, last seen yesterday after chemotherapy when patient complained of weakness. Patient does not remember falling. EXAM: PORTABLE CHEST 1 VIEW COMPARISON:  CT examination dated October 20, 2021 FINDINGS: The heart is enlarged. There are bilateral lower lobe opacities, left worse than the right concerning for pneumonia including aspiration pneumonia. Bilateral shoulder arthroplasty. Right IJ access  MediPort with distal tip in the SVC. IMPRESSION: 1. Stable cardiomegaly. 2. Bilateral lower lobe opacities, left worse than the right concerning for pneumonia including aspiration pneumonia. Follow-up examination to resolution is recommended. Electronically Signed   By: Keane Police D.O.   On: 10/26/2021 09:35   CT CHEST ABDOMEN PELVIS WO CONTRAST  Result Date: 10/26/2021 CLINICAL DATA:  76 year old male presents for evaluation following fall. Patient also with history of esophageal neoplasm. EXAM: CT CHEST, ABDOMEN AND PELVIS WITHOUT CONTRAST TECHNIQUE: Multidetector CT imaging of the chest, abdomen and pelvis was performed following the standard protocol without IV contrast. RADIATION DOSE REDUCTION: This exam was performed according to the departmental dose-optimization program which includes automated exposure control, adjustment of the mA and/or kV according to patient size and/or use of iterative reconstruction technique. COMPARISON:  October 20, 2021. FINDINGS: CT CHEST FINDINGS Cardiovascular: The aorta shows smooth contours without adjacent stranding. No mediastinal hematoma. Aortic dilation to approximately 4 cm greatest axial dimension is unchanged. Generalized aortic atherosclerosis. Signs of mitral annular calcification. Three-vessel coronary artery disease. No substantial pericardial effusion, stable small pericardial effusion when compared to previous imaging from just 6 days ago RIGHT-sided Port-A-Cath terminates at the caval to atrial junction. Mediastinum/Nodes: Patulous esophagus similar to the prior exam. Mild thickening of the distal esophagus also unchanged in this patient with known history of esophageal neoplasm. No mediastinal hematoma or adenopathy. No axillary or thoracic inlet adenopathy. Lungs/Pleura: No pneumothorax. Reticular opacities and nodular changes along the pleural surface with increasing conspicuity since previous imaging. Pleural based airspace disease with nodular  features at the lung bases and along the anterior chest, these findings have developed since the previous exam of just 6  days ago. No pneumothorax. Material and the trachea and material layering in the mainstem bronchi extending into peripheral bronchi with signs of bronchial wall thickening. Musculoskeletal: See below for full musculoskeletal details. CT ABDOMEN PELVIS FINDINGS Hepatobiliary: Liver with smooth contours. Trace fluid adjacent to medial RIGHT hemi liver similar to previous imaging. No gross signs of hepatic trauma. No Peri hepatic fluid. No pericholecystic stranding. Pancreas: Pancreatic atrophy without signs of adjacent inflammation. Spleen: Trace stranding adjacent to the spleen. Minimal fluid adjacent to the spleen is similar to the study of October 20, 2021. No gross splenic abnormality. Adrenals/Urinary Tract: Mild bilateral adrenal thickening. Perinephric stranding bilaterally perhaps slightly increased compared to previous imaging. Generalized increase in fascial thickening and edema albeit mild since the previous study. No hydronephrosis.  No perinephric hematoma. Stomach/Bowel: Small hiatal hernia and distal esophageal thickening as described. No signs of bowel dilation to suggest obstruction of the small bowel. The appendix is normal. Colon is stool filled without signs of adjacent stranding. No signs of pneumatosis. No pneumoperitoneum. No pelvic fluid. Vascular/Lymphatic: Aortic atherosclerosis. No sign of aneurysm. Smooth contour of the IVC. There is no gastrohepatic or hepatoduodenal ligament lymphadenopathy. No retroperitoneal or mesenteric lymphadenopathy. No pelvic sidewall lymphadenopathy. Atherosclerosis is moderate to marked. Vascular structures not well assessed given the lack of intravenous contrast. No focal stranding or hematoma adjacent to the abdominal aorta or pelvic vessels. Reproductive: Unremarkable by CT. Urinary bladder is moderately distended extending to the sacral  promontory. Other: No fluid in the pelvis. Trace fluid about the liver and spleen similar to recent comparison imaging. Musculoskeletal: No displaced rib fractures. Mildly limited assessment due to respiratory motion. Bony pelvis without signs of fracture. Extensive spinal degenerative changes and osteopenia. Post bilateral shoulder arthroplasty. Visualized clavicles and scapulae are intact. Signs of prior L3-L4 spinal fusion and cement augmentation at L4 showing no change. Spinal compression fractures with similar appearance at the T11-T12 level based on recent comparison imaging with approximally 30% loss of height at these levels. Spinal alignment is unchanged. Sternum grossly intact with some artifact crossing the anterior sternum due to motion. IMPRESSION: 1. No evidence of acute traumatic injury to the chest, abdomen or pelvis. 2. Peripheral nodular parenchymal consolidative changes with marked worsening since the recent comparison imaging potentially post/drug treatment related given pattern seen and rapid interval progression. Correlate with any respiratory symptoms. Findings are felt to have an overlay of acute inflammatory changes related to aspiration given findings in the bronchi. However, subpleural changes extend along the anterior chest which would be atypical for purely aspiration related changes. 3. Small hiatal hernia and distal esophageal thickening also unchanged in this patient with known history of esophageal neoplasm. 4. Mild generalized edema with perinephric stranding and fascial edema in the abdomen not substantially changed compared to recent imaging. 5. Unchanged appearance of T11-T12 compression fractures. 6. Aortic atherosclerosis. Three-vessel coronary artery disease. 7. Ascending thoracic aortic aneurysm. Recommend annual imaging followup by CTA or MRA. This recommendation follows 2010 ACCF/AHA/AATS/ACR/ASA/SCA/SCAI/SIR/STS/SVM Guidelines for the Diagnosis and Management of Patients  with Thoracic Aortic Disease. Circulation. 2010; 121: F573-U202. Aortic aneurysm NOS (ICD10-I71.9) Aortic Atherosclerosis (ICD10-I70.0). Electronically Signed   By: Zetta Bills M.D.   On: 10/26/2021 10:31    ASSESSMENT & PLAN Justin Trujillo 76 y.o. male with medical history significant for metastatic esophageal adenocarcinoma who presents for a follow up visit. The patient is negative for HER2 but has a high TPS score.  Therefore he is an excellent candidate for FOLFOX with nivolumab.  The regimen of FOLFOX with nivolumab will be administered q 2 weeks with clinic visits prior to each infusion. This will be continued until progression or intolerance.   #Metastatic Adenocarcinoma of the Esophagus #Dysphagia -- Underwent palliative radiation in order to help alleviate his dysphagia. Patient now tolerating PO well.  --Unfortunately given the spread of this cancer to the skin we will need to treat this as metastatic disease with systemic therapy  -- Final testing on the tissue resulted with HER2 negative status, but TPS score of 60%.  Therefore he would be a good candidate for FOLFOX with nivolumab --Started Cycle 1 Day 1 of FOLFOX plus Nivolumab on 04/26/2021 with good tolerance.  --Restaging CT Scan form 10/20/2021 shows stable soft tissue thickening of the esophagus. No evidence of progression.  Plan:  --Labs today reviewed and adequate for treatment. WBC 11.2, Hgb 9.9, Plt 224 K. CMP reviewed and requires no intervention. --proceed with Cycle 12 Day 1 today.  Previously held due to neutropenia, anemia, and recent traumatic fall. --next CT scan in May 2023 --RTC in 2 weeks for Cycle 13   #Supportive Care -- chemotherapy education complete -- port placed -- zofran 55m q8H PRN and compazine 133mPO q6H for nausea -- EMLA cream for port  No orders of the defined types were placed in this encounter.  All questions were answered. The patient knows to call the clinic with any problems,  questions or concerns.  I have spent a total of 30 minutes minutes of face-to-face and non-face-to-face time, preparing to see the patient, performing a medically appropriate examination, counseling and educating the patient, documenting clinical information in the electronic health record, and care coordination.   Justin PeoplesMD Department of Hematology/Oncology [place] WeJennersville Regional Hospitalhone: [phone]: [phone]: [email or web address]  11/30/2021 8:19 AM

## 2021-11-22 NOTE — ED Notes (Signed)
RT paged to trial weaning pt from non-rebreather to nasal cannula, per EDP Dr. Beatriz Chancellor verbal order.  ?

## 2021-11-22 NOTE — ED Notes (Signed)
Critical Care at the bedside  

## 2021-11-22 NOTE — Progress Notes (Signed)
eLink Physician-Brief Progress Note ?Patient Name: Justin Trujillo. ?DOB: 02/15/1946 ?MRN: [id number] ? ? ?Date of Service ? 11/24/2021  ?HPI/Events of Note ? LA = 2.4 - LVEF = 65-70%.  ?eICU Interventions ? Plan: ?[place] with LR 1 liter IV over 1 hour now.   ? ? ? ?Intervention Category ?Major Interventions: Acid-Base disturbance - evaluation and management ? ?Yicel Shannon Cornelia Copa ?11/24/2021, 9:26 PM ?

## 2021-11-22 NOTE — ED Triage Notes (Signed)
Pt presents to the ED from the [facility] for "severe epigastric abd pain." Report received from cancer center RN. Per RN., pt was receiving Folfox infusion, when he began c/o epigastric abd pain and vomiting, pt was also SOB at that time, with sats in the mid 80's on room air. 4L nasaal cannula O2. Per RN., Folfox causes the pt severe sensitivity to cold. Per RN., pt has received this medication before without any issues. His last infusion prior to today was two weeks ago. Pt is being treated for Gastroesophageal cancer. Pt has a cardiac hx of known of A-fib, RN is unable to specify additional cardiac hx. Hx provided by [facility].  ?

## 2021-11-22 NOTE — ED Provider Notes (Signed)
[facility] DEPT Provider Note   CSN: [id number] Arrival date & time: 12/13/2021  1442     History  Chief Complaint  Patient presents with   Abdominal Pain    Epigastric abd pain; vomiting   Allergic Reaction    Justin Trujillo. is a 76 y.o. male.  HPI Called to room to see the patient for severe respiratory distress with hypoxia, while on nasal cannula oxygen.  I saw the patient at 2:57 PM.  At that time he was alert and on facemask oxygen, with oxygen saturation 73%.  Over short period of time, less than 2 minutes, his oxygen saturation improved to greater than 90%.  Nursing tells me at this time that he has been on the facemask oxygen, following 4 L of oxygen, for a total of 5 minutes.  Patient was being treated with chemotherapy agent, FOLFOX, when he developed sudden shortness of breath with hypoxia.  He apparently complained of abdominal discomfort.  When I saw the patient he was confused and unable to specify any of his symptoms.    Home Medications Prior to Admission medications   Medication Sig Start Date End Date Taking? Authorizing Provider  acetaminophen (TYLENOL) 325 MG tablet Take 2 tablets (650 mg total) by mouth every 6 (six) hours as needed for mild pain (or Fever >/= 101). Patient not taking: Reported on 11/07/2021 10/30/21   Raiford Noble Latif, DO  acetaminophen (TYLENOL) 650 MG CR tablet Take 650 mg by mouth every 6 (six) hours. Take with dilaudid, gabapentin and tizanidine - scheduled    [provider]  albuterol (VENTOLIN HFA) 108 (90 Base) MCG/ACT inhaler Inhale 1 puff into the lungs every 6 (six) hours as needed for wheezing. 04/24/21   [provider]  ALPRAZolam Duanne Moron) 0.5 MG tablet Take 0.5 mg by mouth 2 (two) times daily.    [provider]  amiodarone (PACERONE) 200 MG tablet Take 2 tablets (400 mg total) by mouth 2 (two) times daily for 7 days, THEN 1 tablet (200 mg total) daily. Patient taking  differently: Take one tablet (200 mg) by mouth every morning 08/01/21 08/08/22  Evans Lance, MD  apixaban (ELIQUIS) 5 MG TABS tablet Take 1 tablet (5 mg total) by mouth 2 (two) times daily. 11/09/21   Evans Lance, MD  atorvastatin (LIPITOR) 40 MG tablet Take 1 tablet (40 mg total) by mouth daily. Patient taking differently: Take 40 mg by mouth at bedtime. 07/23/12   Weber, Damaris Hippo, PA-C  Cholecalciferol (VITAMIN D) 50 MCG (2000 UT) tablet Take 2,000 Units by mouth every morning.    [provider]  escitalopram (LEXAPRO) 10 MG tablet Take 10 mg by mouth every morning.    [provider]  feeding supplement, GLUCERNA SHAKE, (GLUCERNA SHAKE) LIQD Take 237 mLs by mouth 3 (three) times daily between meals. 10/30/21   Raiford Noble Latif, DO  gabapentin (NEURONTIN) 300 MG capsule Take 300 mg by mouth every 6 (six) hours. Take with tylenol, dilaudid, and tizanidine - scheduled 10/19/21   [provider]  HYDROmorphone (DILAUDID) 8 MG tablet Take 8 mg by mouth every 6 (six) hours. Take with tylenol, gabapentin and tizanidine - scheduled 10/02/21   [provider]  magnesium oxide (MAG-OX) 400 (240 Mg) MG tablet Take 1 tablet (400 mg total) by mouth daily. Patient not taking: Reported on 11/07/2021 10/31/21   Kerney Elbe, DO  Multiple Vitamin (MULTIVITAMIN WITH MINERALS) TABS tablet Take 1 tablet by  mouth daily with lunch. Centrum Silver    [provider]  MYRBETRIQ 50 MG TB24 tablet Take 50 mg by mouth at bedtime. 12/26/20   [provider]  ondansetron (ZOFRAN ODT) 4 MG disintegrating tablet Take 1 tablet (4 mg total) by mouth every 8 (eight) hours as needed for nausea or vomiting. Patient not taking: Reported on 11/07/2021 07/21/21   Jonetta Osgood, MD  ondansetron (ZOFRAN) 4 MG tablet Take 1 tablet (4 mg total) by mouth every 6 (six) hours as needed for nausea. Patient not taking: Reported on 11/07/2021 10/30/21   Raiford Noble Latif, DO   primidone (MYSOLINE) 50 MG tablet Take 1 tablet (50 mg total) by mouth in the morning and at bedtime. 06/21/21   Tat, Eustace Quail, DO  rOPINIRole (REQUIP) 0.5 MG tablet Take 0.5 mg by mouth at bedtime.    [provider]  Semaglutide,0.25 or 0.'5MG'$ /DOS, (OZEMPIC, 0.25 OR 0.5 MG/DOSE,) 2 MG/1.5ML SOPN Inject 0.5 mg into the skin every Wednesday.    [provider]  sodium chloride (OCEAN) 0.65 % SOLN nasal spray Place 1 spray into both nostrils as needed for congestion. Patient not taking: Reported on 11/07/2021 10/30/21   Raiford Noble Latif, DO  sucralfate (CARAFATE) 1 GM/10ML suspension Take 10 mLs (1 g total) by mouth 4 (four) times daily -  with meals and at bedtime. Patient not taking: Reported on 09/12/2021 07/21/21   Jonetta Osgood, MD  tamsulosin (FLOMAX) 0.4 MG CAPS capsule Take 0.4 mg by mouth 2 (two) times daily. 01/03/21   [provider]  testosterone cypionate (DEPOTESTOSTERONE CYPIONATE) 200 MG/ML injection Inject 200 mg into the muscle every 14 (fourteen) days. Patient not taking: Reported on 11/07/2021    [provider]  tiZANidine (ZANAFLEX) 4 MG tablet Take 4 mg by mouth every 6 (six) hours. Take with tylenol, dilaudid and gabapentin - scheduled 01/23/21   [provider]  trolamine salicylate (ASPERCREME) 10 % cream Apply 1 application topically 2 (two) times daily.    [provider]      Allergies    Iodinated contrast media, Moxifloxacin, Penicillins, Shellfish-derived products, Amoxicillin, Chlorhexidine, and Latex    Review of Systems   Review of Systems  Physical Exam Updated Vital Signs BP (!) 93/57    Pulse (!) 126    Resp (!) 22    SpO2 100%  Physical Exam Vitals and nursing note reviewed.  Constitutional:      General: He is in acute distress.     Appearance: He is well-developed. He is ill-appearing. He is not toxic-appearing or diaphoretic.  HENT:     Head: Normocephalic and atraumatic.     Right Ear:  External ear normal.     Left Ear: External ear normal.  Eyes:     Conjunctiva/sclera: Conjunctivae normal.     Pupils: Pupils are equal, round, and reactive to light.  Neck:     Trachea: Phonation normal.  Cardiovascular:     Rate and Rhythm: Regular rhythm. Tachycardia present.     Heart sounds: Normal heart sounds.     Comments: No JVD Pulmonary:     Effort: Pulmonary effort is normal. No respiratory distress.     Breath sounds: Normal breath sounds. No stridor. No wheezing or rhonchi.     Comments: Equal bilateral air movement to the mid lung zones.  Somewhat diminished beneath that.  No increased work of breathing. Abdominal:     Palpations: Abdomen is soft.  Tenderness: There is no abdominal tenderness.  Musculoskeletal:        General: Normal range of motion.     Cervical back: Normal range of motion and neck supple.     Right lower leg: Edema present.     Left lower leg: Edema present.  Skin:    General: Skin is warm and dry.  Neurological:     Mental Status: He is alert and oriented to person, place, and time.     Cranial Nerves: No cranial nerve deficit.     Sensory: No sensory deficit.     Motor: No abnormal muscle tone.     Coordination: Coordination normal.  Psychiatric:        Behavior: Behavior normal.        Thought Content: Thought content normal.        Judgment: Judgment normal.    ED Results / Procedures / Treatments   Labs (all labs ordered are listed, but only abnormal results are displayed) Labs Reviewed  BASIC METABOLIC PANEL - Abnormal; Notable for the following components:      Result Value   Sodium 131 (*)    Potassium 3.4 (*)    Glucose, Bld 154 (*)    Calcium 8.6 (*)    All other components within normal limits  CBC WITH DIFFERENTIAL/PLATELET - Abnormal; Notable for the following components:   RBC 3.76 (*)    Hemoglobin 12.2 (*)    HCT 38.1 (*)    MCV 101.3 (*)    RDW 15.9 (*)    Neutro Abs 8.2 (*)    Lymphs Abs 0.5 (*)    All  other components within normal limits  BLOOD GAS, ARTERIAL - Abnormal; Notable for the following components:   Bicarbonate 19.7 (*)    Acid-base deficit 4.9 (*)    All other components within normal limits  RESP PANEL BY RT-PCR (FLU A&B, COVID) ARPGX2  CULTURE, BLOOD (ROUTINE X 2)  CULTURE, BLOOD (ROUTINE X 2)  URINE CULTURE  LIPASE, BLOOD  URINALYSIS, ROUTINE W REFLEX MICROSCOPIC  CBC  BASIC METABOLIC PANEL  MAGNESIUM  LACTIC ACID, PLASMA  LACTIC ACID, PLASMA  PROCALCITONIN  BRAIN NATRIURETIC PEPTIDE  TROPONIN I (HIGH SENSITIVITY)    EKG EKG Interpretation  Date/Time:  Wednesday November 22 2021 15:16:27 EST Ventricular Rate:  135 PR Interval:  86 QRS Duration: 150 QT Interval:  343 QTC Calculation: 515 R Axis:   183 Text Interpretation: Sinus tachycardia RBBB and LPFB Probable lateral infarct, old Since last tracing of earlier today No significant change was found Confirmed by Daleen Bo [phone]) on 12/03/2021 5:20:59 PM  Radiology DG Chest Port 1 View  Result Date: 12/01/2021 CLINICAL DATA:  Hypoxia, severe epigastric and abdominal pain. EXAM: PORTABLE CHEST 1 VIEW COMPARISON:  Radiographs dated October 30, 2021 FINDINGS: The heart is enlarged. Bilateral perihilar and lower lobe opacities concerning for airspace disease. Right IJ access Port-A-Cath with distal tip in the SVC, unchanged. Bilateral shoulder arthroplasty. IMPRESSION: 1.  Stable cardiomegaly. 2. Bilateral perihilar and lower lobe hazy opacities, which may represent pulmonary edema or infiltrate. Clinical correlation is suggested. Electronically Signed   By: Keane Police D.O.   On: 12/07/2021 15:40    Procedures Procedures    Medications Ordered in ED Medications  docusate sodium (COLACE) capsule 100 mg (has no administration in time range)  polyethylene glycol (MIRALAX / GLYCOLAX) packet 17 g (has no administration in time range)  magnesium sulfate IVPB 2 g 50 mL (has no administration  in time range)   EPINEPHrine (EPI-PEN) injection 0.3 mg (0.3 mg Intramuscular Given 12/05/2021 1520)  methylPREDNISolone sodium succinate (SOLU-MEDROL) 125 mg/2 mL injection 125 mg (125 mg Intravenous Given 11/20/2021 1525)  famotidine (PEPCID) IVPB 20 mg premix (20 mg Intravenous New Bag/Given 11/26/2021 1527)  diphenhydrAMINE (BENADRYL) injection 25 mg (25 mg Intravenous Given 11/27/2021 1525)  sodium chloride 0.9 % bolus 500 mL (500 mLs Intravenous New Bag/Given 11/24/2021 1532)    ED Course/ Medical Decision Making/ A&P Clinical Course as of 12/08/2021 1723  Wed Nov 22, 2021  1529 Epinephrine given for hypotension.  Patient's wife here now she is updated on findings and is currently speaking with the patient [EW]  1543 Mentation improved after epinephrine. [EW]    Clinical Course User Index [EW] Daleen Bo, MD                           Medical Decision Making Patient presenting with suspected allergic reaction after receiving 2 chemotherapy agents at the infusion center today.  On arrival he was confused, with hypotension and tachycardia.  Oxygen level was low and after treatment with facemask oxygen his saturations improved to 100%.  Amount and/or Complexity of Data Reviewed Independent Historian: caregiver    Details: Wife at bedside to give history. External Data Reviewed: labs, radiology and notes.    Details: He has esophageal cancer, being treated with chemotherapy infusions. Labs: ordered.    Details: CBC, metabolic panel, arterial blood gas, viral panel-normal except sodium low, potassium low, glucose high, calcium low, hemoglobin low, bicarb low Radiology: ordered and independent interpretation performed.    Details: Chest x-ray-nonspecific perihilar infiltrates on chest x-ray. ECG/medicine tests: ordered and independent interpretation performed.    Details: Cardiac monitor-sinus tachycardia Discussion of management or test interpretation with external provider(s): Case discussed with critical care  intensivist to arrange for admission.  Risk Prescription drug management. Decision regarding hospitalization. Risk Details: Patient presenting with signs of anaphylaxis, marginally improved after treatment including epinephrine, fluids, steroids and antihistamines.  Also she received IV fluid boluses.  Patient has transient improvement but then blood pressure diminished somewhat.  He remained alert.  Oxygen saturation stabilized with facemask oxygen.  Attempt to wean to nasal cannula, failed he was then placed back on facemask oxygen.  Intensivist consulted to admit patient to ICU for close management and consideration for vasopressor treatment.  He is at risk for decompensation if discharged.  It is unclear which agent that he had today because the anaphylaxis.  Critical Care Total time providing critical care: 30-74 minutes          Final Clinical Impression(s) / ED Diagnoses Final diagnoses:  Anaphylaxis, initial encounter    Rx / DC Orders ED Discharge Orders     None         Daleen Bo, MD 11/20/2021 1723

## 2021-11-22 NOTE — Progress Notes (Signed)
ANTICOAGULATION CONSULT NOTE - Initial Consult ? ?Pharmacy Consult for heparin ?Indication: atrial fibrillation ? ?Allergies  ?Allergen Reactions  ? Iodinated Contrast Media Anaphylaxis  ? Moxifloxacin Swelling  ?  REACTION: GI upset, throat "tightened up"  ? Penicillins Anaphylaxis  ?  Remote anaphylaxis reaction (not childhood) requiring MD/hospital care ?- tolerates Ceftin, Rocephin, Cefepime ?  ? Shellfish-Derived Products Anaphylaxis  ? Amoxicillin Swelling  ?  Throat tightened up ?Tolerates Ceftin, Rocephin, Cefepime  ? Chlorhexidine Other (See Comments)  ?  Wife is not aware of this allergy  ? Latex Rash  ?  Reaction to prolonged exposure  ? ? ?Patient Measurements: ?  ?Heparin Dosing Weight: 70.8 kg ? ?Vital Signs: ?Temp: 98.5 ?F (36.9 ?C) (03/08 1736) ?Temp Source: Oral (03/08 1736) ?BP: 86/58 (03/08 1759) ?Pulse Rate: 127 (03/08 1759) ? ?Labs: ?Recent Labs  ?  11/28/2021 ?0753 11/19/2021 ?1456  ?HGB 9.9* 12.2*  ?HCT 29.2* 38.1*  ?PLT 224 257  ?CREATININE 0.57* 0.66  ? ? ?Estimated Creatinine Clearance: 69.4 mL/min (by C-G formula based on SCr of 0.66 mg/dL). ? ? ?Medical History: ?Past Medical History:  ?Diagnosis Date  ? Arthritis   ? Cancer [facility])   ? Coronary atherosclerosis of native coronary artery   ? Depression   ? Esophageal cancer ([facility])   ? Essential tremor   ? High cholesterol   ? Hypertension   ? Hypertension   ? Hypogonadism male   ? Obstructive chronic bronchitis with exacerbation ([facility])   ? Obstructive sleep apnea (adult) (pediatric)   ? no cpap  ? Other and unspecified hyperlipidemia   ? Other emphysema ([facility])   ? Proteinuria 2019  ? has seen a nephrologist  ? Shortness of breath   ? with excertion  ? Situational stress   ? Type II or unspecified type diabetes mellitus without mention of complication, not stated as uncontrolled   ? type 2  ? Unspecified essential hypertension   ? ? ? ? ?Assessment: ?76 year old male presented to the ED from cancer center with possible anaphylactic reaction to  chemotherapy infusion. He required non-rebreather and vasopressor support. He is to be admitted to the ICU. Patient with history of atrial fibrillation on Eliquis PTA. Pharmacy consulted to manage heparin. Given patient acuity on presentation to ED, suspect last dose of Eliquis 3/8 morning prior to appointment at cancer center. ? ?Today, 11/20/2021 ?- CBC stable ?- Baseline labs pending ?- Follow aPTT given Eliquis PTA until correlation with heparin level, then dose per heparin levels ? ?Goal of Therapy:  ?Heparin level 0.3-0.7 units/ml ?aPTT 66-102 seconds ?Monitor platelets by anticoagulation protocol: Yes ?  ?Plan:  ?- Heparin 4200 unit bolus ?- Start heparin infusion at 1000 units/hr ?- Check aPTT 8 hours after start of infusion ?- CBC daily while on heparin infusion ? ?Tawnya Crook, PharmD, BCPS ?Clinical Pharmacist ?12/06/2021 6:46 PM ? ? ? ?

## 2021-11-22 NOTE — ED Notes (Signed)
EDP at the bedside.  ?

## 2021-11-22 NOTE — H&P (Signed)
? ?NAME:  Justin Aungst., MRN:  [id number], DOB:  1946/01/31, LOS: 0 ?ADMISSION DATE:  11/23/2021, CONSULTATION DATE:  11/28/2021 ?REFERRING MD: Andrey Campanile, MD, CHIEF COMPLAINT: Anaphylactic shock ? ?History of Present Illness:  ? ?76 year old with history of metastatic adenocarcinoma of distal esophagus, hypertension, COPD, OSA.  Diagnosed with esophageal cancer in June 2022 on treatment with FOLFOX and nivolumab.  Brought to the [facility] long ED from the cancer center after he received his infusions with abdominal pain, hypotension, hypoxia and dyspnea.  Felt to have an anaphylactic reaction from his infusion and given IV fluids, epinephrine injection, Pepcid and IV Solu-Medrol 125 mg.  Remains hypotensive and PCCM called for admission ? ? ?Pertinent  Medical History  ? ? has a past medical history of Arthritis, Cancer ([place]), Coronary atherosclerosis of native coronary artery, Depression, Esophageal cancer ([place]), Essential tremor, High cholesterol, Hypertension, Hypertension, Hypogonadism male, Obstructive chronic bronchitis with exacerbation ([place]), Obstructive sleep apnea (adult) (pediatric), Other and unspecified hyperlipidemia, Other emphysema ([place]), Proteinuria (2019), Shortness of breath, Situational stress, Type II or unspecified type diabetes mellitus without mention of complication, not stated as uncontrolled, and Unspecified essential hypertension.  ? ?Significant Hospital Events: ?Including procedures, antibiotic start and stop dates in addition to other pertinent events   ? ? ?Interim History / Subjective:  ?3/8-admit ? ?Objective   ?Blood pressure (!) 93/57, pulse (!) 126, resp. rate (!) 22, SpO2 100 %. ?   ?   ?No intake or output data in the 24 hours ending 12/13/2021 1713 ?There were no vitals filed for this visit. ? ?Examination: ?Gen:      No acute distress ?HEENT:  EOMI, sclera anicteric ?Neck:     No masses; no thyromegaly ?Lungs:    Clear to auscultation bilaterally; normal respiratory effort ?CV:          Regular rate and rhythm; no murmurs ?Abd:      + bowel sounds; soft, non-tender; no palpable masses, no distension ?Ext:    No edema; adequate peripheral perfusion ?Skin:      Warm and dry; no rash ?Neuro: alert and oriented x 3 ?Psych: normal mood and affect  ? ?Labs/imaging reviewed ?Significant for sodium 131, creatinine 0.66, hemoglobin 12.2, platelets 257 ?Chest x-ray with bilateral hilar and lower lobe opacities ? ?Resolved Hospital Problem list   ? ? ?Assessment & Plan:  ?Shock ?Presumed to be anaphylaxis though he has received FOLFOX and nivolumab many times before today ?We will admit to ICU, start pressors via Chemo-Port.  Obtain additional peripheral IV access ?Check cultures, broaden antibiotic coverage to treat empirically for sepsis ?Check lactic acid, procalcitonin ?CT chest abdomen pelvis ? ?Diabetes ?Holding outpatient medication.  SSI coverage ? ?Acute respiratory failure with hypoxia ?Reported history of COPD and OSA ?Continue bronchodilators, antibiotics as above ?Follow cultures ?Wean down on oxygen as tolerated ? ?Paroxysmal atrial fibrillation ?Continue heparin.  Hold Eliquis ?Amiodarone IV if needed for rate ? ?Best Practice (right click and "Reselect all SmartList Selections" daily)  ? ?Diet/type: NPO ?DVT prophylaxis: systemic heparin ?GI prophylaxis: PPI ?Lines: N/A ?Foley:  N/A ?Code Status:  full code ?Last date of multidisciplinary goals of care discussion.  11/20/2021.  Discussed goals of care with wife at bedside.  She wants full code for now with time-limited trial of intubation and support.  ? ?Critical care time:   ? ?The patient is critically ill with multiple organ system failure and requires high complexity decision making for assessment and support, frequent evaluation and titration of  therapies, advanced monitoring, review of radiographic studies and interpretation of complex data.  ? ?Critical Care Time devoted to patient care services, exclusive of separately billable  procedures, described in this note is 35 minutes.  ? ?Marshell Garfinkel MD ?[facility] ?See Amion for pager ? ?If no response to pager , please call [phone] until 7pm ?After 7:00 pm call Elink  [phone] ?12/11/2021, 5:14 PM  ? ? ?  ?

## 2021-11-22 NOTE — ED Notes (Signed)
Dr. Vaughan Browner notified pt's BP trending downward with systolic BP in the 32'D. Vasopressor requested by this nurse at this time. Per Dr. Vaughan Browner Levophed IV to be started.  ?

## 2021-11-22 NOTE — Progress Notes (Signed)
At about 1320 patient started c/o of mild abdominal discomfort, stated that he has not had a BM in several days so he thought perhaps he needed to have a BM. During the 5FU injection patient abdominal pain started to increase. He rated it at a 6, mid abdominal, epigastric pain, denied any chest pain, jaw pain, arm pain. MD made aware and assessed at the chairside . EKG ordered and performed and provided to the ED. Per VO 4-'81mg'$  ASA chewable administered, 2LO2 placed on pt, IVF initiated. Dr. Lorenso Courier called with bed in the ED. Patient transported to the ED via wheelchair covered with many blankets due to cold sensitivity r/t FOLFOX, on 4LO2, port accessed as well, and wife [place] present. Patient checked in the room 18 in the ED and report provided to Geni Bers. RN. Patient belongings then given to spouse in the ED lobby. Patient was not connected to the 5FU pump prior to ED transport. ?

## 2021-11-22 NOTE — Progress Notes (Signed)
Reviewed patient's chart. Currently has a PORT for vasopressor infusions. Fran Lowes, RN VAST ?

## 2021-11-23 ENCOUNTER — Inpatient Hospital Stay (HOSPITAL_COMMUNITY): Payer: PPO

## 2021-11-23 ENCOUNTER — Encounter (HOSPITAL_COMMUNITY): Payer: Self-pay | Admitting: Pulmonary Disease

## 2021-11-23 DIAGNOSIS — L8990 Pressure ulcer of unspecified site, unspecified stage: Secondary | ICD-10-CM | POA: Insufficient documentation

## 2021-11-23 DIAGNOSIS — E440 Moderate protein-calorie malnutrition: Secondary | ICD-10-CM | POA: Insufficient documentation

## 2021-11-23 DIAGNOSIS — R579 Shock, unspecified: Secondary | ICD-10-CM | POA: Diagnosis not present

## 2021-11-23 LAB — BASIC METABOLIC PANEL
Anion gap: 10 (ref 5–15)
Anion gap: 11 (ref 5–15)
BUN: 14 mg/dL (ref 8–23)
BUN: 25 mg/dL — ABNORMAL HIGH (ref 8–23)
CO2: 20 mmol/L — ABNORMAL LOW (ref 22–32)
CO2: 21 mmol/L — ABNORMAL LOW (ref 22–32)
Calcium: 7.9 mg/dL — ABNORMAL LOW (ref 8.9–10.3)
Calcium: 7 mg/dL — ABNORMAL LOW (ref 8.9–10.3)
Chloride: 99 mmol/L (ref 98–111)
Chloride: 96 mmol/L — ABNORMAL LOW (ref 98–111)
Creatinine, Ser: 0.87 mg/dL (ref 0.61–1.24)
Creatinine, Ser: 1.04 mg/dL (ref 0.61–1.24)
GFR, Estimated: >60 mL/min (ref >60)
GFR, Estimated: >60 mL/min (ref >60)
Glucose, Bld: 140 mg/dL — ABNORMAL HIGH (ref 70–99)
Glucose, Bld: 233 mg/dL — ABNORMAL HIGH (ref 70–99)
Potassium: 4.2 mmol/L (ref 3.5–5.1)
Potassium: 4.2 mmol/L (ref 3.5–5.1)
Sodium: 129 mmol/L — ABNORMAL LOW (ref 135–145)
Sodium: 128 mmol/L — ABNORMAL LOW (ref 135–145)

## 2021-11-23 LAB — BLOOD GAS, ARTERIAL
Acid-base deficit: 6.9 mmol/L — ABNORMAL HIGH (ref 0.0–2.0)
Acid-base deficit: 11.1 mmol/L — ABNORMAL HIGH (ref 0.0–2.0)
Acid-base deficit: 11 mmol/L — ABNORMAL HIGH (ref 0.0–2.0)
Acid-base deficit: 7.3 mmol/L — ABNORMAL HIGH (ref 0.0–2.0)
Bicarbonate: 17.7 mmol/L — ABNORMAL LOW (ref 20.0–28.0)
Bicarbonate: 20 mmol/L (ref 20.0–28.0)
Bicarbonate: 17.5 mmol/L — ABNORMAL LOW (ref 20.0–28.0)
Bicarbonate: 19.3 mmol/L — ABNORMAL LOW (ref 20.0–28.0)
Delivery systems: POSITIVE
Drawn by: 30860
Drawn by: 25788
Drawn by: 25788
Drawn by: 42261
Expiratory PAP: 5 cmH2O
FIO2: 70 %
FIO2: 100 %
FIO2: 100 %
FIO2: 100 %
Inspiratory PAP: 10 cmH2O
MECHVT: 380 mL
MECHVT: 380 mL
MECHVT: 380 mL
O2 Saturation: 94.2 %
O2 Saturation: 92.4 %
O2 Saturation: 80.5 %
O2 Saturation: 94.8 %
PEEP: 12 cmH2O
PEEP: 12 cmH2O
PEEP: 12 cmH2O
Patient temperature: 37.1
Patient temperature: 36.5
Patient temperature: 36.2
Patient temperature: 37.3
RATE: 18 resp/min
RATE: 28 resp/min
RATE: 28 resp/min
pCO2 arterial: 32 mmHg (ref 32–48)
pCO2 arterial: 68 mmHg (ref 32–48)
pCO2 arterial: 46 mmHg (ref 32–48)
pCO2 arterial: 43 mmHg (ref 32–48)
pH, Arterial: 7.35 (ref 7.35–7.45)
pH, Arterial: 7.08 — CL (ref 7.35–7.45)
pH, Arterial: 7.18 — CL (ref 7.35–7.45)
pH, Arterial: 7.27 — ABNORMAL LOW (ref 7.35–7.45)
pO2, Arterial: 69 mmHg — ABNORMAL LOW (ref 83–108)
pO2, Arterial: 74 mmHg — ABNORMAL LOW (ref 83–108)
pO2, Arterial: 53 mmHg — ABNORMAL LOW (ref 83–108)
pO2, Arterial: 75 mmHg — ABNORMAL LOW (ref 83–108)

## 2021-11-23 LAB — CBC
HCT: 37.3 % — ABNORMAL LOW (ref 39.0–52.0)
Hemoglobin: 11.8 g/dL — ABNORMAL LOW (ref 13.0–17.0)
MCH: 32.4 pg (ref 26.0–34.0)
MCHC: 31.6 g/dL (ref 30.0–36.0)
MCV: 102.5 fL — ABNORMAL HIGH (ref 80.0–100.0)
Platelets: 237 10*3/uL (ref 150–400)
RBC: 3.64 MIL/uL — ABNORMAL LOW (ref 4.22–5.81)
RDW: 15.9 % — ABNORMAL HIGH (ref 11.5–15.5)
WBC: 28.4 10*3/uL — ABNORMAL HIGH (ref 4.0–10.5)
nRBC: 0.1 % (ref 0.0–0.2)

## 2021-11-23 LAB — RESPIRATORY PANEL BY PCR

## 2021-11-23 LAB — COOXEMETRY PANEL
Carboxyhemoglobin: 1.6 % — ABNORMAL HIGH (ref 0.5–1.5)
Methemoglobin: <0.7 % (ref 0.0–1.5)
O2 Saturation: 70.9 %
Total hemoglobin: 12.4 g/dL (ref 12.0–16.0)

## 2021-11-23 LAB — BODY FLUID CELL COUNT WITH DIFFERENTIAL
Lymphs, Fluid: 3 %
Lymphs, Fluid: 4 %
Monocyte-Macrophage-Serous Fluid: 6 % — ABNORMAL LOW (ref 50–90)
Monocyte-Macrophage-Serous Fluid: 9 % — ABNORMAL LOW (ref 50–90)
Neutrophil Count, Fluid: 88 % — ABNORMAL HIGH (ref 0–25)
Neutrophil Count, Fluid: 90 % — ABNORMAL HIGH (ref 0–25)
Total Nucleated Cell Count, Fluid: 587 cu mm (ref 0–1000)
Total Nucleated Cell Count, Fluid: 770 cu mm (ref 0–1000)

## 2021-11-23 LAB — MAGNESIUM
Magnesium: 1.9 mg/dL (ref 1.7–2.4)
Magnesium: 2.5 mg/dL — ABNORMAL HIGH (ref 1.7–2.4)
Magnesium: 2.4 mg/dL (ref 1.7–2.4)

## 2021-11-23 LAB — GLUCOSE, CAPILLARY
Glucose-Capillary: 167 mg/dL — ABNORMAL HIGH (ref 70–99)
Glucose-Capillary: 134 mg/dL — ABNORMAL HIGH (ref 70–99)
Glucose-Capillary: 169 mg/dL — ABNORMAL HIGH (ref 70–99)
Glucose-Capillary: 199 mg/dL — ABNORMAL HIGH (ref 70–99)
Glucose-Capillary: 243 mg/dL — ABNORMAL HIGH (ref 70–99)
Glucose-Capillary: 207 mg/dL — ABNORMAL HIGH (ref 70–99)

## 2021-11-23 LAB — URINALYSIS, ROUTINE W REFLEX MICROSCOPIC
Bilirubin Urine: NEGATIVE
Glucose, UA: NEGATIVE mg/dL
Ketones, ur: NEGATIVE mg/dL
Leukocytes,Ua: NEGATIVE
Nitrite: NEGATIVE
Protein, ur: NEGATIVE mg/dL
Specific Gravity, Urine: 1.008 (ref 1.005–1.030)
pH: 5 (ref 5.0–8.0)

## 2021-11-23 LAB — PHOSPHORUS
Phosphorus: 5.6 mg/dL — ABNORMAL HIGH (ref 2.5–4.6)
Phosphorus: 5.8 mg/dL — ABNORMAL HIGH (ref 2.5–4.6)

## 2021-11-23 LAB — APTT
aPTT: 86 seconds — ABNORMAL HIGH (ref 24–36)
aPTT: 152 seconds — ABNORMAL HIGH (ref 24–36)

## 2021-11-23 LAB — HEPARIN LEVEL (UNFRACTIONATED): Heparin Unfractionated: 1.04 IU/mL — ABNORMAL HIGH (ref 0.30–0.70)

## 2021-11-23 LAB — LACTIC ACID, PLASMA: Lactic Acid, Venous: 4.2 mmol/L (ref 0.5–1.9)

## 2021-11-23 LAB — CORTISOL: Cortisol, Plasma: 49.8 ug/dL

## 2021-11-23 MED ORDER — HEPARIN (PORCINE) 25000 UT/250ML-% IV SOLN
800.0000 [IU]/h | INTRAVENOUS | Status: DC
  Administered 2021-11-23 (×2): 800 [IU]/h via INTRAVENOUS
  Filled 2021-11-23: qty 250

## 2021-11-23 MED ORDER — ETOMIDATE 2 MG/ML IV SOLN
INTRAVENOUS | Status: AC
  Administered 2021-11-23: 18:00:00 20 mg via INTRAVENOUS
  Filled 2021-11-23: qty 10

## 2021-11-23 MED ORDER — ALBUTEROL SULFATE (2.5 MG/3ML) 0.083% IN NEBU
2.5000 mg | INHALATION_SOLUTION | RESPIRATORY_TRACT | Status: DC | PRN
Start: 2021-11-23 — End: 2021-11-29
  Administered 2021-11-23: 03:00:00 2.5 mg via RESPIRATORY_TRACT
  Filled 2021-11-23 (×2): qty 3

## 2021-11-23 MED ORDER — SODIUM CHLORIDE 0.9 % IV BOLUS
500.0000 mL | Freq: Once | INTRAVENOUS | Status: AC
  Administered 2021-11-23: 10:00:00 500 mL via INTRAVENOUS

## 2021-11-23 MED ORDER — PROPOFOL 1000 MG/100ML IV EMUL
0.0000 ug/kg/min | INTRAVENOUS | Status: DC
  Administered 2021-11-23: 20:00:00 5 ug/kg/min via INTRAVENOUS
  Filled 2021-11-23 (×2): qty 100

## 2021-11-23 MED ORDER — ETOMIDATE 2 MG/ML IV SOLN: 20.0000 mg | Freq: Once | INTRAVENOUS | Status: AC

## 2021-11-23 MED ORDER — ROCURONIUM BROMIDE 50 MG/5ML IV SOLN
80.0000 mg | Freq: Once | INTRAVENOUS | Status: AC
  Filled 2021-11-23: qty 8

## 2021-11-23 MED ORDER — MIDAZOLAM HCL 2 MG/2ML IJ SOLN
INTRAMUSCULAR | Status: AC
  Administered 2021-11-23: 09:00:00 2 mg via INTRAVENOUS
  Filled 2021-11-23: qty 2

## 2021-11-23 MED ORDER — FENTANYL BOLUS VIA INFUSION
25.0000 ug | INTRAVENOUS | Status: DC | PRN
  Administered 2021-11-23: 16:00:00 50 ug via INTRAVENOUS
  Administered 2021-11-23: 16:00:00 100 ug via INTRAVENOUS
  Administered 2021-11-23: 16:00:00 75 ug via INTRAVENOUS
  Filled 2021-11-23: qty 100

## 2021-11-23 MED ORDER — OSMOLITE 1.5 CAL PO LIQD
1000.0000 mL | ORAL | Status: DC
  Administered 2021-11-23 – 2021-11-24 (×2): 1000 mL
  Filled 2021-11-23 (×3): qty 1000

## 2021-11-23 MED ORDER — FUROSEMIDE 10 MG/ML IJ SOLN
40.0000 mg | Freq: Once | INTRAMUSCULAR | Status: AC
  Administered 2021-11-23: 08:00:00 40 mg via INTRAVENOUS
  Filled 2021-11-23: qty 4

## 2021-11-23 MED ORDER — FENTANYL CITRATE (PF) 100 MCG/2ML IJ SOLN
INTRAMUSCULAR | Status: AC
  Administered 2021-11-23: 09:00:00 50 ug via INTRAVENOUS
  Filled 2021-11-23: qty 2

## 2021-11-23 MED ORDER — DEXMEDETOMIDINE HCL IN NACL 400 MCG/100ML IV SOLN
0.0000 ug/kg/h | INTRAVENOUS | Status: AC
  Administered 2021-11-23: 13:00:00 0.4 ug/kg/h via INTRAVENOUS
  Filled 2021-11-23: qty 100

## 2021-11-23 MED ORDER — FAMOTIDINE IN NACL 20-0.9 MG/50ML-% IV SOLN
20.0000 mg | Freq: Once | INTRAVENOUS | Status: AC
  Administered 2021-11-23: 11:00:00 20 mg via INTRAVENOUS
  Filled 2021-11-23: qty 50

## 2021-11-23 MED ORDER — PANTOPRAZOLE SODIUM 40 MG IV SOLR
40.0000 mg | Freq: Every day | INTRAVENOUS | Status: DC
  Administered 2021-11-23 – 2021-11-26 (×4): 40 mg via INTRAVENOUS
  Filled 2021-11-23 (×5): qty 10

## 2021-11-23 MED ORDER — VASOPRESSIN 20 UNITS/100 ML INFUSION FOR SHOCK
INTRAVENOUS | Status: AC
  Administered 2021-11-23: 10:00:00 0.03 [IU]/min via INTRAVENOUS
  Filled 2021-11-23: qty 100

## 2021-11-23 MED ORDER — FENTANYL CITRATE (PF) 100 MCG/2ML IJ SOLN: 50.0000 ug | Freq: Once | INTRAMUSCULAR | Status: AC

## 2021-11-23 MED ORDER — MIDAZOLAM HCL 2 MG/2ML IJ SOLN: 2.0000 mg | Freq: Once | INTRAMUSCULAR | Status: AC

## 2021-11-23 MED ORDER — VASOPRESSIN 20 UNITS/100 ML INFUSION FOR SHOCK
0.0000 [IU]/min | INTRAVENOUS | Status: DC
  Administered 2021-11-24: 0.03 [IU]/min via INTRAVENOUS
  Filled 2021-11-23 (×2): qty 100

## 2021-11-23 MED ORDER — FENTANYL 2500MCG IN NS 250ML (10MCG/ML) PREMIX INFUSION
25.0000 ug/h | INTRAVENOUS | Status: DC
  Administered 2021-11-23: 16:00:00 25 ug/h via INTRAVENOUS
  Administered 2021-11-24: 150 ug/h via INTRAVENOUS
  Filled 2021-11-23 (×2): qty 250

## 2021-11-23 MED ORDER — MAGNESIUM SULFATE 2 GM/50ML IV SOLN
2.0000 g | Freq: Once | INTRAVENOUS | Status: AC
  Administered 2021-11-23: 04:00:00 2 g via INTRAVENOUS
  Filled 2021-11-23: qty 50

## 2021-11-23 MED ORDER — ORAL CARE MOUTH RINSE
15.0000 mL | OROMUCOSAL | Status: DC
  Administered 2021-11-23 – 2021-11-29 (×58): 15 mL via OROMUCOSAL

## 2021-11-23 MED ORDER — DIPHENHYDRAMINE HCL 50 MG/ML IJ SOLN
25.0000 mg | Freq: Once | INTRAMUSCULAR | Status: AC
  Administered 2021-11-23: 02:00:00 25 mg via INTRAVENOUS
  Filled 2021-11-23: qty 1

## 2021-11-23 MED ORDER — NOREPINEPHRINE 16 MG/250ML-% IV SOLN
0.0000 ug/min | INTRAVENOUS | Status: DC
  Administered 2021-11-23: 20:00:00 2 ug/min via INTRAVENOUS
  Administered 2021-11-24: 15 ug/min via INTRAVENOUS
  Administered 2021-11-25: 2 ug/min via INTRAVENOUS
  Filled 2021-11-23 (×2): qty 250

## 2021-11-23 MED ORDER — VITAL HIGH PROTEIN PO LIQD: 1000.0000 mL | ORAL | Status: DC

## 2021-11-23 MED ORDER — VANCOMYCIN HCL 1250 MG/250ML IV SOLN
1250.0000 mg | INTRAVENOUS | Status: DC
  Administered 2021-11-23 – 2021-11-24 (×2): 1250 mg via INTRAVENOUS
  Filled 2021-11-23 (×2): qty 250

## 2021-11-23 MED ORDER — POLYETHYLENE GLYCOL 3350 17 G PO PACK
17.0000 g | PACK | Freq: Every day | ORAL | Status: DC
  Administered 2021-11-24 – 2021-11-27 (×4): 17 g
  Filled 2021-11-23 (×6): qty 1

## 2021-11-23 MED ORDER — SODIUM BICARBONATE 8.4 % IV SOLN
INTRAVENOUS | Status: DC
  Filled 2021-11-23: qty 150
  Filled 2021-11-23: qty 1000

## 2021-11-23 MED ORDER — FUROSEMIDE 10 MG/ML IJ SOLN
20.0000 mg | Freq: Once | INTRAMUSCULAR | Status: AC
  Administered 2021-11-23: 04:00:00 20 mg via INTRAVENOUS
  Filled 2021-11-23: qty 2

## 2021-11-23 MED ORDER — DOCUSATE SODIUM 50 MG/5ML PO LIQD
100.0000 mg | Freq: Two times a day (BID) | ORAL | Status: DC
  Administered 2021-11-24 – 2021-11-27 (×6): 100 mg
  Filled 2021-11-23 (×8): qty 10

## 2021-11-23 MED ORDER — ETOMIDATE 2 MG/ML IV SOLN
INTRAVENOUS | Status: AC
  Administered 2021-11-23: 10:00:00 20 mg via INTRAVENOUS
  Filled 2021-11-23: qty 20

## 2021-11-23 MED ORDER — FENTANYL CITRATE (PF) 100 MCG/2ML IJ SOLN
25.0000 ug | INTRAMUSCULAR | Status: DC | PRN
  Administered 2021-11-23: 15:00:00 75 ug via INTRAVENOUS
  Administered 2021-11-23: 15:00:00 25 ug via INTRAVENOUS
  Filled 2021-11-23 (×2): qty 2

## 2021-11-23 MED ORDER — SODIUM CHLORIDE 0.9 % IV SOLN
2.0000 g | Freq: Two times a day (BID) | INTRAVENOUS | Status: DC
  Administered 2021-11-23 – 2021-11-24 (×3): 2 g via INTRAVENOUS
  Filled 2021-11-23 (×4): qty 2

## 2021-11-23 MED ORDER — PROSOURCE TF PO LIQD
45.0000 mL | Freq: Two times a day (BID) | ORAL | Status: DC
  Filled 2021-11-23: qty 45

## 2021-11-23 MED ORDER — SODIUM BICARBONATE 8.4 % IV SOLN
100.0000 meq | Freq: Once | INTRAVENOUS | Status: AC
  Administered 2021-11-23: 18:00:00 100 meq via INTRAVENOUS
  Filled 2021-11-23: qty 100

## 2021-11-23 MED ORDER — VANCOMYCIN HCL 1500 MG/300ML IV SOLN
1500.0000 mg | INTRAVENOUS | Status: DC
  Filled 2021-11-23: qty 300

## 2021-11-23 MED ORDER — MIDAZOLAM HCL 2 MG/2ML IJ SOLN
INTRAMUSCULAR | Status: AC
  Administered 2021-11-23: 17:00:00 2 mg via INTRAVENOUS
  Filled 2021-11-23: qty 2

## 2021-11-23 MED ORDER — ROCURONIUM BROMIDE 10 MG/ML (PF) SYRINGE
PREFILLED_SYRINGE | INTRAVENOUS | Status: AC
Start: 2021-11-23 — End: 2021-11-23
  Administered 2021-11-23: 09:00:00 80 mg via INTRAVENOUS
  Filled 2021-11-23: qty 10

## 2021-11-23 NOTE — Progress Notes (Addendum)
Initial Nutrition Assessment ? ?DOCUMENTATION CODES:  ? ?Non-severe (moderate) malnutrition in context of chronic illness ? ?INTERVENTION:  ?- will order Osmolite 1.5 @ 25 ml/hr to advance by 10 ml every 8 hours to reach goal rate of 55 ml/hr. ?- at goal rate, this regimen will provide 1980 kcal, 89 grams protein, and 1008 ml free water. ? ? ?NUTRITION DIAGNOSIS:  ? ?Moderate Malnutrition related to chronic illness, cancer and cancer related treatments as evidenced by mild fat depletion, moderate muscle depletion. ? ?GOAL:  ? ?Patient will meet greater than or equal to 90% of their needs ? ?MONITOR:  ? ?Vent status, TF tolerance, Labs, Weight trends, Skin ? ?REASON FOR ASSESSMENT:  ? ?Ventilator, Consult ?Enteral/tube feeding initiation and management ? ?ASSESSMENT:  ? ?76 year old with history of metastatic adenocarcinoma of distal esophagus on chemo (FOLFOX and nivolumab, HTN, COPD, OSA, CAD, arthritis, type 2 DM, depression, essential tremor. He presented to the ED from the [facility] after receiving infusion due to abdominal pain, hypotension, hypoxia, and dyspnea. Concern for anaphylactic reaction to infusion. ? ?Patient discussed in rounds this AM. Patient intubated late morning. Pending OGT/NGT placement.  ? ?Patient's wife and daughter were at bedside and able to provide information from PTA. Patient consistently eats breakfast (breakfast bar and Boost) and dinner (variable, usually a meat and at least a vegetable or starch), and more recently he has begun eating a small lunch meal (such as a sandwich). Sometimes he drinks a second Boost. ? ?Patient has not had any issues with taste alteration. The only side effect he experienced was cold sensitivity so he has not been consuming cold beverages or reaching into the refrigerator.  ? ?He has been increasingly fatigued and weak at home. He had been using a walker but more recently also requires the use of a wheelchair; he alternates between the two depending  on how he is feeling.  ? ?Weight today is 156 lb and weight on 1/25 was 167 lb. This indicates 11 lb weight loss (6.5% body weight) in the past 1.5 months. Of note, patient with severe pitting edema to BLE present and impacting current weight.  ?Patient is currently intubated on ventilator support ? ?MV: 9.1 L/min ?Temp (24hrs), Avg:99.7 ?F (37.6 ?C), Min:97.4 ?F (36.3 ?C), Max:102.4 ?F (39.1 ?C) ?Propofol: none ?BP: 83/61 and MAP: 68 ? ? ?Labs reviewed; CBGs: 167, 134, 169 mg/dl, Na: 129 mmol/l, Ca: 7.9 mg/dl. ? ?Medications reviewed; 20 mg IV lasix x1 dose 3/9, 40 mg IV lasix x1 dose 3/9, sliding scale novolog, 2 g IV Mg sulfate x1 dose 3/8 and x1 dose 3/9, 125 mg solu-medrol x1 dose 3/8, 40 mg solu-medrol BID, 40 mg IV protonix/day, 17 g miralax/day, 10 mEq IV KCl x4 runs 3/8. ? ?Drip; levo @ 13 mcg/min. ? ?  ? ?NUTRITION - FOCUSED PHYSICAL EXAM: ? ?Flowsheet Row Most Recent Value  ?Orbital Region Mild depletion  ?Upper Arm Region Mild depletion  ?Thoracic and Lumbar Region Unable to assess  ?Buccal Region Unable to assess  [ETT holder]  ?Temple Region Unable to assess  [ETT holder]  ?Clavicle Bone Region Moderate depletion  ?Clavicle and Acromion Bone Region Mild depletion  ?Scapular Bone Region Moderate depletion  ?Dorsal Hand Unable to assess  [mittens]  ?Patellar Region No depletion  ?Anterior Thigh Region No depletion  ?Posterior Calf Region No depletion  ?Edema (RD Assessment) Severe  [BLE to up mid-calf]  ?Hair Reviewed  ?Eyes Unable to assess  [kept closed]  ?Mouth Unable to assess  ?  Skin Reviewed  ?Nails Unable to assess  ? ?  ? ? ?Diet Order:   ?Diet Order   ? ?       ?  Diet NPO time specified  Diet effective now       ?  ? ?  ?  ? ?  ? ? ?EDUCATION NEEDS:  ? ?No education needs have been identified at this time ? ?Skin:  Skin Assessment: Skin Integrity Issues: ?Skin Integrity Issues:: Stage I, Other (Comment) ?Stage I: sacrum ?Other: MASD bilateral groin ? ?Last BM:  3/9 (type 6 x2, small amount and  medium amount) ? ?Height:  ? ?Ht Readings from Last 1 Encounters:  ?12/04/2021 '5\' 6"'$  (1.676 m)  ? ? ?Weight:  ? ?Wt Readings from Last 1 Encounters:  ?11/23/21 71 kg  ? ? ? ?BMI:  Body mass index is 25.26 kg/m?. ? ?Estimated Nutritional Needs:  ?Kcal:  1932 kcal ?Protein:  85-106 grams ?Fluid:  >/= 1.5 L/day ? ? ? ? ?Jarome Matin, MS, RD, LDN ?Inpatient Clinical Dietitian ?RD pager # available in [place]  ?After hours/weekend pager # available in [place] ? ?

## 2021-11-23 NOTE — Consult Note (Addendum)
[place]  [phone] Fax:([phone]) [place]  Reason for Consult: Metastatic adenocarcinoma of the distal esophagus  Hematological/Oncological History # Metastatic Adenocarcinoma of the Distal Esophagus 01/27/2021: dermatology performed a punch biopsy of the right superior lateral anterior neck. Finding show adenocarcinoma negative for PSA and cytokeratin 20.  02/06/2021: establish care with Dr. Lorenso Courier  02/15/2021: EGD found esophageal mass. Biopsy confirms poorly differentiated carcinoma 02/22/2021: PET CT scan shows FDG avid mass in distal esophagus and subcutaneous tissue or right neck.  03/22/2021-04/05/2021: palliative radiation to the esophageal mass.  04/26/2021: Cycle 1 Day 1 of FOLFOX + Nivolumab 05/10/2021: Cycle 2 Day 1 of FOLFOX + Nivolumab 05/24/2021: Cycle 3 Day 1 of FOLFOX + Nivolumab 06/07/2021: Cycle 4 Day 1 of FOLFOX + Nivolumab 06/23/2021: Cycle 5 Day 1 of FOLFOX + Nivolumab 07/05/2021: Cycle 6 Day 1 of FOLFOX + Nivolumab 08/15/2021: Cycle 7 Day 1 of FOLFOX + Nivolumab (delayed due to hospitalization for N/V and dehydration) 08/29/2021: Cycle 8 Day 1 of FOLFOX + Nivolumab  09/12/2021: Cycle 9 Day 1 of FOLFOX + Nivolumab  09/26/2021: cycle delayed due to COVID infection.  10/11/2021: Cycle 10 Day 1 of FOLFOX + Nivolumab  10/20/2021: CT C/A/P showed residual distal esophageal wall thickening. No definitive evidence metastatic disease 10/25/2021: Cycle 11 Day 1 of FOLFOX + Nivolumab  11/07/2021: HOLD Cycle 12 due to recent fall, neutropenia/anemia 11/15/2021: Cycle 12 Day 1 of FOLFOX + Nivolumab  HPI: Mr. Tugwell is a 76 year old male with a past medical history significant for metastatic adenocarcinoma of the distal esophagus, hypertension, COPD, OSA.  He was seen at the cancer center yesterday and proceeded with cycle 12 of his chemotherapy consisting of FOLFOX with nivolumab.  During his infusion, he complained of mild abdominal  discomfort.  Abdominal pain worsened and he developed epigastric pain.  He was given aspirin and placed on O2.  He was transported to the emergency department for further evaluation.  It appears that the patient received Aloxi, nivolumab, oxaliplatin, and 5-FU in our office yesterday.  The 5-FU was infusing at the time his symptoms started.  Symptoms were thought to be related to an anaphylactic reaction to his chemotherapy.  He received IV fluids, IV Pepcid, IV Solu-Medrol 125 mg, and epinephrine in our office prior to being transported to the emergency department.  Admitted by PCCM and started on pressors.  He was cultured and antibiotics were started as well.  Chest x-ray showed stable cardiomegaly, bilateral perihilar and lower lobe hazy opacities which could represent pulmonary edema versus infiltrate.  CT chest/abdomen/pelvis was performed without contrast which showed findings consistent with CHF or fluid overload.  The patient has continued to decompensate overnight and was intubated just prior to my visit.  No family at the bedside.  Unable to obtain review of systems.  Oncology was asked see the patient make recommendations regarding his metastatic esophageal cancer.  Past Medical History:  Diagnosis Date   Arthritis    Cancer [facility])    Coronary atherosclerosis of native coronary artery    Depression    Esophageal cancer (HCC)    Essential tremor    High cholesterol    Hypertension    Hypogonadism male    Obstructive chronic bronchitis with exacerbation (HCC)    Obstructive sleep apnea (adult) (pediatric)    no cpap   Other and unspecified hyperlipidemia    Other emphysema ([place])    Proteinuria 2019   has seen a nephrologist  Situational stress    Type II or unspecified type diabetes mellitus without mention of complication, not stated as uncontrolled    type 2  :   Past Surgical History:  Procedure Laterality Date   CARDIAC CATHETERIZATION     IR IMAGING GUIDED PORT INSERTION   04/24/2021   IR KYPHO LUMBAR INC FX REDUCE BONE BX UNI/BIL CANNULATION INC/IMAGING  06/13/2018   L lens implant     R knee replacement x2     REVERSE SHOULDER ARTHROPLASTY Left 03/26/2019   Procedure: REVERSE SHOULDER ARTHROPLASTY;  Surgeon: Justice Britain, MD;  Location: [facility];  Service: Orthopedics;  Laterality: Left;  185mn   TONSILLECTOMY     TONSILLECTOMY AND ADENOIDECTOMY     vascectomy    :   Current Facility-Administered Medications  Medication Dose Route Frequency Provider Last Rate Last Admin   0.9 %  sodium chloride infusion   Intravenous PRN Mannam, Praveen, MD   Stopped at 11/27/2021 2022   acetaminophen (TYLENOL) tablet 650 mg  650 mg Oral Q6H PRN RNevada CraneM, PA-C       Or   acetaminophen (TYLENOL) suppository 650 mg  650 mg Rectal Q6H PRN RLestine Mount PA-C   650 mg at 11/23/21 0819   albuterol (PROVENTIL) (2.5 MG/3ML) 0.083% nebulizer solution 2.5 mg  2.5 mg Nebulization Q3H PRN SAnders Simmonds MD   2.5 mg at 11/23/21 0244   ceFEPIme (MAXIPIME) 2 g in sodium chloride 0.9 % 100 mL IVPB  2 g Intravenous Q8H Wofford, Drew A, RPH 200 mL/hr at 11/23/21 1300 Infusion Verify at 11/23/21 1300   dexmedetomidine (PRECEDEX) 400 MCG/100ML (4 mcg/mL) infusion  0-1.2 mcg/kg/hr Intravenous Continuous Ollis, Brandi L, NP 7.1 mL/hr at 11/23/21 1300 0.4 mcg/kg/hr at 11/23/21 1300   docusate (COLACE) 50 MG/5ML liquid 100 mg  100 mg Per Tube BID ONoe GensL, NP       docusate sodium (COLACE) capsule 100 mg  100 mg Oral BID PRN RNevada CraneM, PA-C       feeding supplement (OSMOLITE 1.5 CAL) liquid 1,000 mL  1,000 mL Per Tube Continuous Mannam, Praveen, MD       fentaNYL (SUBLIMAZE) injection 25-100 mcg  25-100 mcg Intravenous Q30 min PRN Ollis, Brandi L, NP       heparin ADULT infusion 100 units/mL (25000 units/2566m  800 Units/hr Intravenous Continuous Shade, Christine E, RPH 8 mL/hr at 11/23/21 1300 800 Units/hr at 11/23/21 1300   insulin aspart (novoLOG) injection 0-15  Units  0-15 Units Subcutaneous Q4H Mannam, Praveen, MD   3 Units at 11/23/21 1240   MEDLINE mouth rinse  15 mL Mouth Rinse 10 times per day OlNoe Gens, NP   15 mL at 11/23/21 1251   methylPREDNISolone sodium succinate (SOLU-MEDROL) 40 mg/mL injection 40 mg  40 mg Intravenous Q12H ReNevada Crane, PA-C   40 mg at 11/23/21 1045   norepinephrine (LEVOPHED) 29m75mn 250m31m.016 mg/mL) premix infusion  0-40 mcg/min Intravenous Titrated SommAnders Simmonds 52.5 mL/hr at 11/23/21 1314 14 mcg/min at 11/23/21 1314   pantoprazole (PROTONIX) injection 40 mg  40 mg Intravenous Daily Ollis, Brandi L, NP   40 mg at 11/23/21 1241   polyethylene glycol (MIRALAX / GLYCOLAX) packet 17 g  17 g Oral Daily PRN ReesNevada CranePA-C       polyethylene glycol (MIRALAX / GLYCOLAX) packet 17 g  17 g Per Tube Daily OlliDonita Brooks  vancomycin (VANCOREADY) IVPB 1500 mg/300 mL  1,500 mg Intravenous Q24H Poindexter, Leann T, RPH       vasopressin (PITRESSIN) 20 Units in sodium chloride 0.9 % 100 mL infusion-*FOR SHOCK*  0-0.03 Units/min Intravenous Continuous Mannam, Praveen, MD   Stopped at 11/23/21 1004      Allergies  Allergen Reactions   Iodinated Contrast Media Anaphylaxis   Moxifloxacin Swelling    REACTION: GI upset, throat "tightened up"   Penicillins Anaphylaxis    Remote anaphylaxis reaction (not childhood) requiring MD/hospital care - tolerates Ceftin, Rocephin, Cefepime    Shellfish-Derived Products Anaphylaxis   Amoxicillin Swelling    Throat tightened up Tolerates Ceftin, Rocephin, Cefepime   Chlorhexidine Other (See Comments)    Wife is not aware of this allergy   Latex Rash    Reaction to prolonged exposure  :   Family History  Problem Relation Age of Onset   Emphysema Father    Heart disease Father    Clotting disorder Mother    Kidney cancer Child   :   Social History   Socioeconomic History   Marital status: Married    Spouse name: Not on file   Number of  children: Not on file   Years of education: Not on file   Highest education level: Not on file  Occupational History   Occupation: Landscape architect  Tobacco Use   Smoking status: Every Day    Packs/day: 1.00    Years: 55.00    Pack years: 55.00    Types: Cigarettes   Smokeless tobacco: Never  Vaping Use   Vaping Use: Never used  Substance and Sexual Activity   Alcohol use: Yes    Alcohol/week: 1.0 standard drink    Types: 1 Cans of beer per week    Comment: occasional beer   Drug use: Never   Sexual activity: Not Currently  Other Topics Concern   Not on file  Social History Narrative   ** Merged History Encounter **       Social Determinants of Health   Financial Resource Strain: Not on file  Food Insecurity: Not on file  Transportation Needs: Not on file  Physical Activity: Not on file  Stress: Not on file  Social Connections: Not on file  Intimate Partner Violence: Not on file  :  Review of Systems: Unable to obtain.  Exam: Patient Vitals for the past 24 hrs:  BP Temp Temp src Pulse Resp SpO2 Height Weight  11/23/21 1330 (!) 86/67 -- -- -- -- -- -- --  11/23/21 1315 (!) 72/46 -- -- -- -- -- -- --  11/23/21 1300 (!) 78/55 -- -- (!) 107 (!) 30 90 % -- --  11/23/21 1245 (!) 88/61 -- -- -- -- -- -- --  11/23/21 1230 (!) 83/61 -- -- -- -- -- -- --  11/23/21 1215 (!) 95/59 -- -- -- -- -- -- --  11/23/21 1200 (!) 77/36 (!) 97.4 F (36.3 C) Axillary (!) 107 20 (!) 89 % -- --  11/23/21 1155 (!) 83/57 -- -- (!) 103 18 91 % -- --  11/23/21 1130 (!) 88/48 -- -- -- -- -- -- --  11/23/21 1115 (!) 88/54 -- -- -- -- -- -- --  11/23/21 1100 (!) 83/52 -- -- (!) 124 18 90 % -- --  11/23/21 1045 (!) 90/43 -- -- -- -- -- -- --  11/23/21 1030 (!) 103/55 -- -- -- -- -- -- --  11/23/21 1015 130/67 -- -- -- -- -- -- --  11/23/21 1009 137/71 -- -- -- -- -- -- --  11/23/21 1006 (!) 150/72 -- -- -- -- 94 % -- --  11/23/21 1003 (!) 166/81 -- -- -- -- -- -- --   11/23/21 1000 (!) 166/77 -- -- (!) 127 18 90 % -- --  11/23/21 0957 (!) 150/76 -- -- -- -- -- -- --  11/23/21 0954 (!) 158/68 -- -- -- -- -- -- --  11/23/21 0951 135/66 -- -- -- -- -- -- --  11/23/21 0945 110/81 -- -- (!) 115 18 (!) 89 % -- --  11/23/21 0942 (!) 92/51 -- -- -- -- -- -- --  11/23/21 0939 (!) 54/34 -- -- -- -- -- -- --  11/23/21 0936 (!) 51/24 -- -- -- -- -- -- --  11/23/21 0930 (!) 85/58 -- -- (!) 138 (!) 32 92 % -- --  11/23/21 0800 107/62 (!) 102 F (38.9 C) Axillary (!) 118 (!) 25 (!) 87 % -- --  11/23/21 0759 107/62 -- -- (!) 119 -- 93 % -- --  11/23/21 0743 -- -- -- (!) 120 (!) 25 (!) 85 % -- --  11/23/21 0730 (!) 81/52 -- -- -- -- -- -- --  11/23/21 0710 118/81 -- -- -- -- -- -- --  11/23/21 0700 -- -- -- -- -- 93 % -- --  11/23/21 0655 103/76 -- -- (!) 119 (!) 35 (!) 84 % -- --  11/23/21 0647 -- -- -- (!) 112 (!) 30 (!) 89 % -- --  11/23/21 0640 112/67 -- -- (!) 111 (!) 44 91 % -- --  11/23/21 0630 119/61 -- -- (!) 129 (!) 43 (!) 88 % -- --  11/23/21 0615 102/77 -- -- (!) 120 (!) 48 90 % -- --  11/23/21 0610 (!) 64/22 -- -- (!) 129 -- (!) 87 % -- --  11/23/21 0600 130/85 -- -- (!) 129 -- (!) 81 % -- --  11/23/21 0555 132/65 -- -- (!) 123 -- 91 % -- --  11/23/21 0550 (!) 166/105 -- -- (!) 114 -- (!) 76 % -- --  11/23/21 0540 (!) 142/32 -- -- (!) 122 -- 93 % -- --  11/23/21 0530 118/72 -- -- (!) 121 -- (!) 84 % -- --  11/23/21 0520 (!) 98/58 -- -- (!) 119 (!) 25 91 % -- --  11/23/21 0510 (!) 105/37 -- -- (!) 117 (!) 34 (!) 89 % -- --  11/23/21 0500 -- -- -- -- -- -- -- 156 lb 8.4 oz (71 kg)  11/23/21 0450 110/77 -- -- (!) 118 (!) 36 91 % -- --  11/23/21 0445 (!) 148/126 -- -- (!) 118 (!) 31 96 % -- --  11/23/21 0430 129/60 -- -- (!) 121 (!) 36 96 % -- --  11/23/21 0420 132/78 -- -- (!) 122 (!) 37 96 % -- --  11/23/21 0410 133/70 -- -- (!) 120 (!) 33 97 % -- --  11/23/21 0400 114/62 98.8 F (37.1 C) Oral (!) 121 (!) 32 95 % -- --  11/23/21 0350 105/78 -- --  (!) 117 (!) 36 99 % -- --  11/23/21 0340 (!) 111/95 -- -- (!) 115 (!) 35 98 % -- --  11/23/21 0330 (!) 131/101 -- -- (!) 120 (!) 34 96 % -- --  11/23/21 0325 123/61 -- -- (!) 121 (!) 35 99 % -- --  11/23/21 0310 114/64 -- -- (!) 117 (!) 32 97 % -- --  11/23/21  0303 122/70 -- -- (!) 116 (!) 28 96 % -- --  11/23/21 0301 -- -- -- (!) 112 (!) 31 96 % -- --  11/23/21 0300 (!) 77/51 -- -- (!) 115 (!) 23 94 % -- --  11/23/21 0255 100/70 -- -- (!) 122 (!) 32 95 % -- --  11/23/21 0250 100/70 -- -- (!) 119 18 92 % -- --  11/23/21 0244 -- -- -- -- -- 98 % -- --  11/23/21 0240 98/60 -- -- (!) 122 (!) 33 92 % -- --  11/23/21 0230 (!) 87/49 -- -- (!) 115 (!) 27 96 % -- --  11/23/21 0220 102/72 -- -- (!) 114 (!) 25 95 % -- --  11/23/21 0214 (!) 113/57 -- -- (!) 118 (!) 25 93 % -- --  11/23/21 0212 (!) 113/57 -- -- (!) 120 (!) 26 91 % -- --  11/23/21 0210 (!) 220/76 -- -- (!) 126 (!) 30 93 % -- --  11/23/21 0200 (!) 169/41 -- -- (!) 128 (!) 26 91 % -- --  11/23/21 0150 (!) 154/52 -- -- (!) 121 (!) 29 91 % -- --  11/23/21 0140 134/80 -- -- (!) 130 20 92 % -- --  11/23/21 0130 112/66 -- -- (!) 112 (!) 25 97 % -- --  11/23/21 0120 119/63 -- -- (!) 117 (!) 27 94 % -- --  11/23/21 0100 (!) 106/55 -- -- (!) 113 (!) 24 96 % -- --  11/23/21 0050 109/68 -- -- (!) 115 (!) 25 95 % -- --  11/23/21 0040 113/64 -- -- (!) 114 (!) 26 96 % -- --  11/23/21 0030 112/66 -- -- (!) 115 (!) 25 96 % -- --  11/23/21 0020 (!) 95/39 -- -- (!) 104 (!) 24 94 % -- --  11/23/21 0010 105/73 -- -- (!) 117 (!) 24 91 % -- --  11/23/21 0000 122/72 99.8 F (37.7 C) Axillary (!) 116 (!) 29 96 % -- --  11/16/2021 2350 (!) 118/58 -- -- (!) 119 (!) 23 95 % -- --  11/17/2021 2340 (!) 109/49 -- -- (!) 117 (!) 22 95 % -- --  12/07/2021 2310 107/64 -- -- (!) 117 (!) 22 95 % -- --  11/21/2021 2300 (!) 99/52 -- -- (!) 121 (!) 22 96 % -- --  11/30/2021 2250 (!) 93/53 -- -- (!) 116 19 97 % -- --  12/06/2021 2240 (!) 110/59 -- -- -- (!) 22 -- -- --   12/14/2021 2230 (!) 97/48 -- -- -- 17 -- -- --  11/20/2021 2220 (!) 99/58 -- -- -- (!) 23 -- -- --  11/28/2021 2210 (!) 112/46 -- -- (!) 116 (!) 27 (!) 77 % -- --  12/03/2021 2200 (!) 109/56 98.3 F (36.8 C) Oral (!) 122 (!) 25 97 % -- --  11/18/2021 2150 (!) 96/39 -- -- (!) 119 (!) 24 95 % -- --  12/09/2021 2140 (!) 93/46 -- -- (!) 118 (!) 22 96 % -- --  12/03/2021 2130 (!) 83/43 -- -- (!) 123 (!) 24 98 % -- --  11/20/2021 2120 (!) 93/52 -- -- (!) 117 (!) 24 98 % -- --  11/16/2021 2110 97/70 -- -- (!) 118 (!) 26 97 % -- --  12/08/2021 2100 (!) 93/48 -- -- (!) 121 (!) 23 97 % -- --  11/25/2021 2050 (!) 87/50 -- -- (!) 118 (!) 23 98 % -- --  11/21/2021 2040 (!) 98/50 -- -- Marland Kitchen  122 (!) 24 97 % -- --  12/02/2021 2030 (!) 87/52 -- -- (!) 120 (!) 24 97 % -- --  12/03/2021 2020 (!) 86/45 (!) 101.2 F (38.4 C) Axillary (!) 115 (!) 24 (!) 88 % -- --  11/16/2021 2010 94/68 -- -- (!) 122 (!) 22 93 % -- --  11/15/2021 2000 -- -- -- (!) 120 (!) 25 95 % -- --  11/30/2021 1950 97/67 -- -- (!) 120 (!) 24 94 % -- --  12/14/2021 1940 (!) 97/55 -- -- (!) 121 (!) 25 95 % -- --  12/08/2021 1919 98/62 -- -- (!) 127 (!) 34 100 % -- --  12/07/2021 1915 (!) 91/30 -- -- (!) 119 (!) 28 97 % -- --  12/14/2021 1912 (!) 92/42 -- -- (!) 128 (!) 26 96 % -- --  12/14/2021 1909 (!) 82/36 -- -- (!) 123 (!) 26 96 % -- --  12/04/2021 1906 (!) 90/45 -- -- (!) 127 (!) 26 95 % -- --  12/01/2021 1903 (!) 106/48 -- -- (!) 132 (!) 24 95 % -- --  12/01/2021 1900 (!) 86/47 -- -- (!) 126 (!) 24 96 % -- --  11/16/2021 1855 (!) 88/56 -- -- -- -- -- -- --  11/19/2021 1850 (!) 75/56 -- -- -- -- -- -- --  12/01/2021 1845 (!) 76/41 -- -- -- -- -- -- --  11/18/2021 1840 (!) 195/100 -- -- -- -- -- -- --  12/09/2021 1831 -- -- -- -- -- 98 % -- --  12/07/2021 1830 (!) 156/116 (!) 102.4 F (39.1 C) Axillary (!) 128 (!) 22 -- _0  (1.676 m) 156 lb 8.4 oz (71 kg)  12/01/2021 1821 (!) 75/44 -- -- (!) 125 (!) 26 95 % -- --  11/19/2021 1759 (!) 86/58 -- -- (!) 127 (!) 26 95 % -- --  11/27/2021 1755 (!)  82/50 -- -- (!) 121 (!) 25 96 % -- --  11/18/2021 1750 -- -- -- (!) 133 (!) 25 96 % -- --  12/05/2021 1745 (!) 76/59 -- -- (!) 126 (!) 26 96 % -- --  11/27/2021 1740 (!) 78/56 -- -- (!) 122 (!) 24 97 % -- --  11/19/2021 1736 -- 98.5 F (36.9 C) Oral -- -- -- -- --  12/01/2021 1734 (!) 84/56 -- -- (!) 124 (!) 22 96 % -- --  12/05/2021 1731 (!) 87/53 -- -- (!) 132 (!) 24 96 % -- --  11/15/2021 1716 (!) 82/70 -- -- (!) 123 (!) 23 97 % -- --  12/03/2021 1705 105/90 -- -- (!) 129 (!) 25 95 % -- --  11/17/2021 1700 (!) 93/58 -- -- (!) 130 (!) 24 (!) 87 % -- --  12/10/2021 1645 (!) 93/57 -- -- (!) 126 (!) 22 100 % -- --  11/26/2021 1640 (!) 95/54 -- -- (!) 126 (!) 22 100 % -- --  12/05/2021 1631 (!) 93/58 -- -- (!) 130 (!) 23 100 % -- --  11/19/2021 1615 90/68 -- -- (!) 124 (!) 23 100 % -- --  11/27/2021 1600 112/66 -- -- (!) 130 (!) 24 100 % -- --  11/18/2021 1545 115/69 -- -- (!) 131 (!) 23 100 % -- --  12/14/2021 1534 99/80 -- -- (!) 132 (!) 23 100 % -- --  12/13/2021 1529 96/80 -- -- (!) 138 20 100 % -- --  12/14/2021 1520 (!) 89/64 -- -- (!) 133 (!) 24 99 % -- --  11/23/2021 1515 (!)  71/53 -- -- (!) 137 -- 98 % -- --  11/27/2021 1504 -- -- -- -- -- 95 % -- --  11/23/2021 1501 (!) 97/36 -- -- -- -- -- -- --  11/17/2021 1459 123/85 -- -- (!) 141 (!) 23 98 % -- --  12/11/2021 1451 123/85 -- -- (!) 137 (!) 25 (!) 89 % -- --  11/30/2021 1444 -- -- -- -- -- (!) 86 % -- --    General: Sedated.   Eyes:  no scleral icterus.   ENT:   ETT in place.   Respiratory: Tachypneic, coarse crackles Cardiovascular: Tachycardic, 1+ edema to lower extremities bilaterally GI: Soft, bowel sounds positive Skin: Ecchymoses to the bilateral arms Neuro: Intubated, sedated   Lab Results  Component Value Date   WBC 28.4 (H) 11/23/2021   HGB 11.8 (L) 11/23/2021   HCT 37.3 (L) 11/23/2021   PLT 237 11/23/2021   GLUCOSE 140 (H) 11/23/2021   CHOL 129 01/14/2012   TRIG 88 01/14/2012   HDL 48 01/14/2012   LDLCALC 63 01/14/2012   ALT 12 12/06/2021   AST  19 12/14/2021   NA 129 (L) 11/23/2021   K 4.2 11/23/2021   CL 99 11/23/2021   CREATININE 0.87 11/23/2021   BUN 14 11/23/2021   CO2 20 (L) 11/23/2021    CT ABDOMEN PELVIS WO CONTRAST  Result Date: 11/19/2021 CLINICAL DATA:  Hypercoagulable state.  Concern for PE. EXAM: CT CHEST, ABDOMEN AND PELVIS WITHOUT CONTRAST TECHNIQUE: Multidetector CT imaging of the chest, abdomen and pelvis was performed following the standard protocol without IV contrast. RADIATION DOSE REDUCTION: This exam was performed according to the departmental dose-optimization program which includes automated exposure control, adjustment of the mA and/or kV according to patient size and/or use of iterative reconstruction technique. COMPARISON:  Chest, abdomen and pelvis CT no contrast 10/26/2021, and chest, abdomen and pelvis CT with contrast 07/13/2021. FINDINGS: CT chest without contrast findings Cardiovascular: There is mild cardiomegaly, small pericardial effusion, scattered calcifications in the aortic valve annulus and heavy calcifications in the left lateral and inferior mitral annulus. 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 Right IJ port catheter tip remains in the SVC. There is moderate atherosclerosis in the thoracic aorta and great vessels and 4.1 cm aneurysmal ectasia, unchanged in the ascending segment, the remainder normal in caliber. Lipomatous hypertrophy of the inter-atrial septum appears similar as well as upper limit of normal caliber the pulmonary arteries. Assessment for arterial emboli can't be made without contrast. The superior pulmonary veins are more distended than previously. Mediastinum/Nodes: Patulous esophagus containing scattered fluid, mild distal esophageal wall thickening is unchanged. The lower poles of the thyroid gland and axillary spaces are unremarkable, with the thyroid partially obscured by streak artifact from bilateral shoulder replacements.  No intrathoracic adenopathy is appreciated without contrast, but noncontrast assessment is somewhat limited. There are retained versus aspirated secretions in the trachea and in the posterior right main bronchus of which were noted on the last CT. Lungs/Pleura: There are increased small symmetric layering pleural effusions. Increased subpleural septal thickening in the lung bases and apices is seen consistent with interstitial edema with slight central interstitial edema also believed present. There is increased posterior consolidation or atelectasis in both lower lobes. There is increased perihilar airspace disease in both upper lobes and increased scattered ground-glass opacities in the peripheral apices, both findings greater on the right. Increased peripheral subpleural consolidation is similar to last month's study but certainly increased from  07/13/2021, with background subpleural reticulation in the mid to lower lung fields noted at that time without honeycombing. There is no pneumothorax. Central bronchial thickening continues to be noted in all lobes. Musculoskeletal: Osteopenia and degenerative change thoracic spine. No displaced rib or sternal fracture. Subacute mild-to-moderate compression fractures T11 vertebral bodies are again noted. Fractures were probably recent to early subacute on the 10/26/2021 exam and there is increased subcortical vacuum phenomenon upper plate of O37 and lower plate of G90 and in the disc space compared to the prior study, at T12 with few mm retropulsion again shown. There is only mild spinal canal stenosis associated with this. No pedicle or posterior element fracture. There is edema in the chest wall which is likely congestive or from fluid overload. Artifact from bilateral shoulder replacements is again noted. CT ABDOMEN PELVIS FINDINGS Factors affecting image quality: Respiratory motion limits fine detail in the abdomen as well as artifact from the patient's arms.  Hepatobiliary: No focal liver abnormality is seen through the motion and streak artifact. The gallbladder is distended and otherwise unremarkable. There is no biliary dilatation. Pancreas: No focal abnormality is seen through artifacts. No ductal dilatation or adjacent edema. Spleen: No focal noncontrast CT abnormality or splenomegaly.Stranding along the spleen noted on the last CT is not seen today. Adrenals/Urinary Tract: There is no adrenal mass. Bilateral perinephric stranding appears similar with minimal perinephric fluid. No evidence of urinary stone or obstruction, no focal abnormality of the renal cortex allowing for spray artifact from adjacent metallic spinal hardware. There is mild bladder thickening versus underdistention. No masslike thickening is seen. Stomach/Bowel: No dilated or inflamed bowel segments are identified. There is a normal caliber appendix. There is mild-to-moderate fecal stasis in the rectosigmoid colon. Left colonic diverticula without inflammation. Small hiatal hernia. Vascular/Lymphatic: Heavy aortoiliac atherosclerosis is seen. No AAA is seen. No appreciable adenopathy through the artifacts. Reproductive: Prominent prostate 5.1 cm transverse with central calcifications. Other: There is minimal abdominal and pelvic free ascites. There is body wall anasarca increased over prior study. Small umbilical and inguinal fat hernias. 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 IMPRESSION: 1. Findings consistent with CHF or fluid overload, with cardiomegaly, prominent superior pulmonary veins, at least mild features of interstitial edema in the lungs, small pleural effusions and body wall anasarca. Minimal ascites in the abdomen and pelvis. 2. 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 Annual CTA or MRA follow-up  recommended and vascular surgery referral unless already done. 3. Increased airspace disease in the lung fields, probably a combination of pneumonia and edema. Background subpleural reticulation in the mid and lower lungs. Follow-up study recommended after treatment. 4. Bilateral bronchial thickening is similar to the last CT as well as aspirated or retained secretions in the trachea and right main bronchus. 5. Mild-to-moderate wedge compression fractures of T11 and 12, similar in degree but with increased vacuum phenomenon in the vertebral bodies and intervening disc space since the last CT. 6. Small hiatal hernia with similar distal esophageal thickening. 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 Clinical correlation advised. 7. Mild bladder thickening versus nondistention.  Prostatomegaly. 8. Rectosigmoid constipation without bowel obstruction or gross inflammatory reaction. 9. Distended but otherwise unremarkable gallbladder. 10. Aortic and coronary artery atherosclerosis Electronically Signed   By: Telford Nab M.D.   On: 11/30/2021 23:24   CT HEAD WO CONTRAST  Result Date: 10/26/2021 CLINICAL DATA:  Moderate to severe head trauma EXAM: CT HEAD WITHOUT CONTRAST CT CERVICAL SPINE WITHOUT CONTRAST TECHNIQUE: Multidetector CT imaging of the head and cervical spine was performed following the standard protocol without intravenous contrast. Multiplanar CT image reconstructions of the cervical spine were also generated. RADIATION DOSE REDUCTION: This exam was performed according to the departmental dose-optimization program which includes automated exposure control, adjustment of the mA and/or kV according to patient size and/or use of iterative reconstruction technique. COMPARISON:  None. FINDINGS: CT HEAD FINDINGS Brain: No evidence of acute infarction, hemorrhage, hydrocephalus, extra-axial collection or mass lesion/mass effect. 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  Cerebral volume loss, central predominant. Small, incidental calcification in the inter hemispheric fissure. Vascular: No hyperdense vessel or unexpected calcification. Skull: No acute fracture. Sinuses/Orbits: Bilateral cataract resection. Fluid levels that are low-density in the left maxillary and sphenoid sinuses. Left mastoid and middle ear opacification that is subtotal. CT CERVICAL SPINE FINDINGS Alignment: Reversal of cervical lordosis. Skull base and vertebrae: Streak artifact intermittent motion affects visualization, streak artifact heavily affecting the C1 and C2 levels. No convincing fracture. Generalized osteopenia. Soft tissues and spinal canal: No prevertebral fluid or swelling. No visible canal hematoma. Disc levels: Generalized degenerative disc narrowing and ridging. Multilevel facet spurring. Upper chest: Negative IMPRESSION: 1. No evidence of acute intracranial injury. Negative for cervical spine fracture. 2. Left-sided sinusitis with fluid levels. Left mastoid and middle ear opacification. 3. 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 Electronically Signed   By: Jorje Guild M.D.   On: 10/26/2021 10:26   CT CHEST WO CONTRAST  Result Date: 11/28/2021 CLINICAL DATA:  Hypercoagulable state.  Concern for PE. EXAM: CT CHEST, ABDOMEN AND PELVIS WITHOUT CONTRAST TECHNIQUE: Multidetector CT imaging of the chest, abdomen and pelvis was performed following the standard protocol without IV contrast. RADIATION DOSE REDUCTION: This exam was performed according to the departmental dose-optimization program which includes automated exposure control, adjustment of the mA and/or kV according to patient size and/or use of iterative reconstruction technique. COMPARISON:  Chest, abdomen and pelvis CT no contrast 10/26/2021, and chest, abdomen and pelvis CT with contrast 07/13/2021. FINDINGS: CT chest without contrast findings Cardiovascular: There is mild cardiomegaly, small pericardial  effusion, scattered calcifications in the aortic valve annulus and heavy calcifications in the left lateral and inferior mitral annulus. 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 Right IJ port catheter tip remains in the SVC. There is moderate atherosclerosis in the thoracic aorta and great vessels and 4.1 cm aneurysmal ectasia, unchanged in the ascending segment, the remainder normal in caliber. Lipomatous hypertrophy of the inter-atrial septum appears similar as well as upper limit of normal caliber the pulmonary arteries. Assessment for arterial emboli can't be made without contrast. The superior pulmonary veins are more distended than previously. Mediastinum/Nodes: Patulous esophagus containing scattered fluid, mild distal esophageal wall thickening is unchanged. The lower poles of the thyroid gland and axillary spaces are unremarkable, with the thyroid partially obscured by streak artifact from bilateral shoulder replacements. No intrathoracic adenopathy is appreciated without contrast, but noncontrast assessment is somewhat limited. There are retained versus aspirated secretions in the trachea and in the posterior right main bronchus of which were noted on the last CT. Lungs/Pleura: There are increased small symmetric layering pleural effusions. Increased subpleural septal thickening in the lung bases and apices is seen consistent with interstitial edema with slight central interstitial edema also believed present. There  is increased posterior consolidation or atelectasis in both lower lobes. There is increased perihilar airspace disease in both upper lobes and increased scattered ground-glass opacities in the peripheral apices, both findings greater on the right. Increased peripheral subpleural consolidation is similar to last month's study but certainly increased from 07/13/2021, with background subpleural reticulation in the mid to lower lung fields noted  at that time without honeycombing. There is no pneumothorax. Central bronchial thickening continues to be noted in all lobes. Musculoskeletal: Osteopenia and degenerative change thoracic spine. No displaced rib or sternal fracture. Subacute mild-to-moderate compression fractures T11 vertebral bodies are again noted. Fractures were probably recent to early subacute on the 10/26/2021 exam and there is increased subcortical vacuum phenomenon upper plate of Z61 and lower plate of W96 and in the disc space compared to the prior study, at T12 with few mm retropulsion again shown. There is only mild spinal canal stenosis associated with this. No pedicle or posterior element fracture. There is edema in the chest wall which is likely congestive or from fluid overload. Artifact from bilateral shoulder replacements is again noted. CT ABDOMEN PELVIS FINDINGS Factors affecting image quality: Respiratory motion limits fine detail in the abdomen as well as artifact from the patient's arms. Hepatobiliary: No focal liver abnormality is seen through the motion and streak artifact. The gallbladder is distended and otherwise unremarkable. There is no biliary dilatation. Pancreas: No focal abnormality is seen through artifacts. No ductal dilatation or adjacent edema. Spleen: No focal noncontrast CT abnormality or splenomegaly.Stranding along the spleen noted on the last CT is not seen today. Adrenals/Urinary Tract: There is no adrenal mass. Bilateral perinephric stranding appears similar with minimal perinephric fluid. No evidence of urinary stone or obstruction, no focal abnormality of the renal cortex allowing for spray artifact from adjacent metallic spinal hardware. There is mild bladder thickening versus underdistention. No masslike thickening is seen. Stomach/Bowel: No dilated or inflamed bowel segments are identified. There is a normal caliber appendix. There is mild-to-moderate fecal stasis in the rectosigmoid colon. Left  colonic diverticula without inflammation. Small hiatal hernia. Vascular/Lymphatic: Heavy aortoiliac atherosclerosis is seen. No AAA is seen. No appreciable adenopathy through the artifacts. Reproductive: Prominent prostate 5.1 cm transverse with central calcifications. Other: There is minimal abdominal and pelvic free ascites. There is body wall anasarca increased over prior study. Small umbilical and inguinal fat hernias. 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 IMPRESSION: 1. Findings consistent with CHF or fluid overload, with cardiomegaly, prominent superior pulmonary veins, at least mild features of interstitial edema in the lungs, small pleural effusions and body wall anasarca. Minimal ascites in the abdomen and pelvis. 2. 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 Annual CTA or MRA follow-up recommended and vascular surgery referral unless already done. 3. Increased airspace disease in the lung fields, probably a combination of pneumonia and edema. Background subpleural reticulation in the mid and lower lungs. Follow-up study recommended after treatment. 4. Bilateral bronchial thickening is similar to the last CT as well as aspirated or retained secretions in the trachea and right main bronchus. 5. Mild-to-moderate wedge compression fractures of T11 and 12, similar in degree but with increased vacuum phenomenon in the vertebral bodies and intervening disc space since the last CT. 6. Small hiatal hernia with similar distal esophageal thickening. 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 Clinical correlation advised. 7. Mild  bladder thickening versus nondistention.  Prostatomegaly. 8. Rectosigmoid constipation without bowel obstruction or gross inflammatory reaction. 9. Distended but otherwise unremarkable gallbladder.  10. Aortic and coronary artery atherosclerosis Electronically Signed   By: Telford Nab M.D.   On: 12/12/2021 23:24   CT CERVICAL SPINE WO CONTRAST  Result Date: 10/26/2021 CLINICAL DATA:  Moderate to severe head trauma EXAM: CT HEAD WITHOUT CONTRAST CT CERVICAL SPINE WITHOUT CONTRAST TECHNIQUE: Multidetector CT imaging of the head and cervical spine was performed following the standard protocol without intravenous contrast. Multiplanar CT image reconstructions of the cervical spine were also generated. RADIATION DOSE REDUCTION: This exam was performed according to the departmental dose-optimization program which includes automated exposure control, adjustment of the mA and/or kV according to patient size and/or use of iterative reconstruction technique. COMPARISON:  None. FINDINGS: CT HEAD FINDINGS Brain: No evidence of acute infarction, hemorrhage, hydrocephalus, extra-axial collection or mass lesion/mass effect. 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 Cerebral volume loss, central predominant. Small, incidental calcification in the inter hemispheric fissure. Vascular: No hyperdense vessel or unexpected calcification. Skull: No acute fracture. Sinuses/Orbits: Bilateral cataract resection. Fluid levels that are low-density in the left maxillary and sphenoid sinuses. Left mastoid and middle ear opacification that is subtotal. CT CERVICAL SPINE FINDINGS Alignment: Reversal of cervical lordosis. Skull base and vertebrae: Streak artifact intermittent motion affects visualization, streak artifact heavily affecting the C1 and C2 levels. No convincing fracture. Generalized osteopenia. Soft tissues and spinal canal: No prevertebral fluid or swelling. No visible canal hematoma. Disc levels: Generalized degenerative disc narrowing and ridging. Multilevel facet spurring. Upper chest: Negative IMPRESSION: 1. No evidence of acute intracranial injury. Negative for cervical spine fracture. 2.  Left-sided sinusitis with fluid levels. Left mastoid and middle ear opacification. 3. 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 Electronically Signed   By: Jorje Guild M.D.   On: 10/26/2021 10:26   DG Pelvis Portable  Result Date: 10/26/2021 CLINICAL DATA:  Found down.  Weakness EXAM: PORTABLE PELVIS 1-2 VIEWS COMPARISON:  10/20/2021 FINDINGS: Bones appear demineralized. There is no evidence of pelvic fracture or diastasis. No pelvic bone lesions are seen. Large volume of stool projects throughout the visualized colon. Atherosclerotic vascular calcifications are present. IMPRESSION: Negative. Electronically Signed   By: Davina Poke D.O.   On: 10/26/2021 09:33   Portable Chest x-ray  Result Date: 11/23/2021 CLINICAL DATA:  Endotracheal tube placement. EXAM: PORTABLE CHEST 1 VIEW COMPARISON:  One-view chest x-ray 11/23/2021 FINDINGS: Heart is enlarged. Diffuse interstitial and airspace opacities are similar the prior study. Bilateral pleural effusions are present. Right IJ Port-A-Cath is in place. Patient now intubated. The endotracheal tube terminates 5 cm above the carina, in satisfactory position. Bilateral shoulder replacements noted. IMPRESSION: 1. Interval intubation. Satisfactory positioning of the endotracheal tube. 2. Stable diffuse interstitial and airspace opacities compatible with edema or infection. 3. Bilateral pleural effusions. Electronically Signed   By: San Morelle M.D.   On: 11/23/2021 10:27   DG Chest Port 1 View  Result Date: 11/23/2021 CLINICAL DATA:  Initial evaluation for increased shortness of breath. EXAM: PORTABLE CHEST 1 VIEW COMPARISON:  Prior radiograph from 11/17/2021. FINDINGS: Right-sided Port-A-Cath in place, stable. Cardiomegaly grossly unchanged. Mediastinal silhouette within normal limits. Lungs are hypoinflated. Extensive bilateral airspace disease involving predominantly the mid and lower lungs, progressed and worsened from  previous, and likely reflecting worsened pulmonary edema. Superimposed small to moderate bilateral pleural effusions, also progressed and worsened. Probable superimposed bibasilar atelectasis. Superimposed infiltrates difficult to  exclude. No pneumothorax. Visualized osseous structures are unchanged. Bilateral shoulder arthroplasties partially visualized. IMPRESSION: 1. Interval worsening in extensive bilateral airspace disease involving primarily the mid and lower lungs, likely reflecting worsened pulmonary edema. 2. Superimposed small to moderate bilateral pleural effusions, also progressed and worsened. Findings suggest progressive CHF. Electronically Signed   By: Jeannine Boga M.D.   On: 11/23/2021 02:52   DG Chest Port 1 View  Result Date: 12/10/2021 CLINICAL DATA:  Hypoxia, severe epigastric and abdominal pain. EXAM: PORTABLE CHEST 1 VIEW COMPARISON:  Radiographs dated October 30, 2021 FINDINGS: The heart is enlarged. Bilateral perihilar and lower lobe opacities concerning for airspace disease. Right IJ access Port-A-Cath with distal tip in the SVC, unchanged. Bilateral shoulder arthroplasty. IMPRESSION: 1.  Stable cardiomegaly. 2. Bilateral perihilar and lower lobe hazy opacities, which may represent pulmonary edema or infiltrate. Clinical correlation is suggested. Electronically Signed   By: Keane Police D.O.   On: 11/20/2021 15:40   DG CHEST PORT 1 VIEW  Result Date: 10/30/2021 CLINICAL DATA:  Dyspnea EXAM: PORTABLE CHEST 1 VIEW COMPARISON:  Chest radiograph from one day prior. FINDINGS: Right internal jugular Port-A-Cath terminates in the middle third of the SVC. Right shoulder hemiarthroplasty and partially visualized left shoulder total arthroplasty. Stable cardiomediastinal silhouette with mild cardiomegaly. No pneumothorax. No pleural effusion. Hazy streaky opacities in peripheral mid to lower left lung, similar. Largely resolved hazy right lung base opacities. IMPRESSION: 1. Largely  resolved hazy right lung base opacities. 2. Stable hazy streaky opacities in the peripheral mid to lower left lung, favoring pneumonia. 3. Stable cardiomegaly. Electronically Signed   By: Ilona Sorrel M.D.   On: 10/30/2021 08:05   DG CHEST PORT 1 VIEW  Result Date: 10/29/2021 CLINICAL DATA:  76 year old male with history of shortness of breath. EXAM: PORTABLE CHEST 1 VIEW COMPARISON:  Chest x-ray 10/28/2021. FINDINGS: Right internal jugular single-lumen power porta cath with tip terminating in the mid superior vena cava. Lung volumes are slightly low. Diffuse peribronchial cuffing and widespread interstitial prominence, with patchy ill-defined peripheral predominant airspace disease, similar to the prior study. No pneumothorax. No suspicious appearing pulmonary nodules or masses are noted. Heart size is normal. Upper mediastinal contours are within normal limits. Status post bilateral shoulder arthroplasties. IMPRESSION: 1. The appearance the chest again suggests multilobar bilateral bronchopneumonia, with similar aeration compared to the prior examination. 2. Postoperative changes and support apparatus, as above. Electronically Signed   By: Vinnie Langton M.D.   On: 10/29/2021 09:23   DG CHEST PORT 1 VIEW  Result Date: 10/28/2021 CLINICAL DATA:  Shortness of breath Evaluate for pneumonia EXAM: PORTABLE CHEST 1 VIEW COMPARISON:  10/26/2021 FINDINGS: Unchanged mild cardiomegaly. Right chest port is unchanged in position. Bilateral shoulder prostheses again seen. Mild pulmonary vascular congestion is unchanged. Peripheral lung opacities are not significantly changed compared to prior CT exam. IMPRESSION: 1. Unchanged mild cardiomegaly. 2. Unchanged bilateral subpleural parenchymal opacities. Electronically Signed   By: Miachel Roux M.D.   On: 10/28/2021 13:42   DG Chest Port 1 View  Result Date: 10/26/2021 CLINICAL DATA:  Family found the patient on the floor, last seen yesterday after chemotherapy when  patient complained of weakness. Patient does not remember falling. EXAM: PORTABLE CHEST 1 VIEW COMPARISON:  CT examination dated October 20, 2021 FINDINGS: The heart is enlarged. There are bilateral lower lobe opacities, left worse than the right concerning for pneumonia including aspiration pneumonia. Bilateral shoulder arthroplasty. Right IJ access MediPort with distal tip in the SVC. IMPRESSION: 1.  Stable cardiomegaly. 2. Bilateral lower lobe opacities, left worse than the right concerning for pneumonia including aspiration pneumonia. Follow-up examination to resolution is recommended. Electronically Signed   By: Keane Police D.O.   On: 10/26/2021 09:35   DG Abd Portable 1V  Result Date: 11/23/2021 CLINICAL DATA:  Placement enteric tube EXAM: PORTABLE ABDOMEN - 1 VIEW COMPARISON:  11/04/2015 FINDINGS: Tip of enteric tube is seen in the stomach. Bowel gas pattern in the upper abdomen is unremarkable. Heart is enlarged in size. There is prominence of pulmonary vessels along with interstitial and alveolar densities in both lungs, possibly suggesting pulmonary edema. Dextroscoliosis and degenerative changes are noted in lumbar spine. There is surgical fusion in the lumbar spine at L3-L4 level. IMPRESSION: Tip of enteric tube is seen in the stomach. Electronically Signed   By: Elmer Picker M.D.   On: 11/23/2021 13:38   CT CHEST ABDOMEN PELVIS WO CONTRAST  Result Date: 10/26/2021 CLINICAL DATA:  76 year old male presents for evaluation following fall. Patient also with history of esophageal neoplasm. EXAM: CT CHEST, ABDOMEN AND PELVIS WITHOUT CONTRAST TECHNIQUE: Multidetector CT imaging of the chest, abdomen and pelvis was performed following the standard protocol without IV contrast. RADIATION DOSE REDUCTION: This exam was performed according to the departmental dose-optimization program which includes automated exposure control, adjustment of the mA and/or kV according to patient size and/or use of  iterative reconstruction technique. COMPARISON:  October 20, 2021. FINDINGS: CT CHEST FINDINGS Cardiovascular: The aorta shows smooth contours without adjacent stranding. No mediastinal hematoma. Aortic dilation to approximately 4 cm greatest axial dimension is unchanged. Generalized aortic atherosclerosis. Signs of mitral annular calcification. Three-vessel coronary artery disease. No substantial pericardial effusion, stable small pericardial effusion when compared to previous imaging from just 6 days ago RIGHT-sided Port-A-Cath terminates at the caval to atrial junction. Mediastinum/Nodes: Patulous esophagus similar to the prior exam. Mild thickening of the distal esophagus also unchanged in this patient with known history of esophageal neoplasm. No mediastinal hematoma or adenopathy. No axillary or thoracic inlet adenopathy. Lungs/Pleura: No pneumothorax. Reticular opacities and nodular changes along the pleural surface with increasing conspicuity since previous imaging. Pleural based airspace disease with nodular features at the lung bases and along the anterior chest, these findings have developed since the previous exam of just 6 days ago. No pneumothorax. Material and the trachea and material layering in the mainstem bronchi extending into peripheral bronchi with signs of bronchial wall thickening. Musculoskeletal: See below for full musculoskeletal details. CT ABDOMEN PELVIS FINDINGS Hepatobiliary: Liver with smooth contours. Trace fluid adjacent to medial RIGHT hemi liver similar to previous imaging. No gross signs of hepatic trauma. No Peri hepatic fluid. No pericholecystic stranding. Pancreas: Pancreatic atrophy without signs of adjacent inflammation. Spleen: Trace stranding adjacent to the spleen. Minimal fluid adjacent to the spleen is similar to the study of October 20, 2021. No gross splenic abnormality. Adrenals/Urinary Tract: Mild bilateral adrenal thickening. Perinephric stranding bilaterally  perhaps slightly increased compared to previous imaging. Generalized increase in fascial thickening and edema albeit mild since the previous study. No hydronephrosis.  No perinephric hematoma. Stomach/Bowel: Small hiatal hernia and distal esophageal thickening as described. No signs of bowel dilation to suggest obstruction of the small bowel. The appendix is normal. Colon is stool filled without signs of adjacent stranding. No signs of pneumatosis. No pneumoperitoneum. No pelvic fluid. Vascular/Lymphatic: Aortic atherosclerosis. No sign of aneurysm. Smooth contour of the IVC. There is no gastrohepatic or hepatoduodenal ligament lymphadenopathy. No retroperitoneal or mesenteric lymphadenopathy. No pelvic  sidewall lymphadenopathy. Atherosclerosis is moderate to marked. Vascular structures not well assessed given the lack of intravenous contrast. No focal stranding or hematoma adjacent to the abdominal aorta or pelvic vessels. Reproductive: Unremarkable by CT. Urinary bladder is moderately distended extending to the sacral promontory. Other: No fluid in the pelvis. Trace fluid about the liver and spleen similar to recent comparison imaging. Musculoskeletal: No displaced rib fractures. Mildly limited assessment due to respiratory motion. Bony pelvis without signs of fracture. Extensive spinal degenerative changes and osteopenia. Post bilateral shoulder arthroplasty. Visualized clavicles and scapulae are intact. Signs of prior L3-L4 spinal fusion and cement augmentation at L4 showing no change. Spinal compression fractures with similar appearance at the T11-T12 level based on recent comparison imaging with approximally 30% loss of height at these levels. Spinal alignment is unchanged. Sternum grossly intact with some artifact crossing the anterior sternum due to motion. IMPRESSION: 1. No evidence of acute traumatic injury to the chest, abdomen or pelvis. 2. Peripheral nodular parenchymal consolidative changes with marked  worsening since the recent comparison imaging potentially post/drug treatment related given pattern seen and rapid interval progression. Correlate with any respiratory symptoms. Findings are felt to have an overlay of acute inflammatory changes related to aspiration given findings in the bronchi. However, subpleural changes extend along the anterior chest which would be atypical for purely aspiration related changes. 3. Small hiatal hernia and distal esophageal thickening also unchanged in this patient with known history of esophageal neoplasm. 4. Mild generalized edema with perinephric stranding and fascial edema in the abdomen not substantially changed compared to recent imaging. 5. Unchanged appearance of T11-T12 compression fractures. 6. Aortic atherosclerosis. Three-vessel coronary artery disease. 7. Ascending thoracic aortic aneurysm. Recommend annual imaging followup by CTA or MRA. This recommendation follows 2010 ACCF/AHA/AATS/ACR/ASA/SCA/SCAI/SIR/STS/SVM Guidelines for the Diagnosis and Management of Patients with Thoracic Aortic Disease. Circulation. 2010; 121: O175-Z025. Aortic aneurysm NOS (ICD10-I71.9) Aortic Atherosclerosis (ICD10-I70.0). Electronically Signed   By: Zetta Bills M.D.   On: 10/26/2021 10:31     CT ABDOMEN PELVIS WO CONTRAST  Result Date: 11/21/2021 CLINICAL DATA:  Hypercoagulable state.  Concern for PE. EXAM: CT CHEST, ABDOMEN AND PELVIS WITHOUT CONTRAST TECHNIQUE: Multidetector CT imaging of the chest, abdomen and pelvis was performed following the standard protocol without IV contrast. RADIATION DOSE REDUCTION: This exam was performed according to the departmental dose-optimization program which includes automated exposure control, adjustment of the mA and/or kV according to patient size and/or use of iterative reconstruction technique. COMPARISON:  Chest, abdomen and pelvis CT no contrast 10/26/2021, and chest, abdomen and pelvis CT with contrast 07/13/2021. FINDINGS: CT chest  without contrast findings Cardiovascular: There is mild cardiomegaly, small pericardial effusion, scattered calcifications in the aortic valve annulus and heavy calcifications in the left lateral and inferior mitral annulus. 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 Right IJ port catheter tip remains in the SVC. There is moderate atherosclerosis in the thoracic aorta and great vessels and 4.1 cm aneurysmal ectasia, unchanged in the ascending segment, the remainder normal in caliber. Lipomatous hypertrophy of the inter-atrial septum appears similar as well as upper limit of normal caliber the pulmonary arteries. Assessment for arterial emboli can't be made without contrast. The superior pulmonary veins are more distended than previously. Mediastinum/Nodes: Patulous esophagus containing scattered fluid, mild distal esophageal wall thickening is unchanged. The lower poles of the thyroid gland and axillary spaces are unremarkable, with the thyroid partially obscured by streak artifact from bilateral shoulder replacements. No intrathoracic  adenopathy is appreciated without contrast, but noncontrast assessment is somewhat limited. There are retained versus aspirated secretions in the trachea and in the posterior right main bronchus of which were noted on the last CT. Lungs/Pleura: There are increased small symmetric layering pleural effusions. Increased subpleural septal thickening in the lung bases and apices is seen consistent with interstitial edema with slight central interstitial edema also believed present. There is increased posterior consolidation or atelectasis in both lower lobes. There is increased perihilar airspace disease in both upper lobes and increased scattered ground-glass opacities in the peripheral apices, both findings greater on the right. Increased peripheral subpleural consolidation is similar to last month's study but certainly increased from  07/13/2021, with background subpleural reticulation in the mid to lower lung fields noted at that time without honeycombing. There is no pneumothorax. Central bronchial thickening continues to be noted in all lobes. Musculoskeletal: Osteopenia and degenerative change thoracic spine. No displaced rib or sternal fracture. Subacute mild-to-moderate compression fractures T11 vertebral bodies are again noted. Fractures were probably recent to early subacute on the 10/26/2021 exam and there is increased subcortical vacuum phenomenon upper plate of K44 and lower plate of W10 and in the disc space compared to the prior study, at T12 with few mm retropulsion again shown. There is only mild spinal canal stenosis associated with this. No pedicle or posterior element fracture. There is edema in the chest wall which is likely congestive or from fluid overload. Artifact from bilateral shoulder replacements is again noted. CT ABDOMEN PELVIS FINDINGS Factors affecting image quality: Respiratory motion limits fine detail in the abdomen as well as artifact from the patient's arms. Hepatobiliary: No focal liver abnormality is seen through the motion and streak artifact. The gallbladder is distended and otherwise unremarkable. There is no biliary dilatation. Pancreas: No focal abnormality is seen through artifacts. No ductal dilatation or adjacent edema. Spleen: No focal noncontrast CT abnormality or splenomegaly.Stranding along the spleen noted on the last CT is not seen today. Adrenals/Urinary Tract: There is no adrenal mass. Bilateral perinephric stranding appears similar with minimal perinephric fluid. No evidence of urinary stone or obstruction, no focal abnormality of the renal cortex allowing for spray artifact from adjacent metallic spinal hardware. There is mild bladder thickening versus underdistention. No masslike thickening is seen. Stomach/Bowel: No dilated or inflamed bowel segments are identified. There is a normal  caliber appendix. There is mild-to-moderate fecal stasis in the rectosigmoid colon. Left colonic diverticula without inflammation. Small hiatal hernia. Vascular/Lymphatic: Heavy aortoiliac atherosclerosis is seen. No AAA is seen. No appreciable adenopathy through the artifacts. Reproductive: Prominent prostate 5.1 cm transverse with central calcifications. Other: There is minimal abdominal and pelvic free ascites. There is body wall anasarca increased over prior study. Small umbilical and inguinal fat hernias. 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 IMPRESSION: 1. Findings consistent with CHF or fluid overload, with cardiomegaly, prominent superior pulmonary veins, at least mild features of interstitial edema in the lungs, small pleural effusions and body wall anasarca. Minimal ascites in the abdomen and pelvis. 2. 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 Annual CTA or MRA follow-up recommended and vascular surgery referral unless already done. 3. Increased airspace disease in the lung fields, probably a combination of pneumonia and edema. Background subpleural reticulation in the mid and lower lungs. Follow-up study recommended after treatment. 4. Bilateral bronchial thickening is similar  to the last CT as well as aspirated or retained secretions in the trachea and right main bronchus. 5. Mild-to-moderate wedge compression fractures of T11 and 12, similar in degree but with increased vacuum phenomenon in the vertebral bodies and intervening disc space since the last CT. 6. Small hiatal hernia with similar distal esophageal thickening. 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 Clinical correlation advised. 7. Mild bladder thickening versus nondistention.  Prostatomegaly. 8. Rectosigmoid constipation without bowel  obstruction or gross inflammatory reaction. 9. Distended but otherwise unremarkable gallbladder. 10. Aortic and coronary artery atherosclerosis Electronically Signed   By: Telford Nab M.D.   On: 12/08/2021 23:24   CT HEAD WO CONTRAST  Result Date: 10/26/2021 CLINICAL DATA:  Moderate to severe head trauma EXAM: CT HEAD WITHOUT CONTRAST CT CERVICAL SPINE WITHOUT CONTRAST TECHNIQUE: Multidetector CT imaging of the head and cervical spine was performed following the standard protocol without intravenous contrast. Multiplanar CT image reconstructions of the cervical spine were also generated. RADIATION DOSE REDUCTION: This exam was performed according to the departmental dose-optimization program which includes automated exposure control, adjustment of the mA and/or kV according to patient size and/or use of iterative reconstruction technique. COMPARISON:  None. FINDINGS: CT HEAD FINDINGS Brain: No evidence of acute infarction, hemorrhage, hydrocephalus, extra-axial collection or mass lesion/mass effect. 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 Cerebral volume loss, central predominant. Small, incidental calcification in the inter hemispheric fissure. Vascular: No hyperdense vessel or unexpected calcification. Skull: No acute fracture. Sinuses/Orbits: Bilateral cataract resection. Fluid levels that are low-density in the left maxillary and sphenoid sinuses. Left mastoid and middle ear opacification that is subtotal. CT CERVICAL SPINE FINDINGS Alignment: Reversal of cervical lordosis. Skull base and vertebrae: Streak artifact intermittent motion affects visualization, streak artifact heavily affecting the C1 and C2 levels. No convincing fracture. Generalized osteopenia. Soft tissues and spinal canal: No prevertebral fluid or swelling. No visible canal hematoma. Disc levels: Generalized degenerative disc narrowing and ridging. Multilevel facet spurring. Upper chest: Negative IMPRESSION: 1. No  evidence of acute intracranial injury. Negative for cervical spine fracture. 2. Left-sided sinusitis with fluid levels. Left mastoid and middle ear opacification. 3. 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 Electronically Signed   By: Jorje Guild M.D.   On: 10/26/2021 10:26   CT CHEST WO CONTRAST  Result Date: 12/07/2021 CLINICAL DATA:  Hypercoagulable state.  Concern for PE. EXAM: CT CHEST, ABDOMEN AND PELVIS WITHOUT CONTRAST TECHNIQUE: Multidetector CT imaging of the chest, abdomen and pelvis was performed following the standard protocol without IV contrast. RADIATION DOSE REDUCTION: This exam was performed according to the departmental dose-optimization program which includes automated exposure control, adjustment of the mA and/or kV according to patient size and/or use of iterative reconstruction technique. COMPARISON:  Chest, abdomen and pelvis CT no contrast 10/26/2021, and chest, abdomen and pelvis CT with contrast 07/13/2021. FINDINGS: CT chest without contrast findings Cardiovascular: There is mild cardiomegaly, small pericardial effusion, scattered calcifications in the aortic valve annulus and heavy calcifications in the left lateral and inferior mitral annulus. 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 Right IJ port catheter tip remains in the SVC. There is moderate atherosclerosis in the thoracic aorta and great vessels and 4.1 cm aneurysmal ectasia, unchanged in the ascending segment, the remainder normal in caliber. Lipomatous hypertrophy of the inter-atrial septum appears similar as well as upper limit of normal caliber the pulmonary arteries. Assessment for arterial emboli can't be made without  contrast. The superior pulmonary veins are more distended than previously. Mediastinum/Nodes: Patulous esophagus containing scattered fluid, mild distal esophageal wall thickening is unchanged. The lower poles of the thyroid  gland and axillary spaces are unremarkable, with the thyroid partially obscured by streak artifact from bilateral shoulder replacements. No intrathoracic adenopathy is appreciated without contrast, but noncontrast assessment is somewhat limited. There are retained versus aspirated secretions in the trachea and in the posterior right main bronchus of which were noted on the last CT. Lungs/Pleura: There are increased small symmetric layering pleural effusions. Increased subpleural septal thickening in the lung bases and apices is seen consistent with interstitial edema with slight central interstitial edema also believed present. There is increased posterior consolidation or atelectasis in both lower lobes. There is increased perihilar airspace disease in both upper lobes and increased scattered ground-glass opacities in the peripheral apices, both findings greater on the right. Increased peripheral subpleural consolidation is similar to last month's study but certainly increased from 07/13/2021, with background subpleural reticulation in the mid to lower lung fields noted at that time without honeycombing. There is no pneumothorax. Central bronchial thickening continues to be noted in all lobes. Musculoskeletal: Osteopenia and degenerative change thoracic spine. No displaced rib or sternal fracture. Subacute mild-to-moderate compression fractures T11 vertebral bodies are again noted. Fractures were probably recent to early subacute on the 10/26/2021 exam and there is increased subcortical vacuum phenomenon upper plate of F64 and lower plate of P32 and in the disc space compared to the prior study, at T12 with few mm retropulsion again shown. There is only mild spinal canal stenosis associated with this. No pedicle or posterior element fracture. There is edema in the chest wall which is likely congestive or from fluid overload. Artifact from bilateral shoulder replacements is again noted. CT ABDOMEN PELVIS FINDINGS  Factors affecting image quality: Respiratory motion limits fine detail in the abdomen as well as artifact from the patient's arms. Hepatobiliary: No focal liver abnormality is seen through the motion and streak artifact. The gallbladder is distended and otherwise unremarkable. There is no biliary dilatation. Pancreas: No focal abnormality is seen through artifacts. No ductal dilatation or adjacent edema. Spleen: No focal noncontrast CT abnormality or splenomegaly.Stranding along the spleen noted on the last CT is not seen today. Adrenals/Urinary Tract: There is no adrenal mass. Bilateral perinephric stranding appears similar with minimal perinephric fluid. No evidence of urinary stone or obstruction, no focal abnormality of the renal cortex allowing for spray artifact from adjacent metallic spinal hardware. There is mild bladder thickening versus underdistention. No masslike thickening is seen. Stomach/Bowel: No dilated or inflamed bowel segments are identified. There is a normal caliber appendix. There is mild-to-moderate fecal stasis in the rectosigmoid colon. Left colonic diverticula without inflammation. Small hiatal hernia. Vascular/Lymphatic: Heavy aortoiliac atherosclerosis is seen. No AAA is seen. No appreciable adenopathy through the artifacts. Reproductive: Prominent prostate 5.1 cm transverse with central calcifications. Other: There is minimal abdominal and pelvic free ascites. There is body wall anasarca increased over prior study. Small umbilical and inguinal fat hernias. 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 IMPRESSION: 1. Findings consistent with CHF or fluid overload, with cardiomegaly, prominent superior pulmonary veins, at least mild features of interstitial edema in the lungs, small pleural effusions and body  wall anasarca. Minimal ascites in the abdomen and pelvis. 2. 4.1 cm aneurysmal  ascending aorta. Annual CTA or MRA follow-up recommended and vascular surgery referral unless already done. 3. Increased airspace disease in the lung fields, probably a combination of pneumonia and edema. Background subpleural reticulation in the mid and lower lungs. Follow-up study recommended after treatment. 4. Bilateral bronchial thickening is similar to the last CT as well as aspirated or retained secretions in the trachea and right main bronchus. 5. Mild-to-moderate wedge compression fractures of T11 and 12, similar in degree but with increased vacuum phenomenon in the vertebral bodies and intervening disc space since the last CT. 6. Small hiatal hernia with similar distal esophageal thickening. 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 Clinical correlation advised. 7. Mild bladder thickening versus nondistention.  Prostatomegaly. 8. Rectosigmoid constipation without bowel obstruction or gross inflammatory reaction. 9. Distended but otherwise unremarkable gallbladder. 10. Aortic and coronary artery atherosclerosis Electronically Signed   By: Telford Nab M.D.   On: 11/30/2021 23:24   CT CERVICAL SPINE WO CONTRAST  Result Date: 10/26/2021 CLINICAL DATA:  Moderate to severe head trauma EXAM: CT HEAD WITHOUT CONTRAST CT CERVICAL SPINE WITHOUT CONTRAST TECHNIQUE: Multidetector CT imaging of the head and cervical spine was performed following the standard protocol without intravenous contrast. Multiplanar CT image reconstructions of the cervical spine were also generated. RADIATION DOSE REDUCTION: This exam was performed according to the departmental dose-optimization program which includes automated exposure control, adjustment of the mA and/or kV according to patient size and/or use of iterative reconstruction technique. COMPARISON:  None. FINDINGS: CT HEAD FINDINGS Brain: No evidence of acute infarction,  hemorrhage, hydrocephalus, extra-axial collection or mass lesion/mass effect. 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 Cerebral volume loss, central predominant. Small, incidental calcification in the inter hemispheric fissure. Vascular: No hyperdense vessel or unexpected calcification. Skull: No acute fracture. Sinuses/Orbits: Bilateral cataract resection. Fluid levels that are low-density in the left maxillary and sphenoid sinuses. Left mastoid and middle ear opacification that is subtotal. CT CERVICAL SPINE FINDINGS Alignment: Reversal of cervical lordosis. Skull base and vertebrae: Streak artifact intermittent motion affects visualization, streak artifact heavily affecting the C1 and C2 levels. No convincing fracture. Generalized osteopenia. Soft tissues and spinal canal: No prevertebral fluid or swelling. No visible canal hematoma. Disc levels: Generalized degenerative disc narrowing and ridging. Multilevel facet spurring. Upper chest: Negative IMPRESSION: 1. No evidence of acute intracranial injury. Negative for cervical spine fracture. 2. Left-sided sinusitis with fluid levels. Left mastoid and middle ear opacification. 3. 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 Electronically Signed   By: Jorje Guild M.D.   On: 10/26/2021 10:26   DG Pelvis Portable  Result Date: 10/26/2021 CLINICAL DATA:  Found down.  Weakness EXAM: PORTABLE PELVIS 1-2 VIEWS COMPARISON:  10/20/2021 FINDINGS: Bones appear demineralized. There is no evidence of pelvic fracture or diastasis. No pelvic bone lesions are seen. Large volume of stool projects throughout the visualized colon. Atherosclerotic vascular calcifications are present. IMPRESSION: Negative. Electronically Signed   By: Davina Poke D.O.   On: 10/26/2021 09:33   Portable Chest x-ray  Result Date: 11/23/2021 CLINICAL DATA:  Endotracheal tube placement. EXAM: PORTABLE CHEST 1 VIEW COMPARISON:  One-view chest x-ray  11/23/2021 FINDINGS: Heart is enlarged. Diffuse interstitial and airspace opacities are similar the prior study. Bilateral pleural effusions are present. Right IJ Port-A-Cath is in place. Patient now intubated. The endotracheal tube terminates 5 cm above the carina, in satisfactory position. Bilateral shoulder replacements noted. IMPRESSION: 1. Interval intubation. Satisfactory positioning of the endotracheal tube. 2. Stable  diffuse interstitial and airspace opacities compatible with edema or infection. 3. Bilateral pleural effusions. Electronically Signed   By: San Morelle M.D.   On: 11/23/2021 10:27   DG Chest Port 1 View  Result Date: 11/23/2021 CLINICAL DATA:  Initial evaluation for increased shortness of breath. EXAM: PORTABLE CHEST 1 VIEW COMPARISON:  Prior radiograph from 11/27/2021. FINDINGS: Right-sided Port-A-Cath in place, stable. Cardiomegaly grossly unchanged. Mediastinal silhouette within normal limits. Lungs are hypoinflated. Extensive bilateral airspace disease involving predominantly the mid and lower lungs, progressed and worsened from previous, and likely reflecting worsened pulmonary edema. Superimposed small to moderate bilateral pleural effusions, also progressed and worsened. Probable superimposed bibasilar atelectasis. Superimposed infiltrates difficult to exclude. No pneumothorax. Visualized osseous structures are unchanged. Bilateral shoulder arthroplasties partially visualized. IMPRESSION: 1. Interval worsening in extensive bilateral airspace disease involving primarily the mid and lower lungs, likely reflecting worsened pulmonary edema. 2. Superimposed small to moderate bilateral pleural effusions, also progressed and worsened. Findings suggest progressive CHF. Electronically Signed   By: Jeannine Boga M.D.   On: 11/23/2021 02:52   DG Chest Port 1 View  Result Date: 12/03/2021 CLINICAL DATA:  Hypoxia, severe epigastric and abdominal pain. EXAM: PORTABLE CHEST 1 VIEW  COMPARISON:  Radiographs dated October 30, 2021 FINDINGS: The heart is enlarged. Bilateral perihilar and lower lobe opacities concerning for airspace disease. Right IJ access Port-A-Cath with distal tip in the SVC, unchanged. Bilateral shoulder arthroplasty. IMPRESSION: 1.  Stable cardiomegaly. 2. Bilateral perihilar and lower lobe hazy opacities, which may represent pulmonary edema or infiltrate. Clinical correlation is suggested. Electronically Signed   By: Keane Police D.O.   On: 12/06/2021 15:40   DG CHEST PORT 1 VIEW  Result Date: 10/30/2021 CLINICAL DATA:  Dyspnea EXAM: PORTABLE CHEST 1 VIEW COMPARISON:  Chest radiograph from one day prior. FINDINGS: Right internal jugular Port-A-Cath terminates in the middle third of the SVC. Right shoulder hemiarthroplasty and partially visualized left shoulder total arthroplasty. Stable cardiomediastinal silhouette with mild cardiomegaly. No pneumothorax. No pleural effusion. Hazy streaky opacities in peripheral mid to lower left lung, similar. Largely resolved hazy right lung base opacities. IMPRESSION: 1. Largely resolved hazy right lung base opacities. 2. Stable hazy streaky opacities in the peripheral mid to lower left lung, favoring pneumonia. 3. Stable cardiomegaly. Electronically Signed   By: Ilona Sorrel M.D.   On: 10/30/2021 08:05   DG CHEST PORT 1 VIEW  Result Date: 10/29/2021 CLINICAL DATA:  76 year old male with history of shortness of breath. EXAM: PORTABLE CHEST 1 VIEW COMPARISON:  Chest x-ray 10/28/2021. FINDINGS: Right internal jugular single-lumen power porta cath with tip terminating in the mid superior vena cava. Lung volumes are slightly low. Diffuse peribronchial cuffing and widespread interstitial prominence, with patchy ill-defined peripheral predominant airspace disease, similar to the prior study. No pneumothorax. No suspicious appearing pulmonary nodules or masses are noted. Heart size is normal. Upper mediastinal contours are within  normal limits. Status post bilateral shoulder arthroplasties. IMPRESSION: 1. The appearance the chest again suggests multilobar bilateral bronchopneumonia, with similar aeration compared to the prior examination. 2. Postoperative changes and support apparatus, as above. Electronically Signed   By: Vinnie Langton M.D.   On: 10/29/2021 09:23   DG CHEST PORT 1 VIEW  Result Date: 10/28/2021 CLINICAL DATA:  Shortness of breath Evaluate for pneumonia EXAM: PORTABLE CHEST 1 VIEW COMPARISON:  10/26/2021 FINDINGS: Unchanged mild cardiomegaly. Right chest port is unchanged in position. Bilateral shoulder prostheses again seen. Mild pulmonary vascular congestion is unchanged. Peripheral lung opacities are not  significantly changed compared to prior CT exam. IMPRESSION: 1. Unchanged mild cardiomegaly. 2. Unchanged bilateral subpleural parenchymal opacities. Electronically Signed   By: Miachel Roux M.D.   On: 10/28/2021 13:42   DG Chest Port 1 View  Result Date: 10/26/2021 CLINICAL DATA:  Family found the patient on the floor, last seen yesterday after chemotherapy when patient complained of weakness. Patient does not remember falling. EXAM: PORTABLE CHEST 1 VIEW COMPARISON:  CT examination dated October 20, 2021 FINDINGS: The heart is enlarged. There are bilateral lower lobe opacities, left worse than the right concerning for pneumonia including aspiration pneumonia. Bilateral shoulder arthroplasty. Right IJ access MediPort with distal tip in the SVC. IMPRESSION: 1. Stable cardiomegaly. 2. Bilateral lower lobe opacities, left worse than the right concerning for pneumonia including aspiration pneumonia. Follow-up examination to resolution is recommended. Electronically Signed   By: Keane Police D.O.   On: 10/26/2021 09:35   DG Abd Portable 1V  Result Date: 11/23/2021 CLINICAL DATA:  Placement enteric tube EXAM: PORTABLE ABDOMEN - 1 VIEW COMPARISON:  11/04/2015 FINDINGS: Tip of enteric tube is seen in the stomach.  Bowel gas pattern in the upper abdomen is unremarkable. Heart is enlarged in size. There is prominence of pulmonary vessels along with interstitial and alveolar densities in both lungs, possibly suggesting pulmonary edema. Dextroscoliosis and degenerative changes are noted in lumbar spine. There is surgical fusion in the lumbar spine at L3-L4 level. IMPRESSION: Tip of enteric tube is seen in the stomach. Electronically Signed   By: Elmer Picker M.D.   On: 11/23/2021 13:38   CT CHEST ABDOMEN PELVIS WO CONTRAST  Result Date: 10/26/2021 CLINICAL DATA:  76 year old male presents for evaluation following fall. Patient also with history of esophageal neoplasm. EXAM: CT CHEST, ABDOMEN AND PELVIS WITHOUT CONTRAST TECHNIQUE: Multidetector CT imaging of the chest, abdomen and pelvis was performed following the standard protocol without IV contrast. RADIATION DOSE REDUCTION: This exam was performed according to the departmental dose-optimization program which includes automated exposure control, adjustment of the mA and/or kV according to patient size and/or use of iterative reconstruction technique. COMPARISON:  October 20, 2021. FINDINGS: CT CHEST FINDINGS Cardiovascular: The aorta shows smooth contours without adjacent stranding. No mediastinal hematoma. Aortic dilation to approximately 4 cm greatest axial dimension is unchanged. Generalized aortic atherosclerosis. Signs of mitral annular calcification. Three-vessel coronary artery disease. No substantial pericardial effusion, stable small pericardial effusion when compared to previous imaging from just 6 days ago RIGHT-sided Port-A-Cath terminates at the caval to atrial junction. Mediastinum/Nodes: Patulous esophagus similar to the prior exam. Mild thickening of the distal esophagus also unchanged in this patient with known history of esophageal neoplasm. No mediastinal hematoma or adenopathy. No axillary or thoracic inlet adenopathy. Lungs/Pleura: No  pneumothorax. Reticular opacities and nodular changes along the pleural surface with increasing conspicuity since previous imaging. Pleural based airspace disease with nodular features at the lung bases and along the anterior chest, these findings have developed since the previous exam of just 6 days ago. No pneumothorax. Material and the trachea and material layering in the mainstem bronchi extending into peripheral bronchi with signs of bronchial wall thickening. Musculoskeletal: See below for full musculoskeletal details. CT ABDOMEN PELVIS FINDINGS Hepatobiliary: Liver with smooth contours. Trace fluid adjacent to medial RIGHT hemi liver similar to previous imaging. No gross signs of hepatic trauma. No Peri hepatic fluid. No pericholecystic stranding. Pancreas: Pancreatic atrophy without signs of adjacent inflammation. Spleen: Trace stranding adjacent to the spleen. Minimal fluid adjacent to the spleen is  similar to the study of October 20, 2021. No gross splenic abnormality. Adrenals/Urinary Tract: Mild bilateral adrenal thickening. Perinephric stranding bilaterally perhaps slightly increased compared to previous imaging. Generalized increase in fascial thickening and edema albeit mild since the previous study. No hydronephrosis.  No perinephric hematoma. Stomach/Bowel: Small hiatal hernia and distal esophageal thickening as described. No signs of bowel dilation to suggest obstruction of the small bowel. The appendix is normal. Colon is stool filled without signs of adjacent stranding. No signs of pneumatosis. No pneumoperitoneum. No pelvic fluid. Vascular/Lymphatic: Aortic atherosclerosis. No sign of aneurysm. Smooth contour of the IVC. There is no gastrohepatic or hepatoduodenal ligament lymphadenopathy. No retroperitoneal or mesenteric lymphadenopathy. No pelvic sidewall lymphadenopathy. Atherosclerosis is moderate to marked. Vascular structures not well assessed given the lack of intravenous contrast. No  focal stranding or hematoma adjacent to the abdominal aorta or pelvic vessels. Reproductive: Unremarkable by CT. Urinary bladder is moderately distended extending to the sacral promontory. Other: No fluid in the pelvis. Trace fluid about the liver and spleen similar to recent comparison imaging. Musculoskeletal: No displaced rib fractures. Mildly limited assessment due to respiratory motion. Bony pelvis without signs of fracture. Extensive spinal degenerative changes and osteopenia. Post bilateral shoulder arthroplasty. Visualized clavicles and scapulae are intact. Signs of prior L3-L4 spinal fusion and cement augmentation at L4 showing no change. Spinal compression fractures with similar appearance at the T11-T12 level based on recent comparison imaging with approximally 30% loss of height at these levels. Spinal alignment is unchanged. Sternum grossly intact with some artifact crossing the anterior sternum due to motion. IMPRESSION: 1. No evidence of acute traumatic injury to the chest, abdomen or pelvis. 2. Peripheral nodular parenchymal consolidative changes with marked worsening since the recent comparison imaging potentially post/drug treatment related given pattern seen and rapid interval progression. Correlate with any respiratory symptoms. Findings are felt to have an overlay of acute inflammatory changes related to aspiration given findings in the bronchi. However, subpleural changes extend along the anterior chest which would be atypical for purely aspiration related changes. 3. Small hiatal hernia and distal esophageal thickening also unchanged in this patient with known history of esophageal neoplasm. 4. Mild generalized edema with perinephric stranding and fascial edema in the abdomen not substantially changed compared to recent imaging. 5. Unchanged appearance of T11-T12 compression fractures. 6. Aortic atherosclerosis. Three-vessel coronary artery disease. 7. Ascending thoracic aortic aneurysm.  Recommend annual imaging followup by CTA or MRA. This recommendation follows 2010 ACCF/AHA/AATS/ACR/ASA/SCA/SCAI/SIR/STS/SVM Guidelines for the Diagnosis and Management of Patients with Thoracic Aortic Disease. Circulation. 2010; 121: B284-X324. Aortic aneurysm NOS (ICD10-I71.9) Aortic Atherosclerosis (ICD10-I70.0). Electronically Signed   By: Zetta Bills M.D.   On: 10/26/2021 10:31    Assessment and Plan:  ORVAN PAPADAKIS 76 y.o. male with medical history significant for metastatic esophageal adenocarcinoma.  Now admitted to the hospital following a possible infusion reaction in our office.  Admitted for shock and acute respiratory failure with hypoxia.  Intubated earlier today.  #Shock -Presumed to be anaphylactic, but had fever up to 102.4 on admission so also could be sepsis. -5-FU was infusing at the time of his reaction and coronary vasospasm also possibility.  However, troponin is only mildly elevated. -Cultures pending. -On empiric antibiotics -Receiving empiric anaphylaxis treatment with Solu-Medrol has received Benadryl and Pepcid. -On pressors. -CT chest/abdomen/pelvis with concern for consolidation in both lobes and pulmonary edema.  #Acute respiratory failure with hypoxia, bilateral consolidation the lower lobes, pulmonary edema, OSA, COPD -Intubated earlier today due to respiratory  failure. -Status post bronchoscopy with cultures pending. -On Lasix  -Appreciate assistance from PCCM.  #Metastatic Adenocarcinoma of the Esophagus #Dysphagia -- Underwent palliative radiation in order to help alleviate his dysphagia and was able to tolerate p.o following radiation. --Unfortunately given the spread of this cancer to the skin we will need to treat this as metastatic disease with systemic therapy  -- Final testing on the tissue resulted with HER2 negative status, but TPS score of 60%.  Therefore he would be a good candidate for FOLFOX with nivolumab --Started Cycle 1 Day 1 of FOLFOX  plus Nivolumab on 04/26/2021 with good tolerance.  --Restaging CT Scan form 10/20/2021 shows stable soft tissue thickening of the esophagus. No evidence of progression.  -Developed a reaction during his infusion for cycle #12 in our office on 12/15/2021. -He has a low burden of disease and would recommend continuation of aggressive measures for now.  If he further decompensates or is not responding to current treatment, will need additional goals of care discussion   [name], DNP, AGPCNP-BC, AOCNP   I have read the above note and personally examined the patient. I agree with the assessment and plan as noted above.  Briefly Mr. Angus Amini is a 76 year old male who currently follows in our clinic for metastatic esophageal adenocarcinoma.  Yesterday he presented for cycle 12-day 1 of FOLFOX nivolumab and unfortunately during the course of his chemotherapy treatment began developing severe abdominal pain and severe nausea.  He doubled over and was in grave pain.  He received IV fluids and nausea medication without relief.  His oxygen sats dropped and he was transported to the emergency department for further evaluation and management.  The reason for his reaction in the chemotherapy room is unclear at this time.  The etiology of his current illness is uncertain.  At this time the emergency department is treating with broad-spectrum antibiotics, steroid therapy, and IV anticoagulation.  Troponins have been unremarkable.  EKG shows no clear ST elevations.  CT chest abdomen pelvis showed concern for fluid overload.  He is currently intubated and sedated in the ICU. I was able to speak with his wife at bedside.  Of note regarding the patient's cancer he has very low burden of disease and has a relatively good short-term prognosis given the limited progression of his cancer.  His cancer is incurable and chemotherapy has been doing a good job keeping and check in the interim.  His only sites of disease are in  the esophagus and a metastasis to the neck.  He does not have particularly heavy burden of disease.  As such I think it is reasonable to pursue all necessary supportive therapies at this time.  I would not recommend transition to comfort care until he has had ample opportunity to turn around his recent rapid change in his clinical condition.  His baseline functional status is relatively good though his mobility and functionality does suffer from chronic pain.  Oncology will continue to follow.  Thank you for the excellent care provided ICU unit.  Ledell Peoples, MD Department of Hematology/Oncology [place] [facility] Phone: [phone] Pager: [phone] Email: [name]

## 2021-11-23 NOTE — Discharge Planning (Addendum)
Oncology Discharge Planning Admission Note ENTERED IN ERROR

## 2021-11-23 NOTE — Progress Notes (Signed)
PCCM note ? ?Continue pressors ?Start bicarb drip due to persistent acidosis ?I called wife and updated her about his deterioration ?We discussed DNR status but she is not ready to make that decision yet ? ?Marshell Garfinkel MD ?[facility] ?See Amion for pager ? ?If no response to pager , please call [phone] until 7pm ?After 7:00 pm call Elink  [phone] ?11/23/2021, 6:46 PM  ?

## 2021-11-23 NOTE — Procedures (Signed)
Arterial Catheter Insertion Procedure Note ? ?Justin Trujillo.  ?[id number]  ?08/31/1946 ? ?Date:11/23/21  ?Time:5:18 PM  ? ? ?Provider Performing: Elsie Stain  ? ? ?Procedure: Insertion of Arterial Line [phone]) with US guidance (21308)  ? ?Indication(s) ?Blood pressure monitoring and/or need for frequent ABGs ? ?Consent ?Risks of the procedure as well as the alternatives and risks of each were explained to the patient and/or caregiver.  Consent for the procedure was obtained and is signed in the bedside chart ? ?Anesthesia ?None ? ? ?Time Out ?Verified patient identification, verified procedure, site/side was marked, verified correct patient position, special equipment/implants available, medications/allergies/relevant history reviewed, required imaging and test results available. ? ? ?Sterile Technique ?Maximal sterile technique including full sterile barrier drape, hand hygiene, sterile gown, sterile gloves, mask, hair covering, sterile ultrasound probe cover (if used). ? ? ?Procedure Description ?Area of catheter insertion was cleaned with chlorhexidine and draped in sterile fashion. With real-time ultrasound guidance an arterial catheter was placed into the right femoral artery.  Appropriate arterial tracings confirmed on monitor.   ? ? ?Complications/Tolerance ?None; patient tolerated the procedure well. ? ? ?EBL ?Minimal ? ? ?Specimen(s) ?None ? ?

## 2021-11-23 NOTE — Progress Notes (Addendum)
ANTICOAGULATION CONSULT NOTE  ? ?Pharmacy Consult for heparin ?Indication: atrial fibrillation ? ?Allergies  ?Allergen Reactions  ? Iodinated Contrast Media Anaphylaxis  ? Moxifloxacin Swelling  ?  REACTION: GI upset, throat "tightened up"  ? Penicillins Anaphylaxis  ?  Remote anaphylaxis reaction (not childhood) requiring MD/hospital care ?- tolerates Ceftin, Rocephin, Cefepime ?  ? Shellfish-Derived Products Anaphylaxis  ? Amoxicillin Swelling  ?  Throat tightened up ?Tolerates Ceftin, Rocephin, Cefepime  ? Chlorhexidine Other (See Comments)  ?  Wife is not aware of this allergy  ? Latex Rash  ?  Reaction to prolonged exposure  ? ? ?Patient Measurements: ?Height: '5\' 6"'$  (167.6 cm) ?Weight: 71 kg (156 lb 8.4 oz) ?IBW/kg (Calculated) : 63.8 ?Heparin Dosing Weight: 70.8 kg ? ?Vital Signs: ?Temp: 102 ?F (38.9 ?C) (03/09 0800) ?Temp Source: Axillary (03/09 0800) ?BP: 107/62 (03/09 0759) ?Pulse Rate: 119 (03/09 0759) ? ?Labs: ?Recent Labs  ?  11/25/2021 ?0753 11/16/2021 ?1456 12/05/2021 ?1721 12/09/2021 ?1742 12/02/2021 ?2034 11/23/21 ?0237  ?HGB 9.9* 12.2*  --   --   --  11.8*  ?HCT 29.2* 38.1*  --   --   --  37.3*  ?PLT 224 257  --   --   --  237  ?APTT  --   --   --  28  --  86*  ?LABPROT  --   --   --  16.4*  --   --   ?INR  --   --   --  1.3*  --   --   ?HEPARINUNFRC  --   --   --  0.96*  --  1.04*  ?CREATININE 0.57* 0.66  --   --   --  0.87  ?TROPONINIHS  --   --  75*  --  27*  --   ? ? ? ?Estimated Creatinine Clearance: 65.2 mL/min (by C-G formula based on SCr of 0.87 mg/dL). ? ?Assessment: ?76 year old male presented to the ED from cancer center with possible anaphylactic reaction to chemotherapy infusion. He required non-rebreather and vasopressor support. He is to be admitted to the ICU. Patient with history of atrial fibrillation on Eliquis PTA. Pharmacy consulted to manage heparin. Given patient acuity on presentation to ED, suspect last dose of Eliquis 3/8 morning prior to appointment at cancer  center. ? ?11/23/21 ?Heparin level = 1.04 (remains falsely elevated due to PTA apixaban, dose heparin using aptt until correlation with heparin levels) ?aPTT increased to 152 sec (supra-therapeutic) on heparin 1000 units/hr ?CBC: Hgb 11.8, Pltc wnl ?No complications of therapy noted ?Intubated on 3/9 AM, no interruptions or pauses noted.  No bleeding or complications.   ? ?Goal of Therapy:  ?Heparin level 0.3-0.7 units/ml ?aPTT 66-102 seconds ?Monitor platelets by anticoagulation protocol: Yes ?  ?Plan:  ?- HOLD heparin for 1 hour ?- Decrease to heparin infusion at 800 units/hr ?- Recheck aPTT in 8 hours ?- CBC & heparin level daily while on heparin infusion ? ? ?Gretta Arab PharmD, BCPS ?Clinical Pharmacist ?[facility] [phone] ?11/23/2021 11:40 AM ? ? ? ? ?

## 2021-11-23 NOTE — Progress Notes (Addendum)
eLink Physician-Brief Progress Note ?Patient Name: Justin Trujillo. ?DOB: [birth year] ?MRN: [id number] ? ? ?Date of Service ? 11/23/2021  ?HPI/Events of Note ? Nursing concerned about SOB and wheezing. Sat = 94% and RR = 24.  ?eICU Interventions ? Plan: ?Place patient on BiPAP. ?Albuterol 2.5 mg via neb now and Q 3 hours PRN SOB or wheezing.  ?Portable CXR STAT.  ? ? ? ?Intervention Category ?Major Interventions: Other: ? ?Bailee Metter Cornelia Copa ?11/23/2021, 2:15 AM ?

## 2021-11-23 NOTE — Procedures (Signed)
Arterial Catheter Insertion Procedure Note ? ?Justin Trujillo.  ?[id number]  ?1946/08/09 ? ?Date:11/23/21  ?Time:5:55 PM  ? ? ?Provider Performing: Rodriquez Thorner  ? ? ?Procedure: Insertion of Arterial Line [phone]) with US guidance (93716)  ? ?Indication(s) ?Blood pressure monitoring and/or need for frequent ABGs ? ?Consent ?Unable to obtain consent due to emergent nature of procedure. ? ?Anesthesia ?None ? ? ?Time Out ?Verified patient identification, verified procedure, site/side was marked, verified correct patient position, special equipment/implants available, medications/allergies/relevant history reviewed, required imaging and test results available. ? ? ?Sterile Technique ?Maximal sterile technique including full sterile barrier drape, hand hygiene, sterile gown, sterile gloves, mask, hair covering, sterile ultrasound probe cover (if used). ? ? ?Procedure Description ?Area of catheter insertion was cleaned with chlorhexidine and draped in sterile fashion. With real-time ultrasound guidance an arterial catheter was placed into the right femoral artery.  Appropriate arterial tracings confirmed on monitor.   ? ? ?Complications/Tolerance ?None; patient tolerated the procedure well. ? ? ?EBL ?Minimal ? ? ?Specimen(s) ?None ? ?Justin Garfinkel MD ?[facility] ?See Amion for pager ? ?If no response to pager , please call [phone] until 7pm ?After 7:00 pm call Elink  [phone] ?11/23/2021, 5:56 PM  ?

## 2021-11-23 NOTE — Progress Notes (Signed)
eLink Physician-Brief Progress Note ?Patient Name: Justin Trujillo. ?DOB: [birth year] ?MRN: [id number] ? ? ?Date of Service ? 11/23/2021  ?HPI/Events of Note ? Patient not able to sleep and requests sleep aid. Patient NPO. No gastric tube.   ?eICU Interventions ? Plan: ?Benadryl 25 mg IV X 1 now.   ? ? ? ?Intervention Category ?Major Interventions: Other: ? ?Emerly Prak Cornelia Copa ?11/23/2021, 1:50 AM ?

## 2021-11-23 NOTE — Progress Notes (Addendum)
Interval PCCM Progress Note ? ? ?ABG results reviewed - poor P/f ratio.  Sats reading in 80's on monitor.   Assessed at bedside.  Patient remains on 100% fiO2, 12 PEEP.  On 35 mcg levophed, vasopressin with ongoing hypotension. Patient has bilateral breath sounds, no bronchospasm.  Peak 33, Pplat 29, DP 15.  Bedside ECHO per MD with largely normal EF. Concern for decline despite aggressive level of support.  Wife, Leda Gauze, called for update. No answer, message left for return call.  ? ?Shock ?Acute Hypoxic Respiratory Failure, ARDS ?NGMA ? ? ?Plan: ?-place emergent central access per MD ?-2 amps bicarbonate now ?-stop heparin infusion  ?-place emergent aline per MD ?-assess BMP, lactic acid, SvO2 once central access in place.  ?-need to discuss goals of care with wife on return call ? ? ?Additional CC Time: 30 minutes  ? ? ?Noe Gens, MSN, APRN, NP-C, AGACNP-BC ?[facility] ?11/23/2021, 4:59 PM ? ? ?Please see [email or web address] for pager details.  ? ?From 7A-7P if no response, please call [phone] ?After hours, please call ELink [phone] ? ?

## 2021-11-23 NOTE — Procedures (Signed)
Intubation Procedure Note ? ?Deron Sharmaine Base.  ?[id number]  ?[birth year] ? ?Date:11/23/21  ?Time:10:12 AM  ? ?Provider Performing:Tobi Leinweber  ? ? ?Procedure: Intubation (89211) ? ?Indication(s) ?Respiratory Failure ? ?Consent ?Risks of the procedure as well as the alternatives and risks of each were explained to the patient and/or caregiver.  Consent for the procedure was obtained and is signed in the bedside chart ? ? ?Anesthesia ?Etomidate, Versed, Fentanyl, and Rocuronium ? ? ?Time Out ?Verified patient identification, verified procedure, site/side was marked, verified correct patient position, special equipment/implants available, medications/allergies/relevant history reviewed, required imaging and test results available. ? ? ?Sterile Technique ?Usual hand hygeine, masks, and gloves were used ? ? ?Procedure Description ?Patient positioned in bed supine.  Sedation given as noted above.  Patient was intubated with endotracheal tube using Glidescope.  View was Grade 1 full glottis .  Number of attempts was 1.  Colorimetric CO2 detector was consistent with tracheal placement. ? ? ?Complications/Tolerance ?None; patient tolerated the procedure well. ?Chest X-ray is ordered to verify placement. ? ? ?EBL ?Minimal ? ? ?Specimen(s) ?None ? ?Marshell Garfinkel MD ?[facility] ?See Amion for pager ? ?If no response to pager , please call [phone] until 7pm ?After 7:00 pm call Elink  [phone] ?11/23/2021, 10:12 AM  ? ?

## 2021-11-23 NOTE — Progress Notes (Signed)
PHARMACY NOTE:  ANTIMICROBIAL RENAL DOSAGE ADJUSTMENT ? ?Current antimicrobial regimen includes a mismatch between antimicrobial dosage and estimated renal function.  As per policy approved by the Pharmacy & Therapeutics and Medical Executive Committees, the antimicrobial dosage will be adjusted accordingly. ? ?Current antimicrobial dosage:  vancomycin 1500 mg q24h + cefepime 2 g q8h ? ?Indication: sepsis ? ?Renal Function: ? ?Estimated Creatinine Clearance: 54.5 mL/min (by C-G formula based on SCr of 1.04 mg/dL). ? ?   ?Antimicrobial dosage has been changed to:  vancomycin 1250 mg q24h + cefepime 2 g q12h ? ?Additional comments: Patient with worsening creatinine, requiring increasing vasopressor support. Antibiotic doses adjusted. Will follow renal function closely for further adjustments as indicated. ? ? ?Thank you for allowing pharmacy to be a part of this patient's care. ? ?Tawnya Crook, PharmD, BCPS ?Clinical Pharmacist ?11/23/2021 7:13 PM ? ?

## 2021-11-23 NOTE — Progress Notes (Signed)
eLink Physician-Brief Progress Note ?Patient Name: Justin Trujillo. ?DOB: [birth year] ?MRN: [id number] ? ? ?Date of Service ? 11/23/2021  ?HPI/Events of Note ? ABG on BiPAP = 7.35/32/69/17.7.  ?eICU Interventions ? Plan: ?Lasix 40 mg IV X 1 now. ?PCCM day rounder updated about patient's clinical deterioration.   ? ? ? ?Intervention Category ?Major Interventions: Hypoxemia - evaluation and management ? ?[name] ?11/23/2021, 6:52 AM ?

## 2021-11-23 NOTE — Procedures (Signed)
Bronchoscopy Procedure Note ? ?Lorcan Sharmaine Base.  ?[id number]  ?March 16, 1946 ? ?Date:11/23/21  ?Time:10:13 AM  ? ?Provider Performing:Shanetta Nicolls  ? ?Procedure(s):  Flexible bronchoscopy with bronchial alveolar lavage (24580) ? ?Indication(s) ?Acute respiratory failure, Pneumonia ? ?Consent ?Risks of the procedure as well as the alternatives and risks of each were explained to the patient and/or caregiver.  Consent for the procedure was obtained and is signed in the bedside chart ? ?Anesthesia ?Etomidate ? ? ?Time Out ?Verified patient identification, verified procedure, site/side was marked, verified correct patient position, special equipment/implants available, medications/allergies/relevant history reviewed, required imaging and test results available. ? ? ?Sterile Technique ?Usual hand hygiene, masks, gowns, and gloves were used ? ? ?Procedure Description ?Bronchoscope advanced through endotracheal tube and into airway.  Airways were examined down to subsegmental level with findings noted below.   ?Following diagnostic evaluation, BAL(s) performed in right middle and right lower lobes with normal saline and return of mucopurulent fluid ? ?Findings: Normal airways and anatomy. Mucopurulent return on BAL ? ? ?Complications/Tolerance ?None; patient tolerated the procedure well. ?Chest X-ray is needed post procedure. ? ? ?EBL ?Minimal ? ? ?Specimen(s) ?BAL of RML and RLL ? ?Marshell Garfinkel MD ?[facility] ?See Amion for pager ? ?If no response to pager , please call [phone] until 7pm ?After 7:00 pm call Elink  [phone] ?11/23/2021, 10:18 AM  ? ?

## 2021-11-23 NOTE — Progress Notes (Signed)
? ?NAME:  Justin Closs., MRN:  [id number], DOB:  1946/07/25, LOS: 1 ?ADMISSION DATE:  12/14/2021, CONSULTATION DATE:  12/11/2021 ?REFERRING MD: Andrey Campanile, MD, CHIEF COMPLAINT: Anaphylactic shock ? ?History of Present Illness:  ?76 year old with history of metastatic adenocarcinoma of distal esophagus, hypertension, COPD, OSA.  Diagnosed with esophageal cancer in June 2022 on treatment with FOLFOX and nivolumab.  Brought to the [place] long ED from the cancer center after he received his infusions with abdominal pain, hypotension, hypoxia and dyspnea.  Felt to have an anaphylactic reaction from his infusion and given IV fluids, epinephrine injection, Pepcid and IV Solu-Medrol 125 mg.  Remains hypotensive and PCCM called for admission.  Hypoxic requiring intermittent BiPAP.  ? ?Pertinent  Medical History  ?Esophageal Cancer - dx in 02/2021, on Folfox + nivolumab.   ?CAD ?HTN  ?HLD  ?COPD  ?OSA ?DM II  ? ?Significant Hospital Events: ?Including procedures, antibiotic start and stop dates in addition to other pertinent events   ?3/8 Admit post suspected anaphylaxis with chemo.  COVID/Flu negative.  ?3/9 On BiPAP, 35mg norepi, heparin infusion  ? ?Interim History / Subjective:  ?Tmax 99.8 (on admit 102.4), WBC 28.4  ?On BiPAP, 70% O2 ?Glucose range 140-167  ?I/O 700 ml UOP, +2L in last 24 hours  ?On 954m norepi (initially requiring 1443m ? ?Objective   ?Blood pressure 103/76, pulse (!) 119, temperature 99.8 ?F (37.7 ?C), temperature source Axillary, resp. rate (!) 35, height '5\' 6"'$  (1.676 m), weight 71 kg, SpO2 (!) 84 %. ?   ?   ? ?Intake/Output Summary (Last 24 hours) at 11/23/2021 0721 ?Last data filed at 11/23/2021 [id number] per 24 hour  ?Intake 2703.46 ml  ?Output 700 ml  ?Net 2003.46 ml  ? ?Filed Weights  ? 11/17/2021 1830 11/23/21 0500  ?Weight: 71 kg 71 kg  ? ? ?Examination: ?General: elderly adult male lying in bed in NAD  ?HEENT: MM pink/dry, BiPAP mask in place, anicteric  ?Neuro: Awake, alert, picking at bed  linens & face mask, MAE with normal strength  ?CV: s1s2 irr irr, AF on monitor, no m/r/g ?PULM: tachypnea but non-labored at rest, lungs bilaterally with coarse crackles R>L ?GI: soft, bsx4 active  ?Extremities: warm/dry   ?Skin: thin skin, arms with multiple areas ecchymosis  ? ?Resolved Hospital Problem list   ? ? ?Assessment & Plan:  ? ?Shock ?Presumed to be anaphylaxis, though he has received FOLFOX and nivolumab many times before 3/8 admit.  Had fever to 102.4, must also consider septic given immune compromise and port in place.  ?-wean levophed for MAP >65  ?-assess cortisol to rule out relative adrenal insufficiency  ?-follow blood cultures ?-assess UA (not collected) ?-continue cefepime, vanco ?-continue empiric anaphylaxis treatment > solumedrol, s/p benadryl + pepcid  ?-PCT elevated in setting of immune compromise / unclear significance  ?-CT chest, abd/pelvis with concern for consolidation in lower lobes + pulmonary edema  ? ?Acute Respiratory Failure with Hypoxia ?Bilateral Consolidation in Lower Lobes, Pulmonary Edema (on CT) ?OSA (not on CPAP) ?COPD  ?-bipap support now and PRN  ?-at risk intubation  ?-if intubated, consider FOB for tracheal aspirate  ?-wean O2 for sats >90% ?-lasix ordered per overnight MD, reasonable given imaging findings.  Treat through hypotension with levo ?-PRN albuterol  ? ?Paroxysmal Atrial Fibrillation ?-heparin infusion per pharmacy  ?-hold home eliquis, PO amiodarone ? ?DM II  ?-hold home regimen  ?-glucose range well controlled currently ?-SSI, moderate scale  ? ?Hyponatremia  ?-follow Na+  trend  ? ?Moderate Protein Calorie Malnutrition (POA) ?Albumin 2-2.9 ?-NPO for now ?-may need supplemental TF via cortrak in short term if not able to eat in near future ? ?Best Practice (right click and "Reselect all SmartList Selections" daily)  ?Diet/type: NPO ?DVT prophylaxis: systemic heparin ?GI prophylaxis: H2B for 3/9, none thereafter ?Lines: N/A ?Foley:  N/A ?Code Status:  full  code ?Last date of multidisciplinary goals of care discussion.  12/05/2021.  Discussed goals of care with wife at bedside.  She wants full code for now with time-limited trial of intubation and support.  ? ?Critical care time: 34 minutes   ? ?Noe Gens, MSN, APRN, NP-C, AGACNP-BC ?[facility] ?11/23/2021, 7:21 AM ? ? ?Please see [email or web address] for pager details.  ? ?From 7A-7P if no response, please call [phone] ?After hours, please call Warren Lacy [phone] ? ? ? ?  ?

## 2021-11-23 NOTE — Progress Notes (Signed)
eLink Physician-Brief Progress Note ?Patient Name: Justin Trujillo. ?DOB: 1945/12/24 ?MRN: [id number] ? ? ?Date of Service ? 11/23/2021  ?HPI/Events of Note ? Patient more confused.   ?eICU Interventions ? Plan: ?ABG STAT.  ? ? ? ?Intervention Category ?Major Interventions: Delirium, psychosis, severe agitation - evaluation and management ? ?Loriel Diehl Cornelia Copa ?11/23/2021, 6:17 AM ?

## 2021-11-23 NOTE — Progress Notes (Signed)
ANTICOAGULATION CONSULT NOTE  ? ?Pharmacy Consult for heparin ?Indication: atrial fibrillation ? ?Allergies  ?Allergen Reactions  ? Iodinated Contrast Media Anaphylaxis  ? Moxifloxacin Swelling  ?  REACTION: GI upset, throat "tightened up"  ? Penicillins Anaphylaxis  ?  Remote anaphylaxis reaction (not childhood) requiring MD/hospital care ?- tolerates Ceftin, Rocephin, Cefepime ?  ? Shellfish-Derived Products Anaphylaxis  ? Amoxicillin Swelling  ?  Throat tightened up ?Tolerates Ceftin, Rocephin, Cefepime  ? Chlorhexidine Other (See Comments)  ?  Wife is not aware of this allergy  ? Latex Rash  ?  Reaction to prolonged exposure  ? ? ?Patient Measurements: ?Height: '5\' 6"'$  (167.6 cm) ?Weight: 71 kg (156 lb 8.4 oz) ?IBW/kg (Calculated) : 63.8 ?Heparin Dosing Weight: 70.8 kg ? ?Vital Signs: ?Temp: 99.8 ?F (37.7 ?C) (03/09 0000) ?Temp Source: Axillary (03/09 0000) ?BP: 123/61 (03/09 0325) ?Pulse Rate: 121 (03/09 0325) ? ?Labs: ?Recent Labs  ?  12/13/2021 ?0753 11/17/2021 ?1456 12/08/2021 ?1721 12/02/2021 ?1742 11/28/2021 ?2034 11/23/21 ?0237  ?HGB 9.9* 12.2*  --   --   --  11.8*  ?HCT 29.2* 38.1*  --   --   --  37.3*  ?PLT 224 257  --   --   --  237  ?APTT  --   --   --  28  --  86*  ?LABPROT  --   --   --  16.4*  --   --   ?INR  --   --   --  1.3*  --   --   ?HEPARINUNFRC  --   --   --  0.96*  --  1.04*  ?CREATININE 0.57* 0.66  --   --   --   --   ?TROPONINIHS  --   --  19*  --  27*  --   ? ? ? ?Estimated Creatinine Clearance: 70.9 mL/min (by C-G formula based on SCr of 0.66 mg/dL). ? ? ?Medical History: ?Past Medical History:  ?Diagnosis Date  ? Arthritis   ? [facility])   ? Coronary atherosclerosis of native coronary artery   ? Depression   ? Esophageal cancer ([place])   ? Essential tremor   ? High cholesterol   ? Hypertension   ? Hypertension   ? Hypogonadism male   ? Obstructive chronic bronchitis with exacerbation ([place])   ? Obstructive sleep apnea (adult) (pediatric)   ? no cpap  ? Other and unspecified hyperlipidemia   ?  Other emphysema ([place])   ? Proteinuria 2019  ? has seen a nephrologist  ? Shortness of breath   ? with excertion  ? Situational stress   ? Type II or unspecified type diabetes mellitus without mention of complication, not stated as uncontrolled   ? type 2  ? Unspecified essential hypertension   ? ? ? ? ?Assessment: ?76 year old male presented to the ED from cancer center with possible anaphylactic reaction to chemotherapy infusion. He required non-rebreather and vasopressor support. He is to be admitted to the ICU. Patient with history of atrial fibrillation on Eliquis PTA. Pharmacy consulted to manage heparin. Given patient acuity on presentation to ED, suspect last dose of Eliquis 3/8 morning prior to appointment at cancer center. ? ?11/23/21 ?Heparin level = 1.04 (remains falsely elevated due to lingering effects of Eliquis PTA) ?aPTT = 86 sec (therapeutic) with heparin gtt @ 1000 units/hr ?CBC: Hgb 11.8, Pltc wnl ?No complications of therapy noted ?- Follow aPTT given Eliquis PTA until correlation with heparin  level, then dose per heparin levels ? ?Goal of Therapy:  ?Heparin level 0.3-0.7 units/ml ?aPTT 66-102 seconds ?Monitor platelets by anticoagulation protocol: Yes ?  ?Plan:  ?- Continue heparin infusion at 1000 units/hr ?- Recheck aPTT in 8 hours to confirm therapeutic dose ?- CBC & heparin level daily while on heparin infusion ? ?Everette Rank, PharmD ?11/23/2021 4:01 AM ? ? ? ?

## 2021-11-23 NOTE — Progress Notes (Signed)
eLink Physician-Brief Progress Note ?Patient Name: Justin Trujillo. ?DOB: 1946-05-01 ?MRN: [id number] ? ? ?Date of Service ? 11/23/2021  ?HPI/Events of Note ? Review of CXR reveals: ?1. Interval worsening in extensive bilateral airspace disease ?involving primarily the mid and lower lungs, likely reflecting ?worsened pulmonary edema. ?2. Superimposed small to moderate bilateral pleural effusions, also ?progressed and worsened. Findings suggest progressive CHF  ?eICU Interventions ? Plan: ?Lasix 20 mg IV X 1 now.  ? ? ? ?Intervention Category ?Major Interventions: Other: ? ?Budd Freiermuth Cornelia Copa ?11/23/2021, 3:30 AM ?

## 2021-11-23 NOTE — Procedures (Signed)
Central Venous Catheter Insertion Procedure Note ? ?Chloe Sharmaine Base.  ?[id number]  ?[birth year] ? ?Date:11/23/21  ?Time:5:55 PM  ? ?Provider Performing:Terree Gaultney  ? ?Procedure: Insertion of Non-tunneled Central Venous Catheter(36556) with US guidance (53794)  ? ?Indication(s) ?Medication administration and Difficult access ? ?Consent ?Unable to obtain consent due to emergent nature of procedure. ? ?Anesthesia ?Topical only with 1% lidocaine  ? ?Timeout ?Verified patient identification, verified procedure, site/side was marked, verified correct patient position, special equipment/implants available, medications/allergies/relevant history reviewed, required imaging and test results available. ? ?Sterile Technique ?Maximal sterile technique including full sterile barrier drape, hand hygiene, sterile gown, sterile gloves, mask, hair covering, sterile ultrasound probe cover (if used). ? ?Procedure Description ?Area of catheter insertion was cleaned with chlorhexidine and draped in sterile fashion.  With real-time ultrasound guidance a central venous catheter was placed into the left internal jugular vein. Nonpulsatile blood flow and easy flushing noted in all ports.  The catheter was sutured in place and sterile dressing applied. ? ?Complications/Tolerance ?None; patient tolerated the procedure well. ?Chest X-ray is ordered to verify placement for internal jugular or subclavian cannulation.   Chest x-ray is not ordered for femoral cannulation. ? ?EBL ?Minimal ? ?Specimen(s) ?None ? ?Marshell Garfinkel MD ?[facility] ?See Amion for pager ? ?If no response to pager , please call [phone] until 7pm ?After 7:00 pm call Elink  [phone] ?11/23/2021, 5:55 PM  ?

## 2021-11-23 NOTE — Progress Notes (Signed)
?  Transition of Care (TOC) Screening Note ? ? ?Patient Details  ?Name: Justin Trujillo. ?Date of Birth: [birth year] ? ? ?Transition of Care (TOC) CM/SW Contact:    ?Mosi Hannold, LCSW ?Phone Number: ?11/23/2021, 11:02 AM ? ? ? ?Transition of Care Department [facility]) has reviewed patient and no TOC needs have been identified at this time. We will continue to monitor patient advancement through interdisciplinary progression rounds. If new patient transition needs arise, please place a TOC consult. ? ? ?

## 2021-11-24 ENCOUNTER — Inpatient Hospital Stay (HOSPITAL_COMMUNITY): Payer: PPO

## 2021-11-24 ENCOUNTER — Inpatient Hospital Stay: Payer: PPO

## 2021-11-24 DIAGNOSIS — J80 Acute respiratory distress syndrome: Secondary | ICD-10-CM

## 2021-11-24 DIAGNOSIS — Z515 Encounter for palliative care: Secondary | ICD-10-CM

## 2021-11-24 DIAGNOSIS — C159 Malignant neoplasm of esophagus, unspecified: Secondary | ICD-10-CM

## 2021-11-24 DIAGNOSIS — J9601 Acute respiratory failure with hypoxia: Secondary | ICD-10-CM

## 2021-11-24 DIAGNOSIS — Z7189 Other specified counseling: Secondary | ICD-10-CM

## 2021-11-24 DIAGNOSIS — R579 Shock, unspecified: Secondary | ICD-10-CM | POA: Diagnosis not present

## 2021-11-24 LAB — URINE CULTURE: Culture: <10000 — AB

## 2021-11-24 LAB — COMPREHENSIVE METABOLIC PANEL
ALT: 22 U/L (ref 0–44)
AST: 53 U/L — ABNORMAL HIGH (ref 15–41)
Albumin: 1.8 g/dL — ABNORMAL LOW (ref 3.5–5.0)
Alkaline Phosphatase: 98 U/L (ref 38–126)
Anion gap: 9 (ref 5–15)
BUN: 34 mg/dL — ABNORMAL HIGH (ref 8–23)
CO2: 22 mmol/L (ref 22–32)
Calcium: 6.8 mg/dL — ABNORMAL LOW (ref 8.9–10.3)
Chloride: 97 mmol/L — ABNORMAL LOW (ref 98–111)
Creatinine, Ser: 1.18 mg/dL (ref 0.61–1.24)
GFR, Estimated: >60 mL/min (ref >60)
Glucose, Bld: 243 mg/dL — ABNORMAL HIGH (ref 70–99)
Potassium: 4.2 mmol/L (ref 3.5–5.1)
Sodium: 128 mmol/L — ABNORMAL LOW (ref 135–145)
Total Bilirubin: 0.5 mg/dL (ref 0.3–1.2)
Total Protein: 4.7 g/dL — ABNORMAL LOW (ref 6.5–8.1)

## 2021-11-24 LAB — BLOOD GAS, ARTERIAL
Acid-Base Excess: 1.5 mmol/L (ref 0.0–2.0)
Acid-Base Excess: 5.4 mmol/L — ABNORMAL HIGH (ref 0.0–2.0)
Acid-Base Excess: 4.1 mmol/L — ABNORMAL HIGH (ref 0.0–2.0)
Acid-base deficit: 4.3 mmol/L — ABNORMAL HIGH (ref 0.0–2.0)
Bicarbonate: 21.6 mmol/L (ref 20.0–28.0)
Bicarbonate: 26.6 mmol/L (ref 20.0–28.0)
Bicarbonate: 29.9 mmol/L — ABNORMAL HIGH (ref 20.0–28.0)
Bicarbonate: 29.2 mmol/L — ABNORMAL HIGH (ref 20.0–28.0)
Drawn by: 5017
FIO2: 90 %
FIO2: 90 %
MECHVT: 380 mL
MECHVT: 380 mL
O2 Saturation: 93.4 %
O2 Saturation: 93.7 %
O2 Saturation: 97.1 %
O2 Saturation: 100 %
PEEP: 12 cmH2O
PEEP: 12 cmH2O
Patient temperature: 37
Patient temperature: 37
Patient temperature: 37
Patient temperature: 37
RATE: 28 resp/min
RATE: 28 {breaths}/min
pCO2 arterial: 42 mmHg (ref 32–48)
pCO2 arterial: 43 mmHg (ref 32–48)
pCO2 arterial: 42 mmHg (ref 32–48)
pCO2 arterial: 45 mmHg (ref 32–48)
pH, Arterial: 7.32 — ABNORMAL LOW (ref 7.35–7.45)
pH, Arterial: 7.4 (ref 7.35–7.45)
pH, Arterial: 7.46 — ABNORMAL HIGH (ref 7.35–7.45)
pH, Arterial: 7.42 (ref 7.35–7.45)
pO2, Arterial: 74 mmHg — ABNORMAL LOW (ref 83–108)
pO2, Arterial: 66 mmHg — ABNORMAL LOW (ref 83–108)
pO2, Arterial: 68 mmHg — ABNORMAL LOW (ref 83–108)
pO2, Arterial: 130 mmHg — ABNORMAL HIGH (ref 83–108)

## 2021-11-24 LAB — CBC
HCT: 33.4 % — ABNORMAL LOW (ref 39.0–52.0)
Hemoglobin: 11 g/dL — ABNORMAL LOW (ref 13.0–17.0)
MCH: 32.3 pg (ref 26.0–34.0)
MCHC: 32.9 g/dL (ref 30.0–36.0)
MCV: 97.9 fL (ref 80.0–100.0)
Platelets: 202 10*3/uL (ref 150–400)
RBC: 3.41 MIL/uL — ABNORMAL LOW (ref 4.22–5.81)
RDW: 15.9 % — ABNORMAL HIGH (ref 11.5–15.5)
WBC: 29.6 10*3/uL — ABNORMAL HIGH (ref 4.0–10.5)
nRBC: 0 % (ref 0.0–0.2)

## 2021-11-24 LAB — MAGNESIUM
Magnesium: 2.4 mg/dL (ref 1.7–2.4)
Magnesium: 2.5 mg/dL — ABNORMAL HIGH (ref 1.7–2.4)

## 2021-11-24 LAB — GLUCOSE, CAPILLARY
Glucose-Capillary: 263 mg/dL — ABNORMAL HIGH (ref 70–99)
Glucose-Capillary: 200 mg/dL — ABNORMAL HIGH (ref 70–99)
Glucose-Capillary: 191 mg/dL — ABNORMAL HIGH (ref 70–99)
Glucose-Capillary: 188 mg/dL — ABNORMAL HIGH (ref 70–99)
Glucose-Capillary: 191 mg/dL — ABNORMAL HIGH (ref 70–99)
Glucose-Capillary: 193 mg/dL — ABNORMAL HIGH (ref 70–99)
Glucose-Capillary: 168 mg/dL — ABNORMAL HIGH (ref 70–99)
Glucose-Capillary: 90 mg/dL (ref 70–99)

## 2021-11-24 LAB — ACID FAST SMEAR (AFB, MYCOBACTERIA)
Acid Fast Smear: NEGATIVE
Acid Fast Smear: NEGATIVE

## 2021-11-24 LAB — PNEUMOCYSTIS JIROVECI SMEAR BY DFA: Pneumocystis jiroveci Ag: NEGATIVE

## 2021-11-24 LAB — CYTOLOGY - NON PAP

## 2021-11-24 LAB — SEDIMENTATION RATE: Sed Rate: 31 mm/hr — ABNORMAL HIGH (ref 0–16)

## 2021-11-24 LAB — TRIGLYCERIDES: Triglycerides: 125 mg/dL (ref <150)

## 2021-11-24 LAB — PROCALCITONIN: Procalcitonin: 39.71 ng/mL

## 2021-11-24 LAB — HEPARIN LEVEL (UNFRACTIONATED)
Heparin Unfractionated: 0.81 IU/mL — ABNORMAL HIGH (ref 0.30–0.70)
Heparin Unfractionated: 0.45 IU/mL (ref 0.30–0.70)

## 2021-11-24 LAB — PHOSPHORUS
Phosphorus: 5.2 mg/dL — ABNORMAL HIGH (ref 2.5–4.6)
Phosphorus: 3.7 mg/dL (ref 2.5–4.6)

## 2021-11-24 LAB — LACTIC ACID, PLASMA
Lactic Acid, Venous: 2.8 mmol/L (ref 0.5–1.9)
Lactic Acid, Venous: 2.8 mmol/L (ref 0.5–1.9)

## 2021-11-24 LAB — APTT
aPTT: 139 seconds — ABNORMAL HIGH (ref 24–36)
aPTT: 90 seconds — ABNORMAL HIGH (ref 24–36)
aPTT: 64 seconds — ABNORMAL HIGH (ref 24–36)

## 2021-11-24 MED ORDER — STERILE WATER FOR INJECTION IV SOLN
INTRAVENOUS | Status: DC
  Filled 2021-11-24 (×2): qty 150
  Filled 2021-11-24: qty 1000

## 2021-11-24 MED ORDER — GERHARDT'S BUTT CREAM
TOPICAL_CREAM | Freq: Two times a day (BID) | CUTANEOUS | Status: DC
  Administered 2021-11-25 – 2021-11-27 (×4): 1 via TOPICAL
  Filled 2021-11-24: qty 1

## 2021-11-24 MED ORDER — INSULIN ASPART 100 UNIT/ML IJ SOLN
0.0000 [IU] | INTRAMUSCULAR | Status: DC
  Administered 2021-11-24 – 2021-11-25 (×5): 2 [IU] via SUBCUTANEOUS
  Administered 2021-11-26: 3 [IU] via SUBCUTANEOUS
  Administered 2021-11-26 (×4): 2 [IU] via SUBCUTANEOUS
  Administered 2021-11-26 (×2): 3 [IU] via SUBCUTANEOUS
  Administered 2021-11-27 – 2021-11-28 (×5): 2 [IU] via SUBCUTANEOUS

## 2021-11-24 MED ORDER — INSULIN ASPART 100 UNIT/ML IJ SOLN
3.0000 [IU] | INTRAMUSCULAR | Status: DC
  Administered 2021-11-24 – 2021-11-27 (×8): 3 [IU] via SUBCUTANEOUS

## 2021-11-24 MED ORDER — SODIUM CHLORIDE 0.9% FLUSH
10.0000 mL | Freq: Two times a day (BID) | INTRAVENOUS | Status: DC
  Administered 2021-11-24 – 2021-11-25 (×3): 10 mL
  Administered 2021-11-26: 40 mL
  Administered 2021-11-26: 10 mL
  Administered 2021-11-27: 40 mL
  Administered 2021-11-27 – 2021-11-29 (×4): 10 mL

## 2021-11-24 MED ORDER — FENTANYL BOLUS VIA INFUSION
50.0000 ug | INTRAVENOUS | Status: DC | PRN
  Administered 2021-11-25 – 2021-11-29 (×12): 50 ug via INTRAVENOUS
  Filled 2021-11-24: qty 50

## 2021-11-24 MED ORDER — SODIUM CHLORIDE 0.9 % IV SOLN: INTRAVENOUS | Status: DC

## 2021-11-24 MED ORDER — MIDAZOLAM-SODIUM CHLORIDE 100-0.9 MG/100ML-% IV SOLN
2.0000 mg/h | INTRAVENOUS | Status: DC
  Administered 2021-11-24: 2 mg/h via INTRAVENOUS
  Administered 2021-11-25 – 2021-11-26 (×2): 4 mg/h via INTRAVENOUS
  Administered 2021-11-28: 2 mg/h via INTRAVENOUS
  Filled 2021-11-24 (×5): qty 100

## 2021-11-24 MED ORDER — SODIUM CHLORIDE 0.9% FLUSH: 10.0000 mL | INTRAVENOUS | Status: DC | PRN

## 2021-11-24 MED ORDER — FENTANYL 2500MCG IN NS 250ML (10MCG/ML) PREMIX INFUSION
50.0000 ug/h | INTRAVENOUS | Status: DC
  Administered 2021-11-24: 175 ug/h via INTRAVENOUS
  Administered 2021-11-25: 150 ug/h via INTRAVENOUS
  Administered 2021-11-25: 275 ug/h via INTRAVENOUS
  Administered 2021-11-26 – 2021-11-27 (×4): 225 ug/h via INTRAVENOUS
  Administered 2021-11-28 (×2): 300 ug/h via INTRAVENOUS
  Administered 2021-11-28: 250 ug/h via INTRAVENOUS
  Administered 2021-11-29: 300 ug/h via INTRAVENOUS
  Filled 2021-11-24 (×11): qty 250

## 2021-11-24 MED ORDER — ARTIFICIAL TEARS OPHTHALMIC OINT
1.0000 "application " | TOPICAL_OINTMENT | Freq: Three times a day (TID) | OPHTHALMIC | Status: DC
  Administered 2021-11-24 – 2021-11-29 (×14): 1 via OPHTHALMIC
  Filled 2021-11-24: qty 3.5

## 2021-11-24 MED ORDER — CISATRACURIUM BOLUS VIA INFUSION
0.1000 mg/kg | Freq: Once | INTRAVENOUS | Status: AC
  Administered 2021-11-24: 7.4 mg via INTRAVENOUS
  Filled 2021-11-24: qty 8

## 2021-11-24 MED ORDER — HEPARIN (PORCINE) 25000 UT/250ML-% IV SOLN
700.0000 [IU]/h | INTRAVENOUS | Status: DC
  Administered 2021-11-25: 700 [IU]/h via INTRAVENOUS
  Filled 2021-11-24: qty 250

## 2021-11-24 MED ORDER — HEPARIN SOD (PORK) LOCK FLUSH 100 UNIT/ML IV SOLN
500.0000 [IU] | INTRAVENOUS | Status: DC | PRN
  Administered 2021-11-24: 500 [IU]
  Filled 2021-11-24 (×2): qty 5

## 2021-11-24 MED ORDER — HEPARIN SOD (PORK) LOCK FLUSH 100 UNIT/ML IV SOLN
500.0000 [IU] | INTRAVENOUS | Status: DC
  Filled 2021-11-24: qty 5

## 2021-11-24 MED ORDER — MIDAZOLAM BOLUS VIA INFUSION
1.0000 mg | INTRAVENOUS | Status: DC | PRN
  Administered 2021-11-28 (×3): 1 mg via INTRAVENOUS
  Filled 2021-11-24: qty 2

## 2021-11-24 MED ORDER — SODIUM CHLORIDE 0.9 % IV SOLN
0.0000 ug/kg/min | INTRAVENOUS | Status: DC
  Administered 2021-11-24 – 2021-11-25 (×3): 3 ug/kg/min via INTRAVENOUS
  Administered 2021-11-27: 1.5 ug/kg/min via INTRAVENOUS
  Filled 2021-11-24 (×4): qty 20

## 2021-11-24 MED ORDER — FENTANYL CITRATE (PF) 100 MCG/2ML IJ SOLN: 50.0000 ug | Freq: Once | INTRAMUSCULAR | Status: DC

## 2021-11-24 NOTE — Progress Notes (Signed)
ANTICOAGULATION CONSULT NOTE  ? ?Pharmacy Consult for heparin ?Indication: atrial fibrillation ? ?Allergies  ?Allergen Reactions  ? Iodinated Contrast Media Anaphylaxis  ? Moxifloxacin Swelling  ?  REACTION: GI upset, throat "tightened up"  ? Penicillins Anaphylaxis  ?  Remote anaphylaxis reaction (not childhood) requiring MD/hospital care ?- tolerates Ceftin, Rocephin, Cefepime ?  ? Shellfish-Derived Products Anaphylaxis  ? Amoxicillin Swelling  ?  Throat tightened up ?Tolerates Ceftin, Rocephin, Cefepime  ? Chlorhexidine Other (See Comments)  ?  Wife is not aware of this allergy  ? Latex Rash  ?  Reaction to prolonged exposure  ? ? ?Patient Measurements: ?Height: '5\' 6"'$  (167.6 cm) ?Weight: 74.1 kg (163 lb 5.8 oz) ?IBW/kg (Calculated) : 63.8 ?Heparin Dosing Weight: 70.8 kg ? ?Vital Signs: ?Temp: 97.1 ?F (36.2 ?C) (03/10 1146) ?Temp Source: Axillary (03/10 1146) ?Pulse Rate: 88 (03/10 1400) ? ?Labs: ?Recent Labs  ?  12/01/2021 ?1456 11/26/2021 ?1721 11/21/2021 ?1742 12/10/2021 ?1742 12/07/2021 ?2034 11/23/21 ?0237 11/23/21 ?1006 11/23/21 ?1647 11/24/21 ?0302 11/24/21 ?1400  ?HGB 12.2*  --   --   --   --  11.8*  --   --  11.0*  --   ?HCT 38.1*  --   --   --   --  37.3*  --   --  33.4*  --   ?PLT 257  --   --   --   --  237  --   --  202  --   ?APTT  --   --  28   < >  --  86* 152*  --  139* 90*  ?LABPROT  --   --  16.4*  --   --   --   --   --   --   --   ?INR  --   --  1.3*  --   --   --   --   --   --   --   ?HEPARINUNFRC  --   --  0.96*   < >  --  1.04*  --   --  0.81* 0.45  ?CREATININE 0.66  --   --   --   --  0.87  --  1.04 1.18  --   ?TROPONINIHS  --  19*  --   --  27*  --   --   --   --   --   ? < > = values in this interval not displayed.  ? ? ? ?Estimated Creatinine Clearance: 48.1 mL/min (by C-G formula based on SCr of 1.18 mg/dL). ? ?Assessment: ?76 year old male presented to the ED from cancer center with possible anaphylactic reaction to chemotherapy infusion. He required non-rebreather and vasopressor support. He  is to be admitted to the ICU. Patient with history of atrial fibrillation on Eliquis PTA. Pharmacy consulted to manage heparin. Given patient acuity on presentation to ED, suspect last dose of Eliquis 3/8 morning prior to appointment at cancer center. ? ?11/24/21 ?Heparin level = 0.45 (trending down and appears to be correlating with aPTT) ?aPTT  90 sec, therapeutic on heparin at 650 units/hr ?CBC: Hgb 11, Pltc wnl ?No bleeding or interruptions noted ? ?Goal of Therapy:  ?Heparin level 0.3-0.7 units/ml ?aPTT 66-102 seconds ?Monitor platelets by anticoagulation protocol: Yes ?  ?Plan:  ?- Continue heparin infusion at 650 units/hr ?- Recheck aPTT in 8 hours to confirm ?- CBC & heparin level daily while on heparin infusion ? ?Tawnya Crook, PharmD, BCPS ?  Clinical Pharmacist ?11/24/2021 3:12 PM ? ? ? ? ? ?

## 2021-11-24 NOTE — Progress Notes (Addendum)
HEMATOLOGY-ONCOLOGY PROGRESS NOTE  ASSESSMENT AND PLAN: Justin Trujillo 76 y.o. male with medical history significant for metastatic esophageal adenocarcinoma.  Now admitted to the hospital following a possible infusion reaction in our office.  Admitted for shock and acute respiratory failure with hypoxia.  Remains intubated and is on pressors.   #Shock -Presumed to be anaphylactic, but had fever up to 102.4 on admission so also could be sepsis. -5-FU was infusing at the time of his reaction and coronary vasospasm also possibility.  However, troponin is only mildly elevated. -Cultures negative to date. -On empiric antibiotics -Receiving empiric anaphylaxis treatment with Solu-Medrol has received Benadryl and Pepcid. -On pressors. -CT chest/abdomen/pelvis with concern for consolidation in both lobes and pulmonary edema.   #Acute respiratory failure with hypoxia, bilateral consolidation the lower lobes, pulmonary edema, OSA, COPD -Intubated 11/23/2021 due to respiratory failure. -Status post bronchoscopy with cultures pending. -On Lasix  -Appreciate assistance from PCCM.   #Metastatic Adenocarcinoma of the Esophagus #Dysphagia -- Underwent palliative radiation in order to help alleviate his dysphagia and was able to tolerate p.o following radiation. --Unfortunately given the spread of this cancer to the skin we will need to treat this as metastatic disease with systemic therapy  -- Final testing on the tissue resulted with HER2 negative status, but TPS score of 60%.  Therefore he would be a good candidate for FOLFOX with nivolumab --Started Cycle 1 Day 1 of FOLFOX plus Nivolumab on 04/26/2021 with good tolerance.  --Restaging CT Scan form 10/20/2021 shows stable soft tissue thickening of the esophagus. No evidence of progression.  -Developed a reaction during his infusion for cycle #12 in our office on 11/19/2021.  Unclear if current condition related to anaphylactic reaction from chemotherapy  versus something else driving this.  I think anaphylactic reaction is less likely at this time as I would have expected him to improve at this point with interventions. -He has a low burden of disease and would recommend continuation of aggressive measures for now.  Reasonable to engage the palliative care team to assist with goals of care.  [name], DNP, AGPCNP-BC, AOCNP  SUBJECTIVE: The patient's oldest son is at the bedside.  Discussed with nursing and no major changes overnight.  Remains on mechanical ventilation and pressors.  No fevers documented.  Cultures and respiratory cultures so far negative.  Chest x-ray this morning showed bilateral worsening dense airspace disease, right greater than left consistent with edema and/or pneumonia with small increased pleural effusions.  Oncology History  Malignant neoplasm of lower third of esophagus ([place])  03/07/2021 Initial Diagnosis   Malignant neoplasm of lower third of esophagus ([place])   03/30/2021 Cancer Staging   Staging form: Esophagus - Adenocarcinoma, AJCC 8th Edition - Clinical stage from 03/30/2021: Stage IVB (cT2, cN0, pM1) - Signed by Orson Slick, MD on 03/30/2021 Stage prefix: Initial diagnosis Total positive nodes: 0    04/26/2021 -  Chemotherapy   Patient is on Treatment Plan : GASTROESOPHAGEAL FOLFOX + Nivolumab q14d        REVIEW OF SYSTEMS:   Review of Systems  Reason unable to perform ROS: Unable to obtain.   I have reviewed the past medical history, past surgical history, social history and family history with the patient and they are unchanged from previous note.   PHYSICAL EXAMINATION: ECOG PERFORMANCE STATUS: 4 - Bedbound  Vitals:   11/24/21 0600 11/24/21 0744  BP:    Pulse:    Resp: (!) 28   Temp:  97.8  F (36.6 C)  SpO2:  93%   Filed Weights   11/23/2021 1830 11/23/21 0500 11/24/21 0359  Weight: 71 kg 71 kg 74.1 kg    Intake/Output from previous day: 03/09 0701 - 03/10 0700 In: 3781.8  [I.V.:3059.9; NG/GT:226.5; IV Piggyback:495.4] Out: 400 [Urine:400]  Physical Exam Vitals reviewed.  Constitutional:      Appearance: He is ill-appearing.  HENT:     Head: Normocephalic.  Cardiovascular:     Rate and Rhythm: Normal rate.  Pulmonary:     Comments: Diminished, rales present Abdominal:     Palpations: Abdomen is soft.  Skin:    General: Skin is warm and dry.  Neurological:     Comments: Intubated, sedated    LABORATORY DATA:  I have reviewed the data as listed CMP Latest Ref Rng & Units 11/24/2021 11/23/2021 11/23/2021  Glucose 70 - 99 mg/dL 243(H) 233(H) 140(H)  BUN 8 - 23 mg/dL 34(H) 25(H) 14  Creatinine 0.61 - 1.24 mg/dL 1.18 1.04 0.87  Sodium 135 - 145 mmol/L 128(L) 128(L) 129(L)  Potassium 3.5 - 5.1 mmol/L 4.2 4.2 4.2  Chloride 98 - 111 mmol/L 97(L) 96(L) 99  CO2 22 - 32 mmol/L 22 21(L) 20(L)  Calcium 8.9 - 10.3 mg/dL 6.8(L) 7.0(L) 7.9(L)  Total Protein 6.5 - 8.1 g/dL 4.7(L) - -  Total Bilirubin 0.3 - 1.2 mg/dL 0.5 - -  Alkaline Phos 38 - 126 U/L 98 - -  AST 15 - 41 U/L 53(H) - -  ALT 0 - 44 U/L 22 - -    Lab Results  Component Value Date   WBC 29.6 (H) 11/24/2021   HGB 11.0 (L) 11/24/2021   HCT 33.4 (L) 11/24/2021   MCV 97.9 11/24/2021   PLT 202 11/24/2021   NEUTROABS 8.2 (H) 12/11/2021    Lab Results  Component Value Date   CEA1 2.07 02/06/2021   ONG295 46 (H) 02/06/2021   PSA1 0.8 02/06/2021    CT ABDOMEN PELVIS WO CONTRAST  Result Date: 12/06/2021 CLINICAL DATA:  Hypercoagulable state.  Concern for PE. EXAM: CT CHEST, ABDOMEN AND PELVIS WITHOUT CONTRAST TECHNIQUE: Multidetector CT imaging of the chest, abdomen and pelvis was performed following the standard protocol without IV contrast. RADIATION DOSE REDUCTION: This exam was performed according to the departmental dose-optimization program which includes automated exposure control, adjustment of the mA and/or kV according to patient size and/or use of iterative reconstruction technique.  COMPARISON:  Chest, abdomen and pelvis CT no contrast 10/26/2021, and chest, abdomen and pelvis CT with contrast 07/13/2021. FINDINGS: CT chest without contrast findings Cardiovascular: There is mild cardiomegaly, small pericardial effusion, scattered calcifications in the aortic valve annulus and heavy calcifications in the left lateral and inferior mitral annulus. 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 Right IJ port catheter tip remains in the SVC. There is moderate atherosclerosis in the thoracic aorta and great vessels and 4.1 cm aneurysmal ectasia, unchanged in the ascending segment, the remainder normal in caliber. Lipomatous hypertrophy of the inter-atrial septum appears similar as well as upper limit of normal caliber the pulmonary arteries. Assessment for arterial emboli can't be made without contrast. The superior pulmonary veins are more distended than previously. Mediastinum/Nodes: Patulous esophagus containing scattered fluid, mild distal esophageal wall thickening is unchanged. The lower poles of the thyroid gland and axillary spaces are unremarkable, with the thyroid partially obscured by streak artifact from bilateral shoulder replacements. No intrathoracic adenopathy is appreciated without contrast, but noncontrast assessment is  somewhat limited. There are retained versus aspirated secretions in the trachea and in the posterior right main bronchus of which were noted on the last CT. Lungs/Pleura: There are increased small symmetric layering pleural effusions. Increased subpleural septal thickening in the lung bases and apices is seen consistent with interstitial edema with slight central interstitial edema also believed present. There is increased posterior consolidation or atelectasis in both lower lobes. There is increased perihilar airspace disease in both upper lobes and increased scattered ground-glass opacities in the peripheral apices,  both findings greater on the right. Increased peripheral subpleural consolidation is similar to last month's study but certainly increased from 07/13/2021, with background subpleural reticulation in the mid to lower lung fields noted at that time without honeycombing. There is no pneumothorax. Central bronchial thickening continues to be noted in all lobes. Musculoskeletal: Osteopenia and degenerative change thoracic spine. No displaced rib or sternal fracture. Subacute mild-to-moderate compression fractures T11 vertebral bodies are again noted. Fractures were probably recent to early subacute on the 10/26/2021 exam and there is increased subcortical vacuum phenomenon upper plate of Z61 and lower plate of W96 and in the disc space compared to the prior study, at T12 with few mm retropulsion again shown. There is only mild spinal canal stenosis associated with this. No pedicle or posterior element fracture. There is edema in the chest wall which is likely congestive or from fluid overload. Artifact from bilateral shoulder replacements is again noted. CT ABDOMEN PELVIS FINDINGS Factors affecting image quality: Respiratory motion limits fine detail in the abdomen as well as artifact from the patient's arms. Hepatobiliary: No focal liver abnormality is seen through the motion and streak artifact. The gallbladder is distended and otherwise unremarkable. There is no biliary dilatation. Pancreas: No focal abnormality is seen through artifacts. No ductal dilatation or adjacent edema. Spleen: No focal noncontrast CT abnormality or splenomegaly.Stranding along the spleen noted on the last CT is not seen today. Adrenals/Urinary Tract: There is no adrenal mass. Bilateral perinephric stranding appears similar with minimal perinephric fluid. No evidence of urinary stone or obstruction, no focal abnormality of the renal cortex allowing for spray artifact from adjacent metallic spinal hardware. There is mild bladder thickening  versus underdistention. No masslike thickening is seen. Stomach/Bowel: No dilated or inflamed bowel segments are identified. There is a normal caliber appendix. There is mild-to-moderate fecal stasis in the rectosigmoid colon. Left colonic diverticula without inflammation. Small hiatal hernia. Vascular/Lymphatic: Heavy aortoiliac atherosclerosis is seen. No AAA is seen. No appreciable adenopathy through the artifacts. Reproductive: Prominent prostate 5.1 cm transverse with central calcifications. Other: There is minimal abdominal and pelvic free ascites. There is body wall anasarca increased over prior study. Small umbilical and inguinal fat hernias. 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 IMPRESSION: 1. Findings consistent with CHF or fluid overload, with cardiomegaly, prominent superior pulmonary veins, at least mild features of interstitial edema in the lungs, small pleural effusions and body wall anasarca. Minimal ascites in the abdomen and pelvis. 2. 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 Annual CTA or MRA follow-up recommended and vascular surgery referral unless already done. 3. Increased airspace disease in the lung fields, probably a combination of pneumonia and edema. Background subpleural reticulation in the mid and lower lungs. Follow-up study recommended after treatment. 4. Bilateral bronchial thickening is similar to the last CT as well as aspirated or  retained secretions in the trachea and right main bronchus. 5. Mild-to-moderate wedge compression fractures of T11 and 12, similar in degree but with increased vacuum phenomenon in the vertebral bodies and intervening disc space since the last CT. 6. Small hiatal hernia with similar distal esophageal thickening. 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  Clinical correlation advised. 7. Mild bladder thickening versus nondistention.  Prostatomegaly. 8. Rectosigmoid constipation without bowel obstruction or gross inflammatory reaction. 9. Distended but otherwise unremarkable gallbladder. 10. Aortic and coronary artery atherosclerosis Electronically Signed   By: Telford Nab M.D.   On: 12/15/2021 23:24   CT HEAD WO CONTRAST  Result Date: 10/26/2021 CLINICAL DATA:  Moderate to severe head trauma EXAM: CT HEAD WITHOUT CONTRAST CT CERVICAL SPINE WITHOUT CONTRAST TECHNIQUE: Multidetector CT imaging of the head and cervical spine was performed following the standard protocol without intravenous contrast. Multiplanar CT image reconstructions of the cervical spine were also generated. RADIATION DOSE REDUCTION: This exam was performed according to the departmental dose-optimization program which includes automated exposure control, adjustment of the mA and/or kV according to patient size and/or use of iterative reconstruction technique. COMPARISON:  None. FINDINGS: CT HEAD FINDINGS Brain: No evidence of acute infarction, hemorrhage, hydrocephalus, extra-axial collection or mass lesion/mass effect. 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 Cerebral volume loss, central predominant. Small, incidental calcification in the inter hemispheric fissure. Vascular: No hyperdense vessel or unexpected calcification. Skull: No acute fracture. Sinuses/Orbits: Bilateral cataract resection. Fluid levels that are low-density in the left maxillary and sphenoid sinuses. Left mastoid and middle ear opacification that is subtotal. CT CERVICAL SPINE FINDINGS Alignment: Reversal of cervical lordosis. Skull base and vertebrae: Streak artifact intermittent motion affects visualization, streak artifact heavily affecting the C1 and C2 levels. No convincing fracture. Generalized osteopenia. Soft tissues and spinal canal: No prevertebral fluid or swelling. No visible canal  hematoma. Disc levels: Generalized degenerative disc narrowing and ridging. Multilevel facet spurring. Upper chest: Negative IMPRESSION: 1. No evidence of acute intracranial injury. Negative for cervical spine fracture. 2. Left-sided sinusitis with fluid levels. Left mastoid and middle ear opacification. 3. 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 Electronically Signed   By: Jorje Guild M.D.   On: 10/26/2021 10:26   CT CHEST WO CONTRAST  Result Date: 11/16/2021 CLINICAL DATA:  Hypercoagulable state.  Concern for PE. EXAM: CT CHEST, ABDOMEN AND PELVIS WITHOUT CONTRAST TECHNIQUE: Multidetector CT imaging of the chest, abdomen and pelvis was performed following the standard protocol without IV contrast. RADIATION DOSE REDUCTION: This exam was performed according to the departmental dose-optimization program which includes automated exposure control, adjustment of the mA and/or kV according to patient size and/or use of iterative reconstruction technique. COMPARISON:  Chest, abdomen and pelvis CT no contrast 10/26/2021, and chest, abdomen and pelvis CT with contrast 07/13/2021. FINDINGS: CT chest without contrast findings Cardiovascular: There is mild cardiomegaly, small pericardial effusion, scattered calcifications in the aortic valve annulus and heavy calcifications in the left lateral and inferior mitral annulus. 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 Right IJ port catheter tip remains in the SVC. There is moderate atherosclerosis in the thoracic aorta and great vessels and 4.1 cm aneurysmal ectasia, unchanged in the ascending segment, the remainder normal in caliber. Lipomatous hypertrophy of the inter-atrial septum appears similar as well as upper limit of normal caliber the pulmonary arteries. Assessment for arterial emboli can't be made without contrast. The superior pulmonary veins are more distended than  previously.  Mediastinum/Nodes: Patulous esophagus containing scattered fluid, mild distal esophageal wall thickening is unchanged. The lower poles of the thyroid gland and axillary spaces are unremarkable, with the thyroid partially obscured by streak artifact from bilateral shoulder replacements. No intrathoracic adenopathy is appreciated without contrast, but noncontrast assessment is somewhat limited. There are retained versus aspirated secretions in the trachea and in the posterior right main bronchus of which were noted on the last CT. Lungs/Pleura: There are increased small symmetric layering pleural effusions. Increased subpleural septal thickening in the lung bases and apices is seen consistent with interstitial edema with slight central interstitial edema also believed present. There is increased posterior consolidation or atelectasis in both lower lobes. There is increased perihilar airspace disease in both upper lobes and increased scattered ground-glass opacities in the peripheral apices, both findings greater on the right. Increased peripheral subpleural consolidation is similar to last month's study but certainly increased from 07/13/2021, with background subpleural reticulation in the mid to lower lung fields noted at that time without honeycombing. There is no pneumothorax. Central bronchial thickening continues to be noted in all lobes. Musculoskeletal: Osteopenia and degenerative change thoracic spine. No displaced rib or sternal fracture. Subacute mild-to-moderate compression fractures T11 vertebral bodies are again noted. Fractures were probably recent to early subacute on the 10/26/2021 exam and there is increased subcortical vacuum phenomenon upper plate of Y70 and lower plate of W23 and in the disc space compared to the prior study, at T12 with few mm retropulsion again shown. There is only mild spinal canal stenosis associated with this. No pedicle or posterior element fracture. There is edema in the  chest wall which is likely congestive or from fluid overload. Artifact from bilateral shoulder replacements is again noted. CT ABDOMEN PELVIS FINDINGS Factors affecting image quality: Respiratory motion limits fine detail in the abdomen as well as artifact from the patient's arms. Hepatobiliary: No focal liver abnormality is seen through the motion and streak artifact. The gallbladder is distended and otherwise unremarkable. There is no biliary dilatation. Pancreas: No focal abnormality is seen through artifacts. No ductal dilatation or adjacent edema. Spleen: No focal noncontrast CT abnormality or splenomegaly.Stranding along the spleen noted on the last CT is not seen today. Adrenals/Urinary Tract: There is no adrenal mass. Bilateral perinephric stranding appears similar with minimal perinephric fluid. No evidence of urinary stone or obstruction, no focal abnormality of the renal cortex allowing for spray artifact from adjacent metallic spinal hardware. There is mild bladder thickening versus underdistention. No masslike thickening is seen. Stomach/Bowel: No dilated or inflamed bowel segments are identified. There is a normal caliber appendix. There is mild-to-moderate fecal stasis in the rectosigmoid colon. Left colonic diverticula without inflammation. Small hiatal hernia. Vascular/Lymphatic: Heavy aortoiliac atherosclerosis is seen. No AAA is seen. No appreciable adenopathy through the artifacts. Reproductive: Prominent prostate 5.1 cm transverse with central calcifications. Other: There is minimal abdominal and pelvic free ascites. There is body wall anasarca increased over prior study. Small umbilical and inguinal fat hernias. 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 IMPRESSION: 1. Findings consistent with CHF or fluid  overload, with cardiomegaly, prominent superior pulmonary veins, at least mild features of interstitial edema in the lungs, small pleural effusions and body wall anasarca. Minimal ascites in the abdomen and pelvis. 2. 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 Annual CTA or MRA follow-up recommended and  vascular surgery referral unless already done. 3. Increased airspace disease in the lung fields, probably a combination of pneumonia and edema. Background subpleural reticulation in the mid and lower lungs. Follow-up study recommended after treatment. 4. Bilateral bronchial thickening is similar to the last CT as well as aspirated or retained secretions in the trachea and right main bronchus. 5. Mild-to-moderate wedge compression fractures of T11 and 12, similar in degree but with increased vacuum phenomenon in the vertebral bodies and intervening disc space since the last CT. 6. Small hiatal hernia with similar distal esophageal thickening. 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 Clinical correlation advised. 7. Mild bladder thickening versus nondistention.  Prostatomegaly. 8. Rectosigmoid constipation without bowel obstruction or gross inflammatory reaction. 9. Distended but otherwise unremarkable gallbladder. 10. Aortic and coronary artery atherosclerosis Electronically Signed   By: Telford Nab M.D.   On: 11/24/2021 23:24   CT CERVICAL SPINE WO CONTRAST  Result Date: 10/26/2021 CLINICAL DATA:  Moderate to severe head trauma EXAM: CT HEAD WITHOUT CONTRAST CT CERVICAL SPINE WITHOUT CONTRAST TECHNIQUE: Multidetector CT imaging of the head and cervical spine was performed following the standard protocol without intravenous contrast. Multiplanar CT image reconstructions of the cervical spine were also generated. RADIATION DOSE REDUCTION: This exam was performed according to the departmental dose-optimization program which includes automated exposure control, adjustment of the mA and/or kV according to  patient size and/or use of iterative reconstruction technique. COMPARISON:  None. FINDINGS: CT HEAD FINDINGS Brain: No evidence of acute infarction, hemorrhage, hydrocephalus, extra-axial collection or mass lesion/mass effect. 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 Cerebral volume loss, central predominant. Small, incidental calcification in the inter hemispheric fissure. Vascular: No hyperdense vessel or unexpected calcification. Skull: No acute fracture. Sinuses/Orbits: Bilateral cataract resection. Fluid levels that are low-density in the left maxillary and sphenoid sinuses. Left mastoid and middle ear opacification that is subtotal. CT CERVICAL SPINE FINDINGS Alignment: Reversal of cervical lordosis. Skull base and vertebrae: Streak artifact intermittent motion affects visualization, streak artifact heavily affecting the C1 and C2 levels. No convincing fracture. Generalized osteopenia. Soft tissues and spinal canal: No prevertebral fluid or swelling. No visible canal hematoma. Disc levels: Generalized degenerative disc narrowing and ridging. Multilevel facet spurring. Upper chest: Negative IMPRESSION: 1. No evidence of acute intracranial injury. Negative for cervical spine fracture. 2. Left-sided sinusitis with fluid levels. Left mastoid and middle ear opacification. 3. 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 Electronically Signed   By: Jorje Guild M.D.   On: 10/26/2021 10:26   DG Pelvis Portable  Result Date: 10/26/2021 CLINICAL DATA:  Found down.  Weakness EXAM: PORTABLE PELVIS 1-2 VIEWS COMPARISON:  10/20/2021 FINDINGS: Bones appear demineralized. There is no evidence of pelvic fracture or diastasis. No pelvic bone lesions are seen. Large volume of stool projects throughout the visualized colon. Atherosclerotic vascular calcifications are present. IMPRESSION: Negative. Electronically Signed   By: Davina Poke D.O.   On: 10/26/2021 09:33   Portable  Chest xray  Result Date: 11/24/2021 CLINICAL DATA:  Ventilator dependent respiratory failure. EXAM: PORTABLE CHEST 1 VIEW COMPARISON:  Portable chest yesterday at 6:02 p.m. FINDINGS: NGT tip is probably in the body of stomach based on the position of the proximal side hole with the tip not included in the exam. ETT tip is 4 cm from the carina with left IJ line tip at the cavoatrial junction level and similar tip position of a right chest port catheter. There is aortic atherosclerosis, stable mediastinum. Mild  cardiomegaly. Central vessels are obscured. Dense bilateral airspace disease in the lower 2/3 of both lungs has worsened, and today extends to involve the periphery of both lungs, previously was perihilar-predominant and not quite as confluent, with increasing small bilateral pleural effusions. The apical lungs remain clear. Bilateral shoulder replacements are again noted. Osteopenia. IMPRESSION: 1. Bilateral worsening dense airspace disease right-greater-than-left, consistent with edema and/or pneumonia with small increased pleural effusions. 2. In all other respects no further changes.  Stable cardiac size. Electronically Signed   By: Telford Nab M.D.   On: 11/24/2021 07:24   DG CHEST PORT 1 VIEW  Result Date: 11/23/2021 CLINICAL DATA:  Central line placement EXAM: PORTABLE CHEST 1 VIEW COMPARISON:  11/23/2021 at 9:51 a.m. FINDINGS: Right Port-A-Cath tip: SVC. Left internal jugular central venous catheter tip: SVC. Endotracheal tube tip: 3.4 cm above the carina. Nasogastric tube: Enters the stomach. No pneumothorax. Continued dense perihilar airspace opacities with some obscuration of the hemidiaphragms. Bilateral shoulder arthroplasties. Bony demineralization. Atherosclerotic calcification of the aortic arch. IMPRESSION: 1. The new left IJ central venous catheter tip projects over the SVC. No pneumothorax. 2. New nasogastric tube enters the stomach and likely terminates in the stomach based on the  position of the side port. 3. Stable dense perihilar and basilar airspace opacities. 4. The rest of the support apparatus appears satisfactorily positioned. Electronically Signed   By: Van Clines M.D.   On: 11/23/2021 18:10   Portable Chest x-ray  Result Date: 11/23/2021 CLINICAL DATA:  Endotracheal tube placement. EXAM: PORTABLE CHEST 1 VIEW COMPARISON:  One-view chest x-ray 11/23/2021 FINDINGS: Heart is enlarged. Diffuse interstitial and airspace opacities are similar the prior study. Bilateral pleural effusions are present. Right IJ Port-A-Cath is in place. Patient now intubated. The endotracheal tube terminates 5 cm above the carina, in satisfactory position. Bilateral shoulder replacements noted. IMPRESSION: 1. Interval intubation. Satisfactory positioning of the endotracheal tube. 2. Stable diffuse interstitial and airspace opacities compatible with edema or infection. 3. Bilateral pleural effusions. Electronically Signed   By: San Morelle M.D.   On: 11/23/2021 10:27   DG Chest Port 1 View  Result Date: 11/23/2021 CLINICAL DATA:  Initial evaluation for increased shortness of breath. EXAM: PORTABLE CHEST 1 VIEW COMPARISON:  Prior radiograph from 12/13/2021. FINDINGS: Right-sided Port-A-Cath in place, stable. Cardiomegaly grossly unchanged. Mediastinal silhouette within normal limits. Lungs are hypoinflated. Extensive bilateral airspace disease involving predominantly the mid and lower lungs, progressed and worsened from previous, and likely reflecting worsened pulmonary edema. Superimposed small to moderate bilateral pleural effusions, also progressed and worsened. Probable superimposed bibasilar atelectasis. Superimposed infiltrates difficult to exclude. No pneumothorax. Visualized osseous structures are unchanged. Bilateral shoulder arthroplasties partially visualized. IMPRESSION: 1. Interval worsening in extensive bilateral airspace disease involving primarily the mid and lower lungs,  likely reflecting worsened pulmonary edema. 2. Superimposed small to moderate bilateral pleural effusions, also progressed and worsened. Findings suggest progressive CHF. Electronically Signed   By: Jeannine Boga M.D.   On: 11/23/2021 02:52   DG Chest Port 1 View  Result Date: 12/08/2021 CLINICAL DATA:  Hypoxia, severe epigastric and abdominal pain. EXAM: PORTABLE CHEST 1 VIEW COMPARISON:  Radiographs dated October 30, 2021 FINDINGS: The heart is enlarged. Bilateral perihilar and lower lobe opacities concerning for airspace disease. Right IJ access Port-A-Cath with distal tip in the SVC, unchanged. Bilateral shoulder arthroplasty. IMPRESSION: 1.  Stable cardiomegaly. 2. Bilateral perihilar and lower lobe hazy opacities, which may represent pulmonary edema or infiltrate. Clinical correlation is suggested. Electronically Signed  By: Keane Police D.O.   On: 11/27/2021 15:40   DG CHEST PORT 1 VIEW  Result Date: 10/30/2021 CLINICAL DATA:  Dyspnea EXAM: PORTABLE CHEST 1 VIEW COMPARISON:  Chest radiograph from one day prior. FINDINGS: Right internal jugular Port-A-Cath terminates in the middle third of the SVC. Right shoulder hemiarthroplasty and partially visualized left shoulder total arthroplasty. Stable cardiomediastinal silhouette with mild cardiomegaly. No pneumothorax. No pleural effusion. Hazy streaky opacities in peripheral mid to lower left lung, similar. Largely resolved hazy right lung base opacities. IMPRESSION: 1. Largely resolved hazy right lung base opacities. 2. Stable hazy streaky opacities in the peripheral mid to lower left lung, favoring pneumonia. 3. Stable cardiomegaly. Electronically Signed   By: Ilona Sorrel M.D.   On: 10/30/2021 08:05   DG CHEST PORT 1 VIEW  Result Date: 10/29/2021 CLINICAL DATA:  76 year old male with history of shortness of breath. EXAM: PORTABLE CHEST 1 VIEW COMPARISON:  Chest x-ray 10/28/2021. FINDINGS: Right internal jugular single-lumen power porta cath  with tip terminating in the mid superior vena cava. Lung volumes are slightly low. Diffuse peribronchial cuffing and widespread interstitial prominence, with patchy ill-defined peripheral predominant airspace disease, similar to the prior study. No pneumothorax. No suspicious appearing pulmonary nodules or masses are noted. Heart size is normal. Upper mediastinal contours are within normal limits. Status post bilateral shoulder arthroplasties. IMPRESSION: 1. The appearance the chest again suggests multilobar bilateral bronchopneumonia, with similar aeration compared to the prior examination. 2. Postoperative changes and support apparatus, as above. Electronically Signed   By: Vinnie Langton M.D.   On: 10/29/2021 09:23   DG CHEST PORT 1 VIEW  Result Date: 10/28/2021 CLINICAL DATA:  Shortness of breath Evaluate for pneumonia EXAM: PORTABLE CHEST 1 VIEW COMPARISON:  10/26/2021 FINDINGS: Unchanged mild cardiomegaly. Right chest port is unchanged in position. Bilateral shoulder prostheses again seen. Mild pulmonary vascular congestion is unchanged. Peripheral lung opacities are not significantly changed compared to prior CT exam. IMPRESSION: 1. Unchanged mild cardiomegaly. 2. Unchanged bilateral subpleural parenchymal opacities. Electronically Signed   By: Miachel Roux M.D.   On: 10/28/2021 13:42   DG Chest Port 1 View  Result Date: 10/26/2021 CLINICAL DATA:  Family found the patient on the floor, last seen yesterday after chemotherapy when patient complained of weakness. Patient does not remember falling. EXAM: PORTABLE CHEST 1 VIEW COMPARISON:  CT examination dated October 20, 2021 FINDINGS: The heart is enlarged. There are bilateral lower lobe opacities, left worse than the right concerning for pneumonia including aspiration pneumonia. Bilateral shoulder arthroplasty. Right IJ access MediPort with distal tip in the SVC. IMPRESSION: 1. Stable cardiomegaly. 2. Bilateral lower lobe opacities, left worse than the  right concerning for pneumonia including aspiration pneumonia. Follow-up examination to resolution is recommended. Electronically Signed   By: Keane Police D.O.   On: 10/26/2021 09:35   DG Abd Portable 1V  Result Date: 11/23/2021 CLINICAL DATA:  Placement enteric tube EXAM: PORTABLE ABDOMEN - 1 VIEW COMPARISON:  11/04/2015 FINDINGS: Tip of enteric tube is seen in the stomach. Bowel gas pattern in the upper abdomen is unremarkable. Heart is enlarged in size. There is prominence of pulmonary vessels along with interstitial and alveolar densities in both lungs, possibly suggesting pulmonary edema. Dextroscoliosis and degenerative changes are noted in lumbar spine. There is surgical fusion in the lumbar spine at L3-L4 level. IMPRESSION: Tip of enteric tube is seen in the stomach. Electronically Signed   By: Elmer Picker M.D.   On: 11/23/2021 13:38   CT  CHEST ABDOMEN PELVIS WO CONTRAST  Result Date: 10/26/2021 CLINICAL DATA:  76 year old male presents for evaluation following fall. Patient also with history of esophageal neoplasm. EXAM: CT CHEST, ABDOMEN AND PELVIS WITHOUT CONTRAST TECHNIQUE: Multidetector CT imaging of the chest, abdomen and pelvis was performed following the standard protocol without IV contrast. RADIATION DOSE REDUCTION: This exam was performed according to the departmental dose-optimization program which includes automated exposure control, adjustment of the mA and/or kV according to patient size and/or use of iterative reconstruction technique. COMPARISON:  October 20, 2021. FINDINGS: CT CHEST FINDINGS Cardiovascular: The aorta shows smooth contours without adjacent stranding. No mediastinal hematoma. Aortic dilation to approximately 4 cm greatest axial dimension is unchanged. Generalized aortic atherosclerosis. Signs of mitral annular calcification. Three-vessel coronary artery disease. No substantial pericardial effusion, stable small pericardial effusion when compared to previous  imaging from just 6 days ago RIGHT-sided Port-A-Cath terminates at the caval to atrial junction. Mediastinum/Nodes: Patulous esophagus similar to the prior exam. Mild thickening of the distal esophagus also unchanged in this patient with known history of esophageal neoplasm. No mediastinal hematoma or adenopathy. No axillary or thoracic inlet adenopathy. Lungs/Pleura: No pneumothorax. Reticular opacities and nodular changes along the pleural surface with increasing conspicuity since previous imaging. Pleural based airspace disease with nodular features at the lung bases and along the anterior chest, these findings have developed since the previous exam of just 6 days ago. No pneumothorax. Material and the trachea and material layering in the mainstem bronchi extending into peripheral bronchi with signs of bronchial wall thickening. Musculoskeletal: See below for full musculoskeletal details. CT ABDOMEN PELVIS FINDINGS Hepatobiliary: Liver with smooth contours. Trace fluid adjacent to medial RIGHT hemi liver similar to previous imaging. No gross signs of hepatic trauma. No Peri hepatic fluid. No pericholecystic stranding. Pancreas: Pancreatic atrophy without signs of adjacent inflammation. Spleen: Trace stranding adjacent to the spleen. Minimal fluid adjacent to the spleen is similar to the study of October 20, 2021. No gross splenic abnormality. Adrenals/Urinary Tract: Mild bilateral adrenal thickening. Perinephric stranding bilaterally perhaps slightly increased compared to previous imaging. Generalized increase in fascial thickening and edema albeit mild since the previous study. No hydronephrosis.  No perinephric hematoma. Stomach/Bowel: Small hiatal hernia and distal esophageal thickening as described. No signs of bowel dilation to suggest obstruction of the small bowel. The appendix is normal. Colon is stool filled without signs of adjacent stranding. No signs of pneumatosis. No pneumoperitoneum. No pelvic  fluid. Vascular/Lymphatic: Aortic atherosclerosis. No sign of aneurysm. Smooth contour of the IVC. There is no gastrohepatic or hepatoduodenal ligament lymphadenopathy. No retroperitoneal or mesenteric lymphadenopathy. No pelvic sidewall lymphadenopathy. Atherosclerosis is moderate to marked. Vascular structures not well assessed given the lack of intravenous contrast. No focal stranding or hematoma adjacent to the abdominal aorta or pelvic vessels. Reproductive: Unremarkable by CT. Urinary bladder is moderately distended extending to the sacral promontory. Other: No fluid in the pelvis. Trace fluid about the liver and spleen similar to recent comparison imaging. Musculoskeletal: No displaced rib fractures. Mildly limited assessment due to respiratory motion. Bony pelvis without signs of fracture. Extensive spinal degenerative changes and osteopenia. Post bilateral shoulder arthroplasty. Visualized clavicles and scapulae are intact. Signs of prior L3-L4 spinal fusion and cement augmentation at L4 showing no change. Spinal compression fractures with similar appearance at the T11-T12 level based on recent comparison imaging with approximally 30% loss of height at these levels. Spinal alignment is unchanged. Sternum grossly intact with some artifact crossing the anterior sternum due to motion. IMPRESSION:  1. No evidence of acute traumatic injury to the chest, abdomen or pelvis. 2. Peripheral nodular parenchymal consolidative changes with marked worsening since the recent comparison imaging potentially post/drug treatment related given pattern seen and rapid interval progression. Correlate with any respiratory symptoms. Findings are felt to have an overlay of acute inflammatory changes related to aspiration given findings in the bronchi. However, subpleural changes extend along the anterior chest which would be atypical for purely aspiration related changes. 3. Small hiatal hernia and distal esophageal thickening also  unchanged in this patient with known history of esophageal neoplasm. 4. Mild generalized edema with perinephric stranding and fascial edema in the abdomen not substantially changed compared to recent imaging. 5. Unchanged appearance of T11-T12 compression fractures. 6. Aortic atherosclerosis. Three-vessel coronary artery disease. 7. Ascending thoracic aortic aneurysm. Recommend annual imaging followup by CTA or MRA. This recommendation follows 2010 ACCF/AHA/AATS/ACR/ASA/SCA/SCAI/SIR/STS/SVM Guidelines for the Diagnosis and Management of Patients with Thoracic Aortic Disease. Circulation. 2010; 121: I347-Q259. Aortic aneurysm NOS (ICD10-I71.9) Aortic Atherosclerosis (ICD10-I70.0). Electronically Signed   By: Zetta Bills M.D.   On: 10/26/2021 10:31     Future Appointments  Date Time Provider [place]  11/24/2021  1:15 PM [facility] [place] None  2021-12-17  9:00 AM Fredirick Maudlin, MD [facility]  12/04/2021  2:45 PM Baldwin [place], PA-C [facility]  12/06/2021 10:00 AM [facility] [place] FLUSH [facility] None  12/06/2021 10:30 AM Dede Query T, PA-C [facility] None  12/06/2021 11:30 AM [facility] INFUSION [facility] None  12/06/2021  1:45 PM Morrell Riddle, RD [facility] None  12/08/2021  1:00 PM [facility] [place] FLUSH [facility] None  12/20/2021  9:30 AM [facility] [place] FLUSH [facility] None  12/20/2021 10:00 AM Orson Slick, MD [facility] None  12/20/2021 11:00 AM [facility] INFUSION [facility] None  12/22/2021  1:00 PM [facility] [place] FLUSH [facility] None  04/03/2022  1:00 PM Tat, Rebecca S, DO [facility] None      LOS: 2 days   aDDENDUM  I have seen the patient, examined him. I agree with the assessment and and plan and have edited the notes.   I have reviewed his chart, lab and images in detail . His CT scan 2 days ago showed diffuse intrastitial change, and scattered groundglass opacities in bilateral lungs, possible etiology including CHF, multifocal  pneumonia, and immunotherapy mediated pneumonitis.  Patient is critically ill, on mechanical ventilation, multiple vasopressors, his prognosis is guarded.  His admission presentation was most consistent with severe sepsis but he has not responded well to broad antibiotics.  Cultures have been negative.  I spoke with Dr. Brantley Persons, I agree with broad antibiotics and supportive care, I recommend moderate dose steroids, due to my concern of immunotherapy induced pneumonitis. Dr. Chase Caller is concerned about his severe sepsis and would like to repeat some labs before considering steroid. I will f/u this weekend. I attempted to call his wife but no answers.   Truitt Merle  11/24/2021

## 2021-11-24 NOTE — Progress Notes (Addendum)
eLink Physician-Brief Progress Note ?Patient Name: Justin Trujillo. ?DOB: March 11, 1946 ?MRN: [id number] ? ? ?Date of Service ? 11/24/2021  ?HPI/Events of Note ? Notified of ABG results - 7.42/45/130 on PEEP 12, 100%, rate 28, TV 380. Pt remains on levophed and has HCO3 gtt running at 125cc/hr.   ?eICU Interventions ? Change IVFs to NS'@50cc'$ /hr. Discontinue HCO3 gtt.  ?Titrate FiO2 to 80%.   ? ? ? ?Intervention Category ?Intermediate Interventions: Diagnostic test evaluation ? ?Elsie Lincoln ?11/24/2021, 8:58 PM ? ?10:21 PM ?Gerhardt butt cream ordered as per request. ? ?4:30 AM ?ABG on 380, rate 28, PEEP 12, 80% FIO2 -- 7.53/34/151. ? ?Plan> ?Titrate FiO2 down to 70%, decrease RR down to 20 (from 28). ?

## 2021-11-24 NOTE — Progress Notes (Addendum)
Patient proned at 1730 w/ assist from RT and 5 additional RNs. Patient tolerated well, ETT and OGT measurements unchanged. Patient due to be supinated at 0930 on 3/11. Will continue current plan of care. ?

## 2021-11-24 NOTE — Progress Notes (Signed)
eLink Physician-Brief Progress Note ?Patient Name: Justin Trujillo. ?DOB: 1946/02/22 ?MRN: [id number] ? ? ?Date of Service ? 11/24/2021  ?HPI/Events of Note ? Notified of ABG. O2 sat is 95 on 100%/peep 12  ?2. Hyperglycemia on bicarb infusion. This is in D5  ?eICU Interventions ? Continue current support ?2. Change bicarb fluids to non dextrose containing fluids   ? ? ? ?Intervention Category ?Major Interventions: Respiratory failure - evaluation and management ? ?[name] ?11/24/2021, 4:03 AM ?

## 2021-11-24 NOTE — Progress Notes (Signed)
Brief Pharmacy Anti-Coagulation Note: ? ?For full details see note from Dorena Bodo PharmD ? ?A/P: ?-aPTT 64, slightly subtherapeutic on 650 units/hr ?-Per RN no bleeding or interruptions, skin is very fragile though and tears with the slightest touch ?- increase heparin drip to 700 units/hr ?- check aPTT in 8 hours--suspect can use heparin levels from here on out ? ?Dolly Rias RPh ?11/24/2021, 11:31 PM ? ?

## 2021-11-24 NOTE — Progress Notes (Addendum)
? ?NAME:  Justin Trujillo., MRN:  [id number], DOB:  March 09, 1946, LOS: 2 ?ADMISSION DATE:  12/14/2021, CONSULTATION DATE:  11/19/2021 ?REFERRING MD: Eulis Foster - EDP CHIEF COMPLAINT: Anaphylactic shock ? ?History of Present Illness:  ?76 year old man with history of metastatic adenocarcinoma of distal esophagus, hypertension, COPD, OSA.  Diagnosed with esophageal cancer in June 2022 on treatment with FOLFOX and nivolumab.  Brought to the [place] long ED from the cancer center after he received his infusions with abdominal pain, hypotension, hypoxia and dyspnea. Initial concern for anaphylactic reaction from his infusion and given IV fluids, epinephrine injection, Pepcid and IV Solu-Medrol 125 mg.  Later clinical picture more consistent with sepsis, likely 2/2 PNA.  ? ?Remained hypotensive and PCCM called for admission. ? ?Pertinent Medical History:  ?Esophageal Cancer - dx in 02/2021, on FOLFOX + nivolumab (Cycle 12) ?CAD ?HTN  ?HLD  ?COPD  ?OSA ?DM II  ? ?Significant Hospital Events: ?Including procedures, antibiotic start and stop dates in addition to other pertinent events   ?3/8 Admit post suspected anaphylaxis with chemo.  COVID/Flu negative.  ?3/9 On BiPAP, 78mg norepi, heparin infusion ?3/10 Increased pressor requirement with Versed gtt initiation (NE max 40), vasopressin initiated and NE downtitrated to 15. Poor PaO2 despite significant vent support with desats/ven dyssynchrony with stimulation. NMB + proning. ? ?Interim History / Subjective:  ?Required significant uptitration of pressors overnight ?Levophed titrated to max dose 437m on initiation of Versed, given significant hypotension ?Vasopressin initiated ?Pressor requirements improved this morning, NE 15 mcg ?Continues to require vent support ?Persistent poor oxygenation/hypoxia with stimulation as well as vent dyssynchrony ?Plan for prone and NMB today ? ?Objective:  ?Blood pressure 94/64, pulse 97, temperature 97.8 ?F (36.6 ?C), temperature source Axillary,  resp. rate (!) 28, height _0  (1.676 m), weight 74.1 kg, SpO2 93 %. ?   ?Vent Mode: PRVC ?FiO2 (%):  [90 %-100 %] 90 % ?Set Rate:  [18 bmp-28 bmp] 28 bmp ?Vt Set:  [380 mL] 380 mL ?PEEP:  [10 cmH20-12 cmH20] 12 cmH20 ?Plateau Pressure:  [23 cmH20-36 cmH20] 29 cmH20  ? ?Intake/Output Summary (Last 24 hours) at 11/24/2021 [id number] data filed at 11/24/2021 [id number] per 24 hour  ?Intake 3781.79 ml  ?Output 300 ml  ?Net 3481.79 ml  ? ? ?Filed Weights  ? 11/24/2021 1830 11/23/21 0500 11/24/21 0359  ?Weight: 71 kg 71 kg 74.1 kg  ? ?Physical Examination: ?General: Chronically ill-appearing elderly man in NAD. Intubated, sedated. ?HEENT: [place]/AT, anicteric sclera, pupils equal and pinpoint 1-23m74mreactive to light bilaterally, moist mucous membranes. ETT/OGT in place. ?Neuro: Sedated. Responds to noxious stimuli. and Withdraws to pain in all 4 extremities. Following commands intermittently. Moves all 4 extremities spontaneously. +Corneal, +Cough, and +Gag  ?CV: RRR, no m/g/r. ?PULM: Breathing even and unlabored on vent (PEEP 12, FiO2 90%) while resting; becomes dyssynchronous when stimulated. Lung fields with bilateral coarse rhonchi. ?GI: Soft, nontender, nondistended. Hypoactive bowel sounds. ?Extremities: Bilateral symmetric 1+ LE edema noted. ?Skin: Warm/dry, scattered ecchymosis to bilateral UE/upper chest. ? ?Resolved Hospital Problem list   ? ? ?Assessment & Plan:  ? ?Undifferentiated shock, septic versus anaphylactic POA ?Lactic acidosis ?Initially presumed to be anaphylaxis, though he has received FOLFOX and nivolumab many times before 3/8 admit (cycle 12). Had fever to 102.4, must also consider septic given bilateral infiltrates on CXR, immune compromise and port in place ?- Goal MAP > 65 ?- Levophed titrated to goal MAP ?- Continue vasopressin ?- Random cortisol WNL, less likely component of  AI ?- Trend LA, WBC ?- Continue bicarb gtt ?- S/p empiric anaphylaxis treatment (Epi, Benadryl, Pepcid) ?- Continue  steroids ?- Continue empiric cefepime, vanc ?- F/u finalized Cx data (UCx, BCx, BAL/resp Cx) ? ?Acute Respiratory Failure with Hypoxia, POA ?Bilateral Consolidation in [place], Pulmonary Edema (on CT) ?OSA (not on CPAP) ?COPD ?- Continue full vent support (4-8cc/kg IBW) ?- Goal Pplat< 30, driving pressure < 15 cm H2O ?- Titrate PEEP/FiO2 per ARDS protocol  ?- Prone therapy x 16 hours per day, given poor P/F ratio and worsening hypoxia with any stimulation ?- NMB initiated 3/10 ?- Daily WUA/SBT not clinically appropriate at this time with NMB/deep sedation on board ?- Deaccess port prior to proning ?- VAP bundle ?- Bronchidilators PRN ?- Pulmonary hygiene ?- PAD protocol for sedation: Fentanyl and Versed for goal RASS -5 ? ?Paroxysmal Atrial Fibrillation ?- Heparin gtt per pharmacy ?- Hold home Eliquis/PO amiodarone ?- Cardiac monitoring ? ?Hyperglycemia ?DM II  ?- TF coverage agged Q4H 3/10 ?- SSI decreased to sensitive scale ?- Continue CBGs Q4H ? ?AKI, POA ?Hyponatremia  ?- Trend BMP ?- Replete electrolytes as indicated ?- Monitor I&Os ?- Avoid nephrotoxic agents as able ?- Ensure adequate renal perfusion ? ?Moderate Protein Calorie Malnutrition (POA) ?Albumin 2-2.9 ?- Appreciate Nutrition/RD assistance ?- NPO ?- TF initiated ? ?Best Practice (right click and "Reselect all SmartList Selections" daily)  ?Diet/type: NPO ?DVT prophylaxis: systemic heparin ?GI prophylaxis: PPI ?Lines: Central line and Arterial Line ?Foley:  Yes, and it is still needed ?Code Status:  full code ?Last date of multidisciplinary goals of care discussion.  11/21/2021.  Discussed goals of care with wife at bedside.  She wants full code for now with time-limited trial of intubation and support.  ? ?Critical care time: 50 minutes   ? ?Lestine Mount, PA-C ?[facility] ?11/24/21 8:12 AM ? ?Please see [email or web address] for pager details. ? ?From 7A-7P if no response, please call [phone] ?After hours, please call ELink  [phone] ?

## 2021-11-24 NOTE — Progress Notes (Signed)
ANTICOAGULATION CONSULT NOTE  ? ?Pharmacy Consult for heparin ?Indication: atrial fibrillation ? ?Allergies  ?Allergen Reactions  ? Iodinated Contrast Media Anaphylaxis  ? Moxifloxacin Swelling  ?  REACTION: GI upset, throat "tightened up"  ? Penicillins Anaphylaxis  ?  Remote anaphylaxis reaction (not childhood) requiring MD/hospital care ?- tolerates Ceftin, Rocephin, Cefepime ?  ? Shellfish-Derived Products Anaphylaxis  ? Amoxicillin Swelling  ?  Throat tightened up ?Tolerates Ceftin, Rocephin, Cefepime  ? Chlorhexidine Other (See Comments)  ?  Wife is not aware of this allergy  ? Latex Rash  ?  Reaction to prolonged exposure  ? ? ?Patient Measurements: ?Height: '5\' 6"'$  (167.6 cm) ?Weight: 74.1 kg (163 lb 5.8 oz) ?IBW/kg (Calculated) : 63.8 ?Heparin Dosing Weight: 70.8 kg ? ?Vital Signs: ?Temp: 97.8 ?F (36.6 ?C) (03/10 0324) ?Temp Source: Axillary (03/10 0324) ?BP: 94/64 (03/10 0000) ?Pulse Rate: 97 (03/10 0400) ? ?Labs: ?Recent Labs  ?  12/11/2021 ?1456 11/16/2021 ?1456 12/12/2021 ?1721 12/07/2021 ?1742 12/15/2021 ?2034 11/23/21 ?0237 11/23/21 ?1006 11/23/21 ?1647 11/24/21 ?0302  ?HGB 12.2*  --   --   --   --  11.8*  --   --  11.0*  ?HCT 38.1*  --   --   --   --  37.3*  --   --  33.4*  ?PLT 257  --   --   --   --  237  --   --  202  ?APTT  --    < >  --  28  --  86* 152*  --  139*  ?LABPROT  --   --   --  16.4*  --   --   --   --   --   ?INR  --   --   --  1.3*  --   --   --   --   --   ?HEPARINUNFRC  --   --   --  0.96*  --  1.04*  --   --  0.81*  ?CREATININE 0.66  --   --   --   --  0.87  --  1.04 1.18  ?TROPONINIHS  --   --  19*  --  27*  --   --   --   --   ? < > = values in this interval not displayed.  ? ? ? ?Estimated Creatinine Clearance: 48.1 mL/min (by C-G formula based on SCr of 1.18 mg/dL). ? ?Assessment: ?76 year old male presented to the ED from cancer center with possible anaphylactic reaction to chemotherapy infusion. He required non-rebreather and vasopressor support. He is to be admitted to the ICU.  Patient with history of atrial fibrillation on Eliquis PTA. Pharmacy consulted to manage heparin. Given patient acuity on presentation to ED, suspect last dose of Eliquis 3/8 morning prior to appointment at cancer center. ? ?11/24/21 ?Heparin level = 0.81 (remains falsely elevated due to PTA apixaban, dose heparin using aptt until correlation with heparin levels) ?aPTT  139 sec (supra-therapeutic) on heparin 800 units/hr ?CBC: Hgb 11, Pltc wnl ?No bleeding or interruptions per RN ?Intubated on 3/9 AM, no interruptions or pauses noted.  No bleeding or complications.   ? ?Goal of Therapy:  ?Heparin level 0.3-0.7 units/ml ?aPTT 66-102 seconds ?Monitor platelets by anticoagulation protocol: Yes ?  ?Plan:  ?- HOLD heparin for 1 hour ?- Decrease to heparin infusion at 650 units/hr ?- Recheck aPTT in 8 hours ?- CBC & heparin level daily while on heparin infusion ? ? ?  Dolly Rias RPh ?11/24/2021, 5:02 AM ? ? ? ? ?

## 2021-11-24 NOTE — Progress Notes (Signed)
Pts head turned to right side with RN without any complications.  ?

## 2021-11-24 NOTE — Progress Notes (Signed)
Chaplain responded to request from RN for family support as patient's condition is not good.  Wife, Justin Trujillo and daughter Justin Trujillo present at bedside.  Chaplain provided space for family to share what led up to the hospitalization.  Patient had come for Chemo on Wednesday and was ready to go when he had some sort of event.  Spouse has been present the past 2 nights.  Family lives in [facility] area.  They are part of a [employer].  Spouse and patient have been married 35 years next month.  Spouse shared about how they met.  They have multiple grandchildren and love their family.  Spouse is hopeful, but uncertain as to what will happen.  Other family switched out and Chaplain provided prayer.  Chaplain available as needed. ?[place], [place]. ? ? ? 11/24/21 1853  ?Clinical Encounter Type  ?Visited With Patient and family together;Health care provider  ?Visit Type Initial;Spiritual support;Critical Care  ?Referral From Nurse  ?Consult/Referral To Chaplain  ?Spiritual Encounters  ?Spiritual Needs Prayer;Emotional  ? ? ?

## 2021-11-24 NOTE — Progress Notes (Signed)
Pts head turned to left side with RN without any complications.  ?

## 2021-11-25 ENCOUNTER — Inpatient Hospital Stay (HOSPITAL_COMMUNITY): Payer: PPO

## 2021-11-25 DIAGNOSIS — J80 Acute respiratory distress syndrome: Secondary | ICD-10-CM | POA: Diagnosis not present

## 2021-11-25 DIAGNOSIS — R579 Shock, unspecified: Secondary | ICD-10-CM | POA: Diagnosis not present

## 2021-11-25 LAB — BASIC METABOLIC PANEL
Anion gap: 8 (ref 5–15)
BUN: 35 mg/dL — ABNORMAL HIGH (ref 8–23)
CO2: 26 mmol/L (ref 22–32)
Calcium: 6.8 mg/dL — ABNORMAL LOW (ref 8.9–10.3)
Chloride: 95 mmol/L — ABNORMAL LOW (ref 98–111)
Creatinine, Ser: 0.81 mg/dL (ref 0.61–1.24)
GFR, Estimated: >60 mL/min (ref >60)
Glucose, Bld: 110 mg/dL — ABNORMAL HIGH (ref 70–99)
Potassium: 3.4 mmol/L — ABNORMAL LOW (ref 3.5–5.1)
Sodium: 129 mmol/L — ABNORMAL LOW (ref 135–145)

## 2021-11-25 LAB — BLOOD GAS, ARTERIAL
Acid-Base Excess: 5.4 mmol/L — ABNORMAL HIGH (ref 0.0–2.0)
Acid-Base Excess: 4.7 mmol/L — ABNORMAL HIGH (ref 0.0–2.0)
Bicarbonate: 28.7 mmol/L — ABNORMAL HIGH (ref 20.0–28.0)
Bicarbonate: 29.8 mmol/L — ABNORMAL HIGH (ref 20.0–28.0)
FIO2: 70 %
MECHVT: 380 mL
O2 Saturation: 100 %
O2 Saturation: 96.6 %
PEEP: 12 cmH2O
Patient temperature: 36
Patient temperature: 37
RATE: 20 resp/min
pCO2 arterial: 34 mmHg (ref 32–48)
pCO2 arterial: 46 mmHg (ref 32–48)
pH, Arterial: 7.53 — ABNORMAL HIGH (ref 7.35–7.45)
pH, Arterial: 7.42 (ref 7.35–7.45)
pO2, Arterial: 151 mmHg — ABNORMAL HIGH (ref 83–108)
pO2, Arterial: 77 mmHg — ABNORMAL LOW (ref 83–108)

## 2021-11-25 LAB — CULTURE, RESPIRATORY W GRAM STAIN
Culture: NO GROWTH
Culture: NO GROWTH

## 2021-11-25 LAB — CBC
HCT: 29.5 % — ABNORMAL LOW (ref 39.0–52.0)
Hemoglobin: 10 g/dL — ABNORMAL LOW (ref 13.0–17.0)
MCH: 32.3 pg (ref 26.0–34.0)
MCHC: 33.9 g/dL (ref 30.0–36.0)
MCV: 95.2 fL (ref 80.0–100.0)
Platelets: 169 10*3/uL (ref 150–400)
RBC: 3.1 MIL/uL — ABNORMAL LOW (ref 4.22–5.81)
RDW: 15.8 % — ABNORMAL HIGH (ref 11.5–15.5)
WBC: 24.8 10*3/uL — ABNORMAL HIGH (ref 4.0–10.5)
nRBC: 0 % (ref 0.0–0.2)

## 2021-11-25 LAB — GLUCOSE, CAPILLARY
Glucose-Capillary: 96 mg/dL (ref 70–99)
Glucose-Capillary: 116 mg/dL — ABNORMAL HIGH (ref 70–99)
Glucose-Capillary: 92 mg/dL (ref 70–99)
Glucose-Capillary: 153 mg/dL — ABNORMAL HIGH (ref 70–99)
Glucose-Capillary: 165 mg/dL — ABNORMAL HIGH (ref 70–99)

## 2021-11-25 LAB — HEPARIN LEVEL (UNFRACTIONATED)
Heparin Unfractionated: 0.25 IU/mL — ABNORMAL LOW (ref 0.30–0.70)
Heparin Unfractionated: <0.1 IU/mL — ABNORMAL LOW (ref 0.30–0.70)

## 2021-11-25 LAB — APTT
aPTT: 30 seconds (ref 24–36)
aPTT: 30 seconds (ref 24–36)

## 2021-11-25 LAB — PROCALCITONIN: Procalcitonin: 34.7 ng/mL

## 2021-11-25 LAB — STREP PNEUMONIAE URINARY ANTIGEN: Strep Pneumo Urinary Antigen: NEGATIVE

## 2021-11-25 LAB — MRSA NEXT GEN BY PCR, NASAL: MRSA by PCR Next Gen: NOT DETECTED

## 2021-11-25 MED ORDER — PROSOURCE TF PO LIQD
45.0000 mL | Freq: Four times a day (QID) | ORAL | Status: DC
  Administered 2021-11-25 – 2021-11-28 (×15): 45 mL
  Filled 2021-11-25 (×14): qty 45

## 2021-11-25 MED ORDER — ADULT MULTIVITAMIN LIQUID CH
15.0000 mL | Freq: Every day | ORAL | Status: DC
  Administered 2021-11-26 – 2021-11-27 (×2): 15 mL
  Filled 2021-11-25 (×2): qty 15

## 2021-11-25 MED ORDER — VITAL 1.5 CAL PO LIQD
1000.0000 mL | ORAL | Status: DC
  Administered 2021-11-25 – 2021-11-28 (×4): 1000 mL
  Filled 2021-11-25 (×5): qty 1000

## 2021-11-25 MED ORDER — POTASSIUM CHLORIDE 20 MEQ PO PACK: 40.0000 meq | PACK | Freq: Once | ORAL | Status: DC

## 2021-11-25 MED ORDER — SODIUM CHLORIDE 0.9 % IV SOLN
2.0000 g | Freq: Three times a day (TID) | INTRAVENOUS | Status: DC
  Administered 2021-11-25 – 2021-11-29 (×12): 2 g via INTRAVENOUS
  Filled 2021-11-25 (×11): qty 2

## 2021-11-25 MED ORDER — JUVEN PO PACK
1.0000 | PACK | Freq: Two times a day (BID) | ORAL | Status: DC
Start: 2021-11-25 — End: 2021-11-29
  Administered 2021-11-25 – 2021-11-28 (×7): 1
  Filled 2021-11-25 (×7): qty 1

## 2021-11-25 MED ORDER — HEPARIN (PORCINE) 25000 UT/250ML-% IV SOLN
1100.0000 [IU]/h | INTRAVENOUS | Status: DC
  Administered 2021-11-26: 1100 [IU]/h via INTRAVENOUS
  Filled 2021-11-25: qty 250

## 2021-11-25 MED ORDER — FREE WATER
30.0000 mL | Status: DC
  Administered 2021-11-25 – 2021-11-26 (×5): 30 mL

## 2021-11-25 MED ORDER — VANCOMYCIN HCL 1500 MG/300ML IV SOLN
1500.0000 mg | INTRAVENOUS | Status: DC
  Administered 2021-11-25 – 2021-11-28 (×4): 1500 mg via INTRAVENOUS
  Filled 2021-11-25 (×4): qty 300

## 2021-11-25 MED ORDER — POTASSIUM CHLORIDE 10 MEQ/50ML IV SOLN
10.0000 meq | INTRAVENOUS | Status: AC
  Administered 2021-11-25 (×4): 10 meq via INTRAVENOUS
  Filled 2021-11-25 (×4): qty 50

## 2021-11-25 MED ORDER — HEPARIN BOLUS VIA INFUSION
2000.0000 [IU] | Freq: Once | INTRAVENOUS | Status: AC
  Administered 2021-11-25: 2000 [IU] via INTRAVENOUS
  Filled 2021-11-25: qty 2000

## 2021-11-25 MED ORDER — HEPARIN BOLUS VIA INFUSION
2000.0000 [IU] | INTRAVENOUS | Status: AC
  Administered 2021-11-25: 2000 [IU] via INTRAVENOUS
  Filled 2021-11-25: qty 2000

## 2021-11-25 NOTE — Progress Notes (Signed)
Nutrition Follow-up ? ?DOCUMENTATION CODES:  ? ?Non-severe (moderate) malnutrition in context of chronic illness ? ?INTERVENTION:  ? ?Vital 1.5@ 48ml/hr + ProSource TF 77ml QID via tube ? ?Free water flushes 64ml q4 hours to maintain tube patency  ? ?Regimen provides 1600kcal/day, 109g/day protein and 947ml/day of free water  ? ?Liquid MVI daily via tube  ? ?Juven Fruit Punch BID via tube, each serving provides 95kcal and 2.5g of protein (amino acids glutamine and arginine) ? ?NUTRITION DIAGNOSIS:  ? ?Moderate Malnutrition related to chronic illness, cancer and cancer related treatments as evidenced by mild fat depletion, moderate muscle depletion. ?-ongoing ? ?GOAL:  ? ?Provide needs based on ASPEN/SCCM guidelines ?-not met  ? ?MONITOR:  ? ?Vent status, Labs, Weight trends, TF tolerance, Skin, I & O's ? ?ASSESSMENT:  ? 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 ? ?Pt remains sedated and ventilated. OGT in place. Spoke with RN, tube feeds currently on hold as pt is being proned. Recommended for tube feeds to be resumed as majority of patients tolerate gastric feeds well during proning. Will change pt over to Vital tube feed formula while pt is being proned as this is more easily digestible. Per chart, pt is up ~17lbs since admission. Pt +8.0L on his I & Os.  ? ?Medications reviewed and include: colace, heparin, insulin, solu medrol, miralax, protonix, NaCl $RemoveBefore'@50ml'YVfCnAtJagKHx$ /hr, cefepime, heparin, levophed, Kcl, vancomycin  ? ?Labs reviewed: Na 129(L), K 3.4(L), BUN 35(H) ?P 3.7 wnl, Mg 2.5(H)- 3/10 ?Wbc- 24.8(H), Hgb 10.0(L), Hct 29.5(L) ?Cbgs- 116, 96, 90, 168 x 48 hrs ? ?Patient is currently intubated on ventilator support ?MV: 7.4 L/min ?Temp (24hrs), Avg:97.2 ?F (36.2 ?C), Min:96.5 ?F (35.8 ?C), Max:97.8 ?F  (36.6 ?C) ? ?Propofol: none  ? ?MAP- >76mmHg  ? ?UOP- 1520ml ? ?Diet Order:   ?Diet Order   ? ?       ?  Diet NPO time specified  Diet effective now       ?  ? ?  ?  ? ?  ? ?EDUCATION NEEDS:  ? ?No education needs have been identified at this time ? ?Skin:  Skin Assessment: Skin Integrity Issues: ?Skin Integrity Issues:: Stage I, Other (Comment) ?Stage I: sacrum ?Other: MASD bilateral groin ? ?Last BM:  3/11- type 5 ? ?Height:  ? ?Ht Readings from Last 1 Encounters:  ?12/01/2021 $RemoveBe'5\' 6"'WICwHRCpu$  (1.676 m)  ? ? ?Weight:  ? ?Wt Readings from Last 1 Encounters:  ?11/25/21 78.5 kg  ? ? ?Ideal Body Weight:  64.5 kg ? ?BMI:  Body mass index is 27.93 kg/m?. ? ?Estimated Nutritional Needs:  ? ?Kcal:  1462kcal/day ? ?Protein:  105-120g/day ? ?Fluid:  1.7-1.9L/day ? ?Koleen Distance MS, RD, LDN ?Please refer to [facility] for RD and/or RD on-call/weekend/after hours pager ? ?

## 2021-11-25 NOTE — Progress Notes (Signed)
Unproned 1010 ?

## 2021-11-25 NOTE — Consult Note (Signed)
Consultation Note Date: 11/25/2021   Patient Name: Justin Trujillo.  DOB: 1945/11/22  MRN: [id number]  Age / Sex: 76 y.o., male  PCP: Carol Ada, MD Referring Physician: Brand Males, MD  Reason for Consultation: Establishing goals of care  HPI/Patient Profile: 76 y.o. male  with past medical history of metastatic adenocarcinoma of the distal esophagus, hypertension, COPD, OSA admitted on 12/03/2021 after developing abdominal pain and concern for anaphylaxis after receiving FOLFOX and nivolumab at the cancer center.  He has had this regimen multiple times before without problems but was transferred to the ED and had continued decline with respiratory failure, fever, and hypotension.  He was subsequently intubated and consideration this may be more of a sepsis picture with underlying pneumonia.  Palliative consulted for goals of care.  Clinical Assessment and Goals of Care: I met today with Mr. Niblack wife, daughter, son, and daughter-in-law.   I introduced palliative care as specialized medical care for people living with serious illness. It focuses on providing relief from the symptoms and stress of a serious illness. The goal is to improve quality of life for both the patient and the family.  We discussed the things most important to Mr. Prim.  His wife reports he was a very social man and his family and faith in the most important things to him.  This includes 4 children (3 biological children and 1 daughter whom his wife had prior to them getting married).  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  His hobbies included racing, cars, and he enjoyed country music.  We discussed clinical course as well as wishes moving forward in regard to advanced directives.  Concepts specific to code status and his care plan this hospitalization discussed.  Values and goals of care important to  patient and family were attempted to be elicited.   Questions and concerns addressed.   PMT will continue to support holistically.  SUMMARY OF RECOMMENDATIONS   -Full code/full scope -Family to review patient's prior outlined wishes in his advanced directive and discuss as a family regarding potential limitations of care.  He is currently intubated and on pressor support.  We specifically discussed CPR in the event of cardiac arrest as being a potential limitation of care.  At this time, they remain invested in continuation of aggressive interventions with the hope he would improve over the next couple of days.  Code Status/Advance Care Planning: Full code  Prognosis:  Guarded  Discharge Planning: To Be Determined      Primary Diagnoses: Present on Admission:  Shock ([place])  Controlled type 2 diabetes mellitus with complication, without long-term current use of insulin ([place])   I have reviewed the medical record, interviewed the patient and family, and examined the patient. The following aspects are pertinent.  Past Medical History:  Diagnosis Date   Arthritis    Cancer [facility])    Coronary atherosclerosis of native coronary artery    Depression    Esophageal cancer ([place])  Essential tremor    High cholesterol    Hypertension    Hypogonadism male    Obstructive chronic bronchitis with exacerbation (HCC)    Obstructive sleep apnea (adult) (pediatric)    no cpap   Other and unspecified hyperlipidemia    Other emphysema ([place])    Proteinuria 2019   has seen a nephrologist   Situational stress    Type II or unspecified type diabetes mellitus without mention of complication, not stated as uncontrolled    type 2   Social History   Socioeconomic History   Marital status: Married    Spouse name: Not on file   Number of children: Not on file   Years of education: Not on file   Highest education level: Not on file  Occupational History   Occupation: Financial planner  Tobacco Use   Smoking status: Every Day    Packs/day: 1.00    Years: 55.00    Pack years: 55.00    Types: Cigarettes   Smokeless tobacco: Never  Vaping Use   Vaping Use: Never used  Substance and Sexual Activity   Alcohol use: Yes    Alcohol/week: 1.0 standard drink    Types: 1 Cans of beer per week    Comment: occasional beer   Drug use: Never   Sexual activity: Not Currently  Other Topics Concern   Not on file  Social History Narrative   ** Merged History Encounter **       Social Determinants of Health   Financial Resource Strain: Not on file  Food Insecurity: Not on file  Transportation Needs: Not on file  Physical Activity: Not on file  Stress: Not on file  Social Connections: Not on file   Family History  Problem Relation Age of Onset   Emphysema Father    Heart disease Father    Clotting disorder Mother    Kidney cancer Child    Scheduled Meds:  artificial tears  1 application. Both Eyes Q8H   docusate  100 mg Per Tube BID   fentaNYL (SUBLIMAZE) injection  50 mcg Intravenous Once   Gerhardt's butt cream   Topical BID   heparin lock flush  500 Units Intracatheter Q30 days   insulin aspart  0-9 Units Subcutaneous Q4H   insulin aspart  3 Units Subcutaneous Q4H   mouth rinse  15 mL Mouth Rinse 10 times per day   methylPREDNISolone (SOLU-MEDROL) injection  40 mg Intravenous Q12H   pantoprazole (PROTONIX) IV  40 mg Intravenous Daily   polyethylene glycol  17 g Per Tube Daily   sodium chloride flush  10-40 mL Intracatheter Q12H   Continuous Infusions:  sodium chloride Stopped (11/23/2021 2022)   sodium chloride 50 mL/hr at 11/25/21 0907   ceFEPime (MAXIPIME) IV Stopped (11/24/21 2304)   cisatracurium (NIMBEX) infusion 2 mg/mL 3 mcg/kg/min (11/25/21 0907)   feeding supplement (OSMOLITE 1.5 CAL) Stopped (11/24/21 1710)   fentaNYL infusion INTRAVENOUS 150 mcg/hr (11/25/21 0907)   heparin 700 Units/hr (11/24/21 2339)   midazolam 4 mg/hr (11/25/21 0907)    norepinephrine (LEVOPHED) Adult infusion 1 mcg/min (11/25/21 2355)   potassium chloride 10 mEq (11/25/21 0908)   vancomycin Stopped (11/25/21 0113)   vasopressin Stopped (11/24/21 1819)   PRN Meds:.sodium chloride, acetaminophen **OR** acetaminophen, albuterol, docusate sodium, fentaNYL, heparin lock flush **AND** heparin lock flush, midazolam, polyethylene glycol, sodium chloride flush Medications Prior to Admission:  Prior to Admission medications   Medication Sig Start Date End Date Taking? Authorizing  Provider  acetaminophen (TYLENOL) 325 MG tablet Take 2 tablets (650 mg total) by mouth every 6 (six) hours as needed for mild pain (or Fever >/= 101). 10/30/21  Yes Sheikh, Omair Latif, DO  albuterol (PROVENTIL) (2.5 MG/3ML) 0.083% nebulizer solution Take 2.5 mg by nebulization every 6 (six) hours. 10/12/21  Yes [provider]  albuterol (VENTOLIN HFA) 108 (90 Base) MCG/ACT inhaler Inhale 1 puff into the lungs every 6 (six) hours as needed for wheezing. 04/24/21  Yes [provider]  ALPRAZolam Duanne Moron) 0.5 MG tablet Take 0.5 mg by mouth 2 (two) times daily.   Yes [provider]  amiodarone (PACERONE) 200 MG tablet Take 2 tablets (400 mg total) by mouth 2 (two) times daily for 7 days, THEN 1 tablet (200 mg total) daily. Patient taking differently: Take one tablet (200 mg) by mouth every morning 08/01/21 08/08/22 Yes Evans Lance, MD  atorvastatin (LIPITOR) 40 MG tablet Take 1 tablet (40 mg total) by mouth daily. Patient taking differently: Take 40 mg by mouth at bedtime. 07/23/12  Yes Weber, Damaris Hippo, PA-C  cholecalciferol (VITAMIN D3) 25 MCG (1000 UNIT) tablet Take 1,000 Units by mouth daily.   Yes [provider]  escitalopram (LEXAPRO) 10 MG tablet Take 10 mg by mouth every morning.   Yes [provider]  gabapentin (NEURONTIN) 300 MG capsule Take 300 mg by mouth See admin instructions. Take 300 mg every 4-6 hours. Take with tylenol, dilaudid, and  tizanidine - scheduled 10/19/21  Yes [provider]  HYDROmorphone (DILAUDID) 8 MG tablet Take 8 mg by mouth every 6 (six) hours. Take with tylenol, gabapentin and tizanidine - scheduled 10/02/21  Yes [provider]  magnesium oxide (MAG-OX) 400 (240 Mg) MG tablet Take 1 tablet (400 mg total) by mouth daily. 10/31/21  Yes Sheikh, Omair Latif, DO  MYRBETRIQ 50 MG TB24 tablet Take 50 mg by mouth at bedtime. 12/26/20  Yes [provider]  Nutritional Supplements (BOOST 100 CALORIE SMART) LIQD Take 237 mLs by mouth 2 (two) times daily.   Yes [provider]  pantoprazole (PROTONIX) 40 MG tablet Take 40 mg by mouth 2 (two) times daily.   Yes [provider]  potassium chloride SA (KLOR-CON M) 20 MEQ tablet Take 40 mEq by mouth daily.   Yes [provider]  primidone (MYSOLINE) 50 MG tablet Take 1 tablet (50 mg total) by mouth in the morning and at bedtime. 06/21/21  Yes Tat, Eustace Quail, DO  rOPINIRole (REQUIP) 0.5 MG tablet Take 0.5 mg by mouth at bedtime.   Yes [provider]  Semaglutide,0.25 or 0.5MG /DOS, (OZEMPIC, 0.25 OR 0.5 MG/DOSE,) 2 MG/1.5ML SOPN Inject 0.5 mg into the skin every Wednesday.   Yes [provider]  tamsulosin (FLOMAX) 0.4 MG CAPS capsule Take 0.4 mg by mouth 2 (two) times daily. 01/03/21  Yes [provider]  tiZANidine (ZANAFLEX) 4 MG tablet Take 4 mg by mouth 3 (three) times daily. Take with tylenol, dilaudid and gabapentin - scheduled 01/23/21  Yes [provider]  vitamin B-12 (CYANOCOBALAMIN) 500 MCG tablet Take 500 mcg by mouth daily.   Yes [provider]  apixaban (ELIQUIS) 5 MG TABS tablet Take 1 tablet (5 mg total) by mouth 2 (two) times daily. 11/09/21   Evans Lance, MD  feeding supplement, GLUCERNA SHAKE, (GLUCERNA SHAKE) LIQD Take 237 mLs by mouth 3 (three) times daily between meals. Patient not taking: Reported on 11/23/2021 10/30/21   Kerney Elbe, DO  ondansetron  (ZOFRAN ODT) 4 MG disintegrating tablet Take 1 tablet (4 mg total) by mouth every 8 (eight) hours as needed for nausea or vomiting. Patient not taking: Reported on 11/07/2021 07/21/21   Jonetta Osgood, MD  ondansetron (ZOFRAN) 4 MG tablet Take 1 tablet (4 mg total) by mouth every 6 (six) hours as needed for nausea. Patient not taking: Reported on 11/23/2021 10/30/21   Raiford Noble Latif, DO  sodium chloride (OCEAN) 0.65 % SOLN nasal spray Place 1 spray into both nostrils as needed for congestion. Patient not taking: Reported on 11/07/2021 10/30/21   Raiford Noble Latif, DO  sucralfate (CARAFATE) 1 GM/10ML suspension Take 10 mLs (1 g total) by mouth 4 (four) times daily -  with meals and at bedtime. Patient not taking: Reported on 09/12/2021 07/21/21   Jonetta Osgood, MD  testosterone cypionate (DEPOTESTOSTERONE CYPIONATE) 200 MG/ML injection Inject 200 mg into the muscle every 14 (fourteen) days. Patient not taking: Reported on 11/07/2021    [provider]   Allergies  Allergen Reactions   Iodinated Contrast Media Anaphylaxis   Moxifloxacin Swelling    REACTION: GI upset, throat "tightened up"   Penicillins Anaphylaxis    Remote anaphylaxis reaction (not childhood) requiring MD/hospital care - tolerates Ceftin, Rocephin, Cefepime    Shellfish-Derived Products Anaphylaxis   Amoxicillin Swelling    Throat tightened up Tolerates Ceftin, Rocephin, Cefepime   Chlorhexidine Other (See Comments)    Wife is not aware of this allergy   Latex Rash    Reaction to prolonged exposure   Review of Systems Unable to obtain: Intubated and sedated  Physical Exam General: Intubated and sedated HEENT: No bruits, no goiter, no JVD, ET tube in place Heart: Regular rate and rhythm. No murmur appreciated. Abdomen: Soft, nontender, nondistended, positive bowel sounds.   Skin: Warm and dry  Vital Signs: BP 104/63    Pulse 87    Temp 97.8 F (36.6 C) (Axillary)    Resp 18    Ht $R'5\' 6"'ck$  (1.676 m)     Wt 78.5 kg    SpO2 100%    BMI 27.93 kg/m  Pain Scale: CPOT   Pain Score: 0-No pain   SpO2: SpO2: 100 % O2 Device:SpO2: 100 % O2 Flow Rate: .O2 Flow Rate (L/min): 40 L/min  IO: Intake/output summary:  Intake/Output Summary (Last 24 hours) at 11/25/2021 5974 Last data filed at 11/25/2021 1638 Gross per 24 hour  Intake 3942.87 ml  Output 1560 ml  Net 2382.87 ml    LBM: Last BM Date :  (PTA) Baseline Weight: Weight: 71 kg Most recent weight: Weight: 78.5 kg     Palliative Assessment/Data:   Flowsheet Rows    Flowsheet Row Most Recent Value  Intake Tab   Referral Department Critical care  Unit at Time of Referral ICU  Palliative Care Primary Diagnosis Cancer  Date Notified 11/23/2021  Clinical Assessment   Psychosocial & Spiritual Assessment   Palliative Care Outcomes        Time In: 1600 Time Out: 1725 Time Total: 85 Greater than 50%  of this time was spent counseling and coordinating care related to the above assessment and plan.  Signed by: Micheline Rough, MD   Please contact Palliative Medicine Team phone at [phone] for questions and concerns.  For individual provider: See Shea Evans

## 2021-11-25 NOTE — Progress Notes (Signed)
Patient un-proned at 1010 with assist from RT and 5 additional RNs. Patient tolerated well, with ETT and OG measurements unchanged. VS stable. STAT CXR obtained per protocol. This RN will continue to carefully monitor pt.  ?

## 2021-11-25 NOTE — Progress Notes (Signed)
Upon attempting to turn patients neck to the right during proning positioning we have found it increasingly more difficult to rotate the neck without causing and obstruction to the ETT. When rotating the neck, it feels like it is almost having to be strained into an unnatural position. Crepitus like sensation felt during this time, and patients blood pressure increases drastically. BIS is still reading less than 60. RT is unable to pass inline suction, after attempt to reposition the head several times. Patient skin is friable and he has received skin tears to both arms each time we have repositioned. Neck returned to left positioning, but arms were still repositioned on schedule. It is noted that patient has had both shoulders replaced surgically, so I am unsure of previous mobility of neck. Will attempt to reposition head with next turn.  ?

## 2021-11-25 NOTE — Progress Notes (Addendum)
eLink Physician-Brief Progress Note ?Patient Name: Justin Trujillo. ?DOB: September 03, 1946 ?MRN: [id number] ? ? ?Date of Service ? 11/25/2021  ?HPI/Events of Note ? Notified of plan to prone the patient. Has been proning 16 hours and supinating 8 hours. Current o2 sat is 97, on 90% fio2/peep 12 . Is sedated, paralyazed and in no distress on camera. On low dose levophed and on vaso. Seen on camera .   ?eICU Interventions ? RN gathering team to prone the patient as planned. Call E link if needed.   ? ? ? ?Intervention Category ?Major Interventions: Respiratory failure - evaluation and management ? ?Tramond Slinker G Carlesha Seiple ?11/25/2021, 10:49 PM ? ?Addendum at 12:15 am ?Seen in prone position ?O2 sat 96 ?Fio2 down to 80, peep 12. Continue prone and wean as tolerated  ?

## 2021-11-25 NOTE — Progress Notes (Signed)
Upon turning pts neck to the right pts ETT tube is obstructing. Pt's neck is very stiff on that side. Attempted to fix the pillow and position of pt without any success. Pt's neck turned back to left side ETT tube is much better and pt's neck is in more comfortable position. RN aware.  ?

## 2021-11-25 NOTE — Progress Notes (Signed)
K+ 3.4 Replaced per protocol  

## 2021-11-25 NOTE — Progress Notes (Signed)
NAME:  Justin Trujillo., MRN:  [id number], DOB:  1945-11-23, LOS: 3 ADMISSION DATE:  12/10/2021, CONSULTATION DATE:  11/24/2021 REFERRING MD: Eulis Foster - EDP CHIEF COMPLAINT: Anaphylactic shock  BRIEF  76 year old man with history of metastatic adenocarcinoma of distal esophagus, hypertension, COPD, OSA.  Diagnosed with esophageal cancer in June 2022 on treatment with FOLFOX and nivolumab.  Brought to the [facility] from the cancer center after he received his infusions with abdominal pain, hypotension, hypoxia and dyspnea. Initial concern for anaphylactic reaction from his infusion and given IV fluids, epinephrine injection, Pepcid and IV Solu-Medrol 125 mg.  Later clinical picture more consistent with sepsis, likely 2/2 PNA.   Remained hypotensive and PCCM called for admission.  Pertinent Medical History:  Esophageal Cancer - dx in 02/2021, on FOLFOX + nivolumab (Cycle 12) CAD HTN  HLD  COPD  OSA DM II   Significant Hospital Events: Including procedures, antibiotic start and stop dates in addition to other pertinent events   3/8 Admit post suspected anaphylaxis with chemo.  COVID/Flu negative.  3/9 On BiPAP, 25mg norepi, heparin infusion 3/10 Increased pressor requirement with Versed gtt initiation (NE max 40), vasopressin initiated and NE downtitrated to 15. Poor PaO2 despite significant vent support with desats/ven dyssynchrony with stimulation. NMB + proning.  Interim History / Subjective:    11/25/21 -worsening procalcitonin to 35.  So far culture negative.  Afebrile since 11/23/2021 with improving white count to 24.8K.  currently on prone-supine cycles.  FiO2 improved to 70%.  Continues on fentanyl infusion Versed infusion Levophed infusion and Nimbex infusion.  Has ongoing tube feeds. Imrpoved pressor needes   Objective:  Blood pressure 104/63, pulse 89, temperature 97.8 F (36.6 C), temperature source Axillary, resp. rate 20, height _0  (1.676 m), weight 78.5 kg, SpO2 100 %.     Vent Mode: PRVC FiO2 (%):  [70 %-100 %] 70 % Set Rate:  [20 bmp-28 bmp] 20 bmp Vt Set:  [380 mL] 380 mL PEEP:  [12 cmH20] 12 cmH20 Plateau Pressure:  [28 cmH20-30 cmH20] 30 cmH20   Intake/Output Summary (Last 24 hours) at 11/25/2021 0829 Last data filed at 11/25/2021 0809 Gross per 24 hour  Intake 3809.52 ml  Output 1560 ml  Net 2249.52 ml   Filed Weights   11/23/21 0500 11/24/21 0359 11/25/21 0409  Weight: 71 kg 74.1 kg 78.5 kg   Physical Examination: General Appearance:  Looks criticall ill OBESE - yes. PRONE Head:  Normocephalic, without obvious abnormality, atraumatic Eyes:  PERRL - cannot eval conjunctiva/corneas - cannot eval     Ears:  Normal external ear canals, both ears Nose:  G tube - no Throat:  ETT TUBE - yes , OG tube - yes Neck:  Supple,  No enlargement/tenderness/nodules Lungs: Clear to auscultation bilaterally, Ventilator   Synchrony - yes 70% Heart:  S1 and S2 normal, no murmur, CVP - no.  Pressors - yes Abdomen:  Soft, no masses, no organomegaly Genitalia / Rectal:  Not done Extremities:  Extremities- intact Skin:  ntact in exposed areas . Sacral area - no decube Neurologic:  Sedation - fent/versed/niimbex -> RASS - -5 . Moves all 4s - no. CAM-ICU - no . Orientation - no     Resolved Hospital Problem list     Assessment & Plan:   Undifferentiated shock, septic versus anaphylactic POA present on admission Lactic acidosis Initially presumed to be anaphylaxis, though he has received FOLFOX and nivolumab many times before 3/8 admit (cycle 12). Had fever  to 102.4, must also consider septic given bilateral infiltrates on CXRnd super high PRocalctionin immune compromise and port in place  11/25/21 -persistent septic shock but improving significantly  Plan -- Goal MAP > 65 - Levophed titrated to goal MAP - Continue vasopressin  Severe sepsis likely source  pneumonia -present on admission  11/25/2021: Procalcitonin worse compared to admission in the 30s.   Fever has improved in the last 48 hours..  So far culture negative   Plan - Continue empiric cefepime, vanc - F/u finalized Cx data (UCx, BCx, BAL/resp Cx) -Repeat bronchoalveolar tracheal aspirate 11/25/2021   OSA (not on CPAP) -present on admission baseline COPD = not otherwise specified baseline prior to and present on admission Acute Respiratory Failure with Hypoxia, -present on admission, Bilateral Consolidation in [place], Pulmonary Edema (on CT)  = Evolved into ARDS and status post intubation 11/23/2021.  Started on Nimbex in prone/supine cycle 11/24/2021 -Though at risk for nivolumab pneumonitis clinical profile suggest area septic shock  11/25/2021: Currently prone.  Only marginal improvement in oxygenation FiO2 70%.  ESR 31  Plan - Continue full vent support (4-8cc/kg IBW) - Goal Pplat< 30, driving pressure < 15 cm H2O - Titrate PEEP/FiO2 per ARDS protocol  - Prone therapy x 16 hours per day, given poor P/F ratio and worsening hypoxia with any stimulation - NMB initiated 3/10 - Daily WUA/SBT not clinically appropriate at this time with NMB/deep sedation on board - Deaccess port prior to proning - VAP bundle - Bronchidilators PRN - Pulmonary hygiene - PAD protocol for sedation: Fentanyl and Versed for goal RASS -5 -Continue Solu-Medrol originally started for concern for anaphylaxis [might have to stop this]   Paroxysmal Atrial Fibrillation - Heparin gtt per pharmacy - Hold home Eliquis/PO amiodarone - Cardiac monitoring  Hyperglycemia DM II  - TF coverage agged Q4H 3/10 - SSI decreased to sensitive scale - Continue CBGs Q4H  Hyponatremia  - Trend BMP - Replete electrolytes as indicated - Monitor I&Os - Avoid nephrotoxic agents as able - Ensure adequate renal perfusion  Moderate Protein Calorie Malnutrition (POA) Albumin 2-2.9 - Appreciate Nutrition/RD assistance - NPO - TF initiated  Best Practice (right click and "Reselect all SmartList Selections"  daily)  Diet/type: NPO DVT prophylaxis: systemic heparin GI prophylaxis: PPI Lines: Central line and Arterial Line Foley:  Yes, and it is still needed Code Status:  full code Last date of multidisciplinary goals of care discussion.  11/18/2021.  Discussed goals of care with wife at bedside.  She wants full code for now with time-limited trial of intubation and support.  On 11/24/2021: CCM MD updated wife and 2 children.  Palliative care also met with them.  They want to confer with all 4 siblings and mom/patient wife before making a decision.  Poor prognosis expressed.     ATTESTATION & SIGNATURE   The patient Tracker Mance. is critically ill with multiple organ systems failure and requires high complexity decision making for assessment and support, frequent evaluation and titration of therapies, application of advanced monitoring technologies and extensive interpretation of multiple databases.   Critical Care Time devoted to patient care services described in this note is  40  Minutes. This time reflects time of care of this signee Dr Brand Males. This critical care time does not reflect procedure time, or teaching time or supervisory time of PA/NP/Med student/Med Resident etc but could involve care discussion time     Dr. Brand Males, M.D., [facility].C.P Pulmonary and Critical Care Medicine  Staff Physician [facility] Pager: [phone], If no answer or between  15:00h - 7:00h: call [phone]  11/25/2021 8:33 AM    LABS    PULMONARY Recent Labs  Lab 11/24/21 0302 11/24/21 1154 11/24/21 1621 11/24/21 1826 11/25/21 0354  PHART 7.32* 7.4 7.46* 7.42 7.53*  PCO2ART 42 43 42 45 34  PO2ART 74* 66* 68* 130* 151*  HCO3 21.6 26.6 29.9* 29.2* 28.7*  O2SAT 93.4 93.7 97.1 100 100    CBC Recent Labs  Lab 11/23/21 0237 11/24/21 0302 11/25/21 0354  HGB 11.8* 11.0* 10.0*  HCT 37.3* 33.4* 29.5*  WBC 28.4* 29.6* 24.8*  PLT  237 202 169    COAGULATION Recent Labs  Lab 11/28/2021 1742  INR 1.3*    CARDIAC  No results for input(s): TROPONINI in the last 168 hours. No results for input(s): PROBNP in the last 168 hours.   CHEMISTRY Recent Labs  Lab 11/20/2021 1456 11/23/21 0237 11/23/21 1326 11/23/21 1647 11/24/21 0302 11/24/21 1839 11/25/21 0354  NA 131* 129*  --  128* 128*  --  129*  K 3.4* 4.2  --  4.2 4.2  --  3.4*  CL 98 99  --  96* 97*  --  95*  CO2 22 20*  --  21* 22  --  26  GLUCOSE 154* 140*  --  233* 243*  --  110*  BUN 10 14  --  25* 34*  --  35*  CREATININE 0.66 0.87  --  1.04 1.18  --  0.81  CALCIUM 8.6* 7.9*  --  7.0* 6.8*  --  6.8*  MG  --  1.9 2.5* 2.4 2.4 2.5*  --   PHOS  --   --  5.6* 5.8* 5.2* 3.7  --    Estimated Creatinine Clearance: 76.5 mL/min (by C-G formula based on SCr of 0.81 mg/dL).   LIVER Recent Labs  Lab 11/16/2021 0753 11/16/2021 1742 11/24/21 0302  AST 19  --  53*  ALT 12  --  22  ALKPHOS 137*  --  98  BILITOT 0.5  --  0.5  PROT 5.8*  --  4.7*  ALBUMIN 2.9*  --  1.8*  INR  --  1.3*  --      INFECTIOUS Recent Labs  Lab 12/04/2021 1721 11/19/2021 2034 11/23/21 1647 11/24/21 1103 11/24/21 1303 11/24/21 1839 11/25/21 0354  LATICACIDVEN 2.1*   < > 4.2* 2.8* 2.8*  --   --   PROCALCITON 14.68  --   --   --   --  39.71 34.70   < > = values in this interval not displayed.     ENDOCRINE CBG (last 3)  Recent Labs    11/24/21 2312 11/25/21 0313 11/25/21 0751  GLUCAP 90 96 116*         IMAGING x48h  - image(s) personally visualized  -   highlighted in bold DG Chest Port 1 View  Result Date: 11/24/2021 CLINICAL DATA:  ET tube positioning EXAM: PORTABLE CHEST 1 VIEW COMPARISON:  11/24/2021 FINDINGS: Endotracheal tube is 5.5 cm above the carina. Right Port-A-Cath tip is at the cavoatrial junction. Left central line tip is in the SVC. NG tube is in the stomach. Severe bilateral airspace disease, not significantly changed since prior study. Heart is  normal size. No visible effusions or pneumothorax. IMPRESSION: Support devices in expected position as above. Severe diffuse bilateral airspace disease, unchanged. Electronically Signed  By: Rolm Baptise M.D.   On: 11/24/2021 19:38   Portable Chest xray  Result Date: 11/24/2021 CLINICAL DATA:  Ventilator dependent respiratory failure. EXAM: PORTABLE CHEST 1 VIEW COMPARISON:  Portable chest yesterday at 6:02 p.m. FINDINGS: NGT tip is probably in the body of stomach based on the position of the proximal side hole with the tip not included in the exam. ETT tip is 4 cm from the carina with left IJ line tip at the cavoatrial junction level and similar tip position of a right chest port catheter. There is aortic atherosclerosis, stable mediastinum. Mild cardiomegaly. Central vessels are obscured. Dense bilateral airspace disease in the lower 2/3 of both lungs has worsened, and today extends to involve the periphery of both lungs, previously was perihilar-predominant and not quite as confluent, with increasing small bilateral pleural effusions. The apical lungs remain clear. Bilateral shoulder replacements are again noted. Osteopenia. IMPRESSION: 1. Bilateral worsening dense airspace disease right-greater-than-left, consistent with edema and/or pneumonia with small increased pleural effusions. 2. In all other respects no further changes.  Stable cardiac size. Electronically Signed   By: Telford Nab M.D.   On: 11/24/2021 07:24   DG CHEST PORT 1 VIEW  Result Date: 11/23/2021 CLINICAL DATA:  Central line placement EXAM: PORTABLE CHEST 1 VIEW COMPARISON:  11/23/2021 at 9:51 a.m. FINDINGS: Right Port-A-Cath tip: SVC. Left internal jugular central venous catheter tip: SVC. Endotracheal tube tip: 3.4 cm above the carina. Nasogastric tube: Enters the stomach. No pneumothorax. Continued dense perihilar airspace opacities with some obscuration of the hemidiaphragms. Bilateral shoulder arthroplasties. Bony  demineralization. Atherosclerotic calcification of the aortic arch. IMPRESSION: 1. The new left IJ central venous catheter tip projects over the SVC. No pneumothorax. 2. New nasogastric tube enters the stomach and likely terminates in the stomach based on the position of the side port. 3. Stable dense perihilar and basilar airspace opacities. 4. The rest of the support apparatus appears satisfactorily positioned. Electronically Signed   By: Van Clines M.D.   On: 11/23/2021 18:10   Portable Chest x-ray  Result Date: 11/23/2021 CLINICAL DATA:  Endotracheal tube placement. EXAM: PORTABLE CHEST 1 VIEW COMPARISON:  One-view chest x-ray 11/23/2021 FINDINGS: Heart is enlarged. Diffuse interstitial and airspace opacities are similar the prior study. Bilateral pleural effusions are present. Right IJ Port-A-Cath is in place. Patient now intubated. The endotracheal tube terminates 5 cm above the carina, in satisfactory position. Bilateral shoulder replacements noted. IMPRESSION: 1. Interval intubation. Satisfactory positioning of the endotracheal tube. 2. Stable diffuse interstitial and airspace opacities compatible with edema or infection. 3. Bilateral pleural effusions. Electronically Signed   By: San Morelle M.D.   On: 11/23/2021 10:27   DG Abd Portable 1V  Result Date: 11/23/2021 CLINICAL DATA:  Placement enteric tube EXAM: PORTABLE ABDOMEN - 1 VIEW COMPARISON:  11/04/2015 FINDINGS: Tip of enteric tube is seen in the stomach. Bowel gas pattern in the upper abdomen is unremarkable. Heart is enlarged in size. There is prominence of pulmonary vessels along with interstitial and alveolar densities in both lungs, possibly suggesting pulmonary edema. Dextroscoliosis and degenerative changes are noted in lumbar spine. There is surgical fusion in the lumbar spine at L3-L4 level. IMPRESSION: Tip of enteric tube is seen in the stomach. Electronically Signed   By: Elmer Picker M.D.   On: 11/23/2021 13:38

## 2021-11-25 NOTE — Progress Notes (Signed)
Pts head not turned at this time. Pts head is still on the left side and he is tolerating it well no distress.  ?

## 2021-11-25 NOTE — Progress Notes (Addendum)
ANTICOAGULATION CONSULT NOTE  ? ?Pharmacy Consult for heparin ?Indication: atrial fibrillation ? ?Allergies  ?Allergen Reactions  ? Iodinated Contrast Media Anaphylaxis  ? Moxifloxacin Swelling  ?  REACTION: GI upset, throat "tightened up"  ? Penicillins Anaphylaxis  ?  Remote anaphylaxis reaction (not childhood) requiring MD/hospital care ?- tolerates Ceftin, Rocephin, Cefepime ?  ? Shellfish-Derived Products Anaphylaxis  ? Amoxicillin Swelling  ?  Throat tightened up ?Tolerates Ceftin, Rocephin, Cefepime  ? Chlorhexidine Other (See Comments)  ?  Wife is not aware of this allergy  ? Latex Rash  ?  Reaction to prolonged exposure  ? ? ?Patient Measurements: ?Height: '5\' 6"'$  (167.6 cm) ?Weight: 78.5 kg (173 lb 1 oz) ?IBW/kg (Calculated) : 63.8 ?Heparin Dosing Weight: 70.8 kg ? ?Vital Signs: ?Temp: 96.9 ?F (36.1 ?C) (03/11 1600) ?Temp Source: Axillary (03/11 1600) ?Pulse Rate: 94 (03/11 1900) ? ?Labs: ?Recent Labs  ?   ?0000 12/04/2021 ?2034 11/23/21 ?0237 11/23/21 ?1006 11/23/21 ?1647 11/24/21 ?0302 11/24/21 ?1400 11/24/21 ?2238 11/25/21 ?0354 11/25/21 ?0569 11/25/21 ?1828  ?HGB   < >  --  11.8*  --   --  11.0*  --   --  10.0*  --   --   ?HCT  --   --  37.3*  --   --  33.4*  --   --  29.5*  --   --   ?PLT  --   --  237  --   --  202  --   --  169  --   --   ?APTT  --   --  86*   < >  --  139* 90* 64*  --  30 30  ?HEPARINUNFRC   < >  --  1.04*  --   --  0.81* 0.45  --  0.25*  --   --   ?CREATININE   < >  --  0.87  --  1.04 1.18  --   --  0.81  --   --   ?TROPONINIHS  --  27*  --   --   --   --   --   --   --   --   --   ? < > = values in this interval not displayed.  ? ? ? ?Estimated Creatinine Clearance: 76.5 mL/min (by C-G formula based on SCr of 0.81 mg/dL). ? ?Assessment: ?76 year old male presented to the ED from cancer center with possible anaphylactic reaction to chemotherapy infusion. He required non-rebreather and vasopressor support. He is to be admitted to the ICU. Patient with history of atrial fibrillation on  Eliquis PTA. Pharmacy consulted to manage heparin. Given patient acuity on presentation to ED, suspect last dose of Eliquis 3/8 morning prior to appointment at cancer center. ? ?11/25/21 ?Heparin level <0.10 and aPTT 30 sec, both subtherapeutic on heparin at 900 units/hr ?aPTT and heparin level appear to be correlating ?CBC: Hgb 10, Pltc wnl ?No bleeding (skin tears but bleeding not significant) noted ?Discussed with RN - in Epic ~1700 heparin rate documented at 39m/hr and says "not infusing at this set rate at time of assessment" > RN did not receive report of heparin line being held and is currently infusing. Believe this to be documented in error and not actual line being held.  ? ?Goal of Therapy:  ?Heparin level 0.3-0.7 units/ml ?aPTT 66-102 seconds ?Monitor platelets by anticoagulation protocol: Yes ?  ?Plan:  ?- Give 2000 units IV Heparin bolus now and increase  heparin infusion rate to 1100 units/hr ?- D/c aPTT checks ?- Recheck HL in 8 hours ?- CBC & heparin level daily while on heparin infusion ? ?Dimple Nanas, PharmD ?11/25/2021 7:17 PM ? ? ? ?

## 2021-11-25 NOTE — Progress Notes (Signed)
ANTICOAGULATION CONSULT NOTE  ? ?Pharmacy Consult for heparin ?Indication: atrial fibrillation ? ?Allergies  ?Allergen Reactions  ? Iodinated Contrast Media Anaphylaxis  ? Moxifloxacin Swelling  ?  REACTION: GI upset, throat "tightened up"  ? Penicillins Anaphylaxis  ?  Remote anaphylaxis reaction (not childhood) requiring MD/hospital care ?- tolerates Ceftin, Rocephin, Cefepime ?  ? Shellfish-Derived Products Anaphylaxis  ? Amoxicillin Swelling  ?  Throat tightened up ?Tolerates Ceftin, Rocephin, Cefepime  ? Chlorhexidine Other (See Comments)  ?  Wife is not aware of this allergy  ? Latex Rash  ?  Reaction to prolonged exposure  ? ? ?Patient Measurements: ?Height: '5\' 6"'$  (167.6 cm) ?Weight: 78.5 kg (173 lb 1 oz) ?IBW/kg (Calculated) : 63.8 ?Heparin Dosing Weight: 70.8 kg ? ?Vital Signs: ?Temp: 97.8 ?F (36.6 ?C) (03/11 0545) ?Temp Source: Axillary (03/11 0545) ?BP: 104/63 (03/11 0415) ?Pulse Rate: 93 (03/11 0930) ? ?Labs: ?Recent Labs  ?  11/28/2021 ?1456 11/18/2021 ?1721 12/13/2021 ?1742 12/14/2021 ?2034 11/23/21 ?0237 11/23/21 ?1006 11/23/21 ?1647 11/24/21 ?0302 11/24/21 ?1400 11/24/21 ?2238 11/25/21 ?0354 11/25/21 ?3810  ?HGB  --   --   --   --  11.8*  --   --  11.0*  --   --  10.0*  --   ?HCT  --   --   --   --  37.3*  --   --  33.4*  --   --  29.5*  --   ?PLT  --   --   --   --  237  --   --  202  --   --  169  --   ?APTT   < >  --  28  --  86*   < >  --  139* 90* 64*  --  30  ?LABPROT  --   --  16.4*  --   --   --   --   --   --   --   --   --   ?INR  --   --  1.3*  --   --   --   --   --   --   --   --   --   ?HEPARINUNFRC   < >  --  0.96*  --  1.04*  --   --  0.81* 0.45  --  0.25*  --   ?CREATININE  --   --   --   --  0.87  --  1.04 1.18  --   --  0.81  --   ?TROPONINIHS  --  19*  --  27*  --   --   --   --   --   --   --   --   ? < > = values in this interval not displayed.  ? ? ? ?Estimated Creatinine Clearance: 76.5 mL/min (by C-G formula based on SCr of 0.81 mg/dL). ? ?Assessment: ?76 year old male presented to  the ED from cancer center with possible anaphylactic reaction to chemotherapy infusion. He required non-rebreather and vasopressor support. He is to be admitted to the ICU. Patient with history of atrial fibrillation on Eliquis PTA. Pharmacy consulted to manage heparin. Given patient acuity on presentation to ED, suspect last dose of Eliquis 3/8 morning prior to appointment at cancer center. ? ?11/25/21 ?Heparin level = 0.25 (trending down and appears to be close to correlating with aPTT) ?aPTT  30 sec, subtherapeutic on heparin at 700 units/hr ?CBC: Hgb  10, Pltc wnl ?No bleeding (skin tears but bleeding not significant) or interruptions per nurse ? ?Goal of Therapy:  ?Heparin level 0.3-0.7 units/ml ?aPTT 66-102 seconds ?Monitor platelets by anticoagulation protocol: Yes ?  ?Plan:  ?- Give 2000 units IV heparin bolus and increase heparin infusion to 900 units/hr ?- Recheck aPTT in 8 hours and tomorrow morning ?- CBC & heparin level daily while on heparin infusion ? ?Royetta Asal, PharmD, BCPS ?Clinical Pharmacist ?[place] ?Please utilize Amion for appropriate phone number to reach the unit pharmacist ([place]) ?11/25/2021 9:55 AM ? ? ? ? ? ? ?

## 2021-11-25 NOTE — Progress Notes (Signed)
Pharmacy Antibiotic Note ? ?Justin Trujillo. is a 76 y.o. male admitted on 12/09/2021 with sepsis and Pharmacy was consulted for cefepime and vancomycin dosing. ? ?Today, patient is afebrile, WBC remains elevated, serum creatinine down to 0.81.  MRSA PCR collected on 3/8 but not resulted. ? ?Plan: ?Change cefepime from 2 g IV q12h to 2 g IV q8h ?Change vancomycin back to 1500 mg IV q24h (Goal AUC: 400-550. Expected AUC: 424.6. SCr used: 0.81) ?Monitor clinical progress, renal function, vancomycin level if indicated ?Reorder MRSA PCR to guide therapy ?F/U C&S, abx deescalation / LOT ? ? ?Height: _0  (167.6 cm) ?Weight: 78.5 kg (173 lb 1 oz) ?IBW/kg (Calculated) : 63.8 ? ?Temp (24hrs), Avg:97.2 ?F (36.2 ?C), Min:96.5 ?F (35.8 ?C), Max:97.8 ?F (36.6 ?C) ? ?Recent Labs  ?Lab 11/16/2021 ?0753 12/01/2021 ?1456 11/21/2021 ?1721 12/07/2021 ?2034 11/23/21 ?0237 11/23/21 ?1647 11/24/21 ?0302 11/24/21 ?1103 11/24/21 ?1303 11/25/21 ?0354  ?WBC 11.2* 9.0  --   --  28.4*  --  29.6*  --   --  24.8*  ?CREATININE 0.57* 0.66  --   --  0.87 1.04 1.18  --   --  0.81  ?LATICACIDVEN  --   --  2.1* 2.4*  --  4.2*  --  2.8* 2.8*  --   ?  ?Estimated Creatinine Clearance: 76.5 mL/min (by C-G formula based on SCr of 0.81 mg/dL).   ? ?Allergies  ?Allergen Reactions  ? Iodinated Contrast Media Anaphylaxis  ? Moxifloxacin Swelling  ?  REACTION: GI upset, throat "tightened up"  ? Penicillins Anaphylaxis  ?  Remote anaphylaxis reaction (not childhood) requiring MD/hospital care ?- tolerates Ceftin, Rocephin, Cefepime ?  ? Shellfish-Derived Products Anaphylaxis  ? Amoxicillin Swelling  ?  Throat tightened up ?Tolerates Ceftin, Rocephin, Cefepime  ? Chlorhexidine Other (See Comments)  ?  Wife is not aware of this allergy  ? Latex Rash  ?  Reaction to prolonged exposure  ? ? ?Antimicrobials this admission: ?3/8 vancomycin >>  ?3/8 cefepime >>  ? ?Dose adjustments this admission: ?3/8 vancomycin 1500 mg q24h + cefepime 2 g q8h ?3/9 vancomycin 1250 mg q24h +  cefepime 2 g q12h ?3/11 vancomycin 1500 mg q24h + cefepime 2 g q8h ? ?Microbiology results: ?3/11 BCx: NGTD ?3/9 UCx: insignificant growth  ?3/9 respiratory from bronchoalveolar lavage: ngtd ?3/8 MRSA PCR: not resulted ?3/9 AFB: neg ?3/9 Acid Fast culture: sent ?3/9 fungus culture with stain: sent ?3/9 pneumocystis smea: neg ?3/11 MRSA PCR: ordered ? ?Thank you for allowing pharmacy to be a part of this patient?s care. ? ?Royetta Asal, PharmD, BCPS ?Clinical Pharmacist ?[name] ?Please utilize Amion for appropriate phone number to reach the unit pharmacist ([place]) ?11/25/2021 10:20 AM ? ? ?

## 2021-11-26 ENCOUNTER — Other Ambulatory Visit: Payer: Self-pay

## 2021-11-26 ENCOUNTER — Inpatient Hospital Stay (HOSPITAL_COMMUNITY): Payer: PPO

## 2021-11-26 DIAGNOSIS — J9601 Acute respiratory failure with hypoxia: Secondary | ICD-10-CM | POA: Diagnosis not present

## 2021-11-26 DIAGNOSIS — Z7189 Other specified counseling: Secondary | ICD-10-CM | POA: Diagnosis not present

## 2021-11-26 DIAGNOSIS — R579 Shock, unspecified: Secondary | ICD-10-CM | POA: Diagnosis not present

## 2021-11-26 DIAGNOSIS — J80 Acute respiratory distress syndrome: Secondary | ICD-10-CM | POA: Diagnosis not present

## 2021-11-26 LAB — CBC WITH DIFFERENTIAL/PLATELET
Abs Immature Granulocytes: 0.2 10*3/uL — ABNORMAL HIGH (ref 0.00–0.07)
Basophils Absolute: 0 10*3/uL (ref 0.0–0.1)
Basophils Relative: 0 %
Eosinophils Absolute: 0.1 10*3/uL (ref 0.0–0.5)
Eosinophils Relative: 0 %
HCT: 32.5 % — ABNORMAL LOW (ref 39.0–52.0)
Hemoglobin: 10.3 g/dL — ABNORMAL LOW (ref 13.0–17.0)
Immature Granulocytes: 1 %
Lymphocytes Relative: 4 %
Lymphs Abs: 0.7 10*3/uL (ref 0.7–4.0)
MCH: 32.2 pg (ref 26.0–34.0)
MCHC: 31.7 g/dL (ref 30.0–36.0)
MCV: 101.6 fL — ABNORMAL HIGH (ref 80.0–100.0)
Monocytes Absolute: 0.7 10*3/uL (ref 0.1–1.0)
Monocytes Relative: 3 %
Neutro Abs: 19 10*3/uL — ABNORMAL HIGH (ref 1.7–7.7)
Neutrophils Relative %: 92 %
Platelets: 101 10*3/uL — ABNORMAL LOW (ref 150–400)
RBC: 3.2 MIL/uL — ABNORMAL LOW (ref 4.22–5.81)
RDW: 16 % — ABNORMAL HIGH (ref 11.5–15.5)
WBC: 20.7 10*3/uL — ABNORMAL HIGH (ref 4.0–10.5)
nRBC: 0 % (ref 0.0–0.2)

## 2021-11-26 LAB — COMPREHENSIVE METABOLIC PANEL
ALT: 19 U/L (ref 0–44)
AST: 34 U/L (ref 15–41)
Albumin: 1.8 g/dL — ABNORMAL LOW (ref 3.5–5.0)
Alkaline Phosphatase: 112 U/L (ref 38–126)
Anion gap: 7 (ref 5–15)
BUN: 43 mg/dL — ABNORMAL HIGH (ref 8–23)
CO2: 26 mmol/L (ref 22–32)
Calcium: 7.4 mg/dL — ABNORMAL LOW (ref 8.9–10.3)
Chloride: 99 mmol/L (ref 98–111)
Creatinine, Ser: 0.79 mg/dL (ref 0.61–1.24)
GFR, Estimated: >60 mL/min (ref >60)
Glucose, Bld: 198 mg/dL — ABNORMAL HIGH (ref 70–99)
Potassium: 4 mmol/L (ref 3.5–5.1)
Sodium: 132 mmol/L — ABNORMAL LOW (ref 135–145)
Total Bilirubin: 0.5 mg/dL (ref 0.3–1.2)
Total Protein: 4.4 g/dL — ABNORMAL LOW (ref 6.5–8.1)

## 2021-11-26 LAB — CBC
HCT: 31.4 % — ABNORMAL LOW (ref 39.0–52.0)
Hemoglobin: 10.1 g/dL — ABNORMAL LOW (ref 13.0–17.0)
MCH: 32.2 pg (ref 26.0–34.0)
MCHC: 32.2 g/dL (ref 30.0–36.0)
MCV: 100 fL (ref 80.0–100.0)
Platelets: 123 10*3/uL — ABNORMAL LOW (ref 150–400)
RBC: 3.14 MIL/uL — ABNORMAL LOW (ref 4.22–5.81)
RDW: 16 % — ABNORMAL HIGH (ref 11.5–15.5)
WBC: 20.8 10*3/uL — ABNORMAL HIGH (ref 4.0–10.5)
nRBC: 0 % (ref 0.0–0.2)

## 2021-11-26 LAB — BLOOD GAS, ARTERIAL
Acid-Base Excess: 0.7 mmol/L (ref 0.0–2.0)
Acid-Base Excess: 2.6 mmol/L — ABNORMAL HIGH (ref 0.0–2.0)
Bicarbonate: 27.8 mmol/L (ref 20.0–28.0)
Bicarbonate: 30 mmol/L — ABNORMAL HIGH (ref 20.0–28.0)
O2 Saturation: 98.2 %
O2 Saturation: 99.4 %
Patient temperature: 36.8
Patient temperature: 36.8
pCO2 arterial: 54 mmHg — ABNORMAL HIGH (ref 32–48)
pCO2 arterial: 60 mmHg — ABNORMAL HIGH (ref 32–48)
pH, Arterial: 7.32 — ABNORMAL LOW (ref 7.35–7.45)
pH, Arterial: 7.3 — ABNORMAL LOW (ref 7.35–7.45)
pO2, Arterial: 120 mmHg — ABNORMAL HIGH (ref 83–108)
pO2, Arterial: 84 mmHg (ref 83–108)

## 2021-11-26 LAB — MAGNESIUM: Magnesium: 2.4 mg/dL (ref 1.7–2.4)

## 2021-11-26 LAB — GLUCOSE, CAPILLARY
Glucose-Capillary: 189 mg/dL — ABNORMAL HIGH (ref 70–99)
Glucose-Capillary: 197 mg/dL — ABNORMAL HIGH (ref 70–99)
Glucose-Capillary: 198 mg/dL — ABNORMAL HIGH (ref 70–99)
Glucose-Capillary: 200 mg/dL — ABNORMAL HIGH (ref 70–99)
Glucose-Capillary: 208 mg/dL — ABNORMAL HIGH (ref 70–99)
Glucose-Capillary: 208 mg/dL — ABNORMAL HIGH (ref 70–99)

## 2021-11-26 LAB — TROPONIN I (HIGH SENSITIVITY): Troponin I (High Sensitivity): 36 ng/L — ABNORMAL HIGH (ref <18)

## 2021-11-26 LAB — LACTIC ACID, PLASMA: Lactic Acid, Venous: 2.6 mmol/L (ref 0.5–1.9)

## 2021-11-26 LAB — HEPARIN LEVEL (UNFRACTIONATED)
Heparin Unfractionated: 0.58 IU/mL (ref 0.30–0.70)
Heparin Unfractionated: 0.51 IU/mL (ref 0.30–0.70)

## 2021-11-26 LAB — PROCALCITONIN: Procalcitonin: 19.94 ng/mL

## 2021-11-26 MED ORDER — POTASSIUM CHLORIDE 20 MEQ PO PACK
40.0000 meq | PACK | Freq: Two times a day (BID) | ORAL | Status: DC
  Administered 2021-11-26 (×2): 40 meq
  Filled 2021-11-26 (×3): qty 2

## 2021-11-26 MED ORDER — FUROSEMIDE 10 MG/ML IJ SOLN
40.0000 mg | Freq: Three times a day (TID) | INTRAMUSCULAR | Status: DC
  Administered 2021-11-26 – 2021-11-27 (×4): 40 mg via INTRAVENOUS
  Filled 2021-11-26 (×4): qty 4

## 2021-11-26 MED ORDER — PHENYLEPHRINE HCL-NACL 20-0.9 MG/250ML-% IV SOLN
0.0000 ug/min | INTRAVENOUS | Status: DC
  Administered 2021-11-26: 20 ug/min via INTRAVENOUS
  Administered 2021-11-27: 80 ug/min via INTRAVENOUS
  Administered 2021-11-27: 20 ug/min via INTRAVENOUS
  Administered 2021-11-28: 90 ug/min via INTRAVENOUS
  Administered 2021-11-28: 60 ug/min via INTRAVENOUS
  Administered 2021-11-28: 100 ug/min via INTRAVENOUS
  Administered 2021-11-28: 90 ug/min via INTRAVENOUS
  Administered 2021-11-28: 60 ug/min via INTRAVENOUS
  Filled 2021-11-26 (×9): qty 250

## 2021-11-26 MED ORDER — METHYLPREDNISOLONE SODIUM SUCC 40 MG IJ SOLR
40.0000 mg | INTRAMUSCULAR | Status: DC
  Administered 2021-11-26: 40 mg via INTRAVENOUS
  Filled 2021-11-26: qty 1

## 2021-11-26 NOTE — Progress Notes (Addendum)
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  Diagnosed with esophageal cancer in June 2022 on treatment with FOLFOX and nivolumab.  Brought to the [facility] ED from the cancer center after he received his infusions with abdominal pain, hypotension, hypoxia and dyspnea. Initial concern for anaphylactic reaction from his infusion and given IV fluids, epinephrine injection, Pepcid and IV Solu-Medrol 125 mg.  Later clinical picture more consistent with sepsis, likely 2/2 PNA. /ARDS. Remained hypotensive and PCCM called for admission.  At baseline prior to admission: Seen by oncologist on 11/28/2021.  Patient did describe fatigue and generalized weakness and chronic pain in the back anywhere from 3-7 out of 10, bruising of his extremities because of Eliquis.  There is a history of fall in February 2023 presumably related to low blood pressure.  Also wounds in his arms because of the fall.  He has had low albumin consistently initially mild since July-October 2022 and then severe since February 2023 [less than 2.1 g%] consistent with protein calorie malnutrition  Pertinent Medical History:  Esophageal Cancer - dx in 02/2021, on FOLFOX + nivolumab (Cycle 12) CAD HTN  HLD  COPD  OSA DM II    has a past medical history of Arthritis, Cancer ([place]), Coronary atherosclerosis of native coronary artery, Depression, Esophageal cancer ([place]), Essential tremor, High cholesterol, Hypertension, Hypogonadism male, Obstructive chronic bronchitis with exacerbation ([place]), Obstructive sleep apnea (adult) (pediatric), Other and unspecified hyperlipidemia, Other emphysema ([place]), Proteinuria (2019), Situational stress, and Type II or unspecified type  diabetes mellitus without mention of complication, not stated as uncontrolled.   has a past surgical history that includes L lens implant; vascectomy; R knee replacement x2; Tonsillectomy and adenoidectomy; IR KYPHO LUMBAR INC FX REDUCE BONE BX UNI/BIL CANNULATION INC/IMAGING (06/13/2018); Tonsillectomy; Cardiac catheterization; Reverse shoulder arthroplasty (Left, 03/26/2019); and IR IMAGING GUIDED PORT INSERTION (04/24/2021).   Significant Hospital Events: Including procedures, antibiotic start and stop dates in addition to other pertinent events   3/8 Admit post suspected anaphylaxis with chemo.  COVID/Flu negative -> DX ssepsis.  3/9 On BiPAP, 35mg norepi, heparin infusion -> INTUBATED 3/10 Increased pressor requirement with Versed gtt initiation (NE max 40), vasopressin initiated and NE downtitrated to 15. Poor PaO2 despite significant vent support with desats/ven dyssynchrony with stimulation. NMB + proning. In evening 16h prone /8 supine  11/25/21 -worsening procalcitonin to 35.  So far culture negative.  Afebrile since 11/23/2021 with improving white count to 24.8K.  currently on prone-supine cycles.  FiO2 improved to 70%.  Continues on fentanyl infusion Versed infusion Levophed infusion and Nimbex infusion.  Has ongoing tube feeds. Imrpoved pressor needes  Interim History / Subjective:  3/12 - on cycle #2 of prone/supine. Prone started only 23.30 last night. RN asking for prone cycles to coincide with 7pm shift change. Approachign 48h of nimbex. Came off pressors but then back on and now off. On fent gtt, versed gtt and nimbex gttt. Total volume overload is +9.9L since admit - also on free water.Afebrile, wbc down 20.8K, PCT down to 14s. Fio2 needs 60% and peep 12 and better  - per RN: while prone -> off pressors but back on when supine, Sedation and nimbex needs all better while prone    Objective:  Blood  pressure (!) 97/52, pulse 91, temperature 98.3 F (36.8 C), temperature source Axillary,  resp. rate 20, height _0  (1.676 m), weight 81.3 kg, SpO2 100 %.    Vent Mode: PRVC FiO2 (%):  [70 %-90 %] 70 % Set Rate:  [20 bmp] 20 bmp Vt Set:  [380 mL] 380 mL PEEP:  [12 cmH20] 12 cmH20 Plateau Pressure:  [17 cmH20-31 cmH20] 28 cmH20   Intake/Output Summary (Last 24 hours) at 11/26/2021 0752 Last data filed at 11/26/2021 0610 Gross per 24 hour  Intake 3501.74 ml  Output 1465 ml  Net 2036.74 ml   Filed Weights   11/24/21 0359 11/25/21 0409 11/26/21 0500  Weight: 74.1 kg 78.5 kg 81.3 kg     General Appearance:  Looks criticall ill OBESE - +. PRONE Head:  Normocephalic, without obvious abnormality, atraumatic Eyes:  PERRL - CANNOT EXAMINE, conjunctiva/corneas - CANNOT EXAMINE     Ears:  Normal external ear canals, both ears Nose:  G tube - yes Throat:  ETT TUBE - yes , OG tube - x Neck:  Supple,  No enlargement/tenderness/nodules Lungs: Clear to auscultation bilaterally, Ventilator   Synchrony - yes, 60% fip2., peep12 Heart:  S1 and S2 normal, no murmur, CVP - x.  Pressors - off Abdomen:  Soft, no masses, no organomegaly Genitalia / Rectal:  Not done Extremities:  Extremities- intact Skin:  ntact in exposed areas . Sacral area - no decub Neurologic:  Sedation - fent gtt, versed gtt, nimbex gtt -> RASS - BIS 41. TOF 2 of 4 few hours ago . Moves all 4s - none. CAM-ICU - cannot assess . Orientation - NO     Resolved Hospital Problem list     Assessment & Plan:  # OSA (not on CPAP) -present on admission baseline COPD = not otherwise specified -  baseline prior to and present on admission  # Acute Respiratory Failure with Hypoxia,  -present on admission, Bilateral Consolidation in [place], - etiology is pneumonia NOS  (at risk for nivolumab pneumonitis clinical profile suggest area septic shock   = Evolved into ARDS and status post intubation 11/23/2021.   - Started on Nimbex in prone/supine cycle 11/24/2021   11/26/2021: Currently prone #2.  Significnanmt  improvement in oxygenation FiO2 60%, peep 12.  ESR 31, does not meet criteria for spontaneous breathing trial because of severe ARDS and ongoing sedation and ventilator needs.  Plan - Continue full vent support (4-8cc/kg IBW)  - PRVC per ARDS  - Goal Pplat< 30, driving pressure < 15 cm H2O - Titrate PEEP/FiO2 per ARDS protocol  - Prone therapy x 16 hours per day, given poor P/F ratio and worsening hypoxia with any stimulation  -Can stop proning when FiO2 less than or equal to 60% and PEEP less than equal to 10 while supine - NMB initiated 3/10 -aim to taper or stop on 11/27/2021 if possible - Daily WUA/SBT not clinically appropriate at this time with NMB/deep sedation on board - VAP bundle - Bronchidilators PRN -Reduce Solu-Medrol originally started for concern for anaphylaxis   -Can aim to stop on 11/27/2021  #Sedation needs on the ventilator  11/26/2021: Deeply sedated with f fentanyl and Versed infusion.  Also paralyzed for ARDS  Plan - Bis goal less than 60 and RASS deep within -/-5 while on Nimbex and due to ARDS   Septic shock -present on admission [initially considered anaphylaxis but clinical profile more consistent with sepsis] Initially presumed to be anaphylaxis, though he has received FOLFOX  and nivolumab many times before 3/8 admit (cycle 12). Had fever to 102.4, must also consider septic given bilateral infiltrates on CXRnd super high PRocalctionin immune compromise and port in place  11/26/2021: Off pressors while supine  Plan -- Goal MAP > 65 -DC Levophed and vasopressin off the MAR  Infectious source of pneumonia -present on admission   11/26/2021: Pneumonia considered most likely etiology although so far culture negative.  Fever curve and procalcitonin improving with broad antibiotics   Plan - Continue empiric cefepime, vanc - F/u finalized Cx data (UCx, BCx, BAL/resp Cx) -Await repeat bronchoalveolar tracheal aspirate 11/25/2021  Lactic acodidos - Present on  Admit  3/13 -still persistently elevated mildly at 2.6  Plan  - monitor  -check CK 11/27/21   Paroxysmal Atrial Fibrillation -on Eliquis at home - Prior to & Present on Admit  3/13 - HR 91  Plan - Heparin gtt per pharmacy - Hold home Eliquis/PO amiodarone - Cardiac monitoring    Hyponatremia - chronic Nas 127-131 baseline - Prior to & Present on Admit Volume overload +9L since admit as of 11/27/21  3/13- Na 132  Plan - stop free water  - start lasix 57m tid   Anemia of chronic disease and chemo - (hg baseline 9-11gm% in feb 2023)  - Prior to & Present on Admit  3/13 - hgb 10.1gm%  Plan  - - PRBC for hgb </= 6.9gm%    - exceptions are   -  if ACS susepcted/confirmed then transfuse for hgb </= 8.0gm%,  or    -  active bleeding with hemodynamic instability, then transfuse regardless of hemoglobin value   At at all times try to transfuse 1 unit prbc as possible with exception of active hemorrhage  Thrombocytopenia  -intermittent baseline due to chemo - (plat 85-200  Jan  - mar 2023) - Prior to & Present on Admit - normal at admit 224K 12/06/2021  3/12 - plant 123K and new drop since admit  Plan  - monitor with heparin - check HITT panel - check duplex LE - stop heparin if plat drop further or testing positive   DM II  - Prior to & Present on Admit  Plan - TF coverage agged Q4H 3/10 - SSI decreased to sensitive scale - Continue CBGs Q4H  Moderate Protein Calorie Malnutrition (POA) Albumin 2-2.9 - Appreciate Nutrition/RD assistance - NPO - TF initiated  Best Practice (right click and "Reselect all SmartList Selections" daily)  Diet/type: tubefeeds DVT prophylaxis: systemic heparin GI prophylaxis: PPI Lines: Central line and Arterial Line Foley:  Yes, and it is still needed Code Status:  full code  Last date of multidisciplinary goals of care discussion.  12/04/2021.  Discussed goals of care with wife at bedside.  She wants full code for now with  time-limited trial of intubation and support.  On 11/24/2021: CCM MD updated wife and 2 children.  Palliative care also met with them.  They want to confer with all 4 siblings and mom/patient wife before making a decision.  Poor prognosis expressed.  On 3/12 - on phone -> spoke with the wife over the phone.  She denied any questions.  We discussed his living will.  She read out parts of the living will.  It is a standard legal living will.  The first scenario is that if the condition is incurable then he would not want his life artificially prolonged.  We discussed that patient currently has ARDS and his extremely poor prognosis but at  this point in time we cannot tell her or the family that it is incurable.  However did express to her that despite our best efforts while trying to get him better if he has a cardiac arrest then that would be a condition that is incurable and performing CPR would be against the living will especially given all his medical issues.  She was in agreement.  DNR order written.  But patient is full medical care.     ATTESTATION & SIGNATURE   The patient Justin Trujillo. is critically ill with multiple organ systems failure and requires high complexity decision making for assessment and support, frequent evaluation and titration of therapies, application of advanced monitoring technologies and extensive interpretation of multiple databases.   Critical Care Time devoted to patient care services described in this note is  60  Minutes. This time reflects time of care of this signee Dr Brand Males. This critical care time does not reflect procedure time, or teaching time or supervisory time of PA/NP/Med student/Med Resident etc but could involve care discussion time     Dr. Brand Males, M.D., [facility].C.P Pulmonary and Critical Care Medicine Staff Physician [facility] Pager: [phone], If no answer or between  15:00h -  7:00h: call [phone]  11/26/2021 8:46 AM    LABS    PULMONARY Recent Labs  Lab 11/24/21 1621 11/24/21 1826 11/25/21 0354 11/25/21 1110 11/26/21 0429  PHART 7.46* 7.42 7.53* 7.42 7.32*  PCO2ART 42 45 34 46 54*  PO2ART 68* 130* 151* 77* 120*  HCO3 29.9* 29.2* 28.7* 29.8* 27.8  O2SAT 97.1 100 100 96.6 98.2    CBC Recent Labs  Lab 11/24/21 0302 11/25/21 0354 11/26/21 0141  HGB 11.0* 10.0* 10.1*  HCT 33.4* 29.5* 31.4*  WBC 29.6* 24.8* 20.8*  PLT 202 169 123*    COAGULATION Recent Labs  Lab 11/25/2021 1742  INR 1.3*    CARDIAC  No results for input(s): TROPONINI in the last 168 hours. No results for input(s): PROBNP in the last 168 hours.   CHEMISTRY Recent Labs  Lab 11/23/21 0237 11/23/21 1326 11/23/21 1647 11/24/21 0302 11/24/21 1839 11/25/21 0354 11/26/21 0429  NA 129*  --  128* 128*  --  129* 132*  K 4.2  --  4.2 4.2  --  3.4* 4.0  CL 99  --  96* 97*  --  95* 99  CO2 20*  --  21* 22  --  26 26  GLUCOSE 140*  --  233* 243*  --  110* 198*  BUN 14  --  25* 34*  --  35* 43*  CREATININE 0.87  --  1.04 1.18  --  0.81 0.79  CALCIUM 7.9*  --  7.0* 6.8*  --  6.8* 7.4*  MG 1.9 2.5* 2.4 2.4 2.5*  --  2.4  PHOS  --  5.6* 5.8* 5.2* 3.7  --   --    Estimated Creatinine Clearance: 78.7 mL/min (by C-G formula based on SCr of 0.79 mg/dL).   LIVER Recent Labs  Lab 12/12/2021 0753 12/03/2021 1742 11/24/21 0302 11/26/21 0429  AST 19  --  53* 34  ALT 12  --  22 19  ALKPHOS 137*  --  98 112  BILITOT 0.5  --  0.5 0.5  PROT 5.8*  --  4.7* 4.4*  ALBUMIN 2.9*  --  1.8* 1.8*  INR  --  1.3*  --   --  INFECTIOUS Recent Labs  Lab 11/24/21 1103 11/24/21 1303 11/24/21 1839 11/25/21 0354 11/26/21 0429  LATICACIDVEN 2.8* 2.8*  --   --  2.6*  PROCALCITON  --   --  39.71 34.70 19.94     ENDOCRINE CBG (last 3)  Recent Labs    11/26/21 0023 11/26/21 0421 11/26/21 0748  GLUCAP 198* 208* 197*         IMAGING x48h  - image(s) personally  visualized  -   highlighted in bold DG Chest 1 View  Result Date: 11/25/2021 CLINICAL DATA:  Evaluate endotracheal tube position. EXAM: CHEST  1 VIEW COMPARISON:  Earlier today at 8:55 a.m. FINDINGS: 10:02 a.m. Endotracheal tube is difficult to visualize centrally, felt to terminate 5.2 cm above carina. Nasogastric tube extends beyond the inferior aspect of the film. Left internal jugular line tip poorly visualized centrally. Right sided Port-A-Cath tip at low SVC. Bilateral shoulder arthroplasties. Normal heart size for level of inspiration. Layering bilateral pleural effusions. No pneumothorax. Lower lung predominant interstitial and airspace disease may be slightly progressive. IMPRESSION: Endotracheal tube approximately 5.2 cm above carina. Slight worsening in interstitial and airspace disease, pulmonary edema and/or infection. Probable layering bilateral pleural effusions. Electronically Signed   By: Abigail Miyamoto M.D.   On: 11/25/2021 10:57   DG CHEST PORT 1 VIEW  Result Date: 11/26/2021 CLINICAL DATA:  76 year old male status post intubation. Evaluate endotracheal tube placement. EXAM: PORTABLE CHEST 1 VIEW COMPARISON:  Chest x-ray 11/25/2021. FINDINGS: An endotracheal tube is in place with tip 3.9 cm above the carina. Nasogastric tube extends into the proximal stomach, with side port just distal to the gastroesophageal junction. Right internal jugular single-lumen power porta cath with tip terminating in the mid superior vena cava. Lung volumes are low. Worsening aeration in the lungs bilaterally, with widespread patchy areas of airspace consolidation. Small to moderate bilateral pleural effusions. No pneumothorax. Cardiac silhouette and pulmonary vasculature vasculature is obscured. Status post bilateral shoulder arthroplasty. IMPRESSION: 1. Support apparatus, as above. 2. Severe worsening multilobar bilateral pneumonia, as above. Electronically Signed   By: Vinnie Langton M.D.   On: 11/26/2021  07:41   DG CHEST PORT 1 VIEW  Result Date: 11/25/2021 CLINICAL DATA:  Endotracheal tube placement EXAM: PORTABLE CHEST 1 VIEW COMPARISON:  Yesterday FINDINGS: Left internal jugular line tip at mid to low SVC. Right Port-A-Cath tip at low SVC or superior caval/atrial junction. Endotracheal tube terminates 5.9 cm cm above carina. Nasogastric tube followed into the stomach and beyond the inferior aspect of the film. Midline trachea. Normal heart size for level of inspiration. Probable small layering bilateral pleural effusions. Artifact from heating device projects over the upper chest. No pneumothorax. Interstitial and airspace disease is lower lung predominant, greater right than left, and not significantly changed. IMPRESSION: Endotracheal tube 5.9 cm above carina. No change in interstitial and airspace disease, pulmonary edema and/or infection. Electronically Signed   By: Abigail Miyamoto M.D.   On: 11/25/2021 10:59   DG Chest Port 1 View  Result Date: 11/24/2021 CLINICAL DATA:  ET tube positioning EXAM: PORTABLE CHEST 1 VIEW COMPARISON:  11/24/2021 FINDINGS: Endotracheal tube is 5.5 cm above the carina. Right Port-A-Cath tip is at the cavoatrial junction. Left central line tip is in the SVC. NG tube is in the stomach. Severe bilateral airspace disease, not significantly changed since prior study. Heart is normal size. No visible effusions or pneumothorax. IMPRESSION: Support devices in expected position as above. Severe diffuse bilateral airspace disease, unchanged. Electronically Signed  By: Rolm Baptise M.D.   On: 11/24/2021 19:38

## 2021-11-26 NOTE — Progress Notes (Signed)
eLink Physician-Brief Progress Note ?Patient Name: Justin Trujillo. ?DOB: [birth year] ?MRN: [id number] ? ? ?Date of Service ? 11/26/2021  ?HPI/Events of Note ? Patient has required an increase in his FiO2 tonight while supine, he is still being actively treated pending a family conference tomorrow.  ?eICU Interventions ? Will proceed with scheduled proning at 3:00 AM pending official word of a change in plans.  ? ? ? ?  ? ?Frederik Pear ?11/26/2021, 11:53 PM ?

## 2021-11-26 NOTE — Progress Notes (Signed)
Turned pts head to right side with RN x2 without any complication.  ?

## 2021-11-26 NOTE — Progress Notes (Signed)
Palliative care brief note ? ?I checked in with Mr. Jorgensen wife today when I saw her sitting in the waiting room. ? ?We briefly reviewed clinical course and she reports he has tolerated proning well. ? ?She expressed understanding that he is very sick and that family is working to process what next steps will look like if he does not begin to improve. ? ?She expressed appreciation of the care he is receiving. ? ?Micheline Rough, MD ?[place] [facility] ?[phone] ? ?No charge note ?

## 2021-11-26 NOTE — Plan of Care (Signed)
  Problem: Nutrition: Goal: Adequate nutrition will be maintained Outcome: Progressing   

## 2021-11-26 NOTE — Progress Notes (Signed)
Daily Progress Note   Patient Name: Justin Trujillo.       Date: 11/26/2021 DOB: 11-Jul-1946  Age: 76 y.o. MRN#: [id number] Attending Physician: Brand Males, MD Primary Care Physician: Carol Ada, MD Admit Date: 11/30/2021  Reason for Consultation/Follow-up: Establishing goals of care  Subjective: I saw and examined Mr. Bonny and met with his wife at bedside.  She relays conversation she had with Dr. Chase Caller earlier today and decision to establish DNR in the event of cardiac arrest.  Otherwise, we are continuing with all medical interventions to see how his body continues to respond.  She does tell me, however, the family has been talking about "what if" scenario in the event he does not show improvement.  She tells me that he would find his current quality unacceptable and would not want to be maintained in his current state.  She thinks it would be helpful to have a family meeting tomorrow to further discuss after we see how he does throughout the night tonight.  I also met with 2 of his children, Delsa Bern and Anderson Malta, in the waiting room.  Daughter-in-law, Manuela Schwartz was also present.  We reviewed conversation that I had with Leda Gauze and concerns that he is not showing significant improvement.  Family expressed agreement that it would be helpful to have a meeting tomorrow to discuss reality of his situation and options for his care moving forward.  Length of Stay: 4  Current Medications: Scheduled Meds:   artificial tears  1 application. Both Eyes Q8H   docusate  100 mg Per Tube BID   feeding supplement (PROSource TF)  45 mL Per Tube QID   feeding supplement (VITAL 1.5 CAL)  1,000 mL Per Tube Q24H   fentaNYL (SUBLIMAZE) injection  50 mcg Intravenous Once   furosemide  40 mg Intravenous  Q8H   Gerhardt's butt cream   Topical BID   insulin aspart  0-9 Units Subcutaneous Q4H   insulin aspart  3 Units Subcutaneous Q4H   mouth rinse  15 mL Mouth Rinse 10 times per day   methylPREDNISolone (SOLU-MEDROL) injection  40 mg Intravenous Q24H   multivitamin  15 mL Per Tube Daily   nutrition supplement (JUVEN)  1 packet Per Tube BID BM   pantoprazole (PROTONIX) IV  40 mg Intravenous Daily   polyethylene  glycol  17 g Per Tube Daily   potassium chloride  40 mEq Per Tube BID   sodium chloride flush  10-40 mL Intracatheter Q12H    Continuous Infusions:  sodium chloride Stopped (11/26/21 1809)   sodium chloride 50 mL/hr at 11/26/21 2005   ceFEPime (MAXIPIME) IV Stopped (11/26/21 1450)   cisatracurium (NIMBEX) infusion 2 mg/mL 1.5 mcg/kg/min (11/26/21 2000)   fentaNYL infusion INTRAVENOUS 225 mcg/hr (11/26/21 2107)   midazolam 4 mg/hr (11/26/21 2000)   phenylephrine (NEO-SYNEPHRINE) Adult infusion 20 mcg/min (11/26/21 2053)   vancomycin Stopped (11/26/21 2009)    PRN Meds: sodium chloride, acetaminophen **OR** acetaminophen, albuterol, docusate sodium, fentaNYL, midazolam, polyethylene glycol, sodium chloride flush  Physical Exam         General: Intubated and sedated.  Prone positioning at time of my encounter Heart: Tachycardic Ext: Noted edema Skin: Warm and dry  Vital Signs: BP (!) 83/45    Pulse (!) 106    Temp 97.7 F (36.5 C) (Axillary)    Resp 20    Ht _0  (1.676 m)    Wt 81.3 kg    SpO2 92%    BMI 28.93 kg/m  SpO2: SpO2: 92 % O2 Device: O2 Device: Ventilator O2 Flow Rate: O2 Flow Rate (L/min): 40 L/min  Intake/output summary:  Intake/Output Summary (Last 24 hours) at 11/26/2021 2125 Last data filed at 11/26/2021 2000 Gross per 24 hour  Intake 3620.5 ml  Output 3025 ml  Net 595.5 ml   LBM: Last BM Date : 11/25/21 Baseline Weight: Weight: 71 kg Most recent weight: Weight: 81.3 kg       Palliative Assessment/Data:    Flowsheet Rows    Flowsheet Row  Most Recent Value  Intake Tab   Referral Department Critical care  Unit at Time of Referral ICU  Palliative Care Primary Diagnosis Cancer  Date Notified 11/23/21  Reason for referral Clarify Goals of Care  Date of Admission 11/16/2021  Date first seen by Palliative Care 11/24/21  # of days Palliative referral response time 1 Day(s)  # of days IP prior to Palliative referral 1  Clinical Assessment   Palliative Performance Scale Score 10%  Psychosocial & Spiritual Assessment   Palliative Care Outcomes   Patient/Family meeting held? Yes  Who was at the meeting? Wife, son, daughter  Palliative Care Outcomes Clarified goals of care       Patient Active Problem List   Diagnosis Date Noted   ARDS (adult respiratory distress syndrome) ([place])    Pressure injury of skin 11/23/2021   Malnutrition of moderate degree 11/23/2021   Shock ([place]) 11/26/2021   Hypophosphatemia 10/28/2021   Diarrhea 10/27/2021   Hypokalemia 10/27/2021   Falls 10/26/2021   BPH (benign prostatic hyperplasia) 10/26/2021   Acute respiratory failure with hypoxia secondary to pneumonia 10/26/2021   History of COVID-19 10/26/2021   Falls secondary to weakness 10/26/2021   Pancytopenia due to antineoplastic chemotherapy ([place]) 10/26/2021   Elevated troponin 10/26/2021   Hyponatremia 10/26/2021   Acute respiratory failure with hypoxia ([place]) 10/26/2021   Hypotension 10/26/2021   Hypoalbuminemia 10/26/2021   Cardiomyopathy ([place]) 10/26/2021   Paroxysmal atrial fibrillation ([place]) 08/01/2021   Chemotherapy induced nausea and vomiting 07/16/2021   Vomiting without nausea    AKI (acute kidney injury) ([place]) 07/15/2021   Intractable nausea and vomiting 07/15/2021   Dehydration 59/45/8592   Metabolic acidosis, increased anion gap 07/15/2021   Port-A-Cath in place 06/07/2021   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[place]) 05/10/2021  Malignant neoplasm of lower third of esophagus ([place]) 03/07/2021   S/P reverse total shoulder  arthroplasty, 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   Lumbar stenosis with neurogenic claudication 10/28/2018   DM type 2 (diabetes mellitus, type 2) ([place]) 01/15/2012   CAD (coronary artery disease) 01/15/2012   HTN (hypertension) 01/15/2012   Dyslipidemia 01/15/2012   COPD (chronic obstructive pulmonary disease); chronic bronchitis 01/15/2012   Tobacco abuse 01/15/2012   BMI 40.0-44.9, adult ([place]) 01/15/2012   OSA (obstructive sleep apnea) 01/15/2012   Insomnia 01/15/2012   IBS (irritable bowel syndrome) 01/15/2012   Hypogonadism male 01/15/2012   DJD (degenerative joint disease) 01/15/2012   Rotator cuff arthropathy, left 01/15/2012   GERD (gastroesophageal reflux disease) 01/15/2012   CHEST PAIN, ATYPICAL 07/12/2010   TOBACCO USER 10/20/2009   CAD, NATIVE VESSEL 09/29/2009   EMPHYSEMA 09/21/2009   Controlled type 2 diabetes mellitus with complication, without long-term current use of insulin ([place]) 08/31/2009   HYPERLIPIDEMIA 08/31/2009   OBSTRUCTIVE SLEEP APNEA 08/31/2009   Essential hypertension 08/31/2009   CHRONIC OBSTRUCTIVE PULMONARY DISEASE, ACUTE EXACERBATION 08/31/2009   DYSPNEA 08/31/2009    Palliative Care Assessment & Plan   Patient Profile: 76 y.o. male  with past medical history of metastatic adenocarcinoma of the distal esophagus, hypertension, COPD, OSA admitted on 12/02/2021 after developing abdominal pain and concern for anaphylaxis after receiving FOLFOX and nivolumab at the cancer center.  He has had this regimen multiple times before without problems but was transferred to the ED and had continued decline with respiratory failure, fever, and hypotension.  He was subsequently intubated and consideration this may be more of a sepsis picture with underlying pneumonia.  Palliative consulted for goals of care.  Recommendations/Plan: DNR Continue current care.  Plan for family meeting tomorrow at 3 PM to further discuss his clinical course and goals of care. If decision is made for  one-way extubation, will need to be sure that Nimbex has time to be washed out of his system prior to extubation.  Code Status:    Code Status Orders  (From admission, onward)           Start     Ordered   11/26/21 0845  Do not attempt resuscitation (DNR)  Continuous       Question Answer Comment  In the event of cardiac or respiratory ARREST Do not call a code blue   In the event of cardiac or respiratory ARREST Do not perform Intubation, CPR, defibrillation or ACLS   In the event of cardiac or respiratory ARREST Use medication by any route, position, wound care, and other measures to relive pain and suffering. May use oxygen, suction and manual treatment of airway obstruction as needed for comfort.   Comments full medical care ok      11/26/21 0844           Code Status History     Date Active Date Inactive Code Status Order ID Comments User Context   12/10/2021 1721 11/26/2021 0844 Full Code [id number]  Lestine Mount, PA-C ED   10/26/2021 1743 10/31/2021 0215 Full Code [id number]  Norval Morton, MD ED   10/26/2021 1722 10/26/2021 1743 Partial Code [id number]  Norval Morton, MD ED   07/15/2021 1549 07/21/2021 2105 Partial Code [id number]  Orma Flaming, MD ED   03/26/2019 1341 03/27/2019 1327 Full Code [id number]  Marcellus Scott Inpatient   10/28/2018 1618 11/01/2018 1259 Full Code [id number]  Kristeen Miss, MD Inpatient      Advance Directive Documentation  Flowsheet Row Most Recent Value  Type of Advance Directive Living will  Pre-existing out of facility DNR order (yellow form or pink MOST form) --  "MOST" Form in Place? --       Prognosis: Guarded/poor  Discharge Planning: To Be Determined  Care plan was discussed with wife, son, daughter, daughter-in-law  Thank you for allowing the Palliative Medicine Team to assist in the care of this patient.       Total Time 55 Prolonged Time Billed No   Micheline Rough, MD  Please contact Palliative Medicine  Team phone at [phone] for questions and concerns.

## 2021-11-26 NOTE — Progress Notes (Signed)
Patient was changed from supinated position to proned position at approximately 2330. TOF tested within 15 minutes of proning and found to be 4 at 7. PT tolerated turn appropriately. RASS -5 before and after turning, CPOT 0/10, BIS <60.  ? ?FIO2, pressors, and sedatives titrated downwards after proning due to deeper sedation level and less TOF response, currently 2 twitches at 10.  ?

## 2021-11-26 NOTE — Progress Notes (Addendum)
ANTICOAGULATION CONSULT NOTE  ? ?Pharmacy Consult for heparin ?Indication: atrial fibrillation ? ?Allergies  ?Allergen Reactions  ? Iodinated Contrast Media Anaphylaxis  ? Moxifloxacin Swelling  ?  REACTION: GI upset, throat "tightened up"  ? Penicillins Anaphylaxis  ?  Remote anaphylaxis reaction (not childhood) requiring MD/hospital care ?- tolerates Ceftin, Rocephin, Cefepime ?  ? Shellfish-Derived Products Anaphylaxis  ? Amoxicillin Swelling  ?  Throat tightened up ?Tolerates Ceftin, Rocephin, Cefepime  ? Chlorhexidine Other (See Comments)  ?  Wife is not aware of this allergy  ? Latex Rash  ?  Reaction to prolonged exposure  ? ? ?Patient Measurements: ?Height: '5\' 6"'$  (167.6 cm) ?Weight: 81.3 kg (179 lb 3.7 oz) ?IBW/kg (Calculated) : 63.8 ?Heparin Dosing Weight: 70.8 kg ? ?Vital Signs: ?Temp: 97 ?F (36.1 ?C) (03/12 1242) ?Temp Source: Axillary (03/12 1242) ?BP: 88/54 (03/12 1600) ?Pulse Rate: 107 (03/12 1605) ? ?Labs: ?Recent Labs  ?  11/24/21 ?0302 11/24/21 ?1400 11/24/21 ?2238 11/25/21 ?0354 11/25/21 ?8756 11/25/21 ?1828 11/26/21 ?0141 11/26/21 ?4332 11/26/21 ?9518 11/26/21 ?0800 11/26/21 ?1600  ?HGB 11.0*  --   --  10.0*  --   --  10.1*  --   --   --   --   ?HCT 33.4*  --   --  29.5*  --   --  31.4*  --   --   --   --   ?PLT 202  --   --  169  --   --  123*  --   --   --   --   ?APTT 139*   < > 64*  --  30 30  --   --   --   --   --   ?HEPARINUNFRC 0.81*   < >  --  0.25*  --  <0.10*  --   --   --  0.58 0.51  ?CREATININE 1.18  --   --  0.81  --   --   --  0.79  --   --   --   ?TROPONINIHS  --   --   --   --   --   --   --   --  36*  --   --   ? < > = values in this interval not displayed.  ? ? ? ?Estimated Creatinine Clearance: 78.7 mL/min (by C-G formula based on SCr of 0.79 mg/dL). ? ?Assessment: ?76 year old male presented to the ED from cancer center with possible anaphylactic reaction to chemotherapy infusion. He required non-rebreather and vasopressor support. He is to be admitted to the ICU. Patient with  history of atrial fibrillation on Eliquis PTA. Pharmacy consulted to manage heparin. Given patient acuity on presentation to ED, suspect last dose of Eliquis 3/8 morning prior to appointment at cancer center. ? ?11/26/21 ?Heparin level 0.51 therapeutic with heparin infusing at 1100 units/hr ?CBC: Hgb 10.1, Pltc down to 123 ?HIT/T panel ordered by provider ?No bleeding (skin tears but bleeding not significant) noted ? ?Goal of Therapy:  ?Heparin level 0.3-0.7 units/ml ?aPTT 66-102 seconds ?Monitor platelets by anticoagulation protocol: Yes ?  ?Plan:  ?- Continue heparin infusion rate at 1100 units/hr ?- CBC & heparin level daily while on heparin infusion ?- F/U HIT/T panel ? ?Dimple Nanas, PharmD ?11/26/2021 4:41 PM ? ?PM UPDATE @ 1810: ?- Further drop in platelets to 101 ?- Discontinue heparin drip per MD and associated labs ?- F/u lower extremity dopplers and HIT panel ? ?Dimple Nanas, PharmD ?  11/26/2021 6:11 PM ? ?

## 2021-11-26 NOTE — Progress Notes (Addendum)
Removed commercial tube holder. No breakdown noted, Placed mapelax to protect skin. Secured the ETT tube with tape left side @ 26. Pt Proned with RT x 2 and RN x 5 without any complications. Pts head turned to left side.  ?

## 2021-11-26 NOTE — Progress Notes (Signed)
Unproned at [place] ?

## 2021-11-26 NOTE — Progress Notes (Signed)
ANTICOAGULATION CONSULT NOTE  ? ?Pharmacy Consult for heparin ?Indication: atrial fibrillation ? ?Allergies  ?Allergen Reactions  ? Iodinated Contrast Media Anaphylaxis  ? Moxifloxacin Swelling  ?  REACTION: GI upset, throat "tightened up"  ? Penicillins Anaphylaxis  ?  Remote anaphylaxis reaction (not childhood) requiring MD/hospital care ?- tolerates Ceftin, Rocephin, Cefepime ?  ? Shellfish-Derived Products Anaphylaxis  ? Amoxicillin Swelling  ?  Throat tightened up ?Tolerates Ceftin, Rocephin, Cefepime  ? Chlorhexidine Other (See Comments)  ?  Wife is not aware of this allergy  ? Latex Rash  ?  Reaction to prolonged exposure  ? ? ?Patient Measurements: ?Height: '5\' 6"'$  (167.6 cm) ?Weight: 81.3 kg (179 lb 3.7 oz) ?IBW/kg (Calculated) : 63.8 ?Heparin Dosing Weight: 70.8 kg ? ?Vital Signs: ?Temp: 97.6 ?F (36.4 ?C) (03/12 0800) ?Temp Source: Axillary (03/12 0800) ?BP: 97/52 (03/12 0412) ?Pulse Rate: 91 (03/12 0700) ? ?Labs: ?Recent Labs  ?  11/24/21 ?0302 11/24/21 ?1400 11/24/21 ?2238 11/25/21 ?0354 11/25/21 ?2992 11/25/21 ?1828 11/26/21 ?0141 11/26/21 ?4268 11/26/21 ?3419 11/26/21 ?0800  ?HGB 11.0*  --   --  10.0*  --   --  10.1*  --   --   --   ?HCT 33.4*  --   --  29.5*  --   --  31.4*  --   --   --   ?PLT 202  --   --  169  --   --  123*  --   --   --   ?APTT 139*   < > 64*  --  30 30  --   --   --   --   ?HEPARINUNFRC 0.81*   < >  --  0.25*  --  <0.10*  --   --   --  0.58  ?CREATININE 1.18  --   --  0.81  --   --   --  0.79  --   --   ?TROPONINIHS  --   --   --   --   --   --   --   --  36*  --   ? < > = values in this interval not displayed.  ? ? ? ?Estimated Creatinine Clearance: 78.7 mL/min (by C-G formula based on SCr of 0.79 mg/dL). ? ?Assessment: ?76 year old male presented to the ED from cancer center with possible anaphylactic reaction to chemotherapy infusion. He required non-rebreather and vasopressor support. He is to be admitted to the ICU. Patient with history of atrial fibrillation on Eliquis PTA.  Pharmacy consulted to manage heparin. Given patient acuity on presentation to ED, suspect last dose of Eliquis 3/8 morning prior to appointment at cancer center. ? ?11/26/21 ?0800 Heparin level 0.58 therapeutic with heparin infusing at 1100 units/hr ?CBC: Hgb 10.1, Pltc down to 123 ?HIT/T panel ordered by provider ?No bleeding (skin tears but bleeding not significant) noted ? ?Goal of Therapy:  ?Heparin level 0.3-0.7 units/ml ?aPTT 66-102 seconds ?Monitor platelets by anticoagulation protocol: Yes ?  ?Plan:  ?- Continue heparin infusion rate at 1100 units/hr ?- Confirmatory HL in 8 hours ?- CBC & heparin level daily while on heparin infusion ?- F/U HIT/T panel ? ?Royetta Asal, PharmD, BCPS ?Clinical Pharmacist ?[facility] ?Please utilize Amion for appropriate phone number to reach the unit pharmacist ([place]) ?11/26/2021 11:24 AM ? ? ? ? ?

## 2021-11-26 NOTE — Progress Notes (Signed)
? ?  Recent Labs  ?Lab 11/23/21 ?0237 11/24/21 ?0302 11/25/21 ?0354 11/26/21 ?0141 11/26/21 ?1613  ?PLT 237 202 169 123* 101*  ? ? ?Cotninued dro p with platelets ? ?Plan ? - stop IV heparin ? -a wait duplex LE and HITT panel ? - migh need DTI ? ? ? ?SIGNATURE  ? ? ?Dr. Brand Males, M.D., F.C.C.P,  ?Pulmonary and Critical Care Medicine ?Staff Physician, [place] ?Center Director - Interstitial Lung Disease  Program  ?Pulmonary [facility] ?[place], [name], [address] ? ?NPI Number:  NPI #[id number] ?DEA Number: [id number] ? ?Pager: [phone], If no answer  -> Check AMION or Try [phone] ?Telephone (clinical office): [phone] ?Telephone (research): [phone] ? ?5:58 PM ?11/26/2021 ? ?

## 2021-11-26 NOTE — Progress Notes (Signed)
eLink Physician-Brief Progress Note ?Patient Name: Justin Trujillo. ?DOB: [birth year] ?MRN: [id number] ? ? ?Date of Service ? 11/26/2021  ?HPI/Events of Note ? Blood  pressure is soft following supination, he has atrial fibrillation with a rate of 107, he has required pressors in the past after supination per bedside RN.  ?eICU Interventions ? Phenylephrine gtt ordered,  ? ? ? ?  ? ?Frederik Pear ?11/26/2021, 8:25 PM ?

## 2021-11-26 NOTE — Progress Notes (Signed)
Notified pharmacy that patients heparin was paused from approximately 2315-0033 due to proning. Pharmacist Dorian Pod changed Heparin repeat lab draw to 0800. No outward signs of bleeding. Will continue to assess.  ?

## 2021-11-27 ENCOUNTER — Encounter (HOSPITAL_COMMUNITY): Payer: PPO

## 2021-11-27 ENCOUNTER — Inpatient Hospital Stay (HOSPITAL_COMMUNITY): Payer: PPO

## 2021-11-27 DIAGNOSIS — J80 Acute respiratory distress syndrome: Secondary | ICD-10-CM | POA: Diagnosis not present

## 2021-11-27 DIAGNOSIS — J9601 Acute respiratory failure with hypoxia: Secondary | ICD-10-CM | POA: Diagnosis not present

## 2021-11-27 DIAGNOSIS — Z7189 Other specified counseling: Secondary | ICD-10-CM | POA: Diagnosis not present

## 2021-11-27 LAB — BLOOD GAS, ARTERIAL
Acid-Base Excess: 1.3 mmol/L (ref 0.0–2.0)
Bicarbonate: 29.6 mmol/L — ABNORMAL HIGH (ref 20.0–28.0)
FIO2: 60.1 %
MECHVT: 380 mL
O2 Saturation: 99.1 %
PEEP: 12 cmH2O
Patient temperature: 36.8
RATE: 20 resp/min
pCO2 arterial: 65 mmHg — ABNORMAL HIGH (ref 32–48)
pH, Arterial: 7.26 — ABNORMAL LOW (ref 7.35–7.45)
pO2, Arterial: 104 mmHg (ref 83–108)

## 2021-11-27 LAB — COMPREHENSIVE METABOLIC PANEL
ALT: 21 U/L (ref 0–44)
AST: 27 U/L (ref 15–41)
Albumin: 1.8 g/dL — ABNORMAL LOW (ref 3.5–5.0)
Alkaline Phosphatase: 133 U/L — ABNORMAL HIGH (ref 38–126)
Anion gap: 6 (ref 5–15)
BUN: 63 mg/dL — ABNORMAL HIGH (ref 8–23)
CO2: 29 mmol/L (ref 22–32)
Calcium: 7.7 mg/dL — ABNORMAL LOW (ref 8.9–10.3)
Chloride: 101 mmol/L (ref 98–111)
Creatinine, Ser: 1.08 mg/dL (ref 0.61–1.24)
GFR, Estimated: >60 mL/min (ref >60)
Glucose, Bld: 164 mg/dL — ABNORMAL HIGH (ref 70–99)
Potassium: 4.7 mmol/L (ref 3.5–5.1)
Sodium: 136 mmol/L (ref 135–145)
Total Bilirubin: 0.3 mg/dL (ref 0.3–1.2)
Total Protein: 4.8 g/dL — ABNORMAL LOW (ref 6.5–8.1)

## 2021-11-27 LAB — GLUCOSE, CAPILLARY
Glucose-Capillary: 109 mg/dL — ABNORMAL HIGH (ref 70–99)
Glucose-Capillary: 120 mg/dL — ABNORMAL HIGH (ref 70–99)
Glucose-Capillary: 156 mg/dL — ABNORMAL HIGH (ref 70–99)
Glucose-Capillary: 171 mg/dL — ABNORMAL HIGH (ref 70–99)
Glucose-Capillary: 174 mg/dL — ABNORMAL HIGH (ref 70–99)
Glucose-Capillary: 185 mg/dL — ABNORMAL HIGH (ref 70–99)
Glucose-Capillary: 208 mg/dL — ABNORMAL HIGH (ref 70–99)
Glucose-Capillary: 61 mg/dL — ABNORMAL LOW (ref 70–99)

## 2021-11-27 LAB — MAGNESIUM: Magnesium: 2.4 mg/dL (ref 1.7–2.4)

## 2021-11-27 LAB — CBC
HCT: 34.3 % — ABNORMAL LOW (ref 39.0–52.0)
Hemoglobin: 10.5 g/dL — ABNORMAL LOW (ref 13.0–17.0)
MCH: 31.8 pg (ref 26.0–34.0)
MCHC: 30.6 g/dL (ref 30.0–36.0)
MCV: 103.9 fL — ABNORMAL HIGH (ref 80.0–100.0)
Platelets: 111 10*3/uL — ABNORMAL LOW (ref 150–400)
RBC: 3.3 MIL/uL — ABNORMAL LOW (ref 4.22–5.81)
RDW: 15.9 % — ABNORMAL HIGH (ref 11.5–15.5)
WBC: 25.1 10*3/uL — ABNORMAL HIGH (ref 4.0–10.5)
nRBC: 0 % (ref 0.0–0.2)

## 2021-11-27 LAB — CULTURE, BLOOD (ROUTINE X 2)
Culture: NO GROWTH
Culture: NO GROWTH
Special Requests: ADEQUATE

## 2021-11-27 LAB — PHOSPHORUS: Phosphorus: 2.2 mg/dL — ABNORMAL LOW (ref 2.5–4.6)

## 2021-11-27 LAB — CK TOTAL AND CKMB (NOT AT ARMC)
CK, MB: 5.2 ng/mL — ABNORMAL HIGH (ref 0.5–5.0)
Relative Index: INVALID (ref 0.0–2.5)
Total CK: 45 U/L — ABNORMAL LOW (ref 49–397)

## 2021-11-27 LAB — LACTIC ACID, PLASMA: Lactic Acid, Venous: 1.4 mmol/L (ref 0.5–1.9)

## 2021-11-27 LAB — LEGIONELLA PNEUMOPHILA SEROGP 1 UR AG: L. pneumophila Serogp 1 Ur Ag: NEGATIVE

## 2021-11-27 LAB — HEPARIN INDUCED PLATELET AB (HIT ANTIBODY): Heparin Induced Plt Ab: 0.099 OD (ref 0.000–0.400)

## 2021-11-27 MED ORDER — LIP MEDEX EX OINT
TOPICAL_OINTMENT | CUTANEOUS | Status: DC | PRN
  Administered 2021-11-27 – 2021-11-28 (×2): 1 via TOPICAL
  Filled 2021-11-27: qty 7

## 2021-11-27 MED ORDER — DEXTROSE 5 % IV SOLN
20.0000 mmol | Freq: Once | INTRAVENOUS | Status: AC
  Administered 2021-11-27: 20 mmol via INTRAVENOUS
  Filled 2021-11-27: qty 6.67

## 2021-11-27 MED ORDER — ENOXAPARIN SODIUM 40 MG/0.4ML IJ SOSY
40.0000 mg | PREFILLED_SYRINGE | INTRAMUSCULAR | Status: DC
Start: 2021-11-27 — End: 2021-11-29
  Administered 2021-11-27 – 2021-11-28 (×2): 40 mg via SUBCUTANEOUS
  Filled 2021-11-27 (×2): qty 0.4

## 2021-11-27 MED ORDER — DEXTROSE 50 % IV SOLN
12.5000 g | INTRAVENOUS | Status: AC
  Administered 2021-11-27: 12.5 g via INTRAVENOUS
  Filled 2021-11-27: qty 50

## 2021-11-27 MED ORDER — ACETAMINOPHEN 325 MG PO TABS: 650.0000 mg | ORAL_TABLET | Freq: Four times a day (QID) | ORAL | Status: DC | PRN

## 2021-11-27 MED ORDER — PANTOPRAZOLE 2 MG/ML SUSPENSION
40.0000 mg | Freq: Two times a day (BID) | ORAL | Status: DC
  Administered 2021-11-27 – 2021-11-28 (×4): 40 mg
  Filled 2021-11-27 (×4): qty 20

## 2021-11-27 MED ORDER — ACETAMINOPHEN 650 MG RE SUPP: 650.0000 mg | Freq: Four times a day (QID) | RECTAL | Status: DC | PRN

## 2021-11-27 NOTE — Progress Notes (Signed)
Family is still processing how suddenly RJ's condition changed.  They are hoping that they will be able to learn some more information about what happened and about what happens if the medical team has exhausted all possible interventions.  Chaplain provided reflective listening as they shared.  ? ?[employer], Bcc ?Pager, [phone] ?2:24 PM ? ?

## 2021-11-27 NOTE — Progress Notes (Addendum)
HEMATOLOGY-ONCOLOGY PROGRESS NOTE  ASSESSMENT AND PLAN: Justin Trujillo 76 y.o. male with medical history significant for metastatic esophageal adenocarcinoma.  Now admitted to the hospital following a possible infusion reaction in our office.  Admitted for shock and acute respiratory failure with ARDS.  Remains intubated and sedated.  On pressors and paralytics.   #Septic Shock from pneumonia -Presumed to be anaphylactic, but had fever up to 102.4 on admission so also could be sepsis. -5-FU was infusing at the time of his reaction and coronary vasospasm also possibility.  However, troponin is only mildly elevated. -Cultures negative to date. -On empiric antibiotics -Received empiric anaphylaxis treatment with Solu-Medrol, Benadryl and Pepcid. -On pressors. -CT chest/abdomen/pelvis with concern for consolidation in both lobes and pulmonary edema.   #Acute respiratory failure with ARDS, bilateral consolidation the lower lobes, pulmonary edema, OSA, COPD -Intubated 11/23/2021 due to respiratory failure with ARDS -Status post bronchoscopy. -He could have immune mediated pneumonitis and would recommend consideration of moderate dose steroids. -Appreciate assistance from PCCM and palliative care. -Overall prognosis is thought to be poor and palliative care plan to meet with the patient's family later today. -He was made DNR over the weekend.   #Metastatic Adenocarcinoma of the Esophagus #Dysphagia -- Underwent palliative radiation in order to help alleviate his dysphagia and was able to tolerate p.o following radiation. --Unfortunately given the spread of this cancer to the skin we will need to treat this as metastatic disease with systemic therapy  -- Final testing on the tissue resulted with HER2 negative status, but TPS score of 60%.  Therefore he would be a good candidate for FOLFOX with nivolumab --Started Cycle 1 Day 1 of FOLFOX plus Nivolumab on 04/26/2021 with good tolerance.  --Restaging  CT Scan form 10/20/2021 shows stable soft tissue thickening of the esophagus. No evidence of progression.  -Developed a reaction during his infusion for cycle #12 in our office on 12/14/2021.  Unclear if current condition related to anaphylactic reaction from chemotherapy versus something else driving this.  I think anaphylactic reaction is less likely at this time as I would have expected him to improve at this point with interventions. -Palliative care following.  ##Mild anemia and thrombocytopenia --Likely due to recent chemotherapy --Monitor  [name], DNP, AGPCNP-BC, AOCNP  SUBJECTIVE: No family at bedside.  Discussed with nursing.  Currently prone.  On pressors, sedation, paralytics.   Oncology History  Malignant neoplasm of lower third of esophagus ([place])  03/07/2021 Initial Diagnosis   Malignant neoplasm of lower third of esophagus ([place])   03/30/2021 Cancer Staging   Staging form: Esophagus - Adenocarcinoma, AJCC 8th Edition - Clinical stage from 03/30/2021: Stage IVB (cT2, cN0, pM1) - Signed by Orson Slick, MD on 03/30/2021 Stage prefix: Initial diagnosis Total positive nodes: 0   04/26/2021 -  Chemotherapy   Patient is on Treatment Plan : GASTROESOPHAGEAL FOLFOX + Nivolumab q14d        REVIEW OF SYSTEMS:   Review of Systems  Reason unable to perform ROS: Unable to obtain.   I have reviewed the past medical history, past surgical history, social history and family history with the patient and they are unchanged from previous note.   PHYSICAL EXAMINATION: ECOG PERFORMANCE STATUS: 4 - Bedbound  Vitals:   11/27/21 0750 11/27/21 0800  BP:    Pulse:  100  Resp:  18  Temp:  97.6 F (36.4 C)  SpO2: 94% 94%   Filed Weights   11/25/21 0409 11/26/21 0500 11/27/21 0500  Weight: 78.5 kg 81.3 kg 80.1 kg    Intake/Output from previous day: 03/12 0701 - 03/13 0700 In: 2396.5 [I.V.:1810.9; IV Piggyback:585.6] Out: 2300 [Urine:2300]  Physical Exam Vitals  reviewed.  Constitutional:      Appearance: He is ill-appearing.     Comments: Patient is in the prone position.  HENT:     Head: Normocephalic.  Pulmonary:     Breath sounds: Rhonchi present.     Comments: Diminished Skin:    General: Skin is warm and dry.  Neurological:     Comments: Intubated, sedated    LABORATORY DATA:  I have reviewed the data as listed CMP Latest Ref Rng & Units 11/27/2021 11/26/2021 11/25/2021  Glucose 70 - 99 mg/dL 164(H) 198(H) 110(H)  BUN 8 - 23 mg/dL 63(H) 43(H) 35(H)  Creatinine 0.61 - 1.24 mg/dL 1.08 0.79 0.81  Sodium 135 - 145 mmol/L 136 132(L) 129(L)  Potassium 3.5 - 5.1 mmol/L 4.7 4.0 3.4(L)  Chloride 98 - 111 mmol/L 101 99 95(L)  CO2 22 - 32 mmol/L _0 Calcium 8.9 - 10.3 mg/dL 7.7(L) 7.4(L) 6.8(L)  Total Protein 6.5 - 8.1 g/dL 4.8(L) 4.4(L) -  Total Bilirubin 0.3 - 1.2 mg/dL 0.3 0.5 -  Alkaline Phos 38 - 126 U/L 133(H) 112 -  AST 15 - 41 U/L 27 34 -  ALT 0 - 44 U/L 21 19 -    Lab Results  Component Value Date   WBC 25.1 (H) 11/27/2021   HGB 10.5 (L) 11/27/2021   HCT 34.3 (L) 11/27/2021   MCV 103.9 (H) 11/27/2021   PLT 111 (L) 11/27/2021   NEUTROABS 19.0 (H) 11/26/2021    Lab Results  Component Value Date   CEA1 2.07 02/06/2021   ZOX096 46 (H) 02/06/2021   PSA1 0.8 02/06/2021    CT ABDOMEN PELVIS WO CONTRAST  Result Date: 11/21/2021 CLINICAL DATA:  Hypercoagulable state.  Concern for PE. EXAM: CT CHEST, ABDOMEN AND PELVIS WITHOUT CONTRAST TECHNIQUE: Multidetector CT imaging of the chest, abdomen and pelvis was performed following the standard protocol without IV contrast. RADIATION DOSE REDUCTION: This exam was performed according to the departmental dose-optimization program which includes automated exposure control, adjustment of the mA and/or kV according to patient size and/or use of iterative reconstruction technique. COMPARISON:  Chest, abdomen and pelvis CT no contrast 10/26/2021, and chest, abdomen and pelvis CT with  contrast 07/13/2021. FINDINGS: CT chest without contrast findings Cardiovascular: There is mild cardiomegaly, small pericardial effusion, scattered calcifications in the aortic valve annulus and heavy calcifications in the left lateral and inferior mitral annulus. 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 Right IJ port catheter tip remains in the SVC. There is moderate atherosclerosis in the thoracic aorta and great vessels and 4.1 cm aneurysmal ectasia, unchanged in the ascending segment, the remainder normal in caliber. Lipomatous hypertrophy of the inter-atrial septum appears similar as well as upper limit of normal caliber the pulmonary arteries. Assessment for arterial emboli can't be made without contrast. The superior pulmonary veins are more distended than previously. Mediastinum/Nodes: Patulous esophagus containing scattered fluid, mild distal esophageal wall thickening is unchanged. The lower poles of the thyroid gland and axillary spaces are unremarkable, with the thyroid partially obscured by streak artifact from bilateral shoulder replacements. No intrathoracic adenopathy is appreciated without contrast, but noncontrast assessment is somewhat limited. There are retained versus aspirated secretions in the trachea and in the posterior right main bronchus of which were noted on  the last CT. Lungs/Pleura: There are increased small symmetric layering pleural effusions. Increased subpleural septal thickening in the lung bases and apices is seen consistent with interstitial edema with slight central interstitial edema also believed present. There is increased posterior consolidation or atelectasis in both lower lobes. There is increased perihilar airspace disease in both upper lobes and increased scattered ground-glass opacities in the peripheral apices, both findings greater on the right. Increased peripheral subpleural consolidation is similar to last  month's study but certainly increased from 07/13/2021, with background subpleural reticulation in the mid to lower lung fields noted at that time without honeycombing. There is no pneumothorax. Central bronchial thickening continues to be noted in all lobes. Musculoskeletal: Osteopenia and degenerative change thoracic spine. No displaced rib or sternal fracture. Subacute mild-to-moderate compression fractures T11 vertebral bodies are again noted. Fractures were probably recent to early subacute on the 10/26/2021 exam and there is increased subcortical vacuum phenomenon upper plate of C14 and lower plate of G81 and in the disc space compared to the prior study, at T12 with few mm retropulsion again shown. There is only mild spinal canal stenosis associated with this. No pedicle or posterior element fracture. There is edema in the chest wall which is likely congestive or from fluid overload. Artifact from bilateral shoulder replacements is again noted. CT ABDOMEN PELVIS FINDINGS Factors affecting image quality: Respiratory motion limits fine detail in the abdomen as well as artifact from the patient's arms. Hepatobiliary: No focal liver abnormality is seen through the motion and streak artifact. The gallbladder is distended and otherwise unremarkable. There is no biliary dilatation. Pancreas: No focal abnormality is seen through artifacts. No ductal dilatation or adjacent edema. Spleen: No focal noncontrast CT abnormality or splenomegaly.Stranding along the spleen noted on the last CT is not seen today. Adrenals/Urinary Tract: There is no adrenal mass. Bilateral perinephric stranding appears similar with minimal perinephric fluid. No evidence of urinary stone or obstruction, no focal abnormality of the renal cortex allowing for spray artifact from adjacent metallic spinal hardware. There is mild bladder thickening versus underdistention. No masslike thickening is seen. Stomach/Bowel: No dilated or inflamed bowel  segments are identified. There is a normal caliber appendix. There is mild-to-moderate fecal stasis in the rectosigmoid colon. Left colonic diverticula without inflammation. Small hiatal hernia. Vascular/Lymphatic: Heavy aortoiliac atherosclerosis is seen. No AAA is seen. No appreciable adenopathy through the artifacts. Reproductive: Prominent prostate 5.1 cm transverse with central calcifications. Other: There is minimal abdominal and pelvic free ascites. There is body wall anasarca increased over prior study. Small umbilical and inguinal fat hernias. 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 IMPRESSION: 1. Findings consistent with CHF or fluid overload, with cardiomegaly, prominent superior pulmonary veins, at least mild features of interstitial edema in the lungs, small pleural effusions and body wall anasarca. Minimal ascites in the abdomen and pelvis. 2. 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 Annual CTA or MRA follow-up recommended and vascular surgery referral unless already done. 3. Increased airspace disease in the lung fields, probably a combination of pneumonia and edema. Background subpleural reticulation in the mid and lower lungs. Follow-up study recommended after treatment. 4. Bilateral bronchial thickening is similar to the last CT as well as aspirated or retained secretions in the trachea and right main bronchus. 5. Mild-to-moderate wedge compression fractures of T11 and 12, similar in degree but with  increased vacuum phenomenon in the vertebral bodies and intervening disc space since the last CT. 6. Small hiatal hernia with similar distal esophageal thickening. 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 Clinical correlation advised. 7. Mild bladder thickening versus nondistention.  Prostatomegaly. 8.  Rectosigmoid constipation without bowel obstruction or gross inflammatory reaction. 9. Distended but otherwise unremarkable gallbladder. 10. Aortic and coronary artery atherosclerosis Electronically Signed   By: Telford Nab M.D.   On: 11/26/2021 23:24   DG Chest 1 View  Result Date: 11/25/2021 CLINICAL DATA:  Evaluate endotracheal tube position. EXAM: CHEST  1 VIEW COMPARISON:  Earlier today at 8:55 a.m. FINDINGS: 10:02 a.m. Endotracheal tube is difficult to visualize centrally, felt to terminate 5.2 cm above carina. Nasogastric tube extends beyond the inferior aspect of the film. Left internal jugular line tip poorly visualized centrally. Right sided Port-A-Cath tip at low SVC. Bilateral shoulder arthroplasties. Normal heart size for level of inspiration. Layering bilateral pleural effusions. No pneumothorax. Lower lung predominant interstitial and airspace disease may be slightly progressive. IMPRESSION: Endotracheal tube approximately 5.2 cm above carina. Slight worsening in interstitial and airspace disease, pulmonary edema and/or infection. Probable layering bilateral pleural effusions. Electronically Signed   By: Abigail Miyamoto M.D.   On: 11/25/2021 10:57   CT CHEST WO CONTRAST  Result Date: 12/09/2021 CLINICAL DATA:  Hypercoagulable state.  Concern for PE. EXAM: CT CHEST, ABDOMEN AND PELVIS WITHOUT CONTRAST TECHNIQUE: Multidetector CT imaging of the chest, abdomen and pelvis was performed following the standard protocol without IV contrast. RADIATION DOSE REDUCTION: This exam was performed according to the departmental dose-optimization program which includes automated exposure control, adjustment of the mA and/or kV according to patient size and/or use of iterative reconstruction technique. COMPARISON:  Chest, abdomen and pelvis CT no contrast 10/26/2021, and chest, abdomen and pelvis CT with contrast 07/13/2021. FINDINGS: CT chest without contrast findings Cardiovascular: There is mild cardiomegaly,  small pericardial effusion, scattered calcifications in the aortic valve annulus and heavy calcifications in the left lateral and inferior mitral annulus. 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 Right IJ port catheter tip remains in the SVC. There is moderate atherosclerosis in the thoracic aorta and great vessels and 4.1 cm aneurysmal ectasia, unchanged in the ascending segment, the remainder normal in caliber. Lipomatous hypertrophy of the inter-atrial septum appears similar as well as upper limit of normal caliber the pulmonary arteries. Assessment for arterial emboli can't be made without contrast. The superior pulmonary veins are more distended than previously. Mediastinum/Nodes: Patulous esophagus containing scattered fluid, mild distal esophageal wall thickening is unchanged. The lower poles of the thyroid gland and axillary spaces are unremarkable, with the thyroid partially obscured by streak artifact from bilateral shoulder replacements. No intrathoracic adenopathy is appreciated without contrast, but noncontrast assessment is somewhat limited. There are retained versus aspirated secretions in the trachea and in the posterior right main bronchus of which were noted on the last CT. Lungs/Pleura: There are increased small symmetric layering pleural effusions. Increased subpleural septal thickening in the lung bases and apices is seen consistent with interstitial edema with slight central interstitial edema also believed present. There is increased posterior consolidation or atelectasis in both lower lobes. There is increased perihilar airspace disease in both upper lobes and increased scattered ground-glass opacities in the peripheral apices, both findings greater on the right. Increased peripheral subpleural consolidation is similar to last month's study but certainly increased from 07/13/2021, with background subpleural reticulation in the mid to lower  lung fields noted at that time without honeycombing. There is no pneumothorax. Central bronchial thickening continues to be noted in all lobes. Musculoskeletal: Osteopenia and degenerative change thoracic spine. No displaced rib or sternal fracture. Subacute mild-to-moderate compression fractures T11 vertebral bodies are again noted. Fractures were probably recent to early subacute on the 10/26/2021 exam and there is increased subcortical vacuum phenomenon upper plate of I77 and lower plate of O24 and in the disc space compared to the prior study, at T12 with few mm retropulsion again shown. There is only mild spinal canal stenosis associated with this. No pedicle or posterior element fracture. There is edema in the chest wall which is likely congestive or from fluid overload. Artifact from bilateral shoulder replacements is again noted. CT ABDOMEN PELVIS FINDINGS Factors affecting image quality: Respiratory motion limits fine detail in the abdomen as well as artifact from the patient's arms. Hepatobiliary: No focal liver abnormality is seen through the motion and streak artifact. The gallbladder is distended and otherwise unremarkable. There is no biliary dilatation. Pancreas: No focal abnormality is seen through artifacts. No ductal dilatation or adjacent edema. Spleen: No focal noncontrast CT abnormality or splenomegaly.Stranding along the spleen noted on the last CT is not seen today. Adrenals/Urinary Tract: There is no adrenal mass. Bilateral perinephric stranding appears similar with minimal perinephric fluid. No evidence of urinary stone or obstruction, no focal abnormality of the renal cortex allowing for spray artifact from adjacent metallic spinal hardware. There is mild bladder thickening versus underdistention. No masslike thickening is seen. Stomach/Bowel: No dilated or inflamed bowel segments are identified. There is a normal caliber appendix. There is mild-to-moderate fecal stasis in the rectosigmoid  colon. Left colonic diverticula without inflammation. Small hiatal hernia. Vascular/Lymphatic: Heavy aortoiliac atherosclerosis is seen. No AAA is seen. No appreciable adenopathy through the artifacts. Reproductive: Prominent prostate 5.1 cm transverse with central calcifications. Other: There is minimal abdominal and pelvic free ascites. There is body wall anasarca increased over prior study. Small umbilical and inguinal fat hernias. 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 IMPRESSION: 1. Findings consistent with CHF or fluid overload, with cardiomegaly, prominent superior pulmonary veins, at least mild features of interstitial edema in the lungs, small pleural effusions and body wall anasarca. Minimal ascites in the abdomen and pelvis. 2. 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 Annual CTA or MRA follow-up recommended and vascular surgery referral unless already done. 3. Increased airspace disease in the lung fields, probably a combination of pneumonia and edema. Background subpleural reticulation in the mid and lower lungs. Follow-up study recommended after treatment. 4. Bilateral bronchial thickening is similar to the last CT as well as aspirated or retained secretions in the trachea and right main bronchus. 5. Mild-to-moderate wedge compression fractures of T11 and 12, similar in degree but with increased vacuum phenomenon in the vertebral bodies and intervening disc space since the last CT. 6. Small hiatal hernia with similar distal esophageal thickening. 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 Clinical correlation advised. 7. Mild bladder thickening versus nondistention.  Prostatomegaly. 8. Rectosigmoid constipation without bowel obstruction or gross inflammatory reaction. 9. Distended but otherwise unremarkable  gallbladder. 10. Aortic and coronary artery atherosclerosis Electronically Signed   By: Telford Nab M.D.   On: 12/03/2021 23:24   DG CHEST PORT 1 VIEW  Result Date: 11/27/2021 CLINICAL DATA:  Follow-up pneumonia EXAM:  PORTABLE CHEST 1 VIEW COMPARISON:  11/26/2021 FINDINGS: Endotracheal tube, enteric tube, and bilateral central venous catheters are unchanged in position. Diffuse bilateral airspace infiltrates with bilateral pleural effusions also appear unchanged since prior study. No pneumothorax. Postoperative changes in the shoulders. IMPRESSION: Appliances are unchanged in position. Persistent bilateral pulmonary infiltrates and effusions. Electronically Signed   By: Lucienne Capers M.D.   On: 11/27/2021 02:07   DG CHEST PORT 1 VIEW  Result Date: 11/26/2021 CLINICAL DATA:  76 year old male status post intubation. Evaluate endotracheal tube placement. EXAM: PORTABLE CHEST 1 VIEW COMPARISON:  Chest x-ray 11/25/2021. FINDINGS: An endotracheal tube is in place with tip 3.9 cm above the carina. Nasogastric tube extends into the proximal stomach, with side port just distal to the gastroesophageal junction. Right internal jugular single-lumen power porta cath with tip terminating in the mid superior vena cava. Lung volumes are low. Worsening aeration in the lungs bilaterally, with widespread patchy areas of airspace consolidation. Small to moderate bilateral pleural effusions. No pneumothorax. Cardiac silhouette and pulmonary vasculature vasculature is obscured. Status post bilateral shoulder arthroplasty. IMPRESSION: 1. Support apparatus, as above. 2. Severe worsening multilobar bilateral pneumonia, as above. Electronically Signed   By: Vinnie Langton M.D.   On: 11/26/2021 07:41   DG CHEST PORT 1 VIEW  Result Date: 11/25/2021 CLINICAL DATA:  Endotracheal tube placement EXAM: PORTABLE CHEST 1 VIEW COMPARISON:  Yesterday FINDINGS: Left internal jugular line tip at mid to low SVC. Right Port-A-Cath tip  at low SVC or superior caval/atrial junction. Endotracheal tube terminates 5.9 cm cm above carina. Nasogastric tube followed into the stomach and beyond the inferior aspect of the film. Midline trachea. Normal heart size for level of inspiration. Probable small layering bilateral pleural effusions. Artifact from heating device projects over the upper chest. No pneumothorax. Interstitial and airspace disease is lower lung predominant, greater right than left, and not significantly changed. IMPRESSION: Endotracheal tube 5.9 cm above carina. No change in interstitial and airspace disease, pulmonary edema and/or infection. Electronically Signed   By: Abigail Miyamoto M.D.   On: 11/25/2021 10:59   DG Chest Port 1 View  Result Date: 11/24/2021 CLINICAL DATA:  ET tube positioning EXAM: PORTABLE CHEST 1 VIEW COMPARISON:  11/24/2021 FINDINGS: Endotracheal tube is 5.5 cm above the carina. Right Port-A-Cath tip is at the cavoatrial junction. Left central line tip is in the SVC. NG tube is in the stomach. Severe bilateral airspace disease, not significantly changed since prior study. Heart is normal size. No visible effusions or pneumothorax. IMPRESSION: Support devices in expected position as above. Severe diffuse bilateral airspace disease, unchanged. Electronically Signed   By: Rolm Baptise M.D.   On: 11/24/2021 19:38   Portable Chest xray  Result Date: 11/24/2021 CLINICAL DATA:  Ventilator dependent respiratory failure. EXAM: PORTABLE CHEST 1 VIEW COMPARISON:  Portable chest yesterday at 6:02 p.m. FINDINGS: NGT tip is probably in the body of stomach based on the position of the proximal side hole with the tip not included in the exam. ETT tip is 4 cm from the carina with left IJ line tip at the cavoatrial junction level and similar tip position of a right chest port catheter. There is aortic atherosclerosis, stable mediastinum. Mild cardiomegaly. Central vessels are obscured. Dense bilateral airspace disease in the  lower 2/3 of both lungs has worsened, and today extends to involve the periphery of both lungs, previously was perihilar-predominant and not quite as confluent, with increasing small bilateral pleural effusions. The apical lungs remain clear. Bilateral shoulder  replacements are again noted. Osteopenia. IMPRESSION: 1. Bilateral worsening dense airspace disease right-greater-than-left, consistent with edema and/or pneumonia with small increased pleural effusions. 2. In all other respects no further changes.  Stable cardiac size. Electronically Signed   By: Telford Nab M.D.   On: 11/24/2021 07:24   DG CHEST PORT 1 VIEW  Result Date: 11/23/2021 CLINICAL DATA:  Central line placement EXAM: PORTABLE CHEST 1 VIEW COMPARISON:  11/23/2021 at 9:51 a.m. FINDINGS: Right Port-A-Cath tip: SVC. Left internal jugular central venous catheter tip: SVC. Endotracheal tube tip: 3.4 cm above the carina. Nasogastric tube: Enters the stomach. No pneumothorax. Continued dense perihilar airspace opacities with some obscuration of the hemidiaphragms. Bilateral shoulder arthroplasties. Bony demineralization. Atherosclerotic calcification of the aortic arch. IMPRESSION: 1. The new left IJ central venous catheter tip projects over the SVC. No pneumothorax. 2. New nasogastric tube enters the stomach and likely terminates in the stomach based on the position of the side port. 3. Stable dense perihilar and basilar airspace opacities. 4. The rest of the support apparatus appears satisfactorily positioned. Electronically Signed   By: Van Clines M.D.   On: 11/23/2021 18:10   Portable Chest x-ray  Result Date: 11/23/2021 CLINICAL DATA:  Endotracheal tube placement. EXAM: PORTABLE CHEST 1 VIEW COMPARISON:  One-view chest x-ray 11/23/2021 FINDINGS: Heart is enlarged. Diffuse interstitial and airspace opacities are similar the prior study. Bilateral pleural effusions are present. Right IJ Port-A-Cath is in place. Patient now intubated.  The endotracheal tube terminates 5 cm above the carina, in satisfactory position. Bilateral shoulder replacements noted. IMPRESSION: 1. Interval intubation. Satisfactory positioning of the endotracheal tube. 2. Stable diffuse interstitial and airspace opacities compatible with edema or infection. 3. Bilateral pleural effusions. Electronically Signed   By: San Morelle M.D.   On: 11/23/2021 10:27   DG Chest Port 1 View  Result Date: 11/23/2021 CLINICAL DATA:  Initial evaluation for increased shortness of breath. EXAM: PORTABLE CHEST 1 VIEW COMPARISON:  Prior radiograph from 11/17/2021. FINDINGS: Right-sided Port-A-Cath in place, stable. Cardiomegaly grossly unchanged. Mediastinal silhouette within normal limits. Lungs are hypoinflated. Extensive bilateral airspace disease involving predominantly the mid and lower lungs, progressed and worsened from previous, and likely reflecting worsened pulmonary edema. Superimposed small to moderate bilateral pleural effusions, also progressed and worsened. Probable superimposed bibasilar atelectasis. Superimposed infiltrates difficult to exclude. No pneumothorax. Visualized osseous structures are unchanged. Bilateral shoulder arthroplasties partially visualized. IMPRESSION: 1. Interval worsening in extensive bilateral airspace disease involving primarily the mid and lower lungs, likely reflecting worsened pulmonary edema. 2. Superimposed small to moderate bilateral pleural effusions, also progressed and worsened. Findings suggest progressive CHF. Electronically Signed   By: Jeannine Boga M.D.   On: 11/23/2021 02:52   DG Chest Port 1 View  Result Date: 12/06/2021 CLINICAL DATA:  Hypoxia, severe epigastric and abdominal pain. EXAM: PORTABLE CHEST 1 VIEW COMPARISON:  Radiographs dated October 30, 2021 FINDINGS: The heart is enlarged. Bilateral perihilar and lower lobe opacities concerning for airspace disease. Right IJ access Port-A-Cath with distal tip in the  SVC, unchanged. Bilateral shoulder arthroplasty. IMPRESSION: 1.  Stable cardiomegaly. 2. Bilateral perihilar and lower lobe hazy opacities, which may represent pulmonary edema or infiltrate. Clinical correlation is suggested. Electronically Signed   By: Keane Police D.O.   On: 11/28/2021 15:40   DG CHEST PORT 1 VIEW  Result Date: 10/30/2021 CLINICAL DATA:  Dyspnea EXAM: PORTABLE CHEST 1 VIEW COMPARISON:  Chest radiograph from one day prior. FINDINGS: Right internal jugular Port-A-Cath terminates in the middle third of  the SVC. Right shoulder hemiarthroplasty and partially visualized left shoulder total arthroplasty. Stable cardiomediastinal silhouette with mild cardiomegaly. No pneumothorax. No pleural effusion. Hazy streaky opacities in peripheral mid to lower left lung, similar. Largely resolved hazy right lung base opacities. IMPRESSION: 1. Largely resolved hazy right lung base opacities. 2. Stable hazy streaky opacities in the peripheral mid to lower left lung, favoring pneumonia. 3. Stable cardiomegaly. Electronically Signed   By: Ilona Sorrel M.D.   On: 10/30/2021 08:05   DG CHEST PORT 1 VIEW  Result Date: 10/29/2021 CLINICAL DATA:  76 year old male with history of shortness of breath. EXAM: PORTABLE CHEST 1 VIEW COMPARISON:  Chest x-ray 10/28/2021. FINDINGS: Right internal jugular single-lumen power porta cath with tip terminating in the mid superior vena cava. Lung volumes are slightly low. Diffuse peribronchial cuffing and widespread interstitial prominence, with patchy ill-defined peripheral predominant airspace disease, similar to the prior study. No pneumothorax. No suspicious appearing pulmonary nodules or masses are noted. Heart size is normal. Upper mediastinal contours are within normal limits. Status post bilateral shoulder arthroplasties. IMPRESSION: 1. The appearance the chest again suggests multilobar bilateral bronchopneumonia, with similar aeration compared to the prior examination. 2.  Postoperative changes and support apparatus, as above. Electronically Signed   By: Vinnie Langton M.D.   On: 10/29/2021 09:23   DG Abd Portable 1V  Result Date: 11/23/2021 CLINICAL DATA:  Placement enteric tube EXAM: PORTABLE ABDOMEN - 1 VIEW COMPARISON:  11/04/2015 FINDINGS: Tip of enteric tube is seen in the stomach. Bowel gas pattern in the upper abdomen is unremarkable. Heart is enlarged in size. There is prominence of pulmonary vessels along with interstitial and alveolar densities in both lungs, possibly suggesting pulmonary edema. Dextroscoliosis and degenerative changes are noted in lumbar spine. There is surgical fusion in the lumbar spine at L3-L4 level. IMPRESSION: Tip of enteric tube is seen in the stomach. Electronically Signed   By: Elmer Picker M.D.   On: 11/23/2021 13:38     Future Appointments  Date Time Provider [place]  Dec 16, 2021  9:00 AM Fredirick Maudlin, MD [facility]  12/04/2021  2:45 PM Baldwin Jamaica, PA-C [facility]  12/06/2021 10:00 AM [facility] [place] FLUSH [facility] None  12/06/2021 10:30 AM Dede Query T, PA-C [facility] None  12/06/2021 11:30 AM [facility] INFUSION [facility] None  12/06/2021  1:45 PM Morrell Riddle, RD [facility] None  12/08/2021  1:00 PM [facility] [place] FLUSH [facility] None  12/20/2021  9:30 AM [facility] [place] FLUSH [facility] None  12/20/2021 10:00 AM Orson Slick, MD [facility] None  12/20/2021 11:00 AM [facility] INFUSION [facility] None  12/22/2021  1:00 PM [facility] [place] FLUSH [facility] None  04/03/2022  1:00 PM Tat, Rebecca S, DO [facility] None      LOS: 5 days   Addendum I have seen the patient, examined him. I agree with the assessment and and plan and have edited the notes.   Pt remains to be critically ill, still on pressor, and mechanical ventilation.  Chart reviewed. His mild thrombocytopenia is probably related to his recent chemo.  I talked to his wife and daughter and they plan to withdraw  care on Wednesday if no further improvement. Given his underline metastatic esophageal cancer, I think that is very reasonable decision. I will f/u as needed. I appreciate the excellent ICU care.   Truitt Merle  11/27/2021

## 2021-11-27 NOTE — Progress Notes (Signed)
Removed commercial tube holder. No skin down noted. Placed Mepalix on the cheeks and lips. Secured the ETT tube with cloth tape at '@26'$ . Pt proned with RT x 2 and RN x 4 without any complications. Pt's head turned to left side. Pt is tolerating it well at this time.  ?

## 2021-11-27 NOTE — Procedures (Signed)
@   1710 The patient was turned on his back from the prone position. ET tube in place, 25 @ Lip. Patient saturation 95% and commercial ET tube holder placed. Patient is stable . RT will continue to monitor. ?

## 2021-11-27 NOTE — Progress Notes (Signed)
Daily Progress Note   Patient Name: Justin Trujillo.       Date: 11/27/2021 DOB: [birth year]  Age: 76 y.o. MRN#: [id number] Attending Physician: Chesley Mires, MD Primary Care Physician: Carol Ada, MD Admit Date: 12/10/2021  Reason for Consultation/Follow-up: Establishing goals of care  Subjective: I saw and examined Justin Trujillo.  I met with multiple family members including his wife and multiple children for a family meeting to discuss his care.  Values and goals of care important to patient and family were attempted to be elicited.  We discussed clinical course as well as wishes moving forward in regard to his care this hospitalization.  Discussed multiple issues including his underlying cancer, sepsis, pneumonia, and ARDS.  We discussed difference between a aggressive medical intervention path and a palliative, comfort focused care path.    Discussed that while it cannot be said that this is not something which he could survive, it would be a very long road ahead and likely include vastly different quality of life than he had enjoyed prior to his hospitalization.  Discussed that likely scenario if he survived would be needing trach and placement at [facility] or similar facility.  His family were very clear that he would not want that and that if he were to understand his situation, he would want to focus on comfort and dignity as he approaches end-of-life.     Questions and concerns addressed.   PMT will continue to support holistically.  Length of Stay: 5  Current Medications: Scheduled Meds:   artificial tears  1 application. Both Eyes Q8H   docusate  100 mg Per Tube BID   enoxaparin (LOVENOX) injection  40 mg Subcutaneous Q24H   feeding supplement (PROSource TF)  45 mL Per Tube QID    feeding supplement (VITAL 1.5 CAL)  1,000 mL Per Tube Q24H   Gerhardt's butt cream   Topical BID   insulin aspart  0-9 Units Subcutaneous Q4H   mouth rinse  15 mL Mouth Rinse 10 times per day   multivitamin  15 mL Per Tube Daily   nutrition supplement (JUVEN)  1 packet Per Tube BID BM   pantoprazole sodium  40 mg Per Tube BID   polyethylene glycol  17 g Per Tube Daily   sodium chloride flush  10-40 mL Intracatheter  Q12H    Continuous Infusions:  sodium chloride Stopped (11/27/21 1815)   sodium chloride 10 mL/hr at 11/27/21 1849   ceFEPime (MAXIPIME) IV Stopped (11/27/21 1433)   cisatracurium (NIMBEX) infusion 2 mg/mL Stopped (11/27/21 1731)   fentaNYL infusion INTRAVENOUS 225 mcg/hr (11/27/21 1849)   midazolam 3 mg/hr (11/27/21 1849)   phenylephrine (NEO-SYNEPHRINE) Adult infusion 40 mcg/min (11/27/21 1849)   vancomycin 150 mL/hr at 11/27/21 1849    PRN Meds: sodium chloride, acetaminophen **OR** acetaminophen, albuterol, docusate sodium, fentaNYL, lip balm, midazolam, polyethylene glycol, sodium chloride flush  Physical Exam         General: Intubated and sedated.  Prone positioning at time of my encounter Heart: Tachycardic Ext: Noted edema Skin: Warm and dry  Vital Signs: BP (!) 106/55 Comment: aline   Pulse 83    Temp (!) 96.8 F (36 C) (Axillary)    Resp 20    Ht $R'5\' 6"'hl$  (1.676 m)    Wt 80.1 kg    SpO2 93%    BMI 28.50 kg/m  SpO2: SpO2: 93 % O2 Device: O2 Device: Ventilator O2 Flow Rate: O2 Flow Rate (L/min): 40 L/min  Intake/output summary:  Intake/Output Summary (Last 24 hours) at 11/27/2021 1924 Last data filed at 11/27/2021 1849 Gross per 24 hour  Intake 3051.08 ml  Output 1600 ml  Net 1451.08 ml    LBM: Last BM Date : 11/25/21 Baseline Weight: Weight: 71 kg Most recent weight: Weight: 80.1 kg       Palliative Assessment/Data:    Flowsheet Rows    Flowsheet Row Most Recent Value  Intake Tab   Referral Department Critical care  Unit at Time of Referral  ICU  Palliative Care Primary Diagnosis Cancer  Date Notified 11/23/21  Reason for referral Clarify Goals of Care  Date of Admission 11/23/2021  Date first seen by Palliative Care 11/24/21  # of days Palliative referral response time 1 Day(s)  # of days IP prior to Palliative referral 1  Clinical Assessment   Palliative Performance Scale Score 10%  Psychosocial & Spiritual Assessment   Palliative Care Outcomes   Patient/Family meeting held? Yes  Who was at the meeting? Wife, son, daughter  Palliative Care Outcomes Clarified goals of care       Patient Active Problem List   Diagnosis Date Noted   ARDS (adult respiratory distress syndrome) ([place])    Pressure injury of skin 11/23/2021   Malnutrition of moderate degree 11/23/2021   Shock ([place]) 11/23/2021   Hypophosphatemia 10/28/2021   Diarrhea 10/27/2021   Hypokalemia 10/27/2021   Falls 10/26/2021   BPH (benign prostatic hyperplasia) 10/26/2021   Acute respiratory failure with hypoxia secondary to pneumonia 10/26/2021   History of COVID-19 10/26/2021   Falls secondary to weakness 10/26/2021   Pancytopenia due to antineoplastic chemotherapy ([place]) 10/26/2021   Elevated troponin 10/26/2021   Hyponatremia 10/26/2021   Acute respiratory failure with hypoxia ([place]) 10/26/2021   Hypotension 10/26/2021   Hypoalbuminemia 10/26/2021   Cardiomyopathy ([place]) 10/26/2021   Paroxysmal atrial fibrillation ([place]) 08/01/2021   Chemotherapy induced nausea and vomiting 07/16/2021   Vomiting without nausea    AKI (acute kidney injury) ([place]) 07/15/2021   Intractable nausea and vomiting 07/15/2021   Dehydration 44/81/8563   Metabolic acidosis, increased anion gap 07/15/2021   Port-A-Cath in place 06/07/2021   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[place]) 05/10/2021   Malignant neoplasm of lower third of esophagus ([place]) 03/07/2021   S/P reverse total shoulder arthroplasty, 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   Lumbar  stenosis with neurogenic claudication 10/28/2018   DM  type 2 (diabetes mellitus, type 2) ([place]) 01/15/2012   CAD (coronary artery disease) 01/15/2012   HTN (hypertension) 01/15/2012   Dyslipidemia 01/15/2012   COPD (chronic obstructive pulmonary disease); chronic bronchitis 01/15/2012   Tobacco abuse 01/15/2012   BMI 40.0-44.9, adult ([place]) 01/15/2012   OSA (obstructive sleep apnea) 01/15/2012   Insomnia 01/15/2012   IBS (irritable bowel syndrome) 01/15/2012   Hypogonadism male 01/15/2012   DJD (degenerative joint disease) 01/15/2012   Rotator cuff arthropathy, left 01/15/2012   GERD (gastroesophageal reflux disease) 01/15/2012   CHEST PAIN, ATYPICAL 07/12/2010   TOBACCO USER 10/20/2009   CAD, NATIVE VESSEL 09/29/2009   EMPHYSEMA 09/21/2009   Controlled type 2 diabetes mellitus with complication, without long-term current use of insulin ([place]) 08/31/2009   HYPERLIPIDEMIA 08/31/2009   OBSTRUCTIVE SLEEP APNEA 08/31/2009   Essential hypertension 08/31/2009   CHRONIC OBSTRUCTIVE PULMONARY DISEASE, ACUTE EXACERBATION 08/31/2009   DYSPNEA 08/31/2009    Palliative Care Assessment & Plan   Patient Profile: 76 y.o. male  with past medical history of metastatic adenocarcinoma of the distal esophagus, hypertension, COPD, OSA admitted on 11/20/2021 after developing abdominal pain and concern for anaphylaxis after receiving FOLFOX and nivolumab at the cancer center.  He has had this regimen multiple times before without problems but was transferred to the ED and had continued decline with respiratory failure, fever, and hypotension.  He was subsequently intubated and consideration this may be more of a sepsis picture with underlying pneumonia.  Palliative consulted for goals of care.  Recommendations/Plan: DNR Family in agreement that he would not want to continue long-term with aggressive interventions and we discussed plan for one-way extubation.  There are other family members who are coming to visit we also need time to allow Nimbex to  washout. Family is going to call and speak with other members and will let us know later today or tomorrow regarding timing of one-way extubation. Discussed with Dr. Halford Chessman.  Family was in agreement with plan to forego further prone sessions if it is not thought that it would add to his overall care moving forward with the understood goals of one-way extubation for comfort in the next couple of days.  Code Status:    Code Status Orders  (From admission, onward)           Start     Ordered   11/26/21 0845  Do not attempt resuscitation (DNR)  Continuous       Question Answer Comment  In the event of cardiac or respiratory ARREST Do not call a code blue   In the event of cardiac or respiratory ARREST Do not perform Intubation, CPR, defibrillation or ACLS   In the event of cardiac or respiratory ARREST Use medication by any route, position, wound care, and other measures to relive pain and suffering. May use oxygen, suction and manual treatment of airway obstruction as needed for comfort.   Comments full medical care ok      11/26/21 0844           Code Status History     Date Active Date Inactive Code Status Order ID Comments User Context   12/10/2021 1721 11/26/2021 0844 Full Code [id number]  Lestine Mount, PA-C ED   10/26/2021 1743 10/31/2021 0215 Full Code [id number]  Norval Morton, MD ED   10/26/2021 1722 10/26/2021 1743 Partial Code [id number]  Norval Morton, MD ED   07/15/2021 1549 07/21/2021 2105 Partial Code  [id number]  Orma Flaming, MD ED   03/26/2019 1341 03/27/2019 1327 Full Code [id number]  Marcellus Scott Inpatient   10/28/2018 1618 11/01/2018 1259 Full Code [id number]  Kristeen Miss, MD Inpatient      Advance Directive Documentation    Flowsheet Row Most Recent Value  Type of Advance Directive Living will  Pre-existing out of facility DNR order (yellow form or pink MOST form) --  "MOST" Form in Place? --       Prognosis: Guarded/poor  Discharge  Planning: Anticipated Hospital Death  Care plan was discussed with wife, sons, daughters, daughter-in-law, and son-in-law  Thank you for allowing the Palliative Medicine Team to assist in the care of this patient.   Total Time 60 Prolonged Time Billed No   Micheline Rough, MD  Please contact Palliative Medicine Team phone at [phone] for questions and concerns.

## 2021-11-27 NOTE — Progress Notes (Signed)
Nutrition Follow-up ? ?DOCUMENTATION CODES:  ? ?Non-severe (moderate) malnutrition in context of chronic illness ? ?INTERVENTION:  ?- continue Vital 1.5 @ 40 ml/hr with 45 ml Prosource TF QID and 1 packet Juven BID. ?- despite this exceeding estimated kcal need, TF has been on hold multiple times d/t changing from supine to prone positioning.  ? ? ?NUTRITION DIAGNOSIS:  ? ?Moderate Malnutrition related to chronic illness, cancer and cancer related treatments as evidenced by mild fat depletion, moderate muscle depletion. -ongoing ? ?GOAL:  ? ?Patient will meet greater than or equal to 90% of their needs -met with TF regimen ? ?MONITOR:  ? ?Vent status, TF tolerance, Labs, Weight trends, Skin ? ?ASSESSMENT:  ? 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 ? ?Discussed patient with RN prior to rounds and patient also discussed in rounds. Plan for Palliative Care to meet with family today around 1500. ? ?Patient was flipped from supine to prone today at ~0430 and is proning x16 hours/day. TF was placed on hold at that time and not resumed until shortly after the start of day shift. RN reports patient is tolerating TF regimen without issue. She shares that free water flush has been discontinued.  ? ?Patient remains intubated with OGT in place. He is receiving Vital 1.5 at goal rate of 40 ml/hr with 45 ml Prosource TF QID and 1 packet Juven BID. ? ?This regimen is providing 1790 kcal, 114 grams protein, and 731 ml free water.  ? ?Weight today is +10 lb compared to admission weight so admission weight (71 kg) used in estimating needs.  ? ?Moderate pitting edema to all extremities and non-pitting edema to face, perineal area, and sacral area noted in the edema section of flow sheet this AM.  ? ? ?Patient is  currently intubated on ventilator support ?MV: 7.5 L/min ?Temp (24hrs), Avg:97.7 ?F (36.5 ?C), Min:96.8 ?F (36 ?C), Max:98.5 ?F (36.9 ?C) ?Propofol: none ?BP: 97/45 and MAP: 61 ? ?Labs reviewed; CBGs: 171, 61, 109 mg/dl, BUN: 63 mg/dl, Ca: 7.7 mg/dl, Phos: 2.2 mg/dl, Alk Phos elevated ? ?Medications reviewed; 100 mg colace BID, sliding scale novolog, 15 ml multivitamin per OGT/day, 40 mg protonix per OGT BID, 17 g miralax/day, 20 mmol IV KPhos x1 run 3/13. ? ?Drips; nimbex @ 1.5 mcg/kg/min, neo @ 20 mcg/min, versed @ 2 mg/hr, fentanyl @ 225 mcg/hr. ? ? ?Diet Order:   ?Diet Order   ? ?       ?  Diet NPO time specified  Diet effective now       ?  ? ?  ?  ? ?  ? ? ?EDUCATION NEEDS:  ? ?No education needs have been identified at this time ? ?Skin:  Skin Assessment: Skin Integrity Issues: ?Skin Integrity Issues:: Stage I, Other (Comment) ?Stage I: sacrum ?Other: MASD bilateral groin ? ?Last BM:  3/12 (type 5, smear) ? ?Height:  ? ?Ht Readings from Last 1 Encounters:  ?12/03/2021 $RemoveBe'5\' 6"'qBlIekeWG$  (1.676 m)  ? ? ?Weight:  ? ?Wt Readings from Last 1 Encounters:  ?11/27/21 80.1 kg  ? ? ?BMI:  Body mass index is 28.5 kg/m?. ? ?Estimated Nutritional Needs:  ?Kcal:  1515 kcal ?Protein:  105-120g/day ?Fluid:  1.7-1.9L/day ? ? ? ? ?Jarome Matin, MS, RD, LDN ?Inpatient Clinical Dietitian ?RD pager #  available in [place]  ?After hours/weekend pager # available in [place] ? ?

## 2021-11-27 NOTE — Progress Notes (Addendum)
? ?NAME:  Justin Trujillo., MRN:  [id number], DOB:  May 08, 1946, LOS: 5 ?ADMISSION DATE:  11/17/2021, CONSULTATION DATE:  11/22/2021 ?REFERRING MD:  Dr. Eulis Foster, ER, CHIEF COMPLAINT:  Abdominal pain ? ?History of Present Illness:  ?76 yo male smoker presented from [place] to ER with severe epigastric pain, vomiting and dyspnea.  SpO2 in 80's on room air and hypotension.  He had received Folfox and nivolumab infusion for metastatic adenocarcinoma of the distal esophagus.   Concern he had anaphylactic reaction to chemo. ? ?Pertinent  Medical History  ?A fib, CAD, Depression, Tremor, HLD, HTN, OSA, Emphysema, DM type 2 ? ?Significant Hospital Events: ?Including procedures, antibiotic start and stop dates in addition to other pertinent events   ?3/08 Admit, concern for anaphylactic reaction to chemo, start ABx ?3/09 intubated, bronchoscopy, oncology consulted, Lt IJ CVL and Rt femoral arterial line placed ?3/10 ARDS protocol, start paralytics, start prone positioning; palliative care consulted ?3/12 meeting with palliative care >> DNR ? ?Interim History / Subjective:  ?Prone positioning again at 430 am today.  Remains on pressors, sedation, nimbex.  PEEP 12, FiO2 60%. ? ?Objective   ?Blood pressure (!) 106/55, pulse 100, temperature 97.6 ?F (36.4 ?C), temperature source Axillary, resp. rate 18, height '5\' 6"'$  (1.676 m), weight 80.1 kg, SpO2 94 %. ?   ?Vent Mode: PRVC ?FiO2 (%):  [60 %-70 %] 60 % ?Set Rate:  [20 bmp] 20 bmp ?Vt Set:  [380 mL] 380 mL ?PEEP:  [12 cmH20] 12 cmH20 ?Plateau Pressure:  [27 cmH20-29 cmH20] 29 cmH20  ? ?Intake/Output Summary (Last 24 hours) at 11/27/2021 0826 ?Last data filed at 11/27/2021 0800 ?Gross per 24 hour  ?Intake 2355.12 ml  ?Output 3200 ml  ?Net -844.88 ml  ? ?Filed Weights  ? 11/25/21 0409 11/26/21 0500 11/27/21 0500  ?Weight: 78.5 kg 81.3 kg 80.1 kg  ? ? ?Examination: ? ?General - sedated, medically paralyzed, on stomach ?Eyes - pupils reactive ?ENT - ETT in place ?Cardiac - regular  rate/rhythm, no murmur ?Chest - b/l rhonchi and inspiratory squeaks ?Abdomen - soft, decreased bowel sounds ?Extremities - no cyanosis, clubbing, or edema ?Skin - no rashes ?Neuro - RASS -5 ? ?Resolved Hospital Problem list   ?Hyponatremia, Lactic acidosis ? ?Assessment & Plan:  ? ?Acute hypoxia/hypercapnic respiratory failure with ARDS from pneumonia >> most likely infectious given increased procalcitonin, increased WBC, and Tm 102.66F. ?Hx of COPD, OSA, tobacco abuse. ?- continue prone position, nimbex for now ?- f/u CXR ?- goal SpO2  88 to 95%, PaO2 > 60 ?- goal Pplat < 30 ?- hold additional lasix for now ?- d/c solumedrol (was started when there was concern for anaphylaxis from chemo) ? ?Septic shock from pneumonia. ?- wean pressors to keep MAP > 65 ?- day 6 of Abx, currently on cefepime and vancomycin ? ?Acute metabolic encephalopathy 2nd to hypoxia/hypercapnia. ?- RASS goal -5 while on paralytic agents ? ?Metastatic esophageal cancer. ?- has metastatic disease to his skin ?- oncology consulted ? ?Paroxysmal atrial fibrillation (POA). ?Hx of CAD, HTN, HLD. ?- consider restarting eliquis on 3/14 ? 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 ?- SSI ? ?Anemia of critical illness and chronic disease. ?Thrombocytopenia. ?- f/u CBC ? ?Moderate protein calorie malnutrition. ?- tube feeds ? ?Pressure injury. ?- sacrum, stage 1, POA ? ?Goals of care. ?- DNR ?- palliative care consulted >> follow up family meeting planned for 3/13 ? ?Best Practice (right click and "Reselect all SmartList Selections" daily)  ? ?Diet/type: tubefeeds ?  DVT prophylaxis: LMWH ?GI prophylaxis: PPI ?Lines: Central line ?Foley:  Yes, and it is still needed ?Code Status:  DNR ?Last date of multidisciplinary goals of care discussion '[x]'$  ? ?Labs   ? ?CMP Latest Ref Rng & Units 11/27/2021 11/26/2021 11/25/2021  ?Glucose 70 - 99 mg/dL 164(H) 198(H) 110(H)  ?BUN 8 - 23 mg/dL 63(H) 43(H) 35(H)  ?Creatinine 0.61 - 1.24 mg/dL 1.08 0.79 0.81  ?Sodium 135  - 145 mmol/L 136 132(L) 129(L)  ?Potassium 3.5 - 5.1 mmol/L 4.7 4.0 3.4(L)  ?Chloride 98 - 111 mmol/L 101 99 95(L)  ?CO2 22 - 32 mmol/L '29 26 26  '$ ?Calcium 8.9 - 10.3 mg/dL 7.7(L) 7.4(L) 6.8(L)  ?Total Protein 6.5 - 8.1 g/dL 4.8(L) 4.4(L) -  ?Total Bilirubin 0.3 - 1.2 mg/dL 0.3 0.5 -  ?Alkaline Phos 38 - 126 U/L 133(H) 112 -  ?AST 15 - 41 U/L 27 34 -  ?ALT 0 - 44 U/L 21 19 -  ? ? ?CBC Latest Ref Rng & Units 11/27/2021 11/26/2021 11/26/2021  ?WBC 4.0 - 10.5 K/uL 25.1(H) 20.7(H) 20.8(H)  ?Hemoglobin 13.0 - 17.0 g/dL 10.5(L) 10.3(L) 10.1(L)  ?Hematocrit 39.0 - 52.0 % 34.3(L) 32.5(L) 31.4(L)  ?Platelets 150 - 400 K/uL 111(L) 101(L) 123(L)  ? ? ?ABG ?   ?Component Value Date/Time  ? PHART 7.26 (L) 11/27/2021 0430  ? PCO2ART 65 (H) 11/27/2021 0430  ? PO2ART 104 11/27/2021 0430  ? HCO3 29.6 (H) 11/27/2021 0430  ? TCO2 24 10/26/2021 1106  ? ACIDBASEDEF 4.3 (H) 11/24/2021 0302  ? O2SAT 99.1 11/27/2021 0430  ? ? ?CBG (last 3)  ?Recent Labs  ?  11/26/21 ?2348 11/27/21 ?9381 11/27/21 ?0842  ?GLUCAP 208* 171* 61*  ? ? ?Critical care time: 43 minutes  ?Chesley Mires, MD ?[place] ?Pager - [phone] ?11/27/2021, 8:57 AM ? ? ? ? ? ?

## 2021-11-27 NOTE — Plan of Care (Signed)
  Problem: Nutrition: Goal: Adequate nutrition will be maintained Outcome: Progressing   Problem: Elimination: Goal: Will not experience complications related to urinary retention Outcome: Progressing   Problem: Pain Managment: Goal: General experience of comfort will improve Outcome: Progressing   

## 2021-11-27 NOTE — Discharge Planning (Signed)
Oncology Discharge Planning Admission Note  [facility] at [facility] Address: [address], [place], [place] [address] Hours of Operation:  Nena Polio, Monday - Friday  Clinic Contact Information:  [phone]) [phone]  Oncology Care Team: Medical Oncologist:  Dr. Octavio Graves - NP is aware of this hospital admission dated 12/13/2021. The cancer center will follow [facility] inpatient care to assist with discharge planning as indicated by the oncologist.  We will arrange for follow up closer to discharge.  Disclaimer:  This [facility] note does not imply a formal consult request has been made by the admitting attending for this admission or there will be an inpatient consult completed by oncology.  Please request oncology consults as per standard process as indicated.

## 2021-11-27 NOTE — Procedures (Incomplete)
Vascular called this am about the ordered BLE venous ultrasound. Pt is in the prone position currently and vascular is unable to do this scan while pt is in the prone position. CCM is aware. ?

## 2021-11-27 NOTE — Progress Notes (Signed)
Patient is prone at this time. RT did not turn neck to other position due to patient having neck surgery  and this makes it hard to turn the other direction. He is stable in this position . RT to further assess at next round ?

## 2021-11-27 NOTE — Progress Notes (Signed)
Vascular called this am about the BLE venous scan that was ordered. Pt is currently in the prone position and vascular is unable to complete this exam with the patient in this position. CCM is aware. ?

## 2021-11-28 ENCOUNTER — Inpatient Hospital Stay (HOSPITAL_COMMUNITY): Payer: PPO

## 2021-11-28 ENCOUNTER — Encounter (HOSPITAL_COMMUNITY): Payer: PPO

## 2021-11-28 DIAGNOSIS — R579 Shock, unspecified: Secondary | ICD-10-CM | POA: Diagnosis not present

## 2021-11-28 DIAGNOSIS — J80 Acute respiratory distress syndrome: Secondary | ICD-10-CM | POA: Diagnosis not present

## 2021-11-28 LAB — BLOOD GAS, ARTERIAL
Acid-Base Excess: 0.4 mmol/L (ref 0.0–2.0)
Bicarbonate: 27.7 mmol/L (ref 20.0–28.0)
Drawn by: 23281
FIO2: 50 %
O2 Saturation: 95.1 %
PEEP: 12 cmH2O
Patient temperature: 36.7
RATE: 20 resp/min
Spontaneous VT: 380 mL
pCO2 arterial: 54 mmHg — ABNORMAL HIGH (ref 32–48)
pH, Arterial: 7.31 — ABNORMAL LOW (ref 7.35–7.45)
pO2, Arterial: 69 mmHg — ABNORMAL LOW (ref 83–108)

## 2021-11-28 LAB — CULTURE, RESPIRATORY W GRAM STAIN

## 2021-11-28 LAB — GLUCOSE, CAPILLARY
Glucose-Capillary: 158 mg/dL — ABNORMAL HIGH (ref 70–99)
Glucose-Capillary: 166 mg/dL — ABNORMAL HIGH (ref 70–99)

## 2021-11-28 LAB — CBC
HCT: 33.8 % — ABNORMAL LOW (ref 39.0–52.0)
Hemoglobin: 10.6 g/dL — ABNORMAL LOW (ref 13.0–17.0)
MCH: 33.2 pg (ref 26.0–34.0)
MCHC: 31.4 g/dL (ref 30.0–36.0)
MCV: 106 fL — ABNORMAL HIGH (ref 80.0–100.0)
Platelets: 107 10*3/uL — ABNORMAL LOW (ref 150–400)
RBC: 3.19 MIL/uL — ABNORMAL LOW (ref 4.22–5.81)
RDW: 16.4 % — ABNORMAL HIGH (ref 11.5–15.5)
WBC: 25.3 10*3/uL — ABNORMAL HIGH (ref 4.0–10.5)
nRBC: 0 % (ref 0.0–0.2)

## 2021-11-28 LAB — BASIC METABOLIC PANEL
Anion gap: 7 (ref 5–15)
BUN: 71 mg/dL — ABNORMAL HIGH (ref 8–23)
CO2: 24 mmol/L (ref 22–32)
Calcium: 7.8 mg/dL — ABNORMAL LOW (ref 8.9–10.3)
Chloride: 105 mmol/L (ref 98–111)
Creatinine, Ser: 1 mg/dL (ref 0.61–1.24)
GFR, Estimated: >60 mL/min (ref >60)
Glucose, Bld: 154 mg/dL — ABNORMAL HIGH (ref 70–99)
Potassium: 3.8 mmol/L (ref 3.5–5.1)
Sodium: 136 mmol/L (ref 135–145)

## 2021-11-28 LAB — PHOSPHORUS: Phosphorus: 1.9 mg/dL — ABNORMAL LOW (ref 2.5–4.6)

## 2021-11-28 NOTE — Progress Notes (Addendum)
PCCM Progress Note ? ?Summary: ?76 yo male smoker presented from [place] to ER with severe epigastric pain, vomiting and dyspnea.  SpO2 in 80's on room air and hypotension.  He had received Folfox and nivolumab infusion for metastatic adenocarcinoma of the distal esophagus.   Concern he had anaphylactic reaction to chemo.  He was started on solumedrol, pepcid, and benadryl.  Also started on antibiotics.  On 3/09 he required intubation and had bronchoscopy done.  On 3/10 he was started on ARDS protocol, nimbex, and prone positioning.  Palliative care consulted.  Family opted for DNR status on 3/12.  After family meeting with palliative care on 3/13, family then decided to not escalate care and arrange for comfort measures on 3/15. ? ?Subjective: ?Put pack in supine position yesterday.  Remains on sedation. ? ?Vital signs: ?BP (!) 108/48   Pulse (!) 101   Temp 98.5 ?F (36.9 ?C) (Axillary)   Resp 20   Ht '5\' 6"'$  (1.676 m)   Wt 82.7 kg   SpO2 94%   BMI 29.43 kg/m?  ? ?Physical exam: ?General - sedated ?Eyes - pupils reactive ?ENT - ETT in place ?Cardiac - regular, tachycardic ?Chest - b/l crackles ?Abdomen - soft, non tender, decreased bowel sounds ?Extremities - 1+ edema ?Skin - sacral pressure wound ?Neuro - RASS -3 ? ?Labs: ?ABG ?   ?Component Value Date/Time  ? PHART 7.31 (L) 11/28/2021 0320  ? PCO2ART 54 (H) 11/28/2021 0320  ? PO2ART 69 (L) 11/28/2021 0320  ? HCO3 27.7 11/28/2021 0320  ? TCO2 24 10/26/2021 1106  ? ACIDBASEDEF 4.3 (H) 11/24/2021 0302  ? O2SAT 95.1 11/28/2021 0320  ? ? ?CBC Latest Ref Rng & Units 11/28/2021 11/27/2021 11/26/2021  ?WBC 4.0 - 10.5 K/uL 25.3(H) 25.1(H) 20.7(H)  ?Hemoglobin 13.0 - 17.0 g/dL 10.6(L) 10.5(L) 10.3(L)  ?Hematocrit 39.0 - 52.0 % 33.8(L) 34.3(L) 32.5(L)  ?Platelets 150 - 400 K/uL 107(L) 111(L) 101(L)  ? ? ?CMP Latest Ref Rng & Units 11/28/2021 11/27/2021 11/26/2021  ?Glucose 70 - 99 mg/dL 154(H) 164(H) 198(H)  ?BUN 8 - 23 mg/dL 71(H) 63(H) 43(H)  ?Creatinine 0.61 - 1.24  mg/dL 1.00 1.08 0.79  ?Sodium 135 - 145 mmol/L 136 136 132(L)  ?Potassium 3.5 - 5.1 mmol/L 3.8 4.7 4.0  ?Chloride 98 - 111 mmol/L 105 101 99  ?CO2 22 - 32 mmol/L '24 29 26  '$ ?Calcium 8.9 - 10.3 mg/dL 7.8(L) 7.7(L) 7.4(L)  ?Total Protein 6.5 - 8.1 g/dL - 4.8(L) 4.4(L)  ?Total Bilirubin 0.3 - 1.2 mg/dL - 0.3 0.5  ?Alkaline Phos 38 - 126 U/L - 133(H) 112  ?AST 15 - 41 U/L - 27 34  ?ALT 0 - 44 U/L - 21 19  ? ? ?CBG (last 3)  ?Recent Labs  ?  11/27/21 ?2312 11/28/21 ?0867 11/28/21 ?0751  ?GLUCAP 156* 158* 166*  ? ? ?Assessment: ?Acute hypoxia/hypercapnic respiratory failure with ARDS from pneumonia >> most likely infectious given increased procalcitonin, increased WBC, and Tm 102.28F. ?Hx of COPD, OSA, tobacco abuse. ?Septic shock from pneumonia. ?Acute metabolic encephalopathy 2nd to hypoxia/hypercapnia. ?Metastatic esophageal cancer. ?Paroxysmal atrial fibrillation (POA). ?Hx of CAD, HTN, HLD. 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 ?Anemia of critical illness and chronic disease. ?Thrombocytopenia. ?Moderate protein calorie malnutrition. ?Pressure injury, sacrum, stage 1, POA. ? ?Plan: ?- appreciate assistance from palliative care ?- DNR, no escalation of care ?- continue ABx and tube feeds for now ?- defer additional lab tests and imaging studies ?- d/c CBG checks ?- d/c  SCDs ?- defer additional prone positioning ?- continue sedation and titrate for comfort ?- continue vent support for now ?- family planning to visit on 3/14 and then transition to comfort measures on 3/15 ? ?Chesley Mires, MD ?[place] ?Pager - [phone] ?11/28/2021, 8:24 AM ? ? ? ?

## 2021-11-28 NOTE — Progress Notes (Signed)
PMT brief progress note.  ? ?Patient seen briefly, remains on the vent, chart reviewed, discussed with nursing colleagues as well. Call placed and discussed with wife, she states that her entire family is taking turns to come be with the patient.  ?BP (!) 108/48   Pulse (!) 105   Temp 98.7 ?F (37.1 ?C) (Axillary)   Resp 16   Ht '5\' 6"'$  (1.676 m)   Wt 82.7 kg   SpO2 93%   BMI 29.43 kg/m?  ?Chart reviewed.  ?Plan: ?Discussed with patient's wife on the phone about his current condition and about comfort measures. Patient's wife states that she is requesting time for one way extubation tomorrow to be at or around 1300 on 12/22/2021.  ?15 minutes spent.  ?Loistine Chance MD ?[facility].  ?  ? ?

## 2021-11-28 NOTE — Progress Notes (Signed)
Pharmacy Antibiotic Note ? ?Justin Trujillo. is a 76 y.o. male admitted on 11/21/2021 with sepsis and Pharmacy was consulted for cefepime and vancomycin dosing. ? ?Today, patient is afebrile, WBC remains elevated, serum creatinine down to 0.81.  MRSA PCR collected on 3/8 but not resulted. ? ?Plan: ?Change cefepime from 2 g IV q12h to 2 g IV q8h ?Change vancomycin back to 1500 mg IV q24h (Goal AUC: 400-550. Expected AUC: 424.6. SCr used: 0.81) ?  ? ? ?Height: _0  (167.6 cm) ?Weight: 82.7 kg (182 lb 5.1 oz) ?IBW/kg (Calculated) : 63.8 ? ?Temp (24hrs), Avg:97.8 ?F (36.6 ?C), Min:96.8 ?F (36 ?C), Max:98.7 ?F (37.1 ?C) ? ?Recent Labs  ?Lab 11/23/21 ?1647 11/24/21 ?0302 11/24/21 ?1103 11/24/21 ?1303 11/25/21 ?0354 11/26/21 ?0141 11/26/21 ?8118 11/26/21 ?1613 11/27/21 ?8677 11/27/21 ?0430 11/28/21 ?3736  ?WBC  --  29.6*  --   --  24.8* 20.8*  --  20.7* 25.1*  --  25.3*  ?CREATININE 1.04 1.18  --   --  0.81  --  0.79  --   --  1.08 1.00  ?LATICACIDVEN 4.2*  --  2.8* 2.8*  --   --  2.6*  --   --  1.4  --   ? ?  ?Estimated Creatinine Clearance: 63.5 mL/min (by C-G formula based on SCr of 1 mg/dL).   ? ?Allergies  ?Allergen Reactions  ? Iodinated Contrast Media Anaphylaxis  ? Moxifloxacin Swelling  ?  REACTION: GI upset, throat "tightened up"  ? Penicillins Anaphylaxis  ?  Remote anaphylaxis reaction (not childhood) requiring MD/hospital care ?- tolerates Ceftin, Rocephin, Cefepime ?  ? Shellfish-Derived Products Anaphylaxis  ? Amoxicillin Swelling  ?  Throat tightened up ?Tolerates Ceftin, Rocephin, Cefepime  ? Chlorhexidine Other (See Comments)  ?  Wife is not aware of this allergy  ? Latex Rash  ?  Reaction to prolonged exposure  ? ? ?Antimicrobials this admission: ?3/8 vancomycin >>  ?3/8 cefepime >>  ? ?Dose adjustments this admission: ?3/8 vancomycin 1500 mg q24h + cefepime 2 g q8h ?3/9 vancomycin 1250 mg q24h + cefepime 2 g q12h ?3/11 vancomycin 1500 mg q24h + cefepime 2 g q8h ? ?Microbiology results: ?3/11 BCx:  NGTD ?3/9 UCx: insignificant growth  ?3/9 respiratory from bronchoalveolar lavage: ngtd ?3/8 MRSA PCR: not resulted ?3/9 AFB: neg ?3/9 Acid Fast culture: sent ?3/9 fungus culture with stain: sent ?3/9 pneumocystis smea: neg ?3/11 MRSA PCR: ordered ? ?Thank you for allowing pharmacy to be a part of this patient?s care. ? ?Royetta Asal, PharmD, BCPS ?11/28/2021 1:45 PM ? ? ?

## 2021-11-28 NOTE — Progress Notes (Signed)
Upon exiting my other patients room I noticed that patient appeared via vital signs to be coming out of sedation. He also was breathing over the ventilator/double stacking every three breaths.BP was slightly elevated from prior readings. ? ?Sp02: 89% down from 94% ?BIS: 90 up from 42 ?RR:  23  up from 20 ? ?All lines infusing medications were checked for obstruction, ETT inline suction performed. Fentanyl and versed lines reversed since fentanyl is running at a quicker rate, and fentanyl rapid bolus given per written order. 02 breaths given with an improvement in o2 saturation up to 93%. ? ?Spoke with RT Tabitha to get permission for increasing ventilator fio2 if necessary. Documented increase at 2103 from 70% to 80% with an improvement in o2 saturation to 97%. Will continue to assess for change in status or condition.  ?

## 2021-11-29 ENCOUNTER — Encounter (HOSPITAL_BASED_OUTPATIENT_CLINIC_OR_DEPARTMENT_OTHER): Payer: PPO | Admitting: General Surgery

## 2021-11-29 DIAGNOSIS — Z515 Encounter for palliative care: Secondary | ICD-10-CM

## 2021-11-29 DIAGNOSIS — R579 Shock, unspecified: Secondary | ICD-10-CM | POA: Diagnosis not present

## 2021-11-29 DIAGNOSIS — J80 Acute respiratory distress syndrome: Secondary | ICD-10-CM | POA: Diagnosis not present

## 2021-11-29 MED ORDER — GLYCOPYRROLATE 0.2 MG/ML IJ SOLN
0.2000 mg | INTRAMUSCULAR | Status: DC | PRN
  Filled 2021-11-29: qty 1

## 2021-11-29 MED ORDER — PHENYLEPHRINE CONCENTRATED 100MG/250ML (0.4 MG/ML) INFUSION SIMPLE
0.0000 ug/min | INTRAVENOUS | Status: DC
  Administered 2021-11-29: 80 ug/min via INTRAVENOUS
  Filled 2021-11-29: qty 250

## 2021-12-04 ENCOUNTER — Ambulatory Visit: Payer: PPO | Admitting: Physician Assistant

## 2021-12-06 ENCOUNTER — Inpatient Hospital Stay: Payer: PPO

## 2021-12-06 ENCOUNTER — Inpatient Hospital Stay: Payer: PPO | Admitting: Physician Assistant

## 2021-12-06 ENCOUNTER — Inpatient Hospital Stay: Payer: PPO | Admitting: Dietician

## 2021-12-08 ENCOUNTER — Inpatient Hospital Stay: Payer: PPO

## 2021-12-16 NOTE — Progress Notes (Signed)
RT NOTE:  Pt extubated to comfort care measures per MD order and family request.  

## 2021-12-16 NOTE — Progress Notes (Signed)
PCCM Progress Note ? ?Summary: ?76 yo male smoker presented from [place] to ER with severe epigastric pain, vomiting and dyspnea.  SpO2 in 80's on room air and hypotension.  He had received Folfox and nivolumab infusion for metastatic adenocarcinoma of the distal esophagus.   Concern he had anaphylactic reaction to chemo.  He was started on solumedrol, pepcid, and benadryl.  Also started on antibiotics.  On 3/09 he required intubation and had bronchoscopy done.  On 3/10 he was started on ARDS protocol, nimbex, and prone positioning.  Palliative care consulted.  Family opted for DNR status on 3/12.  After family meeting with palliative care on 3/13, family then decided to not escalate care and arrange for comfort measures on 3/15. ? ?Subjective: ?Remains on vent, sedation. ? ?Vital signs: ?BP (!) 108/48   Pulse (!) 109   Temp 98.6 ?F (37 ?C) (Axillary)   Resp (!) 23   Ht '5\' 6"'$  (1.676 m)   Wt 82.7 kg   SpO2 90%   BMI 29.43 kg/m?  ? ?Physical exam: ?General - sedated ?Eyes - pupils reactive ?ENT - ETT in place ?Cardiac - regular, tachycardic ?Chest - b/l crackles ?Abdomen - soft, non tender, decreased bowel sounds ?Extremities - 1+ edema ?Skin - sacral pressure wound ?Neuro - RASS -3 ? ?Labs: ?ABG ?   ?Component Value Date/Time  ? PHART 7.31 (L) 11/28/2021 0320  ? PCO2ART 54 (H) 11/28/2021 0320  ? PO2ART 69 (L) 11/28/2021 0320  ? HCO3 27.7 11/28/2021 0320  ? TCO2 24 10/26/2021 1106  ? ACIDBASEDEF 4.3 (H) 11/24/2021 0302  ? O2SAT 95.1 11/28/2021 0320  ? ? ?CBC Latest Ref Rng & Units 11/28/2021 11/27/2021 11/26/2021  ?WBC 4.0 - 10.5 K/uL 25.3(H) 25.1(H) 20.7(H)  ?Hemoglobin 13.0 - 17.0 g/dL 10.6(L) 10.5(L) 10.3(L)  ?Hematocrit 39.0 - 52.0 % 33.8(L) 34.3(L) 32.5(L)  ?Platelets 150 - 400 K/uL 107(L) 111(L) 101(L)  ? ? ?CMP Latest Ref Rng & Units 11/28/2021 11/27/2021 11/26/2021  ?Glucose 70 - 99 mg/dL 154(H) 164(H) 198(H)  ?BUN 8 - 23 mg/dL 71(H) 63(H) 43(H)  ?Creatinine 0.61 - 1.24 mg/dL 1.00 1.08 0.79  ?Sodium 135  - 145 mmol/L 136 136 132(L)  ?Potassium 3.5 - 5.1 mmol/L 3.8 4.7 4.0  ?Chloride 98 - 111 mmol/L 105 101 99  ?CO2 22 - 32 mmol/L '24 29 26  '$ ?Calcium 8.9 - 10.3 mg/dL 7.8(L) 7.7(L) 7.4(L)  ?Total Protein 6.5 - 8.1 g/dL - 4.8(L) 4.4(L)  ?Total Bilirubin 0.3 - 1.2 mg/dL - 0.3 0.5  ?Alkaline Phos 38 - 126 U/L - 133(H) 112  ?AST 15 - 41 U/L - 27 34  ?ALT 0 - 44 U/L - 21 19  ? ? ?CBG (last 3)  ?Recent Labs  ?  11/27/21 ?2312 11/28/21 ?2248 11/28/21 ?0751  ?GLUCAP 156* 158* 166*  ? ? ?Assessment: ?Acute hypoxia/hypercapnic respiratory failure with ARDS from pneumonia >> most likely infectious given increased procalcitonin, increased WBC, and Tm 102.39F. ?Hx of COPD, OSA, tobacco abuse. ?Septic shock from pneumonia. ?Acute metabolic encephalopathy 2nd to hypoxia/hypercapnia. ?Metastatic esophageal cancer. ?Paroxysmal atrial fibrillation (POA). ?Hx of CAD, HTN, HLD. 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 ?Anemia of critical illness and chronic disease. ?Thrombocytopenia. ?Moderate protein calorie malnutrition. ?Pressure injury, sacrum, stage 1, POA. ? ?Plan: ?- appreciate assistance from palliative care ?- DNR, no escalation of care ?- d/c tube feeds, ABx ?- continue sedation, and titrate for comfort ?- family planning to transition to comfort care with removal of vent support at around  1 pm on 3/15 ? ?Justin Mires, MD ?[place] ?Pager - [phone] ?12-03-21, 8:34 AM ? ? ? ?

## 2021-12-16 NOTE — Progress Notes (Signed)
? ?                                                                                                                                                     ?                                                   ?Daily Progress Note  ? ?Patient Name: Justin Trujillo.       Date: 2021/12/09 ?DOB: 11/17/1945  Age: 76 y.o. MRN#: [id number] ?Attending Physician: Chesley Mires, MD ?Primary Care Physician: Carol Ada, MD ?Admit Date: 12/05/2021 ? ?Reason for Consultation/Follow-up: Establishing goals of care ? ?Subjective: ?I saw and examined Mr. Barco. ? ?I met with multiple family members including his wife and multiple children for a family meeting prior to his one-way extubation and comfort focused care    ?Questions and concerns addressed.   PMT will continue to support holistically. ? ?Length of Stay: 7 ? ?Current Medications: ?Scheduled Meds:  ? artificial tears  1 application. Both Eyes Q8H  ? enoxaparin (LOVENOX) injection  40 mg Subcutaneous Q24H  ? Gerhardt's butt cream   Topical BID  ? mouth rinse  15 mL Mouth Rinse 10 times per day  ? pantoprazole sodium  40 mg Per Tube BID  ? sodium chloride flush  10-40 mL Intracatheter Q12H  ? ? ?Continuous Infusions: ? sodium chloride Stopped (11/27/21 2200)  ? sodium chloride 10 mL/hr at December 09, 2021 1100  ? fentaNYL infusion INTRAVENOUS 300 mcg/hr (12/09/21 1100)  ? midazolam 2 mg/hr (2021/12/09 1100)  ? phenylephrine (NEO-SYNEPHRINE) Adult infusion 80 mcg/min (12/09/2021 1100)  ? ? ?PRN Meds: ?sodium chloride, acetaminophen **OR** acetaminophen, albuterol, fentaNYL, glycopyrrolate, lip balm, midazolam, sodium chloride flush ? ?Physical Exam         ?General: Intubated and sedated.    ?Heart: Tachycardic ?Ext: Noted edema ?Skin: Warm and dry ? ?Vital Signs: BP (!) 108/48   Pulse (!) 106   Temp 98.6 ?F (37 ?C) (Axillary)   Resp (!) 21   Ht $R'5\' 6"'Fj$  (1.676 m)   Wt 82.7 kg   SpO2 94%   BMI 29.43 kg/m?  ?SpO2: SpO2: 94 % ?O2 Device: O2 Device: Ventilator ?O2 Flow Rate: O2 Flow Rate  (L/min): 40 L/min ? ?Intake/output summary:  ?Intake/Output Summary (Last 24 hours) at 2021/12/09 1323 ?Last data filed at 12/09/2021 1100 ?Gross per 24 hour  ?Intake 3605.09 ml  ?Output 1150 ml  ?Net 2455.09 ml  ? ? ?LBM: Last BM Date : 09-Dec-2021 ?Baseline Weight: Weight: 71 kg ?Most recent weight: Weight: 82.7 kg ? ?     ?Palliative Assessment/Data: ? ? ? ?Flowsheet Rows   ? ?Flowsheet Row  Most Recent Value  ?Intake Tab   ?Referral Department Critical care  ?Unit at Time of Referral ICU  ?Palliative Care Primary Diagnosis Cancer  ?Date Notified 11/23/21  ?Reason for referral Clarify Goals of Care  ?Date of Admission 12/02/2021  ?Date first seen by Palliative Care 11/24/21  ?# of days Palliative referral response time 1 Day(s)  ?# of days IP prior to Palliative referral 1  ?Clinical Assessment   ?Palliative Performance Scale Score 10%  ?Psychosocial & Spiritual Assessment   ?Palliative Care Outcomes   ?Patient/Family meeting held? Yes  ?Who was at the meeting? Wife, son, daughter  ?Palliative Care Outcomes Clarified goals of care  ? ?  ? ? ?Patient Active Problem List  ? Diagnosis Date Noted  ? ARDS (adult respiratory distress syndrome) ([place])   ? Pressure injury of skin 11/23/2021  ? Malnutrition of moderate degree 11/23/2021  ? Shock ([place]) 11/24/2021  ? Hypophosphatemia 10/28/2021  ? Diarrhea 10/27/2021  ? Hypokalemia 10/27/2021  ? Falls 10/26/2021  ? BPH (benign prostatic hyperplasia) 10/26/2021  ? Acute respiratory failure with hypoxia secondary to pneumonia 10/26/2021  ? History of COVID-19 10/26/2021  ? Falls secondary to weakness 10/26/2021  ? Pancytopenia due to antineoplastic chemotherapy ([place]) 10/26/2021  ? Elevated troponin 10/26/2021  ? Hyponatremia 10/26/2021  ? Acute respiratory failure with hypoxia ([place]) 10/26/2021  ? Hypotension 10/26/2021  ? Hypoalbuminemia 10/26/2021  ? Cardiomyopathy ([place]) 10/26/2021  ? Paroxysmal atrial fibrillation ([place]) 08/01/2021  ? Chemotherapy induced nausea and vomiting  07/16/2021  ? Vomiting without nausea   ? AKI (acute kidney injury) ([place]) 07/15/2021  ? Intractable nausea and vomiting 07/15/2021  ? Dehydration 07/15/2021  ? Metabolic acidosis, increased anion gap 07/15/2021  ? Port-A-Cath in place 06/07/2021  ? 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 Malignant neoplasm of lower third of esophagus ([place]) 03/07/2021  ? S/P reverse total shoulder arthroplasty, 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  ? Lumbar stenosis with neurogenic claudication 10/28/2018  ? DM type 2 (diabetes mellitus, type 2) ([place]) 01/15/2012  ? CAD (coronary artery disease) 01/15/2012  ? HTN (hypertension) 01/15/2012  ? Dyslipidemia 01/15/2012  ? COPD (chronic obstructive pulmonary disease); chronic bronchitis 01/15/2012  ? Tobacco abuse 01/15/2012  ? BMI 40.0-44.9, adult ([place]) 01/15/2012  ? OSA (obstructive sleep apnea) 01/15/2012  ? Insomnia 01/15/2012  ? IBS (irritable bowel syndrome) 01/15/2012  ? Hypogonadism male 01/15/2012  ? DJD (degenerative joint disease) 01/15/2012  ? Rotator cuff arthropathy, left 01/15/2012  ? GERD (gastroesophageal reflux disease) 01/15/2012  ? CHEST PAIN, ATYPICAL 07/12/2010  ? TOBACCO USER 10/20/2009  ? CAD, NATIVE VESSEL 09/29/2009  ? EMPHYSEMA 09/21/2009  ? Controlled type 2 diabetes mellitus with complication, without long-term current use of insulin ([place]) 08/31/2009  ? HYPERLIPIDEMIA 08/31/2009  ? OBSTRUCTIVE SLEEP APNEA 08/31/2009  ? Essential hypertension 08/31/2009  ? CHRONIC OBSTRUCTIVE PULMONARY DISEASE, ACUTE EXACERBATION 08/31/2009  ? DYSPNEA 08/31/2009  ? ? ?Palliative Care Assessment & Plan  ? ?Patient Profile: ?76 y.o. male  with past medical history of metastatic adenocarcinoma of the distal esophagus, hypertension, COPD, OSA admitted on 12/06/2021 after developing abdominal pain and concern for anaphylaxis after receiving FOLFOX and nivolumab at the cancer center.  He has had this regimen multiple times before without problems but was transferred to the ED and had  continued decline with respiratory failure, fever, and hypotension.  He was subsequently intubated and consideration this may be more of a sepsis picture with underlying pneumonia.  Palliative consulted for goals of care. ? ?Recommendations/Plan: ?DNR ?  Family meeting was held.  We discussed about patient's current condition.  We discussed about concept of comfort measures only.  We discussed about extubation, one-way extubation and full establishment of comfort measures.  Family has anticipatory grief.  They are being supported by their church community.  Discussed about continuing fentanyl as well as Versed infusion.  Discussed with bedside RN.  Extubated 2 L nasal cannula for comfort measures and for supportive care this afternoon.   ?Prognosis likely limited to minutes to hours post extubation.   ? ?Code Status: ? ?  ?Code Status Orders  ?(From admission, onward)  ?  ? ? ?  ? ?  Start     Ordered  ? 11/26/21 0845  Do not attempt resuscitation (DNR)  Continuous       ?Question Answer Comment  ?In the event of cardiac or respiratory ARREST Do not call a ?code blue?   ?In the event of cardiac or respiratory ARREST Do not perform Intubation, CPR, defibrillation or ACLS   ?In the event of cardiac or respiratory ARREST Use medication by any route, position, wound care, and other measures to relive pain and suffering. May use oxygen, suction and manual treatment of airway obstruction as needed for comfort.   ?Comments full medical care ok   ?  ? 11/26/21 0844  ? ?  ?  ? ?  ? ?Code Status History   ? ? Date Active Date Inactive Code Status Order ID Comments User Context  ? 12/02/2021 1721 11/26/2021 0844 Full Code [id number]  Lestine Mount, PA-C ED  ? 10/26/2021 1743 10/31/2021 0215 Full Code [id number]  Norval Morton, MD ED  ? 10/26/2021 1722 10/26/2021 1743 Partial Code [id number]  Norval Morton, MD ED  ? 07/15/2021 1549 07/21/2021 2105 Partial Code [id number]  Orma Flaming, MD ED  ? 03/26/2019 1341 03/27/2019 1327 Full  Code [id number]  Marcellus Scott Inpatient  ? 10/28/2018 1618 11/01/2018 1259 Full Code [id number]  Kristeen Miss, MD Inpatient  ? ?  ? ?Advance Directive Documentation   ? ?Flowsheet Row Most Recent Value  ?Type of Advance

## 2021-12-16 NOTE — Death Summary Note (Signed)
PCCM Progress Note ? ?Summary: ?76 yo male smoker presented from [place] to ER with severe epigastric pain, vomiting and dyspnea.  SpO2 in 80's on room air and hypotension.  He had received Folfox and nivolumab infusion for metastatic adenocarcinoma of the distal esophagus.   Concern initially that he had anaphylactic reaction to chemo.  He was started on solumedrol, pepcid, and benadryl.  Also started on antibiotics.  Later more likely felt to be that he had pneumonia as inciting even.  On 3/09 he required intubation and had bronchoscopy done.  On 3/10 he was started on ARDS protocol, nimbex, and prone positioning.  Palliative care consulted.  Family opted for DNR status on 3/12.  After family meeting with palliative care on 3/13, family then decided to not escalate care.  He was transitioned to comfort measures on 12-24-22 and had ventilator support removed.  He passed away at 13:26 on Dec 23, 2021. ? ?Cause of death: ?Community acquired bacterial multilobar pneumonia (POA) ? ?Final diagnoses: ?Acute hypoxia/hypercapnic respiratory failure with ARDS from multilobar bacterial pneumonia. ?Hx of COPD,  ?Hx of OSA ?Tobacco abuse. ?Septic shock from pneumonia. ?Acute metabolic encephalopathy 2nd to hypoxia/hypercapnia. ?Metastatic esophageal cancer. ?Paroxysmal atrial fibrillation (POA). ?Hx of CAD, HTN, HLD. 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 ?Anemia of critical illness and chronic disease. ?Thrombocytopenia. ?Moderate protein calorie malnutrition. ?Pressure injury, sacrum, stage 1, POA. ? ?Chesley Mires, MD ?[place] ?12/23/2021, 2:31 PM ? ? ? ?

## 2021-12-16 NOTE — Progress Notes (Signed)
Chaplain provided bereavement support to RJ's family after he passed.  They were gathered around him and telling stories.  At this time, they would like to just have some time together as family.  If needs arise before the family leaves, please page [place] at [phone] ? ?[place], Bcc ?Pager, [phone] ? ? ? 12-16-2021 1500  ?Clinical Encounter Type  ?Visited With Family  ?Visit Type Spiritual support  ?Spiritual Encounters  ?Spiritual Needs Grief support  ? ? ?

## 2021-12-16 DEATH — deceased

## 2021-12-20 ENCOUNTER — Ambulatory Visit: Payer: PPO | Admitting: Hematology and Oncology

## 2021-12-20 ENCOUNTER — Ambulatory Visit: Payer: PPO

## 2021-12-20 ENCOUNTER — Other Ambulatory Visit: Payer: PPO

## 2021-12-22 LAB — FUNGUS CULTURE WITH STAIN

## 2021-12-22 LAB — FUNGAL ORGANISM REFLEX

## 2021-12-22 LAB — FUNGUS CULTURE RESULT

## 2022-01-06 LAB — ACID FAST CULTURE WITH REFLEXED SENSITIVITIES (MYCOBACTERIA)
Acid Fast Culture: NEGATIVE
Acid Fast Culture: NEGATIVE

## 2022-03-27 ENCOUNTER — Ambulatory Visit: Payer: PPO | Admitting: Neurology

## 2022-04-03 ENCOUNTER — Ambulatory Visit: Payer: PPO | Admitting: Neurology

## 2023-01-30 IMAGING — DX DG PORTABLE PELVIS
1 series · 1 of 1 positions shown · non-contrast
Comparison: 10/20/2021

CLINICAL DATA: Found down.  Weakness

EXAM:
PORTABLE PELVIS 1-2 VIEWS

[pelvis ap]
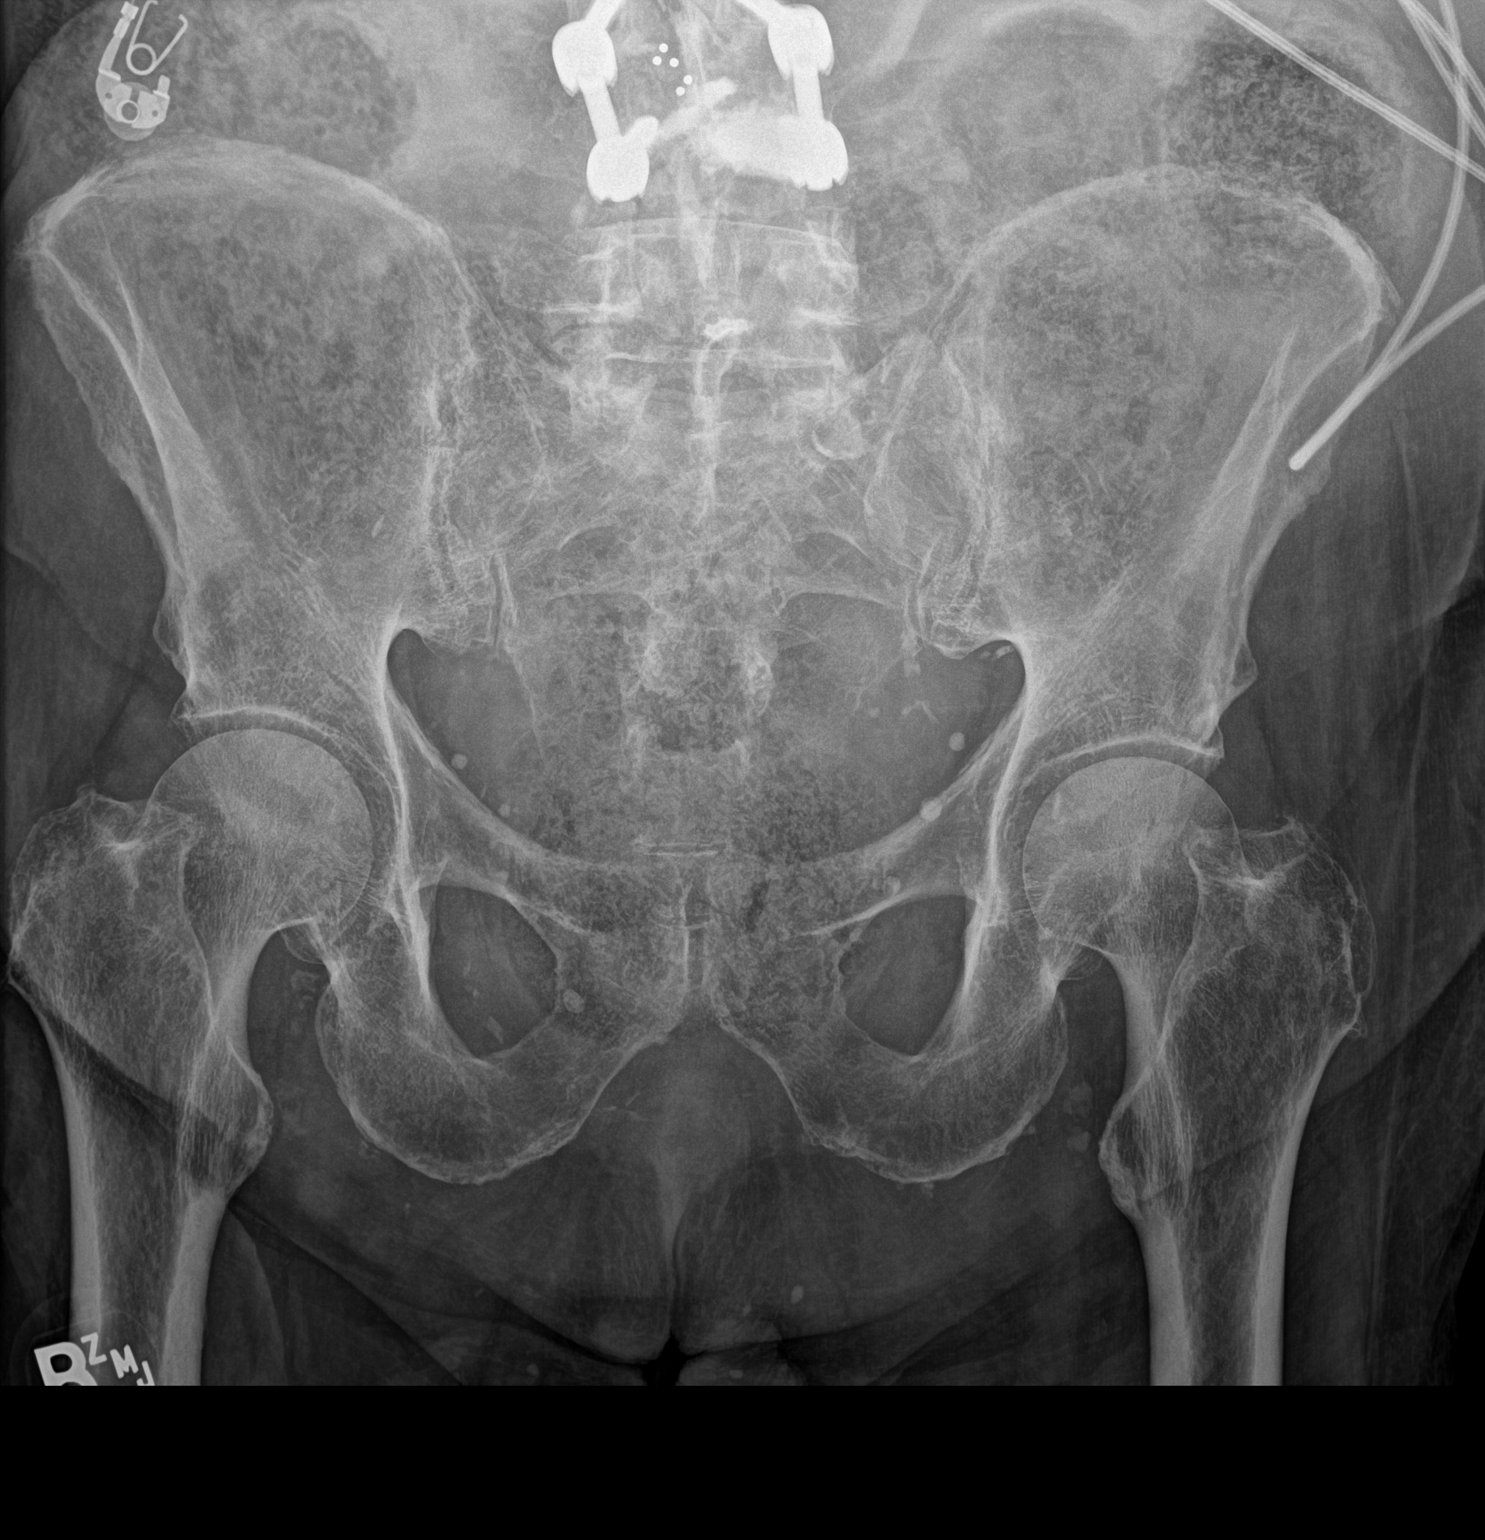

[1 of 1 positions shown; findings below may reference images not displayed]

FINDINGS: Bones appear demineralized. There is no evidence of pelvic fracture
or diastasis. No pelvic bone lesions are seen. Large volume of stool
projects throughout the visualized colon. Atherosclerotic vascular
calcifications are present.
IMPRESSION: Negative.

## 2023-01-30 IMAGING — DX DG CHEST 1V PORT
1 series · 1 of 1 positions shown · non-contrast
Comparison: CT examination dated October 20, 2021

CLINICAL DATA: Family found the patient on the floor, last seen
yesterday after chemotherapy when patient complained of weakness.
Patient does not remember falling.

EXAM:
PORTABLE CHEST 1 VIEW

[chest ap]
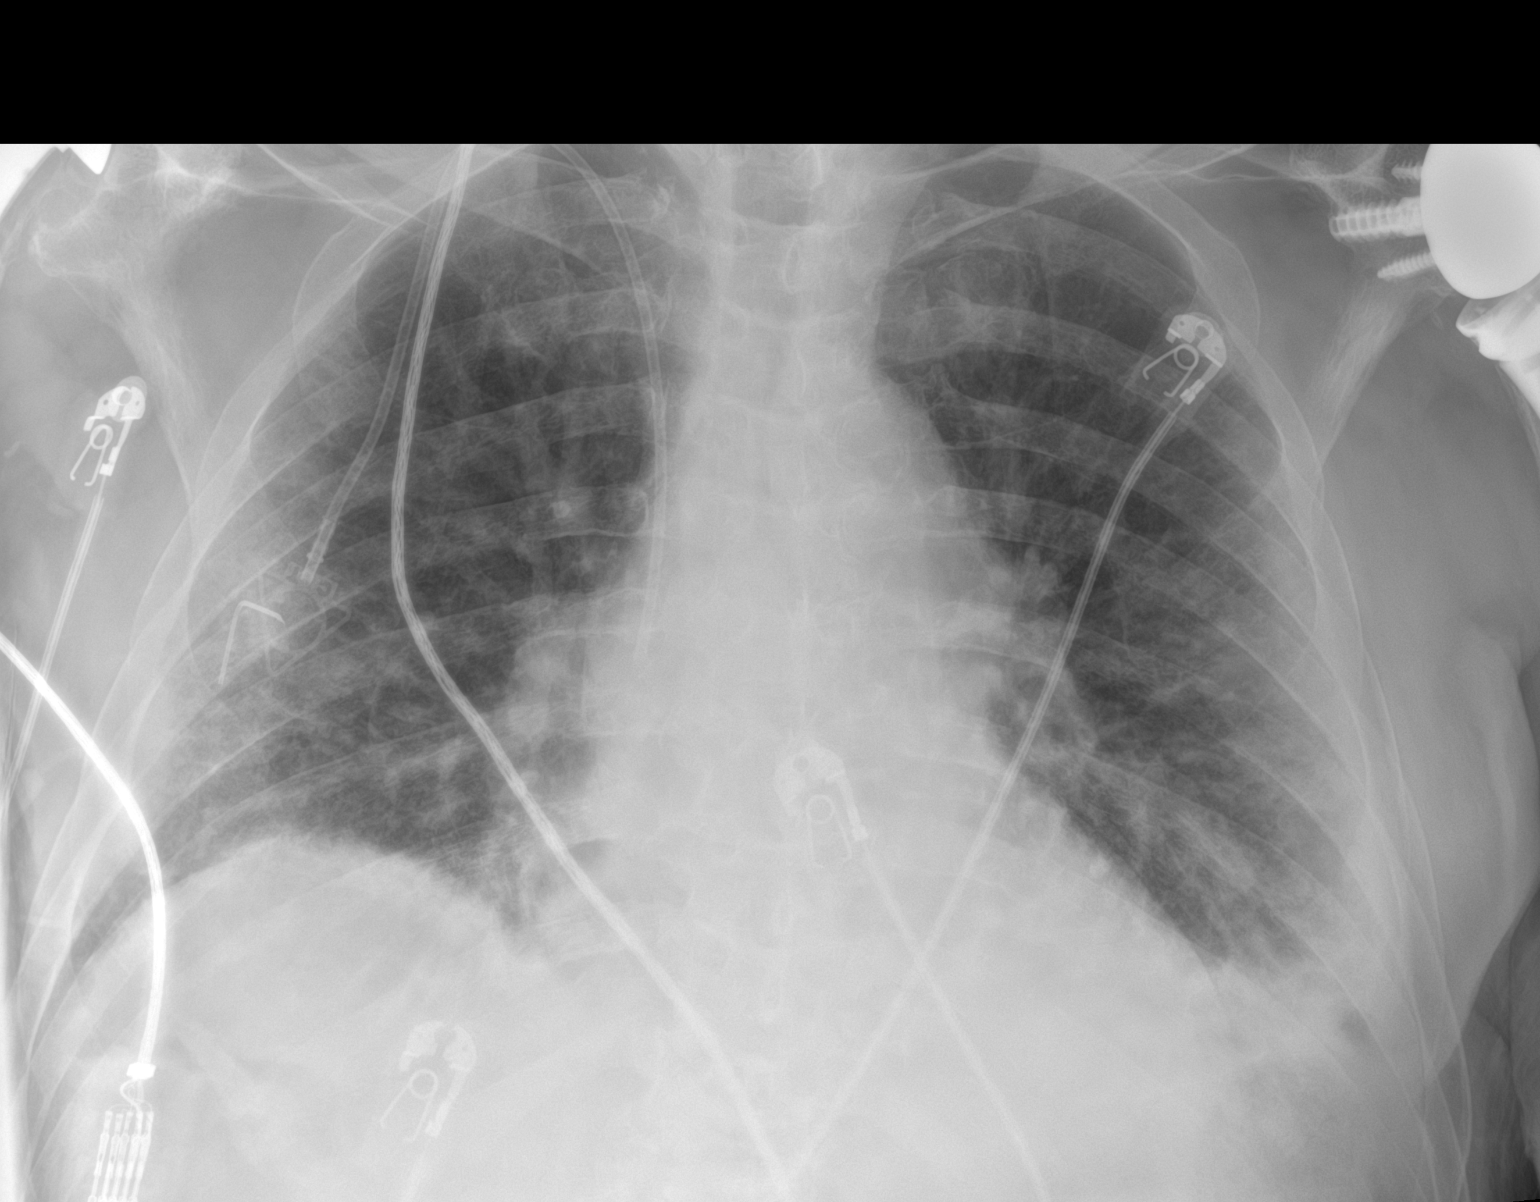

[1 of 1 positions shown; findings below may reference images not displayed]

FINDINGS: The heart is enlarged. There are bilateral lower lobe opacities,
left worse than the right concerning for pneumonia including
aspiration pneumonia. Bilateral shoulder arthroplasty. Right IJ
access MediPort with distal tip in the SVC.
IMPRESSION: 1. Stable cardiomegaly.
2. Bilateral lower lobe opacities, left worse than the right
concerning for pneumonia including aspiration pneumonia. Follow-up
examination to resolution is recommended.

## 2023-01-30 IMAGING — CT CT CHEST-ABD-PELV W/O CM
2 of 5 series · 11 of 46 positions shown, 12 images · non-contrast
Comparison: October 20, 2021.

CLINICAL DATA: 75-year-old male presents for evaluation following
fall. Patient also with history of esophageal neoplasm.



[Series 7: cap wo 5.0 i31f 2 (person_name) · axial · 0.98mm/px · z∈[-772,-237]mm · 8 of 135 slices shown, 9 images]
[im 14/135  soft-tissue]
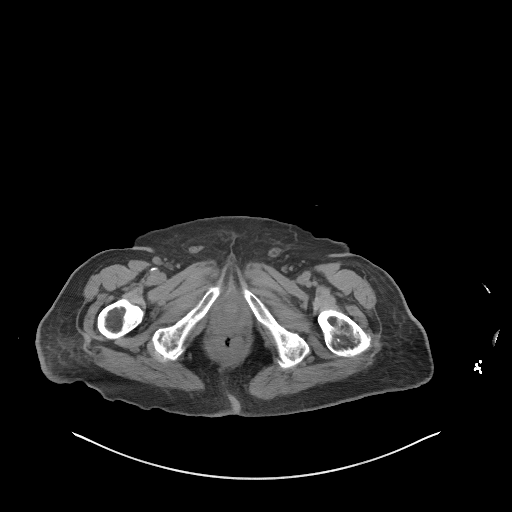
[im 14/135  bone]
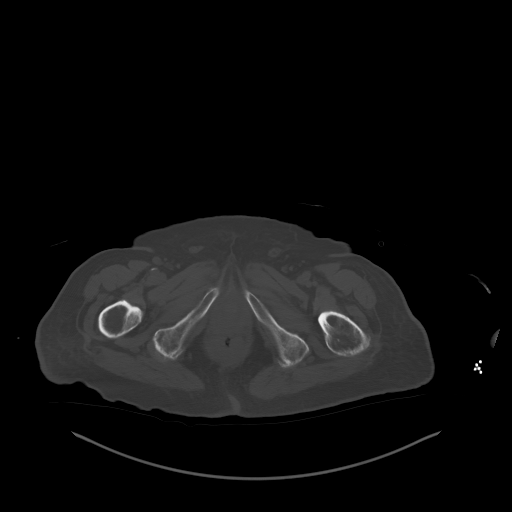
[im 27/135  soft-tissue]
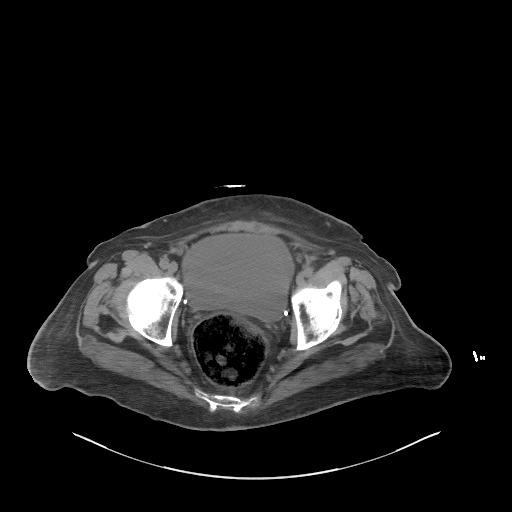
[im 41/135  soft-tissue]
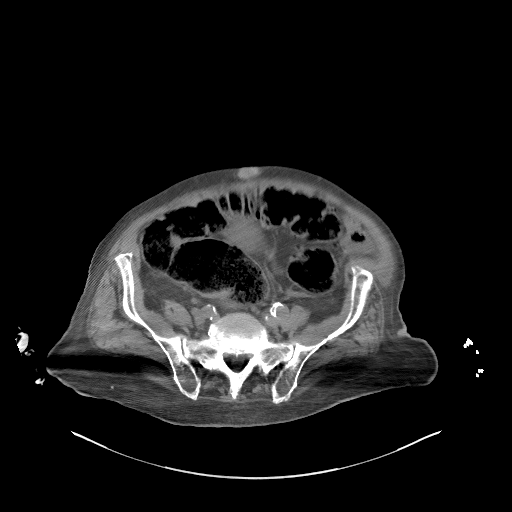
[im 54/135  soft-tissue]
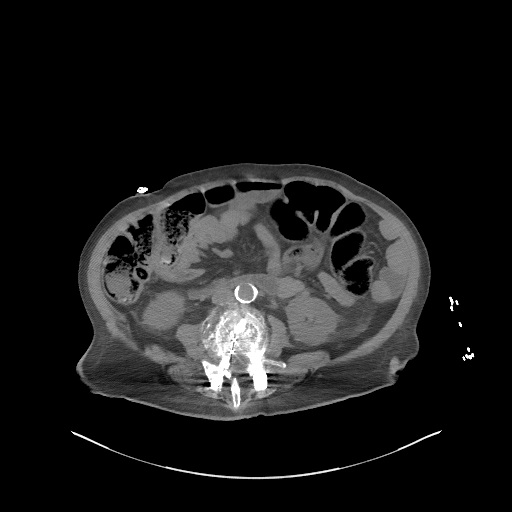
[im 81/135  soft-tissue]
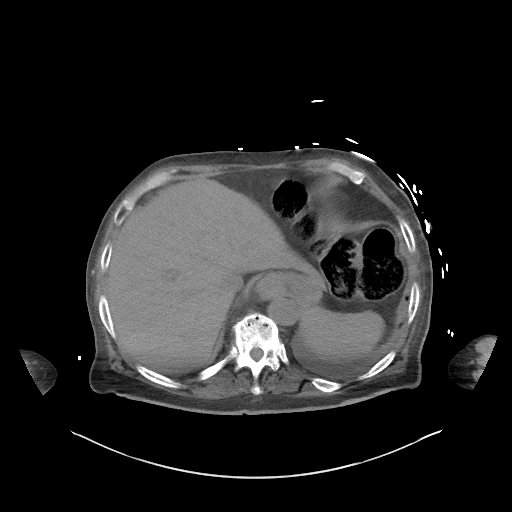
[im 94/135  soft-tissue]
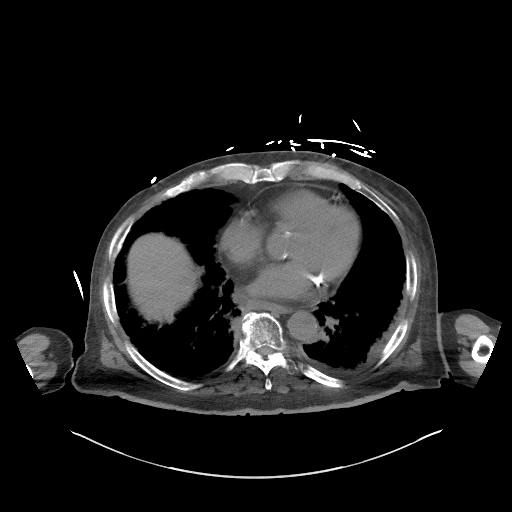
[im 108/135  soft-tissue]
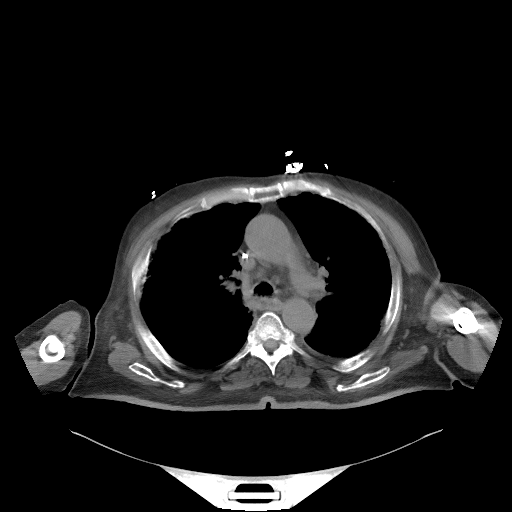
[im 121/135  soft-tissue]
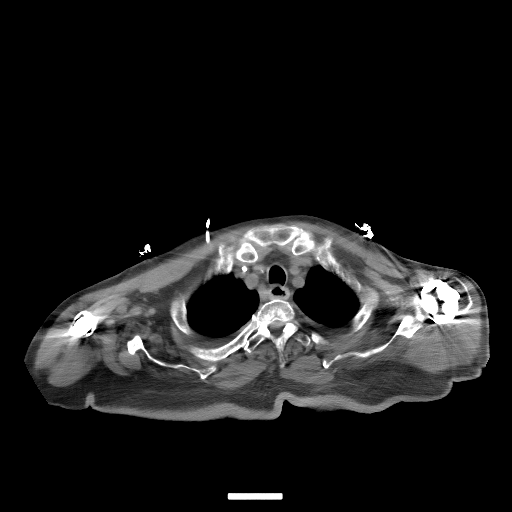

[Series 8: coronal · coronal · 0.76mm/px · 3 of 152 slices shown]
[im 51/152  soft-tissue]
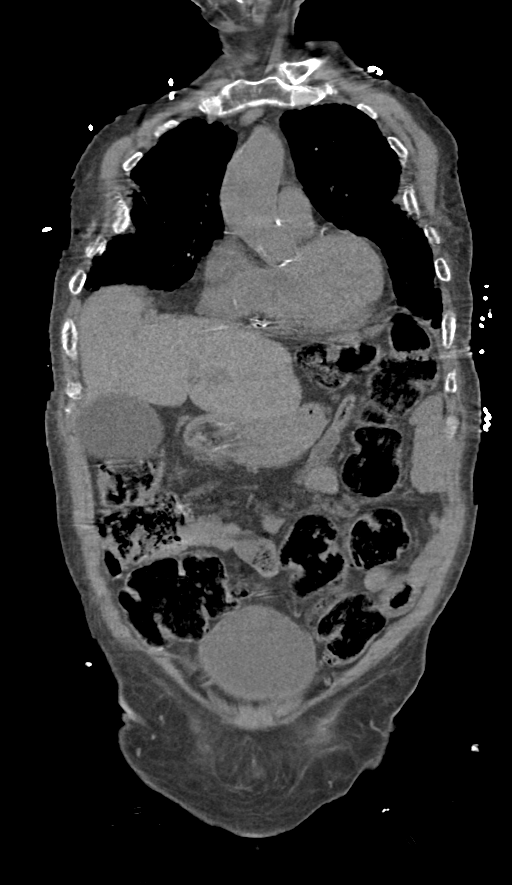
[im 68/152  soft-tissue]
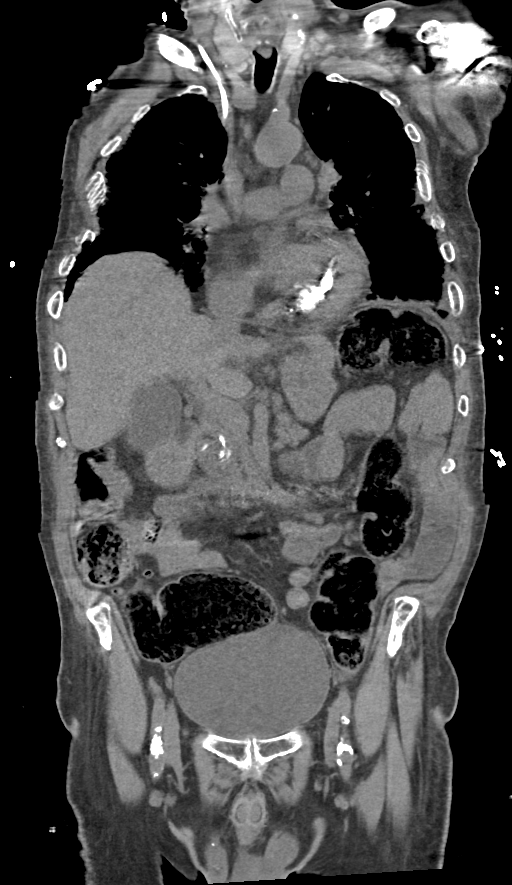
[im 84/152  soft-tissue]
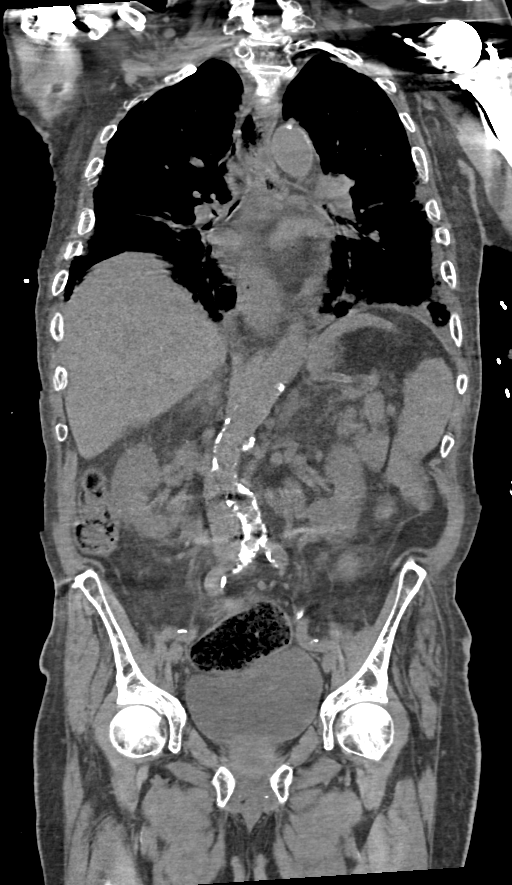

[11 of 46 positions shown; findings below may reference images not displayed]

FINDINGS: CT CHEST FINDINGS

Cardiovascular: The aorta shows smooth contours without adjacent
stranding. No mediastinal hematoma. Aortic dilation to approximately
4 cm greatest axial dimension is unchanged. Generalized aortic
atherosclerosis. Signs of mitral annular calcification. Three-vessel
coronary artery disease. No substantial pericardial effusion, stable
small pericardial effusion when compared to previous imaging from
just 6 days ago

RIGHT-sided Port-A-Cath terminates at the caval to atrial junction.

Mediastinum/Nodes: Patulous esophagus similar to the prior exam.
Mild thickening of the distal esophagus also unchanged in this
patient with known history of esophageal neoplasm. No mediastinal
hematoma or adenopathy. No axillary or thoracic inlet adenopathy.

Lungs/Pleura: No pneumothorax. Reticular opacities and nodular
changes along the pleural surface with increasing conspicuity since
previous imaging. Pleural based airspace disease with nodular
features at the lung bases and along the anterior chest, these
findings have developed since the previous exam of just 6 days ago.
No pneumothorax. Material and the trachea and material layering in
the mainstem bronchi extending into peripheral bronchi with signs of
bronchial wall thickening.

Musculoskeletal: See below for full musculoskeletal details.

CT ABDOMEN PELVIS FINDINGS

Hepatobiliary: Liver with smooth contours. Trace fluid adjacent to
medial RIGHT hemi liver similar to previous imaging. No gross signs
of hepatic trauma. No Peri hepatic fluid. No pericholecystic
stranding.

Pancreas: Pancreatic atrophy without signs of adjacent inflammation.

Spleen: Trace stranding adjacent to the spleen. Minimal fluid
adjacent to the spleen is similar to the study October 20, 2021.
No gross splenic abnormality.

Adrenals/Urinary Tract: Mild bilateral adrenal thickening.

Perinephric stranding bilaterally perhaps slightly increased
compared to previous imaging. Generalized increase in fascial
thickening and edema albeit mild since the previous study.

No hydronephrosis.  No perinephric hematoma.

Stomach/Bowel: Small hiatal hernia and distal esophageal thickening
as described.

No signs of bowel dilation to suggest obstruction of the small
bowel. The appendix is normal.

Colon is stool filled without signs of adjacent stranding. No signs
of pneumatosis. No pneumoperitoneum. No pelvic fluid.

Vascular/Lymphatic:

Aortic atherosclerosis. No sign of aneurysm. Smooth contour of the
IVC. There is no gastrohepatic or hepatoduodenal ligament
lymphadenopathy. No retroperitoneal or mesenteric lymphadenopathy.

No pelvic sidewall lymphadenopathy.

Atherosclerosis is moderate to marked. Vascular structures not well
assessed given the lack of intravenous contrast. No focal stranding
or hematoma adjacent to the abdominal aorta or pelvic vessels.

Reproductive: Unremarkable by CT. Urinary bladder is moderately
distended extending to the sacral promontory.

Other: No fluid in the pelvis. Trace fluid about the liver and
spleen similar to recent comparison imaging.

Musculoskeletal: No displaced rib fractures. Mildly limited
assessment due to respiratory motion. Bony pelvis without signs of
fracture. Extensive spinal degenerative changes and osteopenia. Post
bilateral shoulder arthroplasty. Visualized clavicles and scapulae
are intact.

Signs of prior L3-L4 spinal fusion and cement augmentation at L4
showing no change. Spinal compression fractures with similar
appearance at the T11-T12 level based on recent comparison imaging
with approximally 30% loss of height at these levels. Spinal
alignment is unchanged. Sternum grossly intact with some artifact
crossing the anterior sternum due to motion.
IMPRESSION: 1. No evidence of acute traumatic injury to the chest, abdomen or
pelvis.
2. Peripheral nodular parenchymal consolidative changes with marked
worsening since the recent comparison imaging potentially post/drug
treatment related given pattern seen and rapid interval progression.
Correlate with any respiratory symptoms. Findings are felt to have
an overlay of acute inflammatory changes related to aspiration given
findings in the bronchi. However, subpleural changes extend along
the anterior chest which would be atypical for purely aspiration
related changes.
3. Small hiatal hernia and distal esophageal thickening also
unchanged in this patient with known history of esophageal neoplasm.
4. Mild generalized edema with perinephric stranding and fascial
edema in the abdomen not substantially changed compared to recent
imaging.
5. Unchanged appearance of T11-T12 compression fractures.
6. Aortic atherosclerosis. Three-vessel coronary artery disease.
7. Ascending thoracic aortic aneurysm. Recommend annual imaging
followup by CTA or MRA. This recommendation follows 7121
ACCF/AHA/AATS/ACR/ASA/SCA/JIMMY/SIMPSON/ANDREZ/RENE ROBERTO Guidelines for the
Diagnosis and Management of Patients with Thoracic Aortic Disease.
Circulation. 7121; 121: [id number]. Aortic aneurysm NOS (04Y4S-L3S.Q)

Aortic Atherosclerosis (04Y4S-MTY.Y).

## 2023-01-30 IMAGING — CT CT CERVICAL SPINE W/O CM
3 of 4 series · 12 of 35 positions shown, 14 images · non-contrast
Comparison: None.

CLINICAL DATA: Moderate to severe head trauma



[Series 5: c_spine 2.0 st · axial · 0.33mm/px · z∈[-205,-85]mm · 4 of 92 slices shown, 5 images]
[im 16/92  soft-tissue]
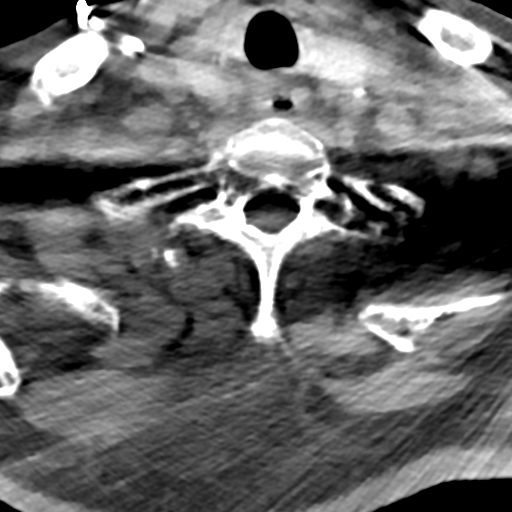
[im 16/92  bone]
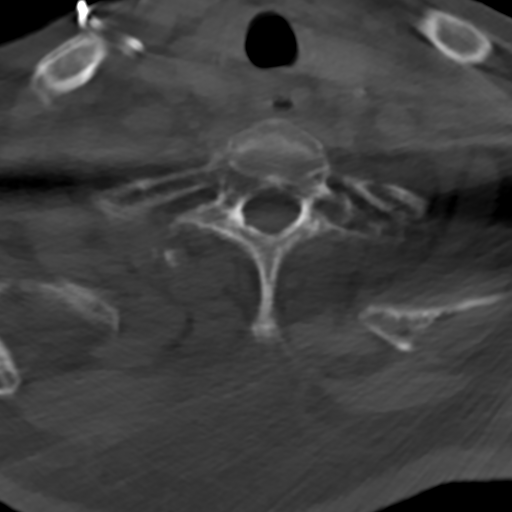
[im 31/92  bone]
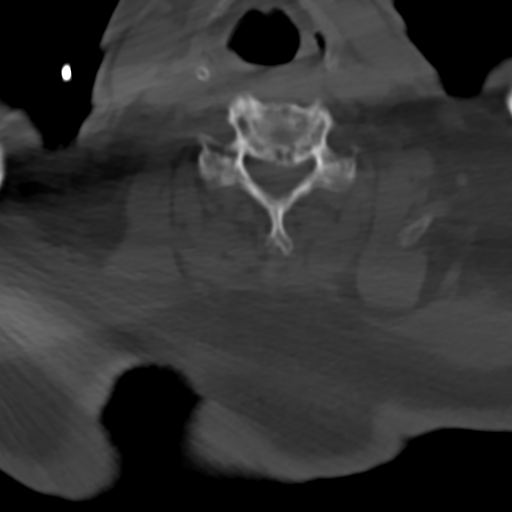
[im 61/92  bone]
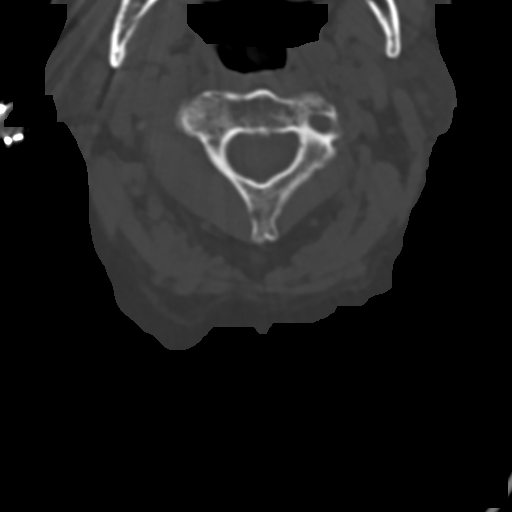
[im 76/92  bone]
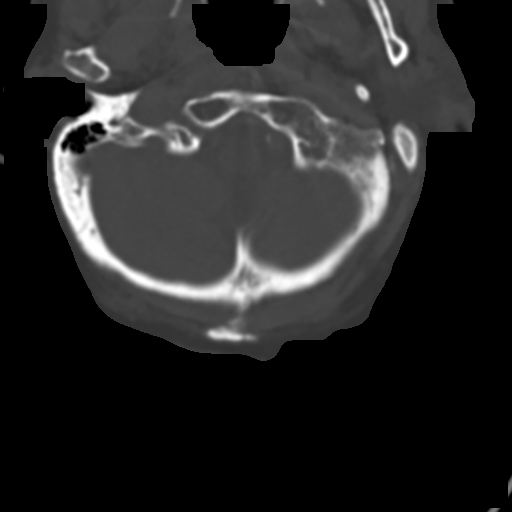

[Series 8: coronal bone · coronal · 0.25mm/px · 3 of 61 slices shown]
[im 13/61  bone]
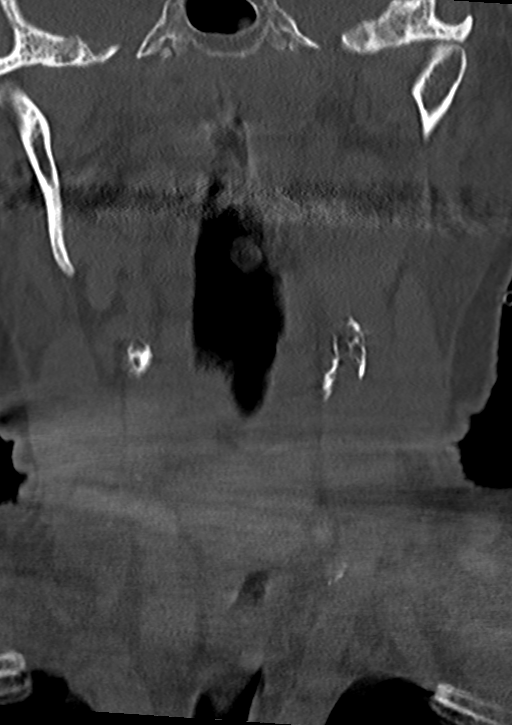
[im 25/61  bone]
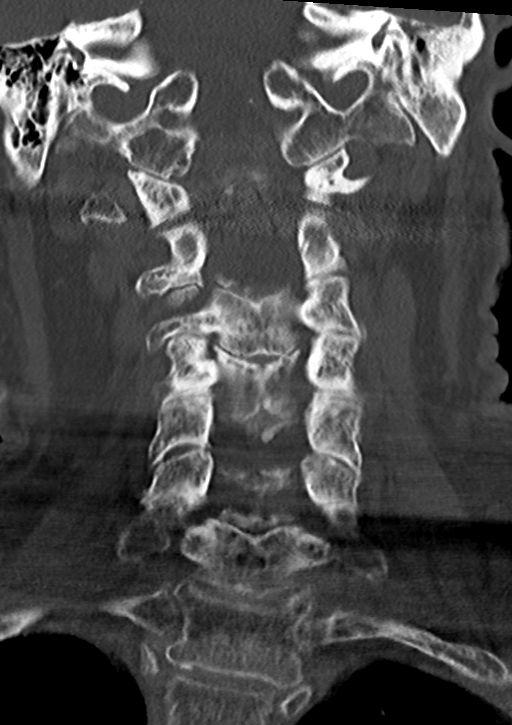
[im 37/61  bone]
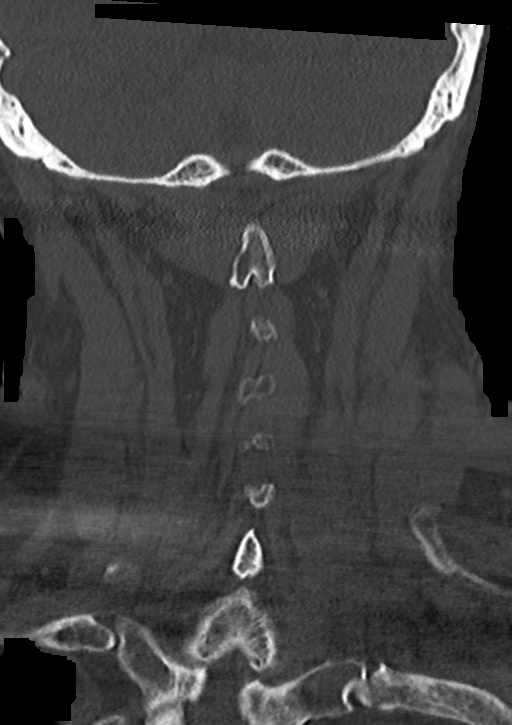

[Series 9: sagittal bone · sagittal · 0.27mm/px · 5 of 61 slices shown, 6 images]
[im 21/61  bone]
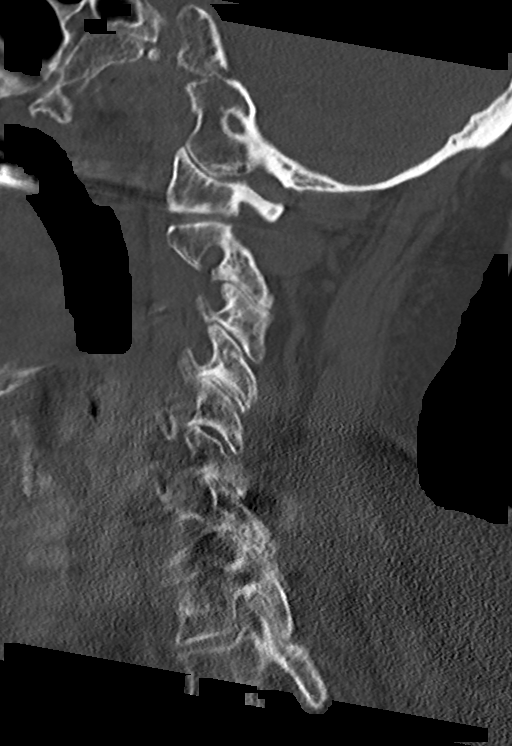
[im 26/61  bone]
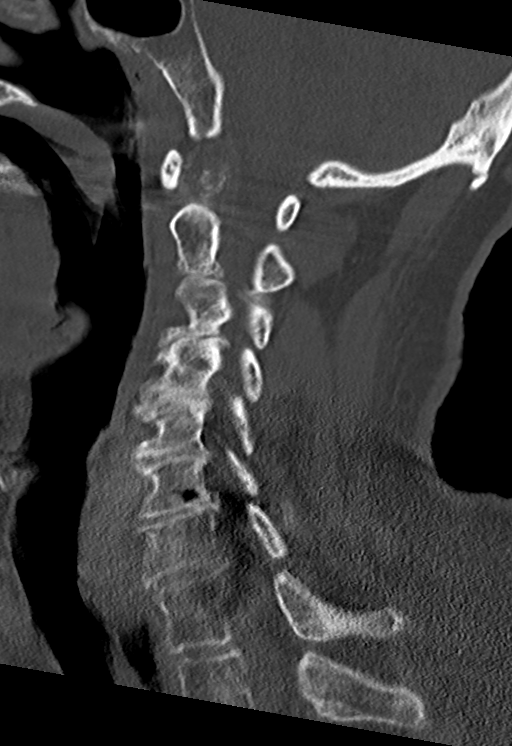
[im 31/61  soft-tissue]
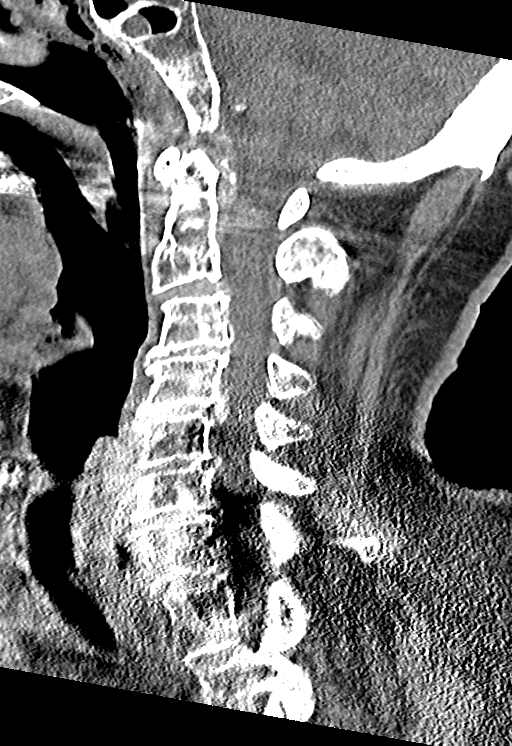
[im 31/61  bone]
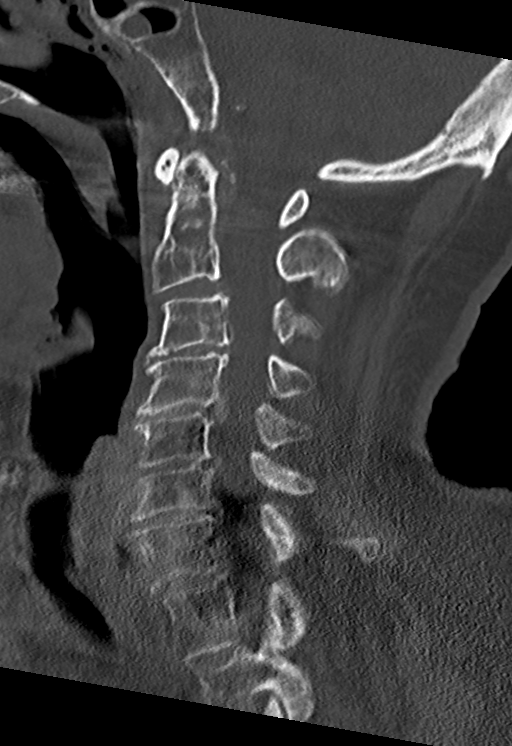
[im 36/61  bone]
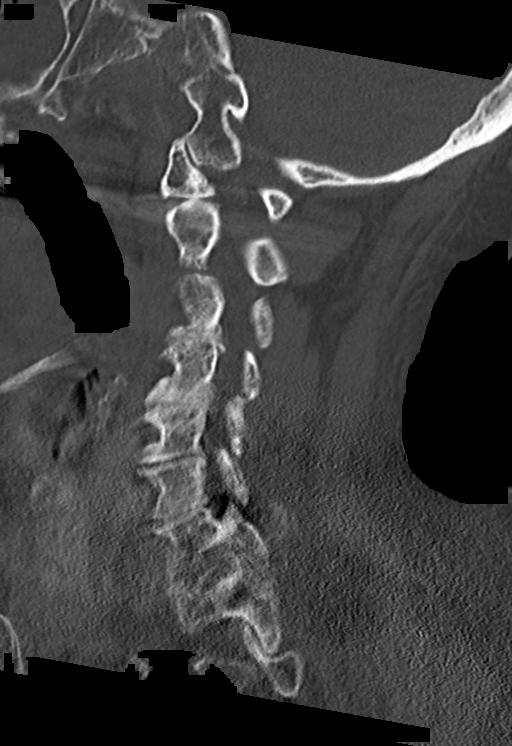
[im 41/61  bone]
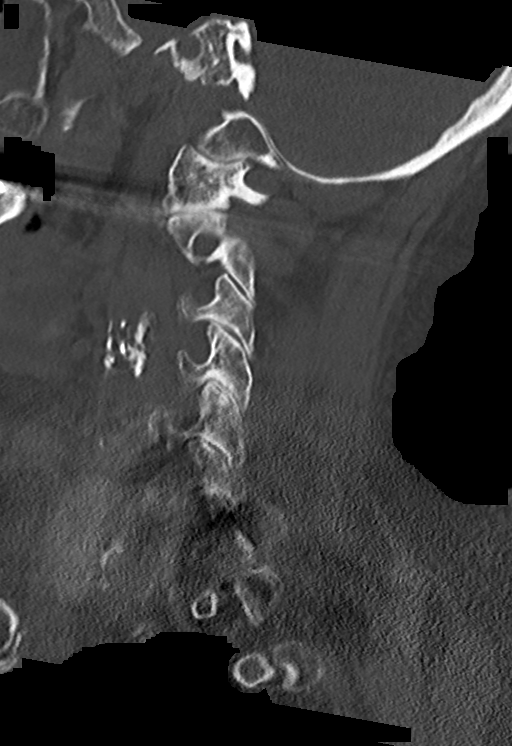

[12 of 35 positions shown; findings below may reference images not displayed]

FINDINGS: CT HEAD FINDINGS

Brain: No evidence of acute infarction, hemorrhage, hydrocephalus,
extra-axial collection or mass lesion/mass effect. Chronic small
vessel ischemia in the hemispheric white matter. Cerebral volume
loss, central predominant. Small, incidental calcification in the
inter hemispheric fissure.

Vascular: No hyperdense vessel or unexpected calcification.

Skull: No acute fracture.

Sinuses/Orbits: Bilateral cataract resection. Fluid levels that are
low-density in the left maxillary and sphenoid sinuses. Left mastoid
and middle ear opacification that is subtotal.

CT CERVICAL SPINE FINDINGS

Alignment: Reversal of cervical lordosis.

Skull base and vertebrae: Streak artifact intermittent motion
affects visualization, streak artifact heavily affecting the C1 and
C2 levels. No convincing fracture. Generalized osteopenia.

Soft tissues and spinal canal: No prevertebral fluid or swelling. No
visible canal hematoma.

Disc levels: Generalized degenerative disc narrowing and ridging.
Multilevel facet spurring.

Upper chest: Negative
IMPRESSION: 1. No evidence of acute intracranial injury. Negative for cervical
spine fracture.
2. Left-sided sinusitis with fluid levels. Left mastoid and middle
ear opacification.
3. Chronic small vessel ischemia in the hemispheric white matter.
